# Patient Record
Sex: Female | Born: 1965 | Race: Black or African American | Hispanic: No | Marital: Single | Smoking: Former smoker
Health system: Southern US, Community
[De-identification: ages and names within clinical notes are randomized; demographics above are authoritative.]

## PROBLEM LIST (undated history)

## (undated) DIAGNOSIS — Z940 Kidney transplant status: Secondary | ICD-10-CM

## (undated) DIAGNOSIS — N19 Unspecified kidney failure: Secondary | ICD-10-CM

## (undated) DIAGNOSIS — Z5189 Encounter for other specified aftercare: Secondary | ICD-10-CM

## (undated) DIAGNOSIS — I10 Essential (primary) hypertension: Secondary | ICD-10-CM

## (undated) DIAGNOSIS — K219 Gastro-esophageal reflux disease without esophagitis: Secondary | ICD-10-CM

## (undated) DIAGNOSIS — I509 Heart failure, unspecified: Secondary | ICD-10-CM

## (undated) HISTORY — DX: Unspecified kidney failure: N19

## (undated) HISTORY — PX: COLONOSCOPY: SHX174

## (undated) HISTORY — DX: Kidney transplant status: Z94.0

## (undated) HISTORY — DX: Encounter for other specified aftercare: Z51.89

## (undated) HISTORY — DX: Gastro-esophageal reflux disease without esophagitis: K21.9

## (undated) HISTORY — PX: DG AV DIALYSIS  SHUNT ACCESS EXIST*L* OR: HXRAD910

## (undated) HISTORY — PX: UMBILICAL HERNIA REPAIR: SHX196

## (undated) HISTORY — PX: CHOLECYSTECTOMY: SHX55

---

## 1997-11-01 ENCOUNTER — Other Ambulatory Visit: Admission: RE | Admit: 1997-11-01 | Discharge: 1997-11-01 | Payer: Self-pay | Admitting: *Deleted

## 1997-11-03 ENCOUNTER — Other Ambulatory Visit: Admission: RE | Admit: 1997-11-03 | Discharge: 1997-11-03 | Payer: Self-pay | Admitting: *Deleted

## 1997-12-10 ENCOUNTER — Other Ambulatory Visit: Admission: RE | Admit: 1997-12-10 | Discharge: 1997-12-10 | Payer: Self-pay | Admitting: *Deleted

## 1998-01-12 ENCOUNTER — Other Ambulatory Visit: Admission: RE | Admit: 1998-01-12 | Discharge: 1998-01-12 | Payer: Self-pay | Admitting: *Deleted

## 1998-01-26 ENCOUNTER — Other Ambulatory Visit: Admission: RE | Admit: 1998-01-26 | Discharge: 1998-01-26 | Payer: Self-pay | Admitting: *Deleted

## 1998-07-19 ENCOUNTER — Ambulatory Visit (HOSPITAL_COMMUNITY): Admission: RE | Admit: 1998-07-19 | Discharge: 1998-07-19 | Payer: Self-pay | Admitting: Nephrology

## 1998-07-19 ENCOUNTER — Encounter: Payer: Self-pay | Admitting: Nephrology

## 2000-01-03 ENCOUNTER — Emergency Department (HOSPITAL_COMMUNITY): Admission: EM | Admit: 2000-01-03 | Discharge: 2000-01-03 | Payer: Self-pay | Admitting: Emergency Medicine

## 2000-01-03 ENCOUNTER — Encounter: Payer: Self-pay | Admitting: Emergency Medicine

## 2002-02-25 ENCOUNTER — Encounter (INDEPENDENT_AMBULATORY_CARE_PROVIDER_SITE_OTHER): Payer: Self-pay | Admitting: *Deleted

## 2002-02-25 ENCOUNTER — Encounter: Payer: Self-pay | Admitting: Emergency Medicine

## 2002-02-26 ENCOUNTER — Inpatient Hospital Stay (HOSPITAL_COMMUNITY): Admission: EM | Admit: 2002-02-26 | Discharge: 2002-02-28 | Payer: Self-pay | Admitting: Emergency Medicine

## 2002-02-26 ENCOUNTER — Encounter: Payer: Self-pay | Admitting: Nephrology

## 2002-12-10 ENCOUNTER — Encounter: Admission: RE | Admit: 2002-12-10 | Discharge: 2002-12-10 | Payer: Self-pay | Admitting: Nephrology

## 2002-12-10 ENCOUNTER — Encounter: Payer: Self-pay | Admitting: Nephrology

## 2003-02-04 ENCOUNTER — Encounter (INDEPENDENT_AMBULATORY_CARE_PROVIDER_SITE_OTHER): Payer: Self-pay | Admitting: Specialist

## 2003-02-04 ENCOUNTER — Ambulatory Visit (HOSPITAL_COMMUNITY): Admission: RE | Admit: 2003-02-04 | Discharge: 2003-02-06 | Payer: Self-pay | Admitting: General Surgery

## 2003-02-04 ENCOUNTER — Encounter: Payer: Self-pay | Admitting: General Surgery

## 2003-08-22 ENCOUNTER — Encounter: Admission: RE | Admit: 2003-08-22 | Discharge: 2003-08-22 | Payer: Self-pay | Admitting: Nephrology

## 2003-08-25 ENCOUNTER — Encounter: Admission: RE | Admit: 2003-08-25 | Discharge: 2003-08-25 | Payer: Self-pay | Admitting: Nephrology

## 2003-09-02 ENCOUNTER — Ambulatory Visit (HOSPITAL_COMMUNITY): Admission: RE | Admit: 2003-09-02 | Discharge: 2003-09-02 | Payer: Self-pay | Admitting: Obstetrics

## 2003-11-25 ENCOUNTER — Encounter: Admission: RE | Admit: 2003-11-25 | Discharge: 2003-11-25 | Payer: Self-pay | Admitting: Nephrology

## 2003-12-28 ENCOUNTER — Ambulatory Visit (HOSPITAL_COMMUNITY): Admission: RE | Admit: 2003-12-28 | Discharge: 2003-12-28 | Payer: Self-pay | Admitting: Nephrology

## 2004-12-26 ENCOUNTER — Encounter: Admission: RE | Admit: 2004-12-26 | Discharge: 2004-12-26 | Payer: Self-pay | Admitting: Nephrology

## 2005-07-29 ENCOUNTER — Ambulatory Visit (HOSPITAL_COMMUNITY): Admission: RE | Admit: 2005-07-29 | Discharge: 2005-07-29 | Payer: Self-pay | Admitting: Vascular Surgery

## 2006-06-02 ENCOUNTER — Ambulatory Visit (HOSPITAL_COMMUNITY): Admission: RE | Admit: 2006-06-02 | Discharge: 2006-06-02 | Payer: Self-pay | Admitting: Nephrology

## 2006-07-08 HISTORY — PX: CHOLECYSTECTOMY: SHX55

## 2006-08-08 ENCOUNTER — Ambulatory Visit (HOSPITAL_COMMUNITY): Admission: RE | Admit: 2006-08-08 | Discharge: 2006-08-08 | Payer: Self-pay | Admitting: Vascular Surgery

## 2006-08-08 ENCOUNTER — Ambulatory Visit: Payer: Self-pay | Admitting: Vascular Surgery

## 2006-08-08 ENCOUNTER — Encounter (INDEPENDENT_AMBULATORY_CARE_PROVIDER_SITE_OTHER): Payer: Self-pay | Admitting: Specialist

## 2006-12-22 ENCOUNTER — Encounter: Admission: RE | Admit: 2006-12-22 | Discharge: 2006-12-22 | Payer: Self-pay | Admitting: Nephrology

## 2007-02-21 ENCOUNTER — Inpatient Hospital Stay (HOSPITAL_COMMUNITY): Admission: EM | Admit: 2007-02-21 | Discharge: 2007-02-24 | Payer: Self-pay | Admitting: Emergency Medicine

## 2007-02-21 ENCOUNTER — Ambulatory Visit: Payer: Self-pay | Admitting: Cardiology

## 2007-02-22 ENCOUNTER — Encounter: Payer: Self-pay | Admitting: Nephrology

## 2007-02-23 ENCOUNTER — Encounter (INDEPENDENT_AMBULATORY_CARE_PROVIDER_SITE_OTHER): Payer: Self-pay | Admitting: Nephrology

## 2007-02-23 ENCOUNTER — Ambulatory Visit: Payer: Self-pay | Admitting: Vascular Surgery

## 2007-03-05 ENCOUNTER — Inpatient Hospital Stay (HOSPITAL_COMMUNITY): Admission: EM | Admit: 2007-03-05 | Discharge: 2007-03-07 | Payer: Self-pay | Admitting: Emergency Medicine

## 2007-03-06 ENCOUNTER — Encounter (INDEPENDENT_AMBULATORY_CARE_PROVIDER_SITE_OTHER): Payer: Self-pay | Admitting: Gastroenterology

## 2007-04-29 ENCOUNTER — Ambulatory Visit: Payer: Self-pay | Admitting: Vascular Surgery

## 2007-05-11 ENCOUNTER — Ambulatory Visit: Payer: Self-pay | Admitting: Vascular Surgery

## 2007-05-11 ENCOUNTER — Ambulatory Visit (HOSPITAL_COMMUNITY): Admission: RE | Admit: 2007-05-11 | Discharge: 2007-05-11 | Payer: Self-pay | Admitting: Vascular Surgery

## 2007-07-09 HISTORY — PX: UMBILICAL HERNIA REPAIR: SHX196

## 2007-08-14 ENCOUNTER — Encounter (HOSPITAL_COMMUNITY): Admission: RE | Admit: 2007-08-14 | Discharge: 2007-11-12 | Payer: Self-pay | Admitting: Nephrology

## 2007-09-24 ENCOUNTER — Ambulatory Visit (HOSPITAL_COMMUNITY): Admission: RE | Admit: 2007-09-24 | Discharge: 2007-09-24 | Payer: Self-pay | Admitting: Surgery

## 2007-09-24 ENCOUNTER — Ambulatory Visit: Payer: Self-pay | Admitting: Vascular Surgery

## 2007-11-02 ENCOUNTER — Ambulatory Visit (HOSPITAL_COMMUNITY): Admission: RE | Admit: 2007-11-02 | Discharge: 2007-11-02 | Payer: Self-pay | Admitting: *Deleted

## 2007-11-02 ENCOUNTER — Ambulatory Visit: Payer: Self-pay | Admitting: *Deleted

## 2007-11-10 ENCOUNTER — Emergency Department (HOSPITAL_COMMUNITY): Admission: EM | Admit: 2007-11-10 | Discharge: 2007-11-10 | Payer: Self-pay | Admitting: Emergency Medicine

## 2007-11-27 ENCOUNTER — Ambulatory Visit (HOSPITAL_COMMUNITY): Admission: RE | Admit: 2007-11-27 | Discharge: 2007-11-27 | Payer: Self-pay | Admitting: Nephrology

## 2007-12-18 ENCOUNTER — Ambulatory Visit (HOSPITAL_COMMUNITY): Admission: RE | Admit: 2007-12-18 | Discharge: 2007-12-18 | Payer: Self-pay | Admitting: Vascular Surgery

## 2008-01-20 ENCOUNTER — Ambulatory Visit: Payer: Self-pay | Admitting: Vascular Surgery

## 2008-02-05 ENCOUNTER — Ambulatory Visit: Payer: Self-pay | Admitting: Vascular Surgery

## 2008-02-05 ENCOUNTER — Ambulatory Visit (HOSPITAL_COMMUNITY): Admission: RE | Admit: 2008-02-05 | Discharge: 2008-02-05 | Payer: Self-pay | Admitting: Vascular Surgery

## 2008-02-10 ENCOUNTER — Ambulatory Visit (HOSPITAL_COMMUNITY): Admission: RE | Admit: 2008-02-10 | Discharge: 2008-02-10 | Payer: Self-pay | Admitting: Vascular Surgery

## 2008-04-11 ENCOUNTER — Ambulatory Visit (HOSPITAL_COMMUNITY): Admission: RE | Admit: 2008-04-11 | Discharge: 2008-04-11 | Payer: Self-pay | Admitting: Vascular Surgery

## 2008-04-11 ENCOUNTER — Ambulatory Visit: Payer: Self-pay | Admitting: *Deleted

## 2009-04-07 ENCOUNTER — Ambulatory Visit (HOSPITAL_COMMUNITY): Admission: RE | Admit: 2009-04-07 | Discharge: 2009-04-07 | Payer: Self-pay | Admitting: Nephrology

## 2009-05-03 ENCOUNTER — Ambulatory Visit: Payer: Self-pay | Admitting: Vascular Surgery

## 2009-05-17 ENCOUNTER — Ambulatory Visit (HOSPITAL_COMMUNITY): Admission: RE | Admit: 2009-05-17 | Discharge: 2009-05-17 | Payer: Self-pay | Admitting: Vascular Surgery

## 2009-05-22 ENCOUNTER — Ambulatory Visit: Payer: Self-pay | Admitting: Vascular Surgery

## 2009-05-22 ENCOUNTER — Ambulatory Visit (HOSPITAL_COMMUNITY): Admission: RE | Admit: 2009-05-22 | Discharge: 2009-05-22 | Payer: Self-pay | Admitting: Vascular Surgery

## 2009-07-08 HISTORY — PX: KIDNEY TRANSPLANT: SHX239

## 2009-07-17 ENCOUNTER — Ambulatory Visit (HOSPITAL_COMMUNITY): Admission: RE | Admit: 2009-07-17 | Discharge: 2009-07-17 | Payer: Self-pay | Admitting: Vascular Surgery

## 2009-07-17 ENCOUNTER — Ambulatory Visit: Payer: Self-pay | Admitting: Vascular Surgery

## 2009-08-28 ENCOUNTER — Ambulatory Visit (HOSPITAL_COMMUNITY): Admission: RE | Admit: 2009-08-28 | Discharge: 2009-08-28 | Payer: Self-pay | Admitting: Nephrology

## 2009-09-15 ENCOUNTER — Ambulatory Visit (HOSPITAL_COMMUNITY): Admission: RE | Admit: 2009-09-15 | Discharge: 2009-09-15 | Payer: Self-pay | Admitting: Nephrology

## 2010-04-07 DIAGNOSIS — Z940 Kidney transplant status: Secondary | ICD-10-CM

## 2010-04-07 HISTORY — DX: Kidney transplant status: Z94.0

## 2010-07-28 ENCOUNTER — Encounter: Payer: Self-pay | Admitting: Nephrology

## 2010-07-29 ENCOUNTER — Encounter: Payer: Self-pay | Admitting: Nephrology

## 2010-10-10 LAB — CBC
HCT: 35.8 % — ABNORMAL LOW (ref 36.0–46.0)
Platelets: 214 10*3/uL (ref 150–400)
RBC: 3.44 MIL/uL — ABNORMAL LOW (ref 3.87–5.11)
WBC: 7.4 10*3/uL (ref 4.0–10.5)

## 2010-10-10 LAB — POCT I-STAT 4, (NA,K, GLUC, HGB,HCT)
Glucose, Bld: 84 mg/dL (ref 70–99)
Sodium: 136 mEq/L (ref 135–145)

## 2010-10-10 LAB — BASIC METABOLIC PANEL
Calcium: 10 mg/dL (ref 8.4–10.5)
GFR calc Af Amer: 6 mL/min — ABNORMAL LOW (ref >60)
Potassium: 4 mEq/L (ref 3.5–5.1)

## 2010-10-10 LAB — PROTIME-INR: Prothrombin Time: 13 seconds (ref 11.6–15.2)

## 2010-10-10 LAB — APTT: aPTT: 36 seconds (ref 24–37)

## 2010-11-20 NOTE — Op Note (Signed)
[name], [name]               ACCOUNT NO.:  [id number]   MEDICAL RECORD NO.:  [id number]          PATIENT TYPE:  AMB   LOCATION:  SDS                          FACILITY:  [facility]   PHYSICIAN:  Dorothea Glassman, M.D.    DATE OF BIRTH:  08-19-1965   DATE OF PROCEDURE:  11/02/2007  DATE OF DISCHARGE:  11/02/2007                               OPERATIVE REPORT   SURGEON:  Dorothea Glassman, MD   ASSISTANT:  Nurse.   ANESTHETIC:  Local MAC.   ANESTHESIOLOGIST:  Nelda Severe. Tobias Alexander, MD   PREOPERATIVE DIAGNOSES:  1. End-stage renal failure.  2. Clotted left forearm loop arteriovenous graft.   POSTOPERATIVE DIAGNOSES:  1. End-stage renal failure.  2. Clotted left forearm loop arteriovenous graft.   PROCEDURE:  Ultrasound-guided right internal jugular Diatek catheter.   OPERATIVE PROCEDURE:  The patient was brought to the operating room in  stable condition.  Placed in supine position.  Right neck and chest  prepped and draped in sterile fashion.  Ultrasound of the right neck  revealed a patent right internal jugular vein, normal compressibility,  and respiratory variation.   Skin and subcutaneous tissue were instilled with 1% Xylocaine.  An 18-  gauge needle was induced in the right internal jugular vein.  A 0.035 J-  wire was passed through the needle into the superior vena cava under  fluoroscopy.  __________  11 blade.  A 12 dilator and 16 dilator were  advanced over the guidewire.  A 16 dilator tear-away sheath was advanced  over the guidewire, dilator and guidewire were removed.  Catheter was  placed through the sheath to the superior vena cava and right atrial  junction.  The tear-away sheath was removed.  Subcutaneous tunnel was  created, the catheter was brought to the tunnel, divided, and a hub  mechanism was assembled.  The insertion site was closed with interrupted  3-0 nylon suture.  Catheter was fixed to skin with interrupted 2-0 silk  suture.  Sterile dressings were applied.   Flushed with heparin saline  and capped with heparin solution.  No apparent complications.  The  patient was transferred to recovery room in stable condition.      Dorothea Glassman, M.D.  Electronically Signed    PGH/MEDQ  D:  11/02/2007  T:  11/03/2007  Job:  [id number]

## 2010-11-20 NOTE — Assessment & Plan Note (Signed)
OFFICE VISIT   HAWA, DICECCO  DOB:  May 02, 1966                                       01/20/2008  [id number]   The patient presents for consideration for placement of new hemodialysis  access.  She previously had a left brachiocephalic AV fistula which  lasted 9 years.  She had a right brachiocephalic AV fistula which lasted  several months.  She then most recently had a left forearm AV graft  which lasted about 6 months.   PAST MEDICAL HISTORY:  Significant for hypertension.   MEDICATIONS:  1. Include metoprolol 50 mg twice a day.  2. Phenergan 25 mg as needed.  3. Meclizine 12.5 mg twice a day.  4. Metoprolol 75 mg twice a day.  5. Atrovent inhaler 2 puffs once a day.  6. Nephro-Vite once a day.  7. Renagel 800 mg with meals.  8. PhosLo 667 mg with meals.  9. Sensipar 60 mg with meals.   PHYSICAL EXAMINATION:  Vital signs:  On physical exam blood pressure is  125/94 in the right arm, pulse is 109 and regular.  She has 2+ brachial  and radial pulses bilaterally.  She has a failed left forearm graft and  scars from previous bilateral left brachiocephalic AV fistulas.  She has  no open wounds on the arms.  There are no collaterals on the chest.  She  has a right internal jugular vein Diatek catheter which was put in in  April by Dr. Amedeo Plenty.   I believe the best option for the patient is a central venogram on the  left side to make sure that she does not have a central vein problem  that is causing her access to fail.  If her central veins are patent we  will schedule her in the near future for her left upper arm AV graft.  Central venogram was scheduled for 02/05/2008.  Details, risks, benefits  and possible complications of the procedure were explained to the  patient today.  She understands and wishes to proceed.   Jessy Oto. Fields, MD  Electronically Signed   CEF/MEDQ  D:  01/20/2008  T:  01/21/2008  Job:  [id number]

## 2010-11-20 NOTE — Discharge Summary (Signed)
[name], [name] NO.:  [id number]   MEDICAL RECORD NO.:  [id number]          PATIENT TYPE:  INP   LOCATION:  4703                         FACILITY:  [facility]   PHYSICIAN:  Katha Cabal, M.D.     DATE OF BIRTH:  10-06-1965   DATE OF ADMISSION:  02/21/2007  DATE OF DISCHARGE:                               DISCHARGE SUMMARY   ANTICIPATED DATE OF DISCHARGE:  February 24, 2007.   DISCHARGE DIAGNOSES:  As follows:  1. Syncope, likely secondary to hypovolemia.  2. End-stage renal disease.  3. Nausea, vomiting and diarrhea.  4. Tobacco abuse.  5. Hypertension.  6. History of cardiomyopathy.  7. Question of hyperthyroidism.   DISCHARGE MEDICATIONS:  Include:  1. Norvasc 10 mg p.o. daily.  2. PhosLo 330/5 mg p.o. t.i.d., which is 5 pills t.i.d.  3. Sensipar 60 mg p.o. daily.  4. Lisinopril 40 mg p.o. b.i.d.  5. Meclizine 12.5 mg p.o. b.i.d.  6. Metoprolol 75 mg p.o. b.i.d.  7. Renagel 1600 mg p.o. t.i.d.  8. Atrovent MDI 2 puffs daily.  9. Nephro-Vite 1 pill p.o. daily.  10.Lanoxadil 2.5 mg p.o. b.i.d.   DISPOSITION/FOLLOWUP:  Patient is to resume her hemodialysis schedule at  [facility], which will be on Tuesdays, Thursdays and  Saturdays.  Patient will also follow up with [facility] for a  Holter monitor.  This appointment will be made by [facility].   PROCEDURE PERFORMED:  Include:  1. Patient had an acute abdominal series done on February 21, 2007.      Impressions:  1.  No active lung disease.  2.  No obstruction or      free air.  2. Patient had a CT of the head with contrast on February 22, 2007,      which showed no acute intracranial abnormality.  3. On February 22, 2007, the patient had a failed attempt to open the      clotted right upper arm fistula due to the presence of multiple      tandem of mitral valve stenosis, which did not respond adequately      to mechanical thrombectomy and balloon dilatation.   Subsequently,      patient had placement of a tunnel dialysis catheter for immediate      dialysis needs, in which a 19 cm tip to cuff link catheter was      placed via the right internal jugular vein with the catheter tip      lying in the right atrium.  4. Patient also had bilateral carotid Dopplers done, which showed no      significant bilateral ICA stenosis and vertebral artery was      antegrade.  5. The patient had a 2D echo done, which is pending at the time of      dictation.  This will need to be followed up prior to discharge.   CONSULTATIONS:  [facility] with Dr. Percival Spanish, as well as  interventional radiology who placed the catheter.   BRIEF HISTORY AND PHYSICAL:   CHIEF COMPLAINT:  Nausea/vomiting/diarrhea.  HISTORY OF PRESENT ILLNESS:  Patient is a 45 year old African American  woman with a past medical history significant for end-stage renal  disease, hypertension, AOCD, obesity, tobacco abuse, cardiomyopathy with  an ejection fraction of 20-25%, which was done on echo prior to starting  hemodialysis in 1999 and a most recent EF of 55% in July of 1999,  presenting to the hospital secondary to a syncopal event prior to  thrombectomy of a clot in the right forearm AV fistula.  The patient  reports having a decreased p.o. intake, diarrhea and vomiting 1 day  prior to admission for the entire day.  The patient also complains of  fever and headache.  She denies hematochezia, melena, hemoptysis and  hematemesis.  The patient reports vomiting and diarrhea have resolved.  The patient also complains of an abdominal pain that was diffuse, 10/10,  without radiation, stabbing initially and then cramping, and was  intermittent.  She denies any sick contacts, unusual foods, use of  antibiotics and recent travel.   REVIEW OF SYSTEMS:  Otherwise, negative.   PAST MEDICAL HISTORY:  1. End-stage renal disease secondary to hypertension with hemodialysis      being  started in 1998.  2. Hypertension.  3. AOCD.  4. Secondary hyperparathyroidism.  5. Obesity.  6. Tobacco abuse.  7. History of cardiomyopathy with moderate mitral and tricuspid      regurgitation.  8. History of Trichomonas.  9. Sinusitis.  10.Left ear abscess February of 2001.  11.Medication noncompliance.  12.C-sections x2.  13.Cholecystectomy.  14.Ventral herniorrhaphy in 2004.  15.History of pneumonia.  16.Paroxysmal atrial fibrillation with rapid ventricular response.   ALLERGIES:  INCLUDE NSAIDS, WHICH CAUSE CHEST DISCOMFORT.   MEDICATIONS:  At home include Sensipar, lisinopril, Renagel, PhosLo,  metoprolol, Norvasc and Clonidine.   FAMILY HISTORY:  Mom with hypertension and diabetes.  Dad with  hypertension.  Sister with CHF and another sister with hypertension.   SOCIAL HISTORY:  Patient currently lives in [place].  She has 2  healthy children and she is disabled.  She has a 20 year 1/2 a pack a  day of tobacco abuse.  She denies alcohol and illicit drugs.   PHYSICAL EXAMINATION:  VITAL SIGNS:  Temperature equal 97.7.  Pulse  equals 67.  Respiratory rate equals 20.  Blood pressure equals 95/54.  O2 saturations equals 100% on room air.  Ins and outs, on August 16, is  1590/0.  GENERAL:  In no acute distress.  CV:  S1, S2.  Regular rate and rhythm, positive murmur, decreased heart  sounds.  LUNGS:  Clear to auscultation bilaterally.  ABDOMEN:  Soft, diffuse, mild tenderness, no guarding, no rebound,  decreased breath sounds.  NEURO:  Alert, nonfocal, cranial nerves II-XII intact, motor intact,  sensation intact.  HEENT:  AT, [place].  Moist oral mucosa.  No LAD.  EXTREMITIES:  Mild pitting edema.   LABORATORY DATA:  At admission, includes lipase equal 23, sodium 141,  potassium 3.8, chloride 97, bicarb 25, BUN 44, creatinine 11.95, glucose  103, bilirubin total equals 1.0, alkaline phosphatase equals 71, AST  equals 16, ALT equals 15, total protein equals 7.6,  albumin equals 3.7,  calcium equals 9.8.  Point of care markers:  CK-MB equals 6.8, troponin  is less than 0.05, myoglobin greater than 500.  WBC equals 11.1,  hemoglobin equals 15.8, platelets equal 205, MCV equals 101.2, RDW  equals 15.2, ANC equals 8.6.  Patient also had acute abdominal series,  which showed no active  lung disease.  No obstruction or free air.   HOSPITAL COURSE:  Includes:  1. Syncope.  Patient's syncope, most likely secondary to hypovolemia      secondary to nausea, vomiting and diarrhea.  Of question is      possible heart arrhythmia given patient became bradycardic during      hemodialysis and while on the floor with a low rate of 43.  At time      of discharge, patient's 2D echocardiogram is pending.  Carotid      Dopplers were checked, which were negative for stenosis.  Patient      also had a CT of the head, which showed no acute abnormalities.      Patient did not have any further syncopal episodes during her      hospital stay.  She was given meclizine and reported that she felt      much better.  Secondary to patient's heart rate dropping down into      the low 40s, cardiology was consulted given her history of      cardiomyopathy.  Cardiology wants to see her as an outpatient to do      a Holter monitor.  We will also need to follow up with patient's 2D      echocardiogram.  Patient had cardiac enzymes done, as well, which      showed a mild bump in troponin.  First set of cardiac enzymes      revealed CK was 135, CK-MB equals 8.6, troponin equals 0.08.      Second set of cardiac enzymes showed CK equals 154, CK-MB equals      8.0, troponin equals 0.07.  The patient did not have any further      syncopal episodes or chest pain after initial event.  2. End-stage renal disease.  Patient had a clogged right upper      extremity fistula.  She presented for declotting of the procedure,      which was unsuccessful.  As a result, the patient had placement of       a tunnel dialysis catheter with ultrasound and flora guidance to      the right internal jugular vein.  The patient will be discharged      and will maintain her usual Tuesday, Thursday and Saturday      schedule.  No changes were made to any of her dialysis medications      during this hospital stay.  3. Nausea, vomiting and diarrhea.  Patient's nausea, vomiting and      diarrhea have resolved at the time of discharge.  It is most likely      secondary to a viral gastroenteritis.  The patient was given      Imodium for her diarrhea during hospital stay.  4. Tobacco abuse.  The patient received a smoking cessation consult      during hospital stay.  5. Hypertension.  Patient had good control during her hospital stay.      However, due to her episodes of bradycardia, her clonidine will be      stopped at this time.  Patient's other 4 hypertensive medications      were restarted at time of discharge.  This will need to be      addressed in the outpatient setting.  If patient continues to have      episodes of bradycardia, patient's metoprolol may need to be      decreased.  We will leave this to the discretion to the      cardiologist when they see her for a Holter monitor test.  6. History of cardiomyopathy.  We will follow results of the 2D      echocardiogram.  We will continue patient's current medications.  7. Questionable hyperthyroidism.  Patient had a TSH checked during her      hospital stay, which was normal at 0.597.   DISCHARGE LABS AND VITALS:  Include, labs from February 23, 2007, include  sodium 133, potassium 4.0, chloride 95, bicarb 24, glucose 117, BUN 73,  creatinine 15.58, albumin 3.0, calcium 8.4, phosphorus 7.1.  As you may  know these labs were done prior to hemodialysis.  The patient also had a  pre-hemodialysis CBC done on August 18 with a WBC of 4.9, hemoglobin  10.9, hematocrit 31.6, MCV 99.2, RDW 14.5, and platelets of 134.   Discharge vitals include temperature  of 97.2, pulse 49, respiratory rate  20, blood pressure 156/95, O2 saturations 100% on room air.      Katha Cabal, M.D.  Electronically Signed     JY/MEDQ  D:  02/24/2007  T:  02/24/2007  Job:  [id number]   cc:   Minus Breeding, MD, [facility]

## 2010-11-20 NOTE — Procedures (Signed)
CEPHALIC VEIN MAPPING   INDICATION:  Preoperative vein mapping.   HISTORY:  Multiple bilateral upper extremity dialysis access sites.   EXAM:   The right cephalic vein is not compressible from the mid brachium to  antecubital fossa levels.   Diameter measurements range from 0.32 to 0.39 cm.   The right basilic vein is not compressible from the distal brachium to  forearm level.   There is a totally occluded right brachial-to-brachial upper arm AV  graft noted.   The left cephalic vein has been previously used for an AV fistula.   The left basilic vein was not adequately visualized.   See attached worksheet for all measurements.   IMPRESSION:  1. Thrombosed right cephalic and basilic veins noted, as described      above.  2. Totally occluded right upper arm AV graft noted.   ___________________________________________  Jessy Oto Fields, MD   CH/MEDQ  D:  05/03/2009  T:  05/03/2009  Job:  [id number]

## 2010-11-20 NOTE — Consult Note (Signed)
NAMEIRMALEE, MOUND               ACCOUNT NO.:  [id number]   MEDICAL RECORD NO.:  [id number]          PATIENT TYPE:  INP   LOCATION:  4703                         FACILITY:  [facility]   PHYSICIAN:  Minus Breeding, MD, FACCDATE OF BIRTH:  1965/09/28   DATE OF CONSULTATION:  02/23/2007  DATE OF DISCHARGE:                                 CONSULTATION   PRIMARY:  Dr. Marval Regal   REASON FOR PRESENTATION:  Evaluate patient with syncope.   HISTORY OF PRESENT ILLNESS:  We are consulted for evaluation of syncope.  The patient is a pleasant 45 year old [place] American female who  apparently had a past history of dilated cardiomyopathy.  There is some  mention of an EF of 25% in the past.  The patient does not recall this  and I do not know the details.  She apparently had resolution of this.  She has had longstanding hypertension and may have had cardiomyopathy  related to this.  She is dialysis dependent with renal failure related  to the hypertension.   She has gotten along well on dialysis.  She has never had any prior  cardiac workup that she can recall.  She has never had palpitations  unless she exerts herself and she will feel her heart racing.  However,  this does not bother her, is chronic.  She denies presyncope or syncope.  She has not had any shortness of breath and denies any PND or orthopnea.  She has not had any chest discomfort, neck or arm discomfort.   She developed a clotted dialysis graft.  She was in admitting.  The plan  was to bring her in to declot the graft.  She was sitting in a chair.  She stood up and had an apparent syncopal episode.  I do not see amu  written documentation of this event.  I do see in the emergency room she  was noted to have systolics in the [id number].  I do not see orthostatics  classically, though lying and seated done at different times were not  significant for a dramatic drop.  There were no apparent acute  arrhythmias noted.  Since admission  the patient has had some episodes of  sinus bradycardia.  There have been some dropped sinus beats that may  have been slightly premature and nonconducted PACs.  There have been no  prolonged pauses.  She has had no further presyncope or syncopal events.   PAST MEDICAL HISTORY:  1. End-stage renal disease, dialysis dependent.  2. Hypertension.  3. Secondary hyperparathyroidism.  4. Obesity.  5. Tobacco abuse.  6. Paroxysmal atrial fibrillation (the patient denies this).  7. Sinusitis.  8. Left ear abscess.   PAST SURGICAL HISTORY:  1. C-section x2.  2. Cholecystectomy.  3. Ventral herniorrhaphy.  4. 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  5. Right arm dialysis graft.   ALLERGIES:  NONSTEROIDALS.   MEDICATIONS:  1. Lisinopril 40 mg b.i.d.  2. Norvasc 10 mg daily.  3. Minoxidil 2.5 mg b.i.d.  4. Catapres  0.2 mg b.i.d.  5. Lopressor 75 mg b.i.d.  6. Cinacalcet.  7. Meclizine.  8. Calcium.  9. Renagel.  10.Protonix.   SOCIAL HISTORY:  The patient lives in [place] with her teenage  children.  She continues to smoke a half a pack of cigarettes a day and  has smoked for 20 years.  She is disabled.  She does not drink alcohol.   FAMILY HISTORY:  Is contributory for hypertension and diabetes in  multiple relatives.   REVIEW OF SYSTEMS:  As stated in the HPI, positive for recent URI and GI  complaints of diarrhea, headaches.  Negative for other systems.   PHYSICAL EXAMINATION:  The patient is in no acute distress.  Blood  pressure 150/81, heart rate 74 and regular, afebrile.  HEENT:  Eyelids unremarkable.  Pupils equal, round and react to light.  Fundi not visualized.  Oral mucosa unremarkable.  NECK:  No jugular distension, waveform within normal limits.  Carotid  upstroke brisk and symmetrical.  No bruits, no thyromegaly.  LYMPHATICS:  No cervical, axillary or inguinal adenopathy.  LUNGS:  Clear to  auscultation bilaterally.  BACK:  No costovertebral angle tenderness.  CHEST:  Unremarkable.  HEART:  PMI not displaced or sustained.  S1 and S2 within normal.  No  S3, no S4.  No clicks, rubs, murmurs.  ABDOMEN:  Flat, positive bowel sounds normal in frequency and pitch.  No  bruits, rebound, guarding.  No midline pulsatile mass.  No hepatomegaly,  no splenomegaly.  SKIN:  No rashes, no nodules.  EXTREMITIES:  Show 2+ pulses, clotted dialysis graft, no cyanosis or  clubbing.  NEUROLOGIC:  Oriented to person, place, and time.  Cranial nerves II-XII  grossly intact.  Motor grossly intact.   EKG (August 16):  Low atrial rhythm, left axis deviation, QT prolonged.   Echocardiogram results pending.   Head CT:  No acute intracranial abnormalities.   Labs:  WBC 4.9, hemoglobin 10.9.  Sodium 133, potassium 4.0, BUN 73,  creatinine 15.5.   ASSESSMENT AND PLAN:  1. Syncope.  The patient had a syncopal episode while standing up from      a chair.  She was hypotensive.  This most likely represents an      orthostatic or vasovagal issue.  It seems to be a one-time event.      I do not see any evidence of bradyarrhythmias.  I do not see any      evidence of sustained tachyarrhythmias.  She does have a prolonged      QT but I doubt that this is contributing.  At this point, I will      get an echocardiogram as she has had a reduced ejection fraction in      the past.  I will follow this with an outpatient Holter monitor.      She can continue on telemetry while she is here.  2. Mildly elevated troponin.  The patient had a very slight elevated      troponin but no diagnostic CK-MB bump.  Will look for a wall motion      abnormality on echocardiogram.  With this nonspecific finding in      the absence of symptoms, I would not suggest further testing.  3. Prolonged QT.  Again, I do not think this is contributing to her      episode of syncope.  She should avoid all QT-prolonging drugs and       can be given this  literature.  Again, will monitor her with a      Holter 48 hours and certainly pursue further evaluation with any      recurrent syncope in the future.  4. Cardiomyopathy.  She will get a repeat echocardiogram.  5. Followup.  I will see her back during this hospitalization and      again in the office.      Minus Breeding, MD, [facility]  Electronically Signed     JH/MEDQ  D:  02/23/2007  T:  02/24/2007  Job:  [id number]

## 2010-11-20 NOTE — Op Note (Signed)
NAMEGIRTHA, [name]               ACCOUNT NO.:  [id number]   MEDICAL RECORD NO.:  [id number]          PATIENT TYPE:  AMB   LOCATION:  SDS                          FACILITY:  [facility]   PHYSICIAN:  Nelda Severe. Kellie Simmering, M.D.  DATE OF BIRTH:  12-16-1965   DATE OF PROCEDURE:  12/17/2007  DATE OF DISCHARGE:  12/18/2007                               OPERATIVE REPORT   PREOPERATIVE DIAGNOSIS:  End-stage renal disease.   POSTOPERATIVE DIAGNOSIS:  End-stage renal disease.   No surgery performed.   PROCEDURE:  Surgery cancelled.   SURGEON:  Nelda Severe. Kellie Simmering, MD.   The patient was taken to the operating room, given Ancef 1 g  intravenously, and this was followed by nausea, vomiting, and  tachycardia.  The patient was observed for 20-30 minutes and continued  to have symptoms with and therefore her surgery was cancelled.  She was  returned to the postanesthesia care unit, will be rescheduled in the  future.      Nelda Severe Kellie Simmering, M.D.  Electronically Signed     JDL/MEDQ  D:  12/18/2007  T:  12/18/2007  Job:  [id number]

## 2010-11-20 NOTE — Op Note (Signed)
[name], [name]               ACCOUNT NO.:  [id number]   MEDICAL RECORD NO.:  [id number]          PATIENT TYPE:  AMB   LOCATION:  SDS                          FACILITY:  [facility]   PHYSICIAN:  Judeth Cornfield. Scot Dock, M.D.DATE OF BIRTH:  09/12/1965   DATE OF PROCEDURE:  02/10/2008  DATE OF DISCHARGE:                               OPERATIVE REPORT   PREOPERATIVE DIAGNOSIS:  End-stage renal disease.   POSTOPERATIVE DIAGNOSIS:  End-stage renal disease.   PROCEDURE:  Placement of a new right upper arm loop arteriovenous graft  (4- to 7-mm tapered polytetrafluorethylene graft).   SURGEON:  Judeth Cornfield. Scot Dock, MD   ASSISTANT:  Chad Cordial, PA   Anesthesia is local with sedation.   HEMODIALYSIS ACCESS PLANNING:  This patient had an upper arm loop graft  placed because they were found to have a high bifurcation of the  brachial artery.   TECHNIQUE:  The patient was taken to the operating room and sedated by  anesthesia.  The right upper extremity was prepped and draped in the  usual sterile fashion.  After the skin was infiltrated with 1%  lidocaine, a longitudinal incision was made above the antecubital level  where the patient had had a previous upper arm fistula placed.  Here the  artery was dissected free and was quite small.  Upon further  interrogation, there was radial and ulnar branch and thus the patient  had a high bifurcation of the brachial artery.  The adjacent veins were  also fairly small.  Given that the patient had a high bifurcation of the  brachial artery, I elected to place an upper arm loop graft.  A separate  longitudinal incision was made beneath the axilla after the skin was  anesthetized.  The high brachial vein was dissected free and was a much  larger vein, and the adjacent brachial vein was also a good size.  A 4-  to 7-mm graft was then tunneled in a loop fashion in the upper arm with  the arterial aspect of the graft along the lateral aspect of  the arm.  The patient was then heparinized.  The brachial artery was clamped  proximally and distally and a longitudinal arteriotomy was made.  A  segment of the 4 mm of the graft was excised.  The graft spatulated and  sewn end-to-side to the artery using continuous 6-0 Prolene suture.  The  graft was then found to be appropriate length for anastomosis of the  high brachial vein.  The vein was ligated distally and spatulated  proximally.  The graft was cut to the appropriate length, spatulated,  and sewn end-to-end to the vein using continuous 6-0 Prolene suture.  At  the completion, there was an excellent thrill in the graft.  Hemostasis  was obtained in the wounds.  The wounds were  closed with a deep layer of 3-0 Vicryl and the skin closed with 4-0  Vicryl.  Sterile dressing was applied.  The patient tolerated the  procedure well and was transferred to the recovery room in satisfactory  condition.  All needle and sponge  counts were correct.      Judeth Cornfield. Scot Dock, M.D.  Electronically Signed     CSD/MEDQ  D:  02/10/2008  T:  02/11/2008  Job:  [id number]

## 2010-11-20 NOTE — H&P (Signed)
[name], [name] NO.:  [id number]   MEDICAL RECORD NO.:  [id number]          PATIENT TYPE:  INP   LOCATION:  4703                         FACILITY:  [facility]   PHYSICIAN:  Katha Cabal, M.D.     DATE OF BIRTH:  June 01, 1966   DATE OF ADMISSION:  02/21/2007  DATE OF DISCHARGE:                              HISTORY & PHYSICAL   CHIEF COMPLAINT:  Syncope/vomiting/diarrhea.   HISTORY OF PRESENT ILLNESS:  Patient is a 45 year old African-American  woman with a past medical history significant for ESRD, hypertension,  AOCD, obesity, tobacco abuse, cardiomyopathy with an EF of 20 to 25% on  an echo prior to hemodialysis in 1999 and a most recent EF of 55% in  July of 1999 presenting to the hospital secondary to syncopal event  prior to thrombectomy of a clot in the right forearm AV fistula.  Patient reports having decreased p.o. intake, diarrhea, and vomiting one  day prior to admission for the entire day.  Patient also complains of  fever and headache.  She denies hematochezia, melena, hemoptysis and  hematemesis.  Patient reports vomiting and diarrhea have resolved.  Patient also complains of abdominal pain that was diffuse, 10/10,  without radiation, stabbing initially and then cramping, and was  intermittent.  She denies any sick contacts, unusual foods, recent  antibiotics, and recent travel.   REVIEW OF SYSTEMS:  Otherwise negative.   PAST MEDICAL HISTORY:  1. ESRD secondary to hypertension with hemodialysis being started in      1998.  2. Hypertension.  3. AOCD.  4. Secondary hyperparathyroidism.  5. Obesity.  6. Tobacco abuse.  7. History of cardiomyopathy and moderate mitral and tricuspid      regurgitation.  8. History of Trichomonas.  9. Sinusitis.  10.Left ear abscess February of 2001.  11.Noncompliance.  12.C-section x2.  13.Cholecystectomy.  14.Ventral herniorrhaphy 2004.  15.History of pneumonia.  16.Paroxysmal atrial fibrillation with  RVR.   ALLERGIES:  INCLUDE NSAID WHICH CAUSES CHEST DISCOMFORT AND STOMACH  PAIN.   MEDICATIONS:  Include:  1. Sensipar.  2. Lisinopril.  3. Renagel.  4. PhosLo.  5. Metoprolol.  6 .  Norvasc.  1. Clonidine.   FAMILY HISTORY:  Mom with hypertension and diabetes.  Dad with  hypertension.  Sister with CHF, another sister with hypertension.   SOCIAL HISTORY:  Patient has two healthy children, is single, lives in  [place], is disabled.  Patient has a 20-year with a half-a-pack per  day history of tobacco.  She denies alcohol and illicit drugs.   PHYSICAL EXAMINATION:  VITALS:  Temperature = 97.7, pulse = 67,  respiratory rate = 20, blood pressure = 95/54, O2 sats = 100% on room  air.  Ins and outs on August 16 is 1590 in/0 out.  GENERAL:  No acute distress.  CV:  S1, S2, regular rate and rhythm, positive murmur, decreased heart  sounds.  LUNGS:  CTA bilaterally.  ABDOMEN:  Soft, diffuse, mild tenderness, no guarding, no rebound,  decreased bowel sounds.  NEURO:  Alert, nonfocal, cranial nerves II-XII intact, motor intact,  sensation intact.  HEENT:  AT, [place], moist oral mucosa, no LAD.  EXTREMITIES:  Mild pedal edema.   LABS:  Lipase = 23, sodium = 141, potassium 3.8, chloride 97, bicarb 25,  BUN 44, creatinine 11.95, glucose 103, bilirubin total = 1.0, alk phos =  71, AST = 16, ALT = 15, total protein = 7.6, albumin = 3.7, calcium =  9.8.  Point-of-care markers:  CK-MB = 6.8, troponin is less than 0.05,  myoglobin greater than 500.  WBC = 11.1, hemoglobin = 15.8, platelets =  205, MCV = 101.2, RDW = 15.2, ANC = 8.6.   ACUTE ABDOMINAL SERIES:  1. No active lung disease.  2. No obstruction or free air.   ASSESSMENT/PLAN:  1. Syncope.  Likely secondary to volume depletion secondary to      diarrhea and vomiting which is likely secondary to viral      gastroenteritis.  Patient reports feeling better with no further      vomiting and diarrhea.  Will check a 2-D echo and  cardiac enzymes.      Also, will check a CT of the head to rule out TIA and stroke      secondary to patient's reported headache.  Will give IV fluids to      re-hydrate.  Will hold antihypertensives at this point.  2. Nausea, vomiting, diarrhea.  Likely secondary to viral      gastroenteritis.  Will give Zofran p.r.n.  Patient denies further      episodes currently.  Will give IV fluids.  Lipase has been checked      and is normal.  An acute abdominal series was also checked which      was normal.  3. Hypertension.  Will hold antihypertensives.  4. End-stage renal disease.  Patient will get a thrombectomy on February 22, 2007 of the clotted AV fistula.  Patient is normally in      Tuesday, Thursday, Saturday hemodialysis at [facility].  Patient will      likely need hemodialysis on Sunday after her thrombectomy.  Will      continue Sensipar, Renagel and PhosLo.  5. Tobacco abuse.  Will get a smoking cessation consult.  6. History of cardiomyopathy.  Will get a 2-D echo.  7. Prophylaxis.  Will start Protonix/SCDs.  8. Questionable hyperthyroidism.  Will check a TSH.      Katha Cabal, M.D.  Electronically Signed     JY/MEDQ  D:  02/21/2007  T:  02/22/2007  Job:  [id number]

## 2010-11-20 NOTE — Op Note (Signed)
[name], [name]               ACCOUNT NO.:  [id number]   MEDICAL RECORD NO.:  [id number]          PATIENT TYPE:  AMB   LOCATION:  SDS                          FACILITY:  [facility]   PHYSICIAN:  Jessy Oto. Fields, MD  DATE OF BIRTH:  [birth year]   DATE OF PROCEDURE:  DATE OF DISCHARGE:                               OPERATIVE REPORT   PROCEDURE:  Bilateral upper extremity central venogram.   PREOPERATIVE DIAGNOSIS:  Subclavian vein stenosis.   POSTOPERATIVE DIAGNOSIS:  Subclavian vein stenosis.   ANESTHESIA:  Local.   INDICATIONS:  The patient is a 45 year old female with end-stage renal  disease.  She has had multiple failed access procedures in the left  upper extremity and the right upper extremity.  She presents today for  central venogram for access planning purposes.  She also presents to  rule out any central vein stenosis as a possible cause of her access  failures.   OPERATIVE DETAILS:  After obtaining informed consent, the patient was  brought to the [facility] lab.  The patient was placed in supine position on the  angio table.  A preexisting left hand IV was accessed after a sterile  prep and drape.  A contrast angiogram was then performed through the  left hand intravenous catheter.  This shows a patent deep brachial vein  and axillary venous system.  Multiple views were obtained of the left  subclavian vein at the level that it crosses the clavicle.  There is a  tapered narrowing of the left subclavian vein just as it crosses the  clavicle with a few collaterals around this.  The stenosis was  approximately 50%.   At this point, it was decided to also obtain central venogram from the  right side to see if the right upper extremity will be a better access  choice.  The right arm was inspected with ultrasound to find a suitable  vein to cannulate.  There were no obvious veins in the hand.  There was  a forearm vein and attempt was made to cannulate this unsuccessfully.  At  this point, attempts to start an IV in a peripheral vein in the upper  extremity were aborted.  Next, the patient's right groin was prepped and  draped or right groin was prepped and draped in usual sterile fashion.  Local anesthesia was infiltrated over the right common femoral vein.  A  Majestic needle was used to cannulate the right common femoral vein and  a 0.035 Wholey wire advanced into the inferior vena cava under  fluoroscopic guidance.  Next, a 5-French sheath was placed over the  guidewire in the right common femoral vein.  This was thoroughly flushed  with heparinized saline.  A Wholey wire was then advanced up into the  inferior vena cava up to level of the right atrium.  A 5-French H1  catheter was threaded over this.  The guidewire was advanced out into  the right subclavian vein with minimal difficulty.  The H1 catheter was  then advanced over this.  Contrast angiogram was then obtained of the  right subclavian  system.  This shows a widely patent right subclavian  system from the distal axillary vein all the way into the central veins.  There is minimal obstruction caused from a right internal jugular vein  venous catheter.   At this point, the catheter was pulled back over a guidewire.  The 5-  French sheath was then removed and hemostasis obtained with direct  pressure.  The patient tolerate the procedure well and there were no  complications.   OPERATIVE FINDINGS:  1. 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  2. Widely patent right central venous subclavian and innominate      system.      Jessy Oto. Fields, MD  Electronically Signed     CEF/MEDQ  D:  02/05/2008  T:  02/06/2008  Job:  [id number]

## 2010-11-20 NOTE — Consult Note (Signed)
NAMECHAZLYN, [name]               ACCOUNT NO.:  [id number]   MEDICAL RECORD NO.:  [id number]          PATIENT TYPE:  INP   LOCATION:  5531                         FACILITY:  [facility]   PHYSICIAN:  Tory Emerald. Benson Norway, MD    DATE OF BIRTH:  [birth year]   DATE OF CONSULTATION:  03/05/2007  DATE OF DISCHARGE:                                 CONSULTATION   REASON FOR CONSULTATION:  Abdominal pain, vomiting, diarrhea and  syncope.   REFERRING PHYSICIAN:  Donato Heinz, M.D.   HISTORY OF THE PRESENT ILLNESS:  This is a 45 year old female with a  past medical history of end-stage renal disease secondary to  hypertension, obesity, cardiomyopathy with an EF of 20 and 25%,  secondary hyperparathyroidism,  obesity, ventral hernia repair in 2004,  and status post cholecystectomy who was admitted to the hospital with  complaints of nausea, vomiting, abdominal pain and subsequent syncope.  The patient states that she has had these symptoms that have been  intermittent over the past several months, but apparently they have now  become markedly worsen at this time in that the frequency and intensity  are  much worse than previously.  She denied having any significant  syncopal episodes, but at this time this apparently is a new  development.  She was admitted recently on February 21, 2007 with similar  types of symptoms, but those symptoms quickly abated after the first day  of her hospitalization.  It was felt at that time that she was  dehydrated.  The patient states that her pain is generalized and is  sharp with an ache type sensation and it can last for several minutes,  however, or subsequently she will have diarrhea.  On one occasion she  had a near syncopal episode and recently she did have a complete  syncopal episode.  The patient denies any hematochezia or melena, and no  hematemesis.  The pain  is not necessarily treated with by mouth intake;  however, it appears to be exacerbated with  eating.  She denies any  history of a hypercoagulable state.   PAST MEDICAL AND SURGICAL HISTORY:  The past medical history and past  surgical history are as stated above.   FAMILY HISTORY:  The family history is noncontributory.   SOCIAL HISTORY:  The social history is negative for alcohol and illicit  drug use; positive for a half-pack per day of tobacco.   ALLERGIES:  Allergy to NSAIDS.   MEDICATIONS:  1. Meclizine 12  mg by mouth twice a day.  2. Phenergan 12.5 mg by mouth every 6 hours.  3. Metoprolol 25 mg by mouth twice a day.  4. Albuterol MDI.  5. Protonix 40 mg by mouth every day.  6. Nephro-Vite 1 tablet by mouth every day.  7. PhosLo 5 tablets by mouth q.a.c.  8. Renagel 800 mg 2 tablets by mouth  q.a.c.  9. Sensipar 60 mg by mouth every day.   PHYSICAL EXAMINATION:  VITAL SIGNS:  The vital signs are pending this at  this time.  GENERAL APPEARANCE:  Generally the patient is in  no acute distress, and  is alert and oriented.  HEENT:  Normocephalic and atraumatic.  Extraocular muscles are intact.  NECK:  The neck is supple.  No lymphadenopathy.  LUNGS:  The lungs are clear to auscultation bilaterally.  HEART:  Cardiovascular - regular rate and rhythm.  ABDOMEN:  The abdomen is obese and soft with marked tenderness in the  right lower quadrant region, and some tenderness in the epigastrium and  left upper quadrant.  EXTREMITIES:  No clubbing, cyanosis, cyanosis or edema.  RECTAL:  The rectal examination is negative for any masses; and, is heme-  negative   LABORATORY VALUES:  White blood cell count is 11.8, hemoglobin 15.3, MCV  is 100.2, and platelet count is 186,000.  Sodium 135, potassium 4.3,  chloride 102, glucose 89, BUN 52, and creatinine 13.8.   IMPRESSION:  Nausea, vomiting and syncope.  She has a diverse number  symptoms and if she had any types of gastric intestinal types of  surgeries a short gut  dumping syndrome would be a consideration;  however,  this is not the issue in this patient.  Other considerations  can be an adrenal insufficiency as well as ischemia and there might be a  possibility of amyloidosis, which can result in similar types of  symptoms.  This is a possibility as the patient does have a long history  of end-stage renal disease and secondary amyloidosis could developed.   The physical examination was significant for marked abdominal pain in  the right lower quadrant region.  There was no rebound or rigidity.  I  am uncertain about the cause; however, patients with renal disease can  have ischemia to the right side of the colon.   Further evaluation with an esophagogastroduodenoscopy and colonoscopy is  warranted at this time.  I do agree with having the patient undergo a CT  scan.  Additionally, I will also order a lactic acid level and we will  also wait for an ACTH level on the patient.      Tory Emerald Benson Norway, MD  Electronically Signed     PDH/MEDQ  D:  03/05/2007  T:  03/07/2007  Job:  [id number]

## 2010-11-20 NOTE — Assessment & Plan Note (Signed)
OFFICE VISIT   [name], [name]  DOB:  [birth year]                                       04/29/2007  [id number]   [name] presents today for evaluation for placement on a new  hemodialysis access.  She previously had a left brachycephalic AV  fistula which lasted approximately 9 years.  She then subsequently had a  right brachial cephalic AV fistula placed which lasted less than a year.  She is currently dialyzing by a Diatek catheter.   PHYSICAL EXAMINATION:  Blood pressure is 143/85, pulse is 75 and  regular.  She has 2+ brachial radial pulses bilaterally.  She has  multiple scars in the left arm and right arm from previous bilateral  brachial cephalic AV fistulas.  She has no obvious visible veins for  further fistula creation.   PAST MEDICAL HISTORY:  Remarkable for hypertension.  She denies any  recent chest pain, shortness of breath, GI bleeding, TIA, stroke, or  syncopal episodes. She has had no previous problems with hypercoagulable  state or breathing difficulties.   I believe the best option for [name] is going to be placing the  left arm AV graft since she is right hand dominant.  I advised her today  that if the veins are small in the antecubital region that we would  place a left upper arm graft as opposed to a left forearm graft.  Will  make this decision after evaluation in the operating room.  I advised  her today of the risks, benefits and possible complications of the  procedure including but not limited to bleeding, infection, graft  thrombosis steal.  She understands and agrees to proceed.  Her left arm  AV graft is scheduled for 05/11/2007.   MEDICATIONS:  1. Metoprolol 50 mg twice a day.  2. Phenergan 25 mg p.r.n.  3. Meclizine 12.5 mg twice a day.  4. Metoprolol 75 mg twice a day.  5. Atrovent inhaler 2 puffs once a day.  6. Nephrovite 1 q. day.  7. Renagel 800 mg with meals.  8. PhosLo 67 mg with meals.  9.  Sensipar 60 mg with meals.   Tracey Oto. Fields, MD  Electronically Signed   CEF/MEDQ  D:  04/30/2007  T:  05/01/2007  Job:  [id number]

## 2010-11-20 NOTE — Op Note (Signed)
[name], [name]               ACCOUNT NO.:  [id number]   MEDICAL RECORD NO.:  [id number]          PATIENT TYPE:  AMB   LOCATION:  SDS                          FACILITY:  [facility]   PHYSICIAN:  Jessy Oto. Fields, MD  DATE OF BIRTH:  1966/03/08   DATE OF PROCEDURE:  09/24/2007  DATE OF DISCHARGE:  09/24/2007                               OPERATIVE REPORT   PROCEDURE:  Thrombectomy and revision, left forearm arteriovenous graft.   PREOPERATIVE DIAGNOSIS:  Thrombosed arteriovenous graft.   POSTOPERATIVE DIAGNOSIS:  Thrombosed arteriovenous graft.   ANESTHESIA:  Local with IV sedation.   ASSISTANT:  Tanya Nones, RNFA   OPERATIVE FINDINGS:  Diffusely diseased venous outflow vein revised to  adjacent deep brachial system.   OPERATIVE DETAILS:  After obtaining informed consent, the patient was  taken to the operating room.  The patient was placed in supine position  on the operating table.  After adequate sedation, the patient's entire  left upper extremity was prepped and draped in the usual sterile  fashion.  Local anesthesia was infiltrated at a preexisting longitudinal  scar just above the antecubital crease.  Incision was reopened and  carried down through subcutaneous tissues down to the level of the  graft.  The graft was dissected free circumferentially.  Previous  anastomosis had been end-to-side to the vein.  This was dissected free  circumferentially.  It should be noted that the venous limb of the graft  was on the radial aspect of the forearm.  Dissection was carried up onto  a more normal-appearing segment of vein.  This was dissected free  circumferentially.  Next the patient was given 5000 units of intravenous  heparin.  A transverse graftotomy was made just above the level of the  venous anastomosis.  Number 4 Fogarty catheter was used to  thrombectomize the venous limb of the graft.  Multiple passes were made  with an initial return of some thrombus but minimal  backbleeding.  The  anastomosis was probed and found to be patent.  However, and there was  diffuse narrowing of the vein above this and it would not accept a 3-mm  dilator.  The vein was then opened longitudinally above the anastomosis.  It was diffusely disease with multiple areas of narrowing and intimal  hyperplasia.  The vein was ligated proximally and distally in the  anastomosis taken down.  Next an adjacent deep brachial vein was  identified.  This was approximately 3 mm in diameter.  This was  dissected free circumferentially.  It was controlled proximally and  distally with fine bulldog clamps.  The arterial limb of graft was then  thrombectomized with a number 4 Fogarty catheter.  Multiple passes were  made and all thrombotic material was removed and arterial line plug was  retrieved.  This was then clamped proximally.  A 6-mm PTFE graft was  brought up to the operative field.  A longitudinal opening was made in  the deep brachial vein and the graft was spatulated and sewn end graft  to side of vein using a running 6-0 Prolene suture.  At completion of  the anastomosis, this was thoroughly flushed with heparinized saline.  There was good backbleeding from the vein.  Next the graft was cut to  length and anastomosed to the proximal arterial limb of graft end-to-end  using running 6-0 Prolene suture.  Just prior to completion of  anastomosis, this was fore bled, back bled and thoroughly flushed.  Anastomosis was secured, clamps were released, there was a palpable  thrill above the graft immediately.  The patient also a palpable radial  pulse.  Next hemostasis was obtained with thrombin Gelfoam and 50 mg  protamine.  The deep layers were closed  with multiple running layers of 2-0 and 3-0 Vicryl suture.  The skin was  closed with 4-0 Vicryl subcuticular stitch.  The patient tolerate  procedure well and there were no complications.  Instrument, sponge and  needle counts were  correct at the end of the case.  The patient was  taken to the recovery room in stable condition.      Jessy Oto. Fields, MD  Electronically Signed     CEF/MEDQ  D:  09/24/2007  T:  09/25/2007  Job:  [id number]

## 2010-11-20 NOTE — Discharge Summary (Signed)
[name], [name]               ACCOUNT NO.:  [id number]   MEDICAL RECORD NO.:  [id number]          PATIENT TYPE:  INP   LOCATION:  [id number]                         FACILITY:  [facility]   PHYSICIAN:  Sol Blazing, M.D.DATE OF BIRTH:  Apr 15, 1966   DATE OF ADMISSION:  03/05/2007  DATE OF DISCHARGE:  03/07/2007                               DISCHARGE SUMMARY   CONSULTATIONS:  Dr. Benson Norway of gastroenterology.   PROCEDURES AND IMAGING:  1. CT of the abdomen and pelvis on March 05, 2007 that showed slight      increase in tiny amounts of ascites, stable splenomegaly, renal      atrophy, multiple renal lesions and cysts, and left adrenal      hyperplasia, also showed a small amount of free pelvic fluid and a      question of a 2.5 x 3.5 cm right adnexal cyst that needs ultrasound      for further evaluation.  2. EGD on March 06, 2007 that showed normal esophagus, possible      superficial ulcer versus erythematous patch in the antrum of the      stomach, status post biopsy, and duodenitis in the duodenum.  3. Colonoscopy showed normal colon except for 1 small diverticula in      the sigmoid region and internal and external hemorrhoids, biopsies      pending.   REASON FOR ADMISSION:  Nausea, vomiting, diarrhea and near-syncope x2  days.   DISCHARGE DIAGNOSES:  1. Intermittent nausea, vomiting, diarrhea of unclear etiology.  2. Left adrenal hyperplasia of undetermined etiology that is stable.  3. Right adnexal cyst.  4. End-stage renal disease on hemodialysis.  5. Hypertension.  6. Secondary hyperparathyroidism.  7. Anemia of chronic disease.  8. Morbid obesity.  9. Congestive heart failure with an ejection fraction of 45 to 50%      with inferior septal akinesis based on a 2D echo in August 2008.  10.Paroxysmal atrial fibrillation with rapid ventricular response not      on Coumadin.  11.Status post cholecystectomy.  [id number] post umbilical hernia repair in 2004.   DISCHARGE  MEDICATIONS:  1. Nephrovite 1 tab p.o. daily.  2. Metoprolol 75 mg p.o. b.i.d.  3. Atrovent MDI 2 puffs q.6h. p.r.n. wheezing.  4. Phenergan 25 mg p.o. q.6h. p.r.n. nausea.  5. Meclizine 12.5 mg p.o. b.i.d.  6. The patient is to stop taking her Minoxidil, Clonidine, Norvasc and      Lisinopril until further evaluation by her primary care physician      due to hypotension during this hospitalization.   DISPOSITION:  The patient will be discharged home.  She has resolution  of her nausea and vomiting and is without diarrhea.  She is eating well  at her baseline appetite.  She is able to walk and is not requiring any  oxygen.  She is going to go home on a renal diet.  Activity as  tolerated.   DISCHARGE FOLLOW UP:  1. Normal hemodialysis on Tuesday/Thursday/Saturday at [facility].  2. Dr. Benson Norway.  The patient is to call on Tuesday at  [phone] for an      appointment in 1-2 weeks.   FOLLOW-UP ISSUES:  1. Nausea, vomiting, diarrhea etiology.  2. Left adrenal hyperplasia will need to be addressed.  3. Right adnexal cyst needs to be addressed probably with a pelvic      ultrasound.  4. Blood pressure needs to be followed and potentially have her anti-      hypertensive medications added back on to her regimen.   LABORATORY DATA:  On the day of discharge the patient's potassium is  3.8, BUN 32, creatinine 10.84 and phosphorus 5.5.  CBC had a white blood  cell count of 6.2, hemoglobin 10.2, platelets 130.  AM cortisol was 7.6,  lactic acid 1.4, lipase 33.   HOSPITAL COURSE:  This is a 45 year old African-American female, an end-  stage renal disease patient on chronic dialysis who was admitted with  intermittent episodes for the last 2 to 3 months of nausea, vomiting and  diarrhea that last 1 to 2 days at a time.  Please see the H and P for  more details of this presentation.  During this hospitalization GI was  consulted to help with the etiology of this nausea, vomiting and  diarrhea.   EGD and colonoscopy were both done that did not help shed  light on what possibly could be causing this and were largely negative.  It was felt that since the patient's symptoms had resolved by the day of  discharge and she was tolerating p.o. with a stable blood pressure that  it was okay to have the rest of the workup for this issue done as an  outpatient with Dr. Benson Norway.  The patient is to call early next week to  have an appointment as we could not set her up with one because it is a  holiday weekend on her day of discharge.  All of her other medical  issues were stable this hospitalization.  She is continued on her  hemodialysis on her Tuesday/ Thursday/Saturday schedule without  difficulty.  Some of her antihypertensives were held including her  Minoxidil. Clonidine, Norvasc and lisinopril due to some mild  hypotension that was slowly starting to resolve by discharge, but these  were held so that she may follow up on Tuesday at dialysis and have them  added back p.r.n.  No changes were made to her hemodialysis orders.      Mayra Neer, M.D.  Electronically Signed      Sol Blazing, M.D.  Electronically Signed    KS/MEDQ  D:  03/07/2007  T:  03/08/2007  Job:  [id number]

## 2010-11-20 NOTE — Assessment & Plan Note (Signed)
OFFICE VISIT   [name], [name]  DOB:  1966/02/13                                       05/03/2009  [id number]   The patient is a 45 year old female who has been on hemodialysis for  several years.  She most recently had a right upper arm AV graft which  is now occluded.  She presents today for consideration of further  hemodialysis access.   Her medical record was reviewed to determine all of her previous access  procedures.  She has previously had a right upper arm AV fistula in  2007.  She had a left upper arm AV fistula ligated in 2008 after  aneurysmal degeneration.  She had a left forearm AV graft placed in  November of 2008.  This failed soon after.  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  Most recently she had a right upper arm graft placed in August of  2009.  This has now also failed.   She denies any upper extremity swelling on the left side.  She is  currently dialyzing via a left-sided catheter.  She dialyzes on Tuesday,  Thursday and Saturday.   PHYSICAL EXAM:  Vital signs:  Blood pressure is 115/70 in the left arm,  pulse is 62 and regular.  Temperature is 98.2.  HEENT:  Unremarkable.  Neck:  Has 2+ carotid pulses without bruit.  Chest:  Clear to  auscultation.  Cardiac:  Exam is regular rate and rhythm without murmur.  Extremities:  She has 2+ brachial pulses bilaterally.  She has absent  right radial pulse.  She has a 1+ left radial pulse.  She has no  significant collaterals on the chest.  She has no significant upper  extremity edema.   I believe the best option for the patient at this time would be  angioplasty of her left central vein and then schedule a left upper arm  AV graft soon after that.  Although this may fail in the long-term I  believe this will have lower morbidity than placing a thigh graft in her  at this time.  She is fairly obese and a  thigh graft is going to be  fraught with possible complications related to infection from incisions.  She understands and agrees to proceed.  We have scheduled her left upper  arm graft placement for 05/22/2009.  We will schedule her to have an  angioplasty of her left subclavian vein in radiology prior to this.   Jessy Oto. Fields, MD  Electronically Signed   CEF/MEDQ  D:  05/03/2009  T:  05/04/2009  Job:  [id number]   cc:   Donato Heinz, M.D.

## 2010-11-20 NOTE — H&P (Signed)
NAMEMarland [name]  [name], [name] NO.:  [id number]   MEDICAL RECORD NO.:  [id number]          PATIENT TYPE:  INP   LOCATION:  5531                         FACILITY:  [facility]   PHYSICIAN:  Jacolyn Reedy, M.D.   DATE OF BIRTH:  [birth year]   DATE OF ADMISSION:  03/05/2007  DATE OF DISCHARGE:                              HISTORY & PHYSICAL   CHIEF COMPLAINT:  Nausea, vomiting, diarrhea and near syncope times 2  days.   HISTORY OF PRESENT ILLNESS:  This is a 45 year old female with a history  of intermittent nausea, vomiting, and diarrhea for 2-3 months with  recent admission for associated syncope in addition to her symptoms and  removal of a clot from an AV fistula in the right upper arm, that was  unfortunately unsuccessful.  She also has a history of end stage renal  disease and cardiomyopathy.  She is complaining of first developing  crampy and intermittent with stabbing abdominal pain in her lower right  and left quadrants, followed by vomiting at 12 noon one day prior to  admission.  This was then followed by runny diarrhea, again her  intermittent lower abdominal pain that will last 5-10 minutes without  alleviating or exacerbating factors.  Nothing brings on the pain and  nothing that she does can she attribute to causing the pain occurring.  The pain is stabbing, cramping, without radiation.  She does not have  melena, hematochezia, hematemesis, relief of pain with food or vomiting.  She also denies urinary symptoms.  During her episodes of nausea and  vomiting, she will develop shortness of breath with diaphoresis and  occasional lightheadedness when she gets up too quickly.   PAST MEDICAL HISTORY:  Includes end stage renal disease secondary to  hypertension with dialysis since 1998.  Secondary hyperparathyroidism.  She had an HD catheter placed on August 17th after her fistula was  unable to be clotted.  She also has secondary anemia of chronic disease  and renal  disease.  She has hypertension that sometimes is very  difficult to control, but also has a strange cycle where it becomes low  over the course of months requiring changes in her medicines.  She also  has morbid obesity.  She has congestive heart failure with an ejection  fraction of 45-50% with inferoseptal akinesis based on a 2-D  echocardiogram obtained August 2008.  She has a history of paroxysmal  atrial fibrillation with RVR, but she is not on Coumadin.  She has a  history of sigmoid diverticulosis, seen on abdominal CT scan in 2005.  She is status cholecystectomy.  She has a history of a periumbilical  hernia repair in 2004, specifically a ventral herniorrhaphy with a CT  scan in 2005 showing no residual of the hernia.  She also has left  adrenal hyperplasia seen on CT scan, that has been growing since 2005.   MEDICATIONS:  1. Norvasc 10 mg once a day.  2. PhosLo 5 pills 3 times a day with meals.  3. Sensipar 16 mg once a day.  4. Lisinopril 40 mg once a day.  5. Meclizine 12.5 mg twice a day.  6. Metoprolol 75 mg twice a day.  7. Renagel 1600 mg three times a day with meals.  8. Atrovent MDI as needed, which she states she uses about once a      week.  9. Nephrovite 1 tablet once a day.  10.Minoxidil 2.5 mg twice daily for hypertension.   SOCIAL HISTORY:  She lives in [place] with her children.  She has a  20-year half pack a day smoking history with no alcohol or drug use.   FAMILY HISTORY:  Pertinent for hypertension and diabetes in her mother  and father and a sister with CHF and another with hypertension.  She is  currently disabled.   REVIEW OF SYMPTOMS:  Refer to history of present illness.  She is  dialyzed on Tuesdays, Thursdays and Saturdays at [facility], where she  receives Venofer 15 mg IV once a week on Thursdays.  She has a BFR of  500, duration 4 hours, K2+, tight heparin.   PHYSICAL EXAMINATION:  VITAL SIGNS:  Temperature 96.7, pulse 120,  respiratory  18, blood pressure 160/71, O2 saturation 97% on room air.  GENERAL:  She was sitting in bed and appearing a little uncomfortable,  but in no apparent distress.  HEENT:  Her oral mucosa is dry, as is her tongue but she has no  tonsillar exudates.  NECK:  Supple with no JVD.  No carotid bruits.  She had no  lymphadenopathy.  CARDIOVASCULAR:  S1 and S2, tachycardiac, but regular with no murmurs  auscultated.  LUNGS:  Clear to auscultation bilaterally with no wheezes or crackles.  ABDOMINAL:  She has diffuse lower abdominal tenderness, but no rebound  or guarding with a morbidly obese abdomen with some scars noted.  No  palpable masses.  EXTREMITIES:  She has a right IJ hemodialysis cath that was placed on  August 18th.  NEURO:  Nonfocal.   LABORATORY DATA:  Chest x-ray showed no active disease.   Hemoglobin 14, hematocrit 42 with a MCV of 100.  White blood cell count  11.9 with an ANT of 8.7, platelets 186.  Sodium 135, potassium 4.3,  chloride 102, bicarb 24, BUN 52, creatinine 13.8, glucose 89.  On August  16, she had a lipase of 23.   ASSESSMENT/PLAN:  1. Nausea, vomiting, abdominal pain, and diarrhea with a history of      ventral hernia.  These symptoms are acute on chronic and now she      has an association of lightheadedness.  She is status post      cholecystectomy and status post two C-sections with the CT evidence      of sigmoid diverticulosis.  Differential includes intermittent      torsion secondary to adhesions, along with diverticulitis with a      modestly elevated WBC.  This would be definitely a mild      diverticulitis in an atypical presentation.  Also possible is      abdominal abscess, as are gynecological etiologies, such as ovarian      torsion and/or malignancy, among others.  We will check a CT      abdomen and pelvis with and without contrast, look for abdominal      and gynecological pathology, in addition to consulting GI for this      interesting  three-month history of intermittent nausea and      vomiting.  Also in the differential is carcinoid syndrome, though  farther down and we will not test for that until further workup has      been exhausted.  We will continue with her nausea medications and      monitor her electrolytes closely.  2. End stage renal disease.  She does appear stable, although she has      not received dialysis yet today.  We will dialyze her today, but      keep even because of her mild hypotension.  3. Hypertension.  Again, she is now hypotensive and we will dialyze      and keep even.  We will hold her blood pressure medications, except      her Lopressor because of her history of paroxysmal atrial      fibrillation, but we will decrease the dose mildly and increase it      as her blood pressure tolerates.  4. Left adrenal hyperplasia.  This is the something that has had some      form of workup as an outpatient, but we will check her cortisol      while she is in-house, follow through results of the CT scan and do      a more detail      workup. At present, I can not think of any etiologies where the      left adrenal hyperplasia would be associated with her current      presentation, but this is something that we will research during      her hospital course and after her discharge as well.  For      prophylaxis, we will give her Protonix and SCDs.      Jacolyn Reedy, M.D.  Electronically Signed     JC/MEDQ  D:  03/05/2007  T:  03/05/2007  Job:  [id number]

## 2010-11-20 NOTE — Op Note (Signed)
[name], [name]               ACCOUNT NO.:  [id number]   MEDICAL RECORD NO.:  [id number]          PATIENT TYPE:  AMB   LOCATION:  SDS                          FACILITY:  [facility]   PHYSICIAN:  Jessy Oto. Fields, MD  DATE OF BIRTH:  03/16/1966   DATE OF PROCEDURE:  05/11/2007  DATE OF DISCHARGE:                               OPERATIVE REPORT   PROCEDURE:  Left forearm AV graft.   PREOPERATIVE DIAGNOSIS:  End-stage renal disease.   POSTOPERATIVE DIAGNOSIS:  End-stage renal disease.   ANESTHESIA:  Local with IV sedation.   ASSISTANT:  Remo Lipps, RNFA   OPERATIVE FINDINGS:  1. 6-mm PTFE.  2. 3.5-mm deep brachial vein.  3. Thickened brachial artery.   OPERATIVE DETAILS:  After obtaining informed consent, the patient was  taken to the operating room.  The patient was placed in the supine  position on the operating room table.  After adequate sedation, the  patient's entire left upper extremity was prepped and draped in the  usual sterile fashion.  Local anesthesia was infiltrated near the  antecubital crease.   A longitudinal incision was made in this location over the area of the  brachial artery.  The incision was carried down through the subcutaneous  tissues down to the level of the deep brachial vein which was adjacent  to the deep brachial artery.  The vein was approximately 3.5 mm in  diameter.  It was dissected free circumferentially.  There was some scar  tissue near the lower portion of the incision from previous  brachiocephalic AV fistulas.  These adhesions were taken down with sharp  dissection.  The brachial artery was dissected free circumferentially.  The artery was slightly thickened on palpation, most likely from the  previous AV fistula.  Next, a subcutaneous tunnel was created in a loop  configuration down the forearm and a transverse incision made in the  distal forearm for assistance in tunneling.  The 6-mm PTFE graft was  then brought through the  subcutaneous tunnel, with the venous limb of  the graft on the radial aspect of the forearm.  The patient was then  given 5000 units of intravenous heparin.  A fine bulldog clamp was used  to control the vein proximally and distally.  The vein was opened  longitudinally.  There was one slightly thickened valve at the inferior  aspect.  This taken was down with Potts scissors.  The graft was then  beveled and sewn end of graft to side of vein using a running 6-0  Prolene suture.  At the completion of the anastomosis, the graft was  clamped at its apex.  The graft was then pulled taut to length.  The  vessel loops were pulled taut on the arterial side.  A longitudinal  arteriotomy was made.  The artery was very thick-walled on the inner  lumen.  The vein was beveled and sewn end of graft to side of artery  using a running 6-0 Prolene suture.  Just prior to completion of the  anastomosis, this was forebled, backbled, and thoroughly flushed.  When  the anastomosis was secured, the clamps were released.  There was a  palpable thrill above the graft immediately.  The patient still had a  palpable radial pulse at the end of the case.  Next, hemostasis was  obtained.  The subcutaneous tissues were reapproximated using running 3-  0 Vicryl suture.  The skin of both incisions was closed with a 4-0  Vicryl subcuticular stitch.   The patient tolerated the procedure well, and there were no  complications.  Instrument, sponge, and needle counts were correct at  the end of the case.  The patient was taken to the recovery room in  stable condition.      Jessy Oto. Fields, MD  Electronically Signed     CEF/MEDQ  D:  05/11/2007  T:  05/11/2007  Job:  [id number]

## 2010-11-23 NOTE — Op Note (Signed)
[name], [name]               ACCOUNT NO.:  [id number]   MEDICAL RECORD NO.:  [id number]          PATIENT TYPE:  AMB   LOCATION:  SDS                          FACILITY:  [facility]   PHYSICIAN:  Nelda Severe. Kellie Simmering, M.D.  DATE OF BIRTH:  [birth year]   DATE OF PROCEDURE:  07/29/2005  DATE OF DISCHARGE:                                 OPERATIVE REPORT   PREOP DIAGNOSIS:  End stage renal disease.   POSTOP DIAGNOSIS:  End stage renal disease.   OPERATION:  Creation of right upper arm brachial artery to cephalic vein AV  fistula (Kauffman shunt).   SURGEON:  Dr. Kellie Simmering   FIRST ASSISTANT:  Suzzanne Cloud.   ANESTHESIA:  Local.   PROCEDURE:  Placed on the operating room, placed in supine position at which  time the right upper extremity was prepped with Betadine scrub solution,  draped in routine sterile manner. After infiltration of 1% Xylocaine.  Transverse incision was made in the antecubital area, antecubital vein  dissected free. It was ligated distally and transected and was a 3.5 mm vein  of excellent quality. Brachial arteries exposed beneath the fascia and  encircled with vessel loops. 3000 inches heparin given intravenously. Artery  was occluded proximally and distally opened with 15 blade, extended with  Potts scissors. It would accept a 3 mm dilator without difficulty. Vein was  carefully measured spatulated and anastomosed end-to-side with 6-0 Prolene.  Vesseloops then released. There was good pulse and thrill in the fistula  with loud audible Doppler flow up to the deltopectoral groove and good  radial arterial flow with a fistula open. Wound was closed with Vicryl in  subcuticular fashion. Sterile dressing applied. The patient taken to  recovery in satisfactory condition.           ______________________________  Nelda Severe Kellie Simmering, M.D.     JDL/MEDQ  D:  07/29/2005  T:  07/29/2005  Job:  [id number]

## 2010-11-23 NOTE — Discharge Summary (Signed)
[name], [name]                           ACCOUNT NO.:  [id number]   MEDICAL RECORD NO.:  [id number]                   PATIENT TYPE:  INP   LOCATION:  5531                                 FACILITY:  [facility]   PHYSICIAN:  Myriam Jacobson, P.A.             DATE OF BIRTH:  09/24/1965   DATE OF ADMISSION:  02/25/2002  DATE OF DISCHARGE:  02/28/2002                                 DISCHARGE SUMMARY   DISCHARGE DIAGNOSES:  1. Acute cholecystitis, status post laparoscopic cholecystectomy.  2. End-stage renal disease, on chronic hemodialysis.  3. Hypertension.  4. Tobacco abuse.  5. Obesity.   CONSULTATIONS:  Dr. Judeth Horn.   PROCEDURE:  1. Laparoscopic cholecystectomy on February 26, 2002, Dr. Hulen Skains.  2. Hemodialysis.   HISTORY OF PRESENT ILLNESS:  The patient is a 45 year old African-American  female with end-stage renal disease who dialyzes on Tuesday, Thursday, and  Saturday at the [facility].  She is admitted with a two-  day history of nausea and vomiting, which started in the epigastric and  right upper quadrant abdominal areas after dialysis on the day of admission.   HOSPITAL COURSE:  She had an ultrasound which showed a thickened gallbladder  wall with evidence of cystitis.  Her liver function tests are normal, as  are amylase and lipase; however, the white count is 9.4 with a left shift.  Dr. Hulen Skains saw the patient in consultation and recommended a laparoscopic  cholecystectomy.  She was admitted for the same.  Following the laparoscopic cholecystectomy, the patient was admitted to the  renal unit.  She had been empirically placed on Zosyn.  Her presurgical labs  included hemoglobin of 11.8, platelets 216,000.  Potassium 3.6, BUN 13,  creatinine 6.2, albumin 3.7.  Of note, the patient did have dialysis earlier  in the day.  The hCG quantitive study was less than 2, which is negative.  She did well postoperatively.  She took fluids for the first  several meals  and advanced to solids on February 27, 2002.  A recheck of the hemoglobin  showed a slight decrease to 10.1.  A recheck of the potassium was 3.0.  She  was dialyzed on an added-potassium bath.   DISPOSITION:  After getting up and moving around, the patient felt much  better, and was able to be discharged home on February 28, 2002.   DISCHARGE MEDICATIONS:  1. Vicodin 5/500 one tab q.4h. p.r.n. pain.  2. Nephro-Vite one tab q.d.  3. Renagel 800 mg three tab with meals t.i.d.  4. She is on an added-potassium bath.  5. To be on tight heparin for one week.   DIALYSIS INSTRUCTIONS:  1. Potassium is to be checked at dialysis in one week after discharge.  2. The estimated dry weight 126.5 pounds.  3. Epogen dose 500 units IV q. hemodialysis.  4. Monitor the hemoglobin in the outpatient setting.  5. Zemplar 5.0 mcg IV q hemodialysis.   ACTIVITY:  As tolerated.   DIET:  A 90 g protein, 2 g sodium, 2 g potassium diet with fluids to 1200 cc  per day.   DIALYSIS SCHEDULE:  On Tuesday, Thursday, and Saturday.   FOLLOW UP:  She is to follow up in one to two weeks with Dr. Hulen Skains.                                                  Myriam Jacobson, P.A.    MBB/MEDQ  D:  03/22/2002  T:  03/23/2002  Job:  [id number]   cc:   Sherril Croon, M.D.   [facility]   Judeth Horn, M.D.

## 2010-11-23 NOTE — Op Note (Signed)
[name], [name]                           ACCOUNT NO.:  [id number]   MEDICAL RECORD NO.:  [id number]                   PATIENT TYPE:  INP   LOCATION:  5531                                 FACILITY:  [facility]   PHYSICIAN:  Kathryne Eriksson. Dahlia Bailiff, M.D.            DATE OF BIRTH:  Feb 29, 1966   DATE OF PROCEDURE:  02/26/2002  DATE OF DISCHARGE:  02/28/2002                                 OPERATIVE REPORT   PREOPERATIVE DIAGNOSES:  Acute cholecystitis and cholelithiasis.   POSTOPERATIVE DIAGNOSES:  Acute cholecystitis and cholelithiasis.   OPERATION PERFORMED:  Laparoscopic cholecystectomy.   SURGEON:  Kathryne Eriksson. Hulen Skains, M.D.   ASSISTANT:  Odis Hollingshead, M.D.   ANESTHESIA:  General endotracheal.   ESTIMATED BLOOD LOSS:  Less than 30 cc.   COMPLICATIONS:  None.   CONDITION:  Stable.   FINDINGS:  Acute cholecystitis with edema and inflammation of the  gallbladder./   SPECIMENS:  Gallbladder with stones.   INDICATIONS FOR PROCEDURE:  The patient is a 45 year old renal failure  patient with abdominal pain, right upper quadrant with nausea and vomiting  and an ultrasound demonstrating acute cholecystitis who now comes in for a  laparoscopic cholecystectomy.   DESCRIPTION OF PROCEDURE:  The patient was taken to the operating room and  placed on the table in the supine position.  After an adequate endotracheal  anesthetic was administered, the patient was prepped and draped in the usual  sterile manner exposing the midline and the right upper quadrant.   The patient was placed in reverse Trendelenburg position.  A superumbilical  curvilinear incision was made down to the midline fascia through which a  Veress needle was subsequently passed into the peritoneal cavity while  tenting up on the anterior abdominal wall with sharp towel clamps.  Once we  had confirmed the position of the Veress needle in the peritoneal cavity  using a saline test, carbon dioxide insufflation was  instilled into the  peritoneal cavity up to a maximal intra-abdominal pressure of 15 mmHg.  We  subsequently passed a 10-11 mm cannula through the superumbilical fascia  into the peritoneal cavity and confirmed its intraperitoneal position using  the laparoscope with attached camera and light source.  Once this was done,  two right costal margin 5 mm cannulas and a subxiphoid 11-12 mm cannula were  passed under direct vision into the peritoneal cavity.  The patient was  placed in steeper reverse Trendelenburg position and the left side was  tilted down.   Once the patient was in adequate position, the dissection was begun.  The  gallbladder was grasped at its dome and although tense did not rupture and  could be adequately exposed.  The retractor was towards the anterior  abdominal wall and towards the right upper quadrant.  This exposed the  infundibulum and the adhesions over this area showing some of the  inflammatory edema.  The  peritoneum overlying the triangle of Calot and the  hepatoduodenal triangle was dissected out carefully with a [place] exposing both the cystic duct and the cystic artery.  The patient  had about a 8 mm cystic artery which we clearly showed only went to the  gallbladder and not going to the liver.  There was a clear window behind the  cystic duct and the cystic artery prior to ligature, both of which were  doubly clipped proximally and then transected.  I then dissected the  gallbladder out of its bed using electrocautery.  This was done with minimal  difficulty; however, we did use an EndoCatch bag to pull the gallbladder out  through the superumbilical fascia which had to be stretched to the  appropriate size to accommodate the large stone that was contained within.   When the gallbladder was removed, we irrigated with saline solution and  found there to be adequate control of bleeding and then we removed our  cannulas.  The superumbilical  fascia was reapproximated using a figure-of-  eight stitch of 0 Vicryl passed on a UR6 needle.  0.25% Marcaine was  injected at all sites.  The skin was reapproximated using a running  subcuticular stitch of 4-0 Vicryl.  Sterile dressings were applied to all  incisions.  Sponge, needle and instrument counts were correct at the  conclusion of the case.                                                 Kathryne Eriksson. Dahlia Bailiff, M.D.    JOW/MEDQ  D:  02/26/2002  T:  03/02/2002  Job:  [id number]   cc:   Sherril Croon, M.D.

## 2010-11-23 NOTE — H&P (Signed)
NAME:  [name], SALZANO NO.:  [id number]   MEDICAL RECORD NO.:  [id number]                   PATIENT TYPE:  INP   LOCATION:  5531                                 FACILITY:  [facility]   PHYSICIAN:  Sherril Croon, M.D.                DATE OF BIRTH:  April 27, 1966   DATE OF ADMISSION:  02/25/2002  DATE OF DISCHARGE:                                HISTORY & PHYSICAL   HISTORY OF PRESENT ILLNESS:  This is a 45 year old African American female  who has been end-stage renal disease x5 years secondary to hypertension.  She receives her dialysis at [facility].  She admits to  having felt unwell for the past few days with some nausea and vomiting and  then immediately following her dialysis yesterday afternoon, she developed  some right upper quadrant abdominal pain which became severe enough that she  presented to the emergency room.   After an evaluation in the emergency room which included a right upper  quadrant ultrasound, it was determined that she had acute cholecystitis and  a surgical consult was obtained.   PAST MEDICAL HISTORY:  1. Hypertension.  2. End-stage renal disease.   MEDICATIONS:  Nephro-Vite, Os-Cal.   REVIEW OF SYSTEMS:  GENERAL:  No fevers, sweats or chills.  EYES:  No visual  complaints.  EARS, NOSE AND THROAT:  No complaints.  No sore throat.  CARDIOVASCULAR:  She has good exercise tolerance.  Denies any anginal chest  pains.  No orthopnea.  No PND.  No shortness of breath.  No palpitations.  No blackout spells or ankle swelling.  RESPIRATORY SYSTEM:  Denies any  cough, wheezing, hemoptysis or abnormalities on chest x-ray.  ABDOMINAL  SYSTEM:  As per HPI.  Admits to occasional constipated stool with Os-Cal.  UROGENITAL:  States makes some urine with no dysuria.  NEUROLOGIC:  Denies  any headache, numbness, tingling, pins and needles or weakness in her upper  and lower extremities.  No history of stroke or seizures.   RHEUMATOLOGIC:  Denies any joint pains or swellings.  ENDOCRINE:  Denies any swelling in the  neck, bulging of eyes or heat or cold intolerance.  DERMATOLOGIC:  Denies  any skin rash or lesions.   SOCIAL HISTORY:  She has two children.  She is a smoker of a half a pack of  cigarettes per day.  She denies any alcohol and she is disabled.   FAMILY HISTORY:  Her father and mother are alive.  Father is 71.  Her mother  is 94 with diabetes.   ALLERGIES:  No known drug allergies.   PHYSICAL EXAMINATION:  VITAL SIGNS:  Afebrile.  Blood pressure 140/80, pulse  of 80.  GENERAL APPEARANCE:  Overweight, non-distressed African American female  lying comfortably in bed.  HEAD AND EYES:  Normocephalic, atraumatic.  Pupils round, equal and  reactive.  Funduscopic evaluation deferred.  EARS, NOSE AND THROAT:  External canals normal.  Oropharynx clear.  NECK:  Supple.  No thyromegaly.  No adenopathy.  CARDIOVASCULAR:  Heart regular rate and rhythm with no murmurs, rubs, or  gallops.  Nondisplaced apex beat.  RESPIRATORY:  Lung fields are clear to auscultation.  No wheezes or rales.  Percussion note resonant.  ABDOMEN:  Abdomen reveals some tenderness in the right upper quadrant with  some guarding.  Bowel sounds are diminished.  NEUROLOGIC:  Cranial nerves II-XII are intact.  Extremities are 1+  throughout.  Downward going Babinski's.  EXTREMITIES:  No cyanosis, clubbing or edema.  She has a left A-V fistula  with good thrill and bruit.   LABORATORY AND ACCESSORY CLINICAL DATA:  Beta hCG less than 2.  WBC 9.4,  hemoglobin 11.8, platelets 260,000.  Sodium 139, potassium 3.6, chloride  197, CO2 33, BUN 13, creatinine 6.2, glucose 104.   Right upper quadrant ultrasound reportedly reveals some stranding and signs  consistent with acute cholecystitis.   Alkaline phosphatase 79, ALT 21, AST 14, albumin 2.7, calcium 9.2, lipase  25.   ASSESSMENT AND PLAN:  1. Acute cholecystitis, status post  general surgery consult.  Plan for     operating room today.  Placed on Zosyn 2.25 mg renally adjusted q.8h.  2. End-stage renal disease.  No electrolyte imbalance.  Will plan dialysis     and schedule Tuesday/Thursday/Saturday.  3. Hypertension, adequately controlled at present.  4. Pain.  This is adequately controlled with Dilaudid.                                               Sherril Croon, M.D.    MWW/MEDQ  D:  02/26/2002  T:  03/01/2002  Job:  [id number]

## 2010-11-23 NOTE — Consult Note (Signed)
[name], [name]                           ACCOUNT NO.:  [id number]   MEDICAL RECORD NO.:  [id number]                   PATIENT TYPE:  INP   LOCATION:  5531                                 FACILITY:  [facility]   PHYSICIAN:  Kathryne Eriksson. Dahlia Bailiff, M.D.            DATE OF BIRTH:  10/28/1965   DATE OF CONSULTATION:  DATE OF DISCHARGE:  02/28/2002                           GENERAL SURGERY CONSULTATION   REASON FOR CONSULTATION:  Thank you very much for asking me to see this  patient, a pleasant 45 year old chronic renal dialysis  patient, who comes  in with abdominal pain right upper quadrant and a sonographic Murphy sign.   The ultrasound apparently demonstrated a thick gallbladder wall with  evidence of cholecystitis.  She has had nausea, vomiting, and abdominal pain  over towards her right flank and right costal margin.  It was because of  these symptoms that an ultrasound was done.  Her liver function tests  however, are normal as are her amylase and lipase, however, her white count  is 9.4 with a left shift.   PAST MEDICAL HISTORY:  Her past medical history is significant for  hypertension and chronic renal insufficiency.   PAST SURGICAL HISTORY:  She has had two surgeries, two C-sections; no other  surgeries.   MEDICATIONS:  Her medications include Nephro-Vite and calcium chloride.  She  takes nothing for hypertension.   ALLERGIES:  She has no known drug allergies.   REVIEW OF SYSTEMS:  She has had no jaundice, diarrhea, fevers, or chills.   PHYSICAL EXAMINATION:  VITAL SIGNS: Her pulse is 71, blood pressure 178/100,  temperature of 97.7.  HEENT: She is normocephalic and atraumatic and anicteric.  NECK: Supple, with no bruits.  CHEST: Clear to auscultation.  CARDIAC: Regular rhythm and rate, with no murmurs, gallops, lifts, or  heaves.  ABDOMEN: Her abdomen is distended, she is a fairly obese woman, hypoactive  bowel sounds, she has tenderness in the right upper quadrant  in the right  flank area.  PELVIC: Deferred.   LABORATORY STUDIES:  She has a white count 9.4 with a left shift, her  hemoglobin is 11.8 and hematocrit 34.8.  Her electrolytes are all within  normal limits.  Her BUN is 13, creatinine is 6.2.  Alk phos, SGOT, SGPT are  all normal, total bilirubin is also normal at 0.8.  Amylase 75, lipase was  not done.  Urine pregnancy test is negative.   IMPRESSION:  Based on the ultrasound findings, the patient has acute  cholecystitis and her symptoms do correlate __________  tenderness in her  right upper quadrant.  She has a positive sonographic Murphy sign and a  positive clinical Murphy sign.    PLAN:  The plan is to have the patient admitted by the renal service and  subsequently perform a laparoscopic cholecystectomy.  We will plan to do  this within the next 24 hours.  Kathryne Eriksson. Dahlia Bailiff, M.D.    JOW/MEDQ  D:  02/26/2002  T:  02/28/2002  Job:  [id number]

## 2010-11-23 NOTE — Op Note (Signed)
NAME:  [name], [name]                         ACCOUNT NO.:  [id number]   MEDICAL RECORD NO.:  [id number]                   PATIENT TYPE:  OIB   LOCATION:  2550                                 FACILITY:  [facility]   PHYSICIAN:  Odis Hollingshead, M.D.            DATE OF BIRTH:  July 12, 1965   DATE OF PROCEDURE:  02/04/2003  DATE OF DISCHARGE:                                 OPERATIVE REPORT   PREOPERATIVE DIAGNOSIS:  Ventral incisional hernia.   POSTOPERATIVE DIAGNOSIS:  Ventral incisional hernia (2.5 to 3 cm in size).   PROCEDURE:  Repair of ventral incisional hernia with polypropylene mesh.   SURGEON:  Odis Hollingshead, M.D.   ANESTHESIA:  General.   INDICATIONS:  [name] is a 45 year old female who had an emergency  laparoscopic cholecystectomy back in August 2003.  Approximately six months  ago she noticed a painful bulge in the supraumbilical region where her  supraumbilical incision was.  This was found to be a ventral incisional  hernia in the periumbilical area, and she now presents for elective repair.  The procedure and risks were explained to her preoperatively.   TECHNIQUE:  She was seen in the holding area and brought to the operating  room and placed supine on the operating table.  General anesthetic was  administered.  The abdominal wall was sterilely prepped and draped.  Local  anesthetic consisting of 0.5% Marcaine was infiltrated in the periumbilical  region.  The hernia was reduced.  The previous supraumbilical incision was  excised and incision extended in a curvilinear fashion both to the right and  to the left.  The subcutaneous tissue was divided with the cautery.  The  umbilicus was then encircled.  The hernia sac was identified and incised,  raising the umbilicus.  The hernia contents contained omentum, which was  released.  The sac was then excised using the cautery and sent for  pathologic review.   Following this, I noted the hernia to be about  2.5 to 3 cm in size.  I  raised flaps in all directions circumferentially for approximately 4-5 cm in  each direction down to the level of the fascia.  Next I primarily closed the  defect with interrupted 0 Surgilon sutures.  I then placed a piece of onlay  polypropylene mesh directly over the closure and threaded the primary  closure sutures through the mesh and anchored the mesh right over the  primary closure with these.  The periphery of the mesh was then anchored to  the fascia with a running 0 Prolene suture.   Hemostasis was noted to be adequate.  The wound was irrigated with  antibiotic solution.  There was a fairly significant dead space given the  fact that she was fairly obese.  I decided to leave a drain in.  I made a  stab wound in the right mid abdominal region and then placed a  size 10 round  drain into the wound just above the mesh.  I anchored the drain to the skin  with a 3-0 nylon suture.  I subsequently reattached the umbilicus to the  mesh with a 3-0 Vicryl suture and then closed the subcutaneous tissue over  the mesh with a running 3-0 Vicryl suture.  The skin was closed with a 4-0  Monocryl subcuticular stitch.  Steri-Strips and sterile dressings were  applied.   She tolerated the procedure well without any apparent complications.  She  will be kept overnight in bed and undergo hemodialysis tomorrow and I will  inform the nephrology service that she is here.                                               Odis Hollingshead, M.D.    [name]  D:  02/04/2003  T:  02/05/2003  Job:  [id number]   cc:   Donato Heinz, M.D.  [address]  [place]  [place] [address]  Fax: [phone]

## 2010-11-23 NOTE — Op Note (Signed)
[name], [name]               ACCOUNT NO.:  [id number]   MEDICAL RECORD NO.:  [id number]          PATIENT TYPE:  AMB   LOCATION:  SDS                          FACILITY:  [facility]   PHYSICIAN:  Judeth Cornfield. 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   PROCEDURE:  Excision of large aneurysmal left AV fistula with a vein  patch angioplasty of the brachial artery.   SURGEON:  Scot Dock, M.D.   ASSISTANT:  Remo Lipps, R.N.F.A.   ANESTHESIA:  General.   TECHNIQUE:  The patient was taken to the operating room and received a  general anesthetic.  The entire left upper extremity was prepped and  draped in usual sterile fashion.  Two large elliptical incisions were  made over the two largest components of the aneurysm, and through these  incisions, the aneurysms were dissected free distally, and the vein was  ligated with two 2-0 silk ties.  Hemostasis was obtained using  electrocautery.  I did leave a skin bridge between the 2 incisions.  In  order to deal with the proximal anastomosis, a separate longitudinal  incision was made over the proximal anastomosis, and here the proximal  fistula and brachial artery were identified.  Tourniquet was placed on  the arm, and the patient was heparinized.  The arm was exsanguinated  with an Esmarch bandage and the tourniquet inflated to 250 mmHg.  Under  tourniquet control, the fistula was removed from the artery.  A segment  of the vein was then used as a patch, and this was sewn with continuous  5-0 Prolene suture.  Prior to completing the anastomosis, the artery was  back bled and flushed, after the tourniquet was released, the  anastomosis was completed and flow reestablished to the hand.  Hemostasis  was obtained of the wounds.  The wounds were closed with a  deep layer of 3-0 Vicryl.  The skin closed with 4-0 Vicryl.  Pressure  dressing was applied.  The patient tolerated the procedure well and was  transferred to recovery room in satisfactory condition.  All needle and  sponge counts were correct.      Judeth Cornfield. Scot Dock, M.D.  Electronically Signed     CSD/MEDQ  D:  08/08/2006  T:  08/08/2006  Job:  [id number]

## 2011-04-01 LAB — POCT I-STAT 4, (NA,K, GLUC, HGB,HCT)
Glucose, Bld: 81
HCT: 44
Hemoglobin: 15
Operator id: 181601
Potassium: 4
Sodium: 133 — ABNORMAL LOW

## 2011-04-02 LAB — POCT I-STAT 4, (NA,K, GLUC, HGB,HCT)
Hemoglobin: 12.2
Potassium: 3.7

## 2011-04-03 LAB — BASIC METABOLIC PANEL WITH GFR
CO2: 20
Chloride: 100
Creatinine, Ser: 6.7 — ABNORMAL HIGH
GFR calc Af Amer: 8 — ABNORMAL LOW

## 2011-04-03 LAB — CBC
HCT: 38.7
Hemoglobin: 12.9
MCHC: 33.3
MCV: 101.6 — ABNORMAL HIGH
Platelets: ADEQUATE
RBC: 3.81 — ABNORMAL LOW
RDW: 16.2 — ABNORMAL HIGH
WBC: 5.3

## 2011-04-03 LAB — BASIC METABOLIC PANEL
BUN: 14
Calcium: 9.7
GFR calc non Af Amer: 7 — ABNORMAL LOW
Glucose, Bld: 77
Potassium: 3.9
Sodium: 137

## 2011-04-03 LAB — DIFFERENTIAL
Basophils Absolute: 0
Basophils Relative: 0
Eosinophils Absolute: 0.1
Eosinophils Relative: 2
Lymphocytes Relative: 27
Lymphs Abs: 1.4
Monocytes Absolute: 0.3
Monocytes Relative: 6
Neutro Abs: 3.5
Neutrophils Relative %: 65

## 2011-04-03 LAB — TROPONIN I: Troponin I: 0.02

## 2011-04-04 LAB — POCT I-STAT 4, (NA,K, GLUC, HGB,HCT)
Glucose, Bld: 103 — ABNORMAL HIGH
Potassium: 3.6
Sodium: 136

## 2011-04-05 LAB — POCT I-STAT, CHEM 8
BUN: 22
Calcium, Ion: 1.04 — ABNORMAL LOW
Creatinine, Ser: 9.2 — ABNORMAL HIGH
Glucose, Bld: 96
TCO2: 25

## 2011-04-05 LAB — POCT I-STAT 4, (NA,K, GLUC, HGB,HCT)
Hemoglobin: 12.9
Potassium: 3.5

## 2011-04-16 LAB — POCT I-STAT 4, (NA,K, GLUC, HGB,HCT)
Glucose, Bld: 88
Operator id: 181601
Potassium: 3.9
Sodium: 134 — ABNORMAL LOW

## 2011-04-19 LAB — CBC
HCT: 33.7 — ABNORMAL LOW
HCT: 42
Hemoglobin: 11.6 — ABNORMAL LOW
Hemoglobin: 12.6
Hemoglobin: 14.2
MCHC: 33.6
MCHC: 33.8
MCHC: 34
MCV: 100.2 — ABNORMAL HIGH
MCV: 101.2 — ABNORMAL HIGH
MCV: 99.2
Platelets: 134 — ABNORMAL LOW
RBC: 2.99 — ABNORMAL LOW
RBC: 3.4 — ABNORMAL LOW
RBC: 3.69 — ABNORMAL LOW
RBC: 4.19
RBC: 4.64
RDW: 14.5 — ABNORMAL HIGH
RDW: 14.7 — ABNORMAL HIGH
RDW: 15.2 — ABNORMAL HIGH
WBC: 11.8 — ABNORMAL HIGH
WBC: 4.9
WBC: 6.2
WBC: 9.8

## 2011-04-19 LAB — I-STAT 8, (EC8 V) (CONVERTED LAB)
BUN: 52 — ABNORMAL HIGH
Chloride: 102
Glucose, Bld: 89
Hemoglobin: 15.3 — ABNORMAL HIGH
Operator id: 279831
Sodium: 135

## 2011-04-19 LAB — DIFFERENTIAL
Basophils Relative: 0
Basophils Relative: 0
Eosinophils Absolute: 0.3
Eosinophils Relative: 1
Lymphocytes Relative: 18
Lymphocytes Relative: 25
Lymphs Abs: 2
Lymphs Abs: 2.3
Monocytes Absolute: 0.4
Monocytes Relative: 4
Monocytes Relative: 6
Neutro Abs: 4
Neutrophils Relative %: 65
Neutrophils Relative %: 78 — ABNORMAL HIGH

## 2011-04-19 LAB — URINALYSIS, ROUTINE W REFLEX MICROSCOPIC
Bilirubin Urine: NEGATIVE
Ketones, ur: NEGATIVE
Nitrite: NEGATIVE
Protein, ur: 100 — AB
Specific Gravity, Urine: 1.011
Urobilinogen, UA: 0.2

## 2011-04-19 LAB — RENAL FUNCTION PANEL
Albumin: 3 — ABNORMAL LOW
Albumin: 3.2 — ABNORMAL LOW
CO2: 26
Calcium: 8.4
Chloride: 95 — ABNORMAL LOW
Chloride: 96
Creatinine, Ser: 15.58 — ABNORMAL HIGH
GFR calc Af Amer: 3 — ABNORMAL LOW
GFR calc non Af Amer: 3 — ABNORMAL LOW
Glucose, Bld: 106 — ABNORMAL HIGH
Glucose, Bld: 129 — ABNORMAL HIGH
Phosphorus: 5.5 — ABNORMAL HIGH
Phosphorus: 7.1 — ABNORMAL HIGH
Phosphorus: 7.8 — ABNORMAL HIGH
Potassium: 3.6
Potassium: 3.8
Sodium: 136
Sodium: 137

## 2011-04-19 LAB — COMPREHENSIVE METABOLIC PANEL
ALT: 25
AST: 16
Alkaline Phosphatase: 68
CO2: 22
CO2: 25
Calcium: 9.8
Creatinine, Ser: 11.94 — ABNORMAL HIGH
GFR calc Af Amer: 4 — ABNORMAL LOW
GFR calc non Af Amer: 3 — ABNORMAL LOW
Glucose, Bld: 96
Potassium: 4.3
Sodium: 136
Total Protein: 7.2

## 2011-04-19 LAB — POCT CARDIAC MARKERS
CKMB, poc: 6.8
Operator id: 257131
Troponin i, poc: <0.05

## 2011-04-19 LAB — CARDIAC PANEL(CRET KIN+CKTOT+MB+TROPI)
CK, MB: 8 — ABNORMAL HIGH
Total CK: 154

## 2011-04-19 LAB — LIPASE, BLOOD
Lipase: 23
Lipase: 33

## 2011-04-19 LAB — BASIC METABOLIC PANEL
Calcium: 8.9
GFR calc Af Amer: 3 — ABNORMAL LOW
GFR calc non Af Amer: 3 — ABNORMAL LOW
Sodium: 136

## 2011-04-19 LAB — CORTISOL-AM, BLOOD: Cortisol - AM: 7.6

## 2011-04-19 LAB — LACTIC ACID, PLASMA: Lactic Acid, Venous: 1.4

## 2011-08-12 DIAGNOSIS — Z940 Kidney transplant status: Secondary | ICD-10-CM | POA: Diagnosis not present

## 2011-08-12 DIAGNOSIS — E785 Hyperlipidemia, unspecified: Secondary | ICD-10-CM | POA: Diagnosis not present

## 2011-08-12 DIAGNOSIS — D509 Iron deficiency anemia, unspecified: Secondary | ICD-10-CM | POA: Diagnosis not present

## 2011-08-12 DIAGNOSIS — N2581 Secondary hyperparathyroidism of renal origin: Secondary | ICD-10-CM | POA: Diagnosis not present

## 2011-08-26 DIAGNOSIS — I10 Essential (primary) hypertension: Secondary | ICD-10-CM | POA: Diagnosis not present

## 2011-08-26 DIAGNOSIS — Z940 Kidney transplant status: Secondary | ICD-10-CM | POA: Diagnosis not present

## 2011-08-26 DIAGNOSIS — D509 Iron deficiency anemia, unspecified: Secondary | ICD-10-CM | POA: Diagnosis not present

## 2011-08-26 DIAGNOSIS — N2581 Secondary hyperparathyroidism of renal origin: Secondary | ICD-10-CM | POA: Diagnosis not present

## 2011-09-16 DIAGNOSIS — N2581 Secondary hyperparathyroidism of renal origin: Secondary | ICD-10-CM | POA: Diagnosis not present

## 2011-09-16 DIAGNOSIS — D509 Iron deficiency anemia, unspecified: Secondary | ICD-10-CM | POA: Diagnosis not present

## 2011-09-16 DIAGNOSIS — Z940 Kidney transplant status: Secondary | ICD-10-CM | POA: Diagnosis not present

## 2011-11-03 ENCOUNTER — Inpatient Hospital Stay (HOSPITAL_COMMUNITY): Payer: Medicare Other

## 2011-11-03 ENCOUNTER — Encounter (HOSPITAL_COMMUNITY): Payer: Self-pay | Admitting: Emergency Medicine

## 2011-11-03 ENCOUNTER — Emergency Department (HOSPITAL_COMMUNITY): Payer: Medicare Other

## 2011-11-03 ENCOUNTER — Inpatient Hospital Stay (HOSPITAL_COMMUNITY)
Admission: EM | Admit: 2011-11-03 | Discharge: 2011-11-08 | DRG: 637 | Disposition: A | Discharge status: Home / Self Care | Payer: Medicare Other | Attending: Internal Medicine | Admitting: Internal Medicine

## 2011-11-03 DIAGNOSIS — R6510 Systemic inflammatory response syndrome (SIRS) of non-infectious origin without acute organ dysfunction: Secondary | ICD-10-CM | POA: Diagnosis not present

## 2011-11-03 DIAGNOSIS — E870 Hyperosmolality and hypernatremia: Secondary | ICD-10-CM | POA: Diagnosis not present

## 2011-11-03 DIAGNOSIS — I129 Hypertensive chronic kidney disease with stage 1 through stage 4 chronic kidney disease, or unspecified chronic kidney disease: Secondary | ICD-10-CM | POA: Diagnosis present

## 2011-11-03 DIAGNOSIS — N189 Chronic kidney disease, unspecified: Secondary | ICD-10-CM | POA: Diagnosis present

## 2011-11-03 DIAGNOSIS — IMO0002 Reserved for concepts with insufficient information to code with codable children: Secondary | ICD-10-CM | POA: Diagnosis not present

## 2011-11-03 DIAGNOSIS — Z794 Long term (current) use of insulin: Secondary | ICD-10-CM

## 2011-11-03 DIAGNOSIS — E1310 Other specified diabetes mellitus with ketoacidosis without coma: Principal | ICD-10-CM | POA: Diagnosis present

## 2011-11-03 DIAGNOSIS — Z940 Kidney transplant status: Secondary | ICD-10-CM

## 2011-11-03 DIAGNOSIS — I10 Essential (primary) hypertension: Secondary | ICD-10-CM | POA: Diagnosis present

## 2011-11-03 DIAGNOSIS — G929 Unspecified toxic encephalopathy: Secondary | ICD-10-CM | POA: Diagnosis present

## 2011-11-03 DIAGNOSIS — N179 Acute kidney failure, unspecified: Secondary | ICD-10-CM | POA: Diagnosis present

## 2011-11-03 DIAGNOSIS — E119 Type 2 diabetes mellitus without complications: Secondary | ICD-10-CM | POA: Diagnosis present

## 2011-11-03 DIAGNOSIS — A419 Sepsis, unspecified organism: Secondary | ICD-10-CM | POA: Diagnosis not present

## 2011-11-03 DIAGNOSIS — Z09 Encounter for follow-up examination after completed treatment for conditions other than malignant neoplasm: Secondary | ICD-10-CM | POA: Diagnosis not present

## 2011-11-03 DIAGNOSIS — R5383 Other fatigue: Secondary | ICD-10-CM | POA: Diagnosis not present

## 2011-11-03 DIAGNOSIS — G92 Toxic encephalopathy: Secondary | ICD-10-CM | POA: Diagnosis present

## 2011-11-03 DIAGNOSIS — E1010 Type 1 diabetes mellitus with ketoacidosis without coma: Secondary | ICD-10-CM

## 2011-11-03 DIAGNOSIS — Z23 Encounter for immunization: Secondary | ICD-10-CM

## 2011-11-03 DIAGNOSIS — F17200 Nicotine dependence, unspecified, uncomplicated: Secondary | ICD-10-CM | POA: Diagnosis present

## 2011-11-03 DIAGNOSIS — E1100 Type 2 diabetes mellitus with hyperosmolarity without nonketotic hyperglycemic-hyperosmolar coma (NKHHC): Secondary | ICD-10-CM

## 2011-11-03 DIAGNOSIS — E872 Acidosis, unspecified: Secondary | ICD-10-CM | POA: Diagnosis present

## 2011-11-03 DIAGNOSIS — Z79899 Other long term (current) drug therapy: Secondary | ICD-10-CM

## 2011-11-03 DIAGNOSIS — R5381 Other malaise: Secondary | ICD-10-CM | POA: Diagnosis not present

## 2011-11-03 DIAGNOSIS — E1110 Type 2 diabetes mellitus with ketoacidosis without coma: Secondary | ICD-10-CM | POA: Diagnosis present

## 2011-11-03 DIAGNOSIS — E869 Volume depletion, unspecified: Secondary | ICD-10-CM | POA: Diagnosis present

## 2011-11-03 DIAGNOSIS — R918 Other nonspecific abnormal finding of lung field: Secondary | ICD-10-CM | POA: Diagnosis not present

## 2011-11-03 DIAGNOSIS — N289 Disorder of kidney and ureter, unspecified: Secondary | ICD-10-CM

## 2011-11-03 DIAGNOSIS — J9819 Other pulmonary collapse: Secondary | ICD-10-CM | POA: Diagnosis not present

## 2011-11-03 DIAGNOSIS — R4182 Altered mental status, unspecified: Secondary | ICD-10-CM | POA: Diagnosis present

## 2011-11-03 HISTORY — DX: Essential (primary) hypertension: I10

## 2011-11-03 LAB — URINE MICROSCOPIC-ADD ON

## 2011-11-03 LAB — CBC
HCT: 61.7 % — ABNORMAL HIGH (ref 36.0–46.0)
Platelets: 202 10*3/uL (ref 150–400)
Platelets: 215 10*3/uL (ref 150–400)
RBC: 5.83 MIL/uL — ABNORMAL HIGH (ref 3.87–5.11)
RDW: 15.8 % — ABNORMAL HIGH (ref 11.5–15.5)
WBC: 9.4 10*3/uL (ref 4.0–10.5)
WBC: 8 10*3/uL (ref 4.0–10.5)

## 2011-11-03 LAB — POCT I-STAT 3, ART BLOOD GAS (G3+)
O2 Saturation: 93 %
pCO2 arterial: 29.2 mmHg — ABNORMAL LOW (ref 35.0–45.0)
pH, Arterial: 7.262 — ABNORMAL LOW (ref 7.350–7.400)
pO2, Arterial: 75 mmHg — ABNORMAL LOW (ref 80.0–100.0)

## 2011-11-03 LAB — BASIC METABOLIC PANEL
BUN: 52 mg/dL — ABNORMAL HIGH (ref 6–23)
CO2: 12 mEq/L — ABNORMAL LOW (ref 19–32)
CO2: 19 mEq/L (ref 19–32)
Calcium: 11.2 mg/dL — ABNORMAL HIGH (ref 8.4–10.5)
Chloride: 108 mEq/L (ref 96–112)
Chloride: 119 mEq/L — ABNORMAL HIGH (ref 96–112)
Creatinine, Ser: 2.96 mg/dL — ABNORMAL HIGH (ref 0.50–1.10)
Glucose, Bld: 618 mg/dL (ref 70–99)
Sodium: 152 mEq/L — ABNORMAL HIGH (ref 135–145)

## 2011-11-03 LAB — COMPREHENSIVE METABOLIC PANEL
AST: 13 U/L (ref 0–37)
Albumin: 4.6 g/dL (ref 3.5–5.2)
Alkaline Phosphatase: 241 U/L — ABNORMAL HIGH (ref 39–117)
Chloride: 97 mEq/L (ref 96–112)
Potassium: 5.3 mEq/L — ABNORMAL HIGH (ref 3.5–5.1)
Total Bilirubin: 0.5 mg/dL (ref 0.3–1.2)

## 2011-11-03 LAB — GLUCOSE, CAPILLARY
Glucose-Capillary: >600 mg/dL (ref 70–99)
Glucose-Capillary: >600 mg/dL (ref 70–99)
Glucose-Capillary: >600 mg/dL (ref 70–99)
Glucose-Capillary: >600 mg/dL (ref 70–99)
Glucose-Capillary: >600 mg/dL (ref 70–99)
Glucose-Capillary: >600 mg/dL (ref 70–99)
Glucose-Capillary: 520 mg/dL — ABNORMAL HIGH (ref 70–99)

## 2011-11-03 LAB — POCT I-STAT, CHEM 8
Chloride: 114 mEq/L — ABNORMAL HIGH (ref 96–112)
Glucose, Bld: >700 mg/dL (ref 70–99)
HCT: 52 % — ABNORMAL HIGH (ref 36.0–46.0)
Potassium: 5 mEq/L (ref 3.5–5.1)
Sodium: 139 mEq/L (ref 135–145)

## 2011-11-03 LAB — LACTIC ACID, PLASMA: Lactic Acid, Venous: 2.3 mmol/L — ABNORMAL HIGH (ref 0.5–2.2)

## 2011-11-03 LAB — DIFFERENTIAL
Basophils Absolute: 0 10*3/uL (ref 0.0–0.1)
Lymphocytes Relative: 5 % — ABNORMAL LOW (ref 12–46)
Monocytes Relative: 3 % (ref 3–12)
WBC Morphology: INCREASED

## 2011-11-03 LAB — URINALYSIS, ROUTINE W REFLEX MICROSCOPIC
Glucose, UA: >1000 mg/dL — AB
Ketones, ur: NEGATIVE mg/dL
Protein, ur: NEGATIVE mg/dL

## 2011-11-03 MED ORDER — SODIUM CHLORIDE 0.9 % IV SOLN: INTRAVENOUS | Status: DC

## 2011-11-03 MED ORDER — SODIUM CHLORIDE 0.9 % IV SOLN
INTRAVENOUS | Status: DC
  Administered 2011-11-03: 175 mL/h via INTRAVENOUS

## 2011-11-03 MED ORDER — SODIUM CHLORIDE 0.9 % IV SOLN
INTRAVENOUS | Status: DC
  Administered 2011-11-03: 5.4 [IU]/h via INTRAVENOUS
  Filled 2011-11-03 (×2): qty 1

## 2011-11-03 MED ORDER — ALBUTEROL SULFATE (5 MG/ML) 0.5% IN NEBU: 2.5000 mg | INHALATION_SOLUTION | RESPIRATORY_TRACT | Status: DC | PRN

## 2011-11-03 MED ORDER — ACETAMINOPHEN 325 MG PO TABS
650.0000 mg | ORAL_TABLET | Freq: Four times a day (QID) | ORAL | Status: DC | PRN
  Administered 2011-11-06 – 2011-11-08 (×4): 650 mg via ORAL
  Filled 2011-11-03 (×4): qty 2

## 2011-11-03 MED ORDER — LIDOCAINE HCL 1 % IJ SOLN
5.0000 mL | Freq: Once | INTRAMUSCULAR | Status: DC
  Filled 2011-11-03: qty 5

## 2011-11-03 MED ORDER — DEXTROSE-NACL 5-0.45 % IV SOLN: INTRAVENOUS | Status: DC

## 2011-11-03 MED ORDER — TACROLIMUS 1 MG PO CAPS
3.0000 mg | ORAL_CAPSULE | Freq: Every day | ORAL | Status: DC
  Administered 2011-11-04: 3 mg via ORAL
  Filled 2011-11-03 (×2): qty 3

## 2011-11-03 MED ORDER — ONDANSETRON HCL 4 MG/2ML IJ SOLN: 4.0000 mg | Freq: Three times a day (TID) | INTRAMUSCULAR | Status: DC | PRN

## 2011-11-03 MED ORDER — SODIUM CHLORIDE 0.9 % IV SOLN
INTRAVENOUS | Status: AC
  Administered 2011-11-03: 37.8 [IU]/h via INTRAVENOUS
  Administered 2011-11-04: 29.9 [IU]/h via INTRAVENOUS
  Administered 2011-11-04: 13.4 [IU]/h via INTRAVENOUS
  Filled 2011-11-03 (×5): qty 1

## 2011-11-03 MED ORDER — SODIUM CHLORIDE 0.9 % IV SOLN
Freq: Once | INTRAVENOUS | Status: AC
  Administered 2011-11-03: 14:00:00 via INTRAVENOUS

## 2011-11-03 MED ORDER — HYDROCORTISONE SOD SUCCINATE 100 MG IJ SOLR
25.0000 mg | Freq: Every day | INTRAMUSCULAR | Status: DC
  Administered 2011-11-03: 25 mg via INTRAVENOUS
  Filled 2011-11-03 (×2): qty 0.5

## 2011-11-03 MED ORDER — CHLORHEXIDINE GLUCONATE 0.12 % MT SOLN
15.0000 mL | Freq: Two times a day (BID) | OROMUCOSAL | Status: DC
  Administered 2011-11-04: 15 mL via OROMUCOSAL
  Filled 2011-11-03: qty 15

## 2011-11-03 MED ORDER — TACROLIMUS 1 MG PO CAPS
5.0000 mg | ORAL_CAPSULE | Freq: Two times a day (BID) | ORAL | Status: DC
  Administered 2011-11-04 – 2011-11-06 (×6): 5 mg via ORAL
  Filled 2011-11-03 (×10): qty 5

## 2011-11-03 MED ORDER — BIOTENE DRY MOUTH MT LIQD: 15.0000 mL | Freq: Two times a day (BID) | OROMUCOSAL | Status: DC

## 2011-11-03 MED ORDER — WHITE PETROLATUM GEL
Status: AC
  Administered 2011-11-03
  Filled 2011-11-03: qty 5

## 2011-11-03 MED ORDER — ASPIRIN 300 MG RE SUPP
300.0000 mg | Freq: Every day | RECTAL | Status: DC
  Administered 2011-11-03: 300 mg via RECTAL
  Filled 2011-11-03 (×2): qty 1

## 2011-11-03 MED ORDER — SODIUM CHLORIDE 0.9 % IV BOLUS (SEPSIS)
1000.0000 mL | Freq: Once | INTRAVENOUS | Status: AC
  Administered 2011-11-03: 1000 mL via INTRAVENOUS

## 2011-11-03 MED ORDER — VALGANCICLOVIR HCL 450 MG PO TABS
450.0000 mg | ORAL_TABLET | ORAL | Status: DC
  Filled 2011-11-03: qty 1

## 2011-11-03 MED ORDER — MIDAZOLAM HCL 2 MG/2ML IJ SOLN
INTRAMUSCULAR | Status: AC
  Administered 2011-11-03: 2 mg
  Filled 2011-11-03: qty 4

## 2011-11-03 MED ORDER — ONDANSETRON HCL 4 MG/2ML IJ SOLN
4.0000 mg | Freq: Three times a day (TID) | INTRAMUSCULAR | Status: DC | PRN
  Administered 2011-11-03: 4 mg via INTRAVENOUS
  Filled 2011-11-03: qty 2

## 2011-11-03 MED ORDER — POTASSIUM CHLORIDE CRYS ER 20 MEQ PO TBCR: 40.0000 meq | EXTENDED_RELEASE_TABLET | Freq: Once | ORAL | Status: DC

## 2011-11-03 MED ORDER — POTASSIUM CHLORIDE 10 MEQ/50ML IV SOLN
10.0000 meq | INTRAVENOUS | Status: AC
  Administered 2011-11-04 (×4): 10 meq via INTRAVENOUS
  Filled 2011-11-03: qty 200

## 2011-11-03 MED ORDER — DEXTROSE 5 % IV SOLN
1.0000 g | Freq: Once | INTRAVENOUS | Status: AC
  Administered 2011-11-03: 1 g via INTRAVENOUS
  Filled 2011-11-03: qty 10

## 2011-11-03 MED ORDER — MYCOPHENOLATE SODIUM 180 MG PO TBEC
180.0000 mg | DELAYED_RELEASE_TABLET | Freq: Two times a day (BID) | ORAL | Status: DC
  Administered 2011-11-04: 180 mg via ORAL
  Filled 2011-11-03 (×3): qty 1

## 2011-11-03 MED ORDER — VANCOMYCIN HCL 1000 MG IV SOLR
1750.0000 mg | INTRAVENOUS | Status: DC
  Filled 2011-11-03: qty 1750

## 2011-11-03 MED ORDER — VANCOMYCIN HCL 1000 MG IV SOLR
1750.0000 mg | INTRAVENOUS | Status: DC
  Administered 2011-11-03: 1750 mg via INTRAVENOUS
  Filled 2011-11-03: qty 1750

## 2011-11-03 MED ORDER — HEPARIN SODIUM (PORCINE) 5000 UNIT/ML IJ SOLN
5000.0000 [IU] | Freq: Three times a day (TID) | INTRAMUSCULAR | Status: DC
  Administered 2011-11-03 – 2011-11-04 (×4): 5000 [IU] via SUBCUTANEOUS
  Filled 2011-11-03 (×6): qty 1

## 2011-11-03 MED ORDER — LEVOFLOXACIN IN D5W 750 MG/150ML IV SOLN
750.0000 mg | INTRAVENOUS | Status: DC
  Administered 2011-11-03: 750 mg via INTRAVENOUS
  Filled 2011-11-03: qty 150

## 2011-11-03 MED ORDER — LIDOCAINE HCL (PF) 1 % IJ SOLN: 5.0000 mL | Freq: Once | INTRAMUSCULAR | Status: DC

## 2011-11-03 MED ORDER — DEXTROSE-NACL 5-0.45 % IV SOLN
INTRAVENOUS | Status: DC
  Administered 2011-11-04: 02:00:00 via INTRAVENOUS

## 2011-11-03 MED ORDER — DEXTROSE 5 % IV SOLN
500.0000 mg | Freq: Once | INTRAVENOUS | Status: AC
  Administered 2011-11-03: 500 mg via INTRAVENOUS
  Filled 2011-11-03 (×2): qty 500

## 2011-11-03 MED ORDER — HYDRALAZINE HCL 20 MG/ML IJ SOLN
10.0000 mg | Freq: Four times a day (QID) | INTRAMUSCULAR | Status: DC | PRN
  Filled 2011-11-03: qty 0.5

## 2011-11-03 MED ORDER — DEXTROSE 50 % IV SOLN: 25.0000 mL | INTRAVENOUS | Status: DC | PRN

## 2011-11-03 MED ORDER — LIDOCAINE HCL (PF) 1 % IJ SOLN
INTRAMUSCULAR | Status: AC
  Administered 2011-11-03: 5 mL
  Filled 2011-11-03: qty 5

## 2011-11-03 MED ORDER — MIDAZOLAM HCL 2 MG/2ML IJ SOLN: 4.0000 mg | Freq: Once | INTRAMUSCULAR | Status: DC

## 2011-11-03 MED ORDER — PANTOPRAZOLE SODIUM 40 MG IV SOLR
40.0000 mg | Freq: Every day | INTRAVENOUS | Status: DC
  Administered 2011-11-03: 40 mg via INTRAVENOUS
  Filled 2011-11-03 (×2): qty 40

## 2011-11-03 MED ORDER — MIDAZOLAM HCL 2 MG/2ML IJ SOLN
INTRAMUSCULAR | Status: AC
  Administered 2011-11-03: 2 mg
  Filled 2011-11-03: qty 2

## 2011-11-03 MED ORDER — DEXTROSE 5 % IV SOLN
1.0000 g | INTRAVENOUS | Status: DC
  Administered 2011-11-03: 1 g via INTRAVENOUS
  Filled 2011-11-03 (×2): qty 1

## 2011-11-03 MED ORDER — ONDANSETRON HCL 4 MG/2ML IJ SOLN
4.0000 mg | Freq: Once | INTRAMUSCULAR | Status: AC
  Administered 2011-11-03: 4 mg via INTRAVENOUS
  Filled 2011-11-03: qty 2

## 2011-11-03 NOTE — Progress Notes (Signed)
[facility] Progress Note Patient Name: [name] DOB: July 11, 1965 MRN: [id number]  Date of Service  11/03/2011   HPI/Events of Note   Ct Head Wo Contrast  11/03/2011  *RADIOLOGY REPORT*  Clinical Data: Altered mental status.  Weakness.  CT HEAD WITHOUT CONTRAST  Technique:  Contiguous axial images were obtained from the base of the skull through the vertex without contrast.  Comparison: 02/22/2007.  Findings: The subarachnoid spaces remain minimally prominent. Normal size and position of the ventricles.  Low density in the central pons, in a region of streak artifacts.  There is also some low density in the central pontomesencephalic junction region. There are less streak artifacts in that region.  No intracranial hemorrhage or mass lesion seen.  Unremarkable bones and paranasal sinuses.  IMPRESSION: Low density in the central pons and central pontomesencephalic region.  This could be artifactual in nature or represent subacute ischemic changes.  If clinically indicated, pre and postcontrast magnetic resonance imaging of the brain and MRA may be helpful.  Original Report Authenticated By: Gerald Stabs, M.D.   Dg Chest Port 1 View  11/03/2011  *RADIOLOGY REPORT*  Clinical Data: Left subclavian central line placement.  PORTABLE CHEST - 1 VIEW  Comparison: 11/03/2011.  Findings: 2052 hours.  Left subclavian central venous catheter tip is within the mid SVC near the azygos vein.  The heart size and mediastinal contours are stable. There is no pneumothorax.  There are persistent low lung volumes with patchy bibasilar air space opacities.  IMPRESSION:  1.  Central line placement without demonstrated complication. 2.  Unchanged bibasilar air space opacities.  Original Report Authenticated By: Vivia Ewing, M.D.   Dg Chest Portable 1 View  11/03/2011  *RADIOLOGY REPORT*  Clinical Data: Altered mental status, lethargy and weakness. Hyperglycemia.  PORTABLE CHEST - 1 VIEW  Comparison:  05/22/2009  Findings: Lung volumes are very low with bibasilar atelectasis present.  It would be difficult to exclude subtle bibasilar infiltrates.  No gross edema or pleural fluid.  Stable cardiomegaly.  IMPRESSION: Bibasilar atelectasis versus infiltrates.  No edema.  Original Report Authenticated By: Azzie Roup, M.D.     eICU Interventions  Ok to use cvl   Intervention Category Intermediate Interventions: Diagnostic test evaluation  Jaydence Vanyo 11/03/2011, 10:14 PM

## 2011-11-03 NOTE — Progress Notes (Signed)
CRITICAL VALUE ALERT  Critical value received:  Glucose 1225  Date of notification:  11/03/2011  Time of notification:  6:35 PM   Critical value read back:yes  Nurse who received alert:  Willadean Carol   MD notified (1st page):  ghimire   Time of first page:  6:36 PM   MD notified (2nd page):  Time of second page:  Responding MD:  ghimire  Time MD responded:  6:36 PM

## 2011-11-03 NOTE — ED Notes (Signed)
Family reports pt c/o felling weak yesterday but was verbal, today pt woke up today with altered mental status. Pt lethargic in triage. CBG reads critical high.

## 2011-11-03 NOTE — Procedures (Signed)
Central Venous Catheter Insertion Procedure Note [name] [id number] June 22, 1966  Procedure: Insertion of Central Venous Catheter Indications: Drug and/or fluid administration and Frequent blood sampling  Procedure Details Consent: Risks of procedure as well as the alternatives and risks of each were explained to the (patient/caregiver).  Consent for procedure obtained. Time Out: Verified patient identification, verified procedure, site/side was marked, verified correct patient position, special equipment/implants available, medications/allergies/relevent history reviewed, required imaging and test results available.  Performed  Maximum sterile technique was used including antiseptics, cap, gloves, gown, hand hygiene, mask and sheet. Skin prep: Chlorhexidine; local anesthetic administered A antimicrobial bonded/coated triple lumen catheter was placed in the left subclavian vein using the Seldinger technique.  Evaluation Blood flow good Complications: No apparent complications Patient did tolerate procedure well. Chest X-ray ordered to verify placement.  CXR: normal.  [name], M.D. Pulmonary and Critical Care Medicine Call [facility] with questions [phone] 11/03/2011, 9:13 PM

## 2011-11-03 NOTE — ED Notes (Signed)
Patient transported to CT 

## 2011-11-03 NOTE — ED Provider Notes (Signed)
History     CSN: [id number]  Arrival date & time 11/03/11  1140   First MD Initiated Contact with Patient 11/03/11 1150      Chief Complaint  Patient presents with  . Weakness    (Consider location/radiation/quality/duration/timing/severity/associated sxs/prior treatment) HPI Comments: Level 5 caveat due to altered mental status. No history of diabetes. Patient is s/p renal transplant.   The history is provided by a relative.    Past Medical History  Diagnosis Date  . Renal disorder   . Hypertension     Past Surgical History  Procedure Date  . Kidney transplant   . Dg av dialysis  shunt access exist*l* or   . Cholecystectomy     History reviewed. No pertinent family history.  History  Substance Use Topics  . Smoking status: Current Everyday Smoker  . Smokeless tobacco: Not on file  . Alcohol Use: No    OB History    Grav Para Term Preterm Abortions TAB SAB Ect Mult Living                  Review of Systems  Unable to perform ROS: Mental status change    Allergies  Review of patient's allergies indicates not on file.  Home Medications  No current outpatient prescriptions on file.  Pulse 120  Temp(Src) 98.4 F (36.9 C) (Oral)  Resp 16  SpO2 96%  Physical Exam  Nursing note and vitals reviewed. Constitutional: She appears well-developed and well-nourished. She appears lethargic.       obtunded  HENT:  Head: Normocephalic and atraumatic.  Mouth/Throat: Uvula is midline and oropharynx is clear and moist. Mucous membranes are dry.  Eyes: Conjunctivae are normal. Pupils are equal, round, and reactive to light. Right eye exhibits no discharge. Left eye exhibits no discharge.  Neck: Normal range of motion. Neck supple.  Cardiovascular: Regular rhythm and normal heart sounds.  Tachycardia present.   Pulmonary/Chest: Effort normal and breath sounds normal. No respiratory distress. She has no wheezes.  Abdominal: Soft. There is no tenderness. There is no  rebound and no guarding.  Musculoskeletal: She exhibits no edema and no tenderness.  Neurological: She appears lethargic.       Responsive to loud voice  Skin: Skin is warm and dry. She is not diaphoretic.  Psychiatric:       obtunded    ED Course  Procedures (including critical care time)  Labs Reviewed  GLUCOSE, CAPILLARY - Abnormal; Notable for the following:    Glucose-Capillary >600 (*)    All other components within normal limits  CBC - Abnormal; Notable for the following:    RBC 5.63 (*)    Hemoglobin 16.7 (*)    HCT 61.7 (*)    MCV 109.6 (*)    MCHC 27.1 (*)    RDW 15.8 (*)    All other components within normal limits  DIFFERENTIAL - Abnormal; Notable for the following:    Neutrophils Relative 92 (*)    Lymphocytes Relative 5 (*)    Neutro Abs 8.6 (*)    Lymphs Abs 0.5 (*)    All other components within normal limits  COMPREHENSIVE METABOLIC PANEL - Abnormal; Notable for the following:    Potassium 5.3 (*)    CO2 13 (*)    Glucose, Bld 1482 (*)    BUN 53 (*)    Creatinine, Ser 3.32 (*)    Calcium 11.5 (*)    Alkaline Phosphatase 241 (*)    GFR calc  non Af Amer 15 (*)    GFR calc Af Amer 18 (*)    All other components within normal limits  URINALYSIS, ROUTINE W REFLEX MICROSCOPIC - Abnormal; Notable for the following:    APPearance CLOUDY (*)    Specific Gravity, Urine 1.031 (*)    Glucose, UA >1000 (*)    Hgb urine dipstick MODERATE (*)    All other components within normal limits  LACTIC ACID, PLASMA - Abnormal; Notable for the following:    Lactic Acid, Venous 2.3 (*)    All other components within normal limits  POCT I-STAT, CHEM 8 - Abnormal; Notable for the following:    Chloride 114 (*)    BUN 52 (*)    Creatinine, Ser 3.40 (*)    Glucose, Bld >700 (*)    Calcium, Ion 1.41 (*)    Hemoglobin 17.7 (*)    HCT 52.0 (*)    All other components within normal limits  POCT I-STAT 3, BLOOD GAS (G3+) - Abnormal; Notable for the following:    pH,  Arterial 7.262 (*)    pCO2 arterial 29.2 (*)    pO2, Arterial 75.0 (*)    Bicarbonate 13.2 (*)    Acid-base deficit 12.0 (*)    All other components within normal limits  URINE MICROSCOPIC-ADD ON  POCT PREGNANCY, URINE  OSMOLALITY   Dg Chest Portable 1 View  11/03/2011  *RADIOLOGY REPORT*  Clinical Data: Altered mental status, lethargy and weakness. Hyperglycemia.  PORTABLE CHEST - 1 VIEW  Comparison: 05/22/2009  Findings: Lung volumes are very low with bibasilar atelectasis present.  It would be difficult to exclude subtle bibasilar infiltrates.  No gross edema or pleural fluid.  Stable cardiomegaly.  IMPRESSION: Bibasilar atelectasis versus infiltrates.  No edema.  Original Report Authenticated By: Azzie Roup, M.D.     1. DKA (diabetic ketoacidoses)     11:59 AM Patient seen and examined. Work-up initiated. Medications ordered.   Vital signs reviewed and are as follows: Filed Vitals:   11/03/11 1146  Pulse: 120  Temp:   Resp:   Pulse 120  Temp(Src) 98.4 F (36.9 C) (Oral)  Resp 16  SpO2 96%  Patient is maintaining airway. She is not hypoxic. Patient d/w Dr. Lacinda Axon who will see. Labs ordered.   12:40 PM Patient re-examined. Istat obtained. Anion gap normal. Crt= 3.4. No rales on exam. O2 sat is 98% CXR suspicious for PNA. Radiologist not calling PNA. Will treat given bandemia. Do not suspect fluid overload at this point, will monitor.   1:45 PM [facility] to admit. Asked to order blood cultures by admitting service. Patient has improving mentation. Blood sugar 1400's. HR 110.    Date: 11/03/2011  Rate: 114  Rhythm: sinus tachycardia  QRS Axis: left  Intervals: PR prolonged  ST/T Wave abnormalities: nonspecific T wave changes  Conduction Disutrbances:first-degree A-V block   Narrative Interpretation:   Old EKG Reviewed: none available  CRITICAL CARE Performed by: Faustino Congress   Total critical care time: 60 minutes  Critical care time was exclusive of  separately billable procedures and treating other patients.  Critical care was necessary to treat or prevent imminent or life-threatening deterioration.  Critical care was time spent personally by me on the following activities: development of treatment plan with patient and/or surrogate as well as nursing, discussions with consultants, evaluation of patient's response to treatment, examination of patient, obtaining history from patient or surrogate, ordering and performing treatments and interventions, ordering and review of laboratory  studies, ordering and review of radiographic studies, pulse oximetry and re-evaluation of patient's condition.     MDM  Hyperosmolar state with element of acidosis, renal insufficiency. Will treat for PNA given suspicious findings on CXR, bandemia, although radiologist read not conclusive for PNA.        Carlisle Cater, [place] 11/03/11 [place], [place] 11/03/11 [place], [place] 11/03/11 1418

## 2011-11-03 NOTE — H&P (Signed)
PATIENT DETAILS Name: [name] Age: 46 y.o. Sex: female Date of Birth: 1965-11-17 Admit Date: 11/03/2011 PCP:No primary provider on file.   CHIEF COMPLAINT:  Altered Mental Status  HPI: 46 year old African American Female with past medical history of Renal Transplant-on immunosuppressives, HTN comes in with the above complaints. Patient although lethargic and restless, is able to follow commands and answer almost all my questions appropriately, but is not able to fully participate in the history taking process. History is taken from patient's daughter at bedside, apparently for the past 3-4 weeks, patient has been feeling very weak. Over the Easter holidays she developed "cold like symptoms" and was having a nagging cough. However over the past 2 days she became more weak, apparently was weak and needed assistance walking to the bathroom,she however was alert and awake. The daughter claims that over the past few weeks patient has complained of increased thirst,frequent urination as well. This morning the daughter found the patient very lethargic and very altered and was brought to the ED. She was found to be tachycardic, had elevated blood sugars and I was called to admit the patient. During my evaluation,the daughter claimed that patient was slowly getting better and was answering questions appropriately, the RN also confirmed that during the ED stay patient has already shown clinical improvement.Upon repeatedly questioning the patient-the patient denies-Headache, Neck pain, Fever, shortness of breath, chest pain,abd pain,nausea/vomiting or diarrhea.She is not a known diabetic, and has had a renal transplant at [facility] approximately 1.5 years back. Unknown what her baseline renal function is like after transplant.    ALLERGIES:   Allergies  Allergen Reactions  . Ancef (Cefazolin Sodium) Other (See Comments)    unknown  . Other     Pt may be allergic to another antibiotic daughter isn't  sure and pt unable to verify.    PAST MEDICAL HISTORY: Past Medical History  Diagnosis Date  . Renal disorder   . Hypertension     PAST SURGICAL HISTORY: Past Surgical History  Procedure Date  . Kidney transplant   . Dg av dialysis  shunt access exist*l* or   . Cholecystectomy     MEDICATIONS AT HOME: Prior to Admission medications   Medication Sig Start Date End Date Taking? Authorizing Provider  amLODipine (NORVASC) 10 MG tablet Take 10 mg by mouth daily.   Yes Historical Provider, MD  cloNIDine (CATAPRES) 0.2 MG tablet Take 0.2 mg by mouth 3 (three) times daily.   Yes Historical Provider, MD  labetalol (NORMODYNE) 200 MG tablet Take 200 mg by mouth 3 (three) times daily.   Yes Historical Provider, MD  mycophenolate (MYFORTIC) 180 MG EC tablet Take by mouth 2 (two) times daily.   Yes Historical Provider, MD  nystatin (MYCOSTATIN) 100000 UNIT/ML suspension Take 500,000 Units by mouth 3 (three) times daily. Swish and swallow   Yes Historical Provider, MD  omeprazole (PRILOSEC) 20 MG capsule Take 20 mg by mouth daily.   Yes Historical Provider, MD  predniSONE (DELTASONE) 5 MG tablet Take 5 mg by mouth daily.   Yes Historical Provider, MD  sulfamethoxazole-trimethoprim (BACTRIM,SEPTRA) 400-80 MG per tablet Take 1 tablet by mouth 3 (three) times a week. Monday Wednesday and friday   Yes Historical Provider, MD  tacrolimus (PROGRAF) 1 MG capsule Take 3 mg by mouth daily.   Yes Historical Provider, MD  tacrolimus (PROGRAF) 5 MG capsule Take 5 mg by mouth 2 (two) times daily.   Yes Historical Provider, MD  valGANciclovir (VALCYTE)  450 MG tablet Take 450 mg by mouth 3 (three) times a week. Monday Wednesday and Friday   Yes Historical Provider, MD    FAMILY HISTORY: History reviewed. No pertinent family history.  SOCIAL HISTORY:  reports that she has been smoking.  She does not have any smokeless tobacco history on file. She reports that she does not drink alcohol. Her drug history not  on file.  REVIEW OF SYSTEMS:  Constitutional:   No  weight loss, night sweats,  Fevers, chills, fatigue.  HEENT:    No headaches, Difficulty swallowing,Tooth/dental problems,Sore throat,  No sneezing, itching, ear ache, nasal congestion, post nasal drip  Cardio-vascular: No chest pain,  Orthopnea, PND, swelling in lower extremities, anasarca, dizziness, palpitations  GI:  No heartburn, indigestion, abdominal pain, nausea, vomiting, diarrhea, change in bowel habits, loss of appetite  Resp: No shortness of breath with exertion or at rest.  No excess mucus, no productive cough, No non-productive cough,  No coughing up of blood.No change in color of mucus.No wheezing.No chest wall deformity  Skin:  no rash or lesions.  GU:  no dysuria, change in color of urine, no urgency or frequency.  No flank pain.  Musculoskeletal: No joint pain or swelling.  No decreased range of motion.  No back pain.  Psych: No change in mood or affect. No depression or anxiety.  No memory loss.   PHYSICAL EXAM: Blood pressure 112/66, pulse 105, temperature 98 F (36.7 C), temperature source Oral, resp. rate 26, last menstrual period 10/08/2011, SpO2 95.00%.  General appearance :Lethargic and somewhat alert HEENT: Atraumatic and Normocephalic, pupils equally reactive to light and accomodation Neck: supple, no JVD. No cervical lymphadenopathy.  Chest:Good air entry bilaterally, no added sounds  CVS: S1 S2 regular, no murmurs.  Abdomen: Bowel sounds present, Non tender and not distended with no gaurding, rigidity or rebound. Extremities: B/L Lower Ext shows no edema, both legs are warm to touch, with  dorsalis pedis pulses palpable. Neurology: Awake alert, and oriented X 3, CN II-XII intact, Non focal Skin:No Rash Wounds:N/A  LABS ON ADMISSION:   Basename 11/03/11 1237 11/03/11 1217  NA 139 137  K 5.0 5.3*  CL 114* 97  CO2 -- 13*  GLUCOSE >700* 1482*  BUN 52* 53*  CREATININE 3.40* 3.32*    CALCIUM -- 11.5*  MG -- --  PHOS -- --    Basename 11/03/11 1217  AST 13  ALT 14  ALKPHOS 241*  BILITOT 0.5  PROT 8.2  ALBUMIN 4.6   No results found for this basename: LIPASE:2,AMYLASE:2 in the last 72 hours  Basename 11/03/11 1237 11/03/11 1217  WBC -- 9.4  NEUTROABS -- 8.6*  HGB 17.7* 16.7*  HCT 52.0* 61.7*  MCV -- 109.6*  PLT -- 202   No results found for this basename: CKTOTAL:3,CKMB:3,CKMBINDEX:3,TROPONINI:3 in the last 72 hours No results found for this basename: DDIMER:2 in the last 72 hours No components found with this basename: POCBNP:3   RADIOLOGIC STUDIES ON ADMISSION: Dg Chest Portable 1 View  11/03/2011  *RADIOLOGY REPORT*  Clinical Data: Altered mental status, lethargy and weakness. Hyperglycemia.  PORTABLE CHEST - 1 VIEW  Comparison: 05/22/2009  Findings: Lung volumes are very low with bibasilar atelectasis present.  It would be difficult to exclude subtle bibasilar infiltrates.  No gross edema or pleural fluid.  Stable cardiomegaly.  IMPRESSION: Bibasilar atelectasis versus infiltrates.  No edema.  Original Report Authenticated By: Azzie Roup, M.D.    ASSESSMENT AND PLAN: Present on Admission:  .  DKA (diabetic ketoacidoses) -Likely has undiagnosed DM given above history, now in DKA -will place on Insulin drip per protocol -she has already received vigorous hydration with 3 L normal saline bolus in the ED -will check HbA1C  .Altered mental state -likely secondary to toxic metabolic encephalopathy-likely from DKA, however has elevated bands in differential count-therefore cannot rule out SIR's/Sepsis -will check CT Head -patient is afebrile, neck is supple, at this point I dont think that patient has meningitis/encephalitis, for now I will empirically treat with antibiotics, and assess clinical course. If mentation does not improve with correction of electrolyte abnormalities, this patient may need a MRI brain and if mentation allows may need to  pursue a Lumbar Puncture  .Acute-on-chronic renal failure -is a transplant patient, hydrate and recheck lytes -unknown baseline following transplant -get [facility] -if renal functions worsens will need to consult Renal team  .HTN (hypertension) -hold BP meds till able to take oral meds once mentation improved  SIRS/?Sepsis -not sure whether this is non-infectious(DKA) or infectious, questionable PNA on CXR, will empirically on Vanco/Cefepime and Levaquin -if clinically improves in next 24 hours-will need to de-escalate antibiotics  Adrenal Insufficiency -on chroinc prednisone following renal transplan -will give low dose hydrocortisone while acutely ill  S/P Renal Transplant Continue with immunosuppressives when patient mentation allows  Further plan will depend as patient's clinical course evolves and further radiologic and laboratory data become available. Patient will be monitored closely.  DVT Prophylaxis: Subcutaneous Heparin  Code Status: Full Code  Total time spent for admission equals 45 minutes.  Oren Binet 11/03/2011, 3:46 PM  w

## 2011-11-03 NOTE — Progress Notes (Addendum)
CRITICAL VALUE ALERT  Critical value received: Glucose 618  Date of notification:  11/03/11  Time of notification:  2325  Critical value read back:yes  Nurse who received alert:  Patricia Pesa, RN  MD notified (1st page):  Walden Field, NP  Time of first page:  [phone]  MD notified (2nd page):  Time of second page:  Responding MD:  Walden Field, NP  Time MD responded:  2330

## 2011-11-03 NOTE — ED Provider Notes (Signed)
Medical screening examination/treatment/procedure(s) were conducted as a shared visit with non-physician practitioner(s) and myself.  I personally evaluated the patient during the encounter.  New Onset diabetes.  Glucose greater than 1400.  Will hydrate, IV insulin. Suspect early pneumonia on chest x-ray  Nat Christen, MD 11/03/11 1541

## 2011-11-03 NOTE — Progress Notes (Addendum)
ANTIBIOTIC CONSULT NOTE - INITIAL  Pharmacy Consult for vancomycin/cefepime Indication: r/o PNA  Allergies  Allergen Reactions  . Ancef (Cefazolin Sodium) Other (See Comments)    unknown  . Other     Pt may be allergic to another antibiotic daughter isn't sure and pt unable to verify.    Patient Measurements:  Pt wt as reported by RN = 136 kg  Vital Signs: Temp: 98 F (36.7 C) (04/28 1437) Temp src: Oral (04/28 1437) BP: 110/59 mmHg (04/28 1500) Pulse Rate: 105  (04/28 1500) Intake/Output from previous day:   Intake/Output from this shift: Total I/O In: 3000 [I.V.:3000] Out: -   Labs:  Basename 11/03/11 1237 11/03/11 1217  WBC -- 9.4  HGB 17.7* 16.7*  PLT -- 202  LABCREA -- --  CREATININE 3.40* 3.32*   CrCl is unknown because there is no height on file for the current visit. No results found for this basename: VANCOTROUGH:2,VANCOPEAK:2,VANCORANDOM:2,GENTTROUGH:2,GENTPEAK:2,GENTRANDOM:2,TOBRATROUGH:2,TOBRAPEAK:2,TOBRARND:2,AMIKACINPEAK:2,AMIKACINTROU:2,AMIKACIN:2, in the last 72 hours   Microbiology: No results found for this or any previous visit (from the past 720 hour(s)).  Medical History: Past Medical History  Diagnosis Date  . Renal disorder   . Hypertension     Medications:  Prescriptions prior to admission  Medication Sig Dispense Refill  . amLODipine (NORVASC) 10 MG tablet Take 10 mg by mouth daily.      . cloNIDine (CATAPRES) 0.2 MG tablet Take 0.2 mg by mouth 3 (three) times daily.      [name] labetalol (NORMODYNE) 200 MG tablet Take 200 mg by mouth 3 (three) times daily.      . mycophenolate (MYFORTIC) 180 MG EC tablet Take by mouth 2 (two) times daily.      [name] nystatin (MYCOSTATIN) 100000 UNIT/ML suspension Take 500,000 Units by mouth 3 (three) times daily. Swish and swallow      . omeprazole (PRILOSEC) 20 MG capsule Take 20 mg by mouth daily.      . predniSONE (DELTASONE) 5 MG tablet Take 5 mg by mouth daily.      [name] sulfamethoxazole-trimethoprim  (BACTRIM,SEPTRA) 400-80 MG per tablet Take 1 tablet by mouth 3 (three) times a week. Monday Wednesday and friday      . tacrolimus (PROGRAF) 1 MG capsule Take 3 mg by mouth daily.      . tacrolimus (PROGRAF) 5 MG capsule Take 5 mg by mouth 2 (two) times daily.      . valGANciclovir (VALCYTE) 450 MG tablet Take 450 mg by mouth 3 (three) times a week. Monday Wednesday and Friday       Assessment: 46 yo F admitted with AMS found to be in DKA to start antibiotics for r/o PNA.  Also noted to have CKD (h/o renal transplant) with CrCl ~25 ml/min.  Patient with reported ancef allergy but tolerated Rocephin (and azithromycin) in ED today - OK with MD to give cefepime.  Goal of Therapy:  Vancomycin trough level 15-20 mcg/ml  Plan:  1.  Vancomycin 1750 mg IV q48h 2.  Cefepime 1g IV q24h 3.  F/u renal function, cx, clinical progress  Coti Burd L. Amada Jupiter, PharmD, [place] Clinical Pharmacist Pager: [phone] 11/03/2011 4:39 PM   Addendum: also adding levofloxacin.  Will give 750 mg IV q48h.   Danuel Felicetti L. Amada Jupiter, PharmD, [place] Clinical Pharmacist Pager: [phone] 11/03/2011 5:03 PM

## 2011-11-03 NOTE — ED Notes (Signed)
Pt very confused, moving around in bed, and attempting to pull out IV lines. Charge nurse aware and sts will call for a Air cabin crew.

## 2011-11-03 NOTE — ED Notes (Signed)
cbg reads critical high Varney Biles RN aware during assessment.

## 2011-11-04 DIAGNOSIS — R6510 Systemic inflammatory response syndrome (SIRS) of non-infectious origin without acute organ dysfunction: Secondary | ICD-10-CM | POA: Diagnosis present

## 2011-11-04 DIAGNOSIS — E870 Hyperosmolality and hypernatremia: Secondary | ICD-10-CM | POA: Diagnosis not present

## 2011-11-04 DIAGNOSIS — E872 Acidosis, unspecified: Secondary | ICD-10-CM | POA: Diagnosis present

## 2011-11-04 DIAGNOSIS — E869 Volume depletion, unspecified: Secondary | ICD-10-CM | POA: Diagnosis present

## 2011-11-04 DIAGNOSIS — E119 Type 2 diabetes mellitus without complications: Secondary | ICD-10-CM | POA: Diagnosis present

## 2011-11-04 LAB — BASIC METABOLIC PANEL
BUN: 57 mg/dL — ABNORMAL HIGH (ref 6–23)
BUN: 59 mg/dL — ABNORMAL HIGH (ref 6–23)
BUN: 60 mg/dL — ABNORMAL HIGH (ref 6–23)
CO2: 19 mEq/L (ref 19–32)
CO2: 15 mEq/L — ABNORMAL LOW (ref 19–32)
Calcium: 10.8 mg/dL — ABNORMAL HIGH (ref 8.4–10.5)
Calcium: 10.5 mg/dL (ref 8.4–10.5)
Calcium: 10.3 mg/dL (ref 8.4–10.5)
Chloride: >130 mEq/L (ref 96–112)
Chloride: 126 mEq/L — ABNORMAL HIGH (ref 96–112)
Creatinine, Ser: 3.83 mg/dL — ABNORMAL HIGH (ref 0.50–1.10)
Creatinine, Ser: 3.97 mg/dL — ABNORMAL HIGH (ref 0.50–1.10)
Creatinine, Ser: 3.9 mg/dL — ABNORMAL HIGH (ref 0.50–1.10)
GFR calc Af Amer: 18 mL/min — ABNORMAL LOW (ref >90)
GFR calc Af Amer: 15 mL/min — ABNORMAL LOW (ref >90)
GFR calc non Af Amer: 13 mL/min — ABNORMAL LOW (ref >90)
GFR calc non Af Amer: 12 mL/min — ABNORMAL LOW (ref >90)
GFR calc non Af Amer: 13 mL/min — ABNORMAL LOW (ref >90)
Glucose, Bld: 521 mg/dL — ABNORMAL HIGH (ref 70–99)
Glucose, Bld: 502 mg/dL — ABNORMAL HIGH (ref 70–99)
Glucose, Bld: 403 mg/dL — ABNORMAL HIGH (ref 70–99)
Potassium: 4.1 mEq/L (ref 3.5–5.1)
Potassium: 4.4 mEq/L (ref 3.5–5.1)
Potassium: 4.3 mEq/L (ref 3.5–5.1)
Sodium: 157 mEq/L — ABNORMAL HIGH (ref 135–145)
Sodium: 152 mEq/L — ABNORMAL HIGH (ref 135–145)
Sodium: 150 mEq/L — ABNORMAL HIGH (ref 135–145)

## 2011-11-04 LAB — COMPREHENSIVE METABOLIC PANEL
AST: 37 U/L (ref 0–37)
Albumin: 3.8 g/dL (ref 3.5–5.2)
Calcium: 11.8 mg/dL — ABNORMAL HIGH (ref 8.4–10.5)
Creatinine, Ser: 3.12 mg/dL — ABNORMAL HIGH (ref 0.50–1.10)
GFR calc non Af Amer: 17 mL/min — ABNORMAL LOW (ref >90)

## 2011-11-04 LAB — GLUCOSE, CAPILLARY
Glucose-Capillary: 421 mg/dL — ABNORMAL HIGH (ref 70–99)
Glucose-Capillary: 309 mg/dL — ABNORMAL HIGH (ref 70–99)
Glucose-Capillary: 153 mg/dL — ABNORMAL HIGH (ref 70–99)
Glucose-Capillary: 128 mg/dL — ABNORMAL HIGH (ref 70–99)
Glucose-Capillary: 462 mg/dL — ABNORMAL HIGH (ref 70–99)
Glucose-Capillary: 419 mg/dL — ABNORMAL HIGH (ref 70–99)
Glucose-Capillary: 362 mg/dL — ABNORMAL HIGH (ref 70–99)

## 2011-11-04 LAB — CBC
MCH: 29.3 pg (ref 26.0–34.0)
MCV: 80.9 fL (ref 78.0–100.0)
Platelets: 197 10*3/uL (ref 150–400)
RDW: 13.9 % (ref 11.5–15.5)

## 2011-11-04 LAB — MAGNESIUM: Magnesium: 2 mg/dL (ref 1.5–2.5)

## 2011-11-04 LAB — TSH: TSH: 0.199 u[IU]/mL — ABNORMAL LOW (ref 0.350–4.500)

## 2011-11-04 MED ORDER — SODIUM CHLORIDE 0.9 % IV SOLN: INTRAVENOUS | Status: DC

## 2011-11-04 MED ORDER — INSULIN ASPART 100 UNIT/ML ~~LOC~~ SOLN
30.0000 [IU] | Freq: Once | SUBCUTANEOUS | Status: AC
  Administered 2011-11-04: 30 [IU] via SUBCUTANEOUS

## 2011-11-04 MED ORDER — LABETALOL HCL 200 MG PO TABS
200.0000 mg | ORAL_TABLET | Freq: Three times a day (TID) | ORAL | Status: DC
  Administered 2011-11-04: 200 mg via ORAL
  Filled 2011-11-04 (×3): qty 1

## 2011-11-04 MED ORDER — INSULIN GLARGINE 100 UNIT/ML ~~LOC~~ SOLN
10.0000 [IU] | SUBCUTANEOUS | Status: AC
  Administered 2011-11-04: 10 [IU] via SUBCUTANEOUS

## 2011-11-04 MED ORDER — HEPARIN SODIUM (PORCINE) 5000 UNIT/ML IJ SOLN
5000.0000 [IU] | Freq: Three times a day (TID) | INTRAMUSCULAR | Status: DC
  Administered 2011-11-04 – 2011-11-08 (×11): 5000 [IU] via SUBCUTANEOUS
  Filled 2011-11-04 (×14): qty 1

## 2011-11-04 MED ORDER — INSULIN GLARGINE 100 UNIT/ML ~~LOC~~ SOLN: 20.0000 [IU] | Freq: Every day | SUBCUTANEOUS | Status: DC

## 2011-11-04 MED ORDER — CLONIDINE HCL 0.1 MG PO TABS
0.1000 mg | ORAL_TABLET | Freq: Three times a day (TID) | ORAL | Status: DC
  Administered 2011-11-04 – 2011-11-08 (×12): 0.1 mg via ORAL
  Filled 2011-11-04 (×13): qty 1

## 2011-11-04 MED ORDER — DEXTROSE-NACL 5-0.45 % IV SOLN
INTRAVENOUS | Status: DC
  Administered 2011-11-05: 125 mL/h via INTRAVENOUS

## 2011-11-04 MED ORDER — PNEUMOCOCCAL VAC POLYVALENT 25 MCG/0.5ML IJ INJ
0.5000 mL | INJECTION | INTRAMUSCULAR | Status: AC
  Filled 2011-11-04: qty 0.5

## 2011-11-04 MED ORDER — SODIUM CHLORIDE 0.9 % IV SOLN
INTRAVENOUS | Status: DC
  Administered 2011-11-04: 4 [IU]/h via INTRAVENOUS
  Administered 2011-11-05: 04:00:00 via INTRAVENOUS
  Filled 2011-11-04 (×2): qty 1

## 2011-11-04 MED ORDER — INSULIN ASPART 100 UNIT/ML ~~LOC~~ SOLN: 3.0000 [IU] | Freq: Three times a day (TID) | SUBCUTANEOUS | Status: DC

## 2011-11-04 MED ORDER — MYCOPHENOLATE SODIUM 180 MG PO TBEC
540.0000 mg | DELAYED_RELEASE_TABLET | ORAL | Status: AC
  Administered 2011-11-04: 540 mg via ORAL
  Filled 2011-11-04: qty 3

## 2011-11-04 MED ORDER — DEXTROSE 50 % IV SOLN: 25.0000 mL | INTRAVENOUS | Status: DC | PRN

## 2011-11-04 MED ORDER — SODIUM CHLORIDE 0.45 % IV SOLN: INTRAVENOUS | Status: DC

## 2011-11-04 MED ORDER — INSULIN GLARGINE 100 UNIT/ML ~~LOC~~ SOLN: 10.0000 [IU] | Freq: Every day | SUBCUTANEOUS | Status: DC

## 2011-11-04 MED ORDER — LIVING WELL WITH DIABETES BOOK
Freq: Once | Status: AC
  Administered 2011-11-04: 14:00:00
  Filled 2011-11-04: qty 1

## 2011-11-04 MED ORDER — PREDNISONE 5 MG PO TABS
5.0000 mg | ORAL_TABLET | Freq: Every day | ORAL | Status: DC
  Administered 2011-11-04 – 2011-11-08 (×5): 5 mg via ORAL
  Filled 2011-11-04 (×5): qty 1

## 2011-11-04 MED ORDER — INSULIN ASPART 100 UNIT/ML ~~LOC~~ SOLN: 0.0000 [IU] | Freq: Every day | SUBCUTANEOUS | Status: DC

## 2011-11-04 MED ORDER — INSULIN ASPART 100 UNIT/ML ~~LOC~~ SOLN
0.0000 [IU] | Freq: Three times a day (TID) | SUBCUTANEOUS | Status: DC
  Administered 2011-11-04: 4 [IU] via SUBCUTANEOUS

## 2011-11-04 MED ORDER — MYCOPHENOLATE SODIUM 180 MG PO TBEC
720.0000 mg | DELAYED_RELEASE_TABLET | Freq: Two times a day (BID) | ORAL | Status: DC
  Administered 2011-11-04 – 2011-11-08 (×8): 720 mg via ORAL
  Filled 2011-11-04 (×9): qty 4

## 2011-11-04 MED ORDER — TACROLIMUS 1 MG PO CAPS
3.0000 mg | ORAL_CAPSULE | Freq: Two times a day (BID) | ORAL | Status: DC
  Administered 2011-11-04 – 2011-11-06 (×5): 3 mg via ORAL
  Filled 2011-11-04 (×9): qty 3

## 2011-11-04 MED ORDER — CLONIDINE HCL 0.2 MG PO TABS
0.2000 mg | ORAL_TABLET | Freq: Three times a day (TID) | ORAL | Status: DC
  Administered 2011-11-04: 0.2 mg via ORAL
  Filled 2011-11-04 (×3): qty 1

## 2011-11-04 MED ORDER — INSULIN ASPART 100 UNIT/ML ~~LOC~~ SOLN: 20.0000 [IU] | Freq: Once | SUBCUTANEOUS | Status: DC

## 2011-11-04 MED ORDER — LABETALOL HCL 200 MG PO TABS
200.0000 mg | ORAL_TABLET | Freq: Two times a day (BID) | ORAL | Status: DC
  Administered 2011-11-05 – 2011-11-08 (×6): 200 mg via ORAL
  Filled 2011-11-04 (×8): qty 1

## 2011-11-04 MED ORDER — K PHOS MONO-SOD PHOS DI & MONO 155-852-130 MG PO TABS
500.0000 mg | ORAL_TABLET | Freq: Three times a day (TID) | ORAL | Status: DC
  Administered 2011-11-04 – 2011-11-05 (×5): 500 mg via ORAL
  Filled 2011-11-04 (×8): qty 2

## 2011-11-04 MED ORDER — PANTOPRAZOLE SODIUM 40 MG PO TBEC
40.0000 mg | DELAYED_RELEASE_TABLET | Freq: Every day | ORAL | Status: DC
  Administered 2011-11-04 – 2011-11-08 (×5): 40 mg via ORAL
  Filled 2011-11-04 (×5): qty 1

## 2011-11-04 MED ORDER — SODIUM CHLORIDE 0.45 % IV SOLN
INTRAVENOUS | Status: DC
  Administered 2011-11-04 (×2): via INTRAVENOUS

## 2011-11-04 NOTE — Progress Notes (Signed)
CRITICAL VALUE ALERT  Critical value received:  Sodium 161 and Chloride > 130  Date of notification:  11/04/11  Time of notification:  0625  Critical value read back:yes  Nurse who received alert:  Patricia Pesa, RN  MD notified (1st page):  Walden Field, NP  Time of first page:  [phone]  MD notified (2nd page):  Time of second page:  Responding MD:  Walden Field, NP  Time MD responded:  [phone]

## 2011-11-04 NOTE — Progress Notes (Signed)
[facility]  TEAM 1 - Stepdown/ICU TEAM  Subjective: Admitted with acute onset altered mentation and new diagnosis of diabetes mellitus. After rehydration and correction of severe hyperglycemia mental status has nearly returned to baseline and no focal neurological deficits. Patient today only complaining of blurred vision. She endorses she has no real recollection of the events of yesterday and apparently of the days leading up to her presentation to the ER.  Objective: Blood pressure 104/82, pulse 100, temperature 98.4 F (36.9 C), temperature source Oral, resp. rate 20, height 6' (1.829 m), weight 136.36 kg (300 lb 9.9 oz), last menstrual period 10/08/2011, SpO2 99.00%.  Intake/Output from previous day: 04/28 0701 - 04/29 0700 In: 6682.1 [I.V.:5780.1; IV [id number] Out: [id number] [Urine:1675] Intake/Output this shift: Total I/O In: 1152.8 [P.O.:600; I.V.:552.8] Out: -   General appearance: alert, cooperative, appears stated age, fatigued and no distress Resp: clear to auscultation bilaterally Cardio: regular tachycardic rate and sinus rhythm, S1, S2 normal, no murmur, click, rub or gallop GI: soft, non-tender; bowel sounds normal; no masses,  no organomegaly GU: Foley catheter in place draining clear yellow urine to bedside bag Extremities: extremities normal, atraumatic, no cyanosis or edema Neurologic: Grossly normal although slightly lethargic, exam is nonfocal  Lab Results:  Basename 11/04/11 0525 11/03/11 1750  WBC 9.0 8.0  HGB 15.6* 17.2*  HCT 43.1 RESULTS UNAVAILABLE DUE TO INTERFERING SUBSTANCE  PLT 197 215   BMET  Basename 11/04/11 0857 11/04/11 0525  NA 162* 161*  K 4.1 4.1  CL >130* >130*  CO2 19 20  GLUCOSE 156* 160*  BUN 49* 50*  CREATININE 3.27* 3.12*  CALCIUM 11.9* 11.8*   Medications: I have reviewed the patient's current medications.  Assessment/Plan:  DKA (diabetic ketoacidoses)/ Diabetes mellitus, new onset *CBGs consistently  below 200 now *We'll transition from insulin infusion to low dose subcutaneous Lantus *Begin sliding scale insulin *Once status is more stable will need diabetes educator consult and instruction regarding management of diabetes   Acute hypernatremia/ Fluid volume depletion *Presented with normal sodium in the setting of serum glucose greater than 1000- likely falsely normal sodium and setting the patient was profoundly dehydrated. *Since admission and despite aggressive rehydration sodium has increased to the 160 range *Suspect acute renal failure also influencing sodium level *Remains quite volume depleted as evidenced not only by abnormal electrolyte panel but by hemoconcentration as reflected by polycythemia and new hypercalcemia *We'll continue IV fluid half normal saline but decreased 250 cc per hour since patient's creatinine clearance is rather low and although we do not suspect this is accurate the CVP is elevated * we are repeating another electrolyte panel at 1 PM and again in the morning   Acute-on-chronic renal failure/history of renal transplant 1.5 years ago *Patient reports baseline creatinine around 1.5 *Have asked nephrology to consult in this patient given her renal transplant history and acute renal failure *We will continue her home anti-rejection medications + transition the IV Solu-Cortef to oral prednisone *Pharmacist is helping [place] to clarify all of her antirejection medication - they have clarified that the patient has not taken Valcyte for some time.  SIRS (systemic inflammatory response syndrome) *At this point no obvious source of any infection and suspect acidosis and elevated Procalcitonin due to low perfusion from persistent dehydration and acute renal failure *We will go ahead and discontinue all empiric antibiotic coverage: Vancomycin, Maxipime, and Levaquin.  Metabolic acidosis: due to DKA and ARF *Anion gap has now closed *Begin diet and adjustment in  insulin as outlined above  Altered mental state/toxic metabolic encephalopathy *Symptomatology has improved after treatment of the above stated problems *CT scan of the head without contrast demonstrated low density in the central pons and central pontomesencephalic region. The radiologist noticed this could be artifactual in nature and is otherwise clinically indicated may obtain MRI/MRA of the brain. Patient's physical exam from a neurological standpoint is nonfocal at this time - no clinical evidence to suggest acute ischemic neurologic event   HTN (hypertension) *Blood pressure well controlled at this time *Continue clonidine and labetalol *Consider resuming Norvasc tomorrow if blood pressure remains elevated   Disposition *Remain in stepdown   LOS: 1 day   Erin Hearing, ANP pager [phone]  [facility] [email or web address] Password: [email or web address]  11/04/2011, 12:09 PM  I have personally examined this patient and reviewed the entire database. I have reviewed the above note, made any necessary editorial changes, and agree with its content.  Cherene Altes, MD [facility]

## 2011-11-04 NOTE — Progress Notes (Signed)
Inpatient Diabetes Program Recommendations  AACE/ADA: New Consensus Statement on Inpatient Glycemic Control (2009)  Target Ranges:  Prepandial:   less than 140 mg/dL      Peak postprandial:   less than 180 mg/dL (1-2 hours)      Critically ill patients:  140 - 180 mg/dL   Inpatient Diabetes Program Recommendations Insulin - Basal: Increase Lantus to 25 units   Note: Drip rate on GlucoStabilizer was still 12.1 units per hour at discontinuation of gtt.  Will likely need an increased dose of basal insulin Lantus.  Will continue to follow during this admission.    Thank you  Raoul Pitch [facility] Inpatient Diabetes Coordinator [phone]

## 2011-11-04 NOTE — Consult Note (Signed)
[name] is an 46 y.o. 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  No history of DM.  Last glucose in march was 108 but on admission BS >1400.  Denies feeling poorly in the prior weeks though family disagrees.  Does admit to thirst and increased urination along with some orthostasis.  Baseline Scr 1.5 - 2 range.  On admission Scr 2.96 and today 3.2.  UO good.  Past Medical History  Diagnosis Date  . Renal disorder   . Hypertension     Past Surgical History  Procedure Date  . Kidney transplant   . Dg av dialysis  shunt access exist*l* or   . Cholecystectomy     History reviewed. No pertinent family history. Social History:  reports that she has been smoking.  She does not have any smokeless tobacco history on file. She reports that she does not drink alcohol. Her drug history not on file. 1 ppd smoker for 25 yrs  FH:   Mother with HTN and DM Aunt with ESRD due to DM  Allergies:  Allergies  Allergen Reactions  . Ancef (Cefazolin Sodium) Other (See Comments)    Tolerated rocephin 11/03/10  . Other     Pt may be allergic to another antibiotic daughter isn't sure and pt unable to verify.    Medications Prior to Admission  Medication Sig Dispense Refill  . amLODipine (NORVASC) 10 MG tablet Take 10 mg by mouth daily.      . cloNIDine (CATAPRES) 0.2 MG tablet Take 0.2 mg by mouth 3 (three) times daily.      [name] labetalol (NORMODYNE) 200 MG tablet Take 200 mg by mouth 3 (three) times daily.      . mycophenolate (MYFORTIC) 180 MG EC tablet Take by mouth 2 (two) times daily.      [name] nystatin (MYCOSTATIN) 100000 UNIT/ML suspension Take 500,000 Units by mouth 3 (three) times daily. Swish and swallow      . omeprazole (PRILOSEC) 20 MG capsule Take 20 mg by mouth daily.      . predniSONE (DELTASONE) 5 MG tablet Take 5 mg by mouth daily.      [name]  sulfamethoxazole-trimethoprim (BACTRIM,SEPTRA) 400-80 MG per tablet Take 1 tablet by mouth 3 (three) times a week. Monday Wednesday and friday      . tacrolimus (PROGRAF) 1 MG capsule Take 3 mg by mouth 2 (two) times daily.      . tacrolimus (PROGRAF) 5 MG capsule Take 5 mg by mouth 2 (two) times daily.      . valGANciclovir (VALCYTE) 450 MG tablet Take 450 mg by mouth 3 (three) times a week. Monday Wednesday and Friday         Lab Results: UA: + glucose benign micro  Basename 11/04/11 0525 11/03/11 1750 11/03/11 1237 11/03/11 1217  WBC 9.0 8.0 -- 9.4  HGB 15.6* 17.2* 17.7* --  HCT 43.1 RESULTS UNAVAILABLE DUE TO INTERFERING SUBSTANCE 52.0* --  PLT 197 215 -- 202   BMET  Basename 11/04/11 0857 11/04/11 0525 11/03/11 2228  NA 162* 161* 152*  K 4.1 4.1 3.0*  CL >130* >130* 119*  CO2 19 20 19   GLUCOSE 156* 160* 618*  BUN 49* 50* 50*  CREATININE 3.27* 3.12* 3.01*  CALCIUM 11.9* 11.8* 11.2*  PHOS -- 1.3* --   LFT  Basename 11/04/11  0525  PROT 7.3  ALBUMIN 3.8  AST 37  ALT 17  ALKPHOS 188*  BILITOT 0.4  BILIDIR --  IBILI --   Ct Head Wo Contrast  11/03/2011  *RADIOLOGY REPORT*  Clinical Data: Altered mental status.  Weakness.  CT HEAD WITHOUT CONTRAST  Technique:  Contiguous axial images were obtained from the base of the skull through the vertex without contrast.  Comparison: 02/22/2007.  Findings: The subarachnoid spaces remain minimally prominent. Normal size and position of the ventricles.  Low density in the central pons, in a region of streak artifacts.  There is also some low density in the central pontomesencephalic junction region. There are less streak artifacts in that region.  No intracranial hemorrhage or mass lesion seen.  Unremarkable bones and paranasal sinuses.  IMPRESSION: Low density in the central pons and central pontomesencephalic region.  This could be artifactual in nature or represent subacute ischemic changes.  If clinically indicated, pre and  postcontrast magnetic resonance imaging of the brain and MRA may be helpful.  Original Report Authenticated By: Gerald Stabs, M.D.   Dg Chest Port 1 View  11/03/2011  *RADIOLOGY REPORT*  Clinical Data: Left subclavian central line placement.  PORTABLE CHEST - 1 VIEW  Comparison: 11/03/2011.  Findings: 2052 hours.  Left subclavian central venous catheter tip is within the mid SVC near the azygos vein.  The heart size and mediastinal contours are stable. There is no pneumothorax.  There are persistent low lung volumes with patchy bibasilar air space opacities.  IMPRESSION:  1.  Central line placement without demonstrated complication. 2.  Unchanged bibasilar air space opacities.  Original Report Authenticated By: Vivia Ewing, M.D.   Dg Chest Portable 1 View  11/03/2011  *RADIOLOGY REPORT*  Clinical Data: Altered mental status, lethargy and weakness. Hyperglycemia.  PORTABLE CHEST - 1 VIEW  Comparison: 05/22/2009  Findings: Lung volumes are very low with bibasilar atelectasis present.  It would be difficult to exclude subtle bibasilar infiltrates.  No gross edema or pleural fluid.  Stable cardiomegaly.  IMPRESSION: Bibasilar atelectasis versus infiltrates.  No edema.  Original Report Authenticated By: Azzie Roup, M.D.    ROS: Appetite has been good No SOB No CP No abd pain or change in BM No edema CO blurry vision No neuropathic SXs No dysuria  PHYSICAL EXAM: Blood pressure 139/92, pulse 116, temperature 98.6 F (37 C), temperature source Oral, resp. rate 21, height 6' (1.829 m), weight 136.36 kg (300 lb 9.9 oz), last menstrual period 10/08/2011, SpO2 100.00%. HEENT: [place], EOMI NECK:No JVD  Lt IJ triple lumen in place LUNGS:decreased BS bases CARDIAC:RRR wo MRG ABD:+ BS Mild diffuse tenderness. No rebound or gaurding.  Tx Rt lower quadrant, nontender EXT:no edema NEURO:CN intact.  Ox3 no asterixis  Assessment: 1. Acute renal failure in transplant pt most likely sec volume  depletion in setting of DKA 2. DKA, improving 3. Hypophosphatemia 4. Hypercalcemia 5. HTN 6. Cad renal TX PLAN: 1. Cont IV hydration 2. Check trough prograf level 3. Change myfortic to 4 pills BID 4. Replace PO4 orally with neutraphos 5. Follow SCa for now, anticipate it to improve with hydration.  Had Nl Ca in March   Justen Fonda T 11/04/2011, 12:06 PM

## 2011-11-04 NOTE — Progress Notes (Signed)
CRITICAL VALUE ALERT  Critical value received:  Na- 162 and Chloride >130  Date of notification:  11-04-11 Time of notification:  0938  Critical value read back:yes  Nurse who received alert:  Hilda Blades  MD notified (1st page):  Lissa Merlin, NP  Time of first page:  0945  MD notified (2nd page):  Time of second page:  Responding MD:  Lissa Merlin  Time MD responded:  [phone]

## 2011-11-04 NOTE — Progress Notes (Signed)
Utilization review completed.  

## 2011-11-05 LAB — CBC
HCT: 37.7 % (ref 36.0–46.0)
MCH: 28.7 pg (ref 26.0–34.0)
MCV: 81.3 fL (ref 78.0–100.0)
Platelets: 142 10*3/uL — ABNORMAL LOW (ref 150–400)
RDW: 14.2 % (ref 11.5–15.5)

## 2011-11-05 LAB — BASIC METABOLIC PANEL
BUN: 60 mg/dL — ABNORMAL HIGH (ref 6–23)
Calcium: 10.3 mg/dL (ref 8.4–10.5)
Calcium: 10.3 mg/dL (ref 8.4–10.5)
Creatinine, Ser: 3.74 mg/dL — ABNORMAL HIGH (ref 0.50–1.10)
Creatinine, Ser: 3.88 mg/dL — ABNORMAL HIGH (ref 0.50–1.10)
GFR calc Af Amer: 15 mL/min — ABNORMAL LOW (ref >90)
GFR calc non Af Amer: 13 mL/min — ABNORMAL LOW (ref >90)
Sodium: 156 mEq/L — ABNORMAL HIGH (ref 135–145)

## 2011-11-05 LAB — GLUCOSE, CAPILLARY
Glucose-Capillary: 274 mg/dL — ABNORMAL HIGH (ref 70–99)
Glucose-Capillary: 221 mg/dL — ABNORMAL HIGH (ref 70–99)
Glucose-Capillary: 194 mg/dL — ABNORMAL HIGH (ref 70–99)
Glucose-Capillary: 164 mg/dL — ABNORMAL HIGH (ref 70–99)
Glucose-Capillary: 170 mg/dL — ABNORMAL HIGH (ref 70–99)
Glucose-Capillary: 132 mg/dL — ABNORMAL HIGH (ref 70–99)
Glucose-Capillary: 218 mg/dL — ABNORMAL HIGH (ref 70–99)
Glucose-Capillary: 360 mg/dL — ABNORMAL HIGH (ref 70–99)

## 2011-11-05 LAB — RENAL FUNCTION PANEL
Albumin: 3.2 g/dL — ABNORMAL LOW (ref 3.5–5.2)
BUN: 59 mg/dL — ABNORMAL HIGH (ref 6–23)
Calcium: 10.3 mg/dL (ref 8.4–10.5)
Creatinine, Ser: 3.79 mg/dL — ABNORMAL HIGH (ref 0.50–1.10)
Glucose, Bld: 198 mg/dL — ABNORMAL HIGH (ref 70–99)
Phosphorus: 3.6 mg/dL (ref 2.3–4.6)

## 2011-11-05 MED ORDER — INSULIN GLARGINE 100 UNIT/ML ~~LOC~~ SOLN
20.0000 [IU] | Freq: Every day | SUBCUTANEOUS | Status: DC
  Administered 2011-11-05: 20 [IU] via SUBCUTANEOUS

## 2011-11-05 MED ORDER — INSULIN ASPART 100 UNIT/ML ~~LOC~~ SOLN
0.0000 [IU] | Freq: Three times a day (TID) | SUBCUTANEOUS | Status: DC
  Administered 2011-11-05: 20 [IU] via SUBCUTANEOUS
  Administered 2011-11-05: 7 [IU] via SUBCUTANEOUS
  Administered 2011-11-06: 17:00:00 via SUBCUTANEOUS
  Administered 2011-11-06: 20 [IU] via SUBCUTANEOUS
  Administered 2011-11-06: 15 [IU] via SUBCUTANEOUS
  Administered 2011-11-07 (×3): 11 [IU] via SUBCUTANEOUS
  Administered 2011-11-08: 7 [IU] via SUBCUTANEOUS
  Administered 2011-11-08: 11 [IU] via SUBCUTANEOUS

## 2011-11-05 MED ORDER — INSULIN ASPART 100 UNIT/ML ~~LOC~~ SOLN
4.0000 [IU] | Freq: Three times a day (TID) | SUBCUTANEOUS | Status: DC
  Administered 2011-11-05: 4 [IU] via SUBCUTANEOUS

## 2011-11-05 MED ORDER — SODIUM CHLORIDE 0.45 % IV SOLN
INTRAVENOUS | Status: DC
  Administered 2011-11-05: 125 mL/h via INTRAVENOUS
  Administered 2011-11-06 – 2011-11-07 (×2): via INTRAVENOUS

## 2011-11-05 MED ORDER — INSULIN ASPART 100 UNIT/ML ~~LOC~~ SOLN
0.0000 [IU] | Freq: Every day | SUBCUTANEOUS | Status: DC
  Administered 2011-11-05: 4 [IU] via SUBCUTANEOUS
  Administered 2011-11-06: 5 [IU] via SUBCUTANEOUS
  Administered 2011-11-07: 2 [IU] via SUBCUTANEOUS

## 2011-11-05 MED ORDER — INSULIN ASPART 100 UNIT/ML ~~LOC~~ SOLN
6.0000 [IU] | Freq: Three times a day (TID) | SUBCUTANEOUS | Status: DC
  Administered 2011-11-05 – 2011-11-06 (×2): 6 [IU] via SUBCUTANEOUS

## 2011-11-05 NOTE — Progress Notes (Signed)
[facility] [place] TEAM 1 - Stepdown/ICU TEAM  Interim history: Admitted with acute onset altered mentation and new diagnosis of diabetes mellitus. After rehydration and correction of severe hyperglycemia mental status has nearly returned to baseline and no focal neurological deficits.   Subjective: Today more alert. Denies chest pain but endorses fatigue and weakness. States has not been out of bed yet. Discussed new diagnosis of diabetes.  Objective: Blood pressure 116/85, pulse 93, temperature 97.8 F (36.6 C), temperature source Oral, resp. rate 19, height 6' (1.829 m), weight 136.36 kg (300 lb 9.9 oz), last menstrual period 10/08/2011, SpO2 99.00%.  Intake/Output from previous day: 04/29 0701 - 04/30 0700 In: 4537.9 [P.O.:840; I.V.:3697.9] Out: 485 [Urine:485] Intake/Output this shift: Total I/O In: 1359.5 [P.O.:720; I.V.:639.5] Out: 135 [Urine:135]  General appearance: alert, cooperative, appears stated age, fatigued and no distress Resp: clear to auscultation bilaterally Cardio: regular tachycardic rate and sinus rhythm, S1, S2 normal, no murmur, click, rub or gallop, IV fluids infusing at 125 cc per hour GI: soft, non-tender; bowel sounds normal; no masses,  no organomegaly GU: Foley catheter in place draining clear yellow urine to bedside bag Extremities: extremities normal, atraumatic, no cyanosis or edema Neurologic: Grossly normal and less lethargic today, exam is nonfocal  Lab Results:  Basename 11/05/11 0230 11/04/11 0525  WBC 5.8 9.0  HGB 13.3 15.6*  HCT 37.7 43.1  PLT 142* 197   BMET  Basename 11/05/11 0300 11/05/11 0230  NA 155* 156*  K 4.1 3.9  CL 125* 125*  CO2 17* 17*  GLUCOSE 198* 202*  BUN 59* 60*  CREATININE 3.79* 3.74*  CALCIUM 10.3 10.3   Medications: I have reviewed the patient's current medications.  Assessment/Plan:  DKA (diabetic ketoacidoses)/ Diabetes mellitus, new onset *CBGs increase yesterday evening up to 500 after  discontinuation of IV insulin infusion and initiation of low-dose Lantus therefore IV insulin was resumed.  *We'll transition from insulin infusion to higher dose long-acting insulin. We'll begin with Lantus 20 units subcutaneous twice a day. Diabetes coordinator that reviewed-calculated Lantus dose based on patient weight 54 units subcutaneous daily *Begin sliding scale insulin resistance scale with meals coverage *Once status is more stable will need diabetes educator consult and instruction regarding management of diabetes   Acute hypernatremia/ Fluid volume depletion *Presented with normal sodium in the setting of serum glucose greater than 1000- likely falsely normal sodium and setting the patient was profoundly dehydrated. *Since admission and despite aggressive rehydration sodium did increase to the 160 range and with further rehydration now has decreased to the 150 range *Suspect acute renal failure also influencing sodium level *Remains quite volume depleted as evidenced not only by abnormal electrolyte panel but by hemoconcentration as reflected by polycythemia and new hypercalcemia-hemoglobin has now decreased to 13 and calcium has decreased down to 10 *Change IV fluid to D5 one half normal saline to half normal saline at 125 cc per hour  Acute-on-chronic renal failure/history of renal transplant 1.5 years ago *Patient reports baseline creatinine around 1.5 *Appreciate nephrology assistance *We will continue her home anti-rejection medications + transition the IV Solu-Cortef to oral prednisone *Pharmacist is helping [place] to clarify all of her antirejection medication - they have clarified that the patient has not taken Valcyte for some time.  SIRS (systemic inflammatory response syndrome) *At this point no obvious source of any infection and suspect acidosis and elevated Procalcitonin due to low perfusion from persistent dehydration and acute renal failure *Empiric antibiotic coverage of  Vancomycin, Maxipime, and  Levaquin were discontinued 11/04/2011.  Metabolic acidosis: due to DKA and ARF *Anion gap has now closed *Begin diet and adjustment in insulin as outlined above  Altered mental state/toxic metabolic encephalopathy *Symptomatology has improved after treatment of the above stated problems *CT scan of the head without contrast demonstrated low density in the central pons and central pontomesencephalic region. The radiologist noticed this could be artifactual in nature and is otherwise clinically indicated may obtain MRI/MRA of the brain. Patient's physical exam from a neurological standpoint is nonfocal at this time - no clinical evidence to suggest acute ischemic neurologic event   HTN (hypertension) *Blood pressure well controlled at this time *Continue clonidine and labetalol *Consider resuming Norvasc tomorrow if blood pressure remains elevated   Disposition *Remain in stepdown   LOS: 2 days   Erin Hearing, ANP pager [phone]  [facility] [email or web address] Password: [email or web address]  11/05/2011, 2:01 PM   I have examined the patient and reviewed the chart. I have increased the mealtime novolog due to uncontrolled sugars. I agree with the above note.   Debbe Odea, MD [phone]

## 2011-11-05 NOTE — Progress Notes (Addendum)
Patient admitted with DKA.  Has newly diagnosed diabetes.  A1C 10% (11/03/11).  Patient was transitioned off the IV insulin drip/GlucoStabilizer yesterday morning.  Patient was only given 10 units Lantus and her CBGs by 4pm yesterday reached 480 mg/dl.  Insulin drip was restarted at 5pm last night. Insulin drip rates have been averaging around 10 units per hour since midnight.  Last BMET showed CO2 of 17 @ 0300am.  Not sure if patient ready to transition off insulin drip yet since her CO2 is still below normal.  When patient is ready to transition off IV insulin drip, please give Lantus 1 hour before insulin drip stopped.  Since patient did not do well with 10 units Lantus, recommend we give her a larger dose at transition.  Based on her weight of 136 kg, could start with 0.4 units/kg= 54 units Lantus.  Will follow. Wyn Quaker RN, MSN, CDE Diabetes Coordinator Inpatient Diabetes Program [phone]

## 2011-11-05 NOTE — Progress Notes (Signed)
S: Feels better.  No abd pain.. No nausea.  Wants to eat O:BP 127/81  Pulse 116  Temp(Src) 98.2 F (36.8 C) (Oral)  Resp 21  Ht 6' (1.829 m)  Wt 136.36 kg (300 lb 9.9 oz)  BMI 40.77 kg/m2  SpO2 97%  LMP 10/08/2011  Intake/Output Summary (Last 24 hours) at 11/05/11 0834 Last data filed at 11/05/11 0700  Gross per 24 hour  Intake 4311.65 ml  Output    485 ml  Net 3826.65 ml   Weight change:  [id number] and alert [id number] Reg Resp:clear Abd:+ BS NTND Ext:no edema NEURO:Ox3 CNI      . cloNIDine  0.1 mg Oral TID  . heparin  5,000 Units Subcutaneous Q8H  . insulin aspart  30 Units Subcutaneous Once  . insulin glargine  10 Units Subcutaneous NOW  . labetalol  200 mg Oral BID  . lidocaine  5 mL Infiltration Once  . living well with diabetes book   Does not apply Once  . mycophenolate  540 mg Oral NOW  . mycophenolate  720 mg Oral BID  . pantoprazole  40 mg Oral Daily  . phosphorus  500 mg Oral TID  . pneumococcal 23 valent vaccine  0.5 mL Intramuscular Tomorrow-1000  . predniSONE  5 mg Oral Daily  . tacrolimus  3 mg Oral BID  . tacrolimus  5 mg Oral BID  . DISCONTD: antiseptic oral rinse  15 mL Mouth Rinse q12n4p  . DISCONTD: aspirin  300 mg Rectal Daily  . DISCONTD: chlorhexidine  15 mL Mouth Rinse BID  . DISCONTD: cloNIDine  0.2 mg Oral TID  . DISCONTD: heparin  5,000 Units Subcutaneous Q8H  . DISCONTD: hydrocortisone sod succinate (SOLU-CORTEF) injection  25 mg Intravenous Daily  . DISCONTD: insulin aspart  0-20 Units Subcutaneous TID WC  . DISCONTD: insulin aspart  0-5 Units Subcutaneous QHS  . DISCONTD: insulin aspart  20 Units Subcutaneous Once  . DISCONTD: insulin aspart  3 Units Subcutaneous TID WC  . DISCONTD: insulin glargine  10 Units Subcutaneous QHS  . DISCONTD: insulin glargine  20 Units Subcutaneous QHS  . DISCONTD: labetalol  200 mg Oral TID  . DISCONTD: midazolam  4 mg Intravenous Once  . DISCONTD: mycophenolate  180 mg Oral BID  . DISCONTD:  pantoprazole (PROTONIX) IV  40 mg Intravenous QHS  . DISCONTD: tacrolimus  3 mg Oral Daily  . DISCONTD: valGANciclovir  450 mg Oral 3 times weekly   Ct Head Wo Contrast  11/03/2011  *RADIOLOGY REPORT*  Clinical Data: Altered mental status.  Weakness.  CT HEAD WITHOUT CONTRAST  Technique:  Contiguous axial images were obtained from the base of the skull through the vertex without contrast.  Comparison: 02/22/2007.  Findings: The subarachnoid spaces remain minimally prominent. Normal size and position of the ventricles.  Low density in the central pons, in a region of streak artifacts.  There is also some low density in the central pontomesencephalic junction region. There are less streak artifacts in that region.  No intracranial hemorrhage or mass lesion seen.  Unremarkable bones and paranasal sinuses.  IMPRESSION: Low density in the central pons and central pontomesencephalic region.  This could be artifactual in nature or represent subacute ischemic changes.  If clinically indicated, pre and postcontrast magnetic resonance imaging of the brain and MRA may be helpful.  Original Report Authenticated By: Gerald Stabs, M.D.   Dg Chest Port 1 View  11/03/2011  *RADIOLOGY REPORT*  Clinical Data: Left subclavian central line  placement.  PORTABLE CHEST - 1 VIEW  Comparison: 11/03/2011.  Findings: 2052 hours.  Left subclavian central venous catheter tip is within the mid SVC near the azygos vein.  The heart size and mediastinal contours are stable. There is no pneumothorax.  There are persistent low lung volumes with patchy bibasilar air space opacities.  IMPRESSION:  1.  Central line placement without demonstrated complication. 2.  Unchanged bibasilar air space opacities.  Original Report Authenticated By: Vivia Ewing, M.D.   Dg Chest Portable 1 View  11/03/2011  *RADIOLOGY REPORT*  Clinical Data: Altered mental status, lethargy and weakness. Hyperglycemia.  PORTABLE CHEST - 1 VIEW  Comparison:  05/22/2009  Findings: Lung volumes are very low with bibasilar atelectasis present.  It would be difficult to exclude subtle bibasilar infiltrates.  No gross edema or pleural fluid.  Stable cardiomegaly.  IMPRESSION: Bibasilar atelectasis versus infiltrates.  No edema.  Original Report Authenticated By: Azzie Roup, M.D.   BMET    Component Value Date/Time   NA 155* 11/05/2011 0300   K 4.1 11/05/2011 0300   CL 125* 11/05/2011 0300   CO2 17* 11/05/2011 0300   GLUCOSE 198* 11/05/2011 0300   BUN 59* 11/05/2011 0300   CREATININE 3.79* 11/05/2011 0300   CALCIUM 10.3 11/05/2011 0300   GFRNONAA 13* 11/05/2011 0300   GFRAA 15* 11/05/2011 0300   CBC    Component Value Date/Time   WBC 5.8 11/05/2011 0230   RBC 4.64 11/05/2011 0230   HGB 13.3 11/05/2011 0230   HCT 37.7 11/05/2011 0230   PLT 142* 11/05/2011 0230   MCV 81.3 11/05/2011 0230   MCH 28.7 11/05/2011 0230   MCHC 35.3 11/05/2011 0230   RDW 14.2 11/05/2011 0230   LYMPHSABS 0.5* 11/03/2011 1217   MONOABS 0.3 11/03/2011 1217   EOSABS 0.0 11/03/2011 1217   BASOSABS 0.0 11/03/2011 1217     Assessment: 1. Acute on CKD most likely sec to volume depletion 2. DKA 3. Hypophosphatemia, improved 4. Hypercalcemia, improved  Plan: 1. Cont IV fluids 2. I would allow her to eat or at least take PO fluids 3.  Awaiting prograf level 4.  Daily Scr   Shir Bergman T

## 2011-11-06 DIAGNOSIS — N179 Acute kidney failure, unspecified: Secondary | ICD-10-CM

## 2011-11-06 DIAGNOSIS — E1010 Type 1 diabetes mellitus with ketoacidosis without coma: Secondary | ICD-10-CM

## 2011-11-06 DIAGNOSIS — R4182 Altered mental status, unspecified: Secondary | ICD-10-CM

## 2011-11-06 LAB — RENAL FUNCTION PANEL
BUN: 56 mg/dL — ABNORMAL HIGH (ref 6–23)
CO2: 15 mEq/L — ABNORMAL LOW (ref 19–32)
Calcium: 8.2 mg/dL — ABNORMAL LOW (ref 8.4–10.5)
Chloride: 115 mEq/L — ABNORMAL HIGH (ref 96–112)
Creatinine, Ser: 2.68 mg/dL — ABNORMAL HIGH (ref 0.50–1.10)

## 2011-11-06 LAB — GLUCOSE, CAPILLARY
Glucose-Capillary: 303 mg/dL — ABNORMAL HIGH (ref 70–99)
Glucose-Capillary: 490 mg/dL — ABNORMAL HIGH (ref 70–99)
Glucose-Capillary: 397 mg/dL — ABNORMAL HIGH (ref 70–99)

## 2011-11-06 LAB — C-PEPTIDE: C-Peptide: 2 ng/mL (ref 0.80–3.90)

## 2011-11-06 LAB — TACROLIMUS LEVEL: Tacrolimus (FK506) - LabCorp: 7.3 ng/mL

## 2011-11-06 MED ORDER — INSULIN GLARGINE 100 UNIT/ML ~~LOC~~ SOLN
50.0000 [IU] | Freq: Every day | SUBCUTANEOUS | Status: DC
  Administered 2011-11-06: 50 [IU] via SUBCUTANEOUS

## 2011-11-06 MED ORDER — INSULIN GLARGINE 100 UNIT/ML ~~LOC~~ SOLN: 28.0000 [IU] | Freq: Every day | SUBCUTANEOUS | Status: DC

## 2011-11-06 MED ORDER — INSULIN PEN STARTER KIT
1.0000 | Freq: Once | Status: AC
  Administered 2011-11-06: 1
  Filled 2011-11-06: qty 1

## 2011-11-06 MED ORDER — INSULIN ASPART 100 UNIT/ML ~~LOC~~ SOLN
8.0000 [IU] | Freq: Three times a day (TID) | SUBCUTANEOUS | Status: DC
  Administered 2011-11-06 – 2011-11-07 (×4): 8 [IU] via SUBCUTANEOUS

## 2011-11-06 NOTE — Progress Notes (Signed)
The start kit and video guide was given to pt.pt. Verbalized that she still unable to see well so she will wait until her kids come tomorrow to go over with them.keep monitoring pt. Closely and assessing her needs

## 2011-11-06 NOTE — Consult Note (Signed)
Pt smokes 1 ppd and has quit before for a year. She is in action stage and wants to quit.  States she needs help with medaids. Recommended 21 mg patch to start with and how to taper. Pt verbalizes understanding. Referred to 1-800 quit now for f/u and support. Discussed oral fixation substitutes, second hand smoke and in home smoking policy. Reviewed and gave pt Written education/contact information.

## 2011-11-06 NOTE — Progress Notes (Signed)
[facility] [place] TEAM 1 - Stepdown/ICU TEAM  Interim history: Admitted with acute onset altered mentation and new diagnosis of diabetes mellitus. After rehydration and correction of severe hyperglycemia mental status has nearly returned to baseline and no focal neurological deficits.   Subjective: Has returned to baseline level of neurological function. Still endorses bilateral blurry vision which is slowly improving. RN is ambulating the patient.  She denies cp, f/c, n/v, or abdom pain.    Objective: Blood pressure 139/88, pulse 93, temperature 98.5 F (36.9 C), temperature source Oral, resp. rate 16, height 6' (1.829 m), weight 136.36 kg (300 lb 9.9 oz), last menstrual period 10/08/2011, SpO2 98.00%.  Intake/Output from previous day: 04/30 0701 - 05/01 0700 In: 4334.5 [P.O.:1320; I.V.:3014.5] Out: 1515 [Urine:1515] Intake/Output this shift: Total I/O In: 785 [P.O.:360; I.V.:425] Out: 651 [Urine:650; Stool:1]  General appearance: alert, cooperative, appears stated age, no distress Resp: clear to auscultation bilaterally Cardio: regular tachycardic rate and sinus rhythm, S1, S2 normal, no murmur, click, rub or gallop GI: soft, non-tender; bowel sounds normal; no masses,  no organomegaly Extremities: extremities normal, atraumatic, no cyanosis or edema Neurologic: Grossly normal and less lethargic today, exam is nonfocal  Lab Results:  Basename 11/05/11 0230 11/04/11 0525  WBC 5.8 9.0  HGB 13.3 15.6*  HCT 37.7 43.1  PLT 142* 197   BMET  Basename 11/06/11 0430 11/05/11 0300  NA 144 155*  K 3.6 4.1  CL 115* 125*  CO2 15* 17*  GLUCOSE 309* 198*  BUN 56* 59*  CREATININE 2.68* 3.79*  CALCIUM 8.2* 10.3   Medications: I have reviewed the patient's current medications.  Assessment/Plan:  DKA (diabetic ketoacidoses)/ Diabetes mellitus, new onset *CBGs consistently greater than 200 and closer to 300. *Over the past 24 hours while insulin drip patient used a  total of 400 units *Increase Lantus to 50 units daily and monitor *Continue sliding scale insulin resistant scale and increase meal coverage dosage *Once status is more stable will need diabetes educator consult and instruction regarding management of diabetes  Acute hypernatremia/ Fluid volume depletion *Presented with normal sodium in the setting of serum glucose greater than 1000- likely falsely normal sodium and setting the patient was profoundly dehydrated. *Sodium has normalized but still remains dry *Continue IV fluids but decrease to 100 cc per hour-hopefully can discontinue IV fluids in the next 24-48 hour  Acute-on-chronic renal failure/history of renal transplant 1.5 years ago *Patient reports baseline creatinine around 1.5- peak creatinine this admission 3.97 and has trended downward to 2.68 *Appreciate nephrology assistance *Continue antirejection medications including her chronic prednisone *Pharmacist is helping [place] to clarify all of her antirejection medication - they have clarified that the patient has not taken Valcyte for some time.  SIRS (systemic inflammatory response syndrome) *At this point no obvious source of any infection and suspect acidosis and elevated Procalcitonin due to low perfusion from persistent dehydration and acute renal failure *Empiric antibiotic coverage of Vancomycin, Maxipime, and Levaquin were discontinued 11/04/2011.  Metabolic acidosis: due to DKA and ARF *DKA has now resolved - persistent metabolic acidosis is now most c/w her acute renal insuff  Altered mental state/toxic metabolic encephalopathy *Symptomatology has improved after treatment of the above stated problems *CT scan of the head without contrast demonstrated low density in the central pons and central pontomesencephalic region. The radiologist noticed this could be artifactual in nature and is otherwise clinically indicated may obtain MRI/MRA of the brain. Patient's physical exam from  a neurological standpoint is nonfocal at  this time - no clinical evidence to suggest acute ischemic neurologic event   HTN (hypertension) *Blood pressure well controlled at this time *Continue clonidine and labetalol *Consider resuming Norvasc tomorrow if blood pressure remains elevated   Disposition *Transfer to telemetry   LOS: 3 days   Erin Hearing, ANP pager [phone]  [facility] [email or web address] Password: [email or web address]  11/06/2011, 11:55 AM  I have personally examined this patient and reviewed the entire database. I have reviewed the above note, made any necessary editorial changes, and agree with its content.  Cherene Altes, MD [facility]

## 2011-11-06 NOTE — Progress Notes (Signed)
Received pt. As a transfer from 2900.pt. Is alert and oriented,having difficulty with some blurry vision.pt. needs one assessing to ambulate.pt. Has telemetry box # [id number].keep assessing pt. Needs closely

## 2011-11-06 NOTE — Progress Notes (Addendum)
Inpatient Diabetes Program Recommendations  AACE/ADA: New Consensus Statement on Inpatient Glycemic Control (2009)  Target Ranges:  Prepandial:   less than 140 mg/dL      Peak postprandial:   less than 180 mg/dL (1-2 hours)      Critically ill patients:  140 - 180 mg/dL   Fasting CBG still elevated >300  Patient received a total basal insulin yesterday Lantus 35 units.    Inpatient Diabetes Program Recommendations Insulin - Basal: Increase Lantus to 50 units  Needs Outpatient Education Referral to the [facility]  Note: Diabetes Coordinator will speak with patient today concerning new diagnosis DM.    Add: 11:30 spoke with patient concerning new diagnosis.  She is accepting of giving herself insulin at home.  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 She has Medicaid and the insulin pens are covered.  Will order an insulin pen starter kit for education.  Encouraged patient to view DM videos [id number].   Will continue to follow during this admission.  Thank you  Raoul Pitch [facility] Inpatient Diabetes Coordinator [phone]

## 2011-11-06 NOTE — Progress Notes (Signed)
S: Feels better.  No abd pain.. No nausea.  Eating. O:BP 130/83  Pulse 103  Temp(Src) 99 F (37.2 C) (Oral)  Resp 16  Ht 6' (1.829 m)  Wt 136.36 kg (300 lb 9.9 oz)  BMI 40.77 kg/m2  SpO2 98%  LMP 10/08/2011  Intake/Output Summary (Last 24 hours) at 11/06/11 0730 Last data filed at 11/06/11 0600  Gross per 24 hour  Intake 4209.5 ml  Output   1515 ml  Net 2694.5 ml   Weight change:  [id number] and alert [id number] Reg Resp:clear Abd:+ BS NTND Ext:no edema NEURO:Ox3 CNI      . cloNIDine  0.1 mg Oral TID  . heparin  5,000 Units Subcutaneous Q8H  . insulin aspart  0-20 Units Subcutaneous TID WC  . insulin aspart  0-5 Units Subcutaneous QHS  . insulin aspart  6 Units Subcutaneous TID WC  . insulin glargine  20 Units Subcutaneous QHS  . labetalol  200 mg Oral BID  . lidocaine  5 mL Infiltration Once  . mycophenolate  720 mg Oral BID  . pantoprazole  40 mg Oral Daily  . phosphorus  500 mg Oral TID  . pneumococcal 23 valent vaccine  0.5 mL Intramuscular Tomorrow-1000  . predniSONE  5 mg Oral Daily  . tacrolimus  3 mg Oral BID  . tacrolimus  5 mg Oral BID  . DISCONTD: insulin aspart  4 Units Subcutaneous TID WC   No results found. BMET    Component Value Date/Time   NA 144 11/06/2011 0430   K 3.6 11/06/2011 0430   CL 115* 11/06/2011 0430   CO2 15* 11/06/2011 0430   GLUCOSE 309* 11/06/2011 0430   BUN 56* 11/06/2011 0430   CREATININE 2.68* 11/06/2011 0430   CALCIUM 8.2* 11/06/2011 0430   GFRNONAA 20* 11/06/2011 0430   GFRAA 23* 11/06/2011 0430   CBC    Component Value Date/Time   WBC 5.8 11/05/2011 0230   RBC 4.64 11/05/2011 0230   HGB 13.3 11/05/2011 0230   HCT 37.7 11/05/2011 0230   PLT 142* 11/05/2011 0230   MCV 81.3 11/05/2011 0230   MCH 28.7 11/05/2011 0230   MCHC 35.3 11/05/2011 0230   RDW 14.2 11/05/2011 0230   LYMPHSABS 0.5* 11/03/2011 1217   MONOABS 0.3 11/03/2011 1217   EOSABS 0.0 11/03/2011 1217   BASOSABS 0.0 11/03/2011 1217     Assessment: 1. Acute on CKD most likely  sec to volume depletion, improving 2. DKA 3. Hypophosphatemia, improved 4. Hypercalcemia, improved  Plan: 1. Cont IV fluids 2.  Dc neutraphos 3.  Awaiting prograf level 4. Dc foley 5. Increase ambulation 4.  Daily Scr   Pietro Bonura T

## 2011-11-07 DIAGNOSIS — E1010 Type 1 diabetes mellitus with ketoacidosis without coma: Secondary | ICD-10-CM

## 2011-11-07 DIAGNOSIS — R4182 Altered mental status, unspecified: Secondary | ICD-10-CM

## 2011-11-07 DIAGNOSIS — N179 Acute kidney failure, unspecified: Secondary | ICD-10-CM

## 2011-11-07 DIAGNOSIS — R6510 Systemic inflammatory response syndrome (SIRS) of non-infectious origin without acute organ dysfunction: Secondary | ICD-10-CM

## 2011-11-07 LAB — RENAL FUNCTION PANEL
Albumin: 2.9 g/dL — ABNORMAL LOW (ref 3.5–5.2)
Chloride: 114 mEq/L — ABNORMAL HIGH (ref 96–112)
GFR calc Af Amer: 36 mL/min — ABNORMAL LOW (ref >90)
GFR calc non Af Amer: 31 mL/min — ABNORMAL LOW (ref >90)
Potassium: 3.7 mEq/L (ref 3.5–5.1)

## 2011-11-07 LAB — GLUCOSE, CAPILLARY
Glucose-Capillary: 253 mg/dL — ABNORMAL HIGH (ref 70–99)
Glucose-Capillary: 255 mg/dL — ABNORMAL HIGH (ref 70–99)
Glucose-Capillary: 248 mg/dL — ABNORMAL HIGH (ref 70–99)

## 2011-11-07 MED ORDER — TACROLIMUS 1 MG PO CAPS
8.0000 mg | ORAL_CAPSULE | Freq: Two times a day (BID) | ORAL | Status: DC
  Administered 2011-11-07 – 2011-11-08 (×3): 8 mg via ORAL
  Filled 2011-11-07 (×4): qty 8

## 2011-11-07 MED ORDER — PNEUMOCOCCAL VAC POLYVALENT 25 MCG/0.5ML IJ INJ
0.5000 mL | INJECTION | INTRAMUSCULAR | Status: AC
  Administered 2011-11-07: 0.5 mL via INTRAMUSCULAR
  Filled 2011-11-07: qty 0.5

## 2011-11-07 MED ORDER — AMLODIPINE BESYLATE 10 MG PO TABS
10.0000 mg | ORAL_TABLET | Freq: Every day | ORAL | Status: DC
  Administered 2011-11-07 – 2011-11-08 (×2): 10 mg via ORAL
  Filled 2011-11-07 (×2): qty 1

## 2011-11-07 MED ORDER — INSULIN ASPART 100 UNIT/ML ~~LOC~~ SOLN
10.0000 [IU] | Freq: Three times a day (TID) | SUBCUTANEOUS | Status: DC
  Administered 2011-11-07 – 2011-11-08 (×3): 10 [IU] via SUBCUTANEOUS

## 2011-11-07 MED ORDER — LIVING WELL WITH DIABETES BOOK
Freq: Once | Status: AC
  Administered 2011-11-07: 08:00:00
  Filled 2011-11-07: qty 1

## 2011-11-07 MED ORDER — INSULIN GLARGINE 100 UNIT/ML ~~LOC~~ SOLN
55.0000 [IU] | Freq: Every day | SUBCUTANEOUS | Status: DC
  Administered 2011-11-07: 55 [IU] via SUBCUTANEOUS

## 2011-11-07 NOTE — Progress Notes (Signed)
S: Feels better.  No abd pain.. No nausea.  Eating. O:BP 154/95  Pulse 92  Temp(Src) 98.2 F (36.8 C) (Oral)  Resp 18  Ht 6' (1.829 m)  Wt 136.1 kg (300 lb 0.7 oz)  BMI 40.69 kg/m2  SpO2 95%  LMP 10/08/2011  Intake/Output Summary (Last 24 hours) at 11/07/11 1014 Last data filed at 11/07/11 0700  Gross per 24 hour  Intake   1120 ml  Output      0 ml  Net   1120 ml   Weight change:  [id number] and alert [id number] Reg Resp:clear Abd:+ BS NTND Ext:no edema NEURO:Ox3 CNI      . cloNIDine  0.1 mg Oral TID  . Flexpen Starter Kit  1 kit Other Once  . heparin  5,000 Units Subcutaneous Q8H  . insulin aspart  0-20 Units Subcutaneous TID WC  . insulin aspart  0-5 Units Subcutaneous QHS  . insulin aspart  8 Units Subcutaneous TID WC  . insulin glargine  50 Units Subcutaneous QHS  . labetalol  200 mg Oral BID  . lidocaine  5 mL Infiltration Once  . living well with diabetes book   Does not apply Once  . mycophenolate  720 mg Oral BID  . pantoprazole  40 mg Oral Daily  . predniSONE  5 mg Oral Daily  . tacrolimus  8 mg Oral BID  . DISCONTD: tacrolimus  3 mg Oral BID  . DISCONTD: tacrolimus  5 mg Oral BID   No results found. BMET    Component Value Date/Time   NA 144 11/06/2011 0430   K 3.6 11/06/2011 0430   CL 115* 11/06/2011 0430   CO2 15* 11/06/2011 0430   GLUCOSE 309* 11/06/2011 0430   BUN 56* 11/06/2011 0430   CREATININE 2.68* 11/06/2011 0430   CALCIUM 8.2* 11/06/2011 0430   GFRNONAA 20* 11/06/2011 0430   GFRAA 23* 11/06/2011 0430   CBC    Component Value Date/Time   WBC 5.8 11/05/2011 0230   RBC 4.64 11/05/2011 0230   HGB 13.3 11/05/2011 0230   HCT 37.7 11/05/2011 0230   PLT 142* 11/05/2011 0230   MCV 81.3 11/05/2011 0230   MCH 28.7 11/05/2011 0230   MCHC 35.3 11/05/2011 0230   RDW 14.2 11/05/2011 0230   LYMPHSABS 0.5* 11/03/2011 1217   MONOABS 0.3 11/03/2011 1217   EOSABS 0.0 11/03/2011 1217   BASOSABS 0.0 11/03/2011 1217     Assessment: 1. Acute on CKD most likely sec to volume  depletion, improving 2. DKA 3. Hypophosphatemia, improved 4. Hypercalcemia, improved  Plan: 1. DC IV fluids 2. She needs education on giving herself insulin 3. Awaiting labs.  Prograf level OK at 7.3  Elliyah Liszewski T

## 2011-11-07 NOTE — Care Management Note (Signed)
    Page 1 of 1   11/07/2011     3:11:48 PM   CARE MANAGEMENT NOTE 11/07/2011  Patient:  [name], [name]   Account Number:  [id number]  Date Initiated:  11/07/2011  Documentation initiated by:  Elissa Hefty  Subjective/Objective Assessment:   adm w dka     Action/Plan:   lives alone   Anticipated DC Date:  11/09/2011   Anticipated DC Plan:  [place]  CM consult  Follow-up appt scheduled      Choice offered to / List presented to:             Status of service:   Medicare Important Message given?   (If response is "NO", the following Medicare IM given date fields will be blank) Date Medicare IM given:   Date Additional Medicare IM given:    Discharge Disposition:  HOME/SELF CARE  Per UR Regulation:    If discussed at Long Length of Stay Meetings, dates discussed:    Comments:  11/07/11 11:53a debbie Ryanna Teschner rn,bsn [id number] appt w Dayna Ramus w [facility] [phone] on 5-14 at 3:15pm but be there at 3pm, pt in agreement.

## 2011-11-07 NOTE — Progress Notes (Signed)
Glycemic Control Recommendations     Patient with new onset Diabetes and will be discharged home on insulin.  Diabetes Coordinator has already assessed and spoken with patient.  All education resources ordered and given to patient.  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  Have patient practice giving injections to herself with scheduled insulin to increase confidence.     Please order Diabetes outpatient education.    May consider increasing Lantus by 20% (Lantus 60 units daily) if fasting glucose remain elevated tomorrow.    Increase meal coverage to Novolog 10 units TID

## 2011-11-07 NOTE — Progress Notes (Signed)
Pt son and daughter came and so I demonstrated step-by-step how to draw up as well as administer insulin. I educated about administration of insulin and importance of rotating sites. I also educated them with the use of Living Well with Diabetes booklet. I also educated them on nutrition and shared the handouts about carb counting that was left by the diabetes coordinator. Her son and daughter agreed to understanding. Pt said she understands everything. Will continue to monitor.

## 2011-11-07 NOTE — Progress Notes (Signed)
Subjective Feeling better    Objective: Vital signs in last 24 hours: Filed Vitals:   11/06/11 1800 11/06/11 2042 11/07/11 0525 11/07/11 0953  BP: 137/79 121/83 136/85 154/95  Pulse: 83 98 93 92  Temp: 97.8 F (36.6 C) 98.3 F (36.8 C) 98.5 F (36.9 C) 98.2 F (36.8 C)  TempSrc: Oral Oral Oral Oral  Resp: 20 20 20 18   Height:      Weight:  136.1 kg (300 lb 0.7 oz)    SpO2: 100% 100% 100% 95%   Weight change:   Intake/Output Summary (Last 24 hours) at 11/07/11 1149 Last data filed at 11/07/11 0700  Gross per 24 hour  Intake   1020 ml  Output      0 ml  Net   1020 ml    Physical Exam: General: Awake, Oriented, No acute distress. HEENT: EOMI. Neck: Supple CV: S1 and S2, rrr Lungs: Clear to ascultation bilaterally, no wheezing Abdomen: Soft, Nontender, Nondistended, +bowel sounds. Ext: Good pulses. Trace edema.   Lab Results:  Basename 11/07/11 1040 11/06/11 0430  NA 142 144  K 3.7 3.6  CL 114* 115*  CO2 18* 15*  GLUCOSE 312* 309*  BUN 37* 56*  CREATININE 1.87* 2.68*  CALCIUM 9.0 8.2*  MG -- --  PHOS 2.4 4.6    Basename 11/07/11 1040 11/06/11 0430  AST -- --  ALT -- --  ALKPHOS -- --  BILITOT -- --  PROT -- --  ALBUMIN 2.9* 2.9*   No results found for this basename: LIPASE:2,AMYLASE:2 in the last 72 hours  Basename 11/05/11 0230  WBC 5.8  NEUTROABS --  HGB 13.3  HCT 37.7  MCV 81.3  PLT 142*   No results found for this basename: CKTOTAL:3,CKMB:3,CKMBINDEX:3,TROPONINI:3 in the last 72 hours No components found with this basename: POCBNP:3 No results found for this basename: DDIMER:2 in the last 72 hours No results found for this basename: HGBA1C:2 in the last 72 hours No results found for this basename: CHOL:2,HDL:2,LDLCALC:2,TRIG:2,CHOLHDL:2,LDLDIRECT:2 in the last 72 hours No results found for this basename: TSH,T4TOTAL,FREET3,T3FREE,THYROIDAB in the last 72 hours No results found for this basename:  VITAMINB12:2,FOLATE:2,FERRITIN:2,TIBC:2,IRON:2,RETICCTPCT:2 in the last 72 hours  Micro Results: Recent Results (from the past 240 hour(s))  CULTURE, BLOOD (ROUTINE X 2)     Status: Normal (Preliminary result)   Collection Time   11/03/11  2:10 PM      Component Value Range Status Comment   Specimen Description BLOOD RIGHT HAND   Final    Special Requests BOTTLES DRAWN AEROBIC AND ANAEROBIC 10CC   Final    Culture  Setup Time [id number]   Final    Culture     Final    Value:        BLOOD CULTURE RECEIVED NO GROWTH TO DATE CULTURE WILL BE HELD FOR 5 DAYS BEFORE ISSUING A FINAL NEGATIVE REPORT   Report Status PENDING   Incomplete   CULTURE, BLOOD (ROUTINE X 2)     Status: Normal (Preliminary result)   Collection Time   11/03/11  2:30 PM      Component Value Range Status Comment   Specimen Description BLOOD RIGHT HAND   Final    Special Requests BOTTLES DRAWN AEROBIC AND ANAEROBIC 10CC   Final    Culture  Setup Time [id number]   Final    Culture     Final    Value:        BLOOD CULTURE RECEIVED NO GROWTH TO DATE CULTURE WILL  BE HELD FOR 5 DAYS BEFORE ISSUING A FINAL NEGATIVE REPORT   Report Status PENDING   Incomplete   MRSA PCR SCREENING     Status: Normal   Collection Time   11/03/11  7:57 PM      Component Value Range Status Comment   MRSA by PCR NEGATIVE  NEGATIVE  Final     Studies/Results: No results found.  Medications: I have reviewed the patient's current medications. Scheduled Meds:   . cloNIDine  0.1 mg Oral TID  . Flexpen Starter Kit  1 kit Other Once  . heparin  5,000 Units Subcutaneous Q8H  . insulin aspart  0-20 Units Subcutaneous TID WC  . insulin aspart  0-5 Units Subcutaneous QHS  . insulin aspart  8 Units Subcutaneous TID WC  . insulin glargine  50 Units Subcutaneous QHS  . labetalol  200 mg Oral BID  . lidocaine  5 mL Infiltration Once  . living well with diabetes book   Does not apply Once  . mycophenolate  720 mg Oral BID  . pantoprazole  40 mg Oral  Daily  . predniSONE  5 mg Oral Daily  . tacrolimus  8 mg Oral BID  . DISCONTD: tacrolimus  3 mg Oral BID  . DISCONTD: tacrolimus  5 mg Oral BID   Continuous Infusions:   . DISCONTD: sodium chloride 100 mL/hr at 11/07/11 0926   PRN Meds:.acetaminophen, albuterol, dextrose, hydrALAZINE, ondansetron (ZOFRAN) IV  Assessment/Plan: DKA (diabetic ketoacidoses)/ Diabetes mellitus, new onset  *CBGs consistently greater than 200 and closer to 300.  *Over the past 24 hours while insulin drip patient used a total of 400 units  *Increase Lantus to 50 units daily and monitor  *Continue sliding scale insulin resistant scale and increase meal coverage dosage  *Once status is more stable will need diabetes educator consult and instruction regarding management of diabetes   Acute hypernatremia/ Fluid volume depletion  *Presented with normal sodium in the setting of serum glucose greater than 1000- likely falsely normal sodium and setting the patient was profoundly dehydrated.  *Sodium has normalized but still remains dry  *Continue IV fluids but decrease to 100 cc per hour-hopefully can discontinue IV fluids in the next 24-48 hour   Acute-on-chronic renal failure/history of renal transplant 1.5 years ago  *Patient reports baseline creatinine around 1.5- peak creatinine this admission 3.97 and has trended downward *Appreciate nephrology assistance  *Continue antirejection medications including her chronic prednisone  *Pharmacist is helping [place] to clarify all of her antirejection medication - they have clarified that the patient has not taken Valcyte for some time.   SIRS (systemic inflammatory response syndrome)  *At this point no obvious source of any infection and suspect acidosis and elevated Procalcitonin due to low perfusion from persistent dehydration and acute renal failure  *Empiric antibiotic coverage of Vancomycin, Maxipime, and Levaquin were discontinued 11/04/2011.   Metabolic acidosis:  due to DKA and ARF  *DKA has now resolved - persistent metabolic acidosis is now most c/w her acute renal insuff   Altered mental state/toxic metabolic encephalopathy  *Symptomatology has improved after treatment of the above stated problems  *CT scan of the head without contrast demonstrated low density in the central pons and central pontomesencephalic region. The radiologist noticed this could be artifactual in nature and is otherwise clinically indicated may obtain MRI/MRA of the brain. Patient's physical exam from a neurological standpoint is nonfocal at this time - no clinical evidence to suggest acute ischemic neurologic event  HTN (hypertension)  *Blood pressure well controlled at this time  *Continue clonidine and labetalol  *restart norvasc   Hope to D/C tomm? Needs PCP   LOS: 4 days  Rafaela Dinius, DO 11/07/2011, 11:49 AM

## 2011-11-07 NOTE — Progress Notes (Signed)
Pt hasn't been able to draw up insulin because she still has blurry vision and unable to see as well. Awaiting family to come to teach family how to draw up and administer insulin.

## 2011-11-07 NOTE — Plan of Care (Signed)
Problem: Food- and Nutrition-Related Knowledge Deficit (NB-1.1) Goal: Nutrition education Formal process to instruct or train a patient/client in a skill or to impart knowledge to help patients/clients voluntarily manage or modify food choices and eating behavior to maintain or improve health.  Outcome: Completed/Met Date Met:  11/07/11 RD consulted for diet education.  Pt newly dx with DM. Current weight is 300#, BMI is 40.8 - pt is morbidly obese (Obesity Class III). Current intake is 75% of CHO Modified Medium Diet.  Discussed CHO Counting, importance of watching portion sizes, and cutting back on regular soda/juice intake. Encouraged protein and scheduled meal/snack intake.  Labs and meds reviewed. No other nutrition issues identified at this time. Handouts provided. Re-consult RD for any additional nutrition concerns.  Asencion Partridge Pager#: [phone]

## 2011-11-08 DIAGNOSIS — N179 Acute kidney failure, unspecified: Secondary | ICD-10-CM

## 2011-11-08 DIAGNOSIS — R4182 Altered mental status, unspecified: Secondary | ICD-10-CM

## 2011-11-08 DIAGNOSIS — E1010 Type 1 diabetes mellitus with ketoacidosis without coma: Secondary | ICD-10-CM

## 2011-11-08 DIAGNOSIS — R6510 Systemic inflammatory response syndrome (SIRS) of non-infectious origin without acute organ dysfunction: Secondary | ICD-10-CM

## 2011-11-08 LAB — RENAL FUNCTION PANEL
Albumin: 2.9 g/dL — ABNORMAL LOW (ref 3.5–5.2)
Chloride: 116 mEq/L — ABNORMAL HIGH (ref 96–112)
GFR calc Af Amer: 43 mL/min — ABNORMAL LOW (ref >90)
Phosphorus: 2.7 mg/dL (ref 2.3–4.6)
Potassium: 3.7 mEq/L (ref 3.5–5.1)
Sodium: 143 mEq/L (ref 135–145)

## 2011-11-08 LAB — GLUCOSE, CAPILLARY: Glucose-Capillary: 239 mg/dL — ABNORMAL HIGH (ref 70–99)

## 2011-11-08 MED ORDER — SULFAMETHOXAZOLE-TRIMETHOPRIM 400-80 MG PO TABS: 1.0000 | ORAL_TABLET | ORAL | Status: AC

## 2011-11-08 MED ORDER — INSULIN GLARGINE 100 UNIT/ML ~~LOC~~ SOLN: 55.0000 [IU] | Freq: Every day | SUBCUTANEOUS | Status: DC

## 2011-11-08 MED ORDER — INSULIN ASPART 100 UNIT/ML ~~LOC~~ SOLN: 10.0000 [IU] | Freq: Three times a day (TID) | SUBCUTANEOUS | Status: DC

## 2011-11-08 MED ORDER — TACROLIMUS 1 MG PO CAPS: 8.0000 mg | ORAL_CAPSULE | Freq: Two times a day (BID) | ORAL | Status: DC

## 2011-11-08 MED ORDER — SULFAMETHOXAZOLE-TRIMETHOPRIM 400-80 MG PO TABS
1.0000 | ORAL_TABLET | ORAL | Status: DC
  Administered 2011-11-08: 1 via ORAL
  Filled 2011-11-08: qty 1

## 2011-11-08 MED ORDER — FREESTYLE SYSTEM KIT: 1.0000 | PACK | Status: AC | PRN

## 2011-11-08 NOTE — Progress Notes (Signed)
Pt c/o mild recurrent headache since evening of 5/1. Resolves with tylenol before returning.  Marchelle Gearing RN

## 2011-11-08 NOTE — Discharge Summary (Addendum)
Discharge Summary  [name] MR#: [id number]  DOB:05/04/1966  Date of Admission: 11/03/2011 Date of Discharge: 11/08/2011  Patient's PCP: No primary provider on file.  Attending Physician:Dani Wallner  Consults: Treatment Team:  Windy Kalata, MD   Discharge Diagnoses: Principal Problem:  *DKA (diabetic ketoacidoses) Active Problems:  Altered mental state  Acute-on-chronic renal failure  HTN (hypertension)  Acute hypernatremia  Fluid volume depletion  SIRS (systemic inflammatory response syndrome)  Metabolic acidosis: due to DKA and ARF  Diabetes mellitus, new onset   Brief Admitting History and Physical 46 year old African American Female with past medical history of Renal Transplant-on immunosuppressives, HTN comes in with the above complaints. Patient although lethargic and restless, is able to follow commands and answer almost all my questions appropriately, but is not able to fully participate in the history taking process. History is taken from patient's daughter at bedside, apparently for the past 3-4 weeks, patient has been feeling very weak. Over the Easter holidays she developed "cold like symptoms" and was having a nagging cough. However over the past 2 days she became more weak, apparently was weak and needed assistance walking to the bathroom,she however was alert and awake. The daughter claims that over the past few weeks patient has complained of increased thirst,frequent urination as well. This morning the daughter found the patient very lethargic and very altered and was brought to the ED. She was found to be tachycardic, had elevated blood sugars and I was called to admit the patient. During my evaluation,the daughter claimed that patient was slowly getting better and was answering questions appropriately, the RN also confirmed that during the ED stay patient has already shown clinical improvement.Upon repeatedly questioning the patient-the patient  denies-Headache, Neck pain, Fever, shortness of breath, chest pain,abd pain,nausea/vomiting or diarrhea.She is not a known diabetic, and has had a renal transplant at [facility] approximately 1.5 years back. Unknown what her baseline renal function is like after transplant.   Discharge Medications Medication List  As of 11/08/2011 11:26 AM   STOP taking these medications         tacrolimus 5 MG capsule      valGANciclovir 450 MG tablet         TAKE these medications         amLODipine 10 MG tablet   Commonly known as: NORVASC   Take 10 mg by mouth daily.      cloNIDine 0.2 MG tablet   Commonly known as: CATAPRES   Take 0.2 mg by mouth 3 (three) times daily.      glucose monitoring kit monitoring kit   1 each by Does not apply route as needed for other. For QID testing- test strips and lancets      insulin aspart 100 UNIT/ML injection   Commonly known as: novoLOG   Inject 10 Units into the skin 3 (three) times daily with meals.      insulin glargine 100 UNIT/ML injection   Commonly known as: LANTUS   Inject 55 Units into the skin at bedtime.      labetalol 200 MG tablet   Commonly known as: NORMODYNE   Take 200 mg by mouth 3 (three) times daily.      mycophenolate 180 MG EC tablet   Commonly known as: MYFORTIC   Take 720 mg by mouth 2 (two) times daily.      nystatin 100000 UNIT/ML suspension   Commonly known as: MYCOSTATIN   Take 500,000 Units by mouth 3 (  three) times daily. Swish and swallow      omeprazole 20 MG capsule   Commonly known as: PRILOSEC   Take 20 mg by mouth daily.      predniSONE 5 MG tablet   Commonly known as: DELTASONE   Take 5 mg by mouth daily.      sulfamethoxazole-trimethoprim 400-80 MG per tablet   Commonly known as: BACTRIM,SEPTRA   Take 1 tablet by mouth 3 (three) times a week.      tacrolimus 1 MG capsule   Commonly known as: PROGRAF   Take 8 capsules (8 mg total) by mouth 2 (two) times daily.            Hospital Course: DKA  (diabetic ketoacidoses)/ Diabetes mellitus, new onset  *Increase Lantus to 55 units daily and monitor  *increase meal coverage dosage- may need to add sliding scale *diabetes coordinator followed   Acute hypernatremia/ Fluid volume depletion  *Presented with normal sodium in the setting of serum glucose greater than 1000- likely falsely normal sodium and setting the patient was profoundly dehydrated.  *Sodium has normalized   Acute-on-chronic renal failure/history of renal transplant 1.5 years ago  *Patient reports baseline creatinine around 1.5- peak creatinine this admission 3.97 and has trended downward  *nephrology followed *Continue antirejection medications including her chronic prednisone   SIRS (systemic inflammatory response syndrome)  *At this point no obvious source of any infection and suspect acidosis and elevated Procalcitonin due to low perfusion from persistent dehydration and acute renal failure  *Empiric antibiotic coverage of Vancomycin, Maxipime, and Levaquin were discontinued [id number]   Metabolic acidosis: due to DKA and ARF  *DKA has now resolved - persistent metabolic acidosis is now most c/w her acute renal insuff   Altered mental state/toxic metabolic encephalopathy  *Symptomatology has improved after treatment of the above stated problems  *CT scan of the head without contrast demonstrated low density in the central pons and central pontomesencephalic region. The radiologist noticed this could be artifactual in nature and is otherwise clinically indicated may obtain MRI/MRA of the brain. Patient's physical exam from a neurological standpoint is nonfocal at this time - no clinical evidence to suggest acute ischemic neurologic event   HTN (hypertension)  *Blood pressure well controlled at this time  *Continue clonidine and labetalol and norvasc      Day of Discharge BP 159/95  Pulse 82  Temp(Src) 97.7 F (36.5 C) (Oral)  Resp 20  Ht 6' (1.829 m)  Wt  136.1 kg (300 lb 0.7 oz)  BMI 40.69 kg/m2  SpO2 99%  LMP 10/08/2011 A+O x3 NAD -c/c/e +BS, soft, NT/ND  Results for orders placed during the hospital encounter of 11/03/11 (from the past 48 hour(s))  GLUCOSE, CAPILLARY     Status: Abnormal   Collection Time   11/06/11 12:08 PM      Component Value Range Comment   Glucose-Capillary 490 (*) 70 - 99 (mg/dL)   GLUCOSE, CAPILLARY     Status: Abnormal   Collection Time   11/06/11  5:09 PM      Component Value Range Comment   Glucose-Capillary 397 (*) 70 - 99 (mg/dL)    Comment 1 Documented in Chart      Comment 2 Notify RN     GLUCOSE, CAPILLARY     Status: Abnormal   Collection Time   11/06/11  9:36 PM      Component Value Range Comment   Glucose-Capillary 395 (*) 70 - 99 (mg/dL)  Comment 1 Notify RN      Comment 2 Documented in Chart     GLUCOSE, CAPILLARY     Status: Abnormal   Collection Time   11/07/11  8:02 AM      Component Value Range Comment   Glucose-Capillary 253 (*) 70 - 99 (mg/dL)    Comment 1 Documented in Chart      Comment 2 Notify RN     RENAL FUNCTION PANEL     Status: Abnormal   Collection Time   11/07/11 10:40 AM      Component Value Range Comment   Sodium 142  135 - 145 (mEq/L)    Potassium 3.7  3.5 - 5.1 (mEq/L)    Chloride 114 (*) 96 - 112 (mEq/L)    CO2 18 (*) 19 - 32 (mEq/L)    Glucose, Bld 312 (*) 70 - 99 (mg/dL)    BUN 37 (*) 6 - 23 (mg/dL)    Creatinine, Ser 1.87 (*) 0.50 - 1.10 (mg/dL)    Calcium 9.0  8.4 - 10.5 (mg/dL)    Phosphorus 2.4  2.3 - 4.6 (mg/dL)    Albumin 2.9 (*) 3.5 - 5.2 (g/dL)    GFR calc non Af Amer 31 (*) >90 (mL/min)    GFR calc Af Amer 36 (*) >90 (mL/min)   GLUCOSE, CAPILLARY     Status: Abnormal   Collection Time   11/07/11 11:39 AM      Component Value Range Comment   Glucose-Capillary 255 (*) 70 - 99 (mg/dL)    Comment 1 Documented in Chart      Comment 2 Notify RN     GLUCOSE, CAPILLARY     Status: Abnormal   Collection Time   11/07/11  4:53 PM      Component Value Range  Comment   Glucose-Capillary 255 (*) 70 - 99 (mg/dL)    Comment 1 Documented in Chart      Comment 2 Notify RN     GLUCOSE, CAPILLARY     Status: Abnormal   Collection Time   11/07/11  9:22 PM      Component Value Range Comment   Glucose-Capillary 248 (*) 70 - 99 (mg/dL)    Comment 1 Notify RN      Comment 2 Documented in Chart     RENAL FUNCTION PANEL     Status: Abnormal   Collection Time   11/08/11  5:00 AM      Component Value Range Comment   Sodium 143  135 - 145 (mEq/L)    Potassium 3.7  3.5 - 5.1 (mEq/L)    Chloride 116 (*) 96 - 112 (mEq/L)    CO2 19  19 - 32 (mEq/L)    Glucose, Bld 238 (*) 70 - 99 (mg/dL)    BUN 29 (*) 6 - 23 (mg/dL)    Creatinine, Ser 1.61 (*) 0.50 - 1.10 (mg/dL)    Calcium 9.5  8.4 - 10.5 (mg/dL)    Phosphorus 2.7  2.3 - 4.6 (mg/dL)    Albumin 2.9 (*) 3.5 - 5.2 (g/dL)    GFR calc non Af Amer 37 (*) >90 (mL/min)    GFR calc Af Amer 43 (*) >90 (mL/min)   GLUCOSE, CAPILLARY     Status: Abnormal   Collection Time   11/08/11  8:08 AM      Component Value Range Comment   Glucose-Capillary 232 (*) 70 - 99 (mg/dL)    Comment 1 Documented in Chart  Comment 2 Notify RN       Ct Head Wo Contrast  11/03/2011  *RADIOLOGY REPORT*  Clinical Data: Altered mental status.  Weakness.  CT HEAD WITHOUT CONTRAST  Technique:  Contiguous axial images were obtained from the base of the skull through the vertex without contrast.  Comparison: 02/22/2007.  Findings: The subarachnoid spaces remain minimally prominent. Normal size and position of the ventricles.  Low density in the central pons, in a region of streak artifacts.  There is also some low density in the central pontomesencephalic junction region. There are less streak artifacts in that region.  No intracranial hemorrhage or mass lesion seen.  Unremarkable bones and paranasal sinuses.  IMPRESSION: Low density in the central pons and central pontomesencephalic region.  This could be artifactual in nature or represent  subacute ischemic changes.  If clinically indicated, pre and postcontrast magnetic resonance imaging of the brain and MRA may be helpful.  Original Report Authenticated By: Gerald Stabs, M.D.   Dg Chest Port 1 View  11/03/2011  *RADIOLOGY REPORT*  Clinical Data: Left subclavian central line placement.  PORTABLE CHEST - 1 VIEW  Comparison: 11/03/2011.  Findings: 2052 hours.  Left subclavian central venous catheter tip is within the mid SVC near the azygos vein.  The heart size and mediastinal contours are stable. There is no pneumothorax.  There are persistent low lung volumes with patchy bibasilar air space opacities.  IMPRESSION:  1.  Central line placement without demonstrated complication. 2.  Unchanged bibasilar air space opacities.  Original Report Authenticated By: Vivia Ewing, M.D.   Dg Chest Portable 1 View  11/03/2011  *RADIOLOGY REPORT*  Clinical Data: Altered mental status, lethargy and weakness. Hyperglycemia.  PORTABLE CHEST - 1 VIEW  Comparison: 05/22/2009  Findings: Lung volumes are very low with bibasilar atelectasis present.  It would be difficult to exclude subtle bibasilar infiltrates.  No gross edema or pleural fluid.  Stable cardiomegaly.  IMPRESSION: Bibasilar atelectasis versus infiltrates.  No edema.  Original Report Authenticated By: Azzie Roup, M.D.     Disposition: home  Diet: cardiac/diabetic  Activity: as tolerated   Follow-up Appts: Discharge Orders    Future Appointments: Provider: Department: Dept Phone: Center:   11/19/2011 3:15 PM Lou Cal, MD [facility] [phone] [facility]     Future Orders Please Complete By Expires   Diet - low sodium heart healthy      Diet Carb Modified      Increase activity slowly      Discharge instructions      Comments:   Check BS QID, record and bring to PCP     Outpatient referral to opthomology   Time spent on discharge, talking to the patient, and coordinating care: 45 mins.   SignedEulogio Bear, DO 11/08/2011, 11:26 AM

## 2011-11-08 NOTE — Progress Notes (Signed)
S:No new CO.  Wants to go home O:BP 149/87  Pulse 86  Temp(Src) 97.7 F (36.5 C) (Oral)  Resp 19  Ht 6' (1.829 m)  Wt 136.1 kg (300 lb 0.7 oz)  BMI 40.69 kg/m2  SpO2 100%  LMP 10/08/2011  Intake/Output Summary (Last 24 hours) at 11/08/11 1042 Last data filed at 11/08/11 0304  Gross per 24 hour  Intake    480 ml  Output   1300 ml  Net   -820 ml   Weight change: 0 kg (0 lb) [id number] and alert CVS:RRR Resp:clear Abd:+ BS NTND Ext:no edema NEURO:Ox3 CNI      . amLODipine  10 mg Oral Daily  . cloNIDine  0.1 mg Oral TID  . heparin  5,000 Units Subcutaneous Q8H  . insulin aspart  0-20 Units Subcutaneous TID WC  . insulin aspart  0-5 Units Subcutaneous QHS  . insulin aspart  10 Units Subcutaneous TID WC  . insulin glargine  55 Units Subcutaneous QHS  . labetalol  200 mg Oral BID  . lidocaine  5 mL Infiltration Once  . mycophenolate  720 mg Oral BID  . pantoprazole  40 mg Oral Daily  . pneumococcal 23 valent vaccine  0.5 mL Intramuscular Tomorrow-1000  . pneumococcal 23 valent vaccine  0.5 mL Intramuscular Tomorrow-1000  . predniSONE  5 mg Oral Daily  . tacrolimus  8 mg Oral BID  . DISCONTD: insulin aspart  8 Units Subcutaneous TID WC  . DISCONTD: insulin glargine  50 Units Subcutaneous QHS   No results found. BMET    Component Value Date/Time   NA 143 11/08/2011 0500   K 3.7 11/08/2011 0500   CL 116* 11/08/2011 0500   CO2 19 11/08/2011 0500   GLUCOSE 238* 11/08/2011 0500   BUN 29* 11/08/2011 0500   CREATININE 1.61* 11/08/2011 0500   CALCIUM 9.5 11/08/2011 0500   GFRNONAA 37* 11/08/2011 0500   GFRAA 43* 11/08/2011 0500   CBC    Component Value Date/Time   WBC 5.8 11/05/2011 0230   RBC 4.64 11/05/2011 0230   HGB 13.3 11/05/2011 0230   HCT 37.7 11/05/2011 0230   PLT 142* 11/05/2011 0230   MCV 81.3 11/05/2011 0230   MCH 28.7 11/05/2011 0230   MCHC 35.3 11/05/2011 0230   RDW 14.2 11/05/2011 0230   LYMPHSABS 0.5* 11/03/2011 1217   MONOABS 0.3 11/03/2011 1217   EOSABS 0.0 11/03/2011  1217   BASOSABS 0.0 11/03/2011 1217     Assessment: 1. Acute on CKD most likely sec to volume depletion, renal Fx back to baseline 2. DKA 3. Hypophosphatemia, improved 4. Hypercalcemia, improved  Plan: 1.OK for DC 2. TO FU with Int Med for Diabetes management Kaleem Sartwell T

## 2011-11-09 LAB — CULTURE, BLOOD (ROUTINE X 2)
Culture  Setup Time: 201304281800
Culture: NO GROWTH

## 2011-11-19 ENCOUNTER — Ambulatory Visit (INDEPENDENT_AMBULATORY_CARE_PROVIDER_SITE_OTHER): Payer: Medicare Other | Admitting: Internal Medicine

## 2011-11-19 ENCOUNTER — Encounter: Payer: Self-pay | Admitting: Internal Medicine

## 2011-11-19 VITALS — BP 146/89 | HR 70 | Temp 98.2°F | Resp 20 | Ht 69.75 in | Wt 309.1 lb

## 2011-11-19 DIAGNOSIS — I10 Essential (primary) hypertension: Secondary | ICD-10-CM

## 2011-11-19 DIAGNOSIS — Z940 Kidney transplant status: Secondary | ICD-10-CM

## 2011-11-19 DIAGNOSIS — N179 Acute kidney failure, unspecified: Secondary | ICD-10-CM | POA: Diagnosis not present

## 2011-11-19 DIAGNOSIS — E119 Type 2 diabetes mellitus without complications: Secondary | ICD-10-CM | POA: Diagnosis not present

## 2011-11-19 MED ORDER — INSULIN GLARGINE 100 UNIT/ML ~~LOC~~ SOLN: 60.0000 [IU] | Freq: Every day | SUBCUTANEOUS | Status: DC

## 2011-11-19 MED ORDER — INSULIN ASPART 100 UNIT/ML ~~LOC~~ SOLN: 13.0000 [IU] | Freq: Three times a day (TID) | SUBCUTANEOUS | Status: DC

## 2011-11-19 NOTE — Patient Instructions (Signed)
-  Please increase Lantus (long acting insulin) to 60 units before bed. -Please increase your Aspart insulin (short acting) to 13 units before each meal  -Return in 2 weeks for diabetes follow up.  We may need to further change your insulin regimen depending on how well controlled your sugars are.  Instead of after breakfast, please check your blood sugar before breakfast, lunch and dinner.  Please be sure to bring all of your medications with you to every visit.  Should you have any new or worsening symptoms, please be sure to call the clinic at [phone].

## 2011-11-20 ENCOUNTER — Encounter: Payer: Self-pay | Admitting: Internal Medicine

## 2011-11-20 DIAGNOSIS — N2581 Secondary hyperparathyroidism of renal origin: Secondary | ICD-10-CM | POA: Diagnosis not present

## 2011-11-20 DIAGNOSIS — D509 Iron deficiency anemia, unspecified: Secondary | ICD-10-CM | POA: Diagnosis not present

## 2011-11-20 DIAGNOSIS — I10 Essential (primary) hypertension: Secondary | ICD-10-CM | POA: Diagnosis not present

## 2011-11-20 DIAGNOSIS — N179 Acute kidney failure, unspecified: Secondary | ICD-10-CM | POA: Diagnosis not present

## 2011-11-20 LAB — LIPID PANEL
Cholesterol: 156 mg/dL (ref 0–200)
Triglycerides: 135 mg/dL (ref <150)

## 2011-11-20 MED ORDER — INSULIN GLARGINE 100 UNIT/ML ~~LOC~~ SOLN: 60.0000 [IU] | Freq: Every day | SUBCUTANEOUS | Status: DC

## 2011-11-20 MED ORDER — INSULIN ASPART 100 UNIT/ML ~~LOC~~ SOLN: 13.0000 [IU] | Freq: Three times a day (TID) | SUBCUTANEOUS | Status: DC

## 2011-11-20 NOTE — Assessment & Plan Note (Signed)
Lab Results  Component Value Date   NA 143 11/08/2011   K 3.7 11/08/2011   CL 116* 11/08/2011   CO2 19 11/08/2011   BUN 29* 11/08/2011   CREATININE 1.61* 11/08/2011    BP Readings from Last 3 Encounters:  11/19/11 146/89  11/08/11 135/76    Assessment: Hypertension control:  mildly elevated  Progress toward goals:  deteriorated Barriers to meeting goals:  Not clear, as today is our first meeting.  Plan: Hypertension treatment:  continue current medications , I will wait until after her hospital followup with nephrology to make changes to her antihypertensive regimen She is currently taking amlodipine 10, clonidine 0.2 3 times a day, labetalol 200 3 times a day

## 2011-11-20 NOTE — Progress Notes (Signed)
Addended by: Lou Cal on: 11/20/2011 10:46 AM   Modules accepted: Orders

## 2011-11-20 NOTE — Assessment & Plan Note (Signed)
Hemoglobin A1c 10 diagnosis. New onset diabetes likely related to tacrolimus and family history. Patient seems compliant with medications, but she is obese. CBGs remains out of control, and I suspect that this is contributing to her symptoms of fatigue and headaches. We have a lot to do regarding her diabetes, but for today we only adjusted her insulin regimen. I will wait until her next hemoglobin A1c to refer her to [facility] for her diet management, as she was likely just educated during hospitalization.  Lipid panel drawn today.  Urine microalbumin deferred in setting of acute renal failure.  -Increase Lantus to 60 units before bed -Increase aspart to 13 units 3 times a day before meals -Continue CBG monitoring -Once lipid panel returns, discuss role for statin at next visit in 2 weeks -I will defer referral to eye exam until she has had more time to adjust her diabetes -Foot exam and next visit

## 2011-11-20 NOTE — Progress Notes (Signed)
  Subjective:   Patient ID: MARTINEZ HENAULT female   DOB: 1965/10/17 46 y.o.   MRN: [id number]  HPI: Ms.Nyjai D Giddens is a 46 y.o. woman with past medical history significant for renal transplant status post renal failure, hypertension, and new-onset diabetes mellitus. She is new to our clinic today. She is discharged from [facility] on 11/08/2011 after she was admitted in DKA. She had no prior history of diabetes, and this is likely induced by tacrolimus.  She has a strong family history of diabetes.  Since hospital discharge, she notes increased fatigue and feeling tired. She is an excellent job checking her blood sugars 6 times per day. She has remained compliant with 55 units of Lantus and 10 units of aspart 3 times a day. Her sugars remain out of control. Review of her clinical summary logbook suggests sugars as low as 223 and as high as 501. She has several readings of 501, so I suspect that that is is high as her meter reads.  She reports a generally good diet, including chicken and vegetables. She denies fried foods or desserts. She does not exercise rigorously, but does walk her dog daily.  She reports increased polyuria and polyphasia, but denies polydipsia. She is dizzy with sitting up. She notes a right-sided headache around her eye that is not throbbing. She does not have history of allergies.  She occasionally has bilateral blurry vision.   Review of Systems: Constitutional: Denies fever, chills, diaphoresis, appetite change  HEENT: Denies photophobia, eye pain, redness, hearing loss, ear pain, congestion, sore throat, rhinorrhea, sneezing, mouth sores, trouble swallowing, neck pain, neck stiffness and tinnitus.   Respiratory: Denies cough, chest tightness,  and wheezing.   increased shortness of breath and dyspnea on exertion but oxygenating well on room air Cardiovascular: Denies chest pain, palpitations and leg swelling.  Gastrointestinal: Denies nausea, vomiting, abdominal  pain, diarrhea, constipation, blood in stool and abdominal distention.  Genitourinary: Denies dysuria, urgency, hematuria, flank pain and difficulty urinating.  Musculoskeletal: Denies myalgias, back pain, joint swelling, arthralgias  Skin: Denies pallor, rash and wound.  Neurological: Denies dizziness, seizures, syncope, weakness, light-headedness, numbness and headaches.  Psychiatric/Behavioral: Denies suicidal ideation, mood changes, confusion, nervousness, sleep disturbance and agitation  Objective:  Physical Exam: Filed Vitals:   11/19/11 1617  BP: 146/89  Pulse: 70  Temp: 98.2 F (36.8 C)  TempSrc: Oral  Resp: 20  Height: 5' 9.75" (1.772 m)  Weight: 309 lb 1.6 oz (140.207 kg)  SpO2: 100%   Constitutional: Vital signs reviewed.  Patient is a an obese woman in no acute distress and cooperative with exam.  Mouth: no erythema or exudates, dry mucous membranes Eyes: PERRL, EOMI, conjunctivae normal, No scleral icterus.  Cardiovascular: RRR, S1 normal, S2 normal, no MRG, pulses symmetric and intact bilaterally Pulmonary/Chest: CTAB, no wheezes, rales, or rhonchi Abdominal: Soft. Non-tender, non-distended, bowel sounds are normal Musculoskeletal: No joint deformities, erythema, or stiffness, ROM full and no nontender Neurological: A&O x3, Strength is normal and symmetric bilaterally, cranial nerve II-XII are grossly intact, no focal motor deficit, sensory intact to light touch bilaterally.  Skin: Warm, dry and intact. No rash, cyanosis, or clubbing.  Psychiatric: Normal mood and affect. speech and behavior is normal. Judgment and thought content normal. Cognition and memory are normal.   Assessment & Plan:   Casing care discussed with Dr. Lynnae January. Please see problem-oriented chart for further details. Patient to return in 2 weeks for further titration of her insulin.

## 2011-11-20 NOTE — Assessment & Plan Note (Signed)
Creatinine was trending down hospital discharge. She has an appointment with Dr. clopidogrel on 11/20/2011. I will defer labs to renal.

## 2011-11-28 DIAGNOSIS — E785 Hyperlipidemia, unspecified: Secondary | ICD-10-CM | POA: Diagnosis not present

## 2011-11-28 DIAGNOSIS — D509 Iron deficiency anemia, unspecified: Secondary | ICD-10-CM | POA: Diagnosis not present

## 2011-11-28 DIAGNOSIS — Z940 Kidney transplant status: Secondary | ICD-10-CM | POA: Diagnosis not present

## 2011-11-28 DIAGNOSIS — N2581 Secondary hyperparathyroidism of renal origin: Secondary | ICD-10-CM | POA: Diagnosis not present

## 2011-12-04 ENCOUNTER — Other Ambulatory Visit: Payer: Self-pay | Admitting: *Deleted

## 2011-12-04 DIAGNOSIS — H5210 Myopia, unspecified eye: Secondary | ICD-10-CM | POA: Diagnosis not present

## 2011-12-04 DIAGNOSIS — H524 Presbyopia: Secondary | ICD-10-CM | POA: Diagnosis not present

## 2011-12-04 DIAGNOSIS — E1139 Type 2 diabetes mellitus with other diabetic ophthalmic complication: Secondary | ICD-10-CM | POA: Diagnosis not present

## 2011-12-04 DIAGNOSIS — H52229 Regular astigmatism, unspecified eye: Secondary | ICD-10-CM | POA: Diagnosis not present

## 2011-12-04 DIAGNOSIS — E119 Type 2 diabetes mellitus without complications: Secondary | ICD-10-CM

## 2011-12-04 MED ORDER — GLUCOSE BLOOD VI STRP: ORAL_STRIP | Status: DC

## 2011-12-09 ENCOUNTER — Ambulatory Visit (INDEPENDENT_AMBULATORY_CARE_PROVIDER_SITE_OTHER): Payer: Medicare Other | Admitting: Internal Medicine

## 2011-12-09 ENCOUNTER — Telehealth: Payer: Self-pay | Admitting: *Deleted

## 2011-12-09 ENCOUNTER — Encounter: Payer: Self-pay | Admitting: Internal Medicine

## 2011-12-09 VITALS — BP 139/88 | HR 70 | Temp 97.9°F | Ht 71.0 in | Wt 311.1 lb

## 2011-12-09 DIAGNOSIS — E119 Type 2 diabetes mellitus without complications: Secondary | ICD-10-CM

## 2011-12-09 DIAGNOSIS — I10 Essential (primary) hypertension: Secondary | ICD-10-CM | POA: Diagnosis not present

## 2011-12-09 LAB — GLUCOSE, CAPILLARY: Glucose-Capillary: 90 mg/dL (ref 70–99)

## 2011-12-09 NOTE — Assessment & Plan Note (Signed)
Blood pressure is relatively well-controlled today. She is not on an ACE inhibitor which would be beneficial as she has diabetes. Her hypertension and kidney transplant are currently managed by her nephrologist, who she sees monthly. We will defer to their excellent management.

## 2011-12-09 NOTE — Progress Notes (Addendum)
Subjective:     Patient ID: [name], female   DOB: 1966-03-26, 46 y.o.   MRN: [id number]  Diabetes She presents for her follow-up diabetic visit. She has type 2 diabetes mellitus. The initial diagnosis of diabetes was made 1 month ago. Her disease course has been improving. There are no hypoglycemic associated symptoms. Pertinent negatives for hypoglycemia include no confusion, dizziness, headaches, hunger, mood changes, nervousness/anxiousness, pallor, seizures, sleepiness, speech difficulty, sweats or tremors. Associated symptoms include blurred vision, fatigue and visual change. Pertinent negatives for diabetes include no chest pain, no foot paresthesias, no foot ulcerations, no polydipsia, no polyphagia, no polyuria, no weakness and no weight loss. (She did see the eye doctor for a diabetes eye exam. She states her vision is 20/20 but she does have blurry vision occasionally.) There are no hypoglycemic complications. Pertinent negatives for hypoglycemia complications include no blackouts. Symptoms are improving. Pertinent negatives for diabetic complications include no retinopathy. Risk factors for coronary artery disease include diabetes mellitus and hypertension. Current diabetic treatment includes insulin injections. She is compliant with treatment all of the time. She is currently taking insulin pre-breakfast, pre-lunch, pre-dinner and at bedtime. Her weight is stable. Her home blood glucose trend is decreasing steadily. (Unfortunately she had an issue with her glucometer and has been unable to get strips. She has been using meter of her mother's. However the date and time was off on this meter. In general her lowest reading over the last week is 123. She tells me her lowest ever blood glucose was 90. Her highest over the past 2 weeks was in the mid 200s.) An ACE inhibitor/angiotensin II receptor blocker is not being taken (Kidney and HTN is managed by nephrology.). She does not see a podiatrist  (foot exams done in clinic).Eye exam is current.   she tells me that she will get her glucose strips for her current meter soon. Her pharmacy told her recently that they are just pending Medicaid approval which should be shortly.    When asked if she has had any episodes of hypoglycemia, she tells me that she is unsure of what the symptoms of hypoglycemia are.     Review of Systems  Constitutional: Positive for fatigue. Negative for weight loss.  Eyes: Positive for blurred vision.  Respiratory: Negative for shortness of breath.   Cardiovascular: Negative for chest pain, palpitations and leg swelling.  Genitourinary: Negative for polyuria.  Skin: Negative for pallor, rash and wound.  Neurological: Negative for dizziness, tremors, seizures, speech difficulty, weakness and headaches.  Hematological: Negative for polydipsia and polyphagia.  Psychiatric/Behavioral: Negative for confusion. The patient is not nervous/anxious.        Objective:   Physical Exam  Vitals reviewed. Constitutional: She appears well-developed. No distress.  HENT:  Head: Normocephalic.  Neck: Neck supple. No JVD present.  Cardiovascular: Normal rate, regular rhythm and normal heart sounds.   Pulmonary/Chest: Effort normal and breath sounds normal.  Skin: Skin is warm and dry. No rash noted. She is not diaphoretic. No pallor.  Psychiatric: She has a normal mood and affect. Her behavior is normal.   Lab Results  Component Value Date   CHOL 156 11/19/2011   HDL 45 11/19/2011   LDLCALC 84 11/19/2011   TRIG 135 11/19/2011   CHOLHDL 3.5 11/19/2011       Assessment:     Please see problem oriented charting for assessment and plan by problem (best viewed under encounters tab).

## 2011-12-09 NOTE — Patient Instructions (Signed)
Please continue your excellent diabetes management.    At checkout please be sure to make an appointment to see [facility] within 1-2 weeks. She will help you to sort out your insulin test strip issue.  Return to clinic in one month to see her primary care provider Dr. Victorio Palm.     Hypoglycemia (Low Blood Sugar) Hypoglycemia is when the glucose (sugar) in your blood is too low. Hypoglycemia can happen for many reasons. It can happen to people with or without diabetes. Hypoglycemia can develop quickly and can be a medical emergency.  CAUSES  Having hypoglycemia does not mean that you will develop diabetes. Different causes include:  Missed or delayed meals or not enough carbohydrates eaten.   Medication overdose. This could be by accident or deliberate. If by accident, your medication may need to be adjusted or changed.   Exercise or increased activity without adjustments in carbohydrates or medications.   A nerve disorder that affects body functions like your heart rate, blood pressure and digestion (autonomic neuropathy).   A condition where the stomach muscles do not function properly (gastroparesis). Therefore, medications may not absorb properly.   The inability to recognize the signs of hypoglycemia (hypoglycemic unawareness).   Absorption of insulin - may be altered.   Alcohol consumption.   Pregnancy/menstrual cycles/postpartum. This may be due to hormones.   Certain kinds of tumors. This is very rare.  SYMPTOMS   Sweating.   Hunger.   Dizziness.   Blurred vision.   Drowsiness.   Weakness.   Headache.   Rapid heart beat.   Shakiness.   Nervousness.  DIAGNOSIS  Diagnosis is made by monitoring blood glucose in one or all of the following ways:  Fingerstick blood glucose monitoring.   Laboratory results.  TREATMENT  If you think your blood glucose is low:  Check your blood glucose, if possible. If it is less than 70 mg/dl, take one of the  following:   3-4 glucose tablets.    cup juice (prefer clear like apple).    cup "regular" soda pop.   1 cup milk.   -1 tube of glucose gel.   5-6 hard candies.   Do not over treat because your blood glucose (sugar) will only go too high.   Wait 15 minutes and recheck your blood glucose. If it is still less than 70 mg/dl (or below your target range), repeat treatment.   Eat a snack if it is more than one hour until your next meal.  Sometimes, your blood glucose may go so low that you are unable to treat yourself. You may need someone to help you. You may even pass out or be unable to swallow. This may require you to get an injection of glucagon, which raises the blood glucose. HOME CARE INSTRUCTIONS  Check blood glucose as recommended by your caregiver.   Take medication as prescribed by your caregiver.   Follow your meal plan. Do not skip meals. Eat on time.   If you are going to drink alcohol, drink it only with meals.   Check your blood glucose before driving.   Check your blood glucose before and after exercise. If you exercise longer or different than usual, be sure to check blood glucose more frequently.   Always carry treatment with you. Glucose tablets are the easiest to carry.   Always wear medical alert jewelry or carry some form of identification that states that you have diabetes. This will alert people that you have diabetes.  If you have hypoglycemia, they will have a better idea on what to do.  SEEK MEDICAL CARE IF:   You are having problems keeping your blood sugar at target range.   You are having frequent episodes of hypoglycemia.   You feel you might be having side effects from your medicines.   You have symptoms of an illness that is not improving after 3-4 days.   You notice a change in vision or a new problem with your vision.  SEEK IMMEDIATE MEDICAL CARE IF:   You are a family member or friend of a person whose blood glucose goes below 70  mg/dl and is accompanied by:   Confusion.   A change in mental status.   The inability to swallow.   Passing out.  Document Released: 06/24/2005 Document Revised: 06/13/2011 Document Reviewed: 02/16/2009 [facility] Patient Information 2012 [place].

## 2011-12-09 NOTE — Assessment & Plan Note (Addendum)
[name] has done very well checking her blood glucose. She is checking approximately 4 times a day 3 times a day a.c. and each bedtime. Her blood glucose has ranged between the 90s to mid 200s with the majority of the readings being in the high 100s. I applauded her on her excellent management of her diabetes. Given that she has had lows approximately to the 90s to 120s daily over the past 5-6 days, we will not alter her basal insulin dose. I asked her to continue her current excellent management and followup with her PCP in 1 month. I will also have [name] followup with Debera Lat in our clinic in the next 1-2 weeks for further diabetes teaching and to ensure that she gets her glucometer strips from Medicaid. I asked her to take very good measurements of her blood glucose in the week prior to her visit with Butch Penny so that, we'll be able to provide good recommendations to her. We reviewed the symptoms of hypoglycemia and diabetic foot exam was performed today. Patient does not have hyper cholesterolemia. No statin is necessary at this time.

## 2011-12-13 NOTE — Telephone Encounter (Signed)
Opened in error

## 2011-12-17 ENCOUNTER — Ambulatory Visit (INDEPENDENT_AMBULATORY_CARE_PROVIDER_SITE_OTHER): Payer: Medicare Other | Admitting: Dietician

## 2011-12-17 DIAGNOSIS — E119 Type 2 diabetes mellitus without complications: Secondary | ICD-10-CM

## 2011-12-19 DIAGNOSIS — N179 Acute kidney failure, unspecified: Secondary | ICD-10-CM | POA: Diagnosis not present

## 2011-12-19 DIAGNOSIS — I10 Essential (primary) hypertension: Secondary | ICD-10-CM | POA: Diagnosis not present

## 2011-12-19 DIAGNOSIS — N2581 Secondary hyperparathyroidism of renal origin: Secondary | ICD-10-CM | POA: Diagnosis not present

## 2011-12-19 DIAGNOSIS — D509 Iron deficiency anemia, unspecified: Secondary | ICD-10-CM | POA: Diagnosis not present

## 2011-12-20 ENCOUNTER — Other Ambulatory Visit: Payer: Self-pay | Admitting: Dietician

## 2011-12-20 DIAGNOSIS — E119 Type 2 diabetes mellitus without complications: Secondary | ICD-10-CM

## 2011-12-20 MED ORDER — ACCU-CHEK NANO SMARTVIEW W/DEVICE KIT: 1.0000 | PACK | Freq: Four times a day (QID) | Status: DC

## 2011-12-20 NOTE — Telephone Encounter (Signed)
  Called patient to discuss Novolog insulin doses: patient has been taking Novolog 4x/day ( 8-12-4 and 8 PM and lantus  ~9 PM)  and instructions are to take 13 units 3x/day with meals. Patient will take the 13 units 3x/day before her 8 am, 12 pm and 4 pm meals. Discussed she should have snack at bedtime if CBG < 110mg /dl but otherwise a snack is optional.  Patient reports no problems or questions at this time.   Patient needs prescription for self monitoring supplies, previous orders are not on formulary for pt. - will [facility] Rx [facility] to [facility] on [address] for 4x/day checks before meals and bedtime.

## 2011-12-20 NOTE — Progress Notes (Signed)
Medical Nutrition Therapy:  Appt start time: 0936 end time:  1030.  Assessment:  Primary concerns today: Blood sugar control and Meal planning.  Pt with newly dx diabetes.s/p kidney transplant 2011. Is following meal plan, taking medicine as prescribed,  checking blood sugars, did not bring meter today. Reports most blood sugars 90-181, CBg was 77 in office ~1.5 hours after breakfast.  Usual eating pattern includes 3 meals and 1 snack per day. Everyday foods include water.  Avoided foods include soda.   24-hr recall: B (8 AM)-  2 carbs- 1 pack regular instant oatmeal and fruit L (12 PM)-3-6 carbs: sometimes frozen meal other days soup, sandwich, fruit and 8 oz lemonade-sugar sweetened D (4 PM)- 3-5 carbs:  "biggest meal with 2 veggies and frozen meal "alfredo" Sk (8 PM)-1-2 carbs small blueberry muffin or tortilla chips x 13  Usual physical activity includes ADLs and some walking.  Progress Towards Goal(s):  In progress.   Nutritional Diagnosis:  NB-1.1 Food and nutrition-related knowledge deficit As related to lack of previous exposure to nutrition for diabetes and new dx.  As evidenced by patient report and questions.    Intervention:  1- provided patient with Accu check nano meter today and 10 strips- needs Rx for strips and lancets 2-  Nutrition education about basic carb counting emphasizing consistency.  Monitoring/Evaluation:  Dietary intake, exercise, blood sugars, and body weight in 4 week(s).

## 2011-12-22 MED ORDER — GLUCOSE BLOOD VI STRP: ORAL_STRIP | Status: DC

## 2011-12-22 MED ORDER — ACCU-CHEK FASTCLIX LANCETS MISC: 1.0000 | Freq: Four times a day (QID) | Status: DC

## 2011-12-27 ENCOUNTER — Telehealth: Payer: Self-pay | Admitting: *Deleted

## 2011-12-27 DIAGNOSIS — E119 Type 2 diabetes mellitus without complications: Secondary | ICD-10-CM

## 2011-12-27 MED ORDER — INSULIN ASPART 100 UNIT/ML ~~LOC~~ SOLN: 13.0000 [IU] | Freq: Three times a day (TID) | SUBCUTANEOUS | Status: DC

## 2011-12-27 NOTE — Telephone Encounter (Signed)
I ordered Novolog flexpen, but I can only do levemir, not lantus as a flex pen? How do I know if patient's insurance will cover that long acting insulin?  Or am I over-seeing the lantus flex pen?

## 2011-12-27 NOTE — Telephone Encounter (Signed)
Pt uses flexpen for both insulin - Novolog and Lantus. Talked with D. Plyler - will not change dosage of insulin - just how it is administered. Pharmacy - [facility] [phone] ok to use flexpen for insulins. Hilda Blades Shawna Kiener RN 12/27/11 11:30AM

## 2011-12-30 NOTE — Addendum Note (Signed)
Addended by: Resa Miner on: 12/30/2011 10:09 AM   Modules accepted: Orders

## 2011-12-30 NOTE — Telephone Encounter (Signed)
D. Plyler helped enter Lantus solorstar order.

## 2011-12-30 NOTE — Telephone Encounter (Signed)
Assisting Dr. Victorio Palm and triage with entering Lantus solostar insulin pen.

## 2011-12-31 ENCOUNTER — Ambulatory Visit (INDEPENDENT_AMBULATORY_CARE_PROVIDER_SITE_OTHER): Payer: Medicare Other | Admitting: Dietician

## 2011-12-31 ENCOUNTER — Encounter: Payer: Self-pay | Admitting: Dietician

## 2011-12-31 VITALS — Ht 71.0 in | Wt 317.9 lb

## 2011-12-31 DIAGNOSIS — E119 Type 2 diabetes mellitus without complications: Secondary | ICD-10-CM | POA: Diagnosis not present

## 2011-12-31 NOTE — Progress Notes (Signed)
Medical Nutrition Therapy:  Appt start time: 0930 end time:  1015.  Assessment:  Primary concerns today: Blood sugar control and Meal planning.   Is trying to follow meal plan of 40-60 grams carb, taking medicine as prescribed,  checking blood sugars. Is having difficulty obtaining supplies for self monitoring. 3x/day sometimes 4.  Usual eating pattern includes 3 meals and 1-2 snack per day. Everyday foods include water.  Avoided foods include soda.   Logbook review showed most blood sugars in target of < 130 premeal, 5 at breakfast, lunch > 130 and 10 at dinner >130, 1/3 at bedtime > 130. Reviewed carbs at lunch intake ~ 80-90 grams. Patient identified how to decrease carbs on her own "by eating more free foods, veggies and protein and smaller portions of carbs.  Usual physical activity includes ADLs and some walking.  Progress Towards Goal(s):  In progress.   Nutritional Diagnosis:  NB-1.1 Food and nutrition-related knowledge deficit As related to lack of previous exposure to nutrition for diabetes improving.    Intervention:  1- Log book reviewed with patient. 2-  Nutrition review/education about basic carb counting emphasizing consistency.  3- Nutrition education regarding target premeal blood sugar and meal substitutions to achieve these targets. Monitoring/Evaluation:  Dietary intake, exercise, blood sugars, and body weight in 4-6 week(s).  Addendum: called [facility]; patient needed a Certificate of Medical Necessity(CMN) for > 3x/day self monitoring. CMN was completed and faxed to [facility] department and the local [facility] on [address].

## 2011-12-31 NOTE — Patient Instructions (Signed)
Continue to check blood sugar 3- 4 times a day before meals & bedtime.  Try to keep carbs between 40 and 60 grams for meals and less than 15 grams carb for snacks.   Limit fat intake in cooking and dairy foods as  Much as possible to help with weight and also try to walk 10-15 minutes after meals (for a total of 30-45 minutes each day) .  You are doing a fantastic job at carb counting!!!

## 2012-01-22 ENCOUNTER — Encounter: Payer: Self-pay | Admitting: Internal Medicine

## 2012-01-22 ENCOUNTER — Ambulatory Visit (INDEPENDENT_AMBULATORY_CARE_PROVIDER_SITE_OTHER): Payer: Medicare Other | Admitting: Dietician

## 2012-01-22 ENCOUNTER — Encounter: Payer: Self-pay | Admitting: Dietician

## 2012-01-22 ENCOUNTER — Ambulatory Visit (INDEPENDENT_AMBULATORY_CARE_PROVIDER_SITE_OTHER): Payer: Medicare Other | Admitting: Internal Medicine

## 2012-01-22 VITALS — BP 135/82 | HR 97 | Temp 97.1°F | Wt 315.6 lb

## 2012-01-22 DIAGNOSIS — I10 Essential (primary) hypertension: Secondary | ICD-10-CM

## 2012-01-22 DIAGNOSIS — E119 Type 2 diabetes mellitus without complications: Secondary | ICD-10-CM

## 2012-01-22 LAB — POCT GLYCOSYLATED HEMOGLOBIN (HGB A1C): Hemoglobin A1C: 6.2

## 2012-01-22 MED ORDER — INSULIN GLARGINE 100 UNIT/ML ~~LOC~~ SOLN: 55.0000 [IU] | Freq: Every day | SUBCUTANEOUS | Status: DC

## 2012-01-22 NOTE — Assessment & Plan Note (Signed)
Blood pressure is well-controlled today - 135/82.  She reports that blood work was drawn most recently at Dr. Birdie Riddle office in June, and reports creatinine of 1.5. Lopressor is being managed by [facility]. She reports compliance with amlodipine, clonidine, and labetalol. I have requested records from his office, especially because we noted that a urinalysis in our system suggests hematuria versus myoglobinuria. We will repeat a urinalysis and urine microalbumin today.

## 2012-01-22 NOTE — Patient Instructions (Signed)
-  Please decrease Lantus to 55 units before bed. Please continue to check your blood sugars 3 times per day.  Please be sure to bring all of your medications with you to every visit.  Should you have any new or worsening symptoms, please be sure to call the clinic at [phone].

## 2012-01-22 NOTE — Patient Instructions (Addendum)
Mail carb practice to me.  Follow up 1-3 months.    Call anytime in between appointment for nay diabetes related issues.   Kinde, [place], [place]

## 2012-01-22 NOTE — Assessment & Plan Note (Signed)
Lab Results  Component Value Date   HGBA1C 6.2 01/22/2012   HGBA1C 10.0* 11/03/2011   CREATININE 1.61* 11/08/2011   CHOL 156 11/19/2011   HDL 45 11/19/2011   TRIG 135 11/19/2011    Last [facility] exam :  [facility].  Assessment: Diabetes control: controlled Progress toward goals: at goal Barriers to meeting goals: no barriers identified  Plan: Diabetes treatment: Continue mealtime insulin at 13 units 3 times a day with meals, but increase Lantus to 55 units before bed given patient is symptomatic with a blood sugar in the 80s to 90s., We'll monitor A1c in 3 months Refer to: none Instruction/counseling given: reminded to bring blood glucose meter & log to each visit, reminded to bring medications to each visit, discussed the need for weight loss and discussed diet

## 2012-01-22 NOTE — Progress Notes (Signed)
Medical Nutrition Therapy:  Appt start time: [id number] end time:  [id number].  Assessment:  Primary concerns today: Blood sugar control and Meal planning. Lantus decreased to 55 units a night today.  Is following meal plan of 40-60 grams carb each meal trying to eat snacks with 15 grams carb, taking medicine as prescribed,  checking blood sugars. Meter download shows most values in target. Higher pre dinner- patient reports this mealtime varies nad when she eats later she snacks beforehand. Patient identified a 60 grams carb meal and 15 grams carb snack.  Usual physical activity includes ADLs and some walking.  Progress Towards Goal(s):  In progress.   Nutritional Diagnosis:  NB-1.1 Food and nutrition-related knowledge deficit As related to lack of previous exposure to nutrition for diabetes almost resolved    Intervention:  1- Meter download reviewed with patient. 2-  Nutrition review/education about basic carb counting emphasizing consistency.  3- Nutrition education regarding target premeal blood sugar and meal substitutions to achieve these targets. 4- practice carb worksheet given to patient to complete and mail back in,  Monitoring/Evaluation:  Dietary intake, exercise, blood sugars, and body weight in 1-3 months(s).

## 2012-01-22 NOTE — Progress Notes (Signed)
  Subjective:   Patient ID: [name] female   DOB: Jul 12, 1965 46 y.o.   MRN: [id number]  HPI: [name] is a 46 y.o. woman with history of renal transplant, hypertension, and diabetes. She is here for followup regarding her diabetes, which was diagnosed in May 2013, likely related to both her weight and tacrolimus.  DM: She checks her blood sugars about 3 times per day, and notes that they ranged from 110-140, with occasional dips into the 80s or 90s, when her blood sugar dips, she does feel like her head is cloudy, some sweating, and notes that this occurs one to 2 times per week. She denies symptoms of hyperglycemia such as polyuria, polydipsia, or polyphagia. She tries to exercise walking around [place] 2-3 times per week. She continues to be compliant with aspart insulin 13 units 3 times a day with meals and Lantus 60 units before bed.  She has tried to improve her diet by cutting out fatty foods and desserts. She has met with Debera Lat as recent as 12/31/2011.  She has gotten and I examined the last few months at the [facility]. She continues to smoke 10 cigarettes per week.   Past Medical History  Diagnosis Date  . 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    [facility]  . Hypertension   . Diabetes mellitus November 03, 2011    possibly immunosuppresent induced  . GERD (gastroesophageal reflux disease)     medication induced   Review of Systems: Constitutional: Denies fever, chills, diaphoresis, appetite change and fatigue.  HEENT: Denies photophobia, eye pain, redness, hearing loss, ear pain, congestion, sore throat, rhinorrhea, sneezing, mouth sores, trouble swallowing, neck pain, neck stiffness and tinnitus.   Respiratory: Denies SOB, DOE, cough, chest tightness,  and wheezing.   Cardiovascular: Denies chest pain, palpitations and leg swelling.  Gastrointestinal: Denies nausea, vomiting, abdominal pain, diarrhea, constipation, blood in stool and  abdominal distention.  Genitourinary: Denies dysuria, urgency, frequency, hematuria, flank pain and difficulty urinating.  Musculoskeletal: Denies myalgias, back pain, joint swelling, arthralgias and gait problem.  Skin: Denies pallor, rash and wound.  Neurological: Denies dizziness, seizures, syncope, weakness, light-headedness, numbness and headaches.  Psychiatric/Behavioral: Denies suicidal ideation, mood changes, confusion, nervousness, sleep disturbance and agitation  Objective:  Physical Exam: Filed Vitals:   01/22/12 1327  BP: 135/82  Pulse: 97  Temp: 97.1 F (36.2 C)  TempSrc: Oral  Weight: 315 lb 9.6 oz (143.155 kg)   Constitutional: Vital signs reviewed.  Patient is an obese woman in no acute distress and cooperative with exam.  Mouth: no erythema or exudates, MMM, poor dentition Eyes: PERRL, EOMI, conjunctivae normal, No scleral icterus.  Neck: Supple, Trachea midline normal ROM, No JVD, mass, thyromegaly, or carotid bruit present.  Cardiovascular: RRR, S1 normal, S2 normal, no MRG, pulses symmetric and intact bilaterally Pulmonary/Chest: CTAB, no wheezes, rales, or rhonchi Abdominal: Soft. Non-tender, non-distended, bowel sounds are normal, no masses, organomegaly, or guarding present.  Neurological: A&O x3, Strength is normal and symmetric bilaterally, cranial nerve II-XII are grossly intact, no focal motor deficit, sensory intact to light touch bilaterally.  Skin: Warm, dry and intact. No rash, cyanosis, or clubbing.  Psychiatric: Normal mood and affect. speech and behavior is normal. Judgment and thought content normal. Cognition and memory are normal.   Assessment & Plan:   Case and care discussed Dr. Stann Mainland. Patient to return in 3 months for diabetes followup. Please see problem oriented charting for further details.

## 2012-01-23 LAB — MICROALBUMIN / CREATININE URINE RATIO
Creatinine, Urine: 213.6 mg/dL
Microalb Creat Ratio: 20.1 mg/g (ref 0.0–30.0)

## 2012-01-23 LAB — URINALYSIS, ROUTINE W REFLEX MICROSCOPIC
Bilirubin Urine: NEGATIVE
Ketones, ur: NEGATIVE mg/dL
Specific Gravity, Urine: 1.024 (ref 1.005–1.030)
pH: 5 (ref 5.0–8.0)

## 2012-02-09 ENCOUNTER — Encounter: Payer: Self-pay | Admitting: Internal Medicine

## 2012-02-27 ENCOUNTER — Telehealth: Payer: Self-pay | Admitting: Dietician

## 2012-02-27 NOTE — Telephone Encounter (Signed)
Acknowledged return of carb practice. Patient reports she has been studying her carb book and found practice helpful.

## 2012-04-02 DIAGNOSIS — I10 Essential (primary) hypertension: Secondary | ICD-10-CM | POA: Diagnosis not present

## 2012-04-02 DIAGNOSIS — N2581 Secondary hyperparathyroidism of renal origin: Secondary | ICD-10-CM | POA: Diagnosis not present

## 2012-04-02 DIAGNOSIS — Z940 Kidney transplant status: Secondary | ICD-10-CM | POA: Diagnosis not present

## 2012-04-02 DIAGNOSIS — Z23 Encounter for immunization: Secondary | ICD-10-CM | POA: Diagnosis not present

## 2012-04-02 DIAGNOSIS — R413 Other amnesia: Secondary | ICD-10-CM | POA: Diagnosis not present

## 2012-04-02 DIAGNOSIS — D509 Iron deficiency anemia, unspecified: Secondary | ICD-10-CM | POA: Diagnosis not present

## 2012-04-02 DIAGNOSIS — E119 Type 2 diabetes mellitus without complications: Secondary | ICD-10-CM | POA: Diagnosis not present

## 2012-04-20 DIAGNOSIS — D509 Iron deficiency anemia, unspecified: Secondary | ICD-10-CM | POA: Diagnosis not present

## 2012-04-20 DIAGNOSIS — Z79899 Other long term (current) drug therapy: Secondary | ICD-10-CM | POA: Diagnosis not present

## 2012-04-20 DIAGNOSIS — N2581 Secondary hyperparathyroidism of renal origin: Secondary | ICD-10-CM | POA: Diagnosis not present

## 2012-04-20 DIAGNOSIS — Z1231 Encounter for screening mammogram for malignant neoplasm of breast: Secondary | ICD-10-CM | POA: Diagnosis not present

## 2012-04-20 DIAGNOSIS — Z940 Kidney transplant status: Secondary | ICD-10-CM | POA: Diagnosis not present

## 2012-04-20 DIAGNOSIS — E785 Hyperlipidemia, unspecified: Secondary | ICD-10-CM | POA: Diagnosis not present

## 2012-04-20 DIAGNOSIS — D649 Anemia, unspecified: Secondary | ICD-10-CM | POA: Diagnosis not present

## 2012-04-21 DIAGNOSIS — R413 Other amnesia: Secondary | ICD-10-CM | POA: Diagnosis not present

## 2012-04-22 ENCOUNTER — Other Ambulatory Visit: Payer: Self-pay | Admitting: Diagnostic Neuroimaging

## 2012-04-22 DIAGNOSIS — R413 Other amnesia: Secondary | ICD-10-CM

## 2012-04-30 ENCOUNTER — Ambulatory Visit (INDEPENDENT_AMBULATORY_CARE_PROVIDER_SITE_OTHER): Payer: Medicare Other | Admitting: Internal Medicine

## 2012-04-30 ENCOUNTER — Encounter: Payer: Self-pay | Admitting: Internal Medicine

## 2012-04-30 ENCOUNTER — Ambulatory Visit (INDEPENDENT_AMBULATORY_CARE_PROVIDER_SITE_OTHER): Payer: Medicare Other | Admitting: Dietician

## 2012-04-30 VITALS — BP 140/70 | HR 64 | Temp 97.8°F | Ht 71.0 in | Wt 331.8 lb

## 2012-04-30 DIAGNOSIS — Z940 Kidney transplant status: Secondary | ICD-10-CM | POA: Diagnosis not present

## 2012-04-30 DIAGNOSIS — E119 Type 2 diabetes mellitus without complications: Secondary | ICD-10-CM

## 2012-04-30 DIAGNOSIS — I10 Essential (primary) hypertension: Secondary | ICD-10-CM | POA: Diagnosis not present

## 2012-04-30 DIAGNOSIS — F17200 Nicotine dependence, unspecified, uncomplicated: Secondary | ICD-10-CM | POA: Diagnosis not present

## 2012-04-30 DIAGNOSIS — R5381 Other malaise: Secondary | ICD-10-CM | POA: Diagnosis not present

## 2012-04-30 DIAGNOSIS — R5383 Other fatigue: Secondary | ICD-10-CM

## 2012-04-30 DIAGNOSIS — R635 Abnormal weight gain: Secondary | ICD-10-CM

## 2012-04-30 DIAGNOSIS — Z720 Tobacco use: Secondary | ICD-10-CM | POA: Insufficient documentation

## 2012-04-30 LAB — GLUCOSE, CAPILLARY: Glucose-Capillary: 169 mg/dL — ABNORMAL HIGH (ref 70–99)

## 2012-04-30 NOTE — Progress Notes (Signed)
Medical Nutrition Therapy:  Appt start time: [id number] end time:  [id number].  Assessment:  Primary concerns today: Blood sugar control and Meal planning. Takes Lantus 55 units at night, 13 units Novolog with each meal. Weight increased 16.2# in past 3 months. Reports at least 2 hypoglycemic episodes after breakfast, several more than that were not recorded- she just treated. Upon review of food eaten for breakfast- her carb intake varies between ~30 grams and 70 grams.  Meter download shows most values in target. Usual physical activity includes ADLs and some walking.  Progress Towards Goal(s):  In progress.   Nutritional Diagnosis:  NB-1.1 Food and nutrition-related knowledge deficit As related to lack of previous exposure to nutrition for diabetes almost resolved    Intervention:  1- Meter download reviewed with patient showed her how to use meal markers in meter and 2 hour post meal timer.  2-  Nutrition review/education about carb counting emphasizing consistency.  3- Nutrition education regarding rationale for post meal checks,  target premeal and post meal blood sugars.[name] 4-Plan is for patient to check post meal breakfast at least 3 times in next few weeks and try to eat more consistently for breakfast  Monitoring/Evaluation:  Dietary intake, exercise, blood sugars, and body weight in 3 months(s).

## 2012-04-30 NOTE — Patient Instructions (Addendum)
-  No changes to your medications today  -Lets work on getting you over this fatigue hump - try to exercise everyday and work on sleep hygiene (no more daytime naps!)  Please be sure to bring all of your medications with you to every visit.  Should you have any new or worsening symptoms, please be sure to call the clinic at [phone].

## 2012-04-30 NOTE — Assessment & Plan Note (Signed)
Lab Results  Component Value Date   HGBA1C 6.2 01/22/2012   HGBA1C 10.0* 11/03/2011   CREATININE 1.61* 11/08/2011   MICROALBUR 4.29* 01/22/2012   MICRALBCREAT 20.1 01/22/2012   CHOL 156 11/19/2011   HDL 45 11/19/2011   TRIG 135 11/19/2011     Assessment: Diabetes control: controlled Progress toward goals: at goal Barriers to meeting goals: lack of understanding of disease management  Plan: Diabetes treatment: No changes - continue lantus 55u qHS and novolog 13 TID WC Refer to: Meeting with dietician today Instruction/counseling given: reminded to bring blood glucose meter & log to each visit and reminded to bring medications to each visit

## 2012-04-30 NOTE — Assessment & Plan Note (Signed)
Lab Results  Component Value Date   NA 143 11/08/2011   K 3.7 11/08/2011   CL 116* 11/08/2011   CO2 19 11/08/2011   BUN 29* 11/08/2011   CREATININE 1.61* 11/08/2011  -More recent labs in system from [place] [facility]  BP Readings from Last 3 Encounters:  04/30/12 140/70  01/22/12 135/82  12/09/11 139/88    Assessment: Hypertension control:  mildly elevated  Progress toward goals:  unchanged Barriers to meeting goals:  no barriers identified  Plan: Hypertension treatment:  continue current medications

## 2012-04-30 NOTE — Assessment & Plan Note (Signed)
Continues to be  Compliant with prograf & myfortic.  Followed by Dr. Arty Baumgartner.

## 2012-04-30 NOTE — Progress Notes (Signed)
Subjective:   Patient ID: [name] female   DOB: 1966/04/02 46 y.o.   MRN: [id number]  HPI: [name] is a 45 y.o. woman with history of renal transplant, hypertension, and diabetes.She is here for followup regarding her diabetes, which was diagnosed in May 2013, likely related to both her weight and tacrolimus.  DM: She checks her blood sugars about 3 times per day, and notes that they ranged from 73-245, with occasional lows, when her blood sugar dips, she does feel like her head is cloudy, some sweating. She denies symptoms of hyperglycemia such as polyuria, polydipsia, or polyphagia. She has recently become increasingly fatigued and subsequently gained weight d/t lack of exercise.  She continues to be compliant with aspart insulin 13 units 3 times a day with meals and Lantus 55 units before bed. She has tried to improve her diet by cutting out fatty foods and desserts. She will meet with Debera Lat today.  Reports increased forgetfulness - MRI tomorrow ordered by nephrologist - forgot appts, worried that she is going to forget meds  Her menstrual cycles have become more Irreg cycles; She has been tested for OSA in the past, and was negative per her report.  She does report AM headaches, but lacks waking up short of breath or daytime somnolence  Increased fatigue - stays in bed the whole day, can't walk the way she used to, feels lazy, no change in appetite, no exercise, constipated (BM sporadic, daily to twice a day, but sometimes a few days apart).  Has prn order for miralax; noticed for the last few months; no temp dysregulation;   Continues to smoke  Past Medical History  Diagnosis Date  . 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 Hypertension   . 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 GERD (gastroesophageal reflux disease)     medication induced   Current Outpatient Prescriptions  Medication Sig Dispense Refill  . ACCU-CHEK FASTCLIX LANCETS MISC 1 each by Does not apply route 4 (four) times daily. Dx code- 250.00  204 each  12  . amLODipine (NORVASC) 10 MG tablet Take 10 mg by mouth daily.      . Blood Glucose Monitoring Suppl (ACCU-CHEK NANO SMARTVIEW) W/DEVICE KIT 1 each by Does not apply route 4 (four) times daily.  1 kit  0  . cloNIDine (CATAPRES) 0.2 MG tablet Take 0.2 mg by mouth 3 (three) times daily.      [name] glucose blood (ACCU-CHEK SMARTVIEW) test strip 4x/day before meals and bedtime.  Dx code 250.00  150 each  12  . glucose monitoring kit (FREESTYLE) monitoring kit 1 each by Does not apply route as needed for other. For QID testing- test strips and lancets  1 each  0  . insulin aspart (NOVOLOG FLEXPEN) 100 UNIT/ML injection Inject 13 Units into the skin 3 (three) times daily before meals.  1 vial  12  . insulin glargine (LANTUS) 100 UNIT/ML injection Inject 55 Units into the skin at bedtime.  10 mL  3  . labetalol (NORMODYNE) 200 MG tablet Take 200 mg by mouth 3 (three) times daily.      . mycophenolate (MYFORTIC) 180 MG EC tablet Take 720 mg by mouth  2 (two) times daily.      [name] omeprazole (PRILOSEC) 20 MG capsule Take 20 mg by mouth daily.      . predniSONE (DELTASONE) 5 MG tablet Take 5 mg by mouth daily.      . tacrolimus (PROGRAF) 1 MG capsule Take 9 mg by mouth 2 (two) times daily.       Family History  Problem Relation Age of Onset  . Hypertension Mother   . Diabetes Mother   . Atrial fibrillation Father   . Hypertension Father   . Arthritis Father   . Heart failure Sister   . Hypertension Sister     all 4 sisters  . Hypertension Brother     both brothers  . Diabetes Brother    History   Social History  . Marital Status: Single    Spouse Name: N/A    Number of Children: N/A    . Years of Education: N/A   Social History Main Topics  . Smoking status: Current Every Day Smoker -- 0.3 packs/day    Types: Cigarettes  . Smokeless tobacco: None   Comment:   Smoking 1 pack every 3 days- 6.25.13. encouraged her to call quitline again and sign up for support calls.   . Alcohol Use: No  . Drug Use: No  . Sexually Active: No   Other Topics Concern  . 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  Respiratory: Denies SOB, DOE, cough, chest tightness, and wheezing.  Cardiovascular: Denies chest pain, palpitations and leg swelling.  Gastrointestinal: Denies nausea, vomiting, abdominal pain, diarrhea, constipation, blood in stool and abdominal distention.  Genitourinary: Denies dysuria, urgency, frequency, hematuria, flank pain and difficulty urinating.  Musculoskeletal: Denies myalgias, back pain, joint swelling, arthralgias and gait problem.  Skin: Denies pallor, rash and wound.  Neurological: Denies dizziness, seizures, syncope, weakness, light-headedness, numbness and headaches.  Psychiatric/Behavioral: Denies suicidal ideation, mood changes, confusion, nervousness, sleep disturbance and agitation   Objective:  Physical Exam: Filed Vitals:   04/30/12 1406 04/30/12 1427  BP: 179/92 140/70  Pulse: 64 64  Temp: 97.8 F (36.6 C)   TempSrc: Oral   Height: 5\' 11"  (1.803 m)   Weight: 331 lb 12.8 oz (150.503 kg)   SpO2: 99%    Constitutional:  Vital signs reviewed. Patient is an obese woman in no acute distress and cooperative with exam.  Mouth: no erythema or exudates, MMM, poor dentition  Eyes: PERRL, EOMI, conjunctivae normal, No scleral icterus.  Neck: No thyromegaly Cardiovascular: RRR, S1 normal, S2 normal, no MRG, pulses symmetric and intact bilaterally  Pulmonary/Chest: CTAB, no wheezes, rales, or rhonchi  Abdominal: Soft. Non-tender, non-distended, bowel sounds are normal, no masses, organomegaly, or guarding present.  Neurological: A&O x3, Strength is normal and symmetric bilaterally, cranial nerve II-XII are grossly intact, no focal motor deficit, sensory intact to light touch bilaterally.  Skin: Warm, dry and intact. No rash, cyanosis, or clubbing.  Psychiatric: Normal mood and affect. speech and behavior is normal. Judgment and thought content normal. Cognition and  memory are normal.     Assessment & Plan:   Case and care discussed with Dr. Lynnae January Please see problem oriented charting for further details. Patient to return in 3 months for DM/HTN follow up.

## 2012-04-30 NOTE — Assessment & Plan Note (Signed)
Counseled on the importance of smoking cessation.

## 2012-04-30 NOTE — Assessment & Plan Note (Addendum)
Most recent TSH wnl.  Steroid insuff unlikely, she is on chronic prednisone therapy.  May be related to deconditioning.  Difficult to tell if fatigue causing weight gain or weight gain causing fatigue. No recent illness.  Related to menstrual changes?   -Trial of exercise and diet modifications, improve sleep hygiene

## 2012-05-01 ENCOUNTER — Ambulatory Visit
Admission: RE | Admit: 2012-05-01 | Discharge: 2012-05-01 | Disposition: A | Discharge status: Home / Self Care | Payer: Medicare Other | Source: Ambulatory Visit | Attending: Diagnostic Neuroimaging | Admitting: Diagnostic Neuroimaging

## 2012-05-01 DIAGNOSIS — R413 Other amnesia: Secondary | ICD-10-CM | POA: Diagnosis not present

## 2012-05-01 MED ORDER — GADOBENATE DIMEGLUMINE 529 MG/ML IV SOLN
20.0000 mL | Freq: Once | INTRAVENOUS | Status: AC | PRN
  Administered 2012-05-01: 20 mL via INTRAVENOUS

## 2012-05-18 DIAGNOSIS — Z940 Kidney transplant status: Secondary | ICD-10-CM | POA: Diagnosis not present

## 2012-05-18 DIAGNOSIS — E669 Obesity, unspecified: Secondary | ICD-10-CM | POA: Diagnosis not present

## 2012-05-18 DIAGNOSIS — Z87448 Personal history of other diseases of urinary system: Secondary | ICD-10-CM | POA: Diagnosis not present

## 2012-05-18 DIAGNOSIS — N186 End stage renal disease: Secondary | ICD-10-CM | POA: Diagnosis not present

## 2012-05-18 DIAGNOSIS — F17200 Nicotine dependence, unspecified, uncomplicated: Secondary | ICD-10-CM | POA: Diagnosis not present

## 2012-05-18 DIAGNOSIS — E119 Type 2 diabetes mellitus without complications: Secondary | ICD-10-CM | POA: Diagnosis not present

## 2012-05-18 DIAGNOSIS — I10 Essential (primary) hypertension: Secondary | ICD-10-CM | POA: Diagnosis not present

## 2012-05-18 DIAGNOSIS — Z794 Long term (current) use of insulin: Secondary | ICD-10-CM | POA: Diagnosis not present

## 2012-05-18 DIAGNOSIS — Z48298 Encounter for aftercare following other organ transplant: Secondary | ICD-10-CM | POA: Diagnosis not present

## 2012-05-18 DIAGNOSIS — I120 Hypertensive chronic kidney disease with stage 5 chronic kidney disease or end stage renal disease: Secondary | ICD-10-CM | POA: Diagnosis not present

## 2012-05-18 DIAGNOSIS — IMO0002 Reserved for concepts with insufficient information to code with codable children: Secondary | ICD-10-CM | POA: Diagnosis not present

## 2012-06-03 DIAGNOSIS — I10 Essential (primary) hypertension: Secondary | ICD-10-CM | POA: Diagnosis not present

## 2012-06-03 DIAGNOSIS — T8610 Unspecified complication of kidney transplant: Secondary | ICD-10-CM | POA: Diagnosis not present

## 2012-06-03 DIAGNOSIS — E119 Type 2 diabetes mellitus without complications: Secondary | ICD-10-CM | POA: Diagnosis not present

## 2012-06-03 DIAGNOSIS — Z940 Kidney transplant status: Secondary | ICD-10-CM | POA: Diagnosis not present

## 2012-07-20 ENCOUNTER — Encounter: Payer: Self-pay | Admitting: Internal Medicine

## 2012-07-22 DIAGNOSIS — R413 Other amnesia: Secondary | ICD-10-CM | POA: Diagnosis not present

## 2012-08-04 ENCOUNTER — Other Ambulatory Visit: Payer: Self-pay | Admitting: *Deleted

## 2012-08-04 DIAGNOSIS — E119 Type 2 diabetes mellitus without complications: Secondary | ICD-10-CM

## 2012-08-04 MED ORDER — GLUCOSE BLOOD VI STRP: ORAL_STRIP | Status: DC

## 2012-08-04 MED ORDER — GLUCOSE BLOOD VI STRP
ORAL_STRIP | Status: DC
Start: 2012-08-04 — End: 2012-08-04

## 2012-08-04 NOTE — Telephone Encounter (Signed)
Per [employer], Certificate of Medical Necessity form was completed which stated pt is testing 3 x daily but last rx direction(12/20/11) was 4x daily. So, they need a new rx for 3xdaily.  Thanks

## 2012-08-04 NOTE — Addendum Note (Signed)
Addended by: Othella Boyer on: 08/04/2012 03:47 PM   Modules accepted: Orders

## 2012-08-04 NOTE — Telephone Encounter (Signed)
Pt was called and informed of refill. 

## 2012-08-04 NOTE — Telephone Encounter (Signed)
Glucose test strip rx faxed to [facility].

## 2012-08-04 NOTE — Addendum Note (Signed)
Addended by: Larey Dresser A on: 08/04/2012 03:48 PM   Modules accepted: Orders

## 2012-08-04 NOTE — Telephone Encounter (Signed)
Dr Burnard Bunting, you click"Print".  Diabetic supplies cannot be called in to the pharmacy; electronic or faxed in.  Thanks

## 2012-08-11 ENCOUNTER — Encounter: Payer: Self-pay | Admitting: Internal Medicine

## 2012-08-12 ENCOUNTER — Encounter: Payer: Self-pay | Admitting: Internal Medicine

## 2012-08-12 ENCOUNTER — Other Ambulatory Visit (HOSPITAL_COMMUNITY)
Admission: RE | Admit: 2012-08-12 | Discharge: 2012-08-12 | Disposition: A | Discharge status: Home / Self Care | Payer: Medicare Other | Source: Ambulatory Visit | Attending: Internal Medicine | Admitting: Internal Medicine

## 2012-08-12 ENCOUNTER — Ambulatory Visit (INDEPENDENT_AMBULATORY_CARE_PROVIDER_SITE_OTHER): Payer: Medicare Other | Admitting: Internal Medicine

## 2012-08-12 VITALS — BP 155/89 | HR 79 | Temp 97.4°F | Wt 341.3 lb

## 2012-08-12 DIAGNOSIS — Z0000 Encounter for general adult medical examination without abnormal findings: Secondary | ICD-10-CM

## 2012-08-12 DIAGNOSIS — Z1151 Encounter for screening for human papillomavirus (HPV): Secondary | ICD-10-CM | POA: Insufficient documentation

## 2012-08-12 DIAGNOSIS — Z124 Encounter for screening for malignant neoplasm of cervix: Secondary | ICD-10-CM | POA: Diagnosis not present

## 2012-08-12 DIAGNOSIS — R5381 Other malaise: Secondary | ICD-10-CM | POA: Diagnosis not present

## 2012-08-12 DIAGNOSIS — E119 Type 2 diabetes mellitus without complications: Secondary | ICD-10-CM | POA: Diagnosis not present

## 2012-08-12 DIAGNOSIS — R5383 Other fatigue: Secondary | ICD-10-CM

## 2012-08-12 DIAGNOSIS — I10 Essential (primary) hypertension: Secondary | ICD-10-CM

## 2012-08-12 DIAGNOSIS — F17200 Nicotine dependence, unspecified, uncomplicated: Secondary | ICD-10-CM | POA: Diagnosis not present

## 2012-08-12 DIAGNOSIS — Z720 Tobacco use: Secondary | ICD-10-CM

## 2012-08-12 NOTE — Assessment & Plan Note (Signed)
Counseled on importance of smoking cessation, especially in setting of renal transplant.

## 2012-08-12 NOTE — Assessment & Plan Note (Signed)
BP Readings from Last 3 Encounters:  08/12/12 152/92  04/30/12 140/70  01/22/12 135/82    Lab Results  Component Value Date   NA 143 11/08/2011   K 3.7 11/08/2011   CREATININE 1.61* 11/08/2011    Assessment:  Blood pressure control: mildly elevated  Progress toward BP goal:  deteriorated   Plan:  Medications:  continue current medications - amlodipine, labetalol and clonidine    Other plans: Will defer changes to Dr. Arty Baumgartner ([place])

## 2012-08-12 NOTE — Patient Instructions (Signed)
General Instructions: -Please bring [place] the results of your most recent eye exam  -Please be sure to have your sleep study done  -Continue taking your insulin as you have been - your A1c = 6.3 today, and our goal for you is less than 7, great job! We do need to work on weight loss by eating healthy and getting exercise- please schedule an appointment with our diabetes educator, Debera Lat, at your convenience.  -Please be sure to follow up with Dr. Arty Baumgartner in February.  Please be sure to bring all of your medications with you to every visit.  Should you have any new or worsening symptoms, please be sure to call the clinic at [phone].   Treatment Goals:  Goals (1 Years of Data) as of 08/12/2012          As of Today 04/30/12 04/30/12 01/22/12 12/09/11     Blood Pressure    . Blood Pressure < 130/80  152/92 140/70 179/92 135/82 139/88     Lifestyle    . Plan meals- consistent carb between  40-60 grams per meal  No No  Yes      Result Component    . HEMOGLOBIN A1C < 7.0  6.3   6.2     . LDL CALC < 100            Progress Toward Treatment Goals:  Treatment Goal 08/12/2012  Hemoglobin A1C at goal  Blood pressure deteriorated  Stop smoking smoking less    Self Care Goals & Plans:  Self Care Goal 08/12/2012  Manage my medications take my medicines as prescribed; bring my medications to every visit; refill my medications on time  Monitor my health keep track of my blood glucose  Eat healthy foods eat smaller portions  Be physically active find an activity I enjoy  Stop smoking go to the [employer] website ([email or web address].info/)    Home Blood Glucose Monitoring 08/12/2012  Check my blood sugar 3 times a day  When to check my blood sugar before meals     Care Management & Community Referrals:  Referral 08/12/2012  Referrals made for care management support diabetes educator

## 2012-08-12 NOTE — Assessment & Plan Note (Signed)
Lab Results  Component Value Date   HGBA1C 6.3 08/12/2012   HGBA1C 6.2 01/22/2012   HGBA1C 10.0* 11/03/2011     Assessment:  Diabetes control: good control (HgbA1C at goal)  Progress toward A1C goal:  at goal  Plan:  Medications:  continue current medications - lantus 55 qHS and novolog 13 TID WC  Home glucose monitoring:   Frequency: 3 times a day   Timing: before meals  Instruction/counseling given: reminded to get eye exam, reminded to bring blood glucose meter & log to each visit, reminded to bring medications to each visit, discussed the need for weight loss and discussed diet

## 2012-08-12 NOTE — Assessment & Plan Note (Signed)
With weight gain, daytime sleepiness, morning headaches, and snoring, there is concern for OSA.  Refer patient for sleep study.

## 2012-08-12 NOTE — Progress Notes (Signed)
Subjective:   Patient ID: [name] female   DOB: [birth year] 47 y.o.   MRN: [id number]  HPI: [name] is a 47 y.o. woman with history of renal transplant, hypertension, and diabetes.    She is here for followup regarding her diabetes, which was diagnosed in May 2013, likely related to both her weight and tacrolimus.  She checks her blood sugars about 3 times per day (or more if needed), and notes that they ranged from 100-270, currently using novolog 13 TIDWC and lantus 55u qHS. No recent symptoms of hypoglycemia including her head feeling cloudy, or feel sweating (complaints from last visit). She reports polyuria and polydipsia in comparison to past.  Hasn't changed diet Breakfast: boiled egg, oatmeal, sometimes bacon/toast Lunch: varies - tuna wrap, salad, sandwhich Dinner: Chicken (fried, baked) Eye exam done ([facility]) - has reports at home  Regarding her history of renal failure and subsequent renal transplant, she is followed by Dr. Arty Baumgartner, who she recently saw on 05/20/12 during which visit he increased her labetalol to 300 TID, and also discontinued her bactrim, which she was on for ppx after transplant (2 years post transplant).  She is to f/u with Dr. Arty Baumgartner on Sep 02, 2012  Continues to complain of decreased energy and increased weight gain.  Tries to exercise.  Taking day time naps. Wakes up with headaches.  Caught herself snoring the other day.  Reports forgetfulness has improved since last visit - went to neurologist and told all was ok.  She has been tested for OSA in the past, and was negative per her report.  Her menstrual cycles have become more Irreg cycles (stopped for 6 months, but now irregular - last cycle ended last week (6 days)  Continues to smoke - trying to quit - has the gum, doesn't want to do patches; smokes about 5-6 cig/day.  Denies vaginal discharge, irritation.   Past Medical History  Diagnosis Date  . 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 Hypertension   . 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 GERD (gastroesophageal reflux disease)     medication induced   Current Outpatient Prescriptions  Medication Sig Dispense Refill  . ACCU-CHEK FASTCLIX LANCETS MISC 1 each by Does not apply route 4 (four) times daily. Dx code- 250.00  204 each  12  . amLODipine (NORVASC) 10 MG tablet Take 10 mg by mouth daily.      . Blood Glucose Monitoring Suppl (ACCU-CHEK NANO SMARTVIEW) W/DEVICE KIT 1 each by Does not apply route 4 (four) times daily.  1 kit  0  . cloNIDine (CATAPRES) 0.2 MG tablet Take 0.2 mg by mouth 3 (three) times daily.      [name] glucose blood (ACCU-CHEK SMARTVIEW) test strip 3x/day before meals and bedtime.  Dx code 250.00  150 each  11  . glucose monitoring kit (FREESTYLE) monitoring kit 1 each by Does not apply route as needed for other. For QID testing- test strips and lancets  1 each  0  . insulin aspart (NOVOLOG FLEXPEN) 100 UNIT/ML injection Inject 13 Units into the skin 3 (three) times daily before meals.  1 vial  12  . insulin glargine (LANTUS) 100 UNIT/ML injection Inject 55 Units into the skin at bedtime.  10 mL  3  . labetalol (NORMODYNE) 200 MG tablet Take 200 mg by mouth 3 (three) times daily.      . mycophenolate (MYFORTIC) 180 MG EC tablet Take 720 mg by mouth 2 (two) times daily.      [name] omeprazole (PRILOSEC) 20 MG capsule Take 20 mg by mouth daily.      . predniSONE (DELTASONE) 5 MG tablet Take 5 mg by mouth daily.      . tacrolimus (PROGRAF) 1 MG capsule Take 9 mg by mouth 2 (two) times daily.       Family History  Problem Relation Age of Onset  . Hypertension Mother   . Diabetes Mother   . Atrial  fibrillation Father   . Hypertension Father   . Arthritis Father   . Heart failure Sister   . Hypertension Sister     all 4 sisters  . Hypertension Brother     both brothers  . Diabetes Brother    History   Social History  . Marital Status: Single    Spouse Name: N/A    Number of Children: N/A  . Years of Education: N/A   Social History Main Topics  . Smoking status: Current Every Day Smoker -- 0.3 packs/day    Types: Cigarettes  . Smokeless tobacco: None     Comment:   Smoking 1 pack every 3 days- 6.25.13. encouraged her to call quitline again and sign up for support calls.   . Alcohol Use: No  . Drug Use: No  . Sexually Active: No   Other Topics Concern  . 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   Respiratory: Denies SOB, DOE, cough, chest tightness,  and wheezing.   Cardiovascular: Denies chest pain, palpitations and leg swelling.  Gastrointestinal: Denies nausea, vomiting, abdominal pain, diarrhea, constipation, blood in stool and abdominal distention.  Genitourinary: Denies dysuria, urgency, hematuria, flank pain and difficulty urinating.  Musculoskeletal: Denies myalgias, back pain, joint swelling, arthralgias and gait problem.  Skin: Denies pallor, rash and wound.  Neurological: Denies dizziness, seizures, syncope, weakness, light-headedness, numbness and headaches.  Psychiatric/Behavioral: Denies suicidal ideation, mood changes, confusion,  nervousness, sleep disturbance and agitation  Objective:  Physical Exam: Filed Vitals:   08/12/12 1318 08/12/12 1414  BP: 152/92 155/89  Pulse: 79   Temp: 97.4 F (36.3 C)   TempSrc: Oral   Weight: 341 lb 4.8 oz (154.813 kg)   SpO2: 98%    Constitutional: Vital signs reviewed.  Patient is an obese woman in no acute distress and cooperative with exam.  Head: Normocephalic and atraumatic Mouth: no erythema or exudates, MMM Eyes: PERRL, EOMI, conjunctivae normal, No scleral icterus.  Neck: Supple, Trachea midline normal ROM, No JVD, mass, thyromegaly, or carotid bruit present.  Cardiovascular: RRR, S1 normal, S2 normal, no MRG, pulses symmetric and intact bilaterally Pulmonary/Chest: CTAB, no wheezes, rales, or rhonchi Abdominal: Soft. Non-tender, non-distended, bowel sounds are  normal, no masses, organomegaly, or guarding present.  Pelvic Exam: no adnexal masses, no vaginal erythema or discharge Musculoskeletal: No joint deformities, erythema, or stiffness Neurological: A&O x3, Strength is normal and symmetric bilaterally, cranial nerve II-XII are grossly intact, no focal motor deficit, sensory intact to light touch bilaterally.  Skin: Warm, dry and intact. No rash, cyanosis, or clubbing.  Psychiatric: Normal mood and affect. speech and behavior is normal. Judgment and thought content normal. Cognition and memory are normal.   Assessment & Plan:  Case and care discussed with Dr. Ellwood Dense. Please see problem oriented charting for further details.  Patient to return in 3 months for further details.

## 2012-08-12 NOTE — Assessment & Plan Note (Signed)
Pap smear today. Flu shot done at [facility]. Declines tetanus shot.

## 2012-09-06 ENCOUNTER — Encounter (HOSPITAL_BASED_OUTPATIENT_CLINIC_OR_DEPARTMENT_OTHER): Payer: Medicare Other

## 2012-09-11 ENCOUNTER — Encounter (HOSPITAL_BASED_OUTPATIENT_CLINIC_OR_DEPARTMENT_OTHER): Payer: Medicare Other

## 2012-09-13 ENCOUNTER — Ambulatory Visit (HOSPITAL_BASED_OUTPATIENT_CLINIC_OR_DEPARTMENT_OTHER): Payer: Medicare Other | Attending: Internal Medicine

## 2012-09-13 VITALS — Ht 72.0 in | Wt 341.0 lb

## 2012-09-13 DIAGNOSIS — G4733 Obstructive sleep apnea (adult) (pediatric): Secondary | ICD-10-CM | POA: Diagnosis not present

## 2012-09-13 DIAGNOSIS — R5383 Other fatigue: Secondary | ICD-10-CM

## 2012-09-30 DIAGNOSIS — G4710 Hypersomnia, unspecified: Secondary | ICD-10-CM | POA: Diagnosis not present

## 2012-09-30 DIAGNOSIS — G4730 Sleep apnea, unspecified: Secondary | ICD-10-CM

## 2012-09-30 NOTE — Procedures (Signed)
NAMEJRU, BELLMORE NO.:  [id number]  MEDICAL RECORD NO.:  [id number]          PATIENT TYPE:  OUT  LOCATION:  SLEEP CENTER                 FACILITY[facility]  PHYSICIAN:  Kathee Delton, MD,FCCPDATE OF BIRTH:  08/11/63  DATE OF STUDY:  09/13/2012                           NOCTURNAL POLYSOMNOGRAM  REFERRING PHYSICIAN:  Cletus Gash PAYA  INDICATION FOR STUDY:  Hypersomnia with sleep apnea.  EPWORTH SLEEPINESS SCORE:  7.  MEDICATIONS:  SLEEP ARCHITECTURE:  The patient had total sleep time of 344 minutes with decreased quantity of slow wave sleep for age as well as REM. Sleep onset latency was normal at 4 minutes and REM onset was prolonged at 180 minutes.  Sleep efficiency, however, was excellent at 96%.  RESPIRATORY DATA:  The patient was found to have 7 obstructive and central apneas, as well as 28 obstructive hypopneas, giving her an apnea- hypopnea index of only 6 events per hour.  The events occurred primarily in the supine position, and there was loud snoring noted throughout.  OXYGEN DATA:  There was O2 desaturation as low as 85% with the obstructive events.  These desaturations were very transient in nature.  CARDIAC DATA:  Occasional PVC and fusion beat, but no clinically significant arrhythmias were seen.  MOVEMENT-PARASOMNIA:  No significant leg jerks or other abnormal behaviors were noted.  IMPRESSIONS-RECOMMENDATIONS: 1. Minimal obstructive sleep apnea/hypopnea syndrome, with an AHI of 6     events per hour and transient oxygen desaturation as low as 85%.     Treatment for this degree of sleep apnea can include a trial of     weight loss alone, upper airway surgery, dental appliance, and also     CPAP.  The decision to treat this minimal sleep apnea should depend     upon its impact to the patient's quality of life, since this     represents very little cardiovascular risk at this     time.  Clinical correlation is suggested. 2. Occasional  PVC and fusion beat seen, but no clinically significant     arrhythmias were noted.     Kathee Delton, MD,FCCP Diplomate, [place] Board of Sleep Medicine    [facility]/MEDQ  D:  09/30/2012 08:09:40  T:  09/30/2012 08:34:19  Job:  [id number]

## 2012-10-05 DIAGNOSIS — I10 Essential (primary) hypertension: Secondary | ICD-10-CM | POA: Diagnosis not present

## 2012-10-05 DIAGNOSIS — Z79899 Other long term (current) drug therapy: Secondary | ICD-10-CM | POA: Diagnosis not present

## 2012-10-05 DIAGNOSIS — E119 Type 2 diabetes mellitus without complications: Secondary | ICD-10-CM | POA: Diagnosis not present

## 2012-10-05 DIAGNOSIS — D509 Iron deficiency anemia, unspecified: Secondary | ICD-10-CM | POA: Diagnosis not present

## 2012-10-05 DIAGNOSIS — E7800 Pure hypercholesterolemia, unspecified: Secondary | ICD-10-CM | POA: Diagnosis not present

## 2012-10-05 DIAGNOSIS — Z940 Kidney transplant status: Secondary | ICD-10-CM | POA: Diagnosis not present

## 2012-11-11 DIAGNOSIS — E7800 Pure hypercholesterolemia, unspecified: Secondary | ICD-10-CM | POA: Diagnosis not present

## 2012-11-11 DIAGNOSIS — Z79899 Other long term (current) drug therapy: Secondary | ICD-10-CM | POA: Diagnosis not present

## 2012-11-11 DIAGNOSIS — I10 Essential (primary) hypertension: Secondary | ICD-10-CM | POA: Diagnosis not present

## 2012-11-11 DIAGNOSIS — D509 Iron deficiency anemia, unspecified: Secondary | ICD-10-CM | POA: Diagnosis not present

## 2012-11-11 DIAGNOSIS — Z940 Kidney transplant status: Secondary | ICD-10-CM | POA: Diagnosis not present

## 2012-11-11 DIAGNOSIS — E119 Type 2 diabetes mellitus without complications: Secondary | ICD-10-CM | POA: Diagnosis not present

## 2012-11-13 DIAGNOSIS — H524 Presbyopia: Secondary | ICD-10-CM | POA: Diagnosis not present

## 2012-11-13 DIAGNOSIS — E119 Type 2 diabetes mellitus without complications: Secondary | ICD-10-CM | POA: Diagnosis not present

## 2012-11-13 DIAGNOSIS — H52229 Regular astigmatism, unspecified eye: Secondary | ICD-10-CM | POA: Diagnosis not present

## 2012-11-13 DIAGNOSIS — H5200 Hypermetropia, unspecified eye: Secondary | ICD-10-CM | POA: Diagnosis not present

## 2012-11-23 ENCOUNTER — Ambulatory Visit (INDEPENDENT_AMBULATORY_CARE_PROVIDER_SITE_OTHER): Payer: Medicare Other | Admitting: Internal Medicine

## 2012-11-23 ENCOUNTER — Encounter: Payer: Self-pay | Admitting: Internal Medicine

## 2012-11-23 VITALS — BP 136/88 | HR 69 | Temp 98.8°F | Ht 72.0 in | Wt 343.5 lb

## 2012-11-23 DIAGNOSIS — E119 Type 2 diabetes mellitus without complications: Secondary | ICD-10-CM

## 2012-11-23 DIAGNOSIS — I10 Essential (primary) hypertension: Secondary | ICD-10-CM | POA: Diagnosis not present

## 2012-11-23 LAB — GLUCOSE, CAPILLARY: Glucose-Capillary: 172 mg/dL — ABNORMAL HIGH (ref 70–99)

## 2012-11-23 LAB — POCT GLYCOSYLATED HEMOGLOBIN (HGB A1C): Hemoglobin A1C: 5.8

## 2012-11-23 NOTE — Progress Notes (Signed)
Patient ID: [name], female   DOB: 03/27/1966, 47 y.o.   MRN: [id number]  Internal Medicine Clinic Visit    HPI:  [name] is a 47 y.o. year old female with a history of type 2 diabetes on insulin, renal failure s/p cadaver renal transplant, hypertension, GERD.  She presents for diabetes followup today. Her last A1c was 6.3 in February 2014. She has been well controlled on Lantus 55 units each bedtime and NovoLog 13 units 3 times a day with meals. She did bring her meter for review today and states that she checks her sugar 3 times a day or if she is feeling poorly. She denies any symptoms of hypo-glycemia or hyperglycemia. She did state that her brother-in-law check his blood sugar several times with her meter as he is newly diagnosed and did not have supplies. Patient does check her feet on a regular basis.  She has been doing better with diet and exercise, she started doing Wii dance, wants to start walking outside. She also has completely cut out Pepsi from her diet.  For her blood pressure, she takes clonidine TID, amlodipine QHS, labetalol. Checks BP at home, runs 120s/70s. She is compliant with her medicines, she has not skipped any doses.  She denies any chest pain, shortness of breath, dysuria, urinary frequency, hematuria, flank pain, fever, chills.  Still smoking 1/2 pack but has cut down since previous, trying to quit but she has strong desire to smoke when she is feeling stressed out.   Past Medical History  Diagnosis Date  . 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 Hypertension   . 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 GERD (gastroesophageal reflux disease)     medication induced     Past Surgical History  Procedure Laterality Date  . Kidney transplant    . Dg av dialysis  shunt access exist*l* or    . Cholecystectomy    . Umbilical hernia repair    . Cesarean section  1991, 1993     ROS:  A complete review of systems was otherwise negative, except as noted in the HPI.  Allergies: Ancef; Infed; and Other  Medications: Current Outpatient Prescriptions  Medication Sig Dispense Refill  . ACCU-CHEK FASTCLIX LANCETS MISC 1 each by Does not apply route 4 (four) times daily. Dx code- 250.00  204 each  12  . amLODipine (NORVASC) 10 MG tablet Take 10 mg by mouth daily.      . Blood Glucose Monitoring Suppl (ACCU-CHEK NANO SMARTVIEW) W/DEVICE KIT 1 each by Does not apply route 4 (four) times daily.  1 kit  0  . cloNIDine (CATAPRES) 0.2 MG tablet Take 0.2 mg by mouth 3 (three) times daily.      [name] glucose blood (ACCU-CHEK SMARTVIEW) test strip 3x/day before meals and bedtime.  Dx code 250.00  150 each  11  . insulin aspart (NOVOLOG FLEXPEN) 100 UNIT/ML injection Inject 13 Units into the skin 3 (three) times daily before meals.  1 vial  12  . insulin glargine (LANTUS) 100 UNIT/ML injection Inject 55 Units into the skin at bedtime.  10 mL  3  . labetalol (NORMODYNE) 300 MG  tablet Take 300 mg by mouth 3 (three) times daily.      . mycophenolate (MYFORTIC) 180 MG EC tablet Take 720 mg by mouth 2 (two) times daily.      [name] omeprazole (PRILOSEC) 20 MG capsule Take 20 mg by mouth daily.      . predniSONE (DELTASONE) 5 MG tablet Take 5 mg by mouth daily.       No current facility-administered medications for this visit.    History   Social History  . Marital Status: Single    Spouse Name: N/A    Number of Children: N/A  . Years of Education: N/A   Occupational History  . Not on file.   Social History Main Topics  . Smoking status: Current Every Day Smoker -- 0.40 packs/day    Types: Cigarettes  . Smokeless tobacco: Not on file     Comment:   Smoking 1 pack every  3 days- 6.25.13. encouraged her to call quitline again and sign up for support calls.   . Alcohol Use: No  . Drug Use: No  . Sexually Active: No   Other Topics Concern  . 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  Physical Exam There were no vitals taken for this visit. General:  No acute distress, obese, alert and oriented x 3, well-appearing AAF HEENT:  PERRL, EOMI, no lymphadenopathy, moist mucous membranes Cardiovascular:  Regular rate and rhythm, no murmurs, rubs or gallops Respiratory:  Clear to auscultation bilaterally, no wheezes, rales, or rhonchi Abdomen:  Soft, nondistended, nontender, normoactive bowel sounds Extremities:  Warm and well-perfused, no clubbing, cyanosis, or edema. Foot exam normal. Good pulses. Skin: Warm, dry, no rashes Neuro: Not anxious appearing, no depressed mood, normal affect  Labs: Lab Results  Component Value Date   CREATININE 1.61* 11/08/2011   BUN 29* 11/08/2011   NA 143 11/08/2011   K 3.7 11/08/2011   CL 116* 11/08/2011   CO2 19 11/08/2011   Lab Results  Component Value Date   WBC 5.8 11/05/2011   HGB 13.3 11/05/2011   HCT 37.7 11/05/2011   MCV 81.3 11/05/2011   PLT 142* 11/05/2011      Assessment and Plan:  Plan was discussed with Dr. Beaulah Corin YA  FOLLOWUP: [name] will follow back up in our clinic in approximately 3 months. [name] knows to call out clinic in the meantime with any questions or new issues.

## 2012-11-23 NOTE — Assessment & Plan Note (Signed)
BP Readings from Last 3 Encounters:  11/23/12 136/88  08/12/12 155/89  04/30/12 140/70    Lab Results  Component Value Date   NA 143 11/08/2011   K 3.7 11/08/2011   CREATININE 1.61* 11/08/2011    Assessment: Blood pressure control: controlled Progress toward BP goal:  improved Comments:   Plan: Medications:  continue current medications Educational resources provided: brochure;video Self management tools provided: home blood pressure logbook Other plans:   -patient with initial BP 166/104 but patient was rushing to get to the office, repeat was WNL -asked patient to check her blood pressure daily at home and keep a log and bring it to the next visit for review

## 2012-11-23 NOTE — Assessment & Plan Note (Signed)
Lab Results  Component Value Date   HGBA1C 5.8 11/23/2012   HGBA1C 6.3 08/12/2012   HGBA1C 6.2 01/22/2012     Assessment: Diabetes control: good control (HgbA1C at goal) Progress toward A1C goal:  at goal Comments: excellent control  Plan: Medications:  continue current medications Home glucose monitoring: Frequency: 3 times a day Timing: before breakfast;before meals Instruction/counseling given: reminded to bring blood glucose meter & log to each visit, discussed foot care, discussed the need for weight loss and discussed diet Educational resources provided: brochure Self management tools provided: copy of home glucose meter download;home glucose logbook Other plans:   -discussed weight loss techniques and encouraged physical activity and dietary modifications

## 2012-11-25 NOTE — Progress Notes (Signed)
Case discussed with Dr. Sissy Hoff immediately after the resident saw the patient. We reviewed the resident's history and exam and pertinent patient test results. I agree with the assessment, diagnosis and plan of care documented in the resident's note.

## 2012-12-09 DIAGNOSIS — E7800 Pure hypercholesterolemia, unspecified: Secondary | ICD-10-CM | POA: Diagnosis not present

## 2012-12-09 DIAGNOSIS — Z79899 Other long term (current) drug therapy: Secondary | ICD-10-CM | POA: Diagnosis not present

## 2012-12-09 DIAGNOSIS — E119 Type 2 diabetes mellitus without complications: Secondary | ICD-10-CM | POA: Diagnosis not present

## 2012-12-09 DIAGNOSIS — Z940 Kidney transplant status: Secondary | ICD-10-CM | POA: Diagnosis not present

## 2012-12-09 DIAGNOSIS — D509 Iron deficiency anemia, unspecified: Secondary | ICD-10-CM | POA: Diagnosis not present

## 2012-12-09 DIAGNOSIS — I10 Essential (primary) hypertension: Secondary | ICD-10-CM | POA: Diagnosis not present

## 2012-12-15 DIAGNOSIS — Z940 Kidney transplant status: Secondary | ICD-10-CM | POA: Diagnosis not present

## 2012-12-16 DIAGNOSIS — D509 Iron deficiency anemia, unspecified: Secondary | ICD-10-CM | POA: Diagnosis not present

## 2012-12-16 DIAGNOSIS — T8610 Unspecified complication of kidney transplant: Secondary | ICD-10-CM | POA: Diagnosis not present

## 2012-12-16 DIAGNOSIS — I10 Essential (primary) hypertension: Secondary | ICD-10-CM | POA: Diagnosis not present

## 2012-12-16 DIAGNOSIS — Z940 Kidney transplant status: Secondary | ICD-10-CM | POA: Diagnosis not present

## 2012-12-30 ENCOUNTER — Other Ambulatory Visit: Payer: Self-pay | Admitting: Internal Medicine

## 2013-01-14 ENCOUNTER — Other Ambulatory Visit: Payer: Self-pay

## 2013-02-16 DIAGNOSIS — Z79899 Other long term (current) drug therapy: Secondary | ICD-10-CM | POA: Diagnosis not present

## 2013-02-16 DIAGNOSIS — Z940 Kidney transplant status: Secondary | ICD-10-CM | POA: Diagnosis not present

## 2013-02-16 DIAGNOSIS — D509 Iron deficiency anemia, unspecified: Secondary | ICD-10-CM | POA: Diagnosis not present

## 2013-02-16 DIAGNOSIS — I10 Essential (primary) hypertension: Secondary | ICD-10-CM | POA: Diagnosis not present

## 2013-02-16 DIAGNOSIS — E7800 Pure hypercholesterolemia, unspecified: Secondary | ICD-10-CM | POA: Diagnosis not present

## 2013-02-16 DIAGNOSIS — E119 Type 2 diabetes mellitus without complications: Secondary | ICD-10-CM | POA: Diagnosis not present

## 2013-03-01 ENCOUNTER — Other Ambulatory Visit: Payer: Self-pay | Admitting: Internal Medicine

## 2013-03-25 ENCOUNTER — Encounter: Payer: Self-pay | Admitting: Internal Medicine

## 2013-03-25 ENCOUNTER — Ambulatory Visit (INDEPENDENT_AMBULATORY_CARE_PROVIDER_SITE_OTHER): Payer: Medicare Other | Admitting: Internal Medicine

## 2013-03-25 VITALS — BP 146/91 | HR 65 | Temp 97.6°F | Ht 71.0 in | Wt 337.3 lb

## 2013-03-25 DIAGNOSIS — Z720 Tobacco use: Secondary | ICD-10-CM

## 2013-03-25 DIAGNOSIS — I10 Essential (primary) hypertension: Secondary | ICD-10-CM

## 2013-03-25 DIAGNOSIS — K219 Gastro-esophageal reflux disease without esophagitis: Secondary | ICD-10-CM | POA: Diagnosis not present

## 2013-03-25 DIAGNOSIS — E119 Type 2 diabetes mellitus without complications: Secondary | ICD-10-CM

## 2013-03-25 DIAGNOSIS — Z0000 Encounter for general adult medical examination without abnormal findings: Secondary | ICD-10-CM | POA: Diagnosis not present

## 2013-03-25 DIAGNOSIS — Z940 Kidney transplant status: Secondary | ICD-10-CM | POA: Diagnosis not present

## 2013-03-25 DIAGNOSIS — F17200 Nicotine dependence, unspecified, uncomplicated: Secondary | ICD-10-CM | POA: Diagnosis not present

## 2013-03-25 DIAGNOSIS — R5381 Other malaise: Secondary | ICD-10-CM

## 2013-03-25 DIAGNOSIS — Z23 Encounter for immunization: Secondary | ICD-10-CM | POA: Diagnosis not present

## 2013-03-25 DIAGNOSIS — R5383 Other fatigue: Secondary | ICD-10-CM

## 2013-03-25 LAB — GLUCOSE, CAPILLARY: Glucose-Capillary: 155 mg/dL — ABNORMAL HIGH (ref 70–99)

## 2013-03-25 NOTE — Progress Notes (Signed)
Subjective:   Patient ID: [name] female   DOB: 10/20/1965 47 y.o.   MRN: [id number]  HPI: [name] is a 47 y.o. woman with history of renal transplant (follows at [name] with Dr. Marval [name]), hypertension, and diabetes  She is here for followup regarding her diabetes, which was diagnosed in May 2013, likely related to both her weight and tacrolimus. She checks her blood sugars about 3 times per day (or more if needed), and notes that they ranged from 133-312 (58% above range), currently using novolog 13 TIDWC and lantus 55u qHS.  No recent symptoms of hypoglycemia including palpitations, dizziness, lightheadedness (no low CBGs).  No polyuria/polyphagia/polydipsia. A few weeks ago, stopped drinking pepsi, and blood sugars have improved (it was in the 300s - now sugar comes in coffee as she has been out of sweet & low but has been out)  Weight loss 343--> 337 Breakfast: 2 boiled eggs, small bowl of oatmeal or toast with coffee Lunch: usually dinner leftovers Dinner: baked chicken and rice with veggies Eye exam done ([address]) --> 11/13/12 Due for foot exam and Tetanus  Regarding her history of renal failure and subsequent renal transplant, she is followed by Dr. Arty [name], who she recently saw on 12/16/12 during which visit he discussed the possibility of adding an ACEI/ARB to her BP regimen. Bactrim was d/c prior to my last visit with her, which she was on for ppx after transplant (2 years post transplant). She is to f/u with Dr. Arty [name] on 04/22/13.   Review of Systems: Constitutional: Denies fever, chills, diaphoresis, appetite change; fatigue is improved (sleepy with clonidine)  HEENT: Denies photophobia, eye pain, redness, hearing loss, ear pain, congestion, sore throat, rhinorrhea, sneezing, mouth sores, trouble swallowing, neck pain, neck stiffness and tinnitus.  Respiratory: Denies SOB, DOE, cough, chest tightness, and wheezing.  Cardiovascular: Denies  chest pain, palpitations and leg swelling.  Gastrointestinal: Denies nausea, vomiting, abdominal pain, diarrhea, constipation,blood in stool and abdominal distention.  Genitourinary: Denies dysuria, urgency, frequency, hematuria, flank pain and difficulty urinating.  Neurological: Denies dizziness, seizures, syncope, weakness, lightheadedness, numbness and headaches.   Past Medical History  Diagnosis Date  . 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 Hypertension   . 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 GERD (gastroesophageal reflux disease)     medication induced   Current Outpatient Prescriptions  Medication Sig Dispense Refill  . ACCU-CHEK FASTCLIX LANCETS MISC 1 each by Does not apply route 4 (four) times daily. Dx code- 250.00  204 each  12  . Alcohol Swabs (SM ALCOHOL PREP) 70 % PADS       . amLODipine (NORVASC) 10 MG tablet Take 10 mg by mouth daily.      . B-D ULTRAFINE III SHORT PEN 31G X 8 MM MISC       . Blood Glucose Monitoring Suppl (ACCU-CHEK NANO SMARTVIEW) W/DEVICE KIT 1 each by Does not apply route 4 (four) times daily.  1 kit  0  . cloNIDine (CATAPRES) 0.2 MG tablet Take 0.2 mg by mouth 3 (three) times daily.      [name] glucose blood (ACCU-CHEK SMARTVIEW) test strip 3x/day before  meals and bedtime.  Dx code 250.00  150 each  11  . insulin aspart (NOVOLOG FLEXPEN) 100 UNIT/ML injection Inject 13 Units into the skin 3 (three) times daily before meals.  1 vial  12  . labetalol (NORMODYNE) 300 MG tablet Take 300 mg by mouth 3 (three) times daily.      [name] LANTUS SOLOSTAR 100 UNIT/ML SOPN INJECT 60 UNITS INTO THE SKIN EVERY NIGHT AT BEDTIME  15 mL  11  . mycophenolate (MYFORTIC) 180 MG EC tablet  Take 720 mg by mouth 2 (two) times daily.      [name] NOVOLOG FLEXPEN 100 UNIT/ML SOPN FlexPen INJECT 13 UNITS INTO THE SKIN THREE TIMES DAILY WITH MEALS  15 mL  11  . omeprazole (PRILOSEC) 20 MG capsule Take 20 mg by mouth daily.      . predniSONE (DELTASONE) 5 MG tablet Take 5 mg by mouth daily.       No current facility-administered medications for this visit.   Family History  Problem Relation Age of Onset  . Hypertension Mother   . Diabetes Mother   . Atrial fibrillation Father   . Hypertension Father   . Arthritis Father   . Heart failure Sister   . Hypertension Sister     all 4 sisters  . Hypertension Brother     both brothers  . Diabetes Brother    History   Social History  . Marital Status: Single    Spouse Name: N/A    Number of Children: N/A  . Years of Education: N/A   Social History Main Topics  . Smoking status: Current Every Day Smoker -- 0.50 packs/day    Types: Cigarettes  . Smokeless tobacco: Not on file     Comment:   Smoking 1 pack every 3 days- 6.25.13. encouraged her to call quitline again and sign up for support calls.   . Alcohol Use: No  . Drug Use: No  . Sexual Activity: No   Other Topics Concern  . 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   [place] from [place] with 2 adult children, 1 dog in the house    Objective:  Physical Exam: Filed Vitals:   03/25/13 0827 03/25/13 0904  BP: 153/95 146/91  Pulse: 64 65  Temp: 97.6 F (36.4 C)   TempSrc: Oral   Height: 5\' 11"  (1.803 m)   Weight: 337 lb 4.8 oz (152.998 kg)   SpO2: 96%    General: obese HEENT: PERRL, EOMI, no scleral icterus Cardiac: RRR, no rubs, murmurs or gallops Pulm: clear to auscultation bilaterally, moving normal volumes of air Abd: soft, nontender, nondistended, BS  present Ext: warm and well perfused, no pedal edema Neuro: alert and oriented X3, cranial nerves II-XII grossly intact  Assessment & Plan:  Case and care discussed with Dr. Ellwood Dense.  Please see problem oriented charting for further details. Patient to return in 3 months for DM followup.

## 2013-03-25 NOTE — Assessment & Plan Note (Signed)
  Assessment: Progress toward smoking cessation:  smoking the same amount Barriers to progress toward smoking cessation:  lack of motivation to quit  Plan: Instruction/counseling given:  I counseled patient on the dangers of tobacco use, advised patient to stop smoking, and reviewed strategies to maximize success. Educational resources provided:  [facility] (1-800-QUIT-NOW) brochure Medications to assist with smoking cessation:  Nicotine Patch - referred below Patient agreed to the following self-care plans for smoking cessation: call [facility] (1-800-QUIT-NOW);go to the [employer] (www.quitlinenc.com);cut down the number of cigarettes smoked

## 2013-03-25 NOTE — Assessment & Plan Note (Signed)
Lab Results  Component Value Date   HGBA1C 6.7 03/25/2013   HGBA1C 5.8 11/23/2012   HGBA1C 6.3 08/12/2012     Assessment: Diabetes control: good control (HgbA1C at goal) Progress toward A1C goal:  deteriorated  Plan: Medications:  Increase lantus to 60u as it was supposed to be, plan for tight control to maintain kidneys Home glucose monitoring: Frequency: 3 times a day Timing: before breakfast;at bedtime;before lunch Instruction/counseling given: reminded to bring blood glucose meter & log to each visit, reminded to bring medications to each visit, discussed the need for weight loss and discussed diet Educational resources provided: brochure;handout Self management tools provided: copy of home glucose meter download

## 2013-03-25 NOTE — Assessment & Plan Note (Signed)
Tdap today

## 2013-03-25 NOTE — Assessment & Plan Note (Addendum)
Improving with weight loss. Sleep study suggestive of mild OSA - now sleeping better.  Clonidine likely contributing.

## 2013-03-25 NOTE — Assessment & Plan Note (Signed)
BP Readings from Last 3 Encounters:  03/25/13 153/95  11/23/12 136/88  08/12/12 155/89  Repeat 146/91  Lab Results  Component Value Date   NA 143 11/08/2011   K 3.7 11/08/2011   CREATININE 1.61* 11/08/2011    Assessment: Blood pressure control: mildly elevated Progress toward BP goal:  deteriorated  Plan: Medications:  Managed by Dr. Marval Regal - patient compliant with labetalol, amlodipine and clonidine - Dr. Ambrose Pancoast is considering adding an ACEI/ARB to her combo - appt in Oct Educational resources provided: brochure;handout;video Self management tools provided:   Other plans: cont weight loss

## 2013-03-25 NOTE — Patient Instructions (Signed)
General Instructions: -Please increase your lantus to 60 units before bedtime (from 55 units); continue to cut out the sodas, you're doing great with your weight loss! It's great to get into this habit before the holidays!  -Continue to follow up with Dr. Marval Regal  Please be sure to bring all of your medications with you to every visit.  Should you have any new or worsening symptoms, please be sure to call the clinic at [phone].  Treatment Goals:  Goals (1 Years of Data) as of 03/25/13         As of Today 11/23/12 11/23/12 08/12/12 08/12/12     Blood Pressure    . Blood Pressure < 130/80  153/95 136/88 166/104 155/89 152/92     Lifestyle    . Plan meals- consistent carb between  40-60 grams per meal     No      Result Component    . HEMOGLOBIN A1C < 7.0  6.7 5.8  6.3     . LDL CALC < 100            Progress Toward Treatment Goals:  Treatment Goal 03/25/2013  Hemoglobin A1C -  Blood pressure deteriorated  Stop smoking smoking the same amount    Self Care Goals & Plans:  Self Care Goal 03/25/2013  Manage my medications take my medicines as prescribed; bring my medications to every visit; refill my medications on time; follow the sick day instructions if I am sick  Monitor my health keep track of my blood glucose; bring my glucose meter and log to each visit; keep track of my blood pressure; keep track of my weight; check my feet daily  Eat healthy foods eat more vegetables; eat fruit for snacks and desserts; eat baked foods instead of fried foods; eat smaller portions; drink diet soda or water instead of juice or soda  Be physically active take a walk every day; find an activity I enjoy  Stop smoking call [facility] (1-800-QUIT-NOW); go to the [employer] (https://scott-booker.info/); cut down the number of cigarettes smoked  Meeting treatment goals maintain the current self-care plan    Home Blood Glucose Monitoring 03/25/2013  Check my blood sugar 3 times a day  When to check  my blood sugar before breakfast; at bedtime; before lunch     Care Management & Community Referrals:  Referral 08/12/2012  Referrals made for care management support diabetes educator

## 2013-03-25 NOTE — Progress Notes (Signed)
Case discussed with Dr. Sharda at the time of the visit.  We reviewed the resident's history and exam and pertinent patient test results.  I agree with the assessment, diagnosis, and plan of care documented in the resident's note.    

## 2013-04-09 DIAGNOSIS — I10 Essential (primary) hypertension: Secondary | ICD-10-CM | POA: Diagnosis not present

## 2013-04-09 DIAGNOSIS — E119 Type 2 diabetes mellitus without complications: Secondary | ICD-10-CM | POA: Diagnosis not present

## 2013-04-09 DIAGNOSIS — E7800 Pure hypercholesterolemia, unspecified: Secondary | ICD-10-CM | POA: Diagnosis not present

## 2013-04-09 DIAGNOSIS — Z79899 Other long term (current) drug therapy: Secondary | ICD-10-CM | POA: Diagnosis not present

## 2013-04-09 DIAGNOSIS — Z940 Kidney transplant status: Secondary | ICD-10-CM | POA: Diagnosis not present

## 2013-04-09 DIAGNOSIS — D509 Iron deficiency anemia, unspecified: Secondary | ICD-10-CM | POA: Diagnosis not present

## 2013-04-22 DIAGNOSIS — D509 Iron deficiency anemia, unspecified: Secondary | ICD-10-CM | POA: Diagnosis not present

## 2013-04-22 DIAGNOSIS — I10 Essential (primary) hypertension: Secondary | ICD-10-CM | POA: Diagnosis not present

## 2013-04-22 DIAGNOSIS — E119 Type 2 diabetes mellitus without complications: Secondary | ICD-10-CM | POA: Diagnosis not present

## 2013-04-22 DIAGNOSIS — Z940 Kidney transplant status: Secondary | ICD-10-CM | POA: Diagnosis not present

## 2013-04-22 DIAGNOSIS — Z23 Encounter for immunization: Secondary | ICD-10-CM | POA: Diagnosis not present

## 2013-05-05 ENCOUNTER — Encounter: Payer: Self-pay | Admitting: *Deleted

## 2013-05-06 DIAGNOSIS — I120 Hypertensive chronic kidney disease with stage 5 chronic kidney disease or end stage renal disease: Secondary | ICD-10-CM | POA: Diagnosis not present

## 2013-05-06 DIAGNOSIS — Z940 Kidney transplant status: Secondary | ICD-10-CM | POA: Diagnosis not present

## 2013-05-18 DIAGNOSIS — E669 Obesity, unspecified: Secondary | ICD-10-CM | POA: Diagnosis not present

## 2013-05-18 DIAGNOSIS — E119 Type 2 diabetes mellitus without complications: Secondary | ICD-10-CM | POA: Diagnosis not present

## 2013-05-18 DIAGNOSIS — Z48298 Encounter for aftercare following other organ transplant: Secondary | ICD-10-CM | POA: Diagnosis not present

## 2013-05-18 DIAGNOSIS — Z940 Kidney transplant status: Secondary | ICD-10-CM | POA: Diagnosis not present

## 2013-05-18 DIAGNOSIS — Z0000 Encounter for general adult medical examination without abnormal findings: Secondary | ICD-10-CM | POA: Diagnosis not present

## 2013-07-22 DIAGNOSIS — D631 Anemia in chronic kidney disease: Secondary | ICD-10-CM | POA: Diagnosis not present

## 2013-07-22 DIAGNOSIS — Z79899 Other long term (current) drug therapy: Secondary | ICD-10-CM | POA: Diagnosis not present

## 2013-07-22 DIAGNOSIS — Z940 Kidney transplant status: Secondary | ICD-10-CM | POA: Diagnosis not present

## 2013-07-28 ENCOUNTER — Encounter: Payer: Medicare Other | Admitting: Internal Medicine

## 2013-07-28 ENCOUNTER — Ambulatory Visit (INDEPENDENT_AMBULATORY_CARE_PROVIDER_SITE_OTHER): Payer: Medicare Other | Admitting: Internal Medicine

## 2013-07-28 ENCOUNTER — Encounter: Payer: Self-pay | Admitting: Internal Medicine

## 2013-07-28 VITALS — BP 155/92 | HR 66 | Temp 97.4°F | Ht 71.0 in | Wt 327.4 lb

## 2013-07-28 DIAGNOSIS — Z0000 Encounter for general adult medical examination without abnormal findings: Secondary | ICD-10-CM | POA: Diagnosis not present

## 2013-07-28 DIAGNOSIS — I10 Essential (primary) hypertension: Secondary | ICD-10-CM | POA: Diagnosis not present

## 2013-07-28 DIAGNOSIS — E119 Type 2 diabetes mellitus without complications: Secondary | ICD-10-CM

## 2013-07-28 DIAGNOSIS — R5381 Other malaise: Secondary | ICD-10-CM | POA: Diagnosis not present

## 2013-07-28 DIAGNOSIS — R5383 Other fatigue: Secondary | ICD-10-CM | POA: Diagnosis not present

## 2013-07-28 DIAGNOSIS — J069 Acute upper respiratory infection, unspecified: Secondary | ICD-10-CM | POA: Diagnosis not present

## 2013-07-28 DIAGNOSIS — K219 Gastro-esophageal reflux disease without esophagitis: Secondary | ICD-10-CM | POA: Diagnosis not present

## 2013-07-28 DIAGNOSIS — Z23 Encounter for immunization: Secondary | ICD-10-CM | POA: Diagnosis not present

## 2013-07-28 DIAGNOSIS — F17200 Nicotine dependence, unspecified, uncomplicated: Secondary | ICD-10-CM | POA: Diagnosis not present

## 2013-07-28 DIAGNOSIS — Z940 Kidney transplant status: Secondary | ICD-10-CM | POA: Diagnosis not present

## 2013-07-28 LAB — GLUCOSE, CAPILLARY: Glucose-Capillary: 123 mg/dL — ABNORMAL HIGH (ref 70–99)

## 2013-07-28 LAB — POCT GLYCOSYLATED HEMOGLOBIN (HGB A1C): HEMOGLOBIN A1C: 6.7

## 2013-07-28 NOTE — Progress Notes (Signed)
Subjective:   Patient ID: [name] female   DOB: [birth year] 48 y.o.   MRN: [id number]  Chief Complaint  Patient presents with  . Follow-up    Past 6 days - head and chest congestion. Nonproductive cough and diarrhea. Getting better.    HPI: [name] is a 48 y.o. woman with history of renal transplant (follows at [place] with Dr. Marval Regal), hypertension, and diabetes. She was scheduled for a routine visit, but is having acute issues.   Went to have blood work last [employer] at [employer] for [employer], later that evening started feeling badly, coughing, sneezing, rhinorrhea worsened by Friday. No sick contacts, but her daughter came Fri who was similarly sick but with sore throat as well.  Patient without sore throat or fever.  Simply saline has provided relief.   No upset stomach, but on Friday, also developed diarrhea, usually after eating, nonbloody, On Friday, had about 6 watery, loose stools- similar on Saturday, started abating to about 4 on Sunday, normal BM this morning, last loose BM was yesterday (2 BMs yesterday). No vomiting or nausea.   Regarding DM, A1c is 6.7 today. Doesn't have meter with her today. No low blood CBGs (lowest ~100).  High CBG last month, but has improved (holidays). 10 lbs down from Sept - generally watching carbs - trying to keep to one carb per meal (ie- if she eats oatmeal, won't eat toast); decreased soda intake to 1-2 per week (regular soda).  To see Dr. Servando Salina Feb 12th.   HM: lipid, flu shot, A1c   Review of Systems: Constitutional: Denies fever, chills, diaphoresis, appetite change HEENT: Denies photophobia, eye pain, redness, hearing loss, ear pain, trouble swallowing, neck pain, neck stiffness and tinnitus.  Respiratory: Denies SOB, DOE, chest tightness, and wheezing.  Cardiovascular: Denies chest pain, palpitations and leg swelling.  Gastrointestinal:per HPI Genitourinary: Denies dysuria, urgency, frequency, hematuria, flank pain and  difficulty urinating.  Musculoskeletal: Denies myalgias, back pain, joint swelling, arthralgias and gait problem.  Skin: Denies pallor, rash and wound.  Neurological: Denies dizziness, seizures, syncope, weakness, lightheadedness, numbness and headaches.  Hematological: No s/s of bleeding  Past Medical History  Diagnosis Date  . 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 Hypertension   . 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 GERD (gastroesophageal reflux disease)     medication induced   Current Outpatient Prescriptions  Medication Sig Dispense Refill  . ACCU-CHEK FASTCLIX LANCETS MISC 1 each by Does not apply route 4 (four) times daily. Dx code- 250.00  204 each  12  . Alcohol Swabs (SM ALCOHOL PREP) 70 % PADS       . amLODipine (NORVASC) 10 MG tablet Take 10 mg by mouth daily.      . B-D ULTRAFINE III SHORT PEN 31G X 8 MM MISC       . Blood Glucose Monitoring Suppl (ACCU-CHEK NANO SMARTVIEW) W/DEVICE KIT 1 each by Does not apply route 4 (four) times daily.  1 kit  0  . cloNIDine (CATAPRES) 0.2 MG tablet Take 0.2 mg by mouth 3 (three) times daily.      . famotidine (PEPCID) 20 MG tablet 40 mg daily with  breakfast.      . glucose blood (ACCU-CHEK SMARTVIEW) test strip 3x/day before meals and bedtime.  Dx code 250.00  150 each  11  . insulin aspart (NOVOLOG FLEXPEN) 100 UNIT/ML injection Inject 13 Units into the skin 3 (three) times daily before meals.  1 vial  12  . labetalol (NORMODYNE) 300 MG tablet Take 300 mg by mouth 3 (three) times daily.      [name] LANTUS SOLOSTAR 100 UNIT/ML SOPN INJECT 60 UNITS INTO THE SKIN EVERY NIGHT AT BEDTIME  15 mL  11  . mycophenolate (MYFORTIC) 180 MG EC tablet Take 720 mg by  mouth 2 (two) times daily.      [name] NOVOLOG FLEXPEN 100 UNIT/ML SOPN FlexPen INJECT 13 UNITS INTO THE SKIN THREE TIMES DAILY WITH MEALS  15 mL  11  . predniSONE (DELTASONE) 5 MG tablet Take 5 mg by mouth daily.      . tacrolimus (PROGRAF) 1 MG capsule Take 4 mg by mouth 2 (two) times daily.      . tacrolimus (PROGRAF) 5 MG capsule Take 5 mg by mouth 2 (two) times daily.       No current facility-administered medications for this visit.   Family History  Problem Relation Age of Onset  . Hypertension Mother   . Diabetes Mother   . Atrial fibrillation Father   . Hypertension Father   . Arthritis Father   . Heart failure Sister   . Hypertension Sister     all 4 sisters  . Hypertension Brother     both brothers  . Diabetes Brother    History   Social History  . Marital Status: Single    Spouse Name: N/A    Number of Children: N/A  . Years of Education: N/A   Social History Main Topics  . Smoking status: Current Every Day Smoker -- 0.20 packs/day    Types: Cigarettes  . Smokeless tobacco: None     Comment:   Smoking 1 pack every 3 days- 6.25.13. encouraged her to call quitline again and sign up for support calls.   . Alcohol Use: No  . Drug Use: No  . Sexual Activity: No   Other Topics Concern  . 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 [place] with 2 adult children, 1 dog in the house    Objective:  Physical Exam: Filed Vitals:   07/28/13 1448  BP: 155/92  Pulse: 66  Temp: 97.4 F (36.3 C)  TempSrc: Oral  Height: _0  (1.803 m)  Weight: 327 lb 6.4 oz (148.508 kg)  SpO2: 100%   HEENT: PERRL, EOMI, no scleral icterus Cardiac: RRR, no rubs, murmurs or gallops Pulm: clear to auscultation bilaterally, moving normal volumes of air Abd: soft, nontender,  nondistended, BS present Ext: warm and well perfused, no pedal edema Neuro: alert and oriented X3, cranial nerves II-XII grossly intact  Assessment & Plan:  Case and care discussed with Dr. Lynnae January.  Please see problem oriented charting for further details. Patient to return in 3 months for routine follow up.

## 2013-07-28 NOTE — Assessment & Plan Note (Signed)
Likely viral, resolving.

## 2013-07-28 NOTE — Assessment & Plan Note (Signed)
BP Readings from Last 3 Encounters:  07/28/13 155/92  03/25/13 146/91  11/23/12 136/88    Lab Results  Component Value Date   NA 143 11/08/2011   K 3.7 11/08/2011   CREATININE 1.61* 11/08/2011    Assessment: Blood pressure control: mildly elevated Progress toward BP goal:   unchanved Comments: acutely ill, recovering   Plan: Medications:  continue current medications - clonidine 0.2 TID, amlodipine 10, labetalol 300 tid, and losartan 50 daily was started by Dr. Arty Baumgartner at her last visit Educational resources provided: brochure;handout;video

## 2013-07-28 NOTE — Patient Instructions (Signed)
General Instructions: -Your blood pressure is a little high, but probably because your are feeling sick -You are doing great with your diabetes! Keep it up!!! -We are going to check your cholesterol today  Please be sure to bring all of your medications with you to every visit.  Should you have any new or worsening symptoms, please be sure to call the clinic at [phone].  Treatment Goals:  Goals (1 Years of Data) as of 07/28/13         As of Today 03/25/13 03/25/13 11/23/12 11/23/12     Blood Pressure    . Blood Pressure < 130/80  155/92 146/91 153/95 136/88 166/104     Lifestyle    . Plan meals- consistent carb between  40-60 grams per meal           Result Component    . HEMOGLOBIN A1C < 7.0  6.7 6.7  5.8     . LDL CALC < 100            Progress Toward Treatment Goals:  Treatment Goal 07/28/2013  Hemoglobin A1C at goal  Blood pressure -  Stop smoking smoking less    Self Care Goals & Plans:  Self Care Goal 07/28/2013  Manage my medications take my medicines as prescribed; bring my medications to every visit; refill my medications on time; follow the sick day instructions if I am sick  Monitor my health keep track of my blood glucose; bring my glucose meter and log to each visit; keep track of my blood pressure; keep track of my weight; check my feet daily  Eat healthy foods eat more vegetables; eat fruit for snacks and desserts; eat foods that are low in salt; eat baked foods instead of fried foods; eat smaller portions  Be physically active find an activity I enjoy  Stop smoking go to the [employer] (https://scott-booker.info/); call [facility] (1-800-QUIT-NOW)  Meeting treatment goals maintain the current self-care plan    Home Blood Glucose Monitoring 07/28/2013  Check my blood sugar 3 times a day  When to check my blood sugar before breakfast; at bedtime; before dinner     Care Management & Community Referrals:  Referral 08/12/2012  Referrals made for care management  support diabetes educator

## 2013-07-28 NOTE — Assessment & Plan Note (Addendum)
Lab Results  Component Value Date   HGBA1C 6.7 07/28/2013   HGBA1C 6.7 03/25/2013   HGBA1C 5.8 11/23/2012     Assessment: Diabetes control: good control (HgbA1C at goal) Progress toward A1C goal:  at goal  Plan: Medications:  continue current medications - lantus 60 qHS, novolog 13 TIDWC Home glucose monitoring: Frequency: 3 times a day Timing: before breakfast;at bedtime;before dinner Instruction/counseling given: reminded to bring blood glucose meter & log to each visit and reminded to bring medications to each visit Educational resources provided: brochure;handout Lipid profile today

## 2013-07-29 LAB — LIPID PANEL
CHOLESTEROL: 146 mg/dL (ref 0–200)
HDL: 40 mg/dL (ref >39)
LDL Cholesterol: 78 mg/dL (ref 0–99)
Total CHOL/HDL Ratio: 3.7 Ratio
Triglycerides: 140 mg/dL (ref <150)
VLDL: 28 mg/dL (ref 0–40)

## 2013-07-29 NOTE — Progress Notes (Signed)
Case discussed with Dr. Sharda soon after the resident saw the patient.  We reviewed the resident's history and exam and pertinent patient test results.  I agree with the assessment, diagnosis, and plan of care documented in the resident's note. 

## 2013-08-19 DIAGNOSIS — D631 Anemia in chronic kidney disease: Secondary | ICD-10-CM | POA: Diagnosis not present

## 2013-08-19 DIAGNOSIS — N189 Chronic kidney disease, unspecified: Secondary | ICD-10-CM | POA: Diagnosis not present

## 2013-08-19 DIAGNOSIS — Z1231 Encounter for screening mammogram for malignant neoplasm of breast: Secondary | ICD-10-CM | POA: Diagnosis not present

## 2013-08-19 DIAGNOSIS — Z940 Kidney transplant status: Secondary | ICD-10-CM | POA: Diagnosis not present

## 2013-09-01 DIAGNOSIS — R809 Proteinuria, unspecified: Secondary | ICD-10-CM | POA: Diagnosis not present

## 2013-09-01 DIAGNOSIS — I129 Hypertensive chronic kidney disease with stage 1 through stage 4 chronic kidney disease, or unspecified chronic kidney disease: Secondary | ICD-10-CM | POA: Diagnosis not present

## 2013-09-01 DIAGNOSIS — D631 Anemia in chronic kidney disease: Secondary | ICD-10-CM | POA: Diagnosis not present

## 2013-09-01 DIAGNOSIS — Z940 Kidney transplant status: Secondary | ICD-10-CM | POA: Diagnosis not present

## 2013-09-20 DIAGNOSIS — N189 Chronic kidney disease, unspecified: Secondary | ICD-10-CM | POA: Diagnosis not present

## 2013-09-20 DIAGNOSIS — Z940 Kidney transplant status: Secondary | ICD-10-CM | POA: Diagnosis not present

## 2013-09-20 DIAGNOSIS — Z79899 Other long term (current) drug therapy: Secondary | ICD-10-CM | POA: Diagnosis not present

## 2013-09-20 DIAGNOSIS — D631 Anemia in chronic kidney disease: Secondary | ICD-10-CM | POA: Diagnosis not present

## 2013-09-20 DIAGNOSIS — N039 Chronic nephritic syndrome with unspecified morphologic changes: Secondary | ICD-10-CM | POA: Diagnosis not present

## 2013-11-08 DIAGNOSIS — N039 Chronic nephritic syndrome with unspecified morphologic changes: Secondary | ICD-10-CM | POA: Diagnosis not present

## 2013-11-08 DIAGNOSIS — Z940 Kidney transplant status: Secondary | ICD-10-CM | POA: Diagnosis not present

## 2013-11-08 DIAGNOSIS — D631 Anemia in chronic kidney disease: Secondary | ICD-10-CM | POA: Diagnosis not present

## 2013-11-16 ENCOUNTER — Ambulatory Visit (INDEPENDENT_AMBULATORY_CARE_PROVIDER_SITE_OTHER): Payer: Medicare Other | Admitting: Internal Medicine

## 2013-11-16 ENCOUNTER — Encounter: Payer: Self-pay | Admitting: Internal Medicine

## 2013-11-16 VITALS — BP 145/90 | HR 78 | Temp 98.0°F | Wt 325.3 lb

## 2013-11-16 DIAGNOSIS — Z720 Tobacco use: Secondary | ICD-10-CM

## 2013-11-16 DIAGNOSIS — E119 Type 2 diabetes mellitus without complications: Secondary | ICD-10-CM

## 2013-11-16 DIAGNOSIS — F17200 Nicotine dependence, unspecified, uncomplicated: Secondary | ICD-10-CM | POA: Diagnosis not present

## 2013-11-16 DIAGNOSIS — I10 Essential (primary) hypertension: Secondary | ICD-10-CM | POA: Diagnosis not present

## 2013-11-16 LAB — POCT GLYCOSYLATED HEMOGLOBIN (HGB A1C): HEMOGLOBIN A1C: 6.4

## 2013-11-16 LAB — GLUCOSE, CAPILLARY: Glucose-Capillary: 169 mg/dL — ABNORMAL HIGH (ref 70–99)

## 2013-11-16 NOTE — Assessment & Plan Note (Signed)
BP Readings from Last 3 Encounters:  11/16/13 145/90  07/28/13 155/92  03/25/13 146/91    Lab Results  Component Value Date   NA 143 11/08/2011   K 3.7 11/08/2011   CREATININE 1.61* 11/08/2011    Assessment: Blood pressure control: mildly elevated Progress toward BP goal:  improved  Plan: Medications:  continue current medications - clonidine 0.2 TID, amlodipine 10 daily, labetalol 300 TID, losartan 50 daily - followed by Dr. Arty Baumgartner, labs checked last week by [facility] Educational resources provided: brochure Self management tools provided: home blood pressure logbook

## 2013-11-16 NOTE — Assessment & Plan Note (Addendum)
Lab Results  Component Value Date   HGBA1C 6.4 11/16/2013   HGBA1C 6.7 07/28/2013   HGBA1C 6.7 03/25/2013     Assessment: Diabetes control: good control (HgbA1C at goal) Progress toward A1C goal:  at goal  Plan: Medications:  continue current medications - lantus 60 qUS, novolog 13u TIDWC Home glucose monitoring: Frequency: 2 times a day Timing: before breakfast;at bedtime Instruction/counseling given: reminded to get eye exam, reminded to bring blood glucose meter & log to each visit, reminded to bring medications to each visit, discussed the need for weight loss and discussed diet Educational resources provided: brochure Self management tools provided: copy of home glucose meter download;home glucose logbook -Schedule eye exam

## 2013-11-16 NOTE — Progress Notes (Signed)
Subjective:   Patient ID: [name] female   DOB: 06-16-1966 48 y.o.   MRN: [id number]  Chief Complaint  Patient presents with  . Diabetes    HPI: [name] is a 48 y.o. woman with history of renal transplant (follows at [place] with Dr. Marval Regal), hypertension, and diabetes  She is here for followup regarding her diabetes, which was diagnosed in May 2013, likely related to both her weight and tacrolimus. She checks her blood sugars about 3 times per day (or more if needed), unable to DL meter here today.  CBGs ranging 100s-300s (only once that high).  Novolog 13 TIDWC and lantus 60u qHS.    No recent symptoms of hypoglycemia including palpitations, dizziness, lightheadedness (no low CBGs). Does feel very thirsty but not often.  No significant polyuria/polyphagia/polydipsia.   Weight loss 343--> 337 --> 327 --> 325 Breakfast: cereal, eggs, toast (alternate with oatmeal) Lunch: prior night's left overs, sandwhich, rotis chicken Dinner: pork chops, fish (sometimes fried), bake/broil meats  Eye exam --> plans to change opthalmology MD  Next month appt with Dr. Arty Baumgartner.  Had labs done last week by him.  Review of Systems: Constitutional: Denies fever, chills, diaphoresis, appetite change and fatigue.  HEENT: Denies photophobia, eye pain, redness, hearing loss, ear pain, sore throat, sneezing, mouth sores, trouble swallowing, neck pain, neck stiffness and tinnitus. +sinuses act up occ Respiratory: Denies SOB, DOE, cough, chest tightness, and wheezing.  Cardiovascular: Denies chest pain, palpitations and leg swelling.  Gastrointestinal: Denies nausea, vomiting, abdominal pain, diarrhea, constipation,blood in stool and abdominal distention.  Genitourinary: Denies dysuria, urgency, frequency, hematuria, flank pain and difficulty urinating.  Musculoskeletal: Denies myalgias, back pain, joint swelling, arthralgias and gait problem.  Skin: Denies pallor, rash and wound.    Neurological: Denies dizziness, seizures, syncope, weakness, lightheadedness, numbness and headaches.     Past Medical History  Diagnosis Date  . 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 Hypertension   . 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 GERD (gastroesophageal reflux disease)     medication induced   Current Outpatient Prescriptions  Medication Sig Dispense Refill  . ACCU-CHEK FASTCLIX LANCETS MISC 1 each by Does not apply route 4 (four) times daily. Dx code- 250.00  204 each  12  . Alcohol Swabs (SM ALCOHOL PREP) 70 % PADS       . amLODipine (NORVASC) 10 MG tablet Take 10 mg by mouth daily.      . B-D ULTRAFINE III SHORT PEN 31G X 8 MM MISC       . Blood Glucose Monitoring Suppl (ACCU-CHEK NANO SMARTVIEW) W/DEVICE KIT 1 each by Does not apply route 4 (four) times daily.  1 kit  0  . cloNIDine (CATAPRES) 0.2 MG tablet Take 0.2 mg by mouth 3 (three) times daily.      . famotidine (PEPCID) 20 MG tablet 40 mg daily with breakfast.      . glucose blood (ACCU-CHEK SMARTVIEW) test strip 3x/day before meals and bedtime.  Dx code 250.00  150 each  11  . insulin aspart (NOVOLOG FLEXPEN) 100 UNIT/ML injection Inject 13 Units into the skin 3 (three) times daily before meals.  1 vial  12  . labetalol (NORMODYNE) 300 MG tablet Take 300 mg by mouth 3 (three) times daily.      [name] LANTUS SOLOSTAR 100 UNIT/ML SOPN INJECT 60 UNITS INTO THE SKIN EVERY NIGHT AT BEDTIME  15 mL  11  . losartan (COZAAR) 50 MG tablet       . mycophenolate (MYFORTIC) 180 MG EC tablet Take 720 mg by mouth 2 (two) times daily.      [name] NOVOLOG FLEXPEN 100 UNIT/ML SOPN FlexPen INJECT 13 UNITS INTO THE SKIN THREE TIMES DAILY WITH MEALS  15 mL   11  . predniSONE (DELTASONE) 5 MG tablet Take 5 mg by mouth daily.      . tacrolimus (PROGRAF) 1 MG capsule Take 4 mg by mouth 2 (two) times daily.      . tacrolimus (PROGRAF) 5 MG capsule Take 5 mg by mouth 2 (two) times daily.       No current facility-administered medications for this visit.   Family History  Problem Relation Age of Onset  . Hypertension Mother   . Diabetes Mother   . Atrial fibrillation Father   . Hypertension Father   . Arthritis Father   . Heart failure Sister   . Hypertension Sister     all 4 sisters  . Hypertension Brother     both brothers  . Diabetes Brother    History   Social History  . Marital Status: Single    Spouse Name: N/A    Number of Children: N/A  . Years of Education: N/A   Social History Main Topics  . Smoking status: Current Every Day Smoker -- 0.20 packs/day    Types: Cigarettes  . Smokeless tobacco: Not on file     Comment:   Smoking 1 pack every 3 days- 6.25.13. encouraged her to call quitline again and sign up for support calls.   . Alcohol Use: No  . Drug Use: No  . Sexual Activity: No   Other Topics Concern  . 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, 1 dog in the house    Objective:  Physical Exam: Filed Vitals:   11/16/13 1023  BP: 145/90  Pulse: 78  Temp: 98 F (36.7 C)  TempSrc: Oral  Weight: 325 lb 4.8 oz (147.555 kg)  SpO2: 98%   General: no distress, appears as stated age HEENT: PERRL, EOMI, no scleral icterus Cardiac: RRR, no rubs, murmurs or gallops Pulm: clear to auscultation bilaterally, moving normal volumes of air Abd: soft, nontender, nondistended, BS present, obese Ext: warm and well perfused, no pedal edema Neuro: alert and oriented X3,  cranial nerves II-XII grossly intact, 5/5 b/l UE & LE strength  Assessment & Plan:  Case and care discussed with Dr. Marinda Elk.  Please see problem oriented charting for further details. Patient to return in 3 months for routine DM follow up.

## 2013-11-16 NOTE — Assessment & Plan Note (Signed)
  Assessment: Progress toward smoking cessation:  smoking more Barriers to progress toward smoking cessation:  stress  Plan: Instruction/counseling given:  I counseled patient on the dangers of tobacco use, advised patient to stop smoking, and reviewed strategies to maximize success. Educational resources provided:  [facility] Insurance account manager) brochure Self management tools provided:  smoking cessation plan (STAR Quit Plan) Medications to assist with smoking cessation:  None Patient agreed to the following self-care plans for smoking cessation: go to the [employer] (www.quitlinenc.com);call [facility] (1-800-QUIT-NOW);cut down the number of cigarettes smoked

## 2013-11-16 NOTE — Patient Instructions (Signed)
General Instructions: -You are doing GREAT with your diabetes - keep it up!!!  No changes to your regimen today.  -Your blood pressure is higher than I like today, but we will let Dr. Arty Baumgartner make changes to your medications next month.  -Be sure to schedule your eye exam as soon as possible and have results sent to [place]  Please try to bring all your medicines next time. This will help [place] keep you safe from mistakes.  Should you have any new or worsening symptoms, please be sure to call the clinic at [phone].    Progress Toward Treatment Goals:  Treatment Goal 11/16/2013  Hemoglobin A1C at goal  Blood pressure improved  Stop smoking smoking more    Self Care Goals & Plans:  Self Care Goal 11/16/2013  Manage my medications take my medicines as prescribed; refill my medications on time; bring my medications to every visit  Monitor my health keep track of my blood glucose; bring my glucose meter and log to each visit; keep track of my weight; check my feet daily  Eat healthy foods drink diet soda or water instead of juice or soda; eat foods that are low in salt; eat baked foods instead of fried foods  Be physically active find an activity I enjoy  Stop smoking go to the [employer] (https://scott-booker.info/); call [facility] (1-800-QUIT-NOW); cut down the number of cigarettes smoked  Meeting treatment goals maintain the current self-care plan    Home Blood Glucose Monitoring 11/16/2013  Check my blood sugar 2 times a day  When to check my blood sugar before breakfast; at bedtime     Care Management & Community Referrals:  Referral 08/12/2012  Referrals made for care management support diabetes educator

## 2013-11-17 NOTE — Progress Notes (Signed)
Case discussed with Dr. Sharda soon after the resident saw the patient.  We reviewed the resident's history and exam and pertinent patient test results.  I agree with the assessment, diagnosis, and plan of care documented in the resident's note. 

## 2013-11-20 ENCOUNTER — Encounter (HOSPITAL_COMMUNITY): Payer: Self-pay | Admitting: Emergency Medicine

## 2013-11-20 DIAGNOSIS — IMO0002 Reserved for concepts with insufficient information to code with codable children: Secondary | ICD-10-CM | POA: Diagnosis not present

## 2013-11-20 DIAGNOSIS — S93409A Sprain of unspecified ligament of unspecified ankle, initial encounter: Secondary | ICD-10-CM | POA: Diagnosis not present

## 2013-11-20 DIAGNOSIS — Y939 Activity, unspecified: Secondary | ICD-10-CM | POA: Insufficient documentation

## 2013-11-20 DIAGNOSIS — Z940 Kidney transplant status: Secondary | ICD-10-CM | POA: Diagnosis not present

## 2013-11-20 DIAGNOSIS — Z794 Long term (current) use of insulin: Secondary | ICD-10-CM | POA: Diagnosis not present

## 2013-11-20 DIAGNOSIS — Z8719 Personal history of other diseases of the digestive system: Secondary | ICD-10-CM | POA: Insufficient documentation

## 2013-11-20 DIAGNOSIS — X500XXA Overexertion from strenuous movement or load, initial encounter: Secondary | ICD-10-CM | POA: Insufficient documentation

## 2013-11-20 DIAGNOSIS — Z79899 Other long term (current) drug therapy: Secondary | ICD-10-CM | POA: Diagnosis not present

## 2013-11-20 DIAGNOSIS — S99919A Unspecified injury of unspecified ankle, initial encounter: Secondary | ICD-10-CM | POA: Diagnosis not present

## 2013-11-20 DIAGNOSIS — M25579 Pain in unspecified ankle and joints of unspecified foot: Secondary | ICD-10-CM | POA: Diagnosis not present

## 2013-11-20 DIAGNOSIS — E119 Type 2 diabetes mellitus without complications: Secondary | ICD-10-CM | POA: Insufficient documentation

## 2013-11-20 DIAGNOSIS — I10 Essential (primary) hypertension: Secondary | ICD-10-CM | POA: Diagnosis not present

## 2013-11-20 DIAGNOSIS — F17200 Nicotine dependence, unspecified, uncomplicated: Secondary | ICD-10-CM | POA: Insufficient documentation

## 2013-11-20 DIAGNOSIS — Y929 Unspecified place or not applicable: Secondary | ICD-10-CM | POA: Insufficient documentation

## 2013-11-20 DIAGNOSIS — S8990XA Unspecified injury of unspecified lower leg, initial encounter: Secondary | ICD-10-CM | POA: Diagnosis not present

## 2013-11-20 NOTE — ED Notes (Signed)
Pt c/o left ankle pain. Pt fell tripping on her new puppy's toys. Pt denies hitting head, LOC. Ankle is swollen, no deformity noted. CMS intact

## 2013-11-21 ENCOUNTER — Emergency Department (HOSPITAL_COMMUNITY): Payer: Medicare Other

## 2013-11-21 ENCOUNTER — Emergency Department (HOSPITAL_COMMUNITY)
Admission: EM | Admit: 2013-11-21 | Discharge: 2013-11-21 | Disposition: A | Discharge status: Home / Self Care | Payer: Medicare Other | Attending: Emergency Medicine | Admitting: Emergency Medicine

## 2013-11-21 DIAGNOSIS — S93402A Sprain of unspecified ligament of left ankle, initial encounter: Secondary | ICD-10-CM

## 2013-11-21 DIAGNOSIS — S99919A Unspecified injury of unspecified ankle, initial encounter: Secondary | ICD-10-CM | POA: Diagnosis not present

## 2013-11-21 DIAGNOSIS — M25579 Pain in unspecified ankle and joints of unspecified foot: Secondary | ICD-10-CM | POA: Diagnosis not present

## 2013-11-21 DIAGNOSIS — S8990XA Unspecified injury of unspecified lower leg, initial encounter: Secondary | ICD-10-CM | POA: Diagnosis not present

## 2013-11-21 MED ORDER — HYDROCODONE-ACETAMINOPHEN 5-325 MG PO TABS: 1.0000 | ORAL_TABLET | ORAL | Status: DC | PRN

## 2013-11-21 MED ORDER — HYDROCODONE-ACETAMINOPHEN 5-325 MG PO TABS
2.0000 | ORAL_TABLET | Freq: Once | ORAL | Status: AC
  Administered 2013-11-21: 2 via ORAL
  Filled 2013-11-21: qty 2

## 2013-11-21 NOTE — ED Provider Notes (Signed)
CSN: [id number]     Arrival date & time 11/20/13  2348 History   First MD Initiated Contact with Patient 11/21/13 0035     Chief Complaint  Patient presents with  . Ankle Pain  . Fall     (Consider location/radiation/quality/duration/timing/severity/associated sxs/prior Treatment) HPI Comments: Patient is a 48 year old female who presents with left ankle pain that started prior to arrival. The mechanism of injury was sudden ankle inversion. Patient reports hearing a "pop" sudden onset of thorbbing, severe pain that is localized to left ankle. Patient reports progressive worsening of pain. Ankle movement and weight bearing activity make the pain worse. Nothing makes the pain better. Patient reports associated swelling. Patient has not tried anything for pain relief. Patient denies obvious deformity, numbness/tingling, coolness/weakness of extremity, bruising, and any other injury.     Patient is a 48 y.o. female presenting with ankle pain and fall.  Ankle Pain Associated symptoms: no fatigue, no fever and no neck pain   Fall Associated symptoms include arthralgias and joint swelling. Pertinent negatives include no abdominal pain, chest pain, chills, fatigue, fever, nausea, neck pain, vomiting or weakness.    Past Medical History  Diagnosis Date  . 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 Hypertension   . 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 GERD (gastroesophageal reflux disease)     medication induced   Past Surgical History  Procedure Laterality Date  . Kidney transplant    . Dg av dialysis  shunt access exist*l* or    . Cholecystectomy    . Umbilical hernia repair    . Cesarean section  1991,  1993   Family History  Problem Relation Age of Onset  . Hypertension Mother   . Diabetes Mother   . Atrial fibrillation Father   . Hypertension Father   . Arthritis Father   . Heart failure Sister   . Hypertension Sister     all 4 sisters  . Hypertension Brother     both brothers  . Diabetes Brother    History  Substance Use Topics  . Smoking status: Current Every Day Smoker -- 0.20 packs/day    Types: Cigarettes  . Smokeless tobacco: Not on file     Comment:   Smoking 1 pack every 3 days- 6.25.13. encouraged her to call quitline again and sign up for support calls.   . Alcohol Use: No   OB History   Grav Para Term Preterm Abortions TAB SAB Ect Mult Living                 Review of Systems  Constitutional: Negative for fever, chills and fatigue.  HENT: Negative for trouble swallowing.   Eyes: Negative for visual disturbance.  Respiratory: Negative for shortness of breath.   Cardiovascular: Negative for chest pain and palpitations.  Gastrointestinal: Negative for nausea, vomiting, abdominal pain and diarrhea.  Genitourinary: Negative for dysuria and difficulty urinating.  Musculoskeletal: Positive for arthralgias and joint swelling. Negative for neck pain.  Skin: Negative for color change.  Neurological: Negative for dizziness and weakness.  Psychiatric/Behavioral: Negative for dysphoric mood.      Allergies  Ancef; Infed; and Other  Home  Medications   Prior to Admission medications   Medication Sig Start Date End Date Taking? Authorizing Provider  ACCU-CHEK FASTCLIX LANCETS MISC 1 each by Does not apply route 4 (four) times daily. Dx code- 250.00 12/20/11   Othella Boyer, MD  Alcohol Swabs (SM ALCOHOL PREP) 70 % PADS  10/28/12   Historical Provider, MD  amLODipine (NORVASC) 10 MG tablet Take 10 mg by mouth daily.    Historical Provider, MD  B-D ULTRAFINE III SHORT PEN 31G X 8 MM MISC  11/09/12   Historical Provider, MD  Blood Glucose Monitoring Suppl (ACCU-CHEK  NANO SMARTVIEW) W/DEVICE KIT 1 each by Does not apply route 4 (four) times daily. 12/20/11   Milta Deiters, MD  cloNIDine (CATAPRES) 0.2 MG tablet Take 0.2 mg by mouth 3 (three) times daily.    Historical Provider, MD  famotidine (PEPCID) 20 MG tablet 40 mg daily with breakfast. 03/18/13   Historical Provider, MD  glucose blood (ACCU-CHEK SMARTVIEW) test strip 3x/day before meals and bedtime.  Dx code 250.00 08/04/12   Bartholomew Crews, MD  insulin aspart (NOVOLOG FLEXPEN) 100 UNIT/ML injection Inject 13 Units into the skin 3 (three) times daily before meals. 12/27/11 12/26/12  Othella Boyer, MD  labetalol (NORMODYNE) 300 MG tablet Take 300 mg by mouth 3 (three) times daily.    Historical Provider, MD  LANTUS SOLOSTAR 100 UNIT/ML SOPN INJECT 60 UNITS INTO THE SKIN EVERY NIGHT AT BEDTIME 03/01/13   Othella Boyer, MD  losartan (COZAAR) 50 MG tablet  07/22/13   Historical Provider, MD  mycophenolate (MYFORTIC) 180 MG EC tablet Take 720 mg by mouth 2 (two) times daily.    Historical Provider, MD  NOVOLOG FLEXPEN 100 UNIT/ML SOPN FlexPen INJECT 13 UNITS INTO THE SKIN THREE TIMES DAILY WITH MEALS 12/30/12   Neema Bobbie Stack, MD  predniSONE (DELTASONE) 5 MG tablet Take 5 mg by mouth daily.    Historical Provider, MD  tacrolimus (PROGRAF) 1 MG capsule Take 4 mg by mouth 2 (two) times daily.    Historical Provider, MD  tacrolimus (PROGRAF) 5 MG capsule Take 5 mg by mouth 2 (two) times daily.    Historical Provider, MD   BP 172/84  Pulse 70  Temp(Src) 98.1 F (36.7 C) (Oral)  Resp 20  Ht 6' (1.829 m)  Wt 323 lb (146.512 kg)  BMI 43.80 kg/m2  SpO2 100%  LMP 09/25/2012 Physical Exam  Nursing note and vitals reviewed. Constitutional: She is oriented to person, place, and time. She appears well-developed and well-nourished. No distress.  HENT:  Head: Normocephalic and atraumatic.  Eyes: Conjunctivae and EOM are normal.  Neck: Normal range of motion.  Cardiovascular: Normal rate, regular rhythm and  intact distal pulses.  Exam reveals no gallop and no friction rub.   No murmur heard. Pulmonary/Chest: Effort normal and breath sounds normal. She has no wheezes. She has no rales. She exhibits no tenderness.  Musculoskeletal:  Medial and lateral malleolar edema of the left ankle. No obvious deformity. Limited ROM due to pain and edema.   Neurological: She is alert and oriented to person, place, and time. Coordination normal.  Left foot sensation intact. Speech is goal-oriented. Moves limbs without ataxia.   Skin: Skin is warm and dry.  Psychiatric: She has a normal mood and affect. Her behavior is normal.    ED Course  Procedures (including critical care time)  SPLINT APPLICATION Date/Time: 9:24 AM Authorized by: Alvina Chou Consent: Verbal consent obtained. Risks and benefits:  risks, benefits and alternatives were discussed Consent given by: patient Splint applied by: orthopedic technician Location details: left ankle Splint type: ASO ankle splint Supplies used: ASO ankle splint Post-procedure: The splinted body part was neurovascularly unchanged following the procedure. Patient tolerance: Patient tolerated the procedure well with no immediate complications.     Labs Review Labs Reviewed - No data to display  Imaging Review Dg Ankle Complete Left  11/21/2013   CLINICAL DATA:  Diffuse left ankle pain following an injury tonight.  EXAM: LEFT ANKLE COMPLETE - 3+ VIEW  COMPARISON:  None.  FINDINGS: Diffuse soft tissue swelling, most pronounced laterally. Moderate size calcaneal spurs. Distal medial and lateral malleolus spurs. No fracture or dislocation seen. Probable effusion.  IMPRESSION: Degenerative changes and probable effusion.  No fracture.   Electronically Signed   By: Enrique Sack M.D.   On: 11/21/2013 00:39     EKG Interpretation None      MDM   Final diagnoses:  Left ankle sprain    1:14 AM No acute fracture seen on xray. Patient likely has a sprain. No  neurovascular compromise. Patient instructed to rest, ice and elevate the ankle. Patient will have ASO splint and crutches. Patient will follow up with Orthopedics if symptoms do not improve.    Alvina Chou, PA-C 11/21/13 0121

## 2013-11-21 NOTE — Discharge Instructions (Signed)
Take Vicodin as needed for pain. Use crutches as needed. Wear ankle splint as needed for support. Follow up with the Orthopedic surgeon if symptoms do not improve in 2 weeks.

## 2013-11-26 NOTE — ED Provider Notes (Signed)
Medical screening examination/treatment/procedure(s) were performed by non-physician practitioner and as supervising physician I was immediately available for consultation/collaboration.   EKG Interpretation None        Elyn Peers, MD 11/26/13 2055

## 2013-12-13 DIAGNOSIS — D631 Anemia in chronic kidney disease: Secondary | ICD-10-CM | POA: Diagnosis not present

## 2013-12-13 DIAGNOSIS — M25579 Pain in unspecified ankle and joints of unspecified foot: Secondary | ICD-10-CM | POA: Diagnosis not present

## 2013-12-13 DIAGNOSIS — Z940 Kidney transplant status: Secondary | ICD-10-CM | POA: Diagnosis not present

## 2013-12-26 LAB — HM MAMMOGRAPHY

## 2013-12-28 ENCOUNTER — Ambulatory Visit: Payer: Medicare Other | Attending: Orthopedic Surgery | Admitting: Physical Therapy

## 2013-12-28 DIAGNOSIS — M25579 Pain in unspecified ankle and joints of unspecified foot: Secondary | ICD-10-CM | POA: Insufficient documentation

## 2013-12-28 DIAGNOSIS — R609 Edema, unspecified: Secondary | ICD-10-CM | POA: Insufficient documentation

## 2013-12-28 DIAGNOSIS — IMO0001 Reserved for inherently not codable concepts without codable children: Secondary | ICD-10-CM | POA: Diagnosis not present

## 2013-12-28 DIAGNOSIS — M25676 Stiffness of unspecified foot, not elsewhere classified: Secondary | ICD-10-CM | POA: Insufficient documentation

## 2013-12-28 DIAGNOSIS — M25673 Stiffness of unspecified ankle, not elsewhere classified: Secondary | ICD-10-CM | POA: Insufficient documentation

## 2014-01-03 ENCOUNTER — Ambulatory Visit: Payer: Medicare Other | Admitting: Physical Therapy

## 2014-01-04 ENCOUNTER — Ambulatory Visit: Payer: Medicare Other | Admitting: Physical Therapy

## 2014-01-06 ENCOUNTER — Ambulatory Visit: Payer: Medicare Other | Attending: Orthopedic Surgery | Admitting: Physical Therapy

## 2014-01-06 DIAGNOSIS — R609 Edema, unspecified: Secondary | ICD-10-CM | POA: Diagnosis not present

## 2014-01-06 DIAGNOSIS — M25676 Stiffness of unspecified foot, not elsewhere classified: Secondary | ICD-10-CM | POA: Insufficient documentation

## 2014-01-06 DIAGNOSIS — M25579 Pain in unspecified ankle and joints of unspecified foot: Secondary | ICD-10-CM | POA: Insufficient documentation

## 2014-01-06 DIAGNOSIS — IMO0001 Reserved for inherently not codable concepts without codable children: Secondary | ICD-10-CM | POA: Diagnosis not present

## 2014-01-06 DIAGNOSIS — M25673 Stiffness of unspecified ankle, not elsewhere classified: Secondary | ICD-10-CM | POA: Insufficient documentation

## 2014-01-11 ENCOUNTER — Ambulatory Visit: Payer: Medicare Other | Admitting: Physical Therapy

## 2014-01-11 DIAGNOSIS — IMO0001 Reserved for inherently not codable concepts without codable children: Secondary | ICD-10-CM | POA: Diagnosis not present

## 2014-01-13 ENCOUNTER — Ambulatory Visit: Payer: Medicare Other | Admitting: Physical Therapy

## 2014-01-13 DIAGNOSIS — IMO0001 Reserved for inherently not codable concepts without codable children: Secondary | ICD-10-CM | POA: Diagnosis not present

## 2014-01-18 ENCOUNTER — Ambulatory Visit: Payer: Medicare Other | Admitting: Physical Therapy

## 2014-01-18 DIAGNOSIS — IMO0001 Reserved for inherently not codable concepts without codable children: Secondary | ICD-10-CM | POA: Diagnosis not present

## 2014-01-20 ENCOUNTER — Ambulatory Visit: Payer: Medicare Other | Admitting: Physical Therapy

## 2014-01-20 DIAGNOSIS — IMO0001 Reserved for inherently not codable concepts without codable children: Secondary | ICD-10-CM | POA: Diagnosis not present

## 2014-02-02 ENCOUNTER — Other Ambulatory Visit: Payer: Self-pay | Admitting: *Deleted

## 2014-02-02 MED ORDER — INSULIN GLARGINE 100 UNIT/ML SOLOSTAR PEN: 60.0000 [IU] | PEN_INJECTOR | Freq: Every day | SUBCUTANEOUS | Status: DC

## 2014-02-02 NOTE — Telephone Encounter (Signed)
Please schedule a follow-up appointment with patient's PCP. 

## 2014-02-03 NOTE — Telephone Encounter (Signed)
Message sent to front desk

## 2014-02-16 ENCOUNTER — Telehealth: Payer: Self-pay | Admitting: Dietician

## 2014-02-16 DIAGNOSIS — E119 Type 2 diabetes mellitus without complications: Secondary | ICD-10-CM

## 2014-02-16 NOTE — Telephone Encounter (Signed)
Has not been to eye doctor yet this year, but when she goes( soon she said) she will have them fax [place] the results. She a,also request insulin pen needles, shorter preferably.

## 2014-02-17 DIAGNOSIS — Z940 Kidney transplant status: Secondary | ICD-10-CM | POA: Diagnosis not present

## 2014-02-17 DIAGNOSIS — D631 Anemia in chronic kidney disease: Secondary | ICD-10-CM | POA: Diagnosis not present

## 2014-02-17 DIAGNOSIS — R809 Proteinuria, unspecified: Secondary | ICD-10-CM | POA: Diagnosis not present

## 2014-02-17 DIAGNOSIS — I129 Hypertensive chronic kidney disease with stage 1 through stage 4 chronic kidney disease, or unspecified chronic kidney disease: Secondary | ICD-10-CM | POA: Diagnosis not present

## 2014-02-18 DIAGNOSIS — M25579 Pain in unspecified ankle and joints of unspecified foot: Secondary | ICD-10-CM | POA: Diagnosis not present

## 2014-02-18 MED ORDER — INSULIN PEN NEEDLE 31G X 5 MM MISC: Status: DC

## 2014-02-18 NOTE — Telephone Encounter (Signed)
Done, thank you!

## 2014-02-28 ENCOUNTER — Ambulatory Visit (INDEPENDENT_AMBULATORY_CARE_PROVIDER_SITE_OTHER): Payer: Medicare Other | Admitting: Internal Medicine

## 2014-02-28 ENCOUNTER — Encounter: Payer: Self-pay | Admitting: Internal Medicine

## 2014-02-28 VITALS — BP 143/81 | HR 73 | Temp 97.5°F | Ht 71.0 in | Wt 311.1 lb

## 2014-02-28 DIAGNOSIS — E119 Type 2 diabetes mellitus without complications: Secondary | ICD-10-CM

## 2014-02-28 DIAGNOSIS — I10 Essential (primary) hypertension: Secondary | ICD-10-CM | POA: Diagnosis not present

## 2014-02-28 DIAGNOSIS — F17200 Nicotine dependence, unspecified, uncomplicated: Secondary | ICD-10-CM | POA: Diagnosis not present

## 2014-02-28 LAB — URINALYSIS, ROUTINE W REFLEX MICROSCOPIC
Bilirubin Urine: NEGATIVE
Glucose, UA: >1000 mg/dL — AB
HGB URINE DIPSTICK: NEGATIVE
Ketones, ur: 15 mg/dL — AB
LEUKOCYTES UA: NEGATIVE
Nitrite: NEGATIVE
PH: 5 (ref 5.0–8.0)
Protein, ur: 100 mg/dL — AB
Specific Gravity, Urine: 1.036 — ABNORMAL HIGH (ref 1.005–1.030)
Urobilinogen, UA: 0.2 mg/dL (ref 0.0–1.0)

## 2014-02-28 LAB — CBC WITH DIFFERENTIAL/PLATELET
BASOS ABS: 0 10*3/uL (ref 0.0–0.1)
Basophils Relative: 0 % (ref 0–1)
Eosinophils Absolute: 0.2 10*3/uL (ref 0.0–0.7)
Eosinophils Relative: 6 % — ABNORMAL HIGH (ref 0–5)
HCT: 39.8 % (ref 36.0–46.0)
Hemoglobin: 13.7 g/dL (ref 12.0–15.0)
Lymphocytes Relative: 16 % (ref 12–46)
Lymphs Abs: 0.6 10*3/uL — ABNORMAL LOW (ref 0.7–4.0)
MCH: 28.6 pg (ref 26.0–34.0)
MCHC: 34.4 g/dL (ref 30.0–36.0)
MCV: 83.1 fL (ref 78.0–100.0)
Monocytes Absolute: 0.4 10*3/uL (ref 0.1–1.0)
Monocytes Relative: 10 % (ref 3–12)
Neutro Abs: 2.6 10*3/uL (ref 1.7–7.7)
Neutrophils Relative %: 68 % (ref 43–77)
PLATELETS: 205 10*3/uL (ref 150–400)
RBC: 4.79 MIL/uL (ref 3.87–5.11)
RDW: 14.5 % (ref 11.5–15.5)
WBC: 3.8 10*3/uL — ABNORMAL LOW (ref 4.0–10.5)

## 2014-02-28 LAB — BASIC METABOLIC PANEL WITH GFR
BUN: 14 mg/dL (ref 6–23)
CHLORIDE: 101 meq/L (ref 96–112)
CO2: 26 mEq/L (ref 19–32)
Calcium: 10.3 mg/dL (ref 8.4–10.5)
Creat: 1.43 mg/dL — ABNORMAL HIGH (ref 0.50–1.10)
GFR, Est African American: 49 mL/min — ABNORMAL LOW
GFR, Est Non African American: 43 mL/min — ABNORMAL LOW
Glucose, Bld: 397 mg/dL — ABNORMAL HIGH (ref 70–99)
POTASSIUM: 3.7 meq/L (ref 3.5–5.3)
Sodium: 142 mEq/L (ref 135–145)

## 2014-02-28 LAB — GLUCOSE, CAPILLARY: GLUCOSE-CAPILLARY: 581 mg/dL — AB (ref 70–99)

## 2014-02-28 LAB — URINALYSIS, MICROSCOPIC ONLY
CASTS: NONE SEEN
Crystals: NONE SEEN
RBC / HPF: NONE SEEN RBC/hpf (ref <3)

## 2014-02-28 LAB — POCT GLYCOSYLATED HEMOGLOBIN (HGB A1C): Hemoglobin A1C: >14

## 2014-02-28 NOTE — Progress Notes (Signed)
Subjective:     Patient ID: [name], female   DOB: [birth year], 48 y.o.   MRN: [id number]  HPI Pt is a 48 y/o female w/ PMH of kidney transplant, DM2 dx in 10/2011, and HTN who presents to clinic for diabetes follow up. She is currently on lantus 60 units qhs and novolog 13 units TID w/ meals. Today her CBG is 581 and her HbA1c is >14. Her previous HbA1c in 11/16/13 was 6.4. Pt weighs 311lbs down from 323 lbs in 11/20/13. Pt admits to missing her afternoon doses of insulin stating this is because she is usually not at home around that time. However, when she is at home she takes her insulin. She reports compliance with morning and evening doses of novolog as well as with nighttime dose of lantus. The lowest CBG reading she has had since starting insulin was 68. She cannot recall what her lowest reading has been over the past 2-3 months. She says that her highest glucose readings have been around the 240's and has never been as high has today's CBG of 581. This morning her CBG was 136 and she had her morning insulin. Her morning glucose readings are usually in the 130's but recently have been in the 200's. Pt drank a soda and ate a biscuit with jelly this morning.   Pt knows when she has high blood sugars and reports polydipsia x 1 day and polyuria x 2 days. She had blurry vision two weeks ago that has since resolved. Pt had a family reunion recently and her dog she had for 13 years was put to sleep and consequently she has been grieve eating. She states that she knows what she needs to go regarding her diabetes but recently has been "outside the box" 2/2 to the above reasons. She saw Dr. Servando Salina last Thursday and said that her blood labs were WNL at that time. Denies confusion, HA, abdominal pain, N/V,  recent illness, or hospitalizations. She does not drink alcohol, smokes 1/2 PPD of cigarettes. Pt has met with Barry Brunner in the past in 2013 to discuss diabetes regimen and diet.   Pt has not been on  any new medications other than losartan. She is on chronic prednisone and tacrolimus, both of which have not been changed from their original dose.     Review of Systems  Constitutional: Negative for fever, chills, activity change and fatigue.  Eyes: Positive for visual disturbance.  Respiratory: Negative for shortness of breath.   Cardiovascular: Negative for chest pain.  Gastrointestinal: Negative for abdominal pain.  Endocrine: Positive for polydipsia and polyuria.  Neurological: Negative for syncope, weakness, light-headedness and headaches.  Psychiatric/Behavioral: Negative for confusion.       Objective:   Physical Exam  Constitutional: She is oriented to person, place, and time. She appears well-developed and well-nourished. No distress.  Eyes: Pupils are equal, round, and reactive to light.  Cardiovascular: Normal rate and regular rhythm.   Pulmonary/Chest: Effort normal and breath sounds normal.  Abdominal: Soft. Bowel sounds are normal. There is no tenderness.  Neurological: She is alert and oriented to person, place, and time.       Assessment:     Please see problem based assessment and plan.      Plan:     Please see problem based assessment and plan.  Pt to return to clinic this Friday w/ glucometer for adjustment of insulin as needed.

## 2014-02-28 NOTE — Patient Instructions (Signed)
Please take your insulin as directed and check your blood sugar 4 times a day ( with meals and at bedtime)  It is VERY important to bring in your glucometer at your next appointment so that we can adjust your insulin.

## 2014-02-28 NOTE — Assessment & Plan Note (Signed)
Lab Results  Component Value Date   HGBA1C >14.0 02/28/2014   HGBA1C 6.4 11/16/2013   HGBA1C 6.7 07/28/2013     Assessment: Diabetes control:  not controlled Progress toward A1C goal:   deteriorated  Comments: Pt is on lantus 60U qhs and novolog 15units TID w/ meals. Pt reports noncompliance with lunch time insulin. She checks her sugars but did not bring her meter with her today.   Plan: Medications:  continue current medications Home glucose monitoring:  Frequency:  4 times a day Timing:  w/ meals and at night Instruction/counseling given: reminded to bring blood glucose meter & log to each visit, discussed diet and other instruction/counseling: pt educated on DKA and importance of keeping diabetes undercontrol to protect kidneys Educational resources provided: handout  Other plans: Pt to schedule meeting with Butch Penny to help with diet and help with management of insulin esp w/ afternoon insulin coverage. Pt instructed to take insulin as directed and check CBGs 4 times a day. She is to return to clinic on Friday with her glucometer so that we can adjust her insulin as needed. Held off on changing insulin at this time as we did not have a record of her blood sugars and patient was unable to recall lowest CBG reading, her lowest has been 68 since starting insulin. Will get labs from Dr. Geanie Berlin office who she saw last Thursday.   Also, today in clinic before discharge stat BMP revealed glucose of 397 w/ anion gap of 15. CBC was WNL. UA revealed 15 ketones.

## 2014-03-01 NOTE — Progress Notes (Signed)
Internal Medicine Clinic Attending Date of visit: 02/28/2014  I saw and evaluated the patient.  I personally confirmed the key portions of the history and exam documented by Dr. Hulen Luster and I reviewed pertinent patient test results.  The assessment, diagnosis, and plan were formulated together and I agree with the documentation in the resident's note.

## 2014-03-04 ENCOUNTER — Encounter: Payer: Self-pay | Admitting: Internal Medicine

## 2014-03-04 ENCOUNTER — Ambulatory Visit (INDEPENDENT_AMBULATORY_CARE_PROVIDER_SITE_OTHER): Payer: Medicare Other | Admitting: Internal Medicine

## 2014-03-04 VITALS — BP 169/94 | HR 73 | Temp 98.5°F | Wt 316.0 lb

## 2014-03-04 DIAGNOSIS — E119 Type 2 diabetes mellitus without complications: Secondary | ICD-10-CM | POA: Diagnosis not present

## 2014-03-04 DIAGNOSIS — Z23 Encounter for immunization: Secondary | ICD-10-CM | POA: Diagnosis not present

## 2014-03-04 DIAGNOSIS — F17200 Nicotine dependence, unspecified, uncomplicated: Secondary | ICD-10-CM | POA: Diagnosis not present

## 2014-03-04 DIAGNOSIS — IMO0001 Reserved for inherently not codable concepts without codable children: Secondary | ICD-10-CM

## 2014-03-04 DIAGNOSIS — I10 Essential (primary) hypertension: Secondary | ICD-10-CM | POA: Diagnosis not present

## 2014-03-04 DIAGNOSIS — IMO0002 Reserved for concepts with insufficient information to code with codable children: Secondary | ICD-10-CM

## 2014-03-04 DIAGNOSIS — E1165 Type 2 diabetes mellitus with hyperglycemia: Secondary | ICD-10-CM

## 2014-03-04 LAB — GLUCOSE, CAPILLARY: Glucose-Capillary: 412 mg/dL — ABNORMAL HIGH (ref 70–99)

## 2014-03-04 NOTE — Progress Notes (Signed)
Subjective:     Patient ID: [name], female   DOB: Aug 28, 1965, 48 y.o.   MRN: [id number]  HPI  Pt is a 48 y/o female w/ PMHx of DM2, kidney transplant, and HTN who presents today for DM f/u. She brought her glucometer in with blood sugars that range from 341-530. She reports compliance with all doses of her insulin regimen. She had a hyperglycemic episode on 8/25 in which her blood glucose was too high for the glucometer to read. She has cut down on her sugar intake and has not been grieve eating since Monday. For dinner last night she had a spinach salad w/ fried fish, and for lunch she has boiled chicken. Pt on lantus 60 units QHS and novolog 13 units TID w/ meals. She will sometimes increase novolog up to 15 units w/ meals depending on how big her meal will be. Denies any hypoglycemic symptoms. Pt on lifelong prednisone 5mg  and tacrolimus 9mg  BID, both doses of not been changed recently.   Pt's BP was 169/94 today in clinic, pt admits to not taking her morning BP meds but did take her insulin. Of note, she is on clonidine for BP.    Review of Systems  Constitutional: Negative for appetite change.  Gastrointestinal: Negative for nausea, vomiting and abdominal pain.  Endocrine: Negative for polydipsia and polyuria.  Neurological: Negative for light-headedness and headaches.  Psychiatric/Behavioral: Negative for confusion.       Objective:   Physical Exam  Constitutional: She appears well-developed and well-nourished.  Cardiovascular: Normal rate and regular rhythm.   Pulmonary/Chest: Effort normal and breath sounds normal.  Abdominal: Soft. Bowel sounds are normal.       Assessment:     Please see problem based assessment and plan.        Plan:     Please see problem based assessment and plan.

## 2014-03-04 NOTE — Patient Instructions (Signed)
General Instructions:  Start taking lantus 66 units at night and novolog 16 units three times a day with meals. If you notice your blood sugars are still in the 300's you will need to come back to clinic in one week, if your blood sugars are in the 200's we can wait until 2 weeks before you come to clinic. Make sure to check your blood sugars 4 times a day and take your insulin as directed. Also, if you sugars drop below 70 or if you noticed symptoms of hypoglycemia please call the clinic or go to the ED if your symptoms do not resolve after eating candy or drinking orange juice.   Thank you for bringing your medicines today. This helps [place] keep you safe from mistakes.  Self Care Goals & Plans:  Self Care Goal 02/28/2014  Manage my medications take my medicines as prescribed; bring my medications to every visit; refill my medications on time  Monitor my health check my feet daily; bring my glucose meter and log to each visit; keep track of my blood glucose  Eat healthy foods eat more vegetables; eat foods that are low in salt; eat baked foods instead of fried foods  Be physically active find an activity I enjoy  Stop smoking cut down the number of cigarettes smoked  Meeting treatment goals -    Home Blood Glucose Monitoring 11/16/2013  Check my blood sugar 2 times a day  When to check my blood sugar before breakfast; at bedtime     Care Management & Community Referrals:  Referral 08/12/2012  Referrals made for care management support diabetes educator

## 2014-03-05 MED ORDER — INSULIN ASPART 100 UNIT/ML FLEXPEN: PEN_INJECTOR | SUBCUTANEOUS | Status: DC

## 2014-03-05 MED ORDER — INSULIN GLARGINE 100 UNIT/ML SOLOSTAR PEN: 66.0000 [IU] | PEN_INJECTOR | Freq: Every day | SUBCUTANEOUS | Status: DC

## 2014-03-05 NOTE — Assessment & Plan Note (Signed)
BP Readings from Last 3 Encounters:  03/04/14 169/94  02/28/14 143/81  11/21/13 176/96    Lab Results  Component Value Date   NA 142 02/28/2014   K 3.7 02/28/2014   CREATININE 1.43* 02/28/2014    Assessment: Blood pressure control:  not controlled Progress toward BP goal:   deteriorated  Comments: Pt did not take her morning BP meds today including her clonidine.   Plan: Medications:  continue current medications Other plans: pt took her morning meds while in the exam room. Educated on rebound htn side effect of missing clonidine doses.

## 2014-03-05 NOTE — Assessment & Plan Note (Addendum)
Lab Results  Component Value Date   HGBA1C >14.0 02/28/2014   HGBA1C 6.4 11/16/2013   HGBA1C 6.7 07/28/2013     Assessment: Diabetes control:  uncontrolled Progress toward A1C goal:   deteriorated  Comments: Pt reports compliance w/ lantus 60 units QHS and novolog 13 units TID w/ meals  Plan: Medications:  increase lantus to 66 units at night, and increase novolog to 16 units TID w/ meals Instruction/counseling given: reminded to bring blood glucose meter & log to each visit Other plans: Will f/u in 2 weeks if CBGs are around 200's, however will f/u in 1 weeks if CBGs are still in the 300's. Will review log at next visit and adjust insulin as needed. Can consider adding glipizide in the future.

## 2014-03-08 NOTE — Progress Notes (Signed)
Internal Medicine Clinic Attending Date of visit: 03/04/2014  I saw and evaluated the patient.  I personally confirmed the key portions of the history and exam documented by Dr. Hulen Luster and I reviewed pertinent patient test results.  The assessment, diagnosis, and plan were formulated together and I agree with the documentation in the resident's note.

## 2014-03-16 ENCOUNTER — Ambulatory Visit (INDEPENDENT_AMBULATORY_CARE_PROVIDER_SITE_OTHER): Payer: Medicare Other | Admitting: Internal Medicine

## 2014-03-16 ENCOUNTER — Encounter: Payer: Self-pay | Admitting: Internal Medicine

## 2014-03-16 ENCOUNTER — Ambulatory Visit: Payer: Medicare Other | Admitting: Dietician

## 2014-03-16 VITALS — BP 159/50 | HR 72 | Temp 98.2°F | Wt 318.7 lb

## 2014-03-16 DIAGNOSIS — E1165 Type 2 diabetes mellitus with hyperglycemia: Secondary | ICD-10-CM | POA: Insufficient documentation

## 2014-03-16 DIAGNOSIS — I10 Essential (primary) hypertension: Secondary | ICD-10-CM | POA: Diagnosis not present

## 2014-03-16 DIAGNOSIS — IMO0001 Reserved for inherently not codable concepts without codable children: Secondary | ICD-10-CM | POA: Diagnosis not present

## 2014-03-16 DIAGNOSIS — IMO0002 Reserved for concepts with insufficient information to code with codable children: Secondary | ICD-10-CM

## 2014-03-16 DIAGNOSIS — E119 Type 2 diabetes mellitus without complications: Secondary | ICD-10-CM | POA: Diagnosis not present

## 2014-03-16 DIAGNOSIS — F17200 Nicotine dependence, unspecified, uncomplicated: Secondary | ICD-10-CM | POA: Diagnosis not present

## 2014-03-16 LAB — GLUCOSE, CAPILLARY: GLUCOSE-CAPILLARY: 178 mg/dL — AB (ref 70–99)

## 2014-03-16 MED ORDER — ACCU-CHEK NANO SMARTVIEW W/DEVICE KIT: PACK | Status: DC

## 2014-03-16 MED ORDER — INSULIN ASPART 100 UNIT/ML FLEXPEN: 20.0000 [IU] | PEN_INJECTOR | Freq: Three times a day (TID) | SUBCUTANEOUS | Status: DC

## 2014-03-16 MED ORDER — INSULIN GLARGINE 100 UNIT/ML SOLOSTAR PEN: 72.0000 [IU] | PEN_INJECTOR | Freq: Every day | SUBCUTANEOUS | Status: DC

## 2014-03-16 NOTE — Assessment & Plan Note (Deleted)
Lab Results  Component Value Date   HGBA1C >14.0 02/28/2014   HGBA1C 6.4 11/16/2013   HGBA1C 6.7 07/28/2013     Assessment: Diabetes control:  uncontrolled Progress toward A1C goal:   deteriorated  Comments: lantus was increased from 60 units to 66 units and novolog from 13 units to 16 units during her last visit on 8/28. Reports compliance with meds. Her glucometer log reveals sugars ranging from 74-597.   Plan: Medications:  increased lantus to 72 units qhs and novolog 20 units TID w/meals Instruction/counseling given: reminded to bring blood glucose meter & log to each visit Other plans: Butch Penny gave patient a new glucometer today. Given refills of lantus and novolog. Will f/u in 1 week.

## 2014-03-16 NOTE — Progress Notes (Signed)
Patient did not stay for visit. New sample Accu chek meter provided to patient today

## 2014-03-16 NOTE — Assessment & Plan Note (Signed)
BP Readings from Last 3 Encounters:  03/16/14 159/50  03/04/14 169/94  02/28/14 143/81    Lab Results  Component Value Date   NA 142 02/28/2014   K 3.7 02/28/2014   CREATININE 1.43* 02/28/2014    Assessment: Blood pressure control:  not well controlled Progress toward BP goal:   improving Comments: pt taking norvasc 10mg , labetalol 300mg  TID, clonidine 0.2mg  TID, and losartan 50mg , reports compliance. She missed her afternoon clonidine and labetalol 2/2 to public transportation.   Plan: Medications:  continue current medications Other plans: pt gets her refills from Dr. Marval Regal, does not need any at this time. Instructed patient to take BP meds as scheduled and before she comes to her clinic appointments even if she does not eat. Will recheck at her next visit in 1 week.

## 2014-03-16 NOTE — Addendum Note (Signed)
Addended by: Norman Herrlich on: 03/16/2014 04:13 PM   Modules accepted: Orders, Level of Service

## 2014-03-16 NOTE — Progress Notes (Signed)
Subjective:     Patient ID: [name], female   DOB: 1965/12/03, 48 y.o.   MRN: [id number]  HPI Pt is a 48 y/o female w/ PMHx of DM2, HTN, and kidney transplant on chronic steroids who presents to clinic for diabetes f/u. Her lantus was increased from 60 units to 66 units and novolog from 13 units to 16 units during her last visit on 8/28. Last HbA1c on 8/24 was >14. She reports compliance with her medications. Her glucometer report ranges from 74-597. She believes that her glucometer is broken and does not report all of her glucose readings. Pt knows when she gets hypoglycemic b/c she will get sleepy, she feels that CBGs in the 120's are low for her. She knows to drink juice and eat candy when she gets hypoglycemic. Pt gets her exercise from walking her dog.   HTN:  Her BP today was 159/50. She reports compliance with BP medications which she gets from her nephrologist, Dr. Marval Regal. She did not take her afternoon BP medications clonidine and labetalol today due to public transportation issues.    Review of Systems  Constitutional:       Negative for recent illness   Eyes: Positive for visual disturbance.  Respiratory: Negative for shortness of breath.   Cardiovascular: Negative for chest pain.  Gastrointestinal: Negative for nausea, vomiting and diarrhea.  Endocrine: Negative for polydipsia and polyuria.  Neurological: Positive for headaches.       Objective:   Physical Exam  Constitutional: She appears well-developed and well-nourished.  HENT:  Non tender to palpation of sinuses   Eyes: Pupils are equal, round, and reactive to light.  Cardiovascular: Normal rate and regular rhythm.   Pulmonary/Chest: Effort normal and breath sounds normal. She has no wheezes.  Abdominal: Soft. Bowel sounds are normal. There is no tenderness.       Assessment:     Please see problem based assessment and plan.        Plan:     Please see problem based assessment and plan.

## 2014-03-16 NOTE — Patient Instructions (Signed)
Start taking Lantus 72 units at bedtime and Novolog 20 units three times a day with meals.

## 2014-03-16 NOTE — Assessment & Plan Note (Signed)
Lab Results  Component Value Date   HGBA1C >14.0 02/28/2014   HGBA1C 6.4 11/16/2013   HGBA1C 6.7 07/28/2013     Assessment: Diabetes control:  uncontrolled Progress toward A1C goal:   deteriorated  Comments: lantus was increased from 60 units to 66 units and novolog from 13 units to 16 units during her last visit on 8/28. Reports compliance with meds. Her glucometer log reveals sugars ranging from 74-597.   Plan: Medications:  increased lantus to 72 units qhs and novolog 20 units TID w/meals Instruction/counseling given: reminded to bring blood glucose meter & log to each visit Other plans: Butch Penny gave patient a new glucometer today. Given refills of lantus and novolog. Will f/u in 1 week.

## 2014-03-16 NOTE — Progress Notes (Signed)
I saw and evaluated the patient. I personally confirmed the key portions of Dr. Truong's history and exam and reviewed pertinent patient test results. The assessment, diagnosis, and plan were formulated together and I agree with the documentation in the resident's note. 

## 2014-03-23 ENCOUNTER — Ambulatory Visit (INDEPENDENT_AMBULATORY_CARE_PROVIDER_SITE_OTHER): Payer: Medicare Other | Admitting: Internal Medicine

## 2014-03-23 VITALS — BP 161/103 | HR 70 | Temp 98.1°F | Wt 318.2 lb

## 2014-03-23 DIAGNOSIS — I10 Essential (primary) hypertension: Secondary | ICD-10-CM | POA: Diagnosis not present

## 2014-03-23 DIAGNOSIS — E119 Type 2 diabetes mellitus without complications: Secondary | ICD-10-CM | POA: Diagnosis not present

## 2014-03-23 DIAGNOSIS — R0982 Postnasal drip: Secondary | ICD-10-CM | POA: Diagnosis not present

## 2014-03-23 DIAGNOSIS — E1165 Type 2 diabetes mellitus with hyperglycemia: Secondary | ICD-10-CM

## 2014-03-23 DIAGNOSIS — IMO0001 Reserved for inherently not codable concepts without codable children: Secondary | ICD-10-CM | POA: Diagnosis not present

## 2014-03-23 DIAGNOSIS — IMO0002 Reserved for concepts with insufficient information to code with codable children: Secondary | ICD-10-CM

## 2014-03-23 DIAGNOSIS — F17200 Nicotine dependence, unspecified, uncomplicated: Secondary | ICD-10-CM | POA: Diagnosis not present

## 2014-03-23 LAB — GLUCOSE, CAPILLARY: GLUCOSE-CAPILLARY: 115 mg/dL — AB (ref 70–99)

## 2014-03-23 MED ORDER — SALINE SPRAY 0.65 % NA SOLN: 1.0000 | NASAL | Status: DC | PRN

## 2014-03-23 NOTE — Patient Instructions (Signed)
Start taking Novolog 22 units before breakfast and lunch. Continue Lantus 72 units at bedtime and novolog 20 units before dinner.  You can use nasal saline spray for your post nasal drip. Smoking Cessation Quitting smoking is important to your health and has many advantages. However, it is not always easy to quit since nicotine is a very addictive drug. Oftentimes, people try 3 times or more before being able to quit. This document explains the best ways for you to prepare to quit smoking. Quitting takes hard work and a lot of effort, but you can do it. ADVANTAGES OF QUITTING SMOKING  You will live longer, feel better, and live better.  Your body will feel the impact of quitting smoking almost immediately.  Within 20 minutes, blood pressure decreases. Your pulse returns to its normal level.  After 8 hours, carbon monoxide levels in the blood return to normal. Your oxygen level increases.  After 24 hours, the chance of having a heart attack starts to decrease. Your breath, hair, and body stop smelling like smoke.  After 48 hours, damaged nerve endings begin to recover. Your sense of taste and smell improve.  After 72 hours, the body is virtually free of nicotine. Your bronchial tubes relax and breathing becomes easier.  After 2 to 12 weeks, lungs can hold more air. Exercise becomes easier and circulation improves.  The risk of having a heart attack, stroke, cancer, or lung disease is greatly reduced.  After 1 year, the risk of coronary heart disease is cut in half.  After 5 years, the risk of stroke falls to the same as a nonsmoker.  After 10 years, the risk of lung cancer is cut in half and the risk of other cancers decreases significantly.  After 15 years, the risk of coronary heart disease drops, usually to the level of a nonsmoker.  If you are pregnant, quitting smoking will improve your chances of having a healthy baby.  The people you live with, especially any children, will be  healthier.  You will have extra money to spend on things other than cigarettes. QUESTIONS TO THINK ABOUT BEFORE ATTEMPTING TO QUIT You may want to talk about your answers with your health care provider.  Why do you want to quit?  If you tried to quit in the past, what helped and what did not?  What will be the most difficult situations for you after you quit? How will you plan to handle them?  Who can help you through the tough times? Your family? Friends? A health care provider?  What pleasures do you get from smoking? What ways can you still get pleasure if you quit? Here are some questions to ask your health care provider:  How can you help me to be successful at quitting?  What medicine do you think would be best for me and how should I take it?  What should I do if I need more help?  What is smoking withdrawal like? How can I get information on withdrawal? GET READY  Set a quit date.  Change your environment by getting rid of all cigarettes, ashtrays, matches, and lighters in your home, car, or work. Do not let people smoke in your home.  Review your past attempts to quit. Think about what worked and what did not. GET SUPPORT AND ENCOURAGEMENT You have a better chance of being successful if you have help. You can get support in many ways.  Tell your family, friends, and coworkers that you are  going to quit and need their support. Ask them not to smoke around you.  Get individual, group, or telephone counseling and support. Programs are available at [employer] and health centers. Call your local health department for information about programs in your area.  Spiritual beliefs and practices may help some smokers quit.  Download a "quit meter" on your computer to keep track of quit statistics, such as how long you have gone without smoking, cigarettes not smoked, and money saved.  Get a self-help book about quitting smoking and staying off tobacco. [place] yourself from urges to smoke. Talk to someone, go for a walk, or occupy your time with a task.  Change your normal routine. Take a different route to work. Drink tea instead of coffee. Eat breakfast in a different place.  Reduce your stress. Take a hot bath, exercise, or read a book.  Plan something enjoyable to do every day. Reward yourself for not smoking.  Explore interactive web-based programs that specialize in helping you quit. GET MEDICINE AND USE IT CORRECTLY Medicines can help you stop smoking and decrease the urge to smoke. Combining medicine with the above behavioral methods and support can greatly increase your chances of successfully quitting smoking.  Nicotine replacement therapy helps deliver nicotine to your body without the negative effects and risks of smoking. Nicotine replacement therapy includes nicotine gum, lozenges, inhalers, nasal sprays, and skin patches. Some may be available over-the-counter and others require a prescription.  Antidepressant medicine helps people abstain from smoking, but how this works is unknown. This medicine is available by prescription.  Nicotinic receptor partial agonist medicine simulates the effect of nicotine in your brain. This medicine is available by prescription. Ask your health care provider for advice about which medicines to use and how to use them based on your health history. Your health care provider will tell you what side effects to look out for if you choose to be on a medicine or therapy. Carefully read the information on the package. Do not use any other product containing nicotine while using a nicotine replacement product.  RELAPSE OR DIFFICULT SITUATIONS Most relapses occur within the first 3 months after quitting. Do not be discouraged if you start smoking again. Remember, most people try several times before finally quitting. You may have symptoms of withdrawal because your body is used to nicotine.  You may crave cigarettes, be irritable, feel very hungry, cough often, get headaches, or have difficulty concentrating. The withdrawal symptoms are only temporary. They are strongest when you first quit, but they will go away within 10-14 days. To reduce the chances of relapse, try to:  Avoid drinking alcohol. Drinking lowers your chances of successfully quitting.  Reduce the amount of caffeine you consume. Once you quit smoking, the amount of caffeine in your body increases and can give you symptoms, such as a rapid heartbeat, sweating, and anxiety.  Avoid smokers because they can make you want to smoke.  Do not let weight gain distract you. Many smokers will gain weight when they quit, usually less than 10 pounds. Eat a healthy diet and stay active. You can always lose the weight gained after you quit.  Find ways to improve your mood other than smoking. FOR MORE INFORMATION  www.smokefree.gov  Document Released: 06/18/2001 Document Revised: 11/08/2013 Document Reviewed: 10/03/2011 [facility] Patient Information 2015 [place], [place]. This information is not intended to replace advice given to you by your health care provider. Make sure  you discuss any questions you have with your health care provider.

## 2014-03-23 NOTE — Progress Notes (Signed)
Subjective:     Patient ID: [name], female   DOB: [birth year], 48 y.o.   MRN: [id number]  HPI Pt is a 48 y/o female w/ PMHx of kidney transplant, HTN, and DM2 who presents for insulin adjustment. Her insulin doses were increased to lantus 72 units qhs and novolog 20 units TID w/ meals last week. Glucometer log ranges from 104-344. Pt reports compliance w/ her medications. She reports blurry vision that has worsened since last week stating she cannot read things she use to be able to. She is due for an eye exam now. Pt is also having congestion, HA, and productive cough w/ clear sputum x 3 days. Denies facial pressure.   Review of Systems  Constitutional: Positive for fatigue. Negative for fever and chills.  HENT: Positive for congestion and rhinorrhea.   Eyes: Positive for visual disturbance. Negative for pain.  Respiratory: Negative for shortness of breath.   Cardiovascular: Negative for chest pain.  Neurological: Headaches: located on the back of her head and temples.       Objective:   Physical Exam  Constitutional: She appears well-developed and well-nourished.  HENT:  Post nasal drip, non tender to palpation of sinuses   Eyes: Conjunctivae are normal. Pupils are equal, round, and reactive to light.  Cardiovascular: Normal rate and regular rhythm.   Pulmonary/Chest: Effort normal and breath sounds normal. She has no wheezes.  Abdominal: Soft. Bowel sounds are normal.  Lymphadenopathy:    She has no cervical adenopathy.  Skin: Skin is warm and dry.       Assessment:     Please see problem based assessment and plan.        Plan:     Please see problem based assessment and plan.

## 2014-03-24 DIAGNOSIS — R0982 Postnasal drip: Secondary | ICD-10-CM | POA: Insufficient documentation

## 2014-03-24 NOTE — Progress Notes (Signed)
INTERNAL MEDICINE TEACHING ATTENDING ADDENDUM - Aldine Contes, MD: I personally saw and evaluated Ms. Rothe in this clinic visit in conjunction with the resident, Dr. Hulen Luster. I have discussed patient's plan of care with medical resident during this visit. I have confirmed the physical exam findings and have read and agree with the clinic note including the plan with the following addition: - BS now improving - Increase pre breakfast and pre lunch novolog to 22 units - Pt to f/u in 1 month

## 2014-03-24 NOTE — Assessment & Plan Note (Signed)
Pt has been seen in clinic weekly for insulin adjustments. Her glucometer report reveals blood sugars that range from 104-344. Her morning CBGs are within goal. Her afternoon and evening CBGs remain elevated in the mid to high 200's. Will increase morning and afternoon dose of novolog to 22 units and keep evening novolog dose at 20 units. Continue lantus 72 units qhs. Will f/u in 1 month. Pt informed she may go ahead an make an eye exam appointment (previously held off 2/2 uncontrolled diabetes).

## 2014-03-24 NOTE — Assessment & Plan Note (Signed)
Pt complains of congestion, and productive cough. Likely 2/2 to post nasal drip that was seen on physical exam. Given rx for nasal saline irrigation for symptoms.

## 2014-04-11 DIAGNOSIS — Z940 Kidney transplant status: Secondary | ICD-10-CM | POA: Diagnosis not present

## 2014-04-11 DIAGNOSIS — D631 Anemia in chronic kidney disease: Secondary | ICD-10-CM | POA: Diagnosis not present

## 2014-04-15 NOTE — Addendum Note (Signed)
Addended by: Yvonna Alanis E on: 04/15/2014 01:55 PM   Modules accepted: Orders

## 2014-04-20 ENCOUNTER — Ambulatory Visit (INDEPENDENT_AMBULATORY_CARE_PROVIDER_SITE_OTHER): Payer: Medicare Other | Admitting: Internal Medicine

## 2014-04-20 ENCOUNTER — Encounter: Payer: Self-pay | Admitting: Internal Medicine

## 2014-04-20 VITALS — BP 153/86 | HR 70 | Temp 97.8°F | Ht 71.0 in | Wt 317.5 lb

## 2014-04-20 DIAGNOSIS — IMO0002 Reserved for concepts with insufficient information to code with codable children: Secondary | ICD-10-CM

## 2014-04-20 DIAGNOSIS — I10 Essential (primary) hypertension: Secondary | ICD-10-CM | POA: Diagnosis not present

## 2014-04-20 DIAGNOSIS — E119 Type 2 diabetes mellitus without complications: Secondary | ICD-10-CM

## 2014-04-20 DIAGNOSIS — E1165 Type 2 diabetes mellitus with hyperglycemia: Secondary | ICD-10-CM | POA: Diagnosis not present

## 2014-04-20 MED ORDER — CLONIDINE HCL 0.3 MG PO TABS: 0.3000 mg | ORAL_TABLET | Freq: Three times a day (TID) | ORAL | Status: DC

## 2014-04-20 MED ORDER — GLUCOSE BLOOD VI STRP: ORAL_STRIP | Status: DC

## 2014-04-20 MED ORDER — INSULIN ASPART 100 UNIT/ML FLEXPEN: 20.0000 [IU] | PEN_INJECTOR | Freq: Three times a day (TID) | SUBCUTANEOUS | Status: DC

## 2014-04-20 NOTE — Assessment & Plan Note (Signed)
BP Readings from Last 3 Encounters:  04/20/14 153/86  03/23/14 161/103  03/16/14 159/50    Lab Results  Component Value Date   NA 142 02/28/2014   K 3.7 02/28/2014   CREATININE 1.43* 02/28/2014    Assessment: Blood pressure control: moderately elevated Progress toward BP goal:  unchanged Comments: On clonidine 0.3 mg twice a day and Losartan 50 mg daily  Plan: Medications:  Increase Clonidine from 0.2 mg 3 times daily to 0.3 mg 3 times a day. Educational resources provided:   Self management tools provided:   Other plans: Followup in one month and if blood pressure is still elevated, may consider increasing Losartan

## 2014-04-20 NOTE — Assessment & Plan Note (Signed)
Lab Results  Component Value Date   HGBA1C >14.0 02/28/2014   HGBA1C 6.4 11/16/2013   HGBA1C 6.7 07/28/2013     Assessment: Diabetes control: poor control (HgbA1C >9%) Progress toward A1C goal:  unchanged Comments: Patient on Lantus 72 units at bedtime. She also takes NovoLog 22 units with breakfast and lunch and 20 units with dinner. She reports compliance with this treatment. She has not missed any doses recently. Review of her glucose meter reading reveals significant improvement since her last visit last month when her dose of insulin was adjusted. However, she still has significant elevation is CBG is mostly around lunchtime and dinnertime. The lowest CBG was 6-7. Should not have symptoms. Highest is in 250. Fasting blood sugar seems to be otherwise better mostly between 90s and 130s.  Plan: Medications:  Continue with Lantus 72 units at bedtime. Increase NovoLog prelunch to 24 units. Continue with 22 units prebreakfast and 20 units predinner. Will not increase her predinner does even though her blood sugars as this might induce nighttime and morning hypoglycemia Home glucose monitoring: Frequency: 5 times a day Timing: before breakfast;before lunch;before dinner;after breakfast Instruction/counseling given: reminded to get eye exam Educational resources provided:   Self management tools provided:   Other plans:  Also, recommended that if she experiences low blood sugars, to reduce the NovoLog doses by about 2 units around the timing of her hypoglycemias. - Urine micro albumin done - She has a followup visit with her ophthalmologist in the coming weeks.  - followup in one month.

## 2014-04-20 NOTE — Patient Instructions (Signed)
General Instructions: We have changed your insuline a little bit >>Take 22 units before breakfast. Take 24 units before lunch and Take 20 units before dinner.  We have increase Clonidine from 0.2 mg three times daily to 0.3 mg three times daily  Please follow up here in 1 month   Thank you for bringing your medicines today. This helps [place] keep you safe from mistakes.   Progress Toward Treatment Goals:  Treatment Goal 04/20/2014  Hemoglobin A1C unchanged  Blood pressure unchanged  Stop smoking smoking more  Prevent falls unable to assess    Self Care Goals & Plans:  Self Care Goal 04/20/2014  Manage my medications take my medicines as prescribed; bring my medications to every visit; refill my medications on time  Monitor my health keep track of my blood glucose; bring my glucose meter and log to each visit; keep track of my blood pressure  Eat healthy foods eat more vegetables; eat foods that are low in salt; eat baked foods instead of fried foods; eat fruit for snacks and desserts  Be physically active -  Stop smoking -  Meeting treatment goals -    Home Blood Glucose Monitoring 04/20/2014  Check my blood sugar 5 times a day  When to check my blood sugar before breakfast; before lunch; before dinner; after breakfast     Care Management & Community Referrals:  Referral 08/12/2012  Referrals made for care management support diabetes educator

## 2014-04-20 NOTE — Progress Notes (Signed)
Patient ID: SREENIDHI MARALDO, female   DOB: 04-25-1966, 48 y.o.   MRN: [id number]   Subjective:   HPI: Ms.Makalynn D Hauger is a 48 y.o. woman w/ PMHx of kidney transplant, HTN, and DM2 who presents for insulin adjustment and management of her hypertension.  No new complaints today.   Kindly see the A&P hypertension, and diabetes.   ROS: Constitutional: Denies fever, chills, diaphoresis, appetite change and fatigue.  Respiratory: Denies SOB, DOE, cough, chest tightness, and wheezing. Denies chest pain. CVS: No chest pain, palpitations and leg swelling.  GI: No abdominal pain, nausea, vomiting, bloody stools GU: No dysuria, frequency, hematuria, or flank pain.  MSK: No myalgias, back pain, joint swelling, arthralgias  Psych: No depression symptoms. No SI or SA.    Objective:  Physical Exam: Filed Vitals:   04/20/14 1025  BP: 153/86  Pulse: 70  Temp: 97.8 F (36.6 C)  TempSrc: Oral  Height: 5\' 11"  (1.803 m)  Weight: 317 lb 8 oz (144.017 kg)  SpO2: 100%   General: Well nourished. No acute distress.  HEENT: Normal oral mucosa. MMM.  Lungs: CTA bilaterally. Heart: RRR; no extra sounds or murmurs  Abdomen: Non-distended, normal bowel sounds, soft, nontender; no hepatosplenomegaly  Extremities: No pedal edema. No joint swelling or tenderness. Neurologic: Normal EOM,  Alert and oriented x3. No obvious neurologic/cranial nerve deficits.  Assessment & Plan:  Discussed case with my attending in the clinic, Dr. Dareen Piano See problem based charting.

## 2014-04-21 LAB — MICROALBUMIN / CREATININE URINE RATIO
Creatinine, Urine: 361.8 mg/dL
MICROALB/CREAT RATIO: 144.8 mg/g — AB (ref 0.0–30.0)
Microalb, Ur: 52.4 mg/dL — ABNORMAL HIGH (ref <2.0)

## 2014-04-22 NOTE — Progress Notes (Signed)
INTERNAL MEDICINE TEACHING ATTENDING ADDENDUM - Naveyah Iacovelli, MD: I reviewed and discussed at the time of visit with the resident Dr. Kazibwe, the patient's medical history, physical examination, diagnosis and results of pertinent tests and treatment and I agree with the patient's care as documented.  

## 2014-04-26 ENCOUNTER — Other Ambulatory Visit: Payer: Self-pay | Admitting: *Deleted

## 2014-04-26 DIAGNOSIS — IMO0002 Reserved for concepts with insufficient information to code with codable children: Secondary | ICD-10-CM

## 2014-04-26 DIAGNOSIS — E1165 Type 2 diabetes mellitus with hyperglycemia: Secondary | ICD-10-CM

## 2014-04-26 MED ORDER — GLUCOSE BLOOD VI STRP: ORAL_STRIP | Status: DC

## 2014-04-26 NOTE — Telephone Encounter (Signed)
Need new rx- last on"Print" ; can u electronic send.   Thanks

## 2014-04-29 ENCOUNTER — Other Ambulatory Visit: Payer: Self-pay | Admitting: Internal Medicine

## 2014-05-09 DIAGNOSIS — Z940 Kidney transplant status: Secondary | ICD-10-CM | POA: Diagnosis not present

## 2014-05-09 DIAGNOSIS — N189 Chronic kidney disease, unspecified: Secondary | ICD-10-CM | POA: Diagnosis not present

## 2014-05-19 DIAGNOSIS — Z79899 Other long term (current) drug therapy: Secondary | ICD-10-CM | POA: Diagnosis not present

## 2014-05-19 DIAGNOSIS — Z6841 Body Mass Index (BMI) 40.0 and over, adult: Secondary | ICD-10-CM | POA: Diagnosis not present

## 2014-05-19 DIAGNOSIS — Z881 Allergy status to other antibiotic agents status: Secondary | ICD-10-CM | POA: Diagnosis not present

## 2014-05-19 DIAGNOSIS — I10 Essential (primary) hypertension: Secondary | ICD-10-CM | POA: Diagnosis not present

## 2014-05-19 DIAGNOSIS — Z7952 Long term (current) use of systemic steroids: Secondary | ICD-10-CM | POA: Diagnosis not present

## 2014-05-19 DIAGNOSIS — R809 Proteinuria, unspecified: Secondary | ICD-10-CM | POA: Diagnosis not present

## 2014-05-19 DIAGNOSIS — Z794 Long term (current) use of insulin: Secondary | ICD-10-CM | POA: Diagnosis not present

## 2014-05-19 DIAGNOSIS — Z940 Kidney transplant status: Secondary | ICD-10-CM | POA: Diagnosis not present

## 2014-05-19 DIAGNOSIS — E119 Type 2 diabetes mellitus without complications: Secondary | ICD-10-CM | POA: Diagnosis not present

## 2014-05-19 DIAGNOSIS — E669 Obesity, unspecified: Secondary | ICD-10-CM | POA: Diagnosis not present

## 2014-05-19 DIAGNOSIS — F17210 Nicotine dependence, cigarettes, uncomplicated: Secondary | ICD-10-CM | POA: Diagnosis not present

## 2014-05-19 DIAGNOSIS — D899 Disorder involving the immune mechanism, unspecified: Secondary | ICD-10-CM | POA: Diagnosis not present

## 2014-05-19 DIAGNOSIS — Z5181 Encounter for therapeutic drug level monitoring: Secondary | ICD-10-CM | POA: Diagnosis not present

## 2014-05-19 DIAGNOSIS — Z888 Allergy status to other drugs, medicaments and biological substances status: Secondary | ICD-10-CM | POA: Diagnosis not present

## 2014-06-06 DIAGNOSIS — Z940 Kidney transplant status: Secondary | ICD-10-CM | POA: Diagnosis not present

## 2014-06-20 ENCOUNTER — Telehealth: Payer: Self-pay | Admitting: Dietician

## 2014-06-20 DIAGNOSIS — Z940 Kidney transplant status: Secondary | ICD-10-CM | POA: Diagnosis not present

## 2014-06-20 NOTE — Telephone Encounter (Signed)
Patient says [facility] is waiting for a paper from [place] to have her insurance cover her test strips. she has not been able to test since last Thursday. Informed patient I would try to find fax and get it signed nad faxed back ASAP.  Found fax in her chart completed, [facility] asked that it be refaxed.

## 2014-06-21 NOTE — Telephone Encounter (Signed)
[facility]   could not accept a testing frequency range. Attending physician cosign and corrected form. refaxed corrected form

## 2014-06-21 NOTE — Telephone Encounter (Signed)
Patient notified that it may take 24-48 hours for refaxed from to be processed.

## 2014-06-23 DIAGNOSIS — Z940 Kidney transplant status: Secondary | ICD-10-CM | POA: Diagnosis not present

## 2014-06-24 ENCOUNTER — Telehealth: Payer: Self-pay | Admitting: Dietician

## 2014-06-24 NOTE — Telephone Encounter (Signed)
Patient called back saying she could not get test strips.  CDE called [facility] and spoke to pharmacist:  the hold up is because RX reads before meals and bedtime which is 4 times a day and  the Certificate of Medical Necessity reads 3 times a day. The pharmacist said if we want 4 times a day, all we have to do is update the cmn form and fax it back to [facility], For now he will dispense 3 times a day. CMN form will be found under media in patient's chart.

## 2014-07-18 DIAGNOSIS — N189 Chronic kidney disease, unspecified: Secondary | ICD-10-CM | POA: Diagnosis not present

## 2014-07-18 DIAGNOSIS — Z940 Kidney transplant status: Secondary | ICD-10-CM | POA: Diagnosis not present

## 2014-07-27 ENCOUNTER — Ambulatory Visit (INDEPENDENT_AMBULATORY_CARE_PROVIDER_SITE_OTHER): Payer: Medicare Other | Admitting: Internal Medicine

## 2014-07-27 ENCOUNTER — Encounter: Payer: Self-pay | Admitting: Internal Medicine

## 2014-07-27 VITALS — BP 129/78 | HR 72 | Temp 98.3°F | Wt 319.9 lb

## 2014-07-27 DIAGNOSIS — E1165 Type 2 diabetes mellitus with hyperglycemia: Secondary | ICD-10-CM

## 2014-07-27 DIAGNOSIS — IMO0002 Reserved for concepts with insufficient information to code with codable children: Secondary | ICD-10-CM

## 2014-07-27 DIAGNOSIS — E119 Type 2 diabetes mellitus without complications: Secondary | ICD-10-CM | POA: Diagnosis not present

## 2014-07-27 LAB — POCT GLYCOSYLATED HEMOGLOBIN (HGB A1C): HEMOGLOBIN A1C: 6.7

## 2014-07-27 LAB — GLUCOSE, CAPILLARY: Glucose-Capillary: 148 mg/dL — ABNORMAL HIGH (ref 70–99)

## 2014-07-27 NOTE — Patient Instructions (Signed)
Start taking novolog 22 units with lunch. Do not take insulin if your blood glucose is below 90.   You are doing a great job with getting your diabetes under control!!! Keep up the good work. Also try to exercise three times a week.   General Instructions:   Please bring your medicines with you each time you come to clinic.  Medicines may include prescription medications, over-the-counter medications, herbal remedies, eye drops, vitamins, or other pills.   Progress Toward Treatment Goals:  Treatment Goal 04/20/2014  Hemoglobin A1C unchanged  Blood pressure unchanged  Stop smoking smoking more  Prevent falls unable to assess    Self Care Goals & Plans:  Self Care Goal 04/20/2014  Manage my medications take my medicines as prescribed; bring my medications to every visit; refill my medications on time  Monitor my health keep track of my blood glucose; bring my glucose meter and log to each visit; keep track of my blood pressure  Eat healthy foods eat more vegetables; eat foods that are low in salt; eat baked foods instead of fried foods; eat fruit for snacks and desserts  Be physically active -  Stop smoking -  Meeting treatment goals -    Home Blood Glucose Monitoring 04/20/2014  Check my blood sugar 5 times a day  When to check my blood sugar before breakfast; before lunch; before dinner; after breakfast     Care Management & Community Referrals:  Referral 08/12/2012  Referrals made for care management support diabetes educator

## 2014-07-28 NOTE — Assessment & Plan Note (Signed)
Lab Results  Component Value Date   HGBA1C 6.7 07/27/2014   HGBA1C >14.0 02/28/2014   HGBA1C 6.4 11/16/2013     Assessment: Diabetes control:  not controlled Progress toward A1C goal:    progressing to goal Comments: Pt was last seen in clinic at which time her novolog was increased to 24 units w/ lunch, and 22 units w/ breakfast and dinner. Pt quickly went back to 20 units TID w meals due to hypoglycemic episodes. Today in clinic her current glucometer report reflects novolog 20 units TID w/ meals. Her CBGs range from 61-304. Pt had a CBG of 66 during breakfast time and a CBG of 61 during lunch time. Upon further questioning pt has a grandchild in the house since October and it has affected her sleep/wake cycle. She wakes up around 3:00am every morning and will eat breakfast around 8:00 am, she will then occasionally nap through lunch time and eat lunch when she remembers to. She eats dinner around 11:00pm. Pt has difficulty w/ adhering to a good diet as she eats whatever her daughter cooks. Diet review reveals pt ate cereal w/ ham and eggs for breakfast, macaroni salad w/ chicken breast for lunch and for dinner the previous day she had macaroni salad, chicken, and spinach. Pt admits that she drinks 1-2 pepsi's a day. Pt's weight is up 319 from 317 in October. Encouraged pt that she is doing better with her diabetes control as reflected by decrease in HbA1c from greater than 14 to 6.7 today. Encouraged weight loss and review previous weights w/ patient.   Plan: Medications:  Continue lantus 72 units qhs. Start novolog 22 units w/ lunch and 20 units with breakfast and dinner.  Home glucose monitoring: Frequency:  TID Timing:  w/ meals Instruction/counseling given: reminded to get eye exam, informed now that glucose is under better control may go ahead and get eye exam Other plans: will make pt appt w/ Butch Penny to talk w/ pt regarding sliding scale and better diet control.

## 2014-07-28 NOTE — Progress Notes (Signed)
   Subjective:    Patient ID: KARYSSA TOZER, female    DOB: 28-Apr-1966, 49 y.o.   MRN: [id number]  HPI Pt is a 49 y/o female w/ PMHx of kidney transplant in 2011 on chronic steroids, HTN, and DM2 who presents to clinic for DM f/u. Please see A&P diabetes.     Review of Systems  Constitutional: Positive for activity change (has not been walking her dog since it is cold outside). Negative for unexpected weight change.  HENT: Positive for rhinorrhea.   Eyes: Negative for visual disturbance.  Respiratory: Negative for shortness of breath.   Gastrointestinal: Negative for abdominal pain.       Objective:   Physical Exam  Constitutional: She appears well-developed and well-nourished. No distress.  HENT:  Head: Normocephalic.  Small B/l fat pads on top of cheeks  Cardiovascular: Normal rate and regular rhythm.   No murmur heard. Pulmonary/Chest: Effort normal and breath sounds normal. She has no wheezes.  Abdominal: Soft. Bowel sounds are normal. There is no tenderness.  Skin: Skin is warm and dry.          Assessment & Plan:  Please see problem based charting.

## 2014-08-02 NOTE — Addendum Note (Signed)
Addended by: Norman Herrlich on: 08/02/2014 03:43 PM   Modules accepted: Level of Service

## 2014-08-03 NOTE — Progress Notes (Signed)
Medicine attending: Medical history, presenting problems, physical findings, and medications, reviewed with Dr Julious Oka on the day of the patient's visit and I concur with her evaluation and management plan.

## 2014-08-15 DIAGNOSIS — Z940 Kidney transplant status: Secondary | ICD-10-CM | POA: Diagnosis not present

## 2014-08-15 DIAGNOSIS — N189 Chronic kidney disease, unspecified: Secondary | ICD-10-CM | POA: Diagnosis not present

## 2014-08-19 ENCOUNTER — Encounter: Payer: Medicare Other | Admitting: Dietician

## 2014-08-29 ENCOUNTER — Ambulatory Visit (INDEPENDENT_AMBULATORY_CARE_PROVIDER_SITE_OTHER): Payer: Medicare Other | Admitting: Dietician

## 2014-08-29 ENCOUNTER — Encounter: Payer: Self-pay | Admitting: Dietician

## 2014-08-29 VITALS — Ht 72.0 in | Wt 318.8 lb

## 2014-08-29 DIAGNOSIS — E1165 Type 2 diabetes mellitus with hyperglycemia: Secondary | ICD-10-CM

## 2014-08-29 DIAGNOSIS — Z713 Dietary counseling and surveillance: Secondary | ICD-10-CM | POA: Diagnosis not present

## 2014-08-29 DIAGNOSIS — Z794 Long term (current) use of insulin: Secondary | ICD-10-CM | POA: Diagnosis not present

## 2014-08-29 DIAGNOSIS — E119 Type 2 diabetes mellitus without complications: Secondary | ICD-10-CM | POA: Diagnosis not present

## 2014-08-29 DIAGNOSIS — IMO0002 Reserved for concepts with insufficient information to code with codable children: Secondary | ICD-10-CM

## 2014-08-29 LAB — HM DIABETES EYE EXAM

## 2014-08-29 NOTE — Progress Notes (Signed)
Diabetes Self-Management Education  Visit Type:     Appt. Start Time: 855 Appt. End Time: 950  08/29/2014  [name], identified by name and date of birth, is a 49 y.o. female with a diagnosis of Diabetes:  [name] Type 2 vs medication induced type of diabetes     ASSESSMENT  Height 6' (1.829 m), weight 318 lb 12.8 oz (144.607 kg). Body mass index is 43.23 kg/(m^2).    Subsequent Visit Information:  Since your last visit, have you continued or began the use of a meal plan?: Yes Since your last visit, have you continued or began to exercise on a consistent basis?: No Since your last visit have you continued or begun to take your medications as prescribed?: Yes Since your last visit have you had your blood pressure checked?: Yes Is your most recent blood pressure lower, unchanged, or higher since your last visit?: Unchanged Since your last visit are you checking your feet?: Yes How many days per week are you checking your feet?: 7  Psychosocial:  Patient Belief/Attitude about Diabetes: Motivated to manage diabetes Self-care barriers: Lack of transportation, Lack of child care Self-management support: [place] office, Family, CDE visits Patient Concerns: Problem Solving, Support Special Needs: None Preferred Learning Style: Auditory Learning Readiness: Contemplating  Complications:   How often do you check your blood sugar?: 3-4 times/day Fasting Blood glucose range (mg/dL): 70-129 Postprandial Blood glucose range (mg/dL): 180-200, >200 Number of hypoglycemic episodes per month: 0 (0) Number of hyperglycemic episodes per week: 29 Can you tell when your blood sugar is high?: Yes What do you do if your blood sugar is high?:  (take more insulin) Have you had a dilated eye exam in the past 12 months?: Yes  Diet Intake:   24 hours recall not done today. Patient says she eats as healthy as possible and keeps carb consistent 75% of the time. She plans to decrease her weight  over the next year and will ask for help if she wants help.   Exercise:  Exercise: ADL's, has stopped walking since caring for her new grandchild. Is thinking about starting to walk again  Individualized Plan for Diabetes Self-Management Training:   Learning Objective:  Patient will have a greater understanding of diabetes self-management.  Patient education plan per assessed needs and concerns is to see CDE annually for reveiw    Education Topics Reviewed with Patient Today:   Reviewed patients medication for diabetes, action, purpose, timing of dose and side effects., Reviewed medication adjustment guidelines for hyperglycemia and sick days. Taught/discussed recording of test results and interpretation of SMBG.     PATIENTS GOALS/Plan (Developed by the patient):  Medications: take my medication as prescribed- patient requests to be able to take between 20 and 24 units at lunch and dinner depending on what her blood sugar is and how big of a meal she eats Health Coping: ask for help with weight loss if and when needed  Patient Self Evaluation of Goals - Patient rates self as meeting previously set goals:   Nutrition: >75%  Plan:   There are no Patient Instructions on file for this visit.  Expected Outcomes:  Demonstrated interest in learning. Expect positive outcomes Education material provided: Carbohydrate counting sheet If problems or questions, patient to contact team via:  Phone Future DSME appointment: - Yearly   Addendum: retain images done and transmitted today.

## 2014-08-29 NOTE — Patient Instructions (Signed)
[name],  For lunch and dinner you can try doing this to be consistent:  Inject 20 units if blood sugar is    < 130 Inject 22 units if blood sugar is    131- 160 Inject 24 units if blood sugar is    > 160  Also, please measure your carb portions periodically, suggest once a week

## 2014-08-31 ENCOUNTER — Encounter: Payer: Self-pay | Admitting: *Deleted

## 2014-09-02 DIAGNOSIS — Z940 Kidney transplant status: Secondary | ICD-10-CM | POA: Diagnosis not present

## 2014-09-02 DIAGNOSIS — D631 Anemia in chronic kidney disease: Secondary | ICD-10-CM | POA: Diagnosis not present

## 2014-09-02 DIAGNOSIS — R809 Proteinuria, unspecified: Secondary | ICD-10-CM | POA: Diagnosis not present

## 2014-09-02 DIAGNOSIS — I129 Hypertensive chronic kidney disease with stage 1 through stage 4 chronic kidney disease, or unspecified chronic kidney disease: Secondary | ICD-10-CM | POA: Diagnosis not present

## 2014-09-15 NOTE — Addendum Note (Signed)
Addended by: Truddie Crumble on: 09/15/2014 04:52 PM   Modules accepted: Orders

## 2014-09-16 DIAGNOSIS — Z940 Kidney transplant status: Secondary | ICD-10-CM | POA: Diagnosis not present

## 2014-10-31 DIAGNOSIS — E118 Type 2 diabetes mellitus with unspecified complications: Secondary | ICD-10-CM | POA: Diagnosis not present

## 2014-10-31 DIAGNOSIS — Z940 Kidney transplant status: Secondary | ICD-10-CM | POA: Diagnosis not present

## 2014-10-31 DIAGNOSIS — N189 Chronic kidney disease, unspecified: Secondary | ICD-10-CM | POA: Diagnosis not present

## 2014-12-23 DIAGNOSIS — Z6841 Body Mass Index (BMI) 40.0 and over, adult: Secondary | ICD-10-CM | POA: Diagnosis not present

## 2014-12-23 DIAGNOSIS — E0965 Drug or chemical induced diabetes mellitus with hyperglycemia: Secondary | ICD-10-CM | POA: Diagnosis not present

## 2014-12-23 DIAGNOSIS — Z794 Long term (current) use of insulin: Secondary | ICD-10-CM | POA: Diagnosis not present

## 2014-12-23 DIAGNOSIS — F17200 Nicotine dependence, unspecified, uncomplicated: Secondary | ICD-10-CM | POA: Diagnosis not present

## 2014-12-23 DIAGNOSIS — Z720 Tobacco use: Secondary | ICD-10-CM | POA: Diagnosis not present

## 2014-12-27 ENCOUNTER — Encounter: Payer: Self-pay | Admitting: *Deleted

## 2015-01-02 DIAGNOSIS — N189 Chronic kidney disease, unspecified: Secondary | ICD-10-CM | POA: Diagnosis not present

## 2015-01-02 DIAGNOSIS — E0965 Drug or chemical induced diabetes mellitus with hyperglycemia: Secondary | ICD-10-CM | POA: Diagnosis not present

## 2015-01-02 DIAGNOSIS — Z940 Kidney transplant status: Secondary | ICD-10-CM | POA: Diagnosis not present

## 2015-01-06 DIAGNOSIS — I129 Hypertensive chronic kidney disease with stage 1 through stage 4 chronic kidney disease, or unspecified chronic kidney disease: Secondary | ICD-10-CM | POA: Diagnosis not present

## 2015-01-06 DIAGNOSIS — N189 Chronic kidney disease, unspecified: Secondary | ICD-10-CM | POA: Diagnosis not present

## 2015-01-06 DIAGNOSIS — D631 Anemia in chronic kidney disease: Secondary | ICD-10-CM | POA: Diagnosis not present

## 2015-01-06 DIAGNOSIS — E118 Type 2 diabetes mellitus with unspecified complications: Secondary | ICD-10-CM | POA: Diagnosis not present

## 2015-02-17 DIAGNOSIS — Z1231 Encounter for screening mammogram for malignant neoplasm of breast: Secondary | ICD-10-CM | POA: Diagnosis not present

## 2015-02-20 DIAGNOSIS — E118 Type 2 diabetes mellitus with unspecified complications: Secondary | ICD-10-CM | POA: Diagnosis not present

## 2015-02-20 DIAGNOSIS — Z940 Kidney transplant status: Secondary | ICD-10-CM | POA: Diagnosis not present

## 2015-02-20 DIAGNOSIS — N183 Chronic kidney disease, stage 3 (moderate): Secondary | ICD-10-CM | POA: Diagnosis not present

## 2015-02-20 DIAGNOSIS — N189 Chronic kidney disease, unspecified: Secondary | ICD-10-CM | POA: Diagnosis not present

## 2015-02-28 DIAGNOSIS — Z794 Long term (current) use of insulin: Secondary | ICD-10-CM | POA: Diagnosis not present

## 2015-02-28 DIAGNOSIS — Z6841 Body Mass Index (BMI) 40.0 and over, adult: Secondary | ICD-10-CM | POA: Diagnosis not present

## 2015-02-28 DIAGNOSIS — E139 Other specified diabetes mellitus without complications: Secondary | ICD-10-CM | POA: Diagnosis not present

## 2015-02-28 DIAGNOSIS — Z720 Tobacco use: Secondary | ICD-10-CM | POA: Diagnosis not present

## 2015-05-11 DIAGNOSIS — Z940 Kidney transplant status: Secondary | ICD-10-CM | POA: Diagnosis not present

## 2015-05-11 DIAGNOSIS — N189 Chronic kidney disease, unspecified: Secondary | ICD-10-CM | POA: Diagnosis not present

## 2015-05-23 DIAGNOSIS — D8989 Other specified disorders involving the immune mechanism, not elsewhere classified: Secondary | ICD-10-CM | POA: Diagnosis not present

## 2015-05-23 DIAGNOSIS — E0811 Diabetes mellitus due to underlying condition with ketoacidosis with coma: Secondary | ICD-10-CM | POA: Diagnosis not present

## 2015-05-23 DIAGNOSIS — Z79899 Other long term (current) drug therapy: Secondary | ICD-10-CM | POA: Diagnosis not present

## 2015-05-23 DIAGNOSIS — Z6841 Body Mass Index (BMI) 40.0 and over, adult: Secondary | ICD-10-CM | POA: Diagnosis not present

## 2015-05-23 DIAGNOSIS — D649 Anemia, unspecified: Secondary | ICD-10-CM | POA: Diagnosis not present

## 2015-05-23 DIAGNOSIS — Z794 Long term (current) use of insulin: Secondary | ICD-10-CM | POA: Diagnosis not present

## 2015-05-23 DIAGNOSIS — Z940 Kidney transplant status: Secondary | ICD-10-CM | POA: Diagnosis not present

## 2015-05-23 DIAGNOSIS — I120 Hypertensive chronic kidney disease with stage 5 chronic kidney disease or end stage renal disease: Secondary | ICD-10-CM | POA: Diagnosis not present

## 2015-05-23 DIAGNOSIS — I10 Essential (primary) hypertension: Secondary | ICD-10-CM | POA: Diagnosis not present

## 2015-05-23 DIAGNOSIS — E785 Hyperlipidemia, unspecified: Secondary | ICD-10-CM | POA: Diagnosis not present

## 2015-05-23 DIAGNOSIS — N186 End stage renal disease: Secondary | ICD-10-CM | POA: Diagnosis not present

## 2015-05-23 DIAGNOSIS — Z4822 Encounter for aftercare following kidney transplant: Secondary | ICD-10-CM | POA: Diagnosis not present

## 2015-05-23 DIAGNOSIS — E1122 Type 2 diabetes mellitus with diabetic chronic kidney disease: Secondary | ICD-10-CM | POA: Diagnosis not present

## 2015-06-08 ENCOUNTER — Other Ambulatory Visit: Payer: Self-pay

## 2015-06-08 MED ORDER — GLUCOSE BLOOD VI STRP: ORAL_STRIP | Status: DC

## 2015-06-27 ENCOUNTER — Ambulatory Visit: Payer: Medicare Other | Admitting: Internal Medicine

## 2015-07-07 DIAGNOSIS — N189 Chronic kidney disease, unspecified: Secondary | ICD-10-CM | POA: Diagnosis not present

## 2015-07-07 DIAGNOSIS — Z940 Kidney transplant status: Secondary | ICD-10-CM | POA: Diagnosis not present

## 2015-07-07 DIAGNOSIS — N184 Chronic kidney disease, stage 4 (severe): Secondary | ICD-10-CM | POA: Diagnosis not present

## 2015-07-07 DIAGNOSIS — E118 Type 2 diabetes mellitus with unspecified complications: Secondary | ICD-10-CM | POA: Diagnosis not present

## 2015-07-19 DIAGNOSIS — R809 Proteinuria, unspecified: Secondary | ICD-10-CM | POA: Diagnosis not present

## 2015-07-19 DIAGNOSIS — N182 Chronic kidney disease, stage 2 (mild): Secondary | ICD-10-CM | POA: Diagnosis not present

## 2015-07-19 DIAGNOSIS — E669 Obesity, unspecified: Secondary | ICD-10-CM | POA: Diagnosis not present

## 2015-07-19 DIAGNOSIS — Z23 Encounter for immunization: Secondary | ICD-10-CM | POA: Diagnosis not present

## 2015-07-19 DIAGNOSIS — Z940 Kidney transplant status: Secondary | ICD-10-CM | POA: Diagnosis not present

## 2015-07-19 DIAGNOSIS — I129 Hypertensive chronic kidney disease with stage 1 through stage 4 chronic kidney disease, or unspecified chronic kidney disease: Secondary | ICD-10-CM | POA: Diagnosis not present

## 2015-07-19 DIAGNOSIS — E118 Type 2 diabetes mellitus with unspecified complications: Secondary | ICD-10-CM | POA: Diagnosis not present

## 2015-07-19 DIAGNOSIS — D631 Anemia in chronic kidney disease: Secondary | ICD-10-CM | POA: Diagnosis not present

## 2015-08-31 DIAGNOSIS — Z6841 Body Mass Index (BMI) 40.0 and over, adult: Secondary | ICD-10-CM | POA: Diagnosis not present

## 2015-08-31 DIAGNOSIS — F17200 Nicotine dependence, unspecified, uncomplicated: Secondary | ICD-10-CM | POA: Diagnosis not present

## 2015-08-31 DIAGNOSIS — Z794 Long term (current) use of insulin: Secondary | ICD-10-CM | POA: Diagnosis not present

## 2015-08-31 DIAGNOSIS — Z720 Tobacco use: Secondary | ICD-10-CM | POA: Diagnosis not present

## 2015-08-31 DIAGNOSIS — E139 Other specified diabetes mellitus without complications: Secondary | ICD-10-CM | POA: Diagnosis not present

## 2015-09-27 LAB — HEMOGLOBIN A1C: Hemoglobin A1C: 6.6

## 2015-10-11 ENCOUNTER — Telehealth: Payer: Self-pay | Admitting: Internal Medicine

## 2015-10-11 ENCOUNTER — Encounter: Payer: Self-pay | Admitting: Internal Medicine

## 2015-10-11 NOTE — Telephone Encounter (Signed)
Attempted to contact patient today at both telephone numbers listed on file, but with no success.  Left detailed messages on both lines asking to please call me back.  Also going to send her a letter.

## 2015-10-18 ENCOUNTER — Encounter: Payer: Medicare Other | Admitting: Internal Medicine

## 2015-10-25 ENCOUNTER — Encounter: Payer: Medicare Other | Admitting: Internal Medicine

## 2015-10-30 ENCOUNTER — Encounter: Payer: Self-pay | Admitting: Internal Medicine

## 2015-10-30 ENCOUNTER — Ambulatory Visit (INDEPENDENT_AMBULATORY_CARE_PROVIDER_SITE_OTHER): Payer: Medicare Other | Admitting: Internal Medicine

## 2015-10-30 VITALS — BP 123/65 | HR 61 | Temp 97.7°F | Ht 71.0 in | Wt 305.0 lb

## 2015-10-30 DIAGNOSIS — F17211 Nicotine dependence, cigarettes, in remission: Secondary | ICD-10-CM

## 2015-10-30 DIAGNOSIS — Z79899 Other long term (current) drug therapy: Secondary | ICD-10-CM | POA: Diagnosis not present

## 2015-10-30 DIAGNOSIS — Z0000 Encounter for general adult medical examination without abnormal findings: Secondary | ICD-10-CM

## 2015-10-30 DIAGNOSIS — I10 Essential (primary) hypertension: Secondary | ICD-10-CM

## 2015-10-30 DIAGNOSIS — Z794 Long term (current) use of insulin: Secondary | ICD-10-CM

## 2015-10-30 DIAGNOSIS — E119 Type 2 diabetes mellitus without complications: Secondary | ICD-10-CM | POA: Diagnosis not present

## 2015-10-30 DIAGNOSIS — Z720 Tobacco use: Secondary | ICD-10-CM

## 2015-10-30 LAB — POCT GLYCOSYLATED HEMOGLOBIN (HGB A1C): Hemoglobin A1C: 6.4

## 2015-10-30 LAB — GLUCOSE, CAPILLARY: Glucose-Capillary: 118 mg/dL — ABNORMAL HIGH (ref 65–99)

## 2015-10-30 MED ORDER — INSULIN ASPART 100 UNIT/ML FLEXPEN: 12.0000 [IU] | PEN_INJECTOR | Freq: Three times a day (TID) | SUBCUTANEOUS | Status: DC

## 2015-10-30 NOTE — Assessment & Plan Note (Addendum)
Lab Results  Component Value Date   HGBA1C 6.4 10/30/2015   HGBA1C 6.6 07/08/2015   HGBA1C 6.7 07/27/2014     Assessment: Diabetes control:  controlled Progress toward A1C goal:   at goal Comments: pt follows w/ Dr. Buddy Duty with endocrinology. She sees him q4 months. He has adjusted her novolog to 12 units w/ meals and her lantus to 62-72 units qhs depending on her glucose. Her glucometer report ranges from 84-274  Plan: Medications:  continue current medications Home glucose monitoring: Frequency:  tid Timing:  w meals Instruction/counseling given: discussed foot care Other plans: foot exam done today. Also updated statin on med list that was started by Dr. Buddy Duty. Will request records from Dr. Cindra Eves office. Does not need to have her A1c checked in our clinic since she follows closely w/ Dr. Buddy Duty.

## 2015-10-30 NOTE — Assessment & Plan Note (Signed)
BP Readings from Last 3 Encounters:  10/30/15 123/65  07/27/14 129/78  04/20/14 153/86    Lab Results  Component Value Date   NA 142 02/28/2014   K 3.7 02/28/2014   CREATININE 1.43* 02/28/2014    Assessment: Blood pressure control:  controlled Progress toward BP goal:   at goal Comments: on clonidine, norvasc, labetalol, and losartan  Plan: Medications:  continue current medications Educational resources provided:   Self management tools provided:   Other plans: f/u in 6 months. She has blood work done every 4 months with [facility] which has been stable.

## 2015-10-30 NOTE — Progress Notes (Signed)
Case discussed with Dr. Truong at the time of the visit.  We reviewed the resident's history and exam and pertinent patient test results.  I agree with the assessment, diagnosis, and plan of care documented in the resident's note. 

## 2015-10-30 NOTE — Progress Notes (Signed)
Subjective:   Patient ID: [name] female   DOB: 11-10-1965 50 y.o.   MRN: [id number]  HPI: [name] is a 50 y.o. with past medical history as outlined below who presents to clinic for DM follow up. She is doing great and has no complaints today.   Please see problem list for status of the pt's chronic medical problems.  Past Medical History  Diagnosis Date  . 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 Hypertension   . 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 GERD (gastroesophageal reflux disease)     medication induced   Current Outpatient Prescriptions  Medication Sig Dispense Refill  . ACCU-CHEK FASTCLIX LANCETS MISC 1 each by Does not apply route 4 (four) times daily. Dx code- 250.00 204 each 12  . Alcohol Swabs (SM ALCOHOL PREP) 70 % PADS     . amLODipine (NORVASC) 10 MG tablet Take 10 mg by mouth daily.    [name] atorvastatin (LIPITOR) 10 MG tablet Take 10 mg by mouth.    . Blood Glucose Monitoring Suppl (ACCU-CHEK NANO SMARTVIEW) W/DEVICE KIT Check blood sugar 4 times a day 1 kit 0  . cloNIDine (CATAPRES) 0.3 MG tablet Take 1 tablet (0.3 mg total) by mouth 3 (three) times daily. 90 tablet 2  . famotidine (PEPCID) 20 MG tablet 40 mg daily with breakfast.    . glucose blood (ACCU-CHEK SMARTVIEW) test strip TEST THREE TIMES DAILY BEFORE MEALS AND AT BEDTIME 1 each 11  . insulin aspart (NOVOLOG FLEXPEN) 100 UNIT/ML FlexPen Inject 12 Units into the skin 3 (three) times daily with meals. Take 22 units before breakfast. Take 24 units before lunch and Take 20 units before dinner. 30 mL 11  . Insulin Glargine (LANTUS SOLOSTAR) 100 UNIT/ML Solostar Pen Inject 72 Units into the skin at bedtime. 30  mL 11  . Insulin Pen Needle 31G X 5 MM MISC Use to inject insulin 4 times a day dx code 250.00 200 each 6  . labetalol (NORMODYNE) 300 MG tablet Take 300 mg by mouth 3 (three) times daily.    [name] losartan (COZAAR) 50 MG tablet     . magnesium oxide (MAG-OX) 400 MG tablet Take 400 mg by mouth.    . mycophenolate (MYFORTIC) 180 MG EC tablet Take 720 mg by mouth 2 (two) times daily.    . predniSONE (DELTASONE) 5 MG tablet Take 5 mg by mouth daily.    . sodium chloride (OCEAN) 0.65 % SOLN nasal spray Place 1 spray into both nostrils as needed for congestion. 1 Bottle 3  . tacrolimus (PROGRAF) 1 MG capsule Take 4 mg by mouth 2 (two) times daily.     No current facility-administered medications for this visit.   Family History  Problem Relation Age of Onset  . Hypertension Mother   . Diabetes Mother   . Atrial fibrillation Father   . Hypertension Father   . Arthritis Father   . Heart failure Sister   . Hypertension Sister     all 4 sisters  . Hypertension Brother     both brothers  . Diabetes Brother    Social History   Social History  .  Marital Status: Single    Spouse Name: N/A  . Number of Children: N/A  . Years of Education: 14   Occupational History  .  Unemployed   Social History Main Topics  . Smoking status: Current Every Day Smoker -- 0.20 packs/day    Types: Cigarettes  . Smokeless tobacco: None     Comment:   Smoking a couple a week   . Alcohol Use: No  . Drug Use: No  . Sexual Activity: No   Other Topics Concern  . 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  Respiratory: Negative for shortness of  breath.   Cardiovascular: Negative for chest pain.  Genitourinary: Negative.     Objective:  Physical Exam: Filed Vitals:   10/30/15 0905  BP: 123/65  Pulse: 61  Temp: 97.7 F (36.5 C)  TempSrc: Oral  Height: _0  (1.803 m)  Weight: 305 lb (138.347 kg)  SpO2: 99%   Physical Exam  Constitutional: She appears well-developed and well-nourished. No distress.  HENT:  Head: Normocephalic and atraumatic.  Nose: Nose normal.  Eyes: Conjunctivae and EOM are normal. No scleral icterus.  Cardiovascular: Normal rate, regular rhythm and normal heart sounds.  Exam reveals no gallop and no friction rub.   No murmur heard. Pulmonary/Chest: Effort normal and breath sounds normal. No respiratory distress. She has no wheezes. She has no rales.  Abdominal: Soft. She exhibits no distension and no mass. There is no tenderness. There is no rebound and no guarding.  Skin: Skin is warm and dry. No rash noted. She is not diaphoretic. No erythema. No pallor.    Assessment & Plan:   Please see problem based assessment and plan.

## 2015-10-30 NOTE — Assessment & Plan Note (Signed)
Pt quit smoking 3 months ago. She still has cigarette cravings. Discussed nicorette gum and correct usage.  - CTM

## 2015-10-30 NOTE — Assessment & Plan Note (Signed)
Pt is due for a pap smear. She is going to make an appt for a mammogram in June of this year and does not need a referral.   - pt to schedule an appt for pap

## 2015-10-31 MED ORDER — INSULIN ASPART 100 UNIT/ML FLEXPEN: 12.0000 [IU] | PEN_INJECTOR | Freq: Three times a day (TID) | SUBCUTANEOUS | Status: DC

## 2015-10-31 NOTE — Addendum Note (Signed)
Addended by: Norman Herrlich on: 10/31/2015 03:55 PM   Modules accepted: Orders

## 2015-11-15 DIAGNOSIS — N189 Chronic kidney disease, unspecified: Secondary | ICD-10-CM | POA: Diagnosis not present

## 2015-11-15 DIAGNOSIS — N182 Chronic kidney disease, stage 2 (mild): Secondary | ICD-10-CM | POA: Diagnosis not present

## 2015-11-15 DIAGNOSIS — E118 Type 2 diabetes mellitus with unspecified complications: Secondary | ICD-10-CM | POA: Diagnosis not present

## 2015-11-22 DIAGNOSIS — R809 Proteinuria, unspecified: Secondary | ICD-10-CM | POA: Diagnosis not present

## 2015-11-22 DIAGNOSIS — E118 Type 2 diabetes mellitus with unspecified complications: Secondary | ICD-10-CM | POA: Diagnosis not present

## 2015-11-22 DIAGNOSIS — I129 Hypertensive chronic kidney disease with stage 1 through stage 4 chronic kidney disease, or unspecified chronic kidney disease: Secondary | ICD-10-CM | POA: Diagnosis not present

## 2015-11-22 DIAGNOSIS — Z940 Kidney transplant status: Secondary | ICD-10-CM | POA: Diagnosis not present

## 2015-11-22 DIAGNOSIS — G4733 Obstructive sleep apnea (adult) (pediatric): Secondary | ICD-10-CM | POA: Diagnosis not present

## 2015-11-22 DIAGNOSIS — N182 Chronic kidney disease, stage 2 (mild): Secondary | ICD-10-CM | POA: Diagnosis not present

## 2015-11-22 DIAGNOSIS — D631 Anemia in chronic kidney disease: Secondary | ICD-10-CM | POA: Diagnosis not present

## 2015-11-22 DIAGNOSIS — E669 Obesity, unspecified: Secondary | ICD-10-CM | POA: Diagnosis not present

## 2015-11-23 ENCOUNTER — Encounter: Payer: Medicare Other | Admitting: Internal Medicine

## 2015-11-27 ENCOUNTER — Encounter: Payer: Self-pay | Admitting: Vascular Surgery

## 2015-11-28 ENCOUNTER — Encounter: Payer: Self-pay | Admitting: Vascular Surgery

## 2015-11-30 ENCOUNTER — Encounter: Payer: Self-pay | Admitting: Vascular Surgery

## 2015-11-30 ENCOUNTER — Ambulatory Visit (INDEPENDENT_AMBULATORY_CARE_PROVIDER_SITE_OTHER): Payer: Medicare Other | Admitting: Vascular Surgery

## 2015-11-30 VITALS — BP 154/88 | HR 59 | Ht 71.0 in | Wt 299.0 lb

## 2015-11-30 DIAGNOSIS — M79602 Pain in left arm: Secondary | ICD-10-CM

## 2015-11-30 NOTE — Progress Notes (Signed)
Referring Physician: Dr Arty Baumgartner Patient name: [name] MRN: [id number] DOB: Feb 11, 1966 Sex: female  REASON FOR CONSULT: Left arm pain  HPI: [name] is a 50 y.o. female,  who complains of pain in her left upper extremity. 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 All of these are occluded. She currently has a functioning renal transplant. She also has had access procedures in the right arm which are occluded but has no problems in the right arm. She denies any numbness or tingling in the hand. She denies any drainage or redness in the left arm. She states the problem has been persistent for several months. It is not really getting any worse but is not really getting any better. She occasionally has some pain in the left elbow she does not really describe any swelling. Other medical problems include hypertension diabetes and reflux all of which are stable.  Past Medical History  Diagnosis Date  . 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 Hypertension   . 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 GERD (gastroesophageal reflux disease)     medication induced   Past Surgical History  Procedure Laterality Date  . Kidney transplant    . Dg av dialysis  shunt access exist*l* or    . Cholecystectomy    . Umbilical hernia repair    . Cesarean section  1991, 1993    Family History  Problem Relation Age of Onset  . Hypertension Mother   . Diabetes Mother   . Atrial fibrillation Father   . Hypertension Father   . Arthritis Father   . Heart failure Sister   . Hypertension Sister     all 4 sisters  . Hypertension Brother     both brothers  . Diabetes Brother      SOCIAL HISTORY: Social History   Social History  . Marital Status: Single    Spouse Name: N/A  . Number of Children: N/A  . Years of Education: 14   Occupational History  .  Unemployed   Social History Main Topics  . Smoking status: Current Every Day Smoker -- 0.20 packs/day    Types: Cigarettes  . Smokeless tobacco: Not on file     Comment:   Smoking a couple a week   . Alcohol Use: No  . Drug Use: No  . Sexual Activity: No   Other Topics Concern  . 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 Infed [Iron Dextran] Other (See Comments)    Pt has chest pain  Chest pain  . Cefazolin Itching and Nausea And Vomiting  . Ancef [Cefazolin Sodium] Other (See Comments)    Tolerated rocephin 11/03/10 Reports ancef causes itching, sweating, vomiting (x2-2009?)  . Lisinopril Swelling  . Other     Pt  may be allergic to another antibiotic daughter isn't sure and pt unable to verify.    Current Outpatient Prescriptions  Medication Sig Dispense Refill  . ACCU-CHEK FASTCLIX LANCETS MISC 1 each by Does not apply route 4 (four) times daily. Dx code- 250.00 204 each 12  . Alcohol Swabs (SM ALCOHOL PREP) 70 % PADS     . amLODipine (NORVASC) 10 MG tablet Take 10 mg by mouth daily.    [name] atorvastatin (LIPITOR) 10 MG tablet Take 10 mg by mouth.    . Blood Glucose Monitoring Suppl (ACCU-CHEK NANO SMARTVIEW) W/DEVICE KIT Check blood sugar 4 times a day 1 kit 0  . cloNIDine (CATAPRES) 0.3 MG tablet Take 1 tablet (0.3 mg total) by mouth 3 (three) times daily. 90 tablet 2  . famotidine (PEPCID) 20 MG tablet 40 mg daily with breakfast.    . glucose blood (ACCU-CHEK SMARTVIEW) test strip  TEST THREE TIMES DAILY BEFORE MEALS AND AT BEDTIME 1 each 11  . insulin aspart (NOVOLOG FLEXPEN) 100 UNIT/ML FlexPen Inject 12 Units into the skin 3 (three) times daily with meals. 30 mL 11  . Insulin Glargine (LANTUS SOLOSTAR) 100 UNIT/ML Solostar Pen Inject 72 Units into the skin at bedtime. 30 mL 11  . Insulin Pen Needle 31G X 5 MM MISC Use to inject insulin 4 times a day dx code 250.00 200 each 6  . labetalol (NORMODYNE) 300 MG tablet Take 300 mg by mouth 3 (three) times daily.    [name] losartan (COZAAR) 50 MG tablet     . magnesium oxide (MAG-OX) 400 MG tablet Take 400 mg by mouth.    . mycophenolate (MYFORTIC) 180 MG EC tablet Take 720 mg by mouth 2 (two) times daily.    . predniSONE (DELTASONE) 5 MG tablet Take 5 mg by mouth daily.    . sodium chloride (OCEAN) 0.65 % SOLN nasal spray Place 1 spray into both nostrils as needed for congestion. 1 Bottle 3  . tacrolimus (PROGRAF) 1 MG capsule Take 7 mg by mouth 2 (two) times daily.      No current facility-administered medications for this visit.    ROS:   General:  No weight loss, Fever, chills  HEENT: No recent headaches, no nasal bleeding, no visual changes, no sore throat  Neurologic: No dizziness, blackouts, seizures. No recent symptoms of stroke or mini- stroke. No recent episodes of slurred speech, or temporary blindness.  Cardiac: No recent episodes of chest pain/pressure, no shortness of breath at rest.  + shortness of breath with exertion.  Denies history of atrial fibrillation or irregular heartbeat  Vascular: No history of rest pain in feet.  No history of claudication.  No history of non-healing ulcer, No history of DVT   Pulmonary: No home oxygen, no productive cough, no hemoptysis,  No asthma or wheezing  Musculoskeletal:  '[ ]'$  Arthritis, '[ ]'$  Low back pain,  '[ ]'$  Joint pain  Hematologic:No history of hypercoagulable state.  No history of easy bleeding.  No history of anemia  Gastrointestinal: No hematochezia or melena,   No gastroesophageal reflux, no trouble swallowing  Urinary: [x ] chronic Kidney disease, '[ ]'$  on HD - '[ ]'$  MWF or '[ ]'$  TTHS, '[ ]'$  Burning with urination, '[ ]'$  Frequent urination, '[ ]'$  Difficulty urinating;   Skin: No rashes  Psychological: No history of anxiety,  No history of depression   Physical Examination  Filed Vitals:   11/30/15 1051 11/30/15 1055  BP: 150/86 154/88  Pulse: 59  Height: '5\' 11"'$  (1.803 m)   Weight: 299 lb (135.626 kg)   SpO2: 63%     Body mass index is 41.72 kg/(m^2).  General:  Alert and oriented, no acute distress HEENT: Normal Extremity Pulses:  2+ radial, brachial, Thrombosed left forearm and upper arm access no erythema no drainage no focal swelling no mass no fluctuance, similar findings right upper extremity no areas of point tenderness Musculoskeletal: No deformity or edema  Neurologic: Upper and lower extremity motor 5/5 and symmetric  ASSESSMENT:  Pain left upper extremity over areas of previously placed access procedures. No evidence of infection.   PLAN:  Discussed with patient today that we can consider removal of the Graspit this may just replace 1 pain with another. Since her symptoms overall are fairly mild believe the best course of action for analysis occasional Tylenol if she needs it for pain. If she develops any pointing signs of infection we would consider graft removal. Also if her symptoms become worse over time we would consider removal. Otherwise she will follow-up on as-needed basis. I did order physical therapy for her today to see if there are some adhesions or scar tissue in this area that may be relieved by some manipulation.   Ruta Hinds, MD [facility] Office: [phone] Pager: [phone]

## 2015-12-08 DIAGNOSIS — Z940 Kidney transplant status: Secondary | ICD-10-CM | POA: Diagnosis not present

## 2015-12-11 DIAGNOSIS — Z794 Long term (current) use of insulin: Secondary | ICD-10-CM | POA: Diagnosis not present

## 2015-12-11 DIAGNOSIS — Z720 Tobacco use: Secondary | ICD-10-CM | POA: Diagnosis not present

## 2015-12-11 DIAGNOSIS — E139 Other specified diabetes mellitus without complications: Secondary | ICD-10-CM | POA: Diagnosis not present

## 2015-12-11 DIAGNOSIS — Z6841 Body Mass Index (BMI) 40.0 and over, adult: Secondary | ICD-10-CM | POA: Diagnosis not present

## 2015-12-13 ENCOUNTER — Encounter: Payer: Self-pay | Admitting: Internal Medicine

## 2015-12-13 ENCOUNTER — Other Ambulatory Visit (HOSPITAL_COMMUNITY)
Admission: RE | Admit: 2015-12-13 | Discharge: 2015-12-13 | Disposition: A | Discharge status: Home / Self Care | Payer: Medicare Other | Source: Ambulatory Visit | Attending: Internal Medicine | Admitting: Internal Medicine

## 2015-12-13 ENCOUNTER — Ambulatory Visit (INDEPENDENT_AMBULATORY_CARE_PROVIDER_SITE_OTHER): Payer: Medicare Other | Admitting: Internal Medicine

## 2015-12-13 VITALS — BP 149/84 | HR 65 | Temp 98.4°F | Ht 71.0 in | Wt 299.0 lb

## 2015-12-13 DIAGNOSIS — Z124 Encounter for screening for malignant neoplasm of cervix: Secondary | ICD-10-CM

## 2015-12-13 DIAGNOSIS — Z0000 Encounter for general adult medical examination without abnormal findings: Secondary | ICD-10-CM

## 2015-12-13 DIAGNOSIS — Z1151 Encounter for screening for human papillomavirus (HPV): Secondary | ICD-10-CM | POA: Insufficient documentation

## 2015-12-13 DIAGNOSIS — Z01419 Encounter for gynecological examination (general) (routine) without abnormal findings: Secondary | ICD-10-CM | POA: Diagnosis not present

## 2015-12-13 NOTE — Assessment & Plan Note (Signed)
Pt presents for screening pap smear. Cervix wnl, no discharge or tenderness. Sent in pap smear for cytology and HPV.

## 2015-12-13 NOTE — Progress Notes (Signed)
Subjective:   Patient ID: [name] female   DOB: [birth year] 50 y.o.   MRN: [id number]  HPI: [name] is a 50 y.o. with past medical history as outlined below who presents to clinic for screening pap smear. She has no complaints, denies hx of abnormal pap smears.   Please see problem list for status of the pt's chronic medical problems.  Past Medical History  Diagnosis Date  . 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 Hypertension   . 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 GERD (gastroesophageal reflux disease)     medication induced   Current Outpatient Prescriptions  Medication Sig Dispense Refill  . ACCU-CHEK FASTCLIX LANCETS MISC 1 each by Does not apply route 4 (four) times daily. Dx code- 250.00 204 each 12  . Alcohol Swabs (SM ALCOHOL PREP) 70 % PADS     . amLODipine (NORVASC) 10 MG tablet Take 10 mg by mouth daily.    [name] atorvastatin (LIPITOR) 10 MG tablet Take 10 mg by mouth.    . Blood Glucose Monitoring Suppl (ACCU-CHEK NANO SMARTVIEW) W/DEVICE KIT Check blood sugar 4 times a day 1 kit 0  . cloNIDine (CATAPRES) 0.3 MG tablet Take 1 tablet (0.3 mg total) by mouth 3 (three) times daily. 90 tablet 2  . famotidine (PEPCID) 20 MG tablet 40 mg daily with breakfast.    . glucose blood (ACCU-CHEK SMARTVIEW) test strip TEST THREE TIMES DAILY BEFORE MEALS AND AT BEDTIME 1 each 11  . insulin aspart (NOVOLOG FLEXPEN) 100 UNIT/ML FlexPen Inject 12 Units into the skin 3 (three) times daily with meals. 30 mL 11  . Insulin Glargine (LANTUS SOLOSTAR) 100 UNIT/ML Solostar Pen Inject 72 Units into the skin at bedtime. 30 mL 11  . Insulin Pen Needle 31G X 5 MM MISC Use to inject insulin 4 times a  day dx code 250.00 200 each 6  . labetalol (NORMODYNE) 300 MG tablet Take 300 mg by mouth 3 (three) times daily.    [name] losartan (COZAAR) 50 MG tablet     . magnesium oxide (MAG-OX) 400 MG tablet Take 400 mg by mouth.    . mycophenolate (MYFORTIC) 180 MG EC tablet Take 720 mg by mouth 2 (two) times daily.    . predniSONE (DELTASONE) 5 MG tablet Take 5 mg by mouth daily.    . sodium chloride (OCEAN) 0.65 % SOLN nasal spray Place 1 spray into both nostrils as needed for congestion. 1 Bottle 3  . tacrolimus (PROGRAF) 1 MG capsule Take 7 mg by mouth 2 (two) times daily.      No current facility-administered medications for this visit.   Family History  Problem Relation Age of Onset  . Hypertension Mother   . Diabetes Mother   . Atrial fibrillation Father   . Hypertension Father   . Arthritis Father   . Heart failure Sister   . Hypertension Sister     all 4 sisters  . Hypertension Brother     both brothers  . Diabetes Brother    Social History   Social History  . Marital Status: Single    Spouse Name: N/A  . Number of  Children: N/A  . Years of Education: 14   Occupational History  .  Unemployed   Social History Main Topics  . Smoking status: Current Some Day Smoker -- 0.20 packs/day    Types: Cigarettes  . Smokeless tobacco: None     Comment:   Smoking a couple a week   . Alcohol Use: No  . Drug Use: No  . Sexual Activity: No   Other Topics Concern  . 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  Genitourinary: Negative.     Objective:  Physical Exam: Filed Vitals:   12/13/15 1421  BP: 149/84  Pulse: 65    Temp: 98.4 F (36.9 C)  TempSrc: Oral  Height: '5\' 11"'$  (1.803 m)  Weight: 299 lb (135.626 kg)  SpO2: 100%   Physical Exam  Constitutional: She appears well-developed and well-nourished. No distress.  HENT:  Head: Normocephalic and atraumatic.  Nose: Nose normal.  Genitourinary: Vagina normal. No labial fusion. There is no rash, tenderness, lesion or injury on the right labia. There is no rash, tenderness, lesion or injury on the left labia. Cervix exhibits no motion tenderness, no discharge and no friability. Right adnexum displays no mass, no tenderness and no fullness. Left adnexum displays no mass, no tenderness and no fullness. No erythema, tenderness or bleeding in the vagina. No foreign body around the vagina. No signs of injury around the vagina. No vaginal discharge found.  Skin: She is not diaphoretic.   Assessment & Plan:   Please see problem based assessment and plan.

## 2015-12-14 LAB — CYTOLOGY - PAP

## 2015-12-14 NOTE — Progress Notes (Signed)
Internal Medicine Clinic Attending  Case discussed with Dr. Truong at the time of the visit.  We reviewed the resident's history and exam and pertinent patient test results.  I agree with the assessment, diagnosis, and plan of care documented in the resident's note.  

## 2016-03-18 DIAGNOSIS — N2581 Secondary hyperparathyroidism of renal origin: Secondary | ICD-10-CM | POA: Diagnosis not present

## 2016-03-18 DIAGNOSIS — E118 Type 2 diabetes mellitus with unspecified complications: Secondary | ICD-10-CM | POA: Diagnosis not present

## 2016-03-18 DIAGNOSIS — Z940 Kidney transplant status: Secondary | ICD-10-CM | POA: Diagnosis not present

## 2016-03-18 DIAGNOSIS — N189 Chronic kidney disease, unspecified: Secondary | ICD-10-CM | POA: Diagnosis not present

## 2016-03-18 DIAGNOSIS — E785 Hyperlipidemia, unspecified: Secondary | ICD-10-CM | POA: Diagnosis not present

## 2016-03-21 DIAGNOSIS — R809 Proteinuria, unspecified: Secondary | ICD-10-CM | POA: Diagnosis not present

## 2016-03-21 DIAGNOSIS — E118 Type 2 diabetes mellitus with unspecified complications: Secondary | ICD-10-CM | POA: Diagnosis not present

## 2016-03-21 DIAGNOSIS — N182 Chronic kidney disease, stage 2 (mild): Secondary | ICD-10-CM | POA: Diagnosis not present

## 2016-03-21 DIAGNOSIS — I129 Hypertensive chronic kidney disease with stage 1 through stage 4 chronic kidney disease, or unspecified chronic kidney disease: Secondary | ICD-10-CM | POA: Diagnosis not present

## 2016-03-21 DIAGNOSIS — D631 Anemia in chronic kidney disease: Secondary | ICD-10-CM | POA: Diagnosis not present

## 2016-03-21 DIAGNOSIS — Z940 Kidney transplant status: Secondary | ICD-10-CM | POA: Diagnosis not present

## 2016-03-21 DIAGNOSIS — G4733 Obstructive sleep apnea (adult) (pediatric): Secondary | ICD-10-CM | POA: Diagnosis not present

## 2016-03-21 DIAGNOSIS — E669 Obesity, unspecified: Secondary | ICD-10-CM | POA: Diagnosis not present

## 2016-04-04 LAB — HEMOGLOBIN A1C: HEMOGLOBIN A1C: 7.3

## 2016-05-22 DIAGNOSIS — I120 Hypertensive chronic kidney disease with stage 5 chronic kidney disease or end stage renal disease: Secondary | ICD-10-CM | POA: Diagnosis not present

## 2016-05-22 DIAGNOSIS — D899 Disorder involving the immune mechanism, unspecified: Secondary | ICD-10-CM | POA: Diagnosis not present

## 2016-05-22 DIAGNOSIS — Z792 Long term (current) use of antibiotics: Secondary | ICD-10-CM | POA: Diagnosis not present

## 2016-05-22 DIAGNOSIS — Z4822 Encounter for aftercare following kidney transplant: Secondary | ICD-10-CM | POA: Diagnosis not present

## 2016-05-22 DIAGNOSIS — E785 Hyperlipidemia, unspecified: Secondary | ICD-10-CM | POA: Diagnosis not present

## 2016-05-22 DIAGNOSIS — Z79899 Other long term (current) drug therapy: Secondary | ICD-10-CM | POA: Diagnosis not present

## 2016-05-22 DIAGNOSIS — I10 Essential (primary) hypertension: Secondary | ICD-10-CM | POA: Diagnosis not present

## 2016-05-22 DIAGNOSIS — F17210 Nicotine dependence, cigarettes, uncomplicated: Secondary | ICD-10-CM | POA: Diagnosis not present

## 2016-05-22 DIAGNOSIS — Z23 Encounter for immunization: Secondary | ICD-10-CM | POA: Diagnosis not present

## 2016-05-22 DIAGNOSIS — N186 End stage renal disease: Secondary | ICD-10-CM | POA: Diagnosis not present

## 2016-05-22 DIAGNOSIS — E119 Type 2 diabetes mellitus without complications: Secondary | ICD-10-CM | POA: Diagnosis not present

## 2016-05-22 DIAGNOSIS — Z6837 Body mass index (BMI) 37.0-37.9, adult: Secondary | ICD-10-CM | POA: Diagnosis not present

## 2016-05-22 DIAGNOSIS — Z794 Long term (current) use of insulin: Secondary | ICD-10-CM | POA: Diagnosis not present

## 2016-05-22 DIAGNOSIS — E1122 Type 2 diabetes mellitus with diabetic chronic kidney disease: Secondary | ICD-10-CM | POA: Diagnosis not present

## 2016-05-22 DIAGNOSIS — D8989 Other specified disorders involving the immune mechanism, not elsewhere classified: Secondary | ICD-10-CM | POA: Diagnosis not present

## 2016-05-22 DIAGNOSIS — Z7952 Long term (current) use of systemic steroids: Secondary | ICD-10-CM | POA: Diagnosis not present

## 2016-05-22 DIAGNOSIS — Z940 Kidney transplant status: Secondary | ICD-10-CM | POA: Diagnosis not present

## 2016-06-04 DIAGNOSIS — Z940 Kidney transplant status: Secondary | ICD-10-CM | POA: Diagnosis not present

## 2016-06-13 DIAGNOSIS — Z940 Kidney transplant status: Secondary | ICD-10-CM | POA: Diagnosis not present

## 2016-06-13 DIAGNOSIS — R809 Proteinuria, unspecified: Secondary | ICD-10-CM | POA: Diagnosis not present

## 2016-06-13 DIAGNOSIS — E119 Type 2 diabetes mellitus without complications: Secondary | ICD-10-CM | POA: Diagnosis not present

## 2016-06-13 DIAGNOSIS — Z79899 Other long term (current) drug therapy: Secondary | ICD-10-CM | POA: Diagnosis not present

## 2016-06-13 DIAGNOSIS — R7989 Other specified abnormal findings of blood chemistry: Secondary | ICD-10-CM | POA: Diagnosis not present

## 2016-06-13 DIAGNOSIS — Z794 Long term (current) use of insulin: Secondary | ICD-10-CM | POA: Diagnosis not present

## 2016-06-13 DIAGNOSIS — T8611 Kidney transplant rejection: Secondary | ICD-10-CM | POA: Diagnosis not present

## 2016-06-27 DIAGNOSIS — Z79899 Other long term (current) drug therapy: Secondary | ICD-10-CM | POA: Diagnosis not present

## 2016-06-27 DIAGNOSIS — I10 Essential (primary) hypertension: Secondary | ICD-10-CM | POA: Diagnosis not present

## 2016-06-27 DIAGNOSIS — F17210 Nicotine dependence, cigarettes, uncomplicated: Secondary | ICD-10-CM | POA: Diagnosis not present

## 2016-06-27 DIAGNOSIS — Z4822 Encounter for aftercare following kidney transplant: Secondary | ICD-10-CM | POA: Diagnosis not present

## 2016-06-27 DIAGNOSIS — Z794 Long term (current) use of insulin: Secondary | ICD-10-CM | POA: Diagnosis not present

## 2016-06-27 DIAGNOSIS — E119 Type 2 diabetes mellitus without complications: Secondary | ICD-10-CM | POA: Diagnosis not present

## 2016-06-27 DIAGNOSIS — Z940 Kidney transplant status: Secondary | ICD-10-CM | POA: Diagnosis not present

## 2016-06-27 DIAGNOSIS — D8989 Other specified disorders involving the immune mechanism, not elsewhere classified: Secondary | ICD-10-CM | POA: Diagnosis not present

## 2016-06-27 DIAGNOSIS — Z7952 Long term (current) use of systemic steroids: Secondary | ICD-10-CM | POA: Diagnosis not present

## 2016-06-27 DIAGNOSIS — E785 Hyperlipidemia, unspecified: Secondary | ICD-10-CM | POA: Diagnosis not present

## 2016-06-27 DIAGNOSIS — R808 Other proteinuria: Secondary | ICD-10-CM | POA: Diagnosis not present

## 2016-06-27 DIAGNOSIS — R809 Proteinuria, unspecified: Secondary | ICD-10-CM | POA: Diagnosis not present

## 2016-07-10 DIAGNOSIS — Z5181 Encounter for therapeutic drug level monitoring: Secondary | ICD-10-CM | POA: Diagnosis not present

## 2016-07-10 DIAGNOSIS — R809 Proteinuria, unspecified: Secondary | ICD-10-CM | POA: Diagnosis not present

## 2016-07-10 DIAGNOSIS — Z4822 Encounter for aftercare following kidney transplant: Secondary | ICD-10-CM | POA: Diagnosis not present

## 2016-07-10 DIAGNOSIS — R7989 Other specified abnormal findings of blood chemistry: Secondary | ICD-10-CM | POA: Diagnosis not present

## 2016-07-10 DIAGNOSIS — T8611 Kidney transplant rejection: Secondary | ICD-10-CM | POA: Diagnosis not present

## 2016-07-10 DIAGNOSIS — Z79899 Other long term (current) drug therapy: Secondary | ICD-10-CM | POA: Diagnosis not present

## 2016-08-06 DIAGNOSIS — I10 Essential (primary) hypertension: Secondary | ICD-10-CM | POA: Diagnosis not present

## 2016-08-06 DIAGNOSIS — N186 End stage renal disease: Secondary | ICD-10-CM | POA: Diagnosis not present

## 2016-08-06 DIAGNOSIS — Z6835 Body mass index (BMI) 35.0-35.9, adult: Secondary | ICD-10-CM | POA: Diagnosis not present

## 2016-08-06 DIAGNOSIS — Z7952 Long term (current) use of systemic steroids: Secondary | ICD-10-CM | POA: Diagnosis not present

## 2016-08-06 DIAGNOSIS — Z940 Kidney transplant status: Secondary | ICD-10-CM | POA: Diagnosis not present

## 2016-08-06 DIAGNOSIS — D8989 Other specified disorders involving the immune mechanism, not elsewhere classified: Secondary | ICD-10-CM | POA: Diagnosis not present

## 2016-08-06 DIAGNOSIS — J069 Acute upper respiratory infection, unspecified: Secondary | ICD-10-CM | POA: Diagnosis not present

## 2016-08-06 DIAGNOSIS — D631 Anemia in chronic kidney disease: Secondary | ICD-10-CM | POA: Diagnosis not present

## 2016-08-06 DIAGNOSIS — F17210 Nicotine dependence, cigarettes, uncomplicated: Secondary | ICD-10-CM | POA: Diagnosis not present

## 2016-08-06 DIAGNOSIS — D649 Anemia, unspecified: Secondary | ICD-10-CM | POA: Diagnosis not present

## 2016-08-06 DIAGNOSIS — E785 Hyperlipidemia, unspecified: Secondary | ICD-10-CM | POA: Diagnosis not present

## 2016-08-06 DIAGNOSIS — D899 Disorder involving the immune mechanism, unspecified: Secondary | ICD-10-CM | POA: Diagnosis not present

## 2016-08-06 DIAGNOSIS — R809 Proteinuria, unspecified: Secondary | ICD-10-CM | POA: Diagnosis not present

## 2016-08-06 DIAGNOSIS — E669 Obesity, unspecified: Secondary | ICD-10-CM | POA: Diagnosis not present

## 2016-08-06 DIAGNOSIS — Z4822 Encounter for aftercare following kidney transplant: Secondary | ICD-10-CM | POA: Diagnosis not present

## 2016-08-06 DIAGNOSIS — E872 Acidosis: Secondary | ICD-10-CM | POA: Diagnosis not present

## 2016-08-06 DIAGNOSIS — E119 Type 2 diabetes mellitus without complications: Secondary | ICD-10-CM | POA: Diagnosis not present

## 2016-08-06 DIAGNOSIS — Z79899 Other long term (current) drug therapy: Secondary | ICD-10-CM | POA: Diagnosis not present

## 2016-08-06 DIAGNOSIS — I120 Hypertensive chronic kidney disease with stage 5 chronic kidney disease or end stage renal disease: Secondary | ICD-10-CM | POA: Diagnosis not present

## 2016-08-06 DIAGNOSIS — Z794 Long term (current) use of insulin: Secondary | ICD-10-CM | POA: Diagnosis not present

## 2016-08-06 DIAGNOSIS — E1122 Type 2 diabetes mellitus with diabetic chronic kidney disease: Secondary | ICD-10-CM | POA: Diagnosis not present

## 2016-08-09 DIAGNOSIS — N182 Chronic kidney disease, stage 2 (mild): Secondary | ICD-10-CM | POA: Diagnosis not present

## 2016-08-09 DIAGNOSIS — G4733 Obstructive sleep apnea (adult) (pediatric): Secondary | ICD-10-CM | POA: Diagnosis not present

## 2016-08-09 DIAGNOSIS — E785 Hyperlipidemia, unspecified: Secondary | ICD-10-CM | POA: Diagnosis not present

## 2016-08-09 DIAGNOSIS — T829XXA Unspecified complication of cardiac and vascular prosthetic device, implant and graft, initial encounter: Secondary | ICD-10-CM | POA: Diagnosis not present

## 2016-08-09 DIAGNOSIS — Z720 Tobacco use: Secondary | ICD-10-CM | POA: Diagnosis not present

## 2016-08-09 DIAGNOSIS — Z940 Kidney transplant status: Secondary | ICD-10-CM | POA: Diagnosis not present

## 2016-08-09 DIAGNOSIS — N2581 Secondary hyperparathyroidism of renal origin: Secondary | ICD-10-CM | POA: Diagnosis not present

## 2016-08-09 DIAGNOSIS — Z79899 Other long term (current) drug therapy: Secondary | ICD-10-CM | POA: Diagnosis not present

## 2016-08-09 DIAGNOSIS — E118 Type 2 diabetes mellitus with unspecified complications: Secondary | ICD-10-CM | POA: Diagnosis not present

## 2016-08-09 DIAGNOSIS — I129 Hypertensive chronic kidney disease with stage 1 through stage 4 chronic kidney disease, or unspecified chronic kidney disease: Secondary | ICD-10-CM | POA: Diagnosis not present

## 2016-08-09 DIAGNOSIS — R809 Proteinuria, unspecified: Secondary | ICD-10-CM | POA: Diagnosis not present

## 2016-08-19 DIAGNOSIS — Z720 Tobacco use: Secondary | ICD-10-CM | POA: Diagnosis not present

## 2016-08-19 DIAGNOSIS — Z794 Long term (current) use of insulin: Secondary | ICD-10-CM | POA: Diagnosis not present

## 2016-08-19 DIAGNOSIS — E669 Obesity, unspecified: Secondary | ICD-10-CM | POA: Diagnosis not present

## 2016-08-19 DIAGNOSIS — Z6834 Body mass index (BMI) 34.0-34.9, adult: Secondary | ICD-10-CM | POA: Diagnosis not present

## 2016-08-19 DIAGNOSIS — E139 Other specified diabetes mellitus without complications: Secondary | ICD-10-CM | POA: Diagnosis not present

## 2016-08-23 DIAGNOSIS — Z940 Kidney transplant status: Secondary | ICD-10-CM | POA: Diagnosis not present

## 2016-08-23 DIAGNOSIS — D631 Anemia in chronic kidney disease: Secondary | ICD-10-CM | POA: Diagnosis not present

## 2016-08-24 ENCOUNTER — Other Ambulatory Visit: Payer: Self-pay | Admitting: Internal Medicine

## 2016-11-05 DIAGNOSIS — D631 Anemia in chronic kidney disease: Secondary | ICD-10-CM | POA: Diagnosis not present

## 2016-11-05 DIAGNOSIS — Z940 Kidney transplant status: Secondary | ICD-10-CM | POA: Diagnosis not present

## 2016-11-26 ENCOUNTER — Encounter (HOSPITAL_COMMUNITY): Payer: Self-pay | Admitting: Emergency Medicine

## 2016-11-26 ENCOUNTER — Ambulatory Visit (HOSPITAL_COMMUNITY)
Admission: EM | Admit: 2016-11-26 | Discharge: 2016-11-26 | Disposition: A | Discharge status: Home / Self Care | Payer: Medicare Other | Attending: Family Medicine | Admitting: Family Medicine

## 2016-11-26 DIAGNOSIS — K0889 Other specified disorders of teeth and supporting structures: Secondary | ICD-10-CM

## 2016-11-26 DIAGNOSIS — K047 Periapical abscess without sinus: Secondary | ICD-10-CM

## 2016-11-26 MED ORDER — KETOROLAC TROMETHAMINE 60 MG/2ML IM SOLN
60.0000 mg | Freq: Once | INTRAMUSCULAR | Status: AC
Start: 2016-11-26 — End: 2016-11-26
  Administered 2016-11-26: 60 mg via INTRAMUSCULAR

## 2016-11-26 MED ORDER — CLINDAMYCIN HCL 300 MG PO CAPS
300.0000 mg | ORAL_CAPSULE | Freq: Three times a day (TID) | ORAL | 0 refills | Status: DC
Start: 2016-11-26 — End: 2017-02-12

## 2016-11-26 MED ORDER — IBUPROFEN 800 MG PO TABS: 800.0000 mg | ORAL_TABLET | Freq: Three times a day (TID) | ORAL | 0 refills | Status: DC

## 2016-11-26 MED ORDER — KETOROLAC TROMETHAMINE 60 MG/2ML IM SOLN
INTRAMUSCULAR | Status: AC
  Filled 2016-11-26: qty 2

## 2016-11-26 NOTE — ED Triage Notes (Signed)
The patient presented to the [facility] with a complaint of dental pain x 4 days. 

## 2016-11-26 NOTE — ED Provider Notes (Signed)
CSN: [id number]     Arrival date & time 11/26/16  1035 History   None    Chief Complaint  Patient presents with  . Dental Pain   (Consider location/radiation/quality/duration/timing/severity/associated sxs/prior Treatment) Patient c/o dental pain for 4 days.   The history is provided by the patient.  Dental Pain  Location:  Lower Lower teeth location:  19/LL 1st molar Quality:  Aching Severity:  Moderate Relieved by:  Nothing Worsened by:  Nothing   Past Medical History:  Diagnosis Date  . 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 GERD (gastroesophageal reflux disease)    medication induced  . Hypertension   . 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 [place]  . CHOLECYSTECTOMY    . DG AV DIALYSIS  SHUNT ACCESS EXIST*L* OR    . KIDNEY TRANSPLANT    . UMBILICAL HERNIA REPAIR     Family History  Problem Relation Age of Onset  . Hypertension Mother   . Diabetes Mother   . Atrial fibrillation Father   . Hypertension Father   . Arthritis Father   . Heart failure Sister   . Hypertension Sister        all 4 sisters  . Hypertension Brother        both brothers  . Diabetes Brother    Social History  Substance Use Topics  . Smoking status: Current Some Day Smoker    Packs/day: 0.20    Types: Cigarettes  . Smokeless tobacco: Not on file     Comment:   Smoking a couple a week   . Alcohol use No   OB History    No data available     Review of Systems  Constitutional: Negative.   HENT: Positive for dental problem.   Eyes: Negative.   Respiratory: Negative.   Cardiovascular: Negative.   Gastrointestinal:  Negative.   Endocrine: Negative.   Genitourinary: Negative.   Musculoskeletal: Negative.   Allergic/Immunologic: Negative.   Neurological: Negative.   Hematological: Negative.   Psychiatric/Behavioral: Negative.     Allergies  Infed [iron dextran]; Cefazolin; Ancef [cefazolin sodium]; Lisinopril; and Other  Home Medications   Prior to Admission medications   Medication Sig Start Date End Date Taking? Authorizing Provider  ACCU-CHEK FASTCLIX LANCETS MISC 1 each by Does not apply route 4 (four) times daily. Dx code- 250.00 12/20/11   Othella Boyer, MD  Alcohol Swabs (SM ALCOHOL PREP) 70 % PADS  10/28/12   [provider]  amLODipine (NORVASC) 10 MG tablet Take 10 mg by mouth daily.    [provider]  atorvastatin (LIPITOR) 10 MG tablet Take 10 mg by mouth.    [provider]  Blood Glucose Monitoring Suppl (ACCU-CHEK NANO SMARTVIEW) W/DEVICE KIT Check blood sugar 4 times a day 03/16/14   Bartholomew Crews, MD  clindamycin (CLEOCIN) 300 MG capsule Take 1 capsule (300 mg total) by mouth 3 (three) times daily. 11/26/16   Lysbeth Penner, FNP  cloNIDine (CATAPRES) 0.3 MG tablet Take 1 tablet (0.3 mg total) by mouth 3 (three) times daily. 04/20/14   Jessee Avers, MD  famotidine (  PEPCID) 20 MG tablet 40 mg daily with breakfast. 03/18/13   [provider]  glucose blood (ACCU-CHEK SMARTVIEW) test strip Use to test blood sugar three times daily before meals & at bedtime. Dx code E11.9 08/27/16   Norman Herrlich, MD  ibuprofen (ADVIL,MOTRIN) 800 MG tablet Take 1 tablet (800 mg total) by mouth 3 (three) times daily. 11/26/16   Lysbeth Penner, FNP  insulin aspart (NOVOLOG FLEXPEN) 100 UNIT/ML FlexPen Inject 12 Units into the skin 3 (three) times daily with meals. 10/31/15   Norman Herrlich, MD  Insulin Glargine (LANTUS SOLOSTAR) 100 UNIT/ML Solostar Pen Inject 72 Units into the skin at bedtime. 03/16/14   Norman Herrlich, MD  Insulin Pen Needle 31G X 5 MM MISC  Use to inject insulin 4 times a day dx code 250.00 02/18/14   Norman Herrlich, MD  labetalol (NORMODYNE) 300 MG tablet Take 300 mg by mouth 3 (three) times daily.    [provider]  losartan (COZAAR) 50 MG tablet  07/22/13   [provider]  magnesium oxide (MAG-OX) 400 MG tablet Take 400 mg by mouth.    [provider]  mycophenolate (MYFORTIC) 180 MG EC tablet Take 720 mg by mouth 2 (two) times daily.    [provider]  predniSONE (DELTASONE) 5 MG tablet Take 5 mg by mouth daily.    [provider]  sodium chloride (OCEAN) 0.65 % SOLN nasal spray Place 1 spray into both nostrils as needed for congestion. 03/23/14   Norman Herrlich, MD  tacrolimus (PROGRAF) 1 MG capsule Take 7 mg by mouth 2 (two) times daily.     [provider]   Meds Ordered and Administered this Visit   Medications  ketorolac (TORADOL) injection 60 mg (60 mg Intramuscular Given 11/26/16 1130)    BP (!) 151/83 (BP Location: Right Arm)   Pulse 65   Temp 98 F (36.7 C) (Oral)   Resp 18   SpO2 98%  No data found.   Physical Exam  Constitutional: She appears well-developed and well-nourished.  HENT:  Head: Normocephalic and atraumatic.  Right Ear: External ear normal.  Left Ear: External ear normal.  Mouth/Throat: Oropharynx is clear and moist.  Eyes: Conjunctivae and EOM are normal. Pupils are equal, round, and reactive to light.  Neck: Normal range of motion. Neck supple.  Cardiovascular: Normal rate, regular rhythm and normal heart sounds.   Pulmonary/Chest: Effort normal.  Abdominal: Soft. Bowel sounds are normal.  Nursing note and vitals reviewed.   Urgent Care Course     Procedures (including critical care time)  Labs Review Labs Reviewed - No data to display  Imaging Review No results found.   Visual Acuity Review  Right Eye Distance:   Left Eye Distance:   Bilateral Distance:    Right Eye Near:   Left Eye Near:    Bilateral Near:          MDM   1. Pain, dental   2. Dental abscess    Clindamycin 368m one po tid x 10 days #30 Motrin 8036mone po tid prn #30      OxLysbeth PennerFNP 11/26/16 1646

## 2016-12-13 DIAGNOSIS — D631 Anemia in chronic kidney disease: Secondary | ICD-10-CM | POA: Diagnosis not present

## 2016-12-13 DIAGNOSIS — E785 Hyperlipidemia, unspecified: Secondary | ICD-10-CM | POA: Diagnosis not present

## 2016-12-13 DIAGNOSIS — Z940 Kidney transplant status: Secondary | ICD-10-CM | POA: Diagnosis not present

## 2016-12-13 DIAGNOSIS — N2581 Secondary hyperparathyroidism of renal origin: Secondary | ICD-10-CM | POA: Diagnosis not present

## 2016-12-13 DIAGNOSIS — E118 Type 2 diabetes mellitus with unspecified complications: Secondary | ICD-10-CM | POA: Diagnosis not present

## 2016-12-17 ENCOUNTER — Encounter: Payer: Self-pay | Admitting: *Deleted

## 2016-12-18 DIAGNOSIS — G4733 Obstructive sleep apnea (adult) (pediatric): Secondary | ICD-10-CM | POA: Diagnosis not present

## 2016-12-18 DIAGNOSIS — E785 Hyperlipidemia, unspecified: Secondary | ICD-10-CM | POA: Diagnosis not present

## 2016-12-18 DIAGNOSIS — E118 Type 2 diabetes mellitus with unspecified complications: Secondary | ICD-10-CM | POA: Diagnosis not present

## 2016-12-18 DIAGNOSIS — I129 Hypertensive chronic kidney disease with stage 1 through stage 4 chronic kidney disease, or unspecified chronic kidney disease: Secondary | ICD-10-CM | POA: Diagnosis not present

## 2016-12-18 DIAGNOSIS — R809 Proteinuria, unspecified: Secondary | ICD-10-CM | POA: Diagnosis not present

## 2016-12-18 DIAGNOSIS — N182 Chronic kidney disease, stage 2 (mild): Secondary | ICD-10-CM | POA: Diagnosis not present

## 2016-12-18 DIAGNOSIS — T829XXA Unspecified complication of cardiac and vascular prosthetic device, implant and graft, initial encounter: Secondary | ICD-10-CM | POA: Diagnosis not present

## 2016-12-18 DIAGNOSIS — N2581 Secondary hyperparathyroidism of renal origin: Secondary | ICD-10-CM | POA: Diagnosis not present

## 2016-12-18 DIAGNOSIS — Z720 Tobacco use: Secondary | ICD-10-CM | POA: Diagnosis not present

## 2016-12-18 DIAGNOSIS — Z940 Kidney transplant status: Secondary | ICD-10-CM | POA: Diagnosis not present

## 2016-12-18 DIAGNOSIS — Z79899 Other long term (current) drug therapy: Secondary | ICD-10-CM | POA: Diagnosis not present

## 2017-01-16 DIAGNOSIS — Z1231 Encounter for screening mammogram for malignant neoplasm of breast: Secondary | ICD-10-CM | POA: Diagnosis not present

## 2017-01-16 DIAGNOSIS — Z940 Kidney transplant status: Secondary | ICD-10-CM | POA: Diagnosis not present

## 2017-02-12 ENCOUNTER — Ambulatory Visit: Payer: Medicare Other | Admitting: Dietician

## 2017-02-12 ENCOUNTER — Ambulatory Visit (INDEPENDENT_AMBULATORY_CARE_PROVIDER_SITE_OTHER): Payer: Medicare Other | Admitting: Internal Medicine

## 2017-02-12 ENCOUNTER — Encounter: Payer: Self-pay | Admitting: Internal Medicine

## 2017-02-12 VITALS — BP 114/72 | HR 70 | Temp 98.5°F | Ht 72.0 in | Wt 230.6 lb

## 2017-02-12 DIAGNOSIS — Z79899 Other long term (current) drug therapy: Secondary | ICD-10-CM | POA: Diagnosis not present

## 2017-02-12 DIAGNOSIS — E119 Type 2 diabetes mellitus without complications: Secondary | ICD-10-CM

## 2017-02-12 DIAGNOSIS — Z0000 Encounter for general adult medical examination without abnormal findings: Secondary | ICD-10-CM

## 2017-02-12 DIAGNOSIS — F17210 Nicotine dependence, cigarettes, uncomplicated: Secondary | ICD-10-CM | POA: Diagnosis not present

## 2017-02-12 DIAGNOSIS — I10 Essential (primary) hypertension: Secondary | ICD-10-CM

## 2017-02-12 DIAGNOSIS — Z940 Kidney transplant status: Secondary | ICD-10-CM

## 2017-02-12 DIAGNOSIS — Z794 Long term (current) use of insulin: Secondary | ICD-10-CM

## 2017-02-12 DIAGNOSIS — E785 Hyperlipidemia, unspecified: Secondary | ICD-10-CM | POA: Diagnosis not present

## 2017-02-12 DIAGNOSIS — Z720 Tobacco use: Secondary | ICD-10-CM

## 2017-02-12 LAB — GLUCOSE, CAPILLARY: Glucose-Capillary: 99 mg/dL (ref 65–99)

## 2017-02-12 LAB — HM DIABETES EYE EXAM

## 2017-02-12 MED ORDER — GLUCOSE BLOOD VI STRP: ORAL_STRIP | 11 refills | Status: DC

## 2017-02-12 MED ORDER — INSULIN PEN NEEDLE 31G X 5 MM MISC: 6 refills | Status: DC

## 2017-02-12 MED ORDER — ACCU-CHEK FASTCLIX LANCETS MISC: 1.0000 | Freq: Four times a day (QID) | 12 refills | Status: DC

## 2017-02-12 NOTE — Assessment & Plan Note (Signed)
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 Patient follows with [facility] and states she is doing since the transplant. Cr 2.45.   Current medications: Tacrolimus 4 mg BID Mycophenolate 720 mg BID Prednisone 2.5 mg daily

## 2017-02-12 NOTE — Assessment & Plan Note (Addendum)
Lipid Profile as of 12/13/2016 Cholesterol 99 HDL 46 LDL 33 VLDL 18 TG 92  Assessment: Well controlled on Lipitor 10 mg daily   Plan: Continue Lipitor 10 mg daily

## 2017-02-12 NOTE — Assessment & Plan Note (Addendum)
HGBA1C 5.9 as of 12/13/2016. POC glucose 99 today.   Assessment:  Diabetes control: controlled Progress toward A1C goal: at goal Patient follows with Dr. Buddy Duty at [facility] End[facility]  Plan:  Continue current medications: Novolog 14 units or adjust as needed with meals, Lantus 62 units daily. Will not get A1c checked this visit because it was just checked 2 months ago.  Continue home glucose monitoring with meals.  Refilled the patient's test strips, pens, and lancets.

## 2017-02-12 NOTE — Assessment & Plan Note (Addendum)
BP today elevated today 148/89. I repeated the BP and it was 142/70. Her previous BP on 11/26/2016 was 151/83.   Assessment: Not well controlled on current regimen. 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 This requires strict BP control of less than 130/80. She states she is compliant with all her medications. We will recheck her BP at her next visit.   Plan:  Increase Losartan from 50 mg daily to 100 mg daily Continue Amlodipine 10 mg daily  Continue Labetalol 300 mg TID Continue Clonidine 0.2 mg BID Requesting for home blood pressure monitor for the patient. Referral in process.

## 2017-02-12 NOTE — Assessment & Plan Note (Signed)
Patient states she is only smoking 1-3 cigarettes a week She states if she is not around anyone smoking she will not have the desire to smoke. She did not have interest in a nicotine patch at this time.

## 2017-02-12 NOTE — Assessment & Plan Note (Signed)
Patient completed a mammography several weeks prior to her visit but forgot the report. Per patient it was normal. Patient is also due for a colonoscopy.  Assessment: Up to date on preventative screening except for colonoscopy screening.   Plan: Colonoscopy referral placed. Request for mammography imaging records.

## 2017-02-12 NOTE — Patient Instructions (Addendum)
Ms. Arrants,   Thank you for visiting [place] in clinic today!   Today we talked about increasing your Losartan from 50 mg once daily to 100 mg daily. You can take two 50 mg together or divide the dose. Whatever works best for you.   We also put in a referral for a colonoscopy.   Please follow up in 3 months so we can recheck your blood pressure.

## 2017-02-12 NOTE — Progress Notes (Addendum)
   CC: Routine Follow Up  HPI:  [name] is a 50 y.o. with past medical history documented as below presenting for routine follow up for her hypertension, DM, and hyperlipidemia. She states that she is doing very well and has no complaints at this time.   Past Medical History:  Diagnosis Date  . 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 GERD (gastroesophageal reflux disease)    medication induced  . Hypertension   . 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 Negative for chills, fever and malaise/fatigue.  HENT: Negative.   Eyes: Negative.  Negative for blurred vision and double vision.  Respiratory: Negative.  Negative for shortness of breath.   Cardiovascular: Negative.  Negative for chest pain.  Gastrointestinal: Negative.  Negative for abdominal pain, constipation, diarrhea, heartburn, nausea and vomiting.  Genitourinary: Negative.  Negative for dysuria, frequency and urgency.  Neurological: Negative.  Negative for tingling, sensory change, focal weakness, weakness and headaches.    Physical Exam:  Vitals:   02/12/17 1329 02/12/17 1427  BP: (!) 148/89 114/72  Pulse: 72 70  Temp: 98.5 F (36.9 C)   TempSrc: Oral   SpO2: 99%   Weight: 230 lb 9.6 oz (104.6 kg)   Height: 6' (1.829 m)    Physical Exam  Constitutional: She is oriented to person, place, and time. She appears well-developed and well-nourished. No distress.  HENT:  Head: Normocephalic and atraumatic.  Eyes: Conjunctivae and EOM are normal.  Neck: Normal range of motion. Neck supple.  Cardiovascular: Normal rate, regular rhythm,  normal heart sounds and intact distal pulses.   Pulmonary/Chest: Effort normal and breath sounds normal.  Abdominal: Soft. Bowel sounds are normal.  Musculoskeletal: Normal range of motion. She exhibits no edema.  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   Lymphadenopathy:    She has no cervical adenopathy.  Neurological: She is alert and oriented to person, place, and time.  Skin: Skin is warm and dry.  Psychiatric: She has a normal mood and affect. Her behavior is normal.    Assessment & Plan:   See Encounters Tab for problem based charting.  Patient seen with Dr. Lynnae January.

## 2017-02-13 ENCOUNTER — Other Ambulatory Visit: Payer: Self-pay | Admitting: *Deleted

## 2017-02-13 ENCOUNTER — Other Ambulatory Visit: Payer: Self-pay | Admitting: Dietician

## 2017-02-13 ENCOUNTER — Encounter: Payer: Self-pay | Admitting: Gastroenterology

## 2017-02-13 DIAGNOSIS — E119 Type 2 diabetes mellitus without complications: Secondary | ICD-10-CM

## 2017-02-13 NOTE — Progress Notes (Signed)
Retinal images done today per patient request.

## 2017-02-13 NOTE — Patient Outreach (Signed)
[place] [facility]) Care Management  02/13/2017  [name] 06-Dec-1965 [id number]  MD referral from [facility] [place] assistance with BP monitor for home monitoring  Per hx;  blood pressure not at goal with last 2 visits 11/26/16 (151/83) and 02/12/2017 9148/89) goal less than 130/80.   Telephone call to patient who was advised of [facility] care management services.  HIPPA verification received from patient.   Patient voices health conditions include diabetes mellitus, High blood pressure, and kidney transplant 2011.   States her major concern is keeping her blood pressure under control because she developed kidney failure due to high blood pressure requiring dialysis x 13 years. States she had kidney transplant in 2011 at [facility].  States she has not taken blood pressure in several months because monitor is broken & she has not been able to buy one.  States she will start seeing primary care every 3 months to monitor her blood pressure. States she goes to [place] [place] once yearly for transplant check up. States she also sees [facility] for kidney follow ups .   Patient voices that she manages her own medications & takes as directed by her doctors. States has no problems getting medications. States she gets speciality medications from [place] which are sent to her via FED EX and gets other medications from local pharmacy.  Patient states she is independent in her care . States she does drive but uses [facility] get to her Md appointments.     Per assessment patient has had 2 falls in last year.  Checks blood sugar three times daly (last A1c noted 5.9 on 12/13/2016). Wants to learn more about HTN & controlling it. Weight 230 lbs  ht-6'   Currently not checking BP because machine is broken. Lives with daughter.  Plan: Refer to care management assistant to assign to [facility] for complex case management ; BP not at goal, falls  risk , knowledge deficit regarding chronic condition.Sherrin Daisy, RN BSN [place] Management Coordinator [facility] Care Management  [phone]

## 2017-02-17 ENCOUNTER — Ambulatory Visit: Payer: Self-pay

## 2017-02-17 DIAGNOSIS — Z794 Long term (current) use of insulin: Secondary | ICD-10-CM | POA: Diagnosis not present

## 2017-02-17 DIAGNOSIS — E669 Obesity, unspecified: Secondary | ICD-10-CM | POA: Diagnosis not present

## 2017-02-17 DIAGNOSIS — Z720 Tobacco use: Secondary | ICD-10-CM | POA: Diagnosis not present

## 2017-02-17 DIAGNOSIS — E139 Other specified diabetes mellitus without complications: Secondary | ICD-10-CM | POA: Diagnosis not present

## 2017-02-17 DIAGNOSIS — Z6831 Body mass index (BMI) 31.0-31.9, adult: Secondary | ICD-10-CM | POA: Diagnosis not present

## 2017-02-17 NOTE — Progress Notes (Signed)
Internal Medicine Clinic Attending  I saw and evaluated the patient.  I personally confirmed the key portions of the history and exam documented by Dr. Aggie Hacker and I reviewed pertinent patient test results.  The assessment, diagnosis, and plan were formulated together and I agree with the documentation in the resident's note. The goal is to eventually taper off clonidine bc it is causing her fatigue.

## 2017-02-19 ENCOUNTER — Other Ambulatory Visit: Payer: Self-pay

## 2017-02-19 NOTE — Patient Outreach (Addendum)
[place] [facility]) Care Management  02/19/17  [name] 10-31-1965 [id number]  RNCM received referral from telephonic on 02/14/2017 for complex case management; BP not at goal, fall risk and knowledge deficit regarding chronic condition. Patient also needs a blood pressure cuff so she can manage her blood pressure at home.  Patient has a past medical history of diabetes (last A1C was 5.9 on 12/13/2016), hypertension, kidney transplant in 2011, HLD.  Successful outreach completed with patient. Patient identification verified. RNCM introduced self and explained role and patient was agreeable with outreach and home visit.  Patient reported that her primary concern is getting her blood pressure under control to save her kidney. She stated that she did not know she had blood pressure problems and that is what "destroyed my kidney." She stated that was put on dialysis in 1997 and received a kidney transplant in 2011. Her last few blood pressure readings at doctor's office have been high. She stated that the goal for her blood pressure readings is 130/80 or below. She does not have a blood pressure cuff at home and is therefore unable to monitor it at home. RNCM to assist with getting a monitor for her and patient was appreciative.   Patient currently denies any pain and denies any headaches as well.   Fall Risk  02/19/2017 02/13/2017 02/12/2017 12/13/2015 10/30/2015  Falls in the past year? Yes Yes Yes Yes Yes  Number falls in past yr: 2 or more 2 or more 2 or more 1 1  Comment - - - - -  Injury with Fall? No No No No No  Risk Factor Category  High Fall Risk High Fall Risk High Fall Risk - -  Risk for fall due to : History of fall(s) - Other (Comment) History of fall(s) History of fall(s)  Risk for fall due to: Comment - - Tripped on stairs. - -  Follow up Falls prevention discussed;Follow up appointment Falls prevention discussed Falls prevention discussed - Falls prevention discussed   Comment - - - - slipped in water   Depression screen [facility] 2/9 02/19/2017 02/13/2017 02/12/2017 12/13/2015 10/30/2015  Decreased Interest 0 0 0 0 0  Down, Depressed, Hopeless 0 0 0 0 0  PHQ - 2 Score 0 0 0 0 0    Plan:   [facility] CM Care Plan Problem One     Most Recent Value  Care Plan Problem One  Knowledge deficit related to diagnosis of hypertension as evidenced by verbalization of the problem and request for more information to help keep it under control  Role Documenting the Problem One  Care Management [place] for Problem One  Active  [facility] Long Term Goal   Patient will reduce blood pressure to goal of 130/90 within the next 90 days as evidenced by consistent readings at goal level  [facility] Long Term Goal Start Date  02/19/17  Interventions for Problem One Long Term Goal  RNCM to provide education about disease process and how to better manage blood pressure in the home. RNCM to help patient with getting a blood pressure cuff at home and will encourage that she check her blood pressure daily.   [facility] CM Short Term Goal #1   Patient will be able to demonstrate how to take a proper blood pressure measurement within the next 2 weeks  [facility] CM Short Term Goal #1 Start Date  02/19/17  Interventions for Short Term Goal #1  RNCM to assist patient with a  blood pressure within the next week. RNCM will educate patient on how to monitor her blood pressure at home and will provide patient with a log so that she can document her blood pressures.  [facility] CM Short Term Goal #2   Patient will be able to verbalize signs and symptoms that would require an urgent visit to the physician (a persistent headache, a sudden spike in blood pressure, dizziness, chest pain and fainting) within the next 2 weeks  [facility] CM Short Term Goal #2 Start Date  02/19/17  Interventions for Short Term Goal #2  RNCM to provide education on signs and symptoms, as well as importance of early detection and treatment to prevent additional  damage to kidneys.  [facility] CM Short Term Goal #3  Patient will be able to verbalize what hypertension is and how it affects the heart, kidneys, brain and blood vessels within the next 30 days  [facility] CM Short Term Goal #3 Start Date  02/19/17     Home visit scheduled for next week with patient. RNCM to assist patient with getting a blood pressure cuff. RNCM provided patient with RNCM contact information and encouraged to call with any questions or concerns and patient verbalized understanding.  [place] R. Emilianna Barlowe, RN, BSN, [place] Management Coordinator [phone]

## 2017-02-25 ENCOUNTER — Other Ambulatory Visit: Payer: Self-pay

## 2017-03-07 NOTE — Patient Outreach (Signed)
[facility]) Care Management  [facility] Care Manager  Late entry for 02/25/2017  [name] Oct 10, 1965 [id number]  Home visit completed with patient.   Subjective: Patient stated she is doing ok.  Objective: Blood pressure 123/71, pulse 67, last menstrual period 11/05/2012.  Encounter Medications:  Outpatient Encounter Prescriptions as of 02/25/2017  Medication Sig Note  . ACCU-CHEK FASTCLIX LANCETS MISC 1 each by Does not apply route 4 (four) times daily. Dx code- 250.00   . Alcohol Swabs (SM ALCOHOL PREP) 70 % PADS    . amLODipine (NORVASC) 10 MG tablet Take 10 mg by mouth daily.   [name] atorvastatin (LIPITOR) 10 MG tablet Take 10 mg by mouth. 10/30/2015: Received from: [facility]  . Blood Glucose Monitoring Suppl (ACCU-CHEK NANO SMARTVIEW) W/DEVICE KIT Check blood sugar 4 times a day   . cloNIDine (CATAPRES) 0.2 MG tablet Take 0.2 mg by mouth 3 (three) times daily.   . famotidine (PEPCID) 20 MG tablet 40 mg daily with breakfast.   . glucose blood (ACCU-CHEK SMARTVIEW) test strip Use to test blood sugar three times daily before meals & at bedtime. Dx code E11.9   . ibuprofen (ADVIL,MOTRIN) 800 MG tablet Take 1 tablet (800 mg total) by mouth 3 (three) times daily.   . insulin aspart (NOVOLOG FLEXPEN) 100 UNIT/ML FlexPen Inject 12 Units into the skin 3 (three) times daily with meals.   . Insulin Glargine (LANTUS SOLOSTAR) 100 UNIT/ML Solostar Pen Inject 72 Units into the skin at bedtime.   . Insulin Pen Needle 31G X 5 MM MISC Use to inject insulin 4 times a day dx code 250.00   . labetalol (NORMODYNE) 300 MG tablet Take 300 mg by mouth 3 (three) times daily.   [name] losartan (COZAAR) 50 MG tablet  07/28/2013: Received from: External Pharmacy  . magnesium oxide (MAG-OX) 400 MG tablet Take 400 mg by mouth. 10/30/2015: Received from: [facility]  . mycophenolate (MYFORTIC) 180 MG EC tablet Take 720 mg by mouth 2 (two) times daily.   .  predniSONE (DELTASONE) 2.5 MG tablet Take 2.5 mg by mouth daily.   . sodium chloride (OCEAN) 0.65 % SOLN nasal spray Place 1 spray into both nostrils as needed for congestion.   . tacrolimus (PROGRAF) 1 MG capsule Take 4 mg by mouth.    No facility-administered encounter medications on file as of 02/25/2017.     Functional Status:  In your present state of health, do you have any difficulty performing the following activities: 02/25/2017 02/19/2017  Hearing? - N  Vision? - N  Difficulty concentrating or making decisions? - N  Walking or climbing stairs? - N  Dressing or bathing? - N  Doing errands, shopping? - N  Quarry manager and eating ? N -  Using the Toilet? N -  In the past six months, have you accidently leaked urine? N -  Do you have problems with loss of bowel control? N -  Managing your Medications? N -  Managing your Finances? N -  Housekeeping or managing your Housekeeping? N -  Some recent data might be hidden    Fall/Depression Screening: Fall Risk  02/19/2017 02/13/2017 02/12/2017  Falls in the past year? Yes Yes Yes  Number falls in past yr: 2 or more 2 or more 2 or more  Comment - - -  Injury with Fall? No No No  Risk Factor Category  High Fall Risk High Fall Risk High Fall Risk  Risk for fall due to : History of fall(s) - Other (Comment)  Risk for fall due to: Comment - - Tripped on stairs.  Follow up Falls prevention discussed;Follow up appointment Falls prevention discussed Falls prevention discussed  Comment - - -   PHQ 2/9 Scores 02/19/2017 02/13/2017 02/12/2017 12/13/2015 10/30/2015 04/20/2014 03/23/2014  PHQ - 2 Score 0 0 0 0 0 0 0    Assessment: RNCM provided patient with blood pressure cuff and demonstrated how to use. Patient was able to verbalize how to use the BP cuff. RNCM encouraged patient to take her blood pressure at least once a day. Provided a [facility] Calendar for patient as well and encouraged her to write her blood pressures in the HTN section.  RNCM  provided education about signs and symptoms of high blood pressure to watch for and when to call the doctor.  Discussed patient's diet briefly. Patient currently does not add any extra salt. Educated on how to help limit salt in diet in other ways and patient verbalized understanding.  Patient reported that she is currently taking all of her medications as prescribed and has no concerns at present.   Plan: Will continue to follow patient for hypertension and will follow up regarding readings. Patient encouraged to call with any needs/concerns.  Tracey R. Shana Zavaleta, RN, BSN, [place] Management Coordinator [phone]

## 2017-03-27 DIAGNOSIS — D631 Anemia in chronic kidney disease: Secondary | ICD-10-CM | POA: Diagnosis not present

## 2017-03-27 DIAGNOSIS — N2581 Secondary hyperparathyroidism of renal origin: Secondary | ICD-10-CM | POA: Diagnosis not present

## 2017-03-27 DIAGNOSIS — Z940 Kidney transplant status: Secondary | ICD-10-CM | POA: Diagnosis not present

## 2017-03-27 DIAGNOSIS — E118 Type 2 diabetes mellitus with unspecified complications: Secondary | ICD-10-CM | POA: Diagnosis not present

## 2017-04-02 ENCOUNTER — Ambulatory Visit (AMBULATORY_SURGERY_CENTER): Payer: Self-pay | Admitting: *Deleted

## 2017-04-02 VITALS — Ht 72.0 in | Wt 220.0 lb

## 2017-04-02 DIAGNOSIS — Z1211 Encounter for screening for malignant neoplasm of colon: Secondary | ICD-10-CM

## 2017-04-02 MED ORDER — NA SULFATE-K SULFATE-MG SULF 17.5-3.13-1.6 GM/177ML PO SOLN: ORAL | 0 refills | Status: DC

## 2017-04-02 NOTE — Progress Notes (Signed)
Patient denies any allergies to eggs or soy. Patient denies any problems with anesthesia/sedation. Patient denies any oxygen use at home and does not take any diet/weight loss medications. EMMI education assisgned to patient on colonoscopy, this was explained and instructions given to patient. 

## 2017-04-04 ENCOUNTER — Other Ambulatory Visit: Payer: Self-pay

## 2017-04-09 DIAGNOSIS — N183 Chronic kidney disease, stage 3 (moderate): Secondary | ICD-10-CM | POA: Diagnosis not present

## 2017-04-09 DIAGNOSIS — T829XXA Unspecified complication of cardiac and vascular prosthetic device, implant and graft, initial encounter: Secondary | ICD-10-CM | POA: Diagnosis not present

## 2017-04-09 DIAGNOSIS — E785 Hyperlipidemia, unspecified: Secondary | ICD-10-CM | POA: Diagnosis not present

## 2017-04-09 DIAGNOSIS — Z940 Kidney transplant status: Secondary | ICD-10-CM | POA: Diagnosis not present

## 2017-04-09 DIAGNOSIS — Z720 Tobacco use: Secondary | ICD-10-CM | POA: Diagnosis not present

## 2017-04-09 DIAGNOSIS — I129 Hypertensive chronic kidney disease with stage 1 through stage 4 chronic kidney disease, or unspecified chronic kidney disease: Secondary | ICD-10-CM | POA: Diagnosis not present

## 2017-04-09 DIAGNOSIS — Z79899 Other long term (current) drug therapy: Secondary | ICD-10-CM | POA: Diagnosis not present

## 2017-04-09 DIAGNOSIS — Z23 Encounter for immunization: Secondary | ICD-10-CM | POA: Diagnosis not present

## 2017-04-09 DIAGNOSIS — R809 Proteinuria, unspecified: Secondary | ICD-10-CM | POA: Diagnosis not present

## 2017-04-09 DIAGNOSIS — E118 Type 2 diabetes mellitus with unspecified complications: Secondary | ICD-10-CM | POA: Diagnosis not present

## 2017-04-09 DIAGNOSIS — N2581 Secondary hyperparathyroidism of renal origin: Secondary | ICD-10-CM | POA: Diagnosis not present

## 2017-04-14 ENCOUNTER — Encounter: Payer: Self-pay | Admitting: Internal Medicine

## 2017-04-16 ENCOUNTER — Ambulatory Visit (AMBULATORY_SURGERY_CENTER): Payer: Medicare Other | Admitting: Gastroenterology

## 2017-04-16 ENCOUNTER — Encounter: Payer: Self-pay | Admitting: Gastroenterology

## 2017-04-16 VITALS — BP 113/65 | HR 51 | Temp 96.2°F | Resp 15 | Ht 72.0 in | Wt 220.0 lb

## 2017-04-16 DIAGNOSIS — K635 Polyp of colon: Secondary | ICD-10-CM

## 2017-04-16 DIAGNOSIS — K5730 Diverticulosis of large intestine without perforation or abscess without bleeding: Secondary | ICD-10-CM | POA: Diagnosis not present

## 2017-04-16 DIAGNOSIS — D123 Benign neoplasm of transverse colon: Secondary | ICD-10-CM

## 2017-04-16 DIAGNOSIS — Z1211 Encounter for screening for malignant neoplasm of colon: Secondary | ICD-10-CM | POA: Diagnosis not present

## 2017-04-16 DIAGNOSIS — D125 Benign neoplasm of sigmoid colon: Secondary | ICD-10-CM | POA: Diagnosis not present

## 2017-04-16 DIAGNOSIS — D120 Benign neoplasm of cecum: Secondary | ICD-10-CM

## 2017-04-16 DIAGNOSIS — I10 Essential (primary) hypertension: Secondary | ICD-10-CM | POA: Diagnosis not present

## 2017-04-16 DIAGNOSIS — D124 Benign neoplasm of descending colon: Secondary | ICD-10-CM | POA: Diagnosis not present

## 2017-04-16 DIAGNOSIS — K5140 Inflammatory polyps of colon without complications: Secondary | ICD-10-CM | POA: Diagnosis not present

## 2017-04-16 DIAGNOSIS — K219 Gastro-esophageal reflux disease without esophagitis: Secondary | ICD-10-CM | POA: Diagnosis not present

## 2017-04-16 MED ORDER — SODIUM CHLORIDE 0.9 % IV SOLN: 500.0000 mL | INTRAVENOUS | Status: DC

## 2017-04-16 NOTE — Progress Notes (Signed)
Called to room to assist during endoscopic procedure.  Patient ID and intended procedure confirmed with present staff. Received instructions for my participation in the procedure from the performing physician.  

## 2017-04-16 NOTE — Op Note (Signed)
[facility] Patient Name: [name] Procedure Date: 04/16/2017 8:31 AM MRN: [id number] Endoscopist: Milus Banister , MD Age: 51 Referring MD:  Date of Birth: [birth year] Gender: Female Account #: [id number] Procedure:                Colonoscopy Indications:              Screening for colorectal malignant neoplasm Medicines:                Monitored Anesthesia Care Procedure:                Pre-Anesthesia Assessment:                           - Prior to the procedure, a History and Physical                            was performed, and patient medications and                            allergies were reviewed. The patient's tolerance of                            previous anesthesia was also reviewed. The risks                            and benefits of the procedure and the sedation                            options and risks were discussed with the patient.                            All questions were answered, and informed consent                            was obtained. Prior Anticoagulants: The patient has                            taken no previous anticoagulant or antiplatelet                            agents. ASA Grade Assessment: III - A patient with                            severe systemic disease. After reviewing the risks                            and benefits, the patient was deemed in                            satisfactory condition to undergo the procedure.                           After obtaining informed consent, the colonoscope  was passed under direct vision. Throughout the                            procedure, the patient's blood pressure, pulse, and                            oxygen saturations were monitored continuously. The                            Colonoscope was introduced through the anus and                            advanced to the the cecum, identified by                            appendiceal orifice  and ileocecal valve. The                            colonoscopy was performed without difficulty. The                            patient tolerated the procedure well. The quality                            of the bowel preparation was good. The ileocecal                            valve, appendiceal orifice, and rectum were                            photographed. Scope In: 8:35:49 AM Scope Out: 9:00:48 AM Scope Withdrawal Time: 0 hours 20 minutes 41 seconds  Total Procedure Duration: 0 hours 24 minutes 59 seconds  Findings:                 A 13 mm polyp was found in the cecum, close to the                            IC valve. The polyp was semi-pedunculated. The                            polyp was removed with a hot snare. Resection and                            retrieval were complete. jar 1                           Three sessile polyps were found in the sigmoid                            colon, descending colon and transverse colon. The                            polyps were 2 to 4 mm in  size. These polyps were                            removed with a cold snare. Resection and retrieval                            were complete. jar 2                           A 15 mm polyp was found in the sigmoid colon. The                            polyp was semi-pedunculated. The polyp was removed                            with a hot snare. Resection and retrieval were                            complete. jar 3                           Multiple small-mouthed diverticula were found in                            the left colon.                           The exam was otherwise without abnormality on                            direct and retroflexion views. Complications:            No immediate complications. Estimated blood loss:                            None. Estimated Blood Loss:     Estimated blood loss: none. Impression:               - One 13 mm polyp in the cecum, removed with a hot                             snare. Resected and retrieved.                           - Three 2 to 4 mm polyps in the sigmoid colon, in                            the descending colon and in the transverse colon,                            removed with a cold snare. Resected and retrieved.                           - One 15 mm polyp in the sigmoid colon, removed  with a hot snare. Resected and retrieved.                           - Diverticulosis in the left colon.                           - The examination was otherwise normal on direct                            and retroflexion views. Recommendation:           - Patient has a contact number available for                            emergencies. The signs and symptoms of potential                            delayed complications were discussed with the                            patient. Return to normal activities tomorrow.                            Written discharge instructions were provided to the                            patient.                           - Resume previous diet.                           - Continue present medications.                           You will receive a letter within 2-3 weeks with the                            pathology results and my final recommendations.                           If the polyp(s) is proven to be 'pre-cancerous' on                            pathology, you will need repeat colonoscopy in 3                            years. If the polyp(s) is NOT 'precancerous' on                            pathology then you should repeat colon cancer                            screening in 10 years with colonoscopy without need  for colon cancer screening by any method prior to                            then (including stool testing). Milus Banister, MD 04/16/2017 9:04:37 AM This report has been signed electronically.

## 2017-04-16 NOTE — Progress Notes (Signed)
[name] for [facility] [employer] for ride. maw

## 2017-04-16 NOTE — Patient Instructions (Signed)
YOU HAD AN ENDOSCOPIC PROCEDURE TODAY AT [facility]:   Refer to the procedure report that was given to you for any specific questions about what was found during the examination.  If the procedure report does not answer your questions, please call your gastroenterologist to clarify.  If you requested that your care partner not be given the details of your procedure findings, then the procedure report has been included in a sealed envelope for you to review at your convenience later.  YOU SHOULD EXPECT: Some feelings of bloating in the abdomen. Passage of more gas than usual.  Walking can help get rid of the air that was put into your GI tract during the procedure and reduce the bloating. If you had a lower endoscopy (such as a colonoscopy or flexible sigmoidoscopy) you may notice spotting of blood in your stool or on the toilet paper. If you underwent a bowel prep for your procedure, you may not have a normal bowel movement for a few days.  Please Note:  You might notice some irritation and congestion in your nose or some drainage.  This is from the oxygen used during your procedure.  There is no need for concern and it should clear up in a day or so.  SYMPTOMS TO REPORT IMMEDIATELY:   Following lower endoscopy (colonoscopy or flexible sigmoidoscopy):  Excessive amounts of blood in the stool  Significant tenderness or worsening of abdominal pains  Swelling of the abdomen that is new, acute  Fever of 100F or higher   For urgent or emergent issues, a gastroenterologist can be reached at any hour by calling [phone].   DIET:  We do recommend a small meal at first, but then you may proceed to your regular diet.  Drink plenty of fluids but you should avoid alcoholic beverages for 24 hours.  ACTIVITY:  You should plan to take it easy for the rest of today and you should NOT DRIVE or use heavy machinery until tomorrow (because of the sedation medicines used during the test).     FOLLOW UP: Our staff will call the number listed on your records the next business day following your procedure to check on you and address any questions or concerns that you may have regarding the information given to you following your procedure. If we do not reach you, we will leave a message.  However, if you are feeling well and you are not experiencing any problems, there is no need to return our call.  We will assume that you have returned to your regular daily activities without incident.  If any biopsies were taken you will be contacted by phone or by letter within the next 1-3 weeks.  Please call us at [phone] if you have not heard about the biopsies in 3 weeks.    SIGNATURES/CONFIDENTIALITY: You and/or your care partner have signed paperwork which will be entered into your electronic medical record.  These signatures attest to the fact that that the information above on your After Visit Summary has been reviewed and is understood.  Full responsibility of the confidentiality of this discharge information lies with you and/or your care-partner.    Handouts were given to your care partner on polyps and diverticulosis. Your blood sugar was 131 in the recovery room. NO ASPIRIN, ASPIRIN CONTAINING PRODUCTS (BC OR GOODY POWDERS) OR NSAIDS (IBUPROFEN, ADVIL, ALEVE, AND MOTRIN) FOR 10 day; TYLENOL IS OK TO TAKE. You may resume your current medications today. Await biopsy  results. Please call if any questions or concerns.

## 2017-04-16 NOTE — Progress Notes (Signed)
Report given to PACU, vss 

## 2017-04-17 ENCOUNTER — Telehealth: Payer: Self-pay | Admitting: *Deleted

## 2017-04-17 NOTE — Telephone Encounter (Signed)
  Follow up Call-  Call back number 04/16/2017  Post procedure Call Back phone  # [phone]  Permission to leave phone message Yes  Some recent data might be hidden     Patient questions:  Do you have a fever, pain , or abdominal swelling? No. Pain Score  0 *  Have you tolerated food without any problems? Yes.    Have you been able to return to your normal activities? Yes.    Do you have any questions about your discharge instructions: Diet   No. Medications  No. Follow up visit  No.  Do you have questions or concerns about your Care? No.  Actions: * If pain score is 4 or above: No action needed, pain <4.

## 2017-04-22 ENCOUNTER — Encounter: Payer: Self-pay | Admitting: Gastroenterology

## 2017-04-28 LAB — HEMOGLOBIN A1C: HEMOGLOBIN A1C: 5.5

## 2017-05-21 ENCOUNTER — Ambulatory Visit (INDEPENDENT_AMBULATORY_CARE_PROVIDER_SITE_OTHER): Payer: Medicare Other | Admitting: Internal Medicine

## 2017-05-21 ENCOUNTER — Encounter: Payer: Self-pay | Admitting: Internal Medicine

## 2017-05-21 ENCOUNTER — Encounter (INDEPENDENT_AMBULATORY_CARE_PROVIDER_SITE_OTHER): Payer: Self-pay

## 2017-05-21 ENCOUNTER — Other Ambulatory Visit: Payer: Self-pay

## 2017-05-21 VITALS — BP 131/73 | HR 98 | Temp 98.0°F | Ht 72.0 in | Wt 217.0 lb

## 2017-05-21 DIAGNOSIS — Z79899 Other long term (current) drug therapy: Secondary | ICD-10-CM

## 2017-05-21 DIAGNOSIS — I10 Essential (primary) hypertension: Secondary | ICD-10-CM

## 2017-05-21 DIAGNOSIS — Z720 Tobacco use: Secondary | ICD-10-CM

## 2017-05-21 DIAGNOSIS — Z0000 Encounter for general adult medical examination without abnormal findings: Secondary | ICD-10-CM

## 2017-05-21 DIAGNOSIS — Z86010 Personal history of colonic polyps: Secondary | ICD-10-CM

## 2017-05-21 DIAGNOSIS — K5730 Diverticulosis of large intestine without perforation or abscess without bleeding: Secondary | ICD-10-CM

## 2017-05-21 DIAGNOSIS — E119 Type 2 diabetes mellitus without complications: Secondary | ICD-10-CM | POA: Diagnosis present

## 2017-05-21 DIAGNOSIS — F17210 Nicotine dependence, cigarettes, uncomplicated: Secondary | ICD-10-CM

## 2017-05-21 DIAGNOSIS — E1165 Type 2 diabetes mellitus with hyperglycemia: Secondary | ICD-10-CM

## 2017-05-21 DIAGNOSIS — Z794 Long term (current) use of insulin: Secondary | ICD-10-CM

## 2017-05-21 DIAGNOSIS — IMO0002 Reserved for concepts with insufficient information to code with codable children: Secondary | ICD-10-CM

## 2017-05-21 DIAGNOSIS — Z940 Kidney transplant status: Secondary | ICD-10-CM

## 2017-05-21 LAB — GLUCOSE, CAPILLARY: Glucose-Capillary: 117 mg/dL — ABNORMAL HIGH (ref 65–99)

## 2017-05-21 MED ORDER — LOSARTAN POTASSIUM 50 MG PO TABS: 100.0000 mg | ORAL_TABLET | Freq: Every day | ORAL | 3 refills | Status: DC

## 2017-05-21 NOTE — Assessment & Plan Note (Addendum)
Hemoglobin A1C 5.5 as of 03/27/2017. POC glucose 117  today. Patient follows with Dr. Buddy Duty at [facility]. Patient checks her BG three times daily before meals and states that her BG is never greater than 140 and never less than 70. Denies symptoms of hypoglycemia or hyperglycemia.   Assessment:  Diabetes well controlled.  Current medication regimen includes 30 units of Lantus at bed time and approximately 5-10 units of Novolog TID. This was recently decreased by the patient's endocrinologist.   Plan: -Continue current medications regimen.

## 2017-05-21 NOTE — Assessment & Plan Note (Signed)
BP Readings from Last 3 Encounters:  05/21/17 131/73  04/16/17 113/65  02/25/17 123/71   BP better controlled today in clinic. Patient was able to obtain a blood pressure cuff and has been monitoring her BP daily. Recordings from home range from 983-382 systolic and 50-53 diastolic. BP still not well controlled. Last visit patient was supposed to increase Losartan from 50 mg daily to 100 mg daily. She did this for about 1 week and then forgot to take the two tablets of 50 mg of Losartan. Patient needs strict control <130/80 due to transplanted kidney. Current regimen includes: Losartan 50 mg daily, Amlodipine 10 mg daily, Labetalol 300 mg BID, and Clonidine 0.2 mg TID.   Plan: Increase Losartan from 50 mg to 100 mg Continue to measure BP daily at home Instructed to call clinic in 1 month with BP readings RTC in 3-4 months pending BP control  If no response to increased losartan dose may be able to increase clonidine dose

## 2017-05-21 NOTE — Assessment & Plan Note (Signed)
Patient states she is intermittently smoking cigarettes, approximately 3-4 cigarette a week. She typically only smokes when around others that smoke. She successfully quit for five years but when her grandmother passed away started smoking again. She is motivated to quit and is interested in trying the nicotine gum.   Medication Samples have been provided to the patient.  Drug name: Nicotine Gum       Strength: 2 mg     Qty: 1 box  LOT: [id number] Exp.Date: 04/20  Dosing instructions: Chew 1 piece of gum every 1-2 hours as needed or whenever have an urge to smoke.  The patient has been instructed regarding the correct time, dose, and frequency of taking this medication, including desired effects and most common side effects.   [name] 5:01 PM 05/21/2017

## 2017-05-21 NOTE — Assessment & Plan Note (Signed)
Patient has appointment with transplant specialists tomorrow at [facility]. She follows closely with [facility] and recent labs showed an improved creatinine of 2.09 since last visit. Patient remains on Tacrolimus 4 mg BID, Mycophenolate 720 mg BID, and Prednisone 2.5 mg daily. She denies recent infections or illnesses, patient received her flu vaccination at [place] in September 2018.   Plan: Continue current treatment regimen

## 2017-05-21 NOTE — Progress Notes (Signed)
   CC: hypertension, type 2 diabetes  HPI:  [name] is a 51 y.o. female with past medical history as documented below presenting for follow up for hypertension, type 2 diabetes, and tobacco use. The patient has no acute complaints today.   Please see problem based charting for details of the patient's chronic medical problems.   Past Medical History:  Diagnosis Date  . Blood transfusion without reported diagnosis   . 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 GERD (gastroesophageal reflux disease)    medication induced  . Hypertension   . 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 Negative for chills, fever and malaise/fatigue.  Respiratory: Negative for shortness of breath.   Cardiovascular: Negative for chest pain.  Gastrointestinal: Negative for blood in stool, heartburn, nausea and vomiting.  Genitourinary: Negative for dysuria and hematuria.  Musculoskeletal: Negative for joint pain.  Neurological: Negative for tingling and weakness.  Psychiatric/Behavioral: Negative for depression.    Physical Exam:  Vitals:   05/21/17 1325  BP: 131/73  Pulse: 98  Temp: 98 F (36.7 C)  SpO2: 100%  Weight: 217 lb (98.4 kg)  Height: 6' (1.829 m)   Physical Exam  Constitutional: She is oriented to person, place, and time. She appears well-developed and well-nourished. No distress.  HENT:  Head: Normocephalic and atraumatic.  Mouth/Throat: Oropharynx is clear and moist.  Eyes: Conjunctivae and EOM are normal. Pupils are equal, round, and reactive to light.  Neck: Normal range of motion. Neck supple. No thyromegaly  present.  Cardiovascular: Normal rate, regular rhythm and normal heart sounds.  Pulmonary/Chest: Effort normal and breath sounds normal.  Abdominal: Soft. Bowel sounds are normal. There is no tenderness.  Musculoskeletal: Normal range of motion. She exhibits no edema.  Lymphadenopathy:    She has no cervical adenopathy.  Neurological: She is alert and oriented to person, place, and time. No cranial nerve deficit.  Skin: Skin is warm and dry.  Psychiatric: She has a normal mood and affect.    Assessment & Plan:   See Encounters Tab for problem based charting.  Patient seen with Dr. Rebeca Alert

## 2017-05-21 NOTE — Patient Instructions (Addendum)
Ms. Scroggins,   It was a pleasure seeing you today.   Increase your Losartan too 100 mg daily. This is TWO tablets of your current medication. You may run out of your medicine faster so please call clinic if you need refills.   Continue to take your blood pressure daily and record these daily. Please call clinic in 1 month and tell the nurses your blood pressures.   Please return to clinic in 4 months. You may need to come in sooner if your blood pressure is still not well controlled on the new dose of Losartan.

## 2017-05-21 NOTE — Assessment & Plan Note (Addendum)
Patient had colonoscopy on 04/16/2017.  Colonoscopy report summary: One 13 mm polyp in the cecum, three 2 to 4 mm polyps in the sigmoid colon, in the descending colon and in the transverse colon, and one 15 mm polyp in the sigmoid colon; all resected. Diverticulosis in the left colon. Examination was otherwise normal. Pathology report summary: Polyps were precancerous w/o high grade dysplasia or malignancy.   Plan: -Repeat colonoscopy 3 years.

## 2017-05-22 DIAGNOSIS — D899 Disorder involving the immune mechanism, unspecified: Secondary | ICD-10-CM | POA: Diagnosis not present

## 2017-05-22 DIAGNOSIS — F17210 Nicotine dependence, cigarettes, uncomplicated: Secondary | ICD-10-CM | POA: Diagnosis not present

## 2017-05-22 DIAGNOSIS — Z6835 Body mass index (BMI) 35.0-35.9, adult: Secondary | ICD-10-CM | POA: Diagnosis not present

## 2017-05-22 DIAGNOSIS — Z79899 Other long term (current) drug therapy: Secondary | ICD-10-CM | POA: Diagnosis not present

## 2017-05-22 DIAGNOSIS — I10 Essential (primary) hypertension: Secondary | ICD-10-CM | POA: Diagnosis not present

## 2017-05-22 DIAGNOSIS — Z4822 Encounter for aftercare following kidney transplant: Secondary | ICD-10-CM | POA: Diagnosis not present

## 2017-05-22 DIAGNOSIS — E669 Obesity, unspecified: Secondary | ICD-10-CM | POA: Diagnosis not present

## 2017-05-22 DIAGNOSIS — E119 Type 2 diabetes mellitus without complications: Secondary | ICD-10-CM | POA: Diagnosis not present

## 2017-05-22 DIAGNOSIS — Z794 Long term (current) use of insulin: Secondary | ICD-10-CM | POA: Diagnosis not present

## 2017-05-22 DIAGNOSIS — Z940 Kidney transplant status: Secondary | ICD-10-CM | POA: Diagnosis not present

## 2017-05-22 DIAGNOSIS — R809 Proteinuria, unspecified: Secondary | ICD-10-CM | POA: Diagnosis not present

## 2017-05-22 DIAGNOSIS — D649 Anemia, unspecified: Secondary | ICD-10-CM | POA: Diagnosis not present

## 2017-05-22 DIAGNOSIS — E785 Hyperlipidemia, unspecified: Secondary | ICD-10-CM | POA: Diagnosis not present

## 2017-05-22 DIAGNOSIS — R808 Other proteinuria: Secondary | ICD-10-CM | POA: Diagnosis not present

## 2017-05-22 DIAGNOSIS — D8989 Other specified disorders involving the immune mechanism, not elsewhere classified: Secondary | ICD-10-CM | POA: Diagnosis not present

## 2017-05-23 ENCOUNTER — Other Ambulatory Visit: Payer: Self-pay

## 2017-05-23 NOTE — Patient Outreach (Signed)
[facility]) Care Management  05/23/17  [name] 1966-03-07 [id number]  Successful outreach completed with patient. Patient identification verified.  Patient stated that she cannot really talk right now because things are hectic. She stated that she had a grandchild that was born this morning unexpectedly and it was not expected for another 2-3 weeks. She stated that she has her daughter's other two children with her now because her daughter is in the hospital.  She did state that she has been doing pretty good. She stated that her blood pressure has continued to be a little high and they made adjustments to her medications this week. She stated that she is still checking her blood pressure every day and she has noticed that it is fluctuating up and down a little. She denies any symptoms or concerns though.  Per records review, patient did have an appointment with her PCP on 05/21/2017. Her BP on that date was 131/73. It was noted that she has "excellent diabetes control and will continue to work on BP." It was also noted that patient desires to quit smoking. Her last A1C was 5.5 on 03/27/2017. Her BP was noted as better controlled. Ranges from home are 662-947 systolic and 65-46 diastolic. She was supposed to have increased her losartan from 50mg  to 100 mg daily and did it for a week but then forgot to take the 2 tablets of 50mg  losartan. Needs strict control of <130/80 due to transplanted kidney. At this visit, her Losartan was again increased to 100 mg daily, she is to call clinic in 1 month with BP readings and return in 3-4 months.  Plan: Home visit scheduled next month (in 3 weeks) to review BP readings and follow up since increase in dosage. Will follow up to ensure she is taking correct dosage of medication.  Tracey R. Aura Bibby, RN, BSN, [place] Management Coordinator [phone]

## 2017-05-23 NOTE — Progress Notes (Signed)
Internal Medicine Clinic Attending  I saw and evaluated the patient.  I personally confirmed the key portions of the history and exam documented by Dr. Aggie Hacker and I reviewed pertinent patient test results.  The assessment, diagnosis, and plan were formulated together and I agree with the documentation in the resident's note.  Renal transplant patient doing well overall. Excellent diabetes control, will continue to work on BP. Most importantly, wants to quit smoking again! Successfully quit for 5 years previously, so feels confident. Reviewed techniques and strategies to handle cravings as well as how to engage her hand or mouth actively when she would usually be smoking.  Oda Kilts, MD

## 2017-06-06 DIAGNOSIS — D631 Anemia in chronic kidney disease: Secondary | ICD-10-CM | POA: Diagnosis not present

## 2017-06-06 DIAGNOSIS — N183 Chronic kidney disease, stage 3 (moderate): Secondary | ICD-10-CM | POA: Diagnosis not present

## 2017-06-09 ENCOUNTER — Other Ambulatory Visit: Payer: Self-pay

## 2017-06-09 ENCOUNTER — Other Ambulatory Visit: Payer: Self-pay | Admitting: *Deleted

## 2017-06-09 ENCOUNTER — Encounter: Payer: Self-pay | Admitting: *Deleted

## 2017-06-09 NOTE — Patient Outreach (Signed)
[facility]) Care Management   06/09/2017  CALEEN TAAFFE Apr 12, 1966 [id number]  LAKINDRA WIBLE is an 51 y.o. female  Subjective:   Objective:   ROS  Physical Exam  Encounter Medications:   Outpatient Encounter Medications as of 06/09/2017  Medication Sig  . amLODipine (NORVASC) 10 MG tablet Take 10 mg by mouth daily.  [name] atorvastatin (LIPITOR) 10 MG tablet Take 10 mg by mouth.  . cloNIDine (CATAPRES) 0.2 MG tablet Take 0.2 mg by mouth 3 (three) times daily.  . famotidine (PEPCID) 20 MG tablet 40 mg daily with breakfast.  . insulin aspart (NOVOLOG) 100 UNIT/ML injection Inject into the skin.  [name] insulin glargine (LANTUS) 100 UNIT/ML injection Inject 30 Units at bedtime into the skin.  [name] iron polysaccharides (NIFEREX) 150 MG capsule Take 150 mg by mouth.  . labetalol (NORMODYNE) 300 MG tablet Take 300 mg by mouth 2 (two) times daily.   [name] losartan (COZAAR) 50 MG tablet Take 2 tablets (100 mg total) daily by mouth.  . magnesium oxide (MAG-OX) 400 MG tablet Take 400 mg by mouth.  . mycophenolate (MYFORTIC) 180 MG EC tablet Take 720 mg by mouth.  . predniSONE (DELTASONE) 2.5 MG tablet Take 2.5 mg daily by mouth.  [name] PROGRAF 1 MG capsule TAKE 4 CAPSULES BY MOUTH TWO TIMES DAILY   No facility-administered encounter medications on file as of 06/09/2017.     Functional Status:   In your present state of health, do you have any difficulty performing the following activities: 05/21/2017 02/25/2017  Hearing? N -  Vision? N -  Difficulty concentrating or making decisions? N -  Walking or climbing stairs? Y -  Dressing or bathing? N -  Doing errands, shopping? N -  Conservation officer, nature and eating ? - N  Using the Toilet? - N  In the past six months, have you accidently leaked urine? - N  Do you have problems with loss of bowel control? - N  Managing your Medications? - N  Managing your Finances? - N  Housekeeping or managing your Housekeeping? - N  Some recent data might be  hidden    Fall/Depression Screening:    Fall Risk  05/21/2017 02/19/2017 02/13/2017  Falls in the past year? No Yes Yes  Number falls in past yr: - 2 or more 2 or more  Comment - - -  Injury with Fall? - No No  Risk Factor Category  - High Fall Risk High Fall Risk  Risk for fall due to : - History of fall(s) -  Risk for fall due to: Comment - - -  Follow up - Falls prevention discussed;Follow up appointment Falls prevention discussed  Comment - - -   PHQ 2/9 Scores 05/21/2017 02/19/2017 02/13/2017 02/12/2017 12/13/2015 10/30/2015 04/20/2014  PHQ - 2 Score 0 0 0 0 0 0 0    Assessment:   Patient has been taking her medications and checking her blood pressures as she is supposed to do. Her readings remain a little elevated.  RNCM provided education about salt intake, how to read labels, and blood pressure. Patient stated that she does not use table salt all the time, but rarely uses it for seasoning. She reports and showed RNCM a lot no-salt added alternatives that she likes to use.  Patient spent much of the visit discussing concerns about finances. She stated that her income is slightly over $700 a month and her rent is over $500 a month. She indicated that she  has other bills to pay, so she has a lot of difficulty with having food in the home. She became tearful as she was discussing this, stating she could not keep her grandchildren because she does not have any food to feed them. She stated that her daughter lives with her and assists with groceries when she can.  Patient stated that she has tried a Building surveyor, but has a lot of difficulty getting there due to transportation.  Patient was agreeable to a SW referral.   RNCM provided meal for patient for lunch for today and will make a SW referral.   Plan: Patient to continue to check her blood pressure daily and will write it down in her [facility] book.  RNCM to follow up within the next month.  RNCM will make SW referral.  [place] R. Aliyana Dlugosz,  RN, BSN, [place] Management Coordinator [phone]

## 2017-06-09 NOTE — Patient Outreach (Addendum)
[facility]) Care Management  06/09/2017  NEEMA BARREIRA 04/02/1966 [id number]   CSW was able to make initial contact with patient today to perform phone assessment, as well as assess and assist with social work needs and services.  CSW introduced self, explained role and types of services provided through [facility] ([facility]).  CSW further explained to patient that CSW works with patient's RNCM, also with [facility], Tish [employer]. CSW then explained the reason for the call, indicating that [name] thought that patient would benefit from social work services and resources to assist with obtaining food resources, as well as financial resources to help pay for monthly expenses.  CSW obtained two HIPAA compliant identifiers from patient, which included patient's name and date of birth. Patient admits that she is on a very fixed income and runs out of money to buy food by the end of each month.  Patient also admits to struggling with paying her monthly bills.  CSW spoke with patient about Consumer Credit Counseling resources, as well as classes that she can take for budget management, but patient was not at all interested in attending classes or receiving information. CSW agreed to mail patient the following list of resource information: The [facility] in [place] in [place] and [place] In addition, [place] will mail patient a Copy from [employer] and a Consent for Treatment form.  CSW agreed to follow-up with patient next week to ensure that she received the information, as well as answer any questions she may have at that time.  Patient does not believe she will need assistance with the referral process.  No additional social work needs  have been identified at this time. Nat Christen, BSW, MSW, LCSW  Licensed Education officer, environmental Health System  Mailing [place] [address]. [address], [place], [place] [address] Physical Address-[address], [place], [place] [address] Toll Free Main # [phone] Fax # [phone] Cell # [phone]  Office # [phone] [email or web address]

## 2017-06-10 NOTE — Patient Outreach (Signed)
Request received from Joanna Saporito, LCSW to mail patient personal care resources.  Information mailed today. 

## 2017-06-19 ENCOUNTER — Other Ambulatory Visit: Payer: Self-pay | Admitting: *Deleted

## 2017-06-19 NOTE — Patient Outreach (Signed)
[facility]) Care Management  06/19/2017  [name] July 15, 1965 [id number]   CSW was able to make contact with patient today to follow-up regarding social work services and resources, as well as ensure that patient received the packet of resource information mailed to her home by CSW.  Patient confirmed that she received the packet today and has already had an opportunity to review it.  Patient questioned whether or not she would need a referral for obtaining food from the [employer] and [employer].  CSW denied, encouraging patient to take CSW's business card with her when she goes to obtain the food, which will be sufficient.  Patient voiced understanding and was agreeable to this plan.  Patient denied being able to identify any additional social work needs at present.  CSW was able to confirm that patient has the correct contact information for CSW, in the event that additional social work needs arise in the near future. CSW will perform a case closure on patient, as all goals of treatment have been met from social work standpoint and no additional social work needs have been identified at this time.  CSW will notify patient's RNCM with [facility], [facility] [employer] of CSW's plans to close patient's case.  CSW will fax an update to patient's Primary Care Physician, Dr. Rochele Pages to ensure that they are aware of CSW's involvement with patient's plan of care.  CSW will submit a case closure request to Alycia Rossetti, Care Management Assistant with [facility], in the form of an [employer].  CSW will ensure that [name] is aware of [name]'s, RNCM with [employer] [employer], continued involvement with patient's care. Nat Christen, BSW, MSW, LCSW  Licensed Education officer, environmental Health System  Mailing  [place] [address], [place], [place] [address] Physical Address-[address], [place], [place] [address] Toll Free Main # [phone] Fax # [phone] Cell # [phone]  Office # [phone] [email or web address]

## 2017-06-20 ENCOUNTER — Ambulatory Visit: Payer: Self-pay | Admitting: *Deleted

## 2017-06-23 ENCOUNTER — Ambulatory Visit: Payer: Self-pay

## 2017-07-16 ENCOUNTER — Other Ambulatory Visit: Payer: Self-pay | Admitting: *Deleted

## 2017-07-16 NOTE — Patient Outreach (Signed)
[facility]) Care Management  07/16/2017  GRACLYNN VANANTWERP [birth year] [id number]  Telephone call to patient; HIPPA verification received.  Patient has hx HTN & Kidney transplant . Has received services of community care coordinator for disease management of HTN.    Patient voices that she is currently taking blood pressure at least once daily.  States if elevated she will check again after she has taken BP medication. States most recent readings are 150/85, 138/87, 124/87 &166/94.  States she records all of readings and will take to MD appointment. States she exercise by going up & down her stairs and walking. Voices she is following low salt, low fat diet. Voices that she is managing her medications & takes them as prescribed by her MD.  States she feels confident with the information that she has learned to manage her chronic condition and does not need any further [facility] case management follow up. Agrees to closure of case.   Plan: Close case//send to care management assistant. Send MD closure letter.  Sherrin Daisy, RN BSN [facility] Coordinator [facility] Care Management  [phone]

## 2017-07-17 ENCOUNTER — Encounter: Payer: Self-pay | Admitting: *Deleted

## 2017-08-04 DIAGNOSIS — D631 Anemia in chronic kidney disease: Secondary | ICD-10-CM | POA: Diagnosis not present

## 2017-08-04 DIAGNOSIS — E118 Type 2 diabetes mellitus with unspecified complications: Secondary | ICD-10-CM | POA: Diagnosis not present

## 2017-08-04 DIAGNOSIS — N183 Chronic kidney disease, stage 3 (moderate): Secondary | ICD-10-CM | POA: Diagnosis not present

## 2017-08-04 DIAGNOSIS — E785 Hyperlipidemia, unspecified: Secondary | ICD-10-CM | POA: Diagnosis not present

## 2017-08-04 DIAGNOSIS — N2581 Secondary hyperparathyroidism of renal origin: Secondary | ICD-10-CM | POA: Diagnosis not present

## 2017-08-14 ENCOUNTER — Encounter: Payer: Self-pay | Admitting: Dietician

## 2017-08-21 DIAGNOSIS — E139 Other specified diabetes mellitus without complications: Secondary | ICD-10-CM | POA: Diagnosis not present

## 2017-08-21 DIAGNOSIS — E669 Obesity, unspecified: Secondary | ICD-10-CM | POA: Diagnosis not present

## 2017-08-21 DIAGNOSIS — Z794 Long term (current) use of insulin: Secondary | ICD-10-CM | POA: Diagnosis not present

## 2017-08-21 DIAGNOSIS — Z720 Tobacco use: Secondary | ICD-10-CM | POA: Diagnosis not present

## 2017-08-21 DIAGNOSIS — E1165 Type 2 diabetes mellitus with hyperglycemia: Secondary | ICD-10-CM | POA: Diagnosis not present

## 2017-09-02 DIAGNOSIS — R809 Proteinuria, unspecified: Secondary | ICD-10-CM | POA: Diagnosis not present

## 2017-09-02 DIAGNOSIS — Z720 Tobacco use: Secondary | ICD-10-CM | POA: Diagnosis not present

## 2017-09-02 DIAGNOSIS — E1122 Type 2 diabetes mellitus with diabetic chronic kidney disease: Secondary | ICD-10-CM | POA: Diagnosis not present

## 2017-09-02 DIAGNOSIS — N2581 Secondary hyperparathyroidism of renal origin: Secondary | ICD-10-CM | POA: Diagnosis not present

## 2017-09-02 DIAGNOSIS — Z79899 Other long term (current) drug therapy: Secondary | ICD-10-CM | POA: Diagnosis not present

## 2017-09-02 DIAGNOSIS — Z940 Kidney transplant status: Secondary | ICD-10-CM | POA: Diagnosis not present

## 2017-09-02 DIAGNOSIS — N183 Chronic kidney disease, stage 3 (moderate): Secondary | ICD-10-CM | POA: Diagnosis not present

## 2017-09-02 DIAGNOSIS — T829XXA Unspecified complication of cardiac and vascular prosthetic device, implant and graft, initial encounter: Secondary | ICD-10-CM | POA: Diagnosis not present

## 2017-09-02 DIAGNOSIS — E669 Obesity, unspecified: Secondary | ICD-10-CM | POA: Diagnosis not present

## 2017-09-02 DIAGNOSIS — I129 Hypertensive chronic kidney disease with stage 1 through stage 4 chronic kidney disease, or unspecified chronic kidney disease: Secondary | ICD-10-CM | POA: Diagnosis not present

## 2017-09-02 DIAGNOSIS — E785 Hyperlipidemia, unspecified: Secondary | ICD-10-CM | POA: Diagnosis not present

## 2017-09-12 LAB — HEMOGLOBIN A1C: Hemoglobin A1C: 5.3

## 2017-09-30 DIAGNOSIS — N2581 Secondary hyperparathyroidism of renal origin: Secondary | ICD-10-CM | POA: Diagnosis not present

## 2017-09-30 DIAGNOSIS — Z940 Kidney transplant status: Secondary | ICD-10-CM | POA: Diagnosis not present

## 2017-09-30 DIAGNOSIS — D631 Anemia in chronic kidney disease: Secondary | ICD-10-CM | POA: Diagnosis not present

## 2017-10-04 ENCOUNTER — Other Ambulatory Visit: Payer: Self-pay | Admitting: Internal Medicine

## 2017-10-04 DIAGNOSIS — I10 Essential (primary) hypertension: Secondary | ICD-10-CM

## 2017-12-02 DIAGNOSIS — N2581 Secondary hyperparathyroidism of renal origin: Secondary | ICD-10-CM | POA: Diagnosis not present

## 2017-12-02 DIAGNOSIS — I129 Hypertensive chronic kidney disease with stage 1 through stage 4 chronic kidney disease, or unspecified chronic kidney disease: Secondary | ICD-10-CM | POA: Diagnosis not present

## 2017-12-02 DIAGNOSIS — E118 Type 2 diabetes mellitus with unspecified complications: Secondary | ICD-10-CM | POA: Diagnosis not present

## 2017-12-02 DIAGNOSIS — Z940 Kidney transplant status: Secondary | ICD-10-CM | POA: Diagnosis not present

## 2017-12-02 DIAGNOSIS — E785 Hyperlipidemia, unspecified: Secondary | ICD-10-CM | POA: Diagnosis not present

## 2017-12-02 DIAGNOSIS — D631 Anemia in chronic kidney disease: Secondary | ICD-10-CM | POA: Diagnosis not present

## 2017-12-25 DIAGNOSIS — R809 Proteinuria, unspecified: Secondary | ICD-10-CM | POA: Diagnosis not present

## 2017-12-25 DIAGNOSIS — N183 Chronic kidney disease, stage 3 (moderate): Secondary | ICD-10-CM | POA: Diagnosis not present

## 2017-12-25 DIAGNOSIS — E118 Type 2 diabetes mellitus with unspecified complications: Secondary | ICD-10-CM | POA: Diagnosis not present

## 2017-12-25 DIAGNOSIS — E785 Hyperlipidemia, unspecified: Secondary | ICD-10-CM | POA: Diagnosis not present

## 2017-12-25 DIAGNOSIS — Z940 Kidney transplant status: Secondary | ICD-10-CM | POA: Diagnosis not present

## 2017-12-25 DIAGNOSIS — N2581 Secondary hyperparathyroidism of renal origin: Secondary | ICD-10-CM | POA: Diagnosis not present

## 2017-12-25 DIAGNOSIS — I129 Hypertensive chronic kidney disease with stage 1 through stage 4 chronic kidney disease, or unspecified chronic kidney disease: Secondary | ICD-10-CM | POA: Diagnosis not present

## 2017-12-25 DIAGNOSIS — E669 Obesity, unspecified: Secondary | ICD-10-CM | POA: Diagnosis not present

## 2017-12-25 DIAGNOSIS — T829XXA Unspecified complication of cardiac and vascular prosthetic device, implant and graft, initial encounter: Secondary | ICD-10-CM | POA: Diagnosis not present

## 2017-12-25 DIAGNOSIS — Z79899 Other long term (current) drug therapy: Secondary | ICD-10-CM | POA: Diagnosis not present

## 2017-12-25 DIAGNOSIS — Z720 Tobacco use: Secondary | ICD-10-CM | POA: Diagnosis not present

## 2018-01-21 LAB — HEMOGLOBIN A1C: Hemoglobin A1C: 4.8

## 2018-01-22 DIAGNOSIS — Z1231 Encounter for screening mammogram for malignant neoplasm of breast: Secondary | ICD-10-CM | POA: Diagnosis not present

## 2018-01-26 ENCOUNTER — Encounter: Payer: Self-pay | Admitting: *Deleted

## 2018-02-02 DIAGNOSIS — D631 Anemia in chronic kidney disease: Secondary | ICD-10-CM | POA: Diagnosis not present

## 2018-02-02 DIAGNOSIS — Z940 Kidney transplant status: Secondary | ICD-10-CM | POA: Diagnosis not present

## 2018-02-04 ENCOUNTER — Ambulatory Visit (INDEPENDENT_AMBULATORY_CARE_PROVIDER_SITE_OTHER): Payer: Medicare Other | Admitting: Internal Medicine

## 2018-02-04 ENCOUNTER — Encounter: Payer: Self-pay | Admitting: Internal Medicine

## 2018-02-04 VITALS — BP 125/67 | HR 66 | Temp 98.2°F | Wt 195.0 lb

## 2018-02-04 DIAGNOSIS — Z8249 Family history of ischemic heart disease and other diseases of the circulatory system: Secondary | ICD-10-CM

## 2018-02-04 DIAGNOSIS — Z720 Tobacco use: Secondary | ICD-10-CM

## 2018-02-04 DIAGNOSIS — Z940 Kidney transplant status: Secondary | ICD-10-CM

## 2018-02-04 DIAGNOSIS — Z23 Encounter for immunization: Secondary | ICD-10-CM

## 2018-02-04 DIAGNOSIS — F17211 Nicotine dependence, cigarettes, in remission: Secondary | ICD-10-CM | POA: Diagnosis not present

## 2018-02-04 DIAGNOSIS — T471X5A Adverse effect of other antacids and anti-gastric-secretion drugs, initial encounter: Secondary | ICD-10-CM

## 2018-02-04 DIAGNOSIS — K521 Toxic gastroenteritis and colitis: Secondary | ICD-10-CM | POA: Diagnosis not present

## 2018-02-04 DIAGNOSIS — Z0000 Encounter for general adult medical examination without abnormal findings: Secondary | ICD-10-CM

## 2018-02-04 DIAGNOSIS — Z79899 Other long term (current) drug therapy: Secondary | ICD-10-CM

## 2018-02-04 DIAGNOSIS — I10 Essential (primary) hypertension: Secondary | ICD-10-CM | POA: Diagnosis not present

## 2018-02-04 NOTE — Progress Notes (Signed)
CC: HTN, tobacco use follow up, diarrhea, health maintenance  HPI:  [name] is a 52 y.o. female with PMH below.  She is here for HTN, tobacco use follow up, diarrhea, health maintenance.  Please see A&P for status of the patient's chronic medical conditions  Past Medical History:  Diagnosis Date  . Blood transfusion without reported diagnosis   . 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 GERD (gastroesophageal reflux disease)    medication induced  . Hypertension   . 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  Cardiovascular: Normal rate, regular rhythm and normal heart sounds. Exam reveals no gallop and no friction rub.  No murmur heard. Pulmonary/Chest: Effort normal. No respiratory distress. She has no wheezes. She has no rales. She exhibits no tenderness.  Distant breath sounds  Abdominal: Soft. Bowel sounds are normal. She exhibits no distension and no mass. There is no tenderness. There is no rebound and no guarding.  Neurological: She is alert.  Skin: She is not diaphoretic.    Social History   Socioeconomic History  . Marital status: Single    Spouse name: Not on file  . Number of children: Not on file  . Years of education: 73   . Highest education level: Not on file  Occupational History    Employer: UNEMPLOYED  Social Needs  . Financial resource strain: Not on file  . Food insecurity:    Worry: Not on file    Inability: Not on file  . Transportation needs:    Medical: Not on file    Non-medical: Not on file  Tobacco Use  . Smoking status: Former Smoker    Packs/day: 0.50    Types: Cigarettes    Last attempt to quit: 11/05/2017    Years since quitting: 0.2  . Smokeless tobacco: Never Used  Substance and Sexual Activity  . Alcohol use: No    Alcohol/week: 0.0 oz  . Drug use: No  . Sexual activity: Never  Lifestyle  . Physical activity:    Days per week: Not on file    Minutes per session: Not on file  . Stress: Not on file  Relationships  . Social connections:    Talks on phone: Not on file    Gets together: Not on file    Attends religious service: Not on file    Active member of club or organization: Not on file    Attends meetings of clubs or organizations: Not on file    Relationship status: Not on file  . Intimate partner violence:    Fear of current or ex partner: Not on file    Emotionally abused: Not on file  Physically abused: Not on file    Forced sexual activity: Not on file  Other Topics Concern  . 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 Hypertension Mother   . Diabetes Mother   . Atrial fibrillation Father   . Hypertension Father   . Arthritis Father   . Heart failure Sister   . Hypertension Sister        all 4 sisters  . Hypertension Brother        both brothers  . Diabetes Brother   . Colon cancer  Neg Hx   . Stomach cancer Neg Hx     Assessment & Plan:   See Encounters Tab for problem based charting.  Patient discussed with Dr. Beryle Beams

## 2018-02-04 NOTE — Patient Instructions (Addendum)
[name], we will decrease your magnesium to 400mg  daily and 400mg  twice daily every other day.  If you still have occasional diarrhea please decrease this to 400mg  daily when you were not having symptoms.  We will give you the pneumonia shot prevnar 13 today.

## 2018-02-04 NOTE — Progress Notes (Signed)
Medicine attending: Medical history, presenting problems, physical findings, and medications, reviewed with resident physician Dr Guadlupe Spanish on the day of the patient visit and I concur with his evaluation and management plan. Not clear if her diarrhea is related to increased dose of magnesium oxide. We will reduce to once daily. Re-evaluate if sxs persist.

## 2018-02-04 NOTE — Assessment & Plan Note (Signed)
Patient reports that she quit smoking about 3 months ago the Nicorette gum worked well for her and she currently does not have any cravings to start back smoking.    -Praised patient for her accomplishment told her to let us know if the cravings return or she requires any additional assistance

## 2018-02-04 NOTE — Assessment & Plan Note (Signed)
Patient has been treated for hypomagnesemia by her nephrologist for some time with 400 mg of magnesium oxide daily.  Patient reports that in her last visit her nephrologist increased to 400 mg twice daily.  Since then she has experienced 2 or 3 loose stools around 1-2 times per week.  She denies any fevers or chills.  She denies any blood or abdominal pain.  It is more or less a nuisance for her and afterwards she feels drained.  She is also worried about getting dehydrated from it which could hurt her transplanted kidney.    -Instructed patient to try every other day dosing of the twice daily 400 mg, if this does not work then to go to the 400 mg daily -She consistently gets infectious labs due to her renal transplant and these have been negative for CMV, EBV, BK last labs were about 2 months prior which is reassuring.   -Patient given strict return precautions if diarrhea does not resolve with lowering back magnesium dose to consider other etiologies

## 2018-02-04 NOTE — Assessment & Plan Note (Signed)
BP Readings from Last 3 Encounters:  02/04/18 125/67  06/09/17 121/83  05/21/17 131/73   She has blood pressure well controlled today.  At last visit her losartan was increased from 50 mg to 100 mg.  Her creatinine has remained stable since that change.  She is also taking clonidine 0.2 mg 3 times daily, amlodipine 10 mg daily, labetalol 300 mg twice daily.  -Her blood pressure is excellent today we will continue this regimen

## 2018-02-04 NOTE — Assessment & Plan Note (Addendum)
Due to immunocompromise state with renal transplant I will vaccinate the patient with Prevnar 13

## 2018-03-16 DIAGNOSIS — Z794 Long term (current) use of insulin: Secondary | ICD-10-CM | POA: Diagnosis not present

## 2018-03-16 DIAGNOSIS — Z720 Tobacco use: Secondary | ICD-10-CM | POA: Diagnosis not present

## 2018-03-16 DIAGNOSIS — E139 Other specified diabetes mellitus without complications: Secondary | ICD-10-CM | POA: Diagnosis not present

## 2018-04-06 DIAGNOSIS — E118 Type 2 diabetes mellitus with unspecified complications: Secondary | ICD-10-CM | POA: Diagnosis not present

## 2018-04-06 DIAGNOSIS — E785 Hyperlipidemia, unspecified: Secondary | ICD-10-CM | POA: Diagnosis not present

## 2018-04-06 DIAGNOSIS — D631 Anemia in chronic kidney disease: Secondary | ICD-10-CM | POA: Diagnosis not present

## 2018-04-06 DIAGNOSIS — Z940 Kidney transplant status: Secondary | ICD-10-CM | POA: Diagnosis not present

## 2018-04-15 ENCOUNTER — Ambulatory Visit (INDEPENDENT_AMBULATORY_CARE_PROVIDER_SITE_OTHER): Payer: Medicare Other | Admitting: Pharmacist

## 2018-04-15 ENCOUNTER — Other Ambulatory Visit: Payer: Self-pay | Admitting: Pharmacist

## 2018-04-15 DIAGNOSIS — E119 Type 2 diabetes mellitus without complications: Secondary | ICD-10-CM

## 2018-04-15 NOTE — Patient Instructions (Signed)
Please record the time, amount and what food drinks and activities you have while wearing the continuous glucose monitor(CGM) in the folder provided.  Bring the folder with you to follow up appointments  Do not have a CT or an MRI while wearing the CGM.   Please make an appointment for 1 week with me and a doctor for the first of two CGM downloads..   You will also return in 2 weeks to have your second download and the CGM removed.  

## 2018-04-15 NOTE — Progress Notes (Signed)
Documentation for Freestyle Libre Pro Continuous glucose monitoring Freestyle Libre Pro CGM sensor placed and started on [name] who was identified by name and date of birth.  Patient was educated about wearing sensor, keeping food, activity and medication log and when to call office.She was educated about how to care for the sensor and not to have an MRI, CT or Diathermy while wearing the sensor. Follow up was arranged with the patient for 04/22/2018.  During the visit, I also dispensed a blood glucose monitor to [name], who needed a new one. A flu shot was offered, but she is already planning to get one at a future visit with another provider.   Lot #:[id number] Serial #:[id number]  Expiration Date:10/06/2018 Kathyrn Sheriff, Student-PharmD 04/15/2018 1:22 PM.

## 2018-04-17 ENCOUNTER — Encounter: Payer: Self-pay | Admitting: Pharmacist

## 2018-04-20 ENCOUNTER — Telehealth: Payer: Self-pay | Admitting: Dietician

## 2018-04-20 NOTE — Telephone Encounter (Signed)
returned her call- she just wanted to let us know the Continuous glucose monitoring sensor fell off Saturday. She will bring it with her Wednesday to her appointment

## 2018-04-22 ENCOUNTER — Other Ambulatory Visit: Payer: Self-pay

## 2018-04-22 ENCOUNTER — Ambulatory Visit: Payer: Medicare Other | Admitting: Internal Medicine

## 2018-04-22 ENCOUNTER — Ambulatory Visit (INDEPENDENT_AMBULATORY_CARE_PROVIDER_SITE_OTHER): Payer: Medicare Other | Admitting: Pharmacist

## 2018-04-22 VITALS — BP 146/79 | HR 69 | Temp 98.1°F | Ht 72.0 in | Wt 187.3 lb

## 2018-04-22 DIAGNOSIS — I10 Essential (primary) hypertension: Secondary | ICD-10-CM | POA: Diagnosis not present

## 2018-04-22 DIAGNOSIS — T471X5A Adverse effect of other antacids and anti-gastric-secretion drugs, initial encounter: Secondary | ICD-10-CM | POA: Diagnosis not present

## 2018-04-22 DIAGNOSIS — K521 Toxic gastroenteritis and colitis: Secondary | ICD-10-CM

## 2018-04-22 DIAGNOSIS — Z940 Kidney transplant status: Secondary | ICD-10-CM | POA: Diagnosis not present

## 2018-04-22 DIAGNOSIS — E119 Type 2 diabetes mellitus without complications: Secondary | ICD-10-CM | POA: Diagnosis not present

## 2018-04-22 NOTE — Progress Notes (Signed)
Documentation for Freestyle Libre Pro Continuous glucose monitoring Freestyle Libre Pro CGM sensor placed and started on [name] who was identified by name and date of birth.  Patient was educated about wearing sensor, keeping food, activity and medication log and when to call office.She was educated about how to care for the sensor and not to have an MRI, CT or Diathermy while wearing the sensor. Follow up was arranged with the patient for 04/29/2018.   The patient had previously had a CGM placed last week, but the sensor fell off this weekend. No data was able to be gathered from the sensor. A new sensor was placed today. Skin Tac and Tegaderm were utilized today.   Lot #:[id number] Serial #:[id number]  Expiration Date:11/05/2018 Kathyrn Sheriff, Student-PharmD 04/22/2018 1:06 PM.

## 2018-04-22 NOTE — Patient Instructions (Signed)
Please record the time, amount and what food drinks and activities you have while wearing the continuous glucose monitor(CGM) in the folder provided.  Bring the folder with you to follow up appointments  Do not have a CT or an MRI while wearing the CGM.   Please make an appointment for 1 week with me and a doctor for the first of two CGM downloads..   You will also return in 2 weeks to have your second download and the CGM removed.  

## 2018-04-22 NOTE — Progress Notes (Signed)
   CC: diarrhea   HPI:  [name] is a 52 y.o. female with PMHx T2DM, HTN, s/p renal transplant in 2011 presents for follow-up on diarrhea. At her last visit in July, it was noted that her symptoms began shortly after her nephrologist doubled the dosing of her magnesium oxide. It was recommended to the double dose every other day and monitor for symptom improvement. Since then, she has noticed decreased frequency in loose stools, but is still having them several days a week. She denies any abdominal pain, melena or hematochezia. BMs not associated with eating or certain dietary habits. Denies fevers, chills or unintentional weight loss.   Past Medical History:  Diagnosis Date  . Blood transfusion without reported diagnosis   . 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 GERD (gastroesophageal reflux disease)    medication induced  . Hypertension   . 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  Patient seen with Dr. Rebeca Alert

## 2018-04-22 NOTE — Patient Instructions (Addendum)
[name],  It was a pleasure meeting you today!  For your loose stools, please try cutting bag the magnesium oxide to once daily and see if you have any improvement. We will bring you back in 1-2 months to meet your new primary care doctor, Dr. Sharon Seller.  Please do not hesitate to call if you have any questions or concerns in the mean time.

## 2018-04-23 ENCOUNTER — Encounter: Payer: Self-pay | Admitting: Internal Medicine

## 2018-04-23 NOTE — Assessment & Plan Note (Signed)
Patient notes improvement since decreasing frequency of doubled dose to every other day, but still having loose stools. History reveals no other associated symptoms or evident etiology for her symptoms. Her labs are closely monitored by nephrologist, and she states everything was normal as of 2 weeks ago. Will try decreasing dose of magnesium oxide to original dosing of 400 mg daily and see if symptoms improve. She follows up with nephrology at the end of this month and will continue to monitor magnesium, along with other electrolytes and renal function.  Follow-up in 1-2 months. If diarrhea continues, will need to work-up for other etiologies.

## 2018-04-28 NOTE — Progress Notes (Signed)
Internal Medicine Clinic Attending  I saw and evaluated the patient.  I personally confirmed the key portions of the history and exam documented by Dr. Koleen Distance and I reviewed pertinent patient test results.  The assessment, diagnosis, and plan were formulated together and I agree with the documentation in the resident's note.  Lenice Pressman, M.D., Ph.D.

## 2018-04-29 ENCOUNTER — Ambulatory Visit: Payer: Medicare Other | Admitting: Pharmacist

## 2018-04-29 ENCOUNTER — Ambulatory Visit (INDEPENDENT_AMBULATORY_CARE_PROVIDER_SITE_OTHER): Payer: Medicare Other | Admitting: Internal Medicine

## 2018-04-29 VITALS — BP 109/63 | HR 65 | Temp 97.8°F | Wt 186.2 lb

## 2018-04-29 DIAGNOSIS — E138 Other specified diabetes mellitus with unspecified complications: Secondary | ICD-10-CM | POA: Diagnosis not present

## 2018-04-29 DIAGNOSIS — F17211 Nicotine dependence, cigarettes, in remission: Secondary | ICD-10-CM | POA: Diagnosis not present

## 2018-04-29 DIAGNOSIS — Z940 Kidney transplant status: Secondary | ICD-10-CM

## 2018-04-29 DIAGNOSIS — E119 Type 2 diabetes mellitus without complications: Secondary | ICD-10-CM

## 2018-04-29 DIAGNOSIS — Z794 Long term (current) use of insulin: Secondary | ICD-10-CM | POA: Diagnosis not present

## 2018-04-29 NOTE — Progress Notes (Signed)
   CC: CGM #2 download  HPI:  [name] is a 52 y.o. with PMH as below presenting today for her second CGM download.   Please see A&P for assessment of the patient's chronic medical conditions.   Past Medical History:  Diagnosis Date  . Blood transfusion without reported diagnosis   . 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 GERD (gastroesophageal reflux disease)    medication induced  . Hypertension   . 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 Negative for diaphoresis.  Genitourinary: Negative for dysuria and urgency.  Neurological: Positive for weakness. Negative for dizziness, tingling and headaches.   Physical Exam:  Constitution: NAD, well-developed, well nourished Respiratory: non-labored breathing Neuro: sensation intact, a&o, normal affect Skin: c/d/i    Vitals:   04/29/18 0955  BP: 109/63  Pulse: 65  Temp: 97.8 F (36.6 C)  TempSrc: Oral  SpO2: 99%  Weight: 186 lb 3.2 oz (84.5 kg)    Assessment & Plan:   See Encounters Tab for problem based charting.  Patient seen with Dr. Lynnae January

## 2018-04-29 NOTE — Progress Notes (Addendum)
S: [name] is a 52 y.o. female reports to clinical pharmacist appointment for CGM download #1.   Allergies  Allergen Reactions  . Infed [Iron Dextran] Other (See Comments)    Pt has chest pain  Chest pain  . Cefazolin Itching and Nausea And Vomiting  . Ancef [Cefazolin Sodium] Other (See Comments)    Tolerated rocephin 11/03/10 Reports ancef causes itching, sweating, vomiting (x2-2009?)  . Lisinopril Swelling  . Other     Pt may be allergic to another antibiotic daughter isn't sure and pt unable to verify.   Medication Sig  amLODipine (NORVASC) 10 MG tablet Take 10 mg by mouth daily.  atorvastatin (LIPITOR) 10 MG tablet Take 10 mg by mouth.  Calcium-Phosphorus-Vitamin D (CITRACAL +D3 PO) Take 1 tablet by mouth. Mon, Wed, Friday nights only  cloNIDine (CATAPRES) 0.2 MG tablet Take 0.2 mg by mouth 3 (three) times daily.  famotidine (PEPCID) 20 MG tablet 40 mg daily with breakfast.  insulin aspart (NOVOLOG) 100 UNIT/ML injection Inject into the skin.  insulin glargine (LANTUS) 100 UNIT/ML injection Inject 30 Units at bedtime into the skin.  iron polysaccharides (NIFEREX) 150 MG capsule Take 150 mg by mouth.  labetalol (NORMODYNE) 300 MG tablet Take 300 mg by mouth 2 (two) times daily.   losartan (COZAAR) 50 MG tablet TAKE 2 TABLETS(100 MG) BY MOUTH DAILY  magnesium oxide (MAG-OX) 400 MG tablet Take 400 mg by mouth 2 (two) times daily.  mycophenolate (MYFORTIC) 180 MG EC tablet Take 720 mg by mouth.  PROGRAF 1 MG capsule TAKE 4 CAPSULES BY MOUTH TWO TIMES DAILY   Past Medical History:  Diagnosis Date  . Blood transfusion without reported diagnosis   . 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 GERD (gastroesophageal reflux disease)    medication induced  . Hypertension   . 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 Marital status: Single    Spouse name: Not on file  . Number of children: Not on file  . Years of education: 30  . Highest education level: Not on file  Occupational History    Employer: UNEMPLOYED  Social Needs  . Financial resource strain: Not on file  . Food insecurity:    Worry: Not on file    Inability: Not on file  . Transportation needs:    Medical: Not on file    Non-medical: Not on file  Tobacco Use  . Smoking status: Former Smoker    Packs/day: 0.50    Types: Cigarettes    Last attempt to quit: 11/05/2017    Years since quitting: 0.4  . Smokeless tobacco: Never Used  Substance and Sexual Activity  . Alcohol use: No    Alcohol/week: 0.0 standard drinks  . Drug use: No  . Sexual activity: Never  Lifestyle  . Physical activity:    Days per week: Not on file    Minutes per session: Not on file  . Stress: Not on file  Relationships  . Social connections:    Talks on phone: Not on file    Gets together: Not on file    Attends religious service: Not on file  Active member of club or organization: Not on file    Attends meetings of clubs or organizations: Not on file    Relationship status: Not on file  Other Topics Concern  . 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 Hypertension Mother   . Diabetes Mother   . Atrial fibrillation Father   . Hypertension Father   . Arthritis Father   . Heart failure Sister   . Hypertension Sister        all 4 sisters  . Hypertension Brother         both brothers  . Diabetes Brother   . Colon cancer Neg Hx   . Stomach cancer Neg Hx     O: Ht Readings from Last 2 Encounters:  04/22/18 6' (1.829 m)  05/21/17 6' (1.829 m)   Wt Readings from Last 2 Encounters:  04/29/18 186 lb 3.2 oz (84.5 kg)  04/22/18 187 lb 4.8 oz (85 kg)   There is no height or weight on file to calculate BMI. BP Readings from Last 3 Encounters:  04/29/18 109/63  04/22/18 (!) 146/79  02/04/18 125/67     A/P: The data from Ms. Soderberg's CGM was downloaded today. Only 5 days of data were collected, so the CGM was removed at this visit.   Above 180 mg/dL 0% of the time In tagret range (70-180 mg/dL) 95% of the time Below 70 mg/dL 5% of the time  Average BG 82 mg/dL   Patient reports current diabetes regimen as follows: Novolog sliding scale before meals, only has used this 2 times this week Lantus 22-24 units at bedtime if blood sugar is > 97 mg/dL, has used this 2-3 times this week.  Multiple days this week with no insulin injections.   Patient reports feeling tired when she gets hypoglycemic, but not shaky or sweaty. She reports feeling tired more often lately than usual. She was feeling tired during the visit, but when her BG was taken it was 154 mg/dL.   Recommendations: I recommend that we consult with Ms. Ziegler's endocrinologist, Dr. Buddy Duty. Due to her hypoglycemic episodes shown on the CGM , it would be appropriate to stop her mealtime sliding scale insulin and decrease her basal insulin.   This was a co-visit; see Dr. Aurelio Jew note for further documentation.    20 minutes spent face-to-face with the patient during the encounter. 50% of time spent on education. 50% of time was spent on gathering an accurate medication history for her diabetes regimen.   Kathyrn Sheriff PharmD Student   Patient was seen in clinic with Kathyrn Sheriff, PharmD Candidate. I agree with the assessment and plan of care documented.

## 2018-04-29 NOTE — Patient Instructions (Addendum)
Thank you for allowing [place] to provide your care today. Today we discussed your type II diabetes.   Today we made no changes to your medications. I will discuss your insulin use and lower blood sugars with Dr. Buddy Duty. In the meantime, please continue to take your insulin sliding scale, and I will call you in the next few days.   Please follow-up in one month once we make changes to your medications.     Should you have any questions or concerns please call the internal medicine clinic at [phone].

## 2018-04-30 NOTE — Assessment & Plan Note (Addendum)
[name] wore the CGM for 5 days. The average reading was 82, % time in target was 95, % time below target was 5, and % time above target was. 0. During her first week of CGM her monitor fell off. This past week her monitor stopped working after five days. We will hold off on continuing with her monitor, but she is having some low glucose readings. She has been using insulin since her TIIDM diagnosis in 2013. She does not use her insulin every night as her glucose is often low. She does see Dr. Buddy Duty with Endocrinology, and I believe they have been managing her insulin regimen. She states she has had some symptoms of hypoglycemia, mainly that she just feels very tired when her glucose is lower than usual.   - will contact Dr. Buddy Duty to discuss possibly switching to another medication - cont. Current management at this time, encouraged her to continue checking blood glucose and holding insulin as she has been  ADDENDUM: Spoke with Dr. 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 He did agree that she needed decrease in insulin therapy and possibly could be switched from insulin. Post-prandial hyperglycemia is often the bigger issue with these patients and thus her Lantus, which she is only taking around 3 times per week, could potentially be stopped. He also recommended prandin (repaglinide).   - hold Lantus  - cont. SSI novolog for now and will consider switching to prandin in the future  - f/u two weeks for glucometer check

## 2018-05-01 NOTE — Progress Notes (Signed)
Internal Medicine Clinic Attending  I saw and evaluated the patient.  I personally confirmed the key portions of the history and exam documented by Dr. Sharon Seller and I reviewed pertinent patient test results.  The assessment, diagnosis, and plan were formulated together and I agree with the documentation in the resident's note.   I personally reviewed the data & the resident's interpretation and agree.   Per Dr Aurelio Jew conversation with Dr Buddy Duty : I spoke with Dr. Buddy Duty about [name], who has kidney transplant and we thought Type II DM. According to Dr. 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  He did agree that she needed a medication change and made some recommendations. He did say for transplant diabetics their post-prandial insulin is usually the bigger issue. He also stated Prandin (repaglinide) may also be a good choice for her. My plan is to continue with the SSI novolog currently, hold the Lantus (only taking her Lantus several times a week,), and then discuss switching to Prandin after seeing how she does.

## 2018-05-07 DIAGNOSIS — Z23 Encounter for immunization: Secondary | ICD-10-CM | POA: Diagnosis not present

## 2018-05-07 DIAGNOSIS — Z79899 Other long term (current) drug therapy: Secondary | ICD-10-CM | POA: Diagnosis not present

## 2018-05-07 DIAGNOSIS — E785 Hyperlipidemia, unspecified: Secondary | ICD-10-CM | POA: Diagnosis not present

## 2018-05-07 DIAGNOSIS — G4733 Obstructive sleep apnea (adult) (pediatric): Secondary | ICD-10-CM | POA: Diagnosis not present

## 2018-05-07 DIAGNOSIS — Z940 Kidney transplant status: Secondary | ICD-10-CM | POA: Diagnosis not present

## 2018-05-07 DIAGNOSIS — I129 Hypertensive chronic kidney disease with stage 1 through stage 4 chronic kidney disease, or unspecified chronic kidney disease: Secondary | ICD-10-CM | POA: Diagnosis not present

## 2018-05-07 DIAGNOSIS — N2581 Secondary hyperparathyroidism of renal origin: Secondary | ICD-10-CM | POA: Diagnosis not present

## 2018-05-07 DIAGNOSIS — T829XXA Unspecified complication of cardiac and vascular prosthetic device, implant and graft, initial encounter: Secondary | ICD-10-CM | POA: Diagnosis not present

## 2018-05-07 DIAGNOSIS — E118 Type 2 diabetes mellitus with unspecified complications: Secondary | ICD-10-CM | POA: Diagnosis not present

## 2018-05-07 DIAGNOSIS — R809 Proteinuria, unspecified: Secondary | ICD-10-CM | POA: Diagnosis not present

## 2018-05-07 DIAGNOSIS — N183 Chronic kidney disease, stage 3 (moderate): Secondary | ICD-10-CM | POA: Diagnosis not present

## 2018-05-13 ENCOUNTER — Ambulatory Visit (INDEPENDENT_AMBULATORY_CARE_PROVIDER_SITE_OTHER): Payer: Medicare Other | Admitting: Internal Medicine

## 2018-05-13 ENCOUNTER — Ambulatory Visit: Payer: Medicare Other | Admitting: Pharmacist

## 2018-05-13 ENCOUNTER — Encounter: Payer: Self-pay | Admitting: Internal Medicine

## 2018-05-13 VITALS — BP 139/73 | HR 73 | Temp 98.8°F | Wt 185.3 lb

## 2018-05-13 DIAGNOSIS — E138 Other specified diabetes mellitus with unspecified complications: Secondary | ICD-10-CM

## 2018-05-13 DIAGNOSIS — E119 Type 2 diabetes mellitus without complications: Secondary | ICD-10-CM

## 2018-05-13 DIAGNOSIS — F17211 Nicotine dependence, cigarettes, in remission: Secondary | ICD-10-CM | POA: Diagnosis not present

## 2018-05-13 DIAGNOSIS — Z794 Long term (current) use of insulin: Secondary | ICD-10-CM

## 2018-05-13 DIAGNOSIS — Z9483 Pancreas transplant status: Secondary | ICD-10-CM

## 2018-05-13 NOTE — Patient Instructions (Addendum)
Thank you for coming to the clinic today. It was a pleasure to see you.   For your diabetes, please continue using the novolog sliding scale and continue to hold off on the lantus   FOLLOW-UP INSTRUCTIONS When: 3-6 month with Dr. Sharon Seller  For: follow up of your general health  What to bring: all of your medication bottles and blood glucose meter   Please call the internal medicine center clinic if you have any questions or concerns, we may be able to help and keep you from a long and expensive emergency room wait. Our clinic and after hours phone number is [phone], the best time to call is Monday through Friday 9 am to 4 pm but there is always someone available 24/7 if you have an emergency. If you need medication refills please notify your pharmacy one week in advance and they will send [place] a request.

## 2018-05-13 NOTE — Progress Notes (Signed)
   CC: follow up of diabetes   HPI:  [name] is a 52 y.o. with PMH as listed below who presents for follow up of diabetes. Please see the assessment and plans for the status of the patient chronic medical problems.   Past Medical History:  Diagnosis Date  . Blood transfusion without reported diagnosis   . 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 GERD (gastroesophageal reflux disease)    medication induced  . Hypertension   . 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   [facility]; donor was a 54 yo CMV positive person with elevated PRA at 65%; she developed de novo donor specific AB post transplant & was treated wit hplasmaperesis & IVIG & rituximab; As of 05/2012, baselin Cr 1.4-1.6   Review of Systems:  Refer to history of present illness and assessment and plans for pertinent review of systems, all others reviewed and negative  Physical Exam:  Vitals:   05/13/18 0940  BP: 139/73  Pulse: 73  Temp: 98.8 F (37.1 C)  TempSrc: Oral  SpO2: 100%  Weight: 185 lb 4.8 oz (84.1 kg)   General: well appearing, no acute distress  Cardiac: regular rate and rhythm, no peripheral edema   Assessment & Plan:   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 Buddy Duty. Recently a CGM was placed in our office to evaluate for asymptomatic hypoglycemia. Consistent hypoglycemia at night was revealed, this was discussed with her endocrinologist who explained that she has transplant induced disease and post prandial hyperglycemia would be her biggest issue so she was told to stop taking long acting insulin and continue her sliding scale. She has not noticed any changes in how she feels throughout the day or changes on her blood glucose meter since stopping the lantus. She did not know that she would need the blood glucose meter today.  - continue sliding scale - continue Management with Dr.  Buddy Duty  See Encounters Tab for problem based charting.  Patient discussed with Dr. Angelia Mould

## 2018-05-14 NOTE — Assessment & Plan Note (Signed)
[name] diabetes is managed by Dr. Buddy Duty. Recently a CGM was placed in our office to evaluate for asymptomatic hypoglycemia. Consistent hypoglycemia at night was revealed, this was discussed with her endocrinologist who explained that she has transplant induced disease and post prandial hyperglycemia would be her biggest issue so she was told to stop taking long acting insulin and continue her sliding scale. She has not noticed any changes in how she feels throughout the day or changes on her blood glucose meter since stopping the lantus.  - continue sliding scale - continue Management with Dr. Buddy Duty

## 2018-05-18 LAB — HEMOGLOBIN A1C: A1c: 5

## 2018-05-18 NOTE — Progress Notes (Signed)
Internal Medicine Clinic Attending  Case discussed with Dr. Blum at the time of the visit.  We reviewed the resident's history and exam and pertinent patient test results.  I agree with the assessment, diagnosis, and plan of care documented in the resident's note. 

## 2018-05-22 DIAGNOSIS — D8989 Other specified disorders involving the immune mechanism, not elsewhere classified: Secondary | ICD-10-CM | POA: Diagnosis not present

## 2018-05-22 DIAGNOSIS — Z79899 Other long term (current) drug therapy: Secondary | ICD-10-CM | POA: Diagnosis not present

## 2018-05-22 DIAGNOSIS — E669 Obesity, unspecified: Secondary | ICD-10-CM | POA: Diagnosis not present

## 2018-05-22 DIAGNOSIS — Z5181 Encounter for therapeutic drug level monitoring: Secondary | ICD-10-CM | POA: Diagnosis not present

## 2018-05-22 DIAGNOSIS — I10 Essential (primary) hypertension: Secondary | ICD-10-CM | POA: Diagnosis not present

## 2018-05-22 DIAGNOSIS — D649 Anemia, unspecified: Secondary | ICD-10-CM | POA: Diagnosis not present

## 2018-05-22 DIAGNOSIS — F17210 Nicotine dependence, cigarettes, uncomplicated: Secondary | ICD-10-CM | POA: Diagnosis not present

## 2018-05-22 DIAGNOSIS — Z79621 Long term (current) use of calcineurin inhibitor: Secondary | ICD-10-CM | POA: Insufficient documentation

## 2018-05-22 DIAGNOSIS — Z794 Long term (current) use of insulin: Secondary | ICD-10-CM | POA: Diagnosis not present

## 2018-05-22 DIAGNOSIS — Z4822 Encounter for aftercare following kidney transplant: Secondary | ICD-10-CM | POA: Diagnosis not present

## 2018-05-22 DIAGNOSIS — R809 Proteinuria, unspecified: Secondary | ICD-10-CM | POA: Diagnosis not present

## 2018-05-22 DIAGNOSIS — Z7952 Long term (current) use of systemic steroids: Secondary | ICD-10-CM | POA: Diagnosis not present

## 2018-05-22 DIAGNOSIS — Z940 Kidney transplant status: Secondary | ICD-10-CM | POA: Diagnosis not present

## 2018-05-22 DIAGNOSIS — E119 Type 2 diabetes mellitus without complications: Secondary | ICD-10-CM | POA: Diagnosis not present

## 2018-05-22 DIAGNOSIS — Z6824 Body mass index (BMI) 24.0-24.9, adult: Secondary | ICD-10-CM | POA: Diagnosis not present

## 2018-05-22 DIAGNOSIS — E663 Overweight: Secondary | ICD-10-CM | POA: Diagnosis not present

## 2018-05-22 DIAGNOSIS — E785 Hyperlipidemia, unspecified: Secondary | ICD-10-CM | POA: Diagnosis not present

## 2018-06-01 ENCOUNTER — Encounter (HOSPITAL_COMMUNITY): Payer: Self-pay | Admitting: Emergency Medicine

## 2018-06-01 ENCOUNTER — Other Ambulatory Visit: Payer: Self-pay

## 2018-06-01 ENCOUNTER — Emergency Department (HOSPITAL_COMMUNITY): Payer: Medicare Other

## 2018-06-01 ENCOUNTER — Inpatient Hospital Stay (HOSPITAL_COMMUNITY): Payer: Medicare Other

## 2018-06-01 ENCOUNTER — Inpatient Hospital Stay (HOSPITAL_COMMUNITY)
Admission: EM | Admit: 2018-06-01 | Discharge: 2018-06-04 | DRG: 204 | Disposition: A | Discharge status: Home / Self Care | Payer: Medicare Other | Attending: Internal Medicine | Admitting: Internal Medicine

## 2018-06-01 DIAGNOSIS — Z794 Long term (current) use of insulin: Secondary | ICD-10-CM

## 2018-06-01 DIAGNOSIS — I440 Atrioventricular block, first degree: Secondary | ICD-10-CM | POA: Diagnosis not present

## 2018-06-01 DIAGNOSIS — N189 Chronic kidney disease, unspecified: Secondary | ICD-10-CM | POA: Diagnosis present

## 2018-06-01 DIAGNOSIS — Z7952 Long term (current) use of systemic steroids: Secondary | ICD-10-CM

## 2018-06-01 DIAGNOSIS — R0602 Shortness of breath: Secondary | ICD-10-CM | POA: Diagnosis not present

## 2018-06-01 DIAGNOSIS — E8809 Other disorders of plasma-protein metabolism, not elsewhere classified: Secondary | ICD-10-CM | POA: Diagnosis present

## 2018-06-01 DIAGNOSIS — E876 Hypokalemia: Secondary | ICD-10-CM | POA: Diagnosis not present

## 2018-06-01 DIAGNOSIS — R748 Abnormal levels of other serum enzymes: Secondary | ICD-10-CM | POA: Diagnosis not present

## 2018-06-01 DIAGNOSIS — Z720 Tobacco use: Secondary | ICD-10-CM

## 2018-06-01 DIAGNOSIS — R0781 Pleurodynia: Secondary | ICD-10-CM

## 2018-06-01 DIAGNOSIS — T451X5A Adverse effect of antineoplastic and immunosuppressive drugs, initial encounter: Secondary | ICD-10-CM | POA: Diagnosis present

## 2018-06-01 DIAGNOSIS — N2581 Secondary hyperparathyroidism of renal origin: Secondary | ICD-10-CM | POA: Diagnosis present

## 2018-06-01 DIAGNOSIS — E8889 Other specified metabolic disorders: Secondary | ICD-10-CM | POA: Diagnosis present

## 2018-06-01 DIAGNOSIS — R079 Chest pain, unspecified: Secondary | ICD-10-CM

## 2018-06-01 DIAGNOSIS — I129 Hypertensive chronic kidney disease with stage 1 through stage 4 chronic kidney disease, or unspecified chronic kidney disease: Secondary | ICD-10-CM | POA: Diagnosis present

## 2018-06-01 DIAGNOSIS — E1122 Type 2 diabetes mellitus with diabetic chronic kidney disease: Secondary | ICD-10-CM | POA: Diagnosis present

## 2018-06-01 DIAGNOSIS — R918 Other nonspecific abnormal finding of lung field: Secondary | ICD-10-CM | POA: Diagnosis not present

## 2018-06-01 DIAGNOSIS — I447 Left bundle-branch block, unspecified: Secondary | ICD-10-CM

## 2018-06-01 DIAGNOSIS — R109 Unspecified abdominal pain: Secondary | ICD-10-CM | POA: Diagnosis not present

## 2018-06-01 DIAGNOSIS — I959 Hypotension, unspecified: Secondary | ICD-10-CM | POA: Diagnosis present

## 2018-06-01 DIAGNOSIS — Z940 Kidney transplant status: Secondary | ICD-10-CM

## 2018-06-01 DIAGNOSIS — J90 Pleural effusion, not elsewhere classified: Secondary | ICD-10-CM | POA: Diagnosis not present

## 2018-06-01 DIAGNOSIS — R899 Unspecified abnormal finding in specimens from other organs, systems and tissues: Secondary | ICD-10-CM

## 2018-06-01 DIAGNOSIS — N250 Renal osteodystrophy: Secondary | ICD-10-CM | POA: Diagnosis present

## 2018-06-01 DIAGNOSIS — Z833 Family history of diabetes mellitus: Secondary | ICD-10-CM | POA: Diagnosis not present

## 2018-06-01 DIAGNOSIS — Z6825 Body mass index (BMI) 25.0-25.9, adult: Secondary | ICD-10-CM

## 2018-06-01 DIAGNOSIS — D72819 Decreased white blood cell count, unspecified: Secondary | ICD-10-CM | POA: Diagnosis present

## 2018-06-01 DIAGNOSIS — R634 Abnormal weight loss: Secondary | ICD-10-CM | POA: Diagnosis present

## 2018-06-01 DIAGNOSIS — Z79899 Other long term (current) drug therapy: Secondary | ICD-10-CM | POA: Diagnosis not present

## 2018-06-01 DIAGNOSIS — Z8261 Family history of arthritis: Secondary | ICD-10-CM | POA: Diagnosis not present

## 2018-06-01 DIAGNOSIS — K529 Noninfective gastroenteritis and colitis, unspecified: Secondary | ICD-10-CM | POA: Diagnosis present

## 2018-06-01 DIAGNOSIS — R778 Other specified abnormalities of plasma proteins: Secondary | ICD-10-CM

## 2018-06-01 DIAGNOSIS — R001 Bradycardia, unspecified: Secondary | ICD-10-CM | POA: Diagnosis present

## 2018-06-01 DIAGNOSIS — Z888 Allergy status to other drugs, medicaments and biological substances status: Secondary | ICD-10-CM

## 2018-06-01 DIAGNOSIS — E119 Type 2 diabetes mellitus without complications: Secondary | ICD-10-CM | POA: Diagnosis not present

## 2018-06-01 DIAGNOSIS — N179 Acute kidney failure, unspecified: Secondary | ICD-10-CM | POA: Diagnosis present

## 2018-06-01 DIAGNOSIS — E872 Acidosis: Secondary | ICD-10-CM | POA: Diagnosis present

## 2018-06-01 DIAGNOSIS — I10 Essential (primary) hypertension: Secondary | ICD-10-CM

## 2018-06-01 DIAGNOSIS — Z87891 Personal history of nicotine dependence: Secondary | ICD-10-CM

## 2018-06-01 DIAGNOSIS — R0789 Other chest pain: Secondary | ICD-10-CM | POA: Diagnosis present

## 2018-06-01 DIAGNOSIS — R7989 Other specified abnormal findings of blood chemistry: Secondary | ICD-10-CM

## 2018-06-01 DIAGNOSIS — Z881 Allergy status to other antibiotic agents status: Secondary | ICD-10-CM | POA: Diagnosis not present

## 2018-06-01 DIAGNOSIS — Z8249 Family history of ischemic heart disease and other diseases of the circulatory system: Secondary | ICD-10-CM

## 2018-06-01 LAB — URINALYSIS, ROUTINE W REFLEX MICROSCOPIC
BILIRUBIN URINE: NEGATIVE
Bacteria, UA: NONE SEEN
GLUCOSE, UA: NEGATIVE mg/dL
Hgb urine dipstick: NEGATIVE
KETONES UR: NEGATIVE mg/dL
LEUKOCYTES UA: NEGATIVE
Nitrite: NEGATIVE
PH: 5 (ref 5.0–8.0)
PROTEIN: 30 mg/dL — AB
Specific Gravity, Urine: 1.012 (ref 1.005–1.030)

## 2018-06-01 LAB — BASIC METABOLIC PANEL
Anion gap: 8 (ref 5–15)
BUN: 22 mg/dL — AB (ref 6–20)
CO2: 14 mmol/L — ABNORMAL LOW (ref 22–32)
Calcium: 7 mg/dL — ABNORMAL LOW (ref 8.9–10.3)
Chloride: 120 mmol/L — ABNORMAL HIGH (ref 98–111)
Creatinine, Ser: 2.03 mg/dL — ABNORMAL HIGH (ref 0.44–1.00)
GFR, EST AFRICAN AMERICAN: 31 mL/min — AB (ref >60)
GFR, EST NON AFRICAN AMERICAN: 27 mL/min — AB (ref >60)
GLUCOSE: 103 mg/dL — AB (ref 70–99)
POTASSIUM: 2.2 mmol/L — AB (ref 3.5–5.1)
Sodium: 142 mmol/L (ref 135–145)

## 2018-06-01 LAB — COMPREHENSIVE METABOLIC PANEL
ALT: 16 U/L (ref 0–44)
AST: 15 U/L (ref 15–41)
Albumin: 3.9 g/dL (ref 3.5–5.0)
Alkaline Phosphatase: 479 U/L — ABNORMAL HIGH (ref 38–126)
Anion gap: 8 (ref 5–15)
BUN: 22 mg/dL — AB (ref 6–20)
CHLORIDE: 121 mmol/L — AB (ref 98–111)
CO2: 15 mmol/L — AB (ref 22–32)
Calcium: 7.2 mg/dL — ABNORMAL LOW (ref 8.9–10.3)
Creatinine, Ser: 2.26 mg/dL — ABNORMAL HIGH (ref 0.44–1.00)
GFR, EST AFRICAN AMERICAN: 27 mL/min — AB (ref >60)
GFR, EST NON AFRICAN AMERICAN: 23 mL/min — AB (ref >60)
Glucose, Bld: 96 mg/dL (ref 70–99)
POTASSIUM: 2.3 mmol/L — AB (ref 3.5–5.1)
Sodium: 144 mmol/L (ref 135–145)
Total Bilirubin: 0.6 mg/dL (ref 0.3–1.2)
Total Protein: 6.3 g/dL — ABNORMAL LOW (ref 6.5–8.1)

## 2018-06-01 LAB — CBC
HCT: 31.8 % — ABNORMAL LOW (ref 36.0–46.0)
Hemoglobin: 10.5 g/dL — ABNORMAL LOW (ref 12.0–15.0)
MCH: 32.3 pg (ref 26.0–34.0)
MCHC: 33 g/dL (ref 30.0–36.0)
MCV: 97.8 fL (ref 80.0–100.0)
NRBC: 0 % (ref 0.0–0.2)
PLATELETS: 209 10*3/uL (ref 150–400)
RBC: 3.25 MIL/uL — AB (ref 3.87–5.11)
RDW: 17.4 % — AB (ref 11.5–15.5)
WBC: 4.9 10*3/uL (ref 4.0–10.5)

## 2018-06-01 LAB — TROPONIN I
TROPONIN I: 0.05 ng/mL — AB (ref <0.03)
Troponin I: 0.04 ng/mL (ref <0.03)
Troponin I: 0.04 ng/mL (ref <0.03)

## 2018-06-01 LAB — GLUCOSE, CAPILLARY: Glucose-Capillary: 112 mg/dL — ABNORMAL HIGH (ref 70–99)

## 2018-06-01 LAB — LIPASE, BLOOD: LIPASE: 45 U/L (ref 11–51)

## 2018-06-01 LAB — MAGNESIUM: Magnesium: 1.2 mg/dL — ABNORMAL LOW (ref 1.7–2.4)

## 2018-06-01 MED ORDER — AMLODIPINE BESYLATE 10 MG PO TABS
10.0000 mg | ORAL_TABLET | Freq: Every day | ORAL | Status: DC
  Administered 2018-06-02 – 2018-06-03 (×2): 10 mg via ORAL
  Filled 2018-06-01: qty 1
  Filled 2018-06-01: qty 2
  Filled 2018-06-01: qty 1

## 2018-06-01 MED ORDER — POTASSIUM CHLORIDE CRYS ER 20 MEQ PO TBCR
40.0000 meq | EXTENDED_RELEASE_TABLET | Freq: Once | ORAL | Status: AC
  Administered 2018-06-01: 40 meq via ORAL
  Filled 2018-06-01: qty 2

## 2018-06-01 MED ORDER — ATORVASTATIN CALCIUM 10 MG PO TABS
10.0000 mg | ORAL_TABLET | ORAL | Status: DC
  Administered 2018-06-03: 10 mg via ORAL
  Filled 2018-06-01: qty 1

## 2018-06-01 MED ORDER — CALCITRIOL 0.25 MCG PO CAPS
0.2500 ug | ORAL_CAPSULE | ORAL | Status: DC
  Administered 2018-06-03: 0.25 ug via ORAL
  Filled 2018-06-01: qty 1

## 2018-06-01 MED ORDER — ENOXAPARIN SODIUM 30 MG/0.3ML ~~LOC~~ SOLN
30.0000 mg | SUBCUTANEOUS | Status: DC
  Administered 2018-06-01: 30 mg via SUBCUTANEOUS
  Filled 2018-06-01: qty 0.3

## 2018-06-01 MED ORDER — INSULIN ASPART 100 UNIT/ML ~~LOC~~ SOLN: 0.0000 [IU] | Freq: Every day | SUBCUTANEOUS | Status: DC

## 2018-06-01 MED ORDER — POTASSIUM CHLORIDE 10 MEQ/100ML IV SOLN
10.0000 meq | INTRAVENOUS | Status: AC
  Administered 2018-06-01 – 2018-06-02 (×6): 10 meq via INTRAVENOUS
  Filled 2018-06-01 (×6): qty 100

## 2018-06-01 MED ORDER — TECHNETIUM TO 99M ALBUMIN AGGREGATED: 4.3000 | Freq: Once | INTRAVENOUS | Status: DC | PRN

## 2018-06-01 MED ORDER — FAMOTIDINE 20 MG PO TABS
20.0000 mg | ORAL_TABLET | Freq: Every day | ORAL | Status: DC
  Administered 2018-06-02 – 2018-06-04 (×3): 20 mg via ORAL
  Filled 2018-06-01 (×3): qty 1

## 2018-06-01 MED ORDER — CLONIDINE HCL 0.2 MG PO TABS
0.2000 mg | ORAL_TABLET | Freq: Three times a day (TID) | ORAL | Status: DC
  Administered 2018-06-01 – 2018-06-03 (×4): 0.2 mg via ORAL
  Filled 2018-06-01 (×5): qty 1

## 2018-06-01 MED ORDER — TACROLIMUS 1 MG PO CAPS
3.0000 mg | ORAL_CAPSULE | Freq: Two times a day (BID) | ORAL | Status: DC
  Administered 2018-06-01 – 2018-06-04 (×6): 3 mg via ORAL
  Filled 2018-06-01 (×6): qty 3

## 2018-06-01 MED ORDER — HEPARIN (PORCINE) 25000 UT/250ML-% IV SOLN
1350.0000 [IU]/h | INTRAVENOUS | Status: DC
  Administered 2018-06-01: 1350 [IU]/h via INTRAVENOUS
  Filled 2018-06-01: qty 250

## 2018-06-01 MED ORDER — MORPHINE SULFATE (PF) 4 MG/ML IV SOLN
4.0000 mg | Freq: Once | INTRAVENOUS | Status: AC
  Administered 2018-06-01: 4 mg via INTRAVENOUS
  Filled 2018-06-01: qty 1

## 2018-06-01 MED ORDER — HEPARIN BOLUS VIA INFUSION
5000.0000 [IU] | Freq: Once | INTRAVENOUS | Status: AC
  Administered 2018-06-01: 5000 [IU] via INTRAVENOUS
  Filled 2018-06-01: qty 5000

## 2018-06-01 MED ORDER — ACETAMINOPHEN 325 MG PO TABS
650.0000 mg | ORAL_TABLET | Freq: Four times a day (QID) | ORAL | Status: DC | PRN
  Administered 2018-06-02: 650 mg via ORAL
  Filled 2018-06-01: qty 2

## 2018-06-01 MED ORDER — POTASSIUM CHLORIDE 10 MEQ/100ML IV SOLN
10.0000 meq | INTRAVENOUS | Status: AC
  Administered 2018-06-01 (×2): 10 meq via INTRAVENOUS
  Filled 2018-06-01: qty 100

## 2018-06-01 MED ORDER — ACETAMINOPHEN 650 MG RE SUPP: 650.0000 mg | Freq: Four times a day (QID) | RECTAL | Status: DC | PRN

## 2018-06-01 MED ORDER — INSULIN ASPART 100 UNIT/ML ~~LOC~~ SOLN
0.0000 [IU] | Freq: Three times a day (TID) | SUBCUTANEOUS | Status: DC
  Administered 2018-06-02 – 2018-06-03 (×2): 1 [IU] via SUBCUTANEOUS

## 2018-06-01 MED ORDER — MYCOPHENOLATE SODIUM 180 MG PO TBEC
720.0000 mg | DELAYED_RELEASE_TABLET | Freq: Two times a day (BID) | ORAL | Status: DC
  Administered 2018-06-01 – 2018-06-03 (×4): 720 mg via ORAL
  Filled 2018-06-01 (×4): qty 4

## 2018-06-01 MED ORDER — PREDNISONE 5 MG PO TABS
2.5000 mg | ORAL_TABLET | Freq: Every day | ORAL | Status: DC
  Administered 2018-06-02 – 2018-06-04 (×3): 2.5 mg via ORAL
  Filled 2018-06-01 (×3): qty 1

## 2018-06-01 MED ORDER — TECHNETIUM TC 99M DIETHYLENETRIAME-PENTAACETIC ACID
31.6000 | Freq: Once | INTRAVENOUS | Status: AC | PRN
  Administered 2018-06-01: 31.6 via RESPIRATORY_TRACT

## 2018-06-01 MED ORDER — MAGNESIUM SULFATE 2 GM/50ML IV SOLN
2.0000 g | Freq: Once | INTRAVENOUS | Status: AC
  Administered 2018-06-01: 2 g via INTRAVENOUS
  Filled 2018-06-01: qty 50

## 2018-06-01 MED ORDER — SODIUM CHLORIDE 0.9% FLUSH
3.0000 mL | Freq: Two times a day (BID) | INTRAVENOUS | Status: DC
  Administered 2018-06-02 – 2018-06-04 (×5): 3 mL via INTRAVENOUS

## 2018-06-01 MED ORDER — LABETALOL HCL 300 MG PO TABS
300.0000 mg | ORAL_TABLET | Freq: Two times a day (BID) | ORAL | Status: DC
  Administered 2018-06-01 – 2018-06-02 (×2): 300 mg via ORAL
  Filled 2018-06-01 (×4): qty 1

## 2018-06-01 NOTE — ED Provider Notes (Addendum)
[facility] EMERGENCY DEPARTMENT Provider Note   CSN: [id number] Arrival date & time: 06/01/18  0846     History   Chief Complaint Chief Complaint  Patient presents with  . Chest Pain    HPI [name] is a 52 y.o. female.  HPI Patient is a 52 year old female presents the emergency department worsening right-sided chest pain and radiation into her right back and right side of her abdomen.  She reports shortness of breath with some mild cough.  No fevers or chills.  She is never had pain like this before.  It is worse when she takes a deep breath and worse on the right side of her chest.  No unilateral leg swelling.  No history of DVT or pulmonary embolism.  No recent lower extremity swelling.  Pain is moderate to severe in severity.   Past Medical History:  Diagnosis Date  . Blood transfusion without reported diagnosis   . 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 GERD (gastroesophageal reflux disease)    medication induced  . Hypertension   . 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 Diarrhea due to drug 02/04/2018  . Hyperlipidemia 02/12/2017  . Preventative health care 08/12/2012  . Tobacco abuse 04/30/2012  . S/p cadaver renal transplant 11/19/2011  . Type II diabetes mellitus, well controlled ([place]) 11/04/2011  . HTN (hypertension) 11/03/2011    Past Surgical History:  Procedure Laterality Date  . [place]  . CHOLECYSTECTOMY    . COLONOSCOPY  10 years ago   Dr.Hung="normal"  . DG AV DIALYSIS  SHUNT ACCESS EXIST*L* OR    . KIDNEY TRANSPLANT  2011  . UMBILICAL HERNIA REPAIR       OB  History   None      Home Medications    Prior to Admission medications   Medication Sig Start Date End Date Taking? Authorizing Provider  amLODipine (NORVASC) 10 MG tablet Take 10 mg by mouth daily.   Yes [provider]  atorvastatin (LIPITOR) 10 MG tablet Take 10 mg by mouth 3 (three) times a week. Taking on Monday, Wednesday, Friday.   Yes [provider]  calcitRIOL (ROCALTROL) 0.25 MCG capsule Take 0.25 mcg by mouth 3 (three) times a week. Monday, Wednesday, Friday. 04/14/18  Yes [provider]  Calcium-Phosphorus-Vitamin D (CITRACAL +D3 PO) Take 1 tablet by mouth. Mon, Wed, Friday nights only   Yes [provider]  cloNIDine (CATAPRES) 0.2 MG tablet Take 0.2 mg by mouth 3 (three) times daily. 02/02/17  Yes [provider]  famotidine (PEPCID) 20 MG tablet 40 mg daily with breakfast. 03/18/13  Yes [provider]  insulin aspart (NOVOLOG) 100 UNIT/ML injection Inject 8-14 Units into the skin as needed for high blood sugar.    Yes [provider]  labetalol (NORMODYNE) 300 MG tablet Take 300 mg by mouth 2 (two) times daily.    Yes [provider]  losartan (COZAAR) 50 MG tablet TAKE 2 TABLETS(100 MG) BY MOUTH DAILY Patient taking differently: Take 50 mg by mouth 2 (two) times daily.  10/06/17  Yes [place],  Alyson Locket, MD  magnesium oxide (MAG-OX) 400 MG tablet Take 400 mg by mouth 2 (two) times daily.   Yes [provider]  mycophenolate (MYFORTIC) 180 MG EC tablet Take 720 mg by mouth.   Yes [provider]  predniSONE (DELTASONE) 2.5 MG tablet Take 2.5 mg by mouth daily. 05/22/18  Yes [provider]  PROGRAF 1 MG capsule Take 3 mg by mouth 2 (two) times daily.  05/15/17  Yes [provider]    Family History Family History  Problem Relation Age of Onset  . Hypertension Mother   . Diabetes Mother   . Atrial fibrillation Father   . Hypertension Father   . Arthritis Father   .  Heart failure Sister   . Hypertension Sister        all 4 sisters  . Hypertension Brother        both brothers  . Diabetes Brother   . Colon cancer Neg Hx   . Stomach cancer Neg Hx     Social History Social History   Tobacco Use  . Smoking status: Former Smoker    Packs/day: 0.50    Types: Cigarettes    Last attempt to quit: 11/05/2017    Years since quitting: 0.5  . Smokeless tobacco: Never Used  Substance Use Topics  . Alcohol use: No    Alcohol/week: 0.0 standard drinks  . Drug use: No     Allergies   Infed [iron dextran]; Cefazolin; Ancef [cefazolin sodium]; Lisinopril; and Other   Review of Systems Review of Systems  All other systems reviewed and are negative.    Physical Exam Updated Vital Signs BP 134/89 (BP Location: Right Arm)   Pulse 67   Resp 14   LMP 11/05/2012   SpO2 100%   Physical Exam  Constitutional: She is oriented to person, place, and time. She appears well-developed and well-nourished. No distress.  HENT:  Head: Normocephalic and atraumatic.  Eyes: EOM are normal.  Neck: Normal range of motion.  Cardiovascular: Normal rate, regular rhythm and normal heart sounds.  Pulmonary/Chest: Effort normal and breath sounds normal.  Abdominal: Soft. She exhibits no distension. There is no tenderness.  Musculoskeletal: Normal range of motion.  Neurological: She is alert and oriented to person, place, and time.  Skin: Skin is warm and dry.  Psychiatric: She has a normal mood and affect. Judgment normal.  Nursing note and vitals reviewed.    ED Treatments / Results  Labs (all labs ordered are listed, but only abnormal results are displayed) Labs Reviewed  CBC - Abnormal; Notable for the following components:      Result Value   RBC 3.25 (*)    Hemoglobin 10.5 (*)    HCT 31.8 (*)    RDW 17.4 (*)    All other components within normal limits  COMPREHENSIVE METABOLIC PANEL - Abnormal; Notable for the following components:   Potassium 2.3 (*)     Chloride 121 (*)    CO2 15 (*)    BUN 22 (*)    Creatinine, Ser 2.26 (*)    Calcium 7.2 (*)    Total Protein 6.3 (*)    Alkaline Phosphatase 479 (*)    GFR calc non Af Amer 23 (*)    GFR calc Af Amer 27 (*)    All other components within normal limits  URINALYSIS, ROUTINE W REFLEX MICROSCOPIC - Abnormal; Notable for the following components:   Protein, ur 30 (*)    All other  components within normal limits  TROPONIN I - Abnormal; Notable for the following components:   Troponin I 0.04 (*)    All other components within normal limits  LIPASE, BLOOD  HEPARIN LEVEL (UNFRACTIONATED)  TROPONIN I    EKG ECG interpretation  Date: 06/01/2018  Rate: 65  Rhythm: normal sinus rhythm  QRS Axis: normal  Intervals: normal  ST/T Wave abnormalities: normal  Conduction Disutrbances:  LBBB  Narrative Interpretation:   Old EKG Reviewed: No significant changes noted     Radiology Ct Abdomen Pelvis Wo Contrast  Result Date: 06/01/2018 CLINICAL DATA:  RIGHT-sided chest pain radiating to back for the last 2 days with shortness of breath. Cough and diarrhea for a month, abdominal pain. EXAM: CT CHEST, ABDOMEN AND PELVIS WITHOUT CONTRAST TECHNIQUE: Multidetector CT imaging of the chest, abdomen and pelvis was performed following the standard protocol without IV contrast. COMPARISON:  CT abdomen dated 03/05/2007. FINDINGS: CT CHEST FINDINGS Cardiovascular: No thoracic aortic aneurysm. Heart size is within normal limits. Small pericardial effusion. Coronary artery calcifications, particularly dense within the LEFT anterior descending coronary artery. Mediastinum/Nodes: No mass or enlarged lymph nodes seen within the mediastinum or perihilar regions. Esophagus appears normal. Trachea and central bronchi are unremarkable. Lungs/Pleura: Faint central lobular nodules throughout both lungs. No pleural effusion or pneumothorax seen. Musculoskeletal: No acute or suspicious osseous finding. Mild degenerative  spurring within the midthoracic spine. CT ABDOMEN PELVIS FINDINGS Hepatobiliary: No focal liver abnormality is seen. Status post cholecystectomy. No biliary dilatation. Pancreas: Unremarkable. No pancreatic ductal dilatation or surrounding inflammatory changes. Spleen: Normal in size without focal abnormality. Adrenals/Urinary Tract: Both kidneys are atrophic. Transplant kidney in the RIGHT kidney appears normal in size and cortical thickness. No hydronephrosis. No renal or ureteral calculi associated with the transplant kidney. Bladder appears normal. Stomach/Bowel: No dilated large or small bowel loops. No focal bowel wall thickening or evidence of bowel wall inflammation seen. Stomach is unremarkable, partially decompressed. Appendix is unremarkable. Vascular/Lymphatic: Aortic atherosclerosis. No enlarged abdominal or pelvic lymph nodes. Reproductive: Uterus and bilateral adnexa are unremarkable. Other: No free fluid or abscess collection. No free intraperitoneal air. Musculoskeletal: No acute or suspicious osseous finding. Degenerative spondylosis within the lower lumbar spine, moderate in degree. IMPRESSION: 1. Faint centrilobular nodules throughout both lungs. Differential includes infections such as TB, atypical mycobacterium, viral pneumonia and fungal pneumonia (including invasive aspergillosis), hypersensitivity pneumonitis, and respiratory bronchiolitis. 2. No acute findings within the abdomen or pelvis. No bowel obstruction or evidence of bowel wall inflammation. No free fluid or abscess. No evidence of acute solid organ abnormality. No renal or ureteral calculi. 3. Small pericardial effusion. 4. Coronary artery calcifications, particularly dense within the LEFT anterior descending coronary artery. Recommend correlation with any possible associated cardiac symptoms. Aortic Atherosclerosis (ICD10-I70.0). Electronically Signed   By: Franki Cabot M.D.   On: 06/01/2018 13:47   Dg Chest 2 View  Result  Date: 06/01/2018 CLINICAL DATA:  Chest pain and RIGHT arm pain for 2 days worsened today, lightheaded, smoker, history diabetes mellitus, hypertension, renal transplant EXAM: CHEST - 2 VIEW COMPARISON:  11/03/2011 FINDINGS: Normal heart size, mediastinal contours, and pulmonary vascularity. Lungs clear. No pleural effusion or pneumothorax. Bones unremarkable. IMPRESSION: Normal exam. Electronically Signed   By: Lavonia Dana M.D.   On: 06/01/2018 09:26   Ct Chest Wo Contrast  Result Date: 06/01/2018 CLINICAL DATA:  RIGHT-sided chest pain radiating to back for the last 2 days with shortness of breath. Cough and diarrhea for a month, abdominal pain. EXAM:  CT CHEST, ABDOMEN AND PELVIS WITHOUT CONTRAST TECHNIQUE: Multidetector CT imaging of the chest, abdomen and pelvis was performed following the standard protocol without IV contrast. COMPARISON:  CT abdomen dated 03/05/2007. FINDINGS: CT CHEST FINDINGS Cardiovascular: No thoracic aortic aneurysm. Heart size is within normal limits. Small pericardial effusion. Coronary artery calcifications, particularly dense within the LEFT anterior descending coronary artery. Mediastinum/Nodes: No mass or enlarged lymph nodes seen within the mediastinum or perihilar regions. Esophagus appears normal. Trachea and central bronchi are unremarkable. Lungs/Pleura: Faint central lobular nodules throughout both lungs. No pleural effusion or pneumothorax seen. Musculoskeletal: No acute or suspicious osseous finding. Mild degenerative spurring within the midthoracic spine. CT ABDOMEN PELVIS FINDINGS Hepatobiliary: No focal liver abnormality is seen. Status post cholecystectomy. No biliary dilatation. Pancreas: Unremarkable. No pancreatic ductal dilatation or surrounding inflammatory changes. Spleen: Normal in size without focal abnormality. Adrenals/Urinary Tract: Both kidneys are atrophic. Transplant kidney in the RIGHT kidney appears normal in size and cortical thickness. No  hydronephrosis. No renal or ureteral calculi associated with the transplant kidney. Bladder appears normal. Stomach/Bowel: No dilated large or small bowel loops. No focal bowel wall thickening or evidence of bowel wall inflammation seen. Stomach is unremarkable, partially decompressed. Appendix is unremarkable. Vascular/Lymphatic: Aortic atherosclerosis. No enlarged abdominal or pelvic lymph nodes. Reproductive: Uterus and bilateral adnexa are unremarkable. Other: No free fluid or abscess collection. No free intraperitoneal air. Musculoskeletal: No acute or suspicious osseous finding. Degenerative spondylosis within the lower lumbar spine, moderate in degree. IMPRESSION: 1. Faint centrilobular nodules throughout both lungs. Differential includes infections such as TB, atypical mycobacterium, viral pneumonia and fungal pneumonia (including invasive aspergillosis), hypersensitivity pneumonitis, and respiratory bronchiolitis. 2. No acute findings within the abdomen or pelvis. No bowel obstruction or evidence of bowel wall inflammation. No free fluid or abscess. No evidence of acute solid organ abnormality. No renal or ureteral calculi. 3. Small pericardial effusion. 4. Coronary artery calcifications, particularly dense within the LEFT anterior descending coronary artery. Recommend correlation with any possible associated cardiac symptoms. Aortic Atherosclerosis (ICD10-I70.0). Electronically Signed   By: Franki Cabot M.D.   On: 06/01/2018 13:47    Procedures .Critical Care Performed by: Jola Schmidt, MD Authorized by: Jola Schmidt, MD    CRITICAL CARE Performed by: Jola Schmidt Total critical care time: 32 minutes Critical care time was exclusive of separately billable procedures and treating other patients. Critical care was necessary to treat or prevent imminent or life-threatening deterioration. Critical care was time spent personally by me on the following activities: development of treatment plan  with patient and/or surrogate as well as nursing, discussions with consultants, evaluation of patient's response to treatment, examination of patient, obtaining history from patient or surrogate, ordering and performing treatments and interventions, ordering and review of laboratory studies, ordering and review of radiographic studies, pulse oximetry and re-evaluation of patient's condition.   Medications Ordered in ED Medications  heparin ADULT infusion 100 units/mL (25000 units/280mL sodium chloride 0.45%) (1,350 Units/hr Intravenous New Bag/Given 06/01/18 1112)  morphine 4 MG/ML injection 4 mg (4 mg Intravenous Given 06/01/18 0925)  potassium chloride 10 mEq in 100 mL IVPB (0 mEq Intravenous Stopped 06/01/18 1241)  heparin bolus via infusion 5,000 Units (5,000 Units Intravenous Bolus from Bag 06/01/18 1114)     Initial Impression / Assessment and Plan / ED Course  I have reviewed the triage vital signs and the nursing notes.  Pertinent labs & imaging results that were available during my care of the patient were reviewed by me and considered in  my medical decision making (see chart for details).    The patient's symptoms concerning for pulmonary embolism given the pleuritic right-sided chest pain or shortness of breath and mild troponin elevation of 0.04.  EKG without ischemic changes.  Doubt acute coronary syndrome.  Patient with a solitary transplanted kidney.  She is not a candidate for contrasted study of her chest.  CT abdomen pelvis without contrast demonstrates no obvious acute pathology to explain her symptoms although she does have nonspecific symptoms within her pulmonary architecture which could be viral in nature and could lead to pleurisy.  Patient will be initiated on heparin at this time.  She has a mild AKI of her transplanted kidney.  Profound hypokalemia with a K of 2.3.  IV potassium replacement in the emergency department.  Admission the hospital.  VQ scan pending.  On a  heparin drip.  Repeat troponin pending.    Final Clinical Impressions(s) / ED Diagnoses   Final diagnoses:  None    ED Discharge Orders    None       Jola Schmidt, MD 06/01/18 1539    Jola Schmidt, MD 06/01/18 1540    Jola Schmidt, MD 06/01/18 1540

## 2018-06-01 NOTE — ED Notes (Signed)
Patient transported to X-ray 

## 2018-06-01 NOTE — ED Notes (Signed)
Pt. To nuclear med via stretcher.

## 2018-06-01 NOTE — Progress Notes (Signed)
ANTICOAGULATION CONSULT NOTE - Initial Consult  Pharmacy Consult for heparin Indication: pulmonary embolus  Allergies  Allergen Reactions  . Infed [Iron Dextran] Other (See Comments)    Pt has chest pain  Chest pain  . Cefazolin Itching and Nausea And Vomiting  . Ancef [Cefazolin Sodium] Other (See Comments)    Tolerated rocephin 11/03/10 Reports ancef causes itching, sweating, vomiting (x2-2009?)  . Lisinopril Swelling  . Other     Pt may be allergic to another antibiotic daughter isn't sure and pt unable to verify.    Patient Measurements: Ideal Body Weight: 77.6 kg  Heparin Dosing Weight: 82 kg   Vital Signs: BP: 129/70 (11/25 0945) Pulse Rate: 68 (11/25 0945)  Labs: Recent Labs    06/01/18 0856  HGB 10.5*  HCT 31.8*  PLT 209  CREATININE 2.26*  TROPONINI 0.04*    CrCl cannot be calculated (Unknown ideal weight.).   Medical History: Past Medical History:  Diagnosis Date  . Blood transfusion without reported diagnosis   . 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 GERD (gastroesophageal reflux disease)    medication induced  . Hypertension   . 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 Pharmacy consulted to start IV heparin due to concern for PE. H/H low, Plt wnl. She is not on any anticoagulation prior to admission.   Goal of Therapy:  Heparin level 0.3-0.7 units/ml Monitor platelets by anticoagulation protocol: Yes   Plan:  -Heparin 5000 units IV bolus, then start heparin infusion at 1350 units/hr -F/u 6 hr HL -Monitor daily HL, CBC and s/s of bleeding   -F/u chest CT to confirm PE   Albertina Parr, PharmD., BCPS Clinical Pharmacist Clinical phone for 06/01/18 until 3:30pm: [phone] If after 3:30pm, please refer to [facility] for unit-specific pharmacist

## 2018-06-01 NOTE — ED Notes (Signed)
Patient transported to CT 

## 2018-06-01 NOTE — ED Triage Notes (Signed)
Pt reports right sided chest pain into back for the last two days with shortness of breath and cough.

## 2018-06-01 NOTE — H&P (Addendum)
Date: 06/01/2018               Patient Name:  [name] MRN: [id number]  DOB: 1966-05-27 Age / Sex: 52 y.o., female   PCP: Tracey Heck, DO         Medical Service: Internal Medicine Teaching Service         Attending Physician: Dr. Annia Belt, MD    First Contact: Dr. Sharon [name] Pager: [phone]  Second Contact: Dr. Frederico [name] Pager: [phone]       After Hours (After 5p/  First Contact Pager: [phone]  weekends / holidays): Second Contact Pager: [phone]   Chief Complaint: Right sided chest pain  History of Present Illness:  [name] is a 52 yo female with PMH of renal transplant 2011 2/2 HTN, and TIIDM presenting today with right sided chest pain that is worse with deep inspiration, shortness of breath, and lightheadedness that began about three days ago. She denies recent trauma and states pain came on suddenly and has never happened before. Pain was somewhat helped with tylenol until she was playing with her one year old grandson and had him stand on her chest and felt intense pain yesterday. She endorses non-productive cough that only occurs with deep inspiration. She has some shortness of breath which is worse with exertion. She did endorse a feeling that her heart was racing two days ago but this only occurred once. Denies fever or night sweats. She has had 160 lb weight loss over the past two years that was intentional, although she states the last 40 lbs during the last few months was not intentional. She denies numbness or tingling, no left sided chest pain, nausea or recent vomiting. No recent illness, sick contacts, or travel. She has had diarrhea the past several months which was thought to be due to her magnesium supplement. This was reduced in July and again in October with only some  Improvement in symptoms. No recent medication changes.   Meds:  Current Meds  Medication Sig  . amLODipine (NORVASC) 10 MG tablet Take 10 mg by mouth daily.  [name]  atorvastatin (LIPITOR) 10 MG tablet Take 10 mg by mouth 3 (three) times a week. Taking on Monday, Wednesday, Friday.  . calcitRIOL (ROCALTROL) 0.25 MCG capsule Take 0.25 mcg by mouth 3 (three) times a week. Monday, Wednesday, Friday.  . Calcium-Phosphorus-Vitamin D (CITRACAL +D3 PO) Take 1 tablet by mouth. Mon, Wed, Friday nights only  . cloNIDine (CATAPRES) 0.2 MG tablet Take 0.2 mg by mouth 3 (three) times daily.  . famotidine (PEPCID) 20 MG tablet 40 mg daily with breakfast.  . insulin aspart (NOVOLOG) 100 UNIT/ML injection Inject 8-14 Units into the skin as needed for high blood sugar.   . labetalol (NORMODYNE) 300 MG tablet Take 300 mg by mouth 2 (two) times daily.   [name] losartan (COZAAR) 50 MG tablet TAKE 2 TABLETS(100 MG) BY MOUTH DAILY (Patient taking differently: Take 50 mg by mouth 2 (two) times daily. )  . magnesium oxide (MAG-OX) 400 MG tablet Take 400 mg by mouth 2 (two) times daily.  . mycophenolate (MYFORTIC) 180 MG EC tablet Take 720 mg by mouth.  . predniSONE (DELTASONE) 2.5 MG tablet Take 2.5 mg by mouth daily.  [name] PROGRAF 1 MG capsule Take 3 mg by mouth 2 (two) times daily.      Allergies: Allergies as of 06/01/2018 - Review Complete 06/01/2018  Allergen Reaction Noted  . Infed [iron dextran] Other (  See Comments) 12/09/2011  . Cefazolin Itching and Nausea And Vomiting 11/30/2015  . Ancef [cefazolin sodium] Other (See Comments) 11/03/2011  . Lisinopril Swelling 11/30/2015  . Other  11/03/2011   Past Medical History:  Diagnosis Date  . Blood transfusion without reported diagnosis   . 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 GERD (gastroesophageal reflux disease)    medication induced  . Hypertension   . 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 Hypertension Mother   . Diabetes Mother   . Atrial fibrillation Father   . Hypertension Father   . Arthritis Father   . Heart failure Sister   . Hypertension Sister        all 4 sisters  . Hypertension Brother        both brothers  . Diabetes Brother   . Colon cancer Neg Hx   . Stomach cancer Neg Hx      Social History:  Smokes a 1-2 cigarettes a couple times per week  Denies alcohol use or recreational drug use  Lives in [place], has support system with children and grandchildren  Review of Systems: A complete ROS was negative except as per HPI.   Physical Exam: Blood pressure 99/69, pulse 63, temperature 98.9 F (37.2 C), temperature source Oral, resp. rate 18, height '5\' 11"'$  (1.803 m), weight 83 kg, last menstrual period 11/05/2012, SpO2 100 %.  Constitution: NAD, lying supine in bed HEENT: no scleral icterus, no cervical or supraclavicular LN palpated  Cardio: RRR, no m/r/g Respiratory: CTAB, no wheezing or rales Abdominal: +BS, NTTP, non-distended, soft MSK: moving all extremities, left upper and lower chest TTP Neuro: a&o, cooperative, normal affect  Skin: + axillary lymphadenopathy, single mobile node   CT Chest, Abdomen Pelvis wo Contrast 06/01/2018 IMPRESSION: 1. Faint centrilobular nodules throughout both lungs. Differential includes infections such as TB, atypical mycobacterium, viral pneumonia and fungal pneumonia (including invasive aspergillosis), hypersensitivity pneumonitis, and respiratory bronchiolitis. 2. No acute findings within the abdomen or pelvis. No bowel obstruction or evidence of bowel wall inflammation. No free fluid or abscess. No evidence of acute solid organ abnormality. No renal or ureteral calculi. 3. Small pericardial effusion. 4. Coronary artery calcifications, particularly dense within the LEFT anterior  descending coronary artery. Recommend correlation with any possible associated cardiac symptoms.  Aortic Atherosclerosis (ICD10-I70.0).  Electronically Signed   By: [name] M.D.   On: 06/01/2018 13:47  V/Q Scan 11/25 IMPRESSION: Diminished ventilation RIGHT apex with normal perfusion.  Diminished perfusion in LEFT lower lobe diffusely versus ventilation, though perfusion is seen extending to the periphery of the lung.  By PIOPED II criteria, a solitary lobar mismatch represents a low probability for pulmonary embolism. Electronically Signed   By: Lavonia Dana M.D.   On: 06/01/2018 18:06  EKG: personally reviewed my interpretation is left bundle branch block also seen on EKG 2013, and new irregular rhythm   CXR: personally reviewed my interpretation is right sided enlarged hilar structures    Assessment & Plan by Problem: Active Problems:   HTN (hypertension)   Type II diabetes mellitus, well controlled ([place])   S/p  cadaver renal transplant   Pleuritic chest pain   52 yo female with PMH of renal transplant 2011 2/2 HTN, and TIIDM presenting today with right sided chest pain that is worse with deep inspiration, shortness of breath, and lightheadedness that began about three days ago.  Pleural Chest Pain Two days of right sided chest pain and non-productive cough with deep inspiration, weakness, and lightheadedness. Area of upper and lower chest TTP with deeper palpation. CXR w/enlarged right sided hilar structures and CT significant for faint centrilobular nodules throughout both lungs. Would be concerned for infection considering immunosuppressive medications and CT but no other signs or symptoms of infection such as leukocytosis or fever. Bone mets is also of concern. PE significant for possible right mobile palpable lymph node. No history of cancer, and she had recent mammogram with no concerning findings. Alk phos is elevated at 479 with no elevated liver enzymes. Pain  is unilateral and herpes zoster also cannot be ruled out at this time although pain seems to be more internal. Heparin started in ED but V/Q scan and Geneva score both show low probability for PE.   - am GGT, fractionated alk phos unavailable - am CBC - hold heparin  - consider bone scan after 24 hrs s/p V/Q scan  Chronic Diarrhea Hypokalemia Potassium 2.3, possibly secondary to chronic diarrhea for the past six months. Diarrhea initially thought to be secondary to magnesium, which was decreased once in June and again in October to 400 mg qd.   - fecal fat - am Mg, BMP  - Potassium chloride 40 mEq po, IV 10 mEq x 2  Hypertension Cont. Home labetalol 300 mg bid, clonidine .2 mg tid, norvasc 10 mg qd. Holding losartan 50 mg qd although she was taking this bid.   TIIDM  - SSI sensitive   Kidney Transplant 2011  - continuing mycophenolate 720 mg bid  - cont. Tacrolimus 3 mg bid   Diet: renal/carb modified VTE: lovenox IVF: none Code: full   Dispo: Admit patient to Inpatient with expected length of stay greater than 2 midnights.  SignedMarty Heck, DO 06/01/2018, 6:32 PM  Pager: [phone]

## 2018-06-01 NOTE — ED Notes (Signed)
Pt aware of need for urine sample, ambulated to bathroom but was unable to provide urine sample.

## 2018-06-02 ENCOUNTER — Encounter (HOSPITAL_COMMUNITY): Payer: Self-pay | Admitting: *Deleted

## 2018-06-02 DIAGNOSIS — E876 Hypokalemia: Secondary | ICD-10-CM

## 2018-06-02 LAB — CBC
HCT: 26.4 % — ABNORMAL LOW (ref 36.0–46.0)
Hemoglobin: 8.5 g/dL — ABNORMAL LOW (ref 12.0–15.0)
MCH: 31.6 pg (ref 26.0–34.0)
MCHC: 32.2 g/dL (ref 30.0–36.0)
MCV: 98.1 fL (ref 80.0–100.0)
NRBC: 0 % (ref 0.0–0.2)
Platelets: 193 10*3/uL (ref 150–400)
RBC: 2.69 MIL/uL — ABNORMAL LOW (ref 3.87–5.11)
RDW: 17.4 % — ABNORMAL HIGH (ref 11.5–15.5)
WBC: 3.2 10*3/uL — AB (ref 4.0–10.5)

## 2018-06-02 LAB — COMPREHENSIVE METABOLIC PANEL
ALK PHOS: 365 U/L — AB (ref 38–126)
ALT: 13 U/L (ref 0–44)
AST: 13 U/L — ABNORMAL LOW (ref 15–41)
Albumin: 3.2 g/dL — ABNORMAL LOW (ref 3.5–5.0)
Anion gap: 7 (ref 5–15)
BUN: 21 mg/dL — ABNORMAL HIGH (ref 6–20)
CALCIUM: 7.1 mg/dL — AB (ref 8.9–10.3)
CO2: 14 mmol/L — AB (ref 22–32)
Chloride: 120 mmol/L — ABNORMAL HIGH (ref 98–111)
Creatinine, Ser: 2.16 mg/dL — ABNORMAL HIGH (ref 0.44–1.00)
GFR, EST AFRICAN AMERICAN: 29 mL/min — AB (ref >60)
GFR, EST NON AFRICAN AMERICAN: 25 mL/min — AB (ref >60)
Glucose, Bld: 106 mg/dL — ABNORMAL HIGH (ref 70–99)
Potassium: 3.2 mmol/L — ABNORMAL LOW (ref 3.5–5.1)
Sodium: 141 mmol/L (ref 135–145)
Total Bilirubin: 0.4 mg/dL (ref 0.3–1.2)
Total Protein: 5.2 g/dL — ABNORMAL LOW (ref 6.5–8.1)

## 2018-06-02 LAB — GLUCOSE, CAPILLARY
Glucose-Capillary: 85 mg/dL (ref 70–99)
Glucose-Capillary: 114 mg/dL — ABNORMAL HIGH (ref 70–99)
Glucose-Capillary: 131 mg/dL — ABNORMAL HIGH (ref 70–99)

## 2018-06-02 LAB — HIV ANTIBODY (ROUTINE TESTING W REFLEX): HIV Screen 4th Generation wRfx: NONREACTIVE

## 2018-06-02 LAB — TROPONIN I
TROPONIN I: 0.04 ng/mL — AB (ref <0.03)
Troponin I: 0.04 ng/mL (ref <0.03)

## 2018-06-02 LAB — MAGNESIUM: Magnesium: 1.9 mg/dL (ref 1.7–2.4)

## 2018-06-02 LAB — CK: CK TOTAL: 93 U/L (ref 38–234)

## 2018-06-02 LAB — GAMMA GT: GGT: 11 U/L (ref 7–50)

## 2018-06-02 MED ORDER — CALCIUM CARBONATE ANTACID 500 MG PO CHEW
1000.0000 mg | CHEWABLE_TABLET | Freq: Two times a day (BID) | ORAL | Status: AC
  Administered 2018-06-02 – 2018-06-03 (×3): 1000 mg via ORAL
  Filled 2018-06-02 (×3): qty 5

## 2018-06-02 MED ORDER — POTASSIUM CHLORIDE CRYS ER 20 MEQ PO TBCR
20.0000 meq | EXTENDED_RELEASE_TABLET | Freq: Three times a day (TID) | ORAL | Status: AC
  Administered 2018-06-02 (×3): 20 meq via ORAL
  Filled 2018-06-02 (×3): qty 1

## 2018-06-02 MED ORDER — ENOXAPARIN SODIUM 40 MG/0.4ML ~~LOC~~ SOLN
40.0000 mg | SUBCUTANEOUS | Status: DC
  Administered 2018-06-02 – 2018-06-03 (×2): 40 mg via SUBCUTANEOUS
  Filled 2018-06-02 (×2): qty 0.4

## 2018-06-02 NOTE — Progress Notes (Addendum)
Nutrition Brief Note  Patient identified on the Malnutrition Screening Tool (MST) Report  Wt Readings from Last 15 Encounters:  06/01/18 83 kg  05/13/18 84.1 kg  04/29/18 84.5 kg  04/22/18 85 kg  02/04/18 88.5 kg  05/21/17 98.4 kg  04/16/17 99.8 kg  04/02/17 99.8 kg  02/13/17 104.3 kg  02/12/17 104.6 kg  12/13/15 135.6 kg  11/30/15 135.6 kg  10/30/15 (!) 138.3 kg  08/29/14 (!) 144.6 kg  07/27/14 (!) 145.1 kg    52 yo female with PMH of renal transplant 2011 2/2 HTN, and TIIDM presenting today with right sided chest pain that is worse with deep inspiration, shortness of breath, and lightheadedness that began about three days ago.  Pt admitted with pleural chest pain and diarrhea.   Spoke with pt at bedside, who reports good appetite. She denies any changes in her diet over the past year. Typical intake is 2-3 meals per day (Breakfast: coffee with eggs and toast, Lunch: sandwich or leftovers, Dinner: meat, starch, and vegetable). Pt reports she did not eat much breakfast due to not liking the taste of food options (noted meal completion 75%).   Pt reports 165# intentional weight loss over the past 2 years. Per pt, she initially lost weight for kidney transplant prior to 2011, however, gained weight due to steroids and medication side effects. Pt made a conscious effort to make lifestyle changes to lose weight  ("I really worked on my diet"). Noted pt has experienced a 15.6% wt loss over the past year and only a 1.8% wt loss over the past months, which is not significant for time frame (and not consistent with previously reports 40# weight loss in one month per H&P). Pt continues to walk approximately 30 minutes per day.  Nutrition-Focused physical exam completed. Findings are no fat depletion, mild muscle depletion, and no edema. Noted loose skin on upper and lower extremities, likely related to pt's intentional weight loss.   Last Hgb A1c: 4.8 (12/02/17). PTA DM medications are 8-14  units insulin aspart TID with meals. Discussed home DM control with pt. Due to her weight loss and lifestyle modifications, pt has been able to better control her DM. She checks CBGS 3 times per day, which typically run in the 80's. She is on a sliding scale insulin at meals, which she takes when her blood sugar runs high.   Labs reviewed: K: 3.2, CBGS: 85-112 (inpatient orders for glycemic control are 0-5 units insulin aspart q HS, 0-9 units insulin aspart TID with meals).   Body mass index is 25.52 kg/m. Patient meets criteria for overweight based on current BMI.   Current diet order is renal/ carb modified with 1200 ml fluid restriction, patient is consuming approximately 75% of meals at this time. Labs and medications reviewed.   No nutrition interventions warranted at this time. If nutrition issues arise, please consult RD.   Daylin Eads A. Jimmye Norman, RD, LDN, CDE Pager: [phone] After hours Pager: [phone]

## 2018-06-02 NOTE — Discharge Summary (Signed)
Name: [name] MRN: [id number] DOB: 08/23/1965 52 y.o. PCP: [name], DO  Date of Admission: 06/01/2018  8:48 AM Date of Discharge: 06/04/2018 Attending Physician: Aldine Contes, MD  Discharge Diagnosis: 1. Pleuritic Chest Pain 2. Hyperparathyroidism  Discharge Medications: Allergies as of 06/04/2018      Reactions   Infed [iron Dextran] Other (See Comments)   Pt has chest pain  Chest pain   Cefazolin Itching, Nausea And Vomiting   Ancef [cefazolin Sodium] Other (See Comments)   Tolerated rocephin 11/03/10 Reports ancef causes itching, sweating, vomiting (x2-2009?)   Lisinopril Swelling   Other    Pt may be allergic to another antibiotic daughter isn't sure and pt unable to verify.      Medication List    TAKE these medications   amLODipine 10 MG tablet Commonly known as:  NORVASC Take 10 mg by mouth daily.   atorvastatin 10 MG tablet Commonly known as:  LIPITOR Take 10 mg by mouth 3 (three) times a week. Taking on Monday, Wednesday, Friday.   calcitRIOL 0.25 MCG capsule Commonly known as:  ROCALTROL Take 0.25 mcg by mouth 3 (three) times a week. Monday, Wednesday, Friday.   CITRACAL +D3 PO Take 1 tablet by mouth. Mon, Wed, Friday nights only   cloNIDine 0.2 MG tablet Commonly known as:  CATAPRES Take 1 tablet (0.2 mg total) by mouth 2 (two) times daily. What changed:  when to take this   famotidine 20 MG tablet Commonly known as:  PEPCID 40 mg daily with breakfast.   insulin aspart 100 UNIT/ML injection Commonly known as:  novoLOG Inject 8-14 Units into the skin as needed for high blood sugar.   labetalol 200 MG tablet Commonly known as:  NORMODYNE Take 1 tablet (200 mg total) by mouth 2 (two) times daily. What changed:    medication strength  how much to take   losartan 50 MG tablet Commonly known as:  COZAAR TAKE 2 TABLETS(100 MG) BY MOUTH DAILY What changed:  See the new instructions.   magnesium oxide 400 MG  tablet Commonly known as:  MAG-OX Take 400 mg by mouth 2 (two) times daily.   mycophenolate 180 MG EC tablet Commonly known as:  MYFORTIC Take 3 tablets (540 mg total) by mouth 2 (two) times daily. What changed:    how much to take  when to take this   potassium chloride SA 20 MEQ tablet Commonly known as:  K-DUR,KLOR-CON Take 0.5 tablets (10 mEq total) by mouth daily.   predniSONE 2.5 MG tablet Commonly known as:  DELTASONE Take 2.5 mg by mouth daily.   PROGRAF 1 MG capsule Generic drug:  tacrolimus Take 3 mg by mouth 2 (two) times daily.       Disposition and follow-up:   [name] was discharged from [facility] in Stable condition.  At the hospital follow up visit please address:  1.  Pleuritic Chest Pain: no specific etiology found but likely secondary to MSK vs. Osteomalacia from CKD and hyperparathyroidism. Assess tenderness and breathing. Pain resolving at discharge, given incentive spirometer at discharge. CT did show non-specific centrilobular nodules but infectious cause found to be unlikely.  Secondary Hyperparathyroidism: electrolyte abnormalities likely worsened 2/2 CKD s/p renal transplant for HTN in 2011. Calcitriol may need to be increased, Vitamin D and PTH pending. She sees Dr. Marval Regal, please confirm close follow-up. Admitted with K+ 2.2, remained at 3.3 for three days even giving 60 mEq per day. Discharged with  10 mEq for five days after another 60 given day of discharge. Also has chronically elevated alk phos 2/2 hyperpara and tacrolimus.  Hypomagnesemia: Previously decreased due to chronic diarrhea. She will need to continue her Mg supplement as tacrolimus will cont. To cause drop in Mg. D/c with instructions to cont. 400 mg bid  Diarrhea: Did not occur during admission but chronic problem likely secondary to Mg supplementation, which is necessary for Tacrolimus, which causes hypomagnesemia. Diarrhea also can be caused by  mycophenolate. Mycophenolate decreased prior to discharge by nephro.  Hypotension: consistently bradycardic with soft BP throughout admission. Likely HTN resolving due to intentional wt loss. She was discharged with decrease in labetalol from 300 mg bid to 200 mg bid and decrease in clonidine .'2mg'$  tid to bid.    2.  Labs / imaging needed at time of follow-up: BMP, Mg, CBC, blood pressure  3.  Pending labs/ test needing follow-up: PTH, tacrolimus, Vitamin D    Hospital Course by problem list: Pleuritic Chest Pain [name] is a 52 yo female with PMH of renal transplant 2011 2/2 HTN, and TIIDM who presented with right sided chest pain that is worse with deep inspiration, shortness of breath, and lightheadedness that began about three days prior to admission. She was also found to be hypokalemic, hypomagnesemic, hypophosphatemic, and hypocalcemic even with corrected calcium for hypoalbuminemia. She had recently decreased Mg supplementation for her chronic hypomagnesemia 2/2 immunosuppresive medications.  Troponins wnl. Chest xray showed no acute changes while CT showed bilateral centrilobular nodules, more focused on the right. She was started on heparin in the ED for concern for PE and V/Q scan was ordered, which showed low probability for PE. Infective cause thought to be unlikely as she had been afebrile, no cough, and no leukocytosis. Blood work and imaging showed no sign of malignant cause. Bone scan showed diffuse uptake significant for "super scan," likely related to osteodystrophy or osteomalacia from hyperparathyroidism 2/2 CKD. Her symptoms resolved on their own during hospital stay and may possibly be MSK in origin as well.   Hyperparathyroidism, CKD, Immunosuppressive Agents s/p renal transplant Found to be hypokalemic, hypomagnesemic, hypophosphatemic, and hypocalcemic even with corrected calcium for hypoalbuminemia and to have chronically elevated alkaline phosphatase. GGT, CK wnl.  Alk phos elevation likely elevated secondary to hyperparathyroidism and tacrolimus, which causes elevation. This has been present since around 2017. Hypomagnesemia secondary to tacrolimus, which is why she takes magnesium supplement. This was recently decreased due to causing diarrhea. Discussed with patient that this is a necessary supplement, and also counseled on diet high in Mg. She takes .25 mcg calcitriol for secondary hyperparathyroidism, and this may need to be increased. PTH and vit D levels were pending at discharge. Electrolytes repleted throughout admission although K was persistently low. She was discharged with 40mq K+ and will need close follow-up with Dr. CMarval Regal   Hypotension History of hypertension, which has likely decreased over time with intentional weight loss over the past two years. She was consistently bradycardic with soft BP throughout admission. She was discharged with decrease in labetalol from 300 mg bid to 200 mg bid and decrease in clonidine .'2mg'$  tid to bid.   Discharge Vitals:   BP 112/75   Pulse (!) 59   Temp 98 F (36.7 C) (Oral)   Resp 15   Ht '5\' 11"'$  (1.803 m)   Wt 83 kg   LMP 11/05/2012   SpO2 100%   BMI 25.52 kg/m   Bone Scan 11/28 IMPRESSION:  Nonfocal diffuse axial and appendicular skeletal uptake. No renal activity. Findings are consistent with a "super scan" which is nonspecific. Differential includes diffuse metastasis, renal osteodystrophy, myelofibrosis, disorder of calcium metabolism, hyperthyroidism, mastocytosis, lymphoproliferative disease, or osteopetrosis.   Electronically Signed   By: Kristine Garbe M.D.   On: 06/03/2018 14:16  Pertinent Labs, Studies, and Procedures:   Troponin: .04 >> .04 >> .04  CT Chest Abdomen Pelvix WO Contrast 11/25 IMPRESSION: 1. Faint centrilobular nodules throughout both lungs. Differential includes infections such as TB, atypical mycobacterium, viral pneumonia and fungal pneumonia  (including invasive aspergillosis), hypersensitivity pneumonitis, and respiratory bronchiolitis. 2. No acute findings within the abdomen or pelvis. No bowel obstruction or evidence of bowel wall inflammation. No free fluid or abscess. No evidence of acute solid organ abnormality. No renal or ureteral calculi. 3. Small pericardial effusion. 4. Coronary artery calcifications, particularly dense within the LEFT anterior descending coronary artery. Recommend correlation with any possible associated cardiac symptoms.  Aortic Atherosclerosis (ICD10-I70.0). Electronically Signed   By: Franki Cabot M.D.   On: 06/01/2018 13:47   V/Q Scan 11/25 IMPRESSION: Diminished ventilation RIGHT apex with normal perfusion.  Diminished perfusion in LEFT lower lobe diffusely versus ventilation, though perfusion is seen extending to the periphery of the lung.  By PIOPED II criteria, a solitary lobar mismatch represents a low probability for pulmonary embolism. Electronically Signed   By: Lavonia Dana M.D.   On: 06/01/2018 18:06  Chest XR 11/25: IMPRESSION: Normal exam. Electronically Signed   By: Lavonia Dana M.D.   On: 06/01/2018 09:26  Discharge Instructions: Discharge Instructions    Diet - low sodium heart healthy   Complete by:  As directed    Discharge instructions   Complete by:  As directed    You were hospitalized for Pleuritic Chest Pain and Low Potassium. Thank you for allowing [place] to be part of your care.   We will have the Internal Medicine Clinic call you to schedule a follow-up appointment when they open again on Monday, December 2nd. Please schedule an appointment early in the week for close follow-up   Please also call Dr. Marval Regal to schedule follow-up appointment for your low magnesium, low potassium, and recent medication changes.    Please note these changes made to your medications:   Please START taking:   For your low blood pressure and heart rate please note  the following changes to your medications:   Labetalol (NORMODYNE) 200 mg tablet twice per day. Please stop taking your 300 mg tablet  Clonidine .'2mg'$  tablet two times per day, every 12 hours  If you notice that your blood pressure becomes elevated again, you may increase your blood pressure medication again.   For your low potassium and low magnesium:  Please START taking your MAGNESIUM supplement at least TWO TIMES PER DAY. If you have some diarrhea, you may decrease this occassionally, but it is important to take this as previous directed as your magnesium was low when you were admitted to the hospital. You also were given information on foods high in magnesium, and it will help to eat these times of food.  Please also START taking Potassium Chloride (K-DUR) 10 mEQ 1/2 tablet once per day for the next five days.   For your kidney transplant medication:  Please DECREASE your mycophenolate (MYFORTIC) 180 mg tablet to THREE tablets two times a day   If you began to feel short of breath, feel dizzy or ill, develop fever, or have severely increased  pain, please do not hesitate to return to the emergency department.   Please call our clinic if you have any questions or concerns, we may be able to help and keep you from a long and expensive emergency room wait. Our clinic and after hours phone number is [phone], the best time to call is Monday through Friday 9 am to 4 pm but there is always someone available 24/7 if you have an emergency. If you need medication refills please notify your pharmacy one week in advance and they will send [place] a request.   Increase activity slowly   Complete by:  As directed       Signed: Molli Hazard A, DO 06/04/2018, 10:36 AM   Pager: [phone]

## 2018-06-02 NOTE — Care Management Note (Signed)
Case Management Note  Patient Details  Name: [name] MRN: [id number] Date of Birth: 02/29/1966  Subjective/Objective:    From home, her daughter lives with her, presents with pleural chest pain, chronic diarrhea, hx of renal txpt, htn, dm2.               Action/Plan: NCM will follow for transitional care needs.   Expected Discharge Date:                  Expected Discharge Plan:     In-House Referral:     Discharge planning Services  CM Consult  Post Acute Care Choice:    Choice offered to:     DME Arranged:    DME Agency:     HH Arranged:    HH Agency:     Status of Service:  In process, will continue to follow  If discussed at Long Length of Stay Meetings, dates discussed:    Additional Comments:  Zenon Mayo, RN 06/02/2018, 4:42 PM

## 2018-06-02 NOTE — Progress Notes (Signed)
Subjective: Patient reports pain is unchanged this morning.   Objective: Vital signs in last 24 hours: Vitals:   06/01/18 2352 06/02/18 0823 06/02/18 0944 06/02/18 0951  BP: (!) 97/50 110/66  111/63  Pulse: (!) 55 (!) 48 80 69  Resp: 12     Temp: 98.4 F (36.9 C) 97.7 F (36.5 C)    TempSrc: Oral     SpO2: 100% (!) 89%    Weight:      Height:        Intake/Output Summary (Last 24 hours) at 06/02/2018 1055 Last data filed at 06/02/2018 0318 Gross per 24 hour  Intake 810 ml  Output -  Net 810 ml     Micro Results: No results found for this or any previous visit (from the past 240 hour(s)). Studies/Results: Ct Abdomen Pelvis Wo Contrast  Result Date: 06/01/2018 CLINICAL DATA:  RIGHT-sided chest pain radiating to back for the last 2 days with shortness of breath. Cough and diarrhea for a month, abdominal pain. EXAM: CT CHEST, ABDOMEN AND PELVIS WITHOUT CONTRAST TECHNIQUE: Multidetector CT imaging of the chest, abdomen and pelvis was performed following the standard protocol without IV contrast. COMPARISON:  CT abdomen dated 03/05/2007. FINDINGS: CT CHEST FINDINGS Cardiovascular: No thoracic aortic aneurysm. Heart size is within normal limits. Small pericardial effusion. Coronary artery calcifications, particularly dense within the LEFT anterior descending coronary artery. Mediastinum/Nodes: No mass or enlarged lymph nodes seen within the mediastinum or perihilar regions. Esophagus appears normal. Trachea and central bronchi are unremarkable. Lungs/Pleura: Faint central lobular nodules throughout both lungs. No pleural effusion or pneumothorax seen. Musculoskeletal: No acute or suspicious osseous finding. Mild degenerative spurring within the midthoracic spine. CT ABDOMEN PELVIS FINDINGS Hepatobiliary: No focal liver abnormality is seen. Status post cholecystectomy. No biliary dilatation. Pancreas: Unremarkable. No pancreatic ductal dilatation or surrounding inflammatory changes.  Spleen: Normal in size without focal abnormality. Adrenals/Urinary Tract: Both kidneys are atrophic. Transplant kidney in the RIGHT kidney appears normal in size and cortical thickness. No hydronephrosis. No renal or ureteral calculi associated with the transplant kidney. Bladder appears normal. Stomach/Bowel: No dilated large or small bowel loops. No focal bowel wall thickening or evidence of bowel wall inflammation seen. Stomach is unremarkable, partially decompressed. Appendix is unremarkable. Vascular/Lymphatic: Aortic atherosclerosis. No enlarged abdominal or pelvic lymph nodes. Reproductive: Uterus and bilateral adnexa are unremarkable. Other: No free fluid or abscess collection. No free intraperitoneal air. Musculoskeletal: No acute or suspicious osseous finding. Degenerative spondylosis within the lower lumbar spine, moderate in degree. IMPRESSION: 1. Faint centrilobular nodules throughout both lungs. Differential includes infections such as TB, atypical mycobacterium, viral pneumonia and fungal pneumonia (including invasive aspergillosis), hypersensitivity pneumonitis, and respiratory bronchiolitis. 2. No acute findings within the abdomen or pelvis. No bowel obstruction or evidence of bowel wall inflammation. No free fluid or abscess. No evidence of acute solid organ abnormality. No renal or ureteral calculi. 3. Small pericardial effusion. 4. Coronary artery calcifications, particularly dense within the LEFT anterior descending coronary artery. Recommend correlation with any possible associated cardiac symptoms. Aortic Atherosclerosis (ICD10-I70.0). Electronically Signed   By: Franki Cabot M.D.   On: 06/01/2018 13:47   Dg Chest 2 View  Result Date: 06/01/2018 CLINICAL DATA:  Chest pain and RIGHT arm pain for 2 days worsened today, lightheaded, smoker, history diabetes mellitus, hypertension, renal transplant EXAM: CHEST - 2 VIEW COMPARISON:  11/03/2011 FINDINGS: Normal heart size, mediastinal  contours, and pulmonary vascularity. Lungs clear. No pleural effusion or pneumothorax. Bones unremarkable. IMPRESSION: Normal exam. Electronically Signed  By: Lavonia Dana M.D.   On: 06/01/2018 09:26   Ct Chest Wo Contrast  Result Date: 06/01/2018 CLINICAL DATA:  RIGHT-sided chest pain radiating to back for the last 2 days with shortness of breath. Cough and diarrhea for a month, abdominal pain. EXAM: CT CHEST, ABDOMEN AND PELVIS WITHOUT CONTRAST TECHNIQUE: Multidetector CT imaging of the chest, abdomen and pelvis was performed following the standard protocol without IV contrast. COMPARISON:  CT abdomen dated 03/05/2007. FINDINGS: CT CHEST FINDINGS Cardiovascular: No thoracic aortic aneurysm. Heart size is within normal limits. Small pericardial effusion. Coronary artery calcifications, particularly dense within the LEFT anterior descending coronary artery. Mediastinum/Nodes: No mass or enlarged lymph nodes seen within the mediastinum or perihilar regions. Esophagus appears normal. Trachea and central bronchi are unremarkable. Lungs/Pleura: Faint central lobular nodules throughout both lungs. No pleural effusion or pneumothorax seen. Musculoskeletal: No acute or suspicious osseous finding. Mild degenerative spurring within the midthoracic spine. CT ABDOMEN PELVIS FINDINGS Hepatobiliary: No focal liver abnormality is seen. Status post cholecystectomy. No biliary dilatation. Pancreas: Unremarkable. No pancreatic ductal dilatation or surrounding inflammatory changes. Spleen: Normal in size without focal abnormality. Adrenals/Urinary Tract: Both kidneys are atrophic. Transplant kidney in the RIGHT kidney appears normal in size and cortical thickness. No hydronephrosis. No renal or ureteral calculi associated with the transplant kidney. Bladder appears normal. Stomach/Bowel: No dilated large or small bowel loops. No focal bowel wall thickening or evidence of bowel wall inflammation seen. Stomach is unremarkable,  partially decompressed. Appendix is unremarkable. Vascular/Lymphatic: Aortic atherosclerosis. No enlarged abdominal or pelvic lymph nodes. Reproductive: Uterus and bilateral adnexa are unremarkable. Other: No free fluid or abscess collection. No free intraperitoneal air. Musculoskeletal: No acute or suspicious osseous finding. Degenerative spondylosis within the lower lumbar spine, moderate in degree. IMPRESSION: 1. Faint centrilobular nodules throughout both lungs. Differential includes infections such as TB, atypical mycobacterium, viral pneumonia and fungal pneumonia (including invasive aspergillosis), hypersensitivity pneumonitis, and respiratory bronchiolitis. 2. No acute findings within the abdomen or pelvis. No bowel obstruction or evidence of bowel wall inflammation. No free fluid or abscess. No evidence of acute solid organ abnormality. No renal or ureteral calculi. 3. Small pericardial effusion. 4. Coronary artery calcifications, particularly dense within the LEFT anterior descending coronary artery. Recommend correlation with any possible associated cardiac symptoms. Aortic Atherosclerosis (ICD10-I70.0). Electronically Signed   By: Franki Cabot M.D.   On: 06/01/2018 13:47   Nm Pulmonary Vent And Perf (v/q Scan)  Result Date: 06/01/2018 CLINICAL DATA:  Chest wall pain, pleuritic RIGHT chest pain, symptoms for 3 days, shortness of breath for 1.5 days, cough, history of transplant kidney in worsening renal function, diabetes mellitus, hypertension, GERD, smoker EXAM: NUCLEAR MEDICINE VENTILATION - PERFUSION LUNG SCAN TECHNIQUE: Ventilation images were obtained in multiple projections using inhaled aerosol [id number]. Perfusion images were obtained in multiple projections after intravenous injection of Tc-941mAA. RADIOPHARMACEUTICALS:  31.6 mCi of Tc-9929mPA aerosol inhalation and 4.2 mCi Tc99m36m IV COMPARISON:  None Correlation: Chest radiograph 06/01/2018 FINDINGS: Ventilation: Central airway  deposition of aerosol. Swallowed aerosol within esophagus and stomach. Diminished ventilation RIGHT apex. Mild enlargement of cardiac silhouette. Perfusion: Mild enlargement of cardiac silhouette. Diminished though not absent perfusion throughout LEFT lower lobe. Remaining perfusion normal. Chest radiograph: Clear lungs. IMPRESSION: Diminished ventilation RIGHT apex with normal perfusion. Diminished perfusion in LEFT lower lobe diffusely versus ventilation, though perfusion is seen extending to the periphery of the lung. By PIOPED II criteria, a solitary lobar mismatch represents a low probability for pulmonary embolism. Electronically  Signed   By: Lavonia Dana M.D.   On: 06/01/2018 18:06   Medications:  Scheduled Meds: . amLODipine  10 mg Oral Daily  . [START ON 06/03/2018] atorvastatin  10 mg Oral Once per day on Mon Wed Fri  . [START ON 06/03/2018] calcitRIOL  0.25 mcg Oral Once per day on Mon Wed Fri  . calcium carbonate  1,000 mg of elemental calcium Oral BID  . cloNIDine  0.2 mg Oral TID  . enoxaparin (LOVENOX) injection  40 mg Subcutaneous Q24H  . famotidine  20 mg Oral Daily  . insulin aspart  0-5 Units Subcutaneous QHS  . insulin aspart  0-9 Units Subcutaneous TID WC  . labetalol  300 mg Oral BID  . mycophenolate  720 mg Oral BID  . potassium chloride  20 mEq Oral TID  . predniSONE  2.5 mg Oral Daily  . sodium chloride flush  3 mL Intravenous Q12H  . tacrolimus  3 mg Oral BID   Continuous Infusions: PRN Meds:.acetaminophen **OR** acetaminophen Assessment/Plan: Active Problems:   HTN (hypertension)   Type II diabetes mellitus, well controlled ([place])   S/p cadaver renal transplant   Pleuritic chest pain   Hypokalemia  [name] is a 52 yo female with medical history significant for renal transplant (DDKT) in  2011, HTN, and TIIDM, who presented with 3 day history of right sided chest pain along the T6 dermatone and in the RUQ of breast that is worse with deep inspiration; and  found to have electrolyte abnormalities on admission  suggestive of a non-anion gap metabolic acidosis but hemodynamically stable with unchanged physical exam this AM,    Pleural Chest Pain:  Persistently exquisitely TTP  subcostally on exam.  CXR w. hyperdense sided hilar structures and CT Chest significant for faint centrilobular nodules throughout both lungs, concerning for atypical infection (non-bacterial, fungal, TB) especially in the setting of an immunosuppressed patient.  Leukocytopenic today '@3'$ .2K/uL and afebrile, with no signs of systemic infection. Alk phos less elevated today at 365 (prev 479)  without elevation of liver enzymes raises concerns for bone metastasis despite no prior history of cancer and no abnormal bone findings on radiographic studies.  - am GGT, fractionated alk phos unavailable; f/u aldolase, CK - am CBC - consider bone scan after 24 hrs s/p V/Q scan - consider respiratory viral pathogen panel should clinical picture devolve to include hemodynamic instability and/or  more systemic signs and symptoms of infection.   Chronic Diarrhea (non-anion gap metabolic acidosis): with hypocalcemia in the setting of mild hypoalbuminemia, and with improving hypokalemia (potassium 3.2 today, prev 2.2 s/p 38mq K).  Malabsorption/malnutrition under consideration - f/u fecal fat stool study  - am Mg, BMP  - replete electrolytes as needed, consider abg if lab studies and clinical picture remains unimproved.   S/p Renal Transplant (DDKT, 2011): Creatinine 2.16 today, (prev 2.26 on admission) Baseline 1.8-2..Marland KitchenAcute kidney injury, glomerular in nature, from tacrolimus could explain renal function and electrolyte abnormalities; cannot rule out nephrotoxic anti-hypertensive, holding losartan; cannot rule out non-iatrogenic decline in kidney function.  - consulting nephrology, and appreciate any recommendations - continuing mycophenolate 720 mg bid  - cont. Tacrolimus 3 mg bid   -  prednisone 2.'5mg'$  qd  Hypertension: Hypotensive this AM at 97/50;  -  Cont. All home antihypertensive with the exception of losartan '50mg'$  bid for concerns of aki:  labetalol 300 mg bid, clonidine .2 mg tid, norvasc 10 mg qd. -CTM    TIIDM - SSI sensitive  Diet: renal/carb modified VTE: lovenox IVF: none Code: full    This is a Careers information officer Note.  The care of the patient was discussed with Dr. Beryle Beams and the assessment and plan formulated with their assistance.  Please see their attached note for official documentation of the daily encounter.   LOS: 1 day   Chukwu, Heide Spark I, Medical Student 06/02/2018, 10:55 AM

## 2018-06-03 ENCOUNTER — Inpatient Hospital Stay (HOSPITAL_COMMUNITY): Payer: Medicare Other

## 2018-06-03 DIAGNOSIS — E8809 Other disorders of plasma-protein metabolism, not elsewhere classified: Secondary | ICD-10-CM

## 2018-06-03 DIAGNOSIS — R899 Unspecified abnormal finding in specimens from other organs, systems and tissues: Secondary | ICD-10-CM

## 2018-06-03 LAB — CBC
HEMATOCRIT: 26.5 % — AB (ref 36.0–46.0)
HEMOGLOBIN: 8.5 g/dL — AB (ref 12.0–15.0)
MCH: 31.1 pg (ref 26.0–34.0)
MCHC: 32.1 g/dL (ref 30.0–36.0)
MCV: 97.1 fL (ref 80.0–100.0)
Platelets: 182 10*3/uL (ref 150–400)
RBC: 2.73 MIL/uL — AB (ref 3.87–5.11)
RDW: 17.6 % — ABNORMAL HIGH (ref 11.5–15.5)
WBC: 3.1 10*3/uL — AB (ref 4.0–10.5)
nRBC: 0 % (ref 0.0–0.2)

## 2018-06-03 LAB — GLUCOSE, CAPILLARY
GLUCOSE-CAPILLARY: 114 mg/dL — AB (ref 70–99)
GLUCOSE-CAPILLARY: 137 mg/dL — AB (ref 70–99)
GLUCOSE-CAPILLARY: 108 mg/dL — AB (ref 70–99)
Glucose-Capillary: 124 mg/dL — ABNORMAL HIGH (ref 70–99)
Glucose-Capillary: 111 mg/dL — ABNORMAL HIGH (ref 70–99)

## 2018-06-03 LAB — COMPREHENSIVE METABOLIC PANEL
ALBUMIN: 3.3 g/dL — AB (ref 3.5–5.0)
ALT: 14 U/L (ref 0–44)
ANION GAP: 5 (ref 5–15)
AST: 13 U/L — ABNORMAL LOW (ref 15–41)
Alkaline Phosphatase: 410 U/L — ABNORMAL HIGH (ref 38–126)
BILIRUBIN TOTAL: 0.4 mg/dL (ref 0.3–1.2)
BUN: 23 mg/dL — AB (ref 6–20)
CO2: 17 mmol/L — ABNORMAL LOW (ref 22–32)
CREATININE: 2.1 mg/dL — AB (ref 0.44–1.00)
Calcium: 7.9 mg/dL — ABNORMAL LOW (ref 8.9–10.3)
Chloride: 119 mmol/L — ABNORMAL HIGH (ref 98–111)
GFR calc Af Amer: 30 mL/min — ABNORMAL LOW (ref >60)
GFR calc non Af Amer: 26 mL/min — ABNORMAL LOW (ref >60)
GLUCOSE: 99 mg/dL (ref 70–99)
Potassium: 3.3 mmol/L — ABNORMAL LOW (ref 3.5–5.1)
Sodium: 141 mmol/L (ref 135–145)
TOTAL PROTEIN: 5.4 g/dL — AB (ref 6.5–8.1)

## 2018-06-03 LAB — ALDOLASE: Aldolase: 3.6 U/L (ref 3.3–10.3)

## 2018-06-03 LAB — MAGNESIUM: Magnesium: 1.7 mg/dL (ref 1.7–2.4)

## 2018-06-03 MED ORDER — MYCOPHENOLATE SODIUM 180 MG PO TBEC: 360.0000 mg | DELAYED_RELEASE_TABLET | Freq: Two times a day (BID) | ORAL | Status: DC

## 2018-06-03 MED ORDER — LABETALOL HCL 100 MG PO TABS: 100.0000 mg | ORAL_TABLET | Freq: Two times a day (BID) | ORAL | Status: DC

## 2018-06-03 MED ORDER — MYCOPHENOLATE SODIUM 180 MG PO TBEC
540.0000 mg | DELAYED_RELEASE_TABLET | Freq: Two times a day (BID) | ORAL | Status: DC
  Administered 2018-06-03 – 2018-06-04 (×2): 540 mg via ORAL
  Filled 2018-06-03 (×2): qty 3

## 2018-06-03 MED ORDER — POTASSIUM CHLORIDE CRYS ER 20 MEQ PO TBCR
20.0000 meq | EXTENDED_RELEASE_TABLET | ORAL | Status: DC
  Filled 2018-06-03: qty 1

## 2018-06-03 MED ORDER — POTASSIUM CHLORIDE 20 MEQ/15ML (10%) PO SOLN
40.0000 meq | ORAL | Status: DC
  Administered 2018-06-03: 40 meq via ORAL
  Filled 2018-06-03: qty 30

## 2018-06-03 MED ORDER — LABETALOL HCL 200 MG PO TABS: 200.0000 mg | ORAL_TABLET | Freq: Two times a day (BID) | ORAL | Status: DC

## 2018-06-03 MED ORDER — CLONIDINE HCL 0.2 MG PO TABS
0.2000 mg | ORAL_TABLET | Freq: Two times a day (BID) | ORAL | Status: DC
  Administered 2018-06-03 – 2018-06-04 (×2): 0.2 mg via ORAL
  Filled 2018-06-03 (×2): qty 1

## 2018-06-03 MED ORDER — POTASSIUM CHLORIDE CRYS ER 20 MEQ PO TBCR: 40.0000 meq | EXTENDED_RELEASE_TABLET | Freq: Three times a day (TID) | ORAL | Status: DC

## 2018-06-03 MED ORDER — POTASSIUM CHLORIDE 20 MEQ/15ML (10%) PO SOLN
20.0000 meq | ORAL | Status: AC
  Administered 2018-06-03 (×2): 20 meq via ORAL
  Filled 2018-06-03 (×2): qty 15

## 2018-06-03 MED ORDER — LABETALOL HCL 300 MG PO TABS
300.0000 mg | ORAL_TABLET | Freq: Two times a day (BID) | ORAL | Status: DC
  Administered 2018-06-03: 300 mg via ORAL
  Filled 2018-06-03 (×2): qty 1

## 2018-06-03 MED ORDER — POTASSIUM CHLORIDE CRYS ER 20 MEQ PO TBCR: 40.0000 meq | EXTENDED_RELEASE_TABLET | ORAL | Status: DC

## 2018-06-03 MED ORDER — CALCITRIOL 0.25 MCG PO CAPS
0.2500 ug | ORAL_CAPSULE | Freq: Every day | ORAL | Status: DC
  Administered 2018-06-04: 0.25 ug via ORAL
  Filled 2018-06-03: qty 1

## 2018-06-03 MED ORDER — TECHNETIUM TC 99M MEDRONATE IV KIT
20.0000 | PACK | Freq: Once | INTRAVENOUS | Status: AC | PRN
  Administered 2018-06-03: 21 via INTRAVENOUS

## 2018-06-03 NOTE — Consult Note (Signed)
[facility] Admit Date: 06/01/2018 06/03/2018 Rex[place] Agent Requesting Physician:  Beryle Beams  Reason for Consult: Hypokalemia, hypomagnesemia, metabolic acidosis, elevated alkaline phosphatase, status post kidney transplant  HPI:  52 year old female with history of deceased donor kidney transplant in 2011 at [facility] admitted on 11/25 with right-sided chest pain.  PMH Incudes:  Status post kidney transplant 2011, current immunosuppression of tacrolimus 4 mg twice daily, mycophenolate 720 mg twice daily, prednisone 5 mg daily.  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  Follows with Dr. Marval Regal in our office.  Baseline creatinine 1.8-2.1.  History of hypomagnesemia and some GI intolerance of magnesium supplementation; magnesium wasting presumably secondary to tacrolimus  History of hypokalemia  Chronic intermittent diarrhea  Post transplant secondary hyperparathyroidism with PTH values of 500-700 in the outpatient setting on calcitriol 3 times weekly; hypocalcemia  Hypertension on losartan 50 mg twice daily, labetalol 300 mg twice daily, amlodipine 10 mg daily  Diabetes mellitus type 2 post transplant on insulin  Work-up for patient's right-sided chest pain has been negative.  VQ scan was low probability for pulmonary embolus.  CT chest, abdomen and pelvis with faint centrilobular nodules throughout both lungs.  No other significant findings  Potassium was 2.2 upon presentation and increased to 3.3 today with repletion.  Magnesium has increased from 1.2-1.7.  Serum bicarbonate has increased to 17 from 14.  Patient has persistent elevated alkaline phosphatase between 360 and 470.  GGT is negative.  Other liver function studies are normal.  No subjective or documented fevers.  No dysuria.  Patient states that right-sided pleuritic pain has improved but is not resolved.  Creat (mg/dL)  Date Value  02/28/2014 1.43 (H)    Creatinine, Ser (mg/dL)  Date Value  06/03/2018 2.10 (H)  06/02/2018 2.16 (H)  06/01/2018 2.03 (H)  06/01/2018 2.26 (H)  11/08/2011 1.61 (H)  11/07/2011 1.87 (H)  11/06/2011 2.68 (H)  11/05/2011 3.79 (H)  11/05/2011 3.74 (H)  11/05/2011 3.88 (H)  ] ROS Balance of 12 systems is negative w/ exceptions as above  PMH  Past Medical History:  Diagnosis Date  . Blood transfusion without reported diagnosis   . 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 GERD (gastroesophageal reflux disease)    medication induced  . Hypertension   . 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 [place]  . CHOLECYSTECTOMY    . COLONOSCOPY  10 years ago   Dr.Hung="normal"  . DG AV DIALYSIS  SHUNT ACCESS EXIST*L* OR    . KIDNEY TRANSPLANT  2011  . UMBILICAL HERNIA REPAIR     FH  Family History  Problem Relation Age of Onset  . Hypertension Mother   . Diabetes Mother   . Atrial fibrillation Father   . Hypertension Father   . Arthritis Father   . Heart failure Sister   . Hypertension Sister        all 4 sisters  . Hypertension Brother        both brothers  . Diabetes Brother   . Colon cancer Neg Hx   . Stomach cancer Neg  Hx    SH  reports that she quit smoking about 6 months ago. Her smoking use included cigarettes. She smoked 0.50 packs per day. She has never used smokeless tobacco. She reports that she does not drink alcohol or use drugs. Allergies  Allergies  Allergen Reactions  . Infed [Iron Dextran] Other (See Comments)    Pt has chest pain  Chest pain  . Cefazolin Itching and Nausea And Vomiting  . Ancef [Cefazolin Sodium]  Other (See Comments)    Tolerated rocephin 11/03/10 Reports ancef causes itching, sweating, vomiting (x2-2009?)  . Lisinopril Swelling  . Other     Pt may be allergic to another antibiotic daughter isn't sure and pt unable to verify.   Home medications Prior to Admission medications   Medication Sig Start Date End Date Taking? Authorizing Provider  amLODipine (NORVASC) 10 MG tablet Take 10 mg by mouth daily.   Yes [provider]  atorvastatin (LIPITOR) 10 MG tablet Take 10 mg by mouth 3 (three) times a week. Taking on Monday, Wednesday, Friday.   Yes [provider]  calcitRIOL (ROCALTROL) 0.25 MCG capsule Take 0.25 mcg by mouth 3 (three) times a week. Monday, Wednesday, Friday. 04/14/18  Yes [provider]  Calcium-Phosphorus-Vitamin D (CITRACAL +D3 PO) Take 1 tablet by mouth. Mon, Wed, Friday nights only   Yes [provider]  cloNIDine (CATAPRES) 0.2 MG tablet Take 0.2 mg by mouth 3 (three) times daily. 02/02/17  Yes [provider]  famotidine (PEPCID) 20 MG tablet 40 mg daily with breakfast. 03/18/13  Yes [provider]  insulin aspart (NOVOLOG) 100 UNIT/ML injection Inject 8-14 Units into the skin as needed for high blood sugar.    Yes [provider]  labetalol (NORMODYNE) 300 MG tablet Take 300 mg by mouth 2 (two) times daily.    Yes [provider]  losartan (COZAAR) 50 MG tablet TAKE 2 TABLETS(100 MG) BY MOUTH DAILY Patient taking differently: Take 50 mg by mouth 2 (two) times daily.  10/06/17  Yes Annia Belt, MD  magnesium oxide (MAG-OX) 400 MG tablet Take 400 mg by mouth 2 (two) times daily.   Yes [provider]  mycophenolate (MYFORTIC) 180 MG EC tablet Take 720 mg by mouth.   Yes [provider]  predniSONE (DELTASONE) 2.5 MG tablet Take 2.5 mg by mouth daily. 05/22/18  Yes [provider]  PROGRAF 1 MG capsule Take 3 mg by mouth 2 (two) times daily.  05/15/17  Yes [provider]    Current Medications Scheduled Meds: . amLODipine  10 mg Oral Daily  . atorvastatin  10 mg Oral Once per day on Mon Wed Fri  . calcitRIOL  0.25 mcg Oral Once per day on Mon Wed Fri  . cloNIDine  0.2 mg Oral BID  . enoxaparin (LOVENOX) injection  40 mg Subcutaneous Q24H  . famotidine  20 mg Oral Daily  . insulin aspart  0-5 Units Subcutaneous QHS  . insulin aspart  0-9 Units Subcutaneous TID WC  . labetalol  300 mg Oral BID  . mycophenolate  720 mg Oral BID  . potassium chloride  20 mEq Oral Q4H  . predniSONE  2.5 mg Oral Daily  . sodium chloride flush  3 mL Intravenous Q12H  . tacrolimus  3 mg Oral BID   Continuous Infusions: PRN Meds:.acetaminophen **OR** acetaminophen, technetium medronate  CBC Recent Labs  Lab 06/01/18 0856 06/02/18 0701 06/03/18 0259  WBC 4.9 3.2* 3.1*  HGB  10.5* 8.5* 8.5*  HCT 31.8* 26.4* 26.5*  MCV 97.8 98.1 97.1  PLT 209 193 575   Basic Metabolic Panel Recent Labs  Lab 06/01/18 0856 06/01/18 2210 06/02/18 0701 06/03/18 0259  NA 144 142 141 141  K 2.3* 2.2* 3.2* 3.3*  CL 121* 120* 120* 119*  CO2 15* 14* 14* 17*  GLUCOSE 96 103* 106* 99  BUN 22* 22* 21* 23*  CREATININE 2.26* 2.03* 2.16* 2.10*  CALCIUM 7.2* 7.0* 7.1* 7.9*    Physical Exam  Blood pressure 113/77, pulse (!) 57, temperature (!) 97.5 F (36.4 C), temperature source Oral, resp. rate 16, height '5\' 11"'$  (1.803 m), weight 83 kg, last menstrual period 11/05/2012, SpO2 100 %. GEN: No acute distress, well-appearing, lying in bed ENT: NCAT EYES: EOMI CV: RRR, normal S1 and S2 PULM: Clear bilaterally, normal work of breathing ABD: Soft, nontender SKIN: No rashes or lesions EXT: Old vascular access in upper extremities, no lower extremity edema   Assessment 59F s/p ddKT 2011 on Tac/MMF/Pred presenting with R sided pleuritic CP, w/u negative; found to have chronic increased Alk Phos with normal GGT, hypoK, hypoMg, hypoCa, and 2HPTH  1. ddKT on Tac/MMF/Pred, BL  SCr 1.8-2.1 2. Inc Alk Phos, presumably from bone w/ nl GGT 3. Uncontrolled 2HPTH, recently started C3 0.88mg/d 4. Hypokalemia, chronic, usually low 3s as outpt 5. Hypomagnesemia, likley 2/2 tacrolimus, contributing to #4 6. Mild NAG Met Acidosis; probably 2/2 diarrhea to some degree 7. HTN, stable 8. R sided pleuritic pain  Plan 1. Very likely the alkaline phosphatase is driven by uncontrolled secondary hyperparathyroidism.  We should repeat PTH levels and increase calcitriol to 0.25 mcg daily.  Given hypocalcemia she is not a candidate for cinacalcet. 2. Continue aggressive repletion of magnesium and potassium. Try to keep Mg > 1.5 and K > 3. 3. Would not use sodium bicarbonate in the setting of chronic hypokalemia as this can exacerbate the problem 4. Reduce Myfortic to 540 twice daily to see if it helps with diarrhea 5. Daily weights, Daily Renal Panel, Strict I/Os, Avoid nephrotoxins (NSAIDs, judicious IV Contrast)    RPearson GrippeMD [phone] 06/03/2018, 1:52 PM

## 2018-06-03 NOTE — Progress Notes (Signed)
Subjective: [name] reports that the right-sided chest pain is not as tender to palpation thisAM, but the pain along the T6 dermatone remains unchanged.    Objective: Vital signs in last 24 hours: Vitals:   06/02/18 0944 06/02/18 0951 06/02/18 1532 06/02/18 2308  BP:  111/63 (!) 95/58 109/60  Pulse: 80 69 (!) 55 (!) 56  Resp:   16 14  Temp:   97.8 F (36.6 C) 97.8 F (36.6 C)  TempSrc:   Oral   SpO2:   100% 100%  Weight:      Height:       Weight change:   Intake/Output Summary (Last 24 hours) at 06/03/2018 0606 Last data filed at 06/02/2018 2000 Gross per 24 hour  Intake 357 ml  Output -  Net 357 ml   General: supine, in nad Cardio: RRR, HR to the 60s; nl S1, S2 Pulm: CTAB Abdominal: soft, non-distended or tender; without rebound or guarding  Micro Results: No results found for this or any previous visit (from the past 240 hour(s)). Studies/Results: Ct Abdomen Pelvis Wo Contrast  Result Date: 06/01/2018 CLINICAL DATA:  RIGHT-sided chest pain radiating to back for the last 2 days with shortness of breath. Cough and diarrhea for a month, abdominal pain. EXAM: CT CHEST, ABDOMEN AND PELVIS WITHOUT CONTRAST TECHNIQUE: Multidetector CT imaging of the chest, abdomen and pelvis was performed following the standard protocol without IV contrast. COMPARISON:  CT abdomen dated 03/05/2007. FINDINGS: CT CHEST FINDINGS Cardiovascular: No thoracic aortic aneurysm. Heart size is within normal limits. Small pericardial effusion. Coronary artery calcifications, particularly dense within the LEFT anterior descending coronary artery. Mediastinum/Nodes: No mass or enlarged lymph nodes seen within the mediastinum or perihilar regions. Esophagus appears normal. Trachea and central bronchi are unremarkable. Lungs/Pleura: Faint central lobular nodules throughout both lungs. No pleural effusion or pneumothorax seen. Musculoskeletal: No acute or suspicious osseous finding. Mild degenerative spurring  within the midthoracic spine. CT ABDOMEN PELVIS FINDINGS Hepatobiliary: No focal liver abnormality is seen. Status post cholecystectomy. No biliary dilatation. Pancreas: Unremarkable. No pancreatic ductal dilatation or surrounding inflammatory changes. Spleen: Normal in size without focal abnormality. Adrenals/Urinary Tract: Both kidneys are atrophic. Transplant kidney in the RIGHT kidney appears normal in size and cortical thickness. No hydronephrosis. No renal or ureteral calculi associated with the transplant kidney. Bladder appears normal. Stomach/Bowel: No dilated large or small bowel loops. No focal bowel wall thickening or evidence of bowel wall inflammation seen. Stomach is unremarkable, partially decompressed. Appendix is unremarkable. Vascular/Lymphatic: Aortic atherosclerosis. No enlarged abdominal or pelvic lymph nodes. Reproductive: Uterus and bilateral adnexa are unremarkable. Other: No free fluid or abscess collection. No free intraperitoneal air. Musculoskeletal: No acute or suspicious osseous finding. Degenerative spondylosis within the lower lumbar spine, moderate in degree. IMPRESSION: 1. Faint centrilobular nodules throughout both lungs. Differential includes infections such as TB, atypical mycobacterium, viral pneumonia and fungal pneumonia (including invasive aspergillosis), hypersensitivity pneumonitis, and respiratory bronchiolitis. 2. No acute findings within the abdomen or pelvis. No bowel obstruction or evidence of bowel wall inflammation. No free fluid or abscess. No evidence of acute solid organ abnormality. No renal or ureteral calculi. 3. Small pericardial effusion. 4. Coronary artery calcifications, particularly dense within the LEFT anterior descending coronary artery. Recommend correlation with any possible associated cardiac symptoms. Aortic Atherosclerosis (ICD10-I70.0). Electronically Signed   By: Franki Cabot M.D.   On: 06/01/2018 13:47   Dg Chest 2 View  Result Date:  06/01/2018 CLINICAL DATA:  Chest pain and RIGHT arm pain for  2 days worsened today, lightheaded, smoker, history diabetes mellitus, hypertension, renal transplant EXAM: CHEST - 2 VIEW COMPARISON:  11/03/2011 FINDINGS: Normal heart size, mediastinal contours, and pulmonary vascularity. Lungs clear. No pleural effusion or pneumothorax. Bones unremarkable. IMPRESSION: Normal exam. Electronically Signed   By: Lavonia Dana M.D.   On: 06/01/2018 09:26   Ct Chest Wo Contrast  Result Date: 06/01/2018 CLINICAL DATA:  RIGHT-sided chest pain radiating to back for the last 2 days with shortness of breath. Cough and diarrhea for a month, abdominal pain. EXAM: CT CHEST, ABDOMEN AND PELVIS WITHOUT CONTRAST TECHNIQUE: Multidetector CT imaging of the chest, abdomen and pelvis was performed following the standard protocol without IV contrast. COMPARISON:  CT abdomen dated 03/05/2007. FINDINGS: CT CHEST FINDINGS Cardiovascular: No thoracic aortic aneurysm. Heart size is within normal limits. Small pericardial effusion. Coronary artery calcifications, particularly dense within the LEFT anterior descending coronary artery. Mediastinum/Nodes: No mass or enlarged lymph nodes seen within the mediastinum or perihilar regions. Esophagus appears normal. Trachea and central bronchi are unremarkable. Lungs/Pleura: Faint central lobular nodules throughout both lungs. No pleural effusion or pneumothorax seen. Musculoskeletal: No acute or suspicious osseous finding. Mild degenerative spurring within the midthoracic spine. CT ABDOMEN PELVIS FINDINGS Hepatobiliary: No focal liver abnormality is seen. Status post cholecystectomy. No biliary dilatation. Pancreas: Unremarkable. No pancreatic ductal dilatation or surrounding inflammatory changes. Spleen: Normal in size without focal abnormality. Adrenals/Urinary Tract: Both kidneys are atrophic. Transplant kidney in the RIGHT kidney appears normal in size and cortical thickness. No hydronephrosis.  No renal or ureteral calculi associated with the transplant kidney. Bladder appears normal. Stomach/Bowel: No dilated large or small bowel loops. No focal bowel wall thickening or evidence of bowel wall inflammation seen. Stomach is unremarkable, partially decompressed. Appendix is unremarkable. Vascular/Lymphatic: Aortic atherosclerosis. No enlarged abdominal or pelvic lymph nodes. Reproductive: Uterus and bilateral adnexa are unremarkable. Other: No free fluid or abscess collection. No free intraperitoneal air. Musculoskeletal: No acute or suspicious osseous finding. Degenerative spondylosis within the lower lumbar spine, moderate in degree. IMPRESSION: 1. Faint centrilobular nodules throughout both lungs. Differential includes infections such as TB, atypical mycobacterium, viral pneumonia and fungal pneumonia (including invasive aspergillosis), hypersensitivity pneumonitis, and respiratory bronchiolitis. 2. No acute findings within the abdomen or pelvis. No bowel obstruction or evidence of bowel wall inflammation. No free fluid or abscess. No evidence of acute solid organ abnormality. No renal or ureteral calculi. 3. Small pericardial effusion. 4. Coronary artery calcifications, particularly dense within the LEFT anterior descending coronary artery. Recommend correlation with any possible associated cardiac symptoms. Aortic Atherosclerosis (ICD10-I70.0). Electronically Signed   By: Franki Cabot M.D.   On: 06/01/2018 13:47   Nm Pulmonary Vent And Perf (v/q Scan)  Result Date: 06/01/2018 CLINICAL DATA:  Chest wall pain, pleuritic RIGHT chest pain, symptoms for 3 days, shortness of breath for 1.5 days, cough, history of transplant kidney in worsening renal function, diabetes mellitus, hypertension, GERD, smoker EXAM: NUCLEAR MEDICINE VENTILATION - PERFUSION LUNG SCAN TECHNIQUE: Ventilation images were obtained in multiple projections using inhaled aerosol Tc-52mDTPA. Perfusion images were obtained in multiple  projections after intravenous injection of Tc-966mAA. RADIOPHARMACEUTICALS:  31.6 mCi of Tc-9967mPA aerosol inhalation and 4.2 mCi Tc99m69m IV COMPARISON:  None Correlation: Chest radiograph 06/01/2018 FINDINGS: Ventilation: Central airway deposition of aerosol. Swallowed aerosol within esophagus and stomach. Diminished ventilation RIGHT apex. Mild enlargement of cardiac silhouette. Perfusion: Mild enlargement of cardiac silhouette. Diminished though not absent perfusion throughout LEFT lower lobe. Remaining perfusion normal. Chest radiograph: Clear  lungs. IMPRESSION: Diminished ventilation RIGHT apex with normal perfusion. Diminished perfusion in LEFT lower lobe diffusely versus ventilation, though perfusion is seen extending to the periphery of the lung. By PIOPED II criteria, a solitary lobar mismatch represents a low probability for pulmonary embolism. Electronically Signed   By: Lavonia Dana M.D.   On: 06/01/2018 18:06   Medications:  Scheduled Meds: . amLODipine  10 mg Oral Daily  . atorvastatin  10 mg Oral Once per day on Mon Wed Fri  . calcitRIOL  0.25 mcg Oral Once per day on Mon Wed Fri  . calcium carbonate  1,000 mg of elemental calcium Oral BID  . cloNIDine  0.2 mg Oral TID  . enoxaparin (LOVENOX) injection  40 mg Subcutaneous Q24H  . famotidine  20 mg Oral Daily  . insulin aspart  0-5 Units Subcutaneous QHS  . insulin aspart  0-9 Units Subcutaneous TID WC  . labetalol  300 mg Oral BID  . mycophenolate  720 mg Oral BID  . predniSONE  2.5 mg Oral Daily  . sodium chloride flush  3 mL Intravenous Q12H  . tacrolimus  3 mg Oral BID   Continuous Infusions: PRN Meds:.acetaminophen **OR** acetaminophen   Assessment/Plan: Active Problems:   HTN (hypertension)   Type II diabetes mellitus, well controlled ([place])   S/p cadaver renal transplant   Pleuritic chest pain   Hypokalemia   Ms. Leavy is a 52 yo female with medical history significant for renal transplant (DDKT) in  2011,  HTN, and TIIDM, who presented with 3 day history of right sided pleuritic chest pain along the T6 dermatone and in the RUQ of breast that is worse with deep inspiration; and found to have electrolyte abnormalities on admission  suggestive of a non-anion gap metabolic acidosis but hemodynamically stable.   Pleuritic Chest Pain:  unchanged physical exam findings with no signs of systemic infection. Isolated Alk pho  elevated today at 410 (prev 365;  range 168-502; Avg of 338 from 15Nov 2016- 15Nov 2019). Aldolase and CK wnl  -  bone scan (>  24 hrs s/p V/Q scan - consider respiratory viral pathogen panel should clinical picture devolve to include hemodynamic instability and/or  more systemic signs and symptoms of infection.   Chronic Diarrhea, resolved (non-anion gap metabolic acidosis): Improved PO this am;  hypocalcemia in the setting of mild hypoalbuminemia, and with hypokalemia (potassium 3.3 today, s/p 60eq K over 24hrs). - f/u fecal fat stool study  - am Mg, BMP  - replete electrolytes as needed  S/p Renal Transplant (DDKT, 2011): Creatinine stable at 2.10 today, (prev 2.26 on admission) Baseline 1.8-2.1    - f/u Tac level; Unknown goal level.  Per Chart review [id number] FK Level goal: 5-8 - consulting nephrology, and appreciate any recommendations - continuing mycophenolate 720 mg bid  - cont. Tacrolimus 3 mg bid  - prednisone 2.'5mg'$  qd  Hypertension: Persistently hypotensive MAX24hr SBP 110s;  -  Held clonidine 26Nov PM; holding labetalol when HR <60.  - holding losartan '50mg'$  bid for concerns of aki -  continuing, norvasc 10 mg qd. -CTM    TIIDM - SSI sensitive    Diet:renal/carb modified IZT:[id number] IPJ:ASNK Code:full    This is a Careers information officer Note.  The care of the patient was discussed with Dr. Beryle Beams and the assessment and plan formulated with their assistance.  Please see their attached note for official documentation of the daily encounter.   LOS:  2 days   Chukwu, Chika I,  Medical Student 06/03/2018, 6:06 AM

## 2018-06-04 DIAGNOSIS — Z881 Allergy status to other antibiotic agents status: Secondary | ICD-10-CM

## 2018-06-04 DIAGNOSIS — N189 Chronic kidney disease, unspecified: Secondary | ICD-10-CM

## 2018-06-04 DIAGNOSIS — I129 Hypertensive chronic kidney disease with stage 1 through stage 4 chronic kidney disease, or unspecified chronic kidney disease: Secondary | ICD-10-CM

## 2018-06-04 DIAGNOSIS — I959 Hypotension, unspecified: Secondary | ICD-10-CM

## 2018-06-04 DIAGNOSIS — E1122 Type 2 diabetes mellitus with diabetic chronic kidney disease: Secondary | ICD-10-CM

## 2018-06-04 DIAGNOSIS — N2581 Secondary hyperparathyroidism of renal origin: Secondary | ICD-10-CM

## 2018-06-04 LAB — CBC
HCT: 26.9 % — ABNORMAL LOW (ref 36.0–46.0)
Hemoglobin: 8.6 g/dL — ABNORMAL LOW (ref 12.0–15.0)
MCH: 31.2 pg (ref 26.0–34.0)
MCHC: 32 g/dL (ref 30.0–36.0)
MCV: 97.5 fL (ref 80.0–100.0)
PLATELETS: 190 10*3/uL (ref 150–400)
RBC: 2.76 MIL/uL — AB (ref 3.87–5.11)
RDW: 17.6 % — ABNORMAL HIGH (ref 11.5–15.5)
WBC: 2.8 10*3/uL — ABNORMAL LOW (ref 4.0–10.5)
nRBC: 0 % (ref 0.0–0.2)

## 2018-06-04 LAB — PARATHYROID HORMONE, INTACT (NO CA): PTH: 440 pg/mL — ABNORMAL HIGH (ref 15–65)

## 2018-06-04 LAB — MAGNESIUM: MAGNESIUM: 1.6 mg/dL — AB (ref 1.7–2.4)

## 2018-06-04 LAB — COMPREHENSIVE METABOLIC PANEL
ALBUMIN: 3.1 g/dL — AB (ref 3.5–5.0)
ALK PHOS: 379 U/L — AB (ref 38–126)
ALT: 16 U/L (ref 0–44)
AST: 15 U/L (ref 15–41)
Anion gap: 5 (ref 5–15)
BILIRUBIN TOTAL: 0.5 mg/dL (ref 0.3–1.2)
BUN: 23 mg/dL — ABNORMAL HIGH (ref 6–20)
CALCIUM: 8.3 mg/dL — AB (ref 8.9–10.3)
CO2: 19 mmol/L — ABNORMAL LOW (ref 22–32)
Chloride: 117 mmol/L — ABNORMAL HIGH (ref 98–111)
Creatinine, Ser: 1.84 mg/dL — ABNORMAL HIGH (ref 0.44–1.00)
GFR calc non Af Amer: 31 mL/min — ABNORMAL LOW (ref >60)
GFR, EST AFRICAN AMERICAN: 35 mL/min — AB (ref >60)
GLUCOSE: 110 mg/dL — AB (ref 70–99)
Potassium: 3.3 mmol/L — ABNORMAL LOW (ref 3.5–5.1)
Sodium: 141 mmol/L (ref 135–145)
TOTAL PROTEIN: 5.4 g/dL — AB (ref 6.5–8.1)

## 2018-06-04 LAB — GLUCOSE, CAPILLARY
Glucose-Capillary: 111 mg/dL — ABNORMAL HIGH (ref 70–99)
Glucose-Capillary: 97 mg/dL (ref 70–99)

## 2018-06-04 MED ORDER — POTASSIUM CHLORIDE CRYS ER 20 MEQ PO TBCR: 10.0000 meq | EXTENDED_RELEASE_TABLET | Freq: Every day | ORAL | 0 refills | Status: DC

## 2018-06-04 MED ORDER — LABETALOL HCL 200 MG PO TABS: 200.0000 mg | ORAL_TABLET | Freq: Two times a day (BID) | ORAL | 2 refills | Status: DC

## 2018-06-04 MED ORDER — POTASSIUM CHLORIDE CRYS ER 20 MEQ PO TBCR
20.0000 meq | EXTENDED_RELEASE_TABLET | ORAL | Status: DC
  Administered 2018-06-04 (×2): 20 meq via ORAL
  Filled 2018-06-04 (×2): qty 1

## 2018-06-04 MED ORDER — CLONIDINE HCL 0.2 MG PO TABS: 0.2000 mg | ORAL_TABLET | Freq: Two times a day (BID) | ORAL | 6 refills | Status: DC

## 2018-06-04 MED ORDER — MYCOPHENOLATE SODIUM 180 MG PO TBEC: 540.0000 mg | DELAYED_RELEASE_TABLET | Freq: Two times a day (BID) | ORAL | Status: DC

## 2018-06-04 MED ORDER — MAGNESIUM SULFATE 2 GM/50ML IV SOLN
2.0000 g | INTRAVENOUS | Status: DC
  Administered 2018-06-04: 2 g via INTRAVENOUS
  Filled 2018-06-04 (×3): qty 50

## 2018-06-04 NOTE — Progress Notes (Signed)
Admit: 06/01/2018 LOS: 3  37F s/p ddKT 2011 on Tac/MMF/Pred presenting with R sided pleuritic CP, w/u negative; found to have chronic increased Alk Phos with normal GGT, hypoK, hypoMg, hypoCa, and 2HPTH  Subjective:  . Potassium 3.3, magnesium 1.6, bicarbonate 19, creatinine 1.84 this morning . PTH 440 with calcium of 8.3 . Patient feels well, right sided chest discomfort significantly improved . Blood pressures are stable . Bone scan without localization, diffuse uptake  11/27 0701 - 11/28 0700 In: 908 [P.O.:908] Out: -   Filed Weights   06/01/18 1819 06/03/18 0500  Weight: 83 kg 83 kg    Scheduled Meds: . amLODipine  10 mg Oral Daily  . atorvastatin  10 mg Oral Once per day on Mon Wed Fri  . calcitRIOL  0.25 mcg Oral Daily  . cloNIDine  0.2 mg Oral BID  . enoxaparin (LOVENOX) injection  40 mg Subcutaneous Q24H  . famotidine  20 mg Oral Daily  . insulin aspart  0-5 Units Subcutaneous QHS  . insulin aspart  0-9 Units Subcutaneous TID WC  . labetalol  300 mg Oral BID  . mycophenolate  540 mg Oral BID  . potassium chloride  20 mEq Oral Q2H  . predniSONE  2.5 mg Oral Daily  . sodium chloride flush  3 mL Intravenous Q12H  . tacrolimus  3 mg Oral BID   Continuous Infusions: . magnesium sulfate 1 - 4 g bolus IVPB 2 g (06/04/18 0823)   PRN Meds:.acetaminophen **OR** acetaminophen  Current Labs: reviewed  Results for [name], [name] (MRN [id number]) as of 06/04/2018 11:12  Ref. Range 06/03/2018 14:44  PTH, Intact Latest Ref Range: 15 - 65 pg/mL 440 (H)    Physical Exam:  Blood pressure 112/75, pulse (!) 59, temperature 98 F (36.7 C), temperature source Oral, resp. rate 15, height _0  (1.803 m), weight 83 kg, last menstrual period 11/05/2012, SpO2 100 %. GEN: No acute distress, well-appearing, lying in bed ENT: NCAT EYES: EOMI CV: RRR, normal S1 and S2 PULM: Clear bilaterally, normal work of breathing ABD: Soft, nontender SKIN: No rashes or lesions EXT: Old  vascular access in upper extremities, no lower extremity edema  A 1. ddKT on Tac/MMF/Pred, BL SCr 1.8-2.1 2. 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 Uncontrolled 2HPTH, recently started C3 0.5mg TIW 4. Hypokalemia, chronic, usually low 3s as outpt 5. Hypomagnesemia, likley 2/2 tacrolimus, contributing to #4 6. Mild NAG Met Acidosis; probably 2/2 diarrhea to some degree 7. HTN, stable 8. R sided pleuritic pain  P . Continue daily calcitriol, we will titrate as an outpatient to suppress PTH; and will also consider use of cinacalcet . Would continue reduced dose of Myfortic 540 mg twice daily . No further suggestions, will make sure has outpt f/u with Dr. CMarval Regal. Will sign off for now   RPearson GrippeMD 06/04/2018, 11:12 AM  Recent Labs  Lab 06/02/18 0701 06/03/18 0259 06/04/18 0403  NA 141 141 141  K 3.2* 3.3* 3.3*  CL 120* 119* 117*  CO2 14* 17* 19*  GLUCOSE 106* 99 110*  BUN 21* 23* 23*  CREATININE 2.16* 2.10* 1.84*  CALCIUM 7.1* 7.9* 8.3*   Recent Labs  Lab 06/02/18 0701 06/03/18 0259 06/04/18 0403  WBC 3.2* 3.1* 2.8*  HGB 8.5* 8.5* 8.6*  HCT 26.4* 26.5* 26.9*  MCV 98.1 97.1 97.5  PLT 193 182 190

## 2018-06-04 NOTE — Progress Notes (Signed)
Patient given discharge instructions. Patient educated on medications and follow up appts. Patient verbalized understanding.

## 2018-06-04 NOTE — Progress Notes (Signed)
Internal Medicine Attending:   I saw and examined the patient. I reviewed the resident's note and I agree with the resident's findings and plan as documented in the resident's note.  Patient feels better today with no new complaints.  She still has some mild right-sided chest pain but this is improving.  Patient was admitted to the hospital with right-sided pleuritic chest pain likely musculoskeletal nature.  Patient had a nuclear medicine bone scan done yesterday which was consistent with renal osteodystrophy likely secondary to her CKD.  We will continue Tylenol as needed for her pain.  Renal follow-up and recommendations appreciated.  Patient with mild hypomagnesemia likely secondary to her mycophenolate.  Mycophenolate decreased from 4 times daily to 3 times daily per nephrology.  Will supplement mag for now.  No further work-up at this time.  Patient is stable for discharge home today.

## 2018-06-04 NOTE — Progress Notes (Signed)
   Subjective:  She states she is feeling better today and he right sided chest pain has decreased, especially in the anterior area. She has had a bm but no diarrhea. No dizziness, nausea  Objective:  Vital signs in last 24 hours: Vitals:   06/03/18 0907 06/03/18 1742 06/03/18 2145 06/03/18 2354  BP:  125/69 130/61 (!) 112/51  Pulse: (!) 57 68  61  Resp:    15  Temp:  98.1 F (36.7 C)  98.4 F (36.9 C)  TempSrc:  Oral  Oral  SpO2:  99%  98%  Weight:      Height:       Physical Exam Constitution: NAD, sitting up in bed Cardio: bradycardic, regular rhythm, no m/r/g Respiratory: CTAB, decreased breath sounds, no w/r/r Abdominal: NTTP, soft, non-distended MSK: TTP area of ribs 10-12 right mid axillary, nontender anterior ribs   Assessment/Plan:  Active Problems:   HTN (hypertension)   Type II diabetes mellitus, well controlled ([facility])   S/p cadaver renal transplant   Pleuritic chest pain   Hypokalemia   Abnormal laboratory test  52 yo female with PMH of renal transplant 2011 2/2 HTN, and TIIDM presenting today with right sided chest pain that is worse with deep inspiration, shortness of breath, and lightheadedness that began about three days ago.  Pleuritic Chest Pain Pain is resolving. Bone scan yesterday showed diffuse uptake significant for possible "super scan," most likely indicating osteodystrophy secondary to her CKD. While exact etiology of her chest pain is unclear, it is likely MSK in origin or 2/2 to osteomalacia as her hyperparathyroidism appears to be worsening with electrolyte balances. We discussed incentive spirometer use as she has not been moving around much or having deep inspiration 2/2 pain. She has used this before and agrees.   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 Bone scan yesterday showed diffuse uptake yesterday, consistent  with hyperparathyroidism and osteodystrophy 2/2 to her CKD.   - Potassium chloride 20 mEq x 4 - Mg 4 g IV   Chronic Diarrhea Likely 2/2 mycophenylate and magnesium. Mg d/ced at admission and recently decreased in the outpatient setting. Will restart at discharge. Mycophenilate decreased to from qid to tid per nephro. Educated patient on Mg rich diet.   Hypertension Persistently soft bp and bradycardia. Decreased clonidine from tid to bid yesterday.   - decrease labetalol to 200 mg bid.   VTE: lovenox IVF: none Diet: renal/carb modified Code: full  Dispo: Anticipated discharge in approximately today.   Molli Hazard A, DO 06/04/2018, 7:01 AM Pager: [phone]

## 2018-06-05 ENCOUNTER — Other Ambulatory Visit: Payer: Self-pay | Admitting: Internal Medicine

## 2018-06-06 LAB — VITAMIN D 25 HYDROXY (VIT D DEFICIENCY, FRACTURES): VIT D 25 HYDROXY: 4.2 ng/mL — AB (ref 30.0–100.0)

## 2018-06-06 LAB — TACROLIMUS LEVEL: TACROLIMUS (FK506) - LABCORP: 8 ng/mL (ref 2.0–20.0)

## 2018-06-08 ENCOUNTER — Telehealth: Payer: Self-pay

## 2018-06-08 LAB — PTH, INTACT AND CALCIUM
Calcium, Total (PTH): 8.5 mg/dL — ABNORMAL LOW (ref 8.7–10.2)
PTH: 324 pg/mL — AB (ref 15–65)

## 2018-06-08 NOTE — Telephone Encounter (Signed)
Hospital TOC per dr Sharon Seller, appt 06/11/2018.

## 2018-06-11 ENCOUNTER — Ambulatory Visit (INDEPENDENT_AMBULATORY_CARE_PROVIDER_SITE_OTHER): Payer: Medicare Other | Admitting: Internal Medicine

## 2018-06-11 ENCOUNTER — Other Ambulatory Visit: Payer: Self-pay

## 2018-06-11 VITALS — BP 150/70 | HR 88 | Temp 98.1°F | Ht 71.0 in | Wt 184.3 lb

## 2018-06-11 DIAGNOSIS — I959 Hypotension, unspecified: Secondary | ICD-10-CM | POA: Insufficient documentation

## 2018-06-11 DIAGNOSIS — N2581 Secondary hyperparathyroidism of renal origin: Secondary | ICD-10-CM | POA: Insufficient documentation

## 2018-06-11 DIAGNOSIS — R0781 Pleurodynia: Secondary | ICD-10-CM

## 2018-06-11 DIAGNOSIS — E208 Other hypoparathyroidism: Secondary | ICD-10-CM | POA: Diagnosis not present

## 2018-06-11 DIAGNOSIS — I10 Essential (primary) hypertension: Secondary | ICD-10-CM | POA: Diagnosis not present

## 2018-06-11 NOTE — Progress Notes (Signed)
Internal Medicine Clinic Attending  I saw and evaluated the patient.  I personally confirmed the key portions of the history and exam documented by Dr.  Rehman  and I reviewed pertinent patient test results.  The assessment, diagnosis, and plan were formulated together and I agree with the documentation in the resident's note.  

## 2018-06-11 NOTE — Assessment & Plan Note (Signed)
Patient was found to be hypokalemic, hypomanic easement, hypophosphatemic and hypocalcemic during her hospital admission.  Lyte abnormalities were likely worsened secondary to chronic kidney disease status post renal transplant for hypertension in 2011.  Patient takes 0.25 MCG of Calcitrol and this may need to be increased.  PTH level is increased at 324 vitamin D level 4.2.  We will check her electrolyte levels today and recommend close follow-up with Dr. Marval Regal.  -f/u bmp -f/u Mg -close follow up with Dr. Marval Regal on 12/13 at 11:15 am

## 2018-06-11 NOTE — Assessment & Plan Note (Signed)
Patient was recently admitted to the hospital from 11/25 to 11/28 due to pleuritic chest pain. No specific etiology found but likely secondary to MSK vs. Osteomalacia from CKD and hyperparathyroidism. CT did show non-specific centrilobular nodules but infectious cause found to be unlikely. Cardiac work-up was also negative. Bone scan showed diffuse uptake significant for "super scan," likely related to osteodystrophy or osteomalacia from hyperparathyroidism 2/2 CKD. Her symptoms resolved on their own during hospital stay and may possibly be MSK in origin as well.   Today, she reported she is still having right sided tenderness under her right breast and on her right side.  She reported that the substernal chest pain has resolved.  She feels that the pain is worse with movement and deep inspirations.  She takes Tylenol as needed when the pain is severe.  We recommend that she try a topical cream such as capsaicin or icy hot as well as some heat therapy to see if that helps.  -Start topical cream such as capsaicin or icy hot -Try heat therapy

## 2018-06-11 NOTE — Patient Instructions (Addendum)
Ms. Obi,  It was a pleasure meeting you today! I am sorry to hear you are still having pain. We recommend you try some topical therapies like capsaicin or icy hot or even heat therapy to see if that will help.   We also want you to closely follow up with Dr. Marval Regal on 12/13 at 11:15 am.

## 2018-06-11 NOTE — Assessment & Plan Note (Signed)
Patient was found to be hypomagnesemic in her hospital admission.  She previously had low magnesium due to chronic diarrhea.  She was discharged with plans to continue her magnesium supplement as her tacrolimus medication will continue to cause decreased magnesium.  She reported she has been taking 400 mg twice daily.  -f/u mg

## 2018-06-11 NOTE — Progress Notes (Signed)
Thank you :)

## 2018-06-11 NOTE — Progress Notes (Signed)
   CC: pleuritic chest pain  HPI:  [name] is a 52 y.o. with a PMHx listed below presenting today for a hospital follow up appointment regarding her pleuritic chest pain, electrolyte abnormalities and hypoparathyroidism 2/2 CKD.   For details of today's visit and the status of his chronic medical issues please refer to the assessment and plan.   Past Medical History:  Diagnosis Date  . Blood transfusion without reported diagnosis   . 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 GERD (gastroesophageal reflux disease)    medication induced  . Hypertension   . 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   Cardiovascular: Negative for chest pain, palpitations and leg swelling.  Musculoskeletal: Positive for myalgias.     Physical Exam:  Vitals:   06/11/18 0958  BP: (!) 150/70  Pulse: 88  Temp: 98.1 F (36.7 C)  TempSrc: Oral  SpO2: 100%  Weight: 184 lb 4.8 oz (83.6 kg)  Height: 5\' 11"  (1.803 m)   Physical Exam  Constitutional: She is oriented to person, place, and time and well-developed, well-nourished, and in no distress.  Cardiovascular: Normal rate, regular rhythm and normal heart sounds.  No murmur heard. Pulmonary/Chest: Effort normal and breath sounds normal. No respiratory distress. She has no wheezes. She has no rales.  Abdominal: Soft. Bowel sounds are normal. She exhibits no distension. There is no tenderness. There is no guarding.  Musculoskeletal: She exhibits tenderness. She exhibits no edema.  Tenderness under right breast and on right side to palpation    Neurological: She is alert and oriented to person, place, and time.  Skin: Skin is warm and dry. No erythema.  Psychiatric: Mood, memory, affect and judgment normal.    Assessment & Plan:   See Encounters Tab for problem based charting.  Patient seen with Dr. Dareen Piano

## 2018-06-11 NOTE — Assessment & Plan Note (Signed)
BP 150/70 today. History of hypertension, which has likely decreased over time with intentional weight loss over the past two years. She was consistently bradycardic with soft BP throughout recent hospital admission. She was discharged with decrease in labetalol from 300 mg bid to 200 mg bid and decrease in clonidine .2mg  tid to bid. Will continue to monitor her BP on these lower doses.

## 2018-06-11 NOTE — Addendum Note (Signed)
Addended by: Aldine Contes on: 06/11/2018 02:57 PM   Modules accepted: Level of Service

## 2018-06-12 LAB — BMP8+ANION GAP
ANION GAP: 16 mmol/L (ref 10.0–18.0)
BUN/Creatinine Ratio: 10 (ref 9–23)
BUN: 17 mg/dL (ref 6–24)
CO2: 16 mmol/L — ABNORMAL LOW (ref 20–29)
Calcium: 8.9 mg/dL (ref 8.7–10.2)
Chloride: 110 mmol/L — ABNORMAL HIGH (ref 96–106)
Creatinine, Ser: 1.78 mg/dL — ABNORMAL HIGH (ref 0.57–1.00)
GFR calc Af Amer: 37 mL/min/{1.73_m2} — ABNORMAL LOW (ref >59)
GFR calc non Af Amer: 32 mL/min/{1.73_m2} — ABNORMAL LOW (ref >59)
Glucose: 85 mg/dL (ref 65–99)
Potassium: 3.8 mmol/L (ref 3.5–5.2)
Sodium: 142 mmol/L (ref 134–144)

## 2018-06-12 LAB — MAGNESIUM: Magnesium: 1.7 mg/dL (ref 1.6–2.3)

## 2018-06-16 NOTE — Telephone Encounter (Signed)
Appt completed 06/11/2018

## 2018-06-25 DIAGNOSIS — Z940 Kidney transplant status: Secondary | ICD-10-CM | POA: Diagnosis not present

## 2018-06-25 DIAGNOSIS — Z720 Tobacco use: Secondary | ICD-10-CM | POA: Diagnosis not present

## 2018-06-25 DIAGNOSIS — R809 Proteinuria, unspecified: Secondary | ICD-10-CM | POA: Diagnosis not present

## 2018-06-25 DIAGNOSIS — Z79899 Other long term (current) drug therapy: Secondary | ICD-10-CM | POA: Diagnosis not present

## 2018-06-25 DIAGNOSIS — N2581 Secondary hyperparathyroidism of renal origin: Secondary | ICD-10-CM | POA: Diagnosis not present

## 2018-06-25 DIAGNOSIS — N189 Chronic kidney disease, unspecified: Secondary | ICD-10-CM | POA: Diagnosis not present

## 2018-06-25 DIAGNOSIS — I129 Hypertensive chronic kidney disease with stage 1 through stage 4 chronic kidney disease, or unspecified chronic kidney disease: Secondary | ICD-10-CM | POA: Diagnosis not present

## 2018-06-25 DIAGNOSIS — N183 Chronic kidney disease, stage 3 (moderate): Secondary | ICD-10-CM | POA: Diagnosis not present

## 2018-06-25 DIAGNOSIS — D631 Anemia in chronic kidney disease: Secondary | ICD-10-CM | POA: Diagnosis not present

## 2018-06-25 DIAGNOSIS — I959 Hypotension, unspecified: Secondary | ICD-10-CM | POA: Diagnosis not present

## 2018-06-25 DIAGNOSIS — E669 Obesity, unspecified: Secondary | ICD-10-CM | POA: Diagnosis not present

## 2018-06-25 DIAGNOSIS — E118 Type 2 diabetes mellitus with unspecified complications: Secondary | ICD-10-CM | POA: Diagnosis not present

## 2018-06-25 DIAGNOSIS — E785 Hyperlipidemia, unspecified: Secondary | ICD-10-CM | POA: Diagnosis not present

## 2018-07-03 DIAGNOSIS — I129 Hypertensive chronic kidney disease with stage 1 through stage 4 chronic kidney disease, or unspecified chronic kidney disease: Secondary | ICD-10-CM | POA: Diagnosis not present

## 2018-07-27 DIAGNOSIS — N189 Chronic kidney disease, unspecified: Secondary | ICD-10-CM | POA: Diagnosis not present

## 2018-07-27 DIAGNOSIS — Z940 Kidney transplant status: Secondary | ICD-10-CM | POA: Diagnosis not present

## 2018-07-27 DIAGNOSIS — N183 Chronic kidney disease, stage 3 (moderate): Secondary | ICD-10-CM | POA: Diagnosis not present

## 2018-07-27 DIAGNOSIS — E118 Type 2 diabetes mellitus with unspecified complications: Secondary | ICD-10-CM | POA: Diagnosis not present

## 2018-07-27 DIAGNOSIS — E785 Hyperlipidemia, unspecified: Secondary | ICD-10-CM | POA: Diagnosis not present

## 2018-08-05 DIAGNOSIS — I129 Hypertensive chronic kidney disease with stage 1 through stage 4 chronic kidney disease, or unspecified chronic kidney disease: Secondary | ICD-10-CM | POA: Diagnosis not present

## 2018-08-05 DIAGNOSIS — E118 Type 2 diabetes mellitus with unspecified complications: Secondary | ICD-10-CM | POA: Diagnosis not present

## 2018-08-05 DIAGNOSIS — E785 Hyperlipidemia, unspecified: Secondary | ICD-10-CM | POA: Diagnosis not present

## 2018-08-05 DIAGNOSIS — T829XXA Unspecified complication of cardiac and vascular prosthetic device, implant and graft, initial encounter: Secondary | ICD-10-CM | POA: Diagnosis not present

## 2018-08-05 DIAGNOSIS — N183 Chronic kidney disease, stage 3 (moderate): Secondary | ICD-10-CM | POA: Diagnosis not present

## 2018-08-05 DIAGNOSIS — Z940 Kidney transplant status: Secondary | ICD-10-CM | POA: Diagnosis not present

## 2018-08-05 DIAGNOSIS — R809 Proteinuria, unspecified: Secondary | ICD-10-CM | POA: Diagnosis not present

## 2018-08-05 DIAGNOSIS — D631 Anemia in chronic kidney disease: Secondary | ICD-10-CM | POA: Diagnosis not present

## 2018-08-05 DIAGNOSIS — Z79899 Other long term (current) drug therapy: Secondary | ICD-10-CM | POA: Diagnosis not present

## 2018-08-05 DIAGNOSIS — I959 Hypotension, unspecified: Secondary | ICD-10-CM | POA: Diagnosis not present

## 2018-08-05 DIAGNOSIS — N2581 Secondary hyperparathyroidism of renal origin: Secondary | ICD-10-CM | POA: Diagnosis not present

## 2018-09-14 DIAGNOSIS — Z794 Long term (current) use of insulin: Secondary | ICD-10-CM | POA: Diagnosis not present

## 2018-09-14 DIAGNOSIS — E139 Other specified diabetes mellitus without complications: Secondary | ICD-10-CM | POA: Diagnosis not present

## 2018-09-14 DIAGNOSIS — Z720 Tobacco use: Secondary | ICD-10-CM | POA: Diagnosis not present

## 2018-09-28 DIAGNOSIS — Z940 Kidney transplant status: Secondary | ICD-10-CM | POA: Diagnosis not present

## 2018-09-28 DIAGNOSIS — N189 Chronic kidney disease, unspecified: Secondary | ICD-10-CM | POA: Diagnosis not present

## 2018-09-29 ENCOUNTER — Ambulatory Visit: Payer: Medicare Other | Admitting: Physician Assistant

## 2018-10-07 DIAGNOSIS — E785 Hyperlipidemia, unspecified: Secondary | ICD-10-CM | POA: Diagnosis not present

## 2018-10-07 DIAGNOSIS — I129 Hypertensive chronic kidney disease with stage 1 through stage 4 chronic kidney disease, or unspecified chronic kidney disease: Secondary | ICD-10-CM | POA: Diagnosis not present

## 2018-10-07 DIAGNOSIS — Z940 Kidney transplant status: Secondary | ICD-10-CM | POA: Diagnosis not present

## 2018-10-07 DIAGNOSIS — I959 Hypotension, unspecified: Secondary | ICD-10-CM | POA: Diagnosis not present

## 2018-10-07 DIAGNOSIS — E118 Type 2 diabetes mellitus with unspecified complications: Secondary | ICD-10-CM | POA: Diagnosis not present

## 2018-10-07 DIAGNOSIS — R809 Proteinuria, unspecified: Secondary | ICD-10-CM | POA: Diagnosis not present

## 2018-10-07 DIAGNOSIS — D631 Anemia in chronic kidney disease: Secondary | ICD-10-CM | POA: Diagnosis not present

## 2018-10-07 DIAGNOSIS — N2581 Secondary hyperparathyroidism of renal origin: Secondary | ICD-10-CM | POA: Diagnosis not present

## 2018-10-07 DIAGNOSIS — N183 Chronic kidney disease, stage 3 (moderate): Secondary | ICD-10-CM | POA: Diagnosis not present

## 2018-10-07 DIAGNOSIS — T829XXA Unspecified complication of cardiac and vascular prosthetic device, implant and graft, initial encounter: Secondary | ICD-10-CM | POA: Diagnosis not present

## 2018-10-07 DIAGNOSIS — Z79899 Other long term (current) drug therapy: Secondary | ICD-10-CM | POA: Diagnosis not present

## 2018-12-02 DIAGNOSIS — E118 Type 2 diabetes mellitus with unspecified complications: Secondary | ICD-10-CM | POA: Diagnosis not present

## 2018-12-02 DIAGNOSIS — Z940 Kidney transplant status: Secondary | ICD-10-CM | POA: Diagnosis not present

## 2018-12-02 DIAGNOSIS — E785 Hyperlipidemia, unspecified: Secondary | ICD-10-CM | POA: Diagnosis not present

## 2018-12-02 DIAGNOSIS — N189 Chronic kidney disease, unspecified: Secondary | ICD-10-CM | POA: Diagnosis not present

## 2018-12-09 DIAGNOSIS — D631 Anemia in chronic kidney disease: Secondary | ICD-10-CM | POA: Diagnosis not present

## 2018-12-09 DIAGNOSIS — N2581 Secondary hyperparathyroidism of renal origin: Secondary | ICD-10-CM | POA: Diagnosis not present

## 2018-12-09 DIAGNOSIS — N183 Chronic kidney disease, stage 3 (moderate): Secondary | ICD-10-CM | POA: Diagnosis not present

## 2018-12-09 DIAGNOSIS — I959 Hypotension, unspecified: Secondary | ICD-10-CM | POA: Diagnosis not present

## 2018-12-09 DIAGNOSIS — R809 Proteinuria, unspecified: Secondary | ICD-10-CM | POA: Diagnosis not present

## 2018-12-09 DIAGNOSIS — Z940 Kidney transplant status: Secondary | ICD-10-CM | POA: Diagnosis not present

## 2018-12-09 DIAGNOSIS — Z79899 Other long term (current) drug therapy: Secondary | ICD-10-CM | POA: Diagnosis not present

## 2018-12-09 DIAGNOSIS — E785 Hyperlipidemia, unspecified: Secondary | ICD-10-CM | POA: Diagnosis not present

## 2018-12-09 DIAGNOSIS — T829XXA Unspecified complication of cardiac and vascular prosthetic device, implant and graft, initial encounter: Secondary | ICD-10-CM | POA: Diagnosis not present

## 2018-12-09 DIAGNOSIS — E118 Type 2 diabetes mellitus with unspecified complications: Secondary | ICD-10-CM | POA: Diagnosis not present

## 2018-12-09 DIAGNOSIS — I129 Hypertensive chronic kidney disease with stage 1 through stage 4 chronic kidney disease, or unspecified chronic kidney disease: Secondary | ICD-10-CM | POA: Diagnosis not present

## 2019-01-27 ENCOUNTER — Ambulatory Visit (INDEPENDENT_AMBULATORY_CARE_PROVIDER_SITE_OTHER): Payer: Medicare Other | Admitting: Internal Medicine

## 2019-01-27 ENCOUNTER — Other Ambulatory Visit: Payer: Self-pay

## 2019-01-27 ENCOUNTER — Encounter: Payer: Self-pay | Admitting: Internal Medicine

## 2019-01-27 VITALS — BP 148/88 | HR 65 | Temp 98.3°F | Ht 71.0 in | Wt 180.7 lb

## 2019-01-27 DIAGNOSIS — I10 Essential (primary) hypertension: Secondary | ICD-10-CM

## 2019-01-27 DIAGNOSIS — Z0000 Encounter for general adult medical examination without abnormal findings: Secondary | ICD-10-CM

## 2019-01-27 DIAGNOSIS — E119 Type 2 diabetes mellitus without complications: Secondary | ICD-10-CM | POA: Diagnosis not present

## 2019-01-27 DIAGNOSIS — Z940 Kidney transplant status: Secondary | ICD-10-CM | POA: Diagnosis not present

## 2019-01-27 DIAGNOSIS — D638 Anemia in other chronic diseases classified elsewhere: Secondary | ICD-10-CM | POA: Diagnosis not present

## 2019-01-27 DIAGNOSIS — K219 Gastro-esophageal reflux disease without esophagitis: Secondary | ICD-10-CM | POA: Diagnosis not present

## 2019-01-27 DIAGNOSIS — E208 Other hypoparathyroidism: Secondary | ICD-10-CM

## 2019-01-27 DIAGNOSIS — E2089 Other specified hypoparathyroidism: Secondary | ICD-10-CM

## 2019-01-27 LAB — POCT GLYCOSYLATED HEMOGLOBIN (HGB A1C): Hemoglobin A1C: 4.6 % (ref 4.0–5.6)

## 2019-01-27 LAB — GLUCOSE, CAPILLARY: Glucose-Capillary: 73 mg/dL (ref 70–99)

## 2019-01-27 NOTE — Assessment & Plan Note (Signed)
-   due for mammogram - she is going to have one next week.

## 2019-01-27 NOTE — Assessment & Plan Note (Signed)
Her diabetes was secondary to her kidney disease and is now in remission. She is not requiring insulin but still checks her glucose which is usually around 80-100 and is not typically elevated anymore. Glucose is 70 this morning and she was given some graham crackers. She has not had any symptoms of hypoglycemia. She hadn't eaten yet today and states it usually does not go this low.   - does not need foot exam or ophthalmology exam  - continue to check glucose intermittently - Hgb a1c 4.1 and does not need three month checks for now.

## 2019-01-27 NOTE — Patient Instructions (Addendum)
Thank you for allowing [place] to provide your care today. Today we discussed your hypertension, chronic kidney disease.     I have ordered the following labs for you:  Basic metabolic panel, Complete blood count, iron, ferritin, and screening virus panel.    I will call if any are abnormal.    Today we made the following changes to your medications:   Please follow-up in one year.    Should you have any questions or concerns please call the internal medicine clinic at [phone].

## 2019-01-27 NOTE — Assessment & Plan Note (Signed)
Follows with Dr. Amedeo Gory. Stable and chronic. On mycophenolate and tacrolimus. History of hypomagnesemia on mag supplementation. Will see Dr. Amedeo Gory again in September. She is still taking magnesium and potassium. She is supposed to follow-up with [facility] on Monday for labs. Discussed with them and will order labs now and we were going to draw labs anyway as well.   - BK virus, EBV, CMV quant PCR  - RFP - Mg - f/u with Dr. Amedeo Gory in September

## 2019-01-27 NOTE — Assessment & Plan Note (Signed)
BP Readings from Last 3 Encounters:  01/27/19 (!) 148/88  06/11/18 (!) 150/70  06/04/18 112/75   She is taking amlodipine 10 mg qd again per nephrology and continues labetalol 200 mg bid. Her clonidine was stopped due to hypotension. She hasn't taken her medication this morning. She states her blood pressure is usually around 016 systolic at home although she can't remember the bottom number.   - continue to check blood pressure at home - BMP today

## 2019-01-27 NOTE — Progress Notes (Signed)
   CC: blood pressure check, hemoglobin a1c  HPI:  [name] is a 53 y.o. with PMH as below.   Please see A&P for assessment of the patient's acute and chronic medical conditions.    Past Medical History:  Diagnosis Date  . Blood transfusion without reported diagnosis   . 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 GERD (gastroesophageal reflux disease)    medication induced  . Hypertension   . 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  HENT: Positive for congestion. Negative for sore throat.   Eyes: Negative for blurred vision.  Respiratory: Negative for cough and shortness of breath.   Cardiovascular: Negative for chest pain, palpitations and leg swelling.  Gastrointestinal: Negative for abdominal pain, nausea and vomiting.  Genitourinary: Negative for dysuria, flank pain, frequency, hematuria and urgency.  Neurological: Negative for dizziness and tingling.   Physical Exam:  Constitution: NAD, appears stated age Cardio: RRR, no m/r/g, no LE edema  Respiratory: CTA, no wheezing rales, rhonchi  Abdominal: NTTP, soft, non-distended Skin: c/d/i   Vitals:   01/27/19 0836 01/27/19 0843  BP: (!) 161/91 (!) 148/88  Pulse: 62 65  Temp: 98.3 F (36.8 C)   TempSrc: Oral   SpO2: 100%   Weight: 180 lb 11.2 oz (82 kg)   Height: 5\' 11"  (1.803 m)      Assessment & Plan:   See Encounters Tab for problem based charting.  Patient discussed with Dr. Angelia Mould

## 2019-01-27 NOTE — Assessment & Plan Note (Signed)
Stopped pepcid as medicaid would no longer pay for it. She is now taking omeprazole 20mg  qd and symptoms of GERD are controlled.

## 2019-01-27 NOTE — Assessment & Plan Note (Signed)
Chronic and stable, followed by nephrology. Nephro Increased calcitriol .68mcg two times per day now. She was having right sided chest pain prior to this that has resolved.

## 2019-01-29 ENCOUNTER — Encounter: Payer: Self-pay | Admitting: *Deleted

## 2019-01-29 LAB — CBC
Hematocrit: 32.1 % — ABNORMAL LOW (ref 34.0–46.6)
Hemoglobin: 11 g/dL — ABNORMAL LOW (ref 11.1–15.9)
MCH: 33.2 pg — ABNORMAL HIGH (ref 26.6–33.0)
MCHC: 34.3 g/dL (ref 31.5–35.7)
MCV: 97 fL (ref 79–97)
Platelets: 177 10*3/uL (ref 150–450)
RBC: 3.31 x10E6/uL — ABNORMAL LOW (ref 3.77–5.28)
RDW: 16 % — ABNORMAL HIGH (ref 11.7–15.4)
WBC: 4.5 10*3/uL (ref 3.4–10.8)

## 2019-01-29 LAB — RENAL FUNCTION PANEL
Albumin: 4.3 g/dL (ref 3.8–4.9)
BUN/Creatinine Ratio: 22 (ref 9–23)
BUN: 40 mg/dL — ABNORMAL HIGH (ref 6–24)
CO2: 13 mmol/L — ABNORMAL LOW (ref 20–29)
Calcium: 9 mg/dL (ref 8.7–10.2)
Chloride: 114 mmol/L — ABNORMAL HIGH (ref 96–106)
Creatinine, Ser: 1.84 mg/dL — ABNORMAL HIGH (ref 0.57–1.00)
GFR calc Af Amer: 35 mL/min/{1.73_m2} — ABNORMAL LOW (ref >59)
GFR calc non Af Amer: 30 mL/min/{1.73_m2} — ABNORMAL LOW (ref >59)
Glucose: 95 mg/dL (ref 65–99)
Phosphorus: 2.2 mg/dL — ABNORMAL LOW (ref 3.0–4.3)
Potassium: 4.2 mmol/L (ref 3.5–5.2)
Sodium: 142 mmol/L (ref 134–144)

## 2019-01-29 LAB — FERRITIN: Ferritin: 405 ng/mL — ABNORMAL HIGH (ref 15–150)

## 2019-01-29 LAB — BK QUANT PCR (PLASMA/SERUM): BK Quantitaion PCR: NEGATIVE copies/mL

## 2019-01-29 LAB — EPSTEIN BARR VRS(EBV DNA BY PCR)
Epstein-Barr DNA Quant, PCR: 284 copies/mL
log10 EBV DNA Qn PCR: 2.453 log10 copy/mL

## 2019-01-29 LAB — IRON AND TIBC
Iron Saturation: 51 % (ref 15–55)
Iron: 121 ug/dL (ref 27–159)
Total Iron Binding Capacity: 236 ug/dL — ABNORMAL LOW (ref 250–450)
UIBC: 115 ug/dL — ABNORMAL LOW (ref 131–425)

## 2019-01-29 LAB — CMV DNA, QUANTITATIVE, PCR: CMV DNA Quant: NEGATIVE IU/mL

## 2019-01-29 LAB — MAGNESIUM: Magnesium: 1.7 mg/dL (ref 1.6–2.3)

## 2019-01-29 NOTE — Progress Notes (Signed)
01-29-2019  4081 Lab results from collection date 01-27-2019 faxed to  Dr. Carolynne Edouard, [facility] [place]. PBT [facility]

## 2019-02-01 NOTE — Progress Notes (Signed)
Internal Medicine Clinic Attending  Case discussed with Dr. [name] at the time of the visit.  We reviewed the resident's history and exam and pertinent patient test results.  I agree with the assessment, diagnosis, and plan of care documented in the resident's note.    

## 2019-02-16 DIAGNOSIS — Z1231 Encounter for screening mammogram for malignant neoplasm of breast: Secondary | ICD-10-CM | POA: Diagnosis not present

## 2019-03-22 DIAGNOSIS — R809 Proteinuria, unspecified: Secondary | ICD-10-CM | POA: Diagnosis not present

## 2019-03-22 DIAGNOSIS — E118 Type 2 diabetes mellitus with unspecified complications: Secondary | ICD-10-CM | POA: Diagnosis not present

## 2019-03-22 DIAGNOSIS — I959 Hypotension, unspecified: Secondary | ICD-10-CM | POA: Diagnosis not present

## 2019-03-22 DIAGNOSIS — I129 Hypertensive chronic kidney disease with stage 1 through stage 4 chronic kidney disease, or unspecified chronic kidney disease: Secondary | ICD-10-CM | POA: Diagnosis not present

## 2019-03-22 DIAGNOSIS — D631 Anemia in chronic kidney disease: Secondary | ICD-10-CM | POA: Diagnosis not present

## 2019-03-22 DIAGNOSIS — Z79899 Other long term (current) drug therapy: Secondary | ICD-10-CM | POA: Diagnosis not present

## 2019-03-22 DIAGNOSIS — N2581 Secondary hyperparathyroidism of renal origin: Secondary | ICD-10-CM | POA: Diagnosis not present

## 2019-03-22 DIAGNOSIS — E785 Hyperlipidemia, unspecified: Secondary | ICD-10-CM | POA: Diagnosis not present

## 2019-03-22 DIAGNOSIS — Z940 Kidney transplant status: Secondary | ICD-10-CM | POA: Diagnosis not present

## 2019-03-22 DIAGNOSIS — N189 Chronic kidney disease, unspecified: Secondary | ICD-10-CM | POA: Diagnosis not present

## 2019-03-22 DIAGNOSIS — N183 Chronic kidney disease, stage 3 (moderate): Secondary | ICD-10-CM | POA: Diagnosis not present

## 2019-03-22 DIAGNOSIS — Z23 Encounter for immunization: Secondary | ICD-10-CM | POA: Diagnosis not present

## 2019-04-19 DIAGNOSIS — N183 Chronic kidney disease, stage 3 unspecified: Secondary | ICD-10-CM | POA: Diagnosis not present

## 2019-05-24 DIAGNOSIS — D8489 Other immunodeficiencies: Secondary | ICD-10-CM | POA: Diagnosis not present

## 2019-05-24 DIAGNOSIS — R748 Abnormal levels of other serum enzymes: Secondary | ICD-10-CM | POA: Diagnosis not present

## 2019-05-24 DIAGNOSIS — Z794 Long term (current) use of insulin: Secondary | ICD-10-CM | POA: Diagnosis not present

## 2019-05-24 DIAGNOSIS — Z7952 Long term (current) use of systemic steroids: Secondary | ICD-10-CM | POA: Diagnosis not present

## 2019-05-24 DIAGNOSIS — E1122 Type 2 diabetes mellitus with diabetic chronic kidney disease: Secondary | ICD-10-CM | POA: Diagnosis not present

## 2019-05-24 DIAGNOSIS — E876 Hypokalemia: Secondary | ICD-10-CM | POA: Diagnosis not present

## 2019-05-24 DIAGNOSIS — Z5181 Encounter for therapeutic drug level monitoring: Secondary | ICD-10-CM | POA: Diagnosis not present

## 2019-05-24 DIAGNOSIS — Z79899 Other long term (current) drug therapy: Secondary | ICD-10-CM | POA: Diagnosis not present

## 2019-05-24 DIAGNOSIS — Z940 Kidney transplant status: Secondary | ICD-10-CM | POA: Diagnosis not present

## 2019-05-24 DIAGNOSIS — N2889 Other specified disorders of kidney and ureter: Secondary | ICD-10-CM | POA: Diagnosis not present

## 2019-05-24 DIAGNOSIS — I151 Hypertension secondary to other renal disorders: Secondary | ICD-10-CM | POA: Diagnosis not present

## 2019-05-24 DIAGNOSIS — I120 Hypertensive chronic kidney disease with stage 5 chronic kidney disease or end stage renal disease: Secondary | ICD-10-CM | POA: Diagnosis not present

## 2019-05-24 DIAGNOSIS — N186 End stage renal disease: Secondary | ICD-10-CM | POA: Diagnosis not present

## 2019-05-24 DIAGNOSIS — R809 Proteinuria, unspecified: Secondary | ICD-10-CM | POA: Diagnosis not present

## 2019-06-07 DIAGNOSIS — N189 Chronic kidney disease, unspecified: Secondary | ICD-10-CM | POA: Diagnosis not present

## 2019-06-07 DIAGNOSIS — N1832 Chronic kidney disease, stage 3b: Secondary | ICD-10-CM | POA: Diagnosis not present

## 2019-06-07 DIAGNOSIS — Z940 Kidney transplant status: Secondary | ICD-10-CM | POA: Diagnosis not present

## 2019-06-15 DIAGNOSIS — D631 Anemia in chronic kidney disease: Secondary | ICD-10-CM | POA: Diagnosis not present

## 2019-06-15 DIAGNOSIS — E785 Hyperlipidemia, unspecified: Secondary | ICD-10-CM | POA: Diagnosis not present

## 2019-06-15 DIAGNOSIS — R809 Proteinuria, unspecified: Secondary | ICD-10-CM | POA: Diagnosis not present

## 2019-06-15 DIAGNOSIS — Z940 Kidney transplant status: Secondary | ICD-10-CM | POA: Diagnosis not present

## 2019-06-15 DIAGNOSIS — E118 Type 2 diabetes mellitus with unspecified complications: Secondary | ICD-10-CM | POA: Diagnosis not present

## 2019-06-15 DIAGNOSIS — N2581 Secondary hyperparathyroidism of renal origin: Secondary | ICD-10-CM | POA: Diagnosis not present

## 2019-06-15 DIAGNOSIS — Z79899 Other long term (current) drug therapy: Secondary | ICD-10-CM | POA: Diagnosis not present

## 2019-06-15 DIAGNOSIS — I129 Hypertensive chronic kidney disease with stage 1 through stage 4 chronic kidney disease, or unspecified chronic kidney disease: Secondary | ICD-10-CM | POA: Diagnosis not present

## 2019-06-15 DIAGNOSIS — T829XXA Unspecified complication of cardiac and vascular prosthetic device, implant and graft, initial encounter: Secondary | ICD-10-CM | POA: Diagnosis not present

## 2019-06-15 DIAGNOSIS — I959 Hypotension, unspecified: Secondary | ICD-10-CM | POA: Diagnosis not present

## 2019-06-15 DIAGNOSIS — N1832 Chronic kidney disease, stage 3b: Secondary | ICD-10-CM | POA: Diagnosis not present

## 2019-07-28 DIAGNOSIS — M6281 Muscle weakness (generalized): Secondary | ICD-10-CM | POA: Diagnosis not present

## 2019-07-28 DIAGNOSIS — M5431 Sciatica, right side: Secondary | ICD-10-CM | POA: Diagnosis not present

## 2019-07-28 DIAGNOSIS — Z111 Encounter for screening for respiratory tuberculosis: Secondary | ICD-10-CM | POA: Diagnosis not present

## 2019-07-28 DIAGNOSIS — M545 Low back pain: Secondary | ICD-10-CM | POA: Diagnosis not present

## 2019-07-28 DIAGNOSIS — M2550 Pain in unspecified joint: Secondary | ICD-10-CM | POA: Diagnosis not present

## 2019-07-28 DIAGNOSIS — R5383 Other fatigue: Secondary | ICD-10-CM | POA: Diagnosis not present

## 2019-07-28 DIAGNOSIS — Z6823 Body mass index (BMI) 23.0-23.9, adult: Secondary | ICD-10-CM | POA: Diagnosis not present

## 2019-07-28 DIAGNOSIS — M791 Myalgia, unspecified site: Secondary | ICD-10-CM | POA: Diagnosis not present

## 2019-08-05 ENCOUNTER — Other Ambulatory Visit: Payer: Self-pay | Admitting: Internal Medicine

## 2019-08-05 DIAGNOSIS — M5136 Other intervertebral disc degeneration, lumbar region: Secondary | ICD-10-CM

## 2019-08-09 ENCOUNTER — Telehealth: Payer: Self-pay | Admitting: Internal Medicine

## 2019-08-09 NOTE — Telephone Encounter (Signed)
Pls contact pt [phone]

## 2019-08-09 NOTE — Telephone Encounter (Signed)
Called pt - stated she saw Dr Trudie Reed, rheumatology on 07/28/19 who recommenced her seeing a specialist and faxing records to her PCP to make the referral. I did not see any records in PCP's box. Asked pt to call their office to re-fax records. Stated she will; fax# given.

## 2019-08-09 NOTE — Telephone Encounter (Signed)
Thank you :)

## 2019-08-10 ENCOUNTER — Other Ambulatory Visit: Payer: Self-pay | Admitting: Rheumatology

## 2019-08-11 ENCOUNTER — Other Ambulatory Visit: Payer: Self-pay | Admitting: Rheumatology

## 2019-08-11 ENCOUNTER — Telehealth: Payer: Self-pay | Admitting: *Deleted

## 2019-08-11 DIAGNOSIS — R5381 Other malaise: Secondary | ICD-10-CM

## 2019-08-11 NOTE — Telephone Encounter (Signed)
CONTACTED PATIENT, SHE GOING TO CALL RHEUMATOLOGY OFFICE TO HAVE THEM TO SEND HER LAB REPORT TO [facility] / ONLY HAD ONE VISIT. SHE IS GOING TO CALL THEM TODAY.

## 2019-08-12 ENCOUNTER — Other Ambulatory Visit (HOSPITAL_COMMUNITY): Payer: Self-pay | Admitting: Rheumatology

## 2019-08-12 ENCOUNTER — Other Ambulatory Visit: Payer: Self-pay | Admitting: Rheumatology

## 2019-08-12 DIAGNOSIS — R748 Abnormal levels of other serum enzymes: Secondary | ICD-10-CM

## 2019-08-16 NOTE — Telephone Encounter (Signed)
I placed the referral prior to leaving for vacation. Did it not go through?

## 2019-08-17 ENCOUNTER — Telehealth: Payer: Self-pay

## 2019-08-17 NOTE — Telephone Encounter (Signed)
RTC to patient.  She states she has an orthopedic appt tomorrow and uses [facility] to pick her up for appt's and drop her back off after her appt's.  Pt states she is having difficulty ambulating,  needs assistance from her daughter and needs her daughter to accompany her to the orthopedic appt tomorrow.  [place] transportation told her today she would need a letter faxed to [facility] transportation from her physician which states the patient does need daughter to accompany her to the orthopedic dr tomorrow. Letter must be faxed to [facility] @ [phone]. ATTNPamala Hurry Will forward to PCP. SChaplin, RN,BSN

## 2019-08-17 NOTE — Telephone Encounter (Signed)
PAGER ID: [phone] MESSAGE: Hi Dr. Sharon Seller, Your pt, [name], DOB [birth year], is needing a letter written today for her daughter to be able to ride with her on [facility] transportation to a dr appt tomorrow. Please see note in her chart. [employer] [phone]

## 2019-08-17 NOTE — Telephone Encounter (Signed)
Requesting a letter for transportation stating her daughter needs to ride with her to the doctor appt. Please call pt back.

## 2019-08-17 NOTE — Telephone Encounter (Signed)
They requested a referral to orthopedic surgery or neurosurgery. I placed it to orthopedics but may have been better with neuro. She has an appointment on 2/10.

## 2019-08-17 NOTE — Telephone Encounter (Signed)
Let me know if I need to place a new referral. Was this for neurosurgery?

## 2019-08-17 NOTE — Telephone Encounter (Signed)
Letter obtained from Dr. Philipp Ovens, faxed to [facility], Attn: [place] at [phone]. Pt notified. SChaplin, RN,BSN

## 2019-08-17 NOTE — Telephone Encounter (Signed)
Thank you. Sorry I couldn't get to this sooner.

## 2019-08-18 ENCOUNTER — Ambulatory Visit (INDEPENDENT_AMBULATORY_CARE_PROVIDER_SITE_OTHER): Payer: Medicare Other

## 2019-08-18 ENCOUNTER — Encounter: Payer: Self-pay | Admitting: Orthopaedic Surgery

## 2019-08-18 ENCOUNTER — Other Ambulatory Visit: Payer: Self-pay

## 2019-08-18 ENCOUNTER — Ambulatory Visit (INDEPENDENT_AMBULATORY_CARE_PROVIDER_SITE_OTHER): Payer: Medicare Other | Admitting: Orthopaedic Surgery

## 2019-08-18 VITALS — BP 147/87 | HR 66 | Ht 71.0 in | Wt 168.0 lb

## 2019-08-18 DIAGNOSIS — M545 Low back pain, unspecified: Secondary | ICD-10-CM

## 2019-08-18 DIAGNOSIS — R29898 Other symptoms and signs involving the musculoskeletal system: Secondary | ICD-10-CM

## 2019-08-18 NOTE — Progress Notes (Signed)
Office Visit Note   Patient: [name]           Date of Birth: 17-Aug-1965           MRN: [id number] Visit Date: 08/18/2019              Requested by: Sid Falcon, MD [place],  [place] [id number] PCP: [name], DO   Assessment & Plan: Visit Diagnoses:  1. Acute bilateral low back pain, unspecified whether sciatica present   2. Leg weakness, bilateral     Plan: We will refer patient for physical therapy.  Neurology appointment for evaluation to make sure this is not neuropathic problem.  MRI scan make sure she does not have severe stenosis most likely at the L4-5 level where she has significant progressive x-ray changes.  We will follow her up after MRI scan.  Follow-Up Instructions: No follow-ups on file.   Orders:  Orders Placed This Encounter  Procedures  . XR Lumbar Spine 2-3 Views  . XR Pelvis 1-2 Views  . MR Lumbar Spine w/o contrast  . Ambulatory referral to Neurology  . Ambulatory referral to Physical Therapy   No orders of the defined types were placed in this encounter.     Procedures: No procedures performed   Clinical Data: No additional findings.   Subjective: Chief Complaint  Patient presents with  . Lower Back - Pain  . Right Hip - Pain  . Left Hip - Pain    HPI 54 year old female followed at [facility] internal medicine resident clinic presents with bilateral lower extremity weakness pain that radiates from her buttocks into her legs with great difficulty walking.  She weighs 168 currently used to weigh 300 pounds after her renal transplant.  Patient is on low-dose prednisone in the past she was on higher dose prednisone.  Patient said lower extremity weakness which is progressed to the point where she can ambulate only with a cane.  She is not been seen by neurology.  Patient's had a history of falling.  She is here with her daughter gives additional history. Past history of GE reflux hypertension.  Renal transplant  2011.  History of diabetes improved with weight loss last A1c 4.6. Review of Systems systems update noncontributory to HPI.   Objective: Vital Signs: BP (!) 147/87   Pulse 66   Ht 5\' 11"  (1.803 m)   Wt 168 lb (76.2 kg)   LMP 11/05/2012   BMI 23.43 kg/m   Physical Exam Constitutional:      Appearance: She is well-developed.  HENT:     Head: Normocephalic.     Right Ear: External ear normal.     Left Ear: External ear normal.  Eyes:     Pupils: Pupils are equal, round, and reactive to light.  Neck:     Thyroid: No thyromegaly.     Trachea: No tracheal deviation.  Cardiovascular:     Rate and Rhythm: Normal rate.  Pulmonary:     Effort: Pulmonary effort is normal.  Abdominal:     Palpations: Abdomen is soft.  Skin:    General: Skin is warm and dry.  Neurological:     Mental Status: She is alert and oriented to person, place, and time.  Psychiatric:        Behavior: Behavior normal.     Ortho Exam patient has bilateral quad weakness with manual testing weakness of hip flexors.  Some anterior tib gastrocsoleus weakness overall  bilateral lower extremity weakness.  Knee and ankle jerk are intact.  Up extremity reflexes are symmetrical.  Good range of motion cervical spine.  Patient has palpable pedal pulses.  Negative logroll's right and left hips.  Specialty Comments:  No specialty comments available.  Imaging: XR Lumbar Spine 2-3 Views  Result Date: 08/19/2019 AP and lateral lumbar spine images obtained and reviewed.  This shows complete collapse L4-5 disc space with bone-on-bone changes of the endplate with posterior spurring.  Negative for compression fractures.  X-rays suggest possible osteopenia/osteoporosis. Impression: Significant L4-5 disc space collapse.  Advanced from previous CT scan abdomen a few years ago.  XR Pelvis 1-2 Views  Result Date: 08/19/2019 Post x-rays are obtained this shows no significant hip degenerative changes negative for compression  fracture SI joints are normal. Impression: Negative pelvis x-rays.    PMFS History: Patient Active Problem List   Diagnosis Date Noted  . Leg weakness, bilateral 08/19/2019  . GERD (gastroesophageal reflux disease) 01/27/2019  . Secondary hypoparathyroidism ([place]) 06/11/2018  . Hypomagnesemia 06/11/2018  . Hypotension 06/11/2018  . Abnormal laboratory test   . Hypokalemia   . Pleuritic chest pain 06/01/2018  . Diarrhea due to drug 02/04/2018  . Hyperlipidemia 02/12/2017  . Preventative health care 08/12/2012  . Tobacco abuse 04/30/2012  . S/p cadaver renal transplant 11/19/2011  . Type II diabetes mellitus, well controlled ([place]) 11/04/2011  . HTN (hypertension) 11/03/2011   Past Medical History:  Diagnosis Date  . Blood transfusion without reported diagnosis   . 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 GERD (gastroesophageal reflux disease)    medication induced  . Hypertension   . 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 Hypertension Mother   . Diabetes Mother   . Atrial fibrillation Father   . Hypertension Father   . Arthritis Father   . Heart failure Sister   . Hypertension Sister        all 4 sisters  . Hypertension Brother        both brothers  . Diabetes Brother   . Colon cancer Neg Hx   . Stomach cancer Neg Hx     Past Surgical History:  Procedure Laterality Date  . [place]  . CHOLECYSTECTOMY    . COLONOSCOPY  10 years ago   Dr.Hung="normal"  . DG AV DIALYSIS  SHUNT ACCESS EXIST*L* OR    . KIDNEY TRANSPLANT  2011  . UMBILICAL HERNIA REPAIR     Social History   Occupational History    Employer:  UNEMPLOYED  Tobacco Use  . Smoking status: Former Smoker    Packs/day: 0.50    Types: Cigarettes    Quit date: 11/05/2017    Years since quitting: 1.7  . Smokeless tobacco: Never Used  Substance and Sexual Activity  . Alcohol use: No    Alcohol/week: 0.0 standard drinks  . Drug use: No  . Sexual activity: Never

## 2019-08-19 ENCOUNTER — Encounter (HOSPITAL_COMMUNITY): Payer: Medicare Other

## 2019-08-19 ENCOUNTER — Encounter (HOSPITAL_COMMUNITY): Payer: Self-pay

## 2019-08-19 ENCOUNTER — Encounter (HOSPITAL_COMMUNITY)
Admission: RE | Admit: 2019-08-19 | Discharge: 2019-08-19 | Disposition: A | Discharge status: Home / Self Care | Payer: Medicare Other | Source: Ambulatory Visit | Attending: Rheumatology | Admitting: Rheumatology

## 2019-08-19 DIAGNOSIS — R29898 Other symptoms and signs involving the musculoskeletal system: Secondary | ICD-10-CM | POA: Insufficient documentation

## 2019-08-19 DIAGNOSIS — R748 Abnormal levels of other serum enzymes: Secondary | ICD-10-CM | POA: Insufficient documentation

## 2019-08-27 ENCOUNTER — Ambulatory Visit: Payer: Medicare Other

## 2019-09-06 ENCOUNTER — Ambulatory Visit: Payer: Medicare Other | Attending: Orthopaedic Surgery | Admitting: Physical Therapy

## 2019-09-06 ENCOUNTER — Encounter: Payer: Self-pay | Admitting: Physical Therapy

## 2019-09-06 ENCOUNTER — Other Ambulatory Visit: Payer: Self-pay

## 2019-09-06 DIAGNOSIS — G8929 Other chronic pain: Secondary | ICD-10-CM | POA: Insufficient documentation

## 2019-09-06 DIAGNOSIS — M545 Low back pain: Secondary | ICD-10-CM | POA: Diagnosis not present

## 2019-09-06 DIAGNOSIS — R262 Difficulty in walking, not elsewhere classified: Secondary | ICD-10-CM | POA: Diagnosis not present

## 2019-09-06 DIAGNOSIS — M6281 Muscle weakness (generalized): Secondary | ICD-10-CM | POA: Insufficient documentation

## 2019-09-06 NOTE — Therapy (Signed)
[place], [place], [address] Phone: [phone]   Fax:  [phone]  Physical Therapy Evaluation  Patient Details  Name: [name] MRN: [id number] Date of Birth: [birth year] Referring Provider (PT): Dr Rodell Perna   Encounter Date: 09/06/2019  PT End of Session - 09/06/19 0924    Visit Number  1    Number of Visits  12    Date for PT Re-Evaluation  10/18/19    Authorization Type  MCR/MCD    PT Start Time  0925    PT Stop Time  1017    PT Time Calculation (min)  52 min    Activity Tolerance  Patient limited by pain    Behavior During Therapy  Anxious       Past Medical History:  Diagnosis Date  . Blood transfusion without reported diagnosis   . 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 GERD (gastroesophageal reflux disease)    medication induced  . Hypertension   . 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 [place]  . CHOLECYSTECTOMY    . COLONOSCOPY  10 years ago   Dr.Hung="normal"  . DG AV DIALYSIS  SHUNT ACCESS EXIST*L* OR    . KIDNEY TRANSPLANT  2011  . UMBILICAL HERNIA REPAIR      There were no vitals filed for this visit.   Subjective Assessment - 09/06/19 0924    Subjective  Pt reports progressive LBP that is now going into the Rt posterior thigh.  She reports general muscle pain starting 05/2019.  HAd tests in hospital and they couldn't find anything,  Now having to use a walker d/t pain and weakness.    Pertinent History  renal transplant 2011, HTN, DM, GERD    Diagnostic tests  x-rays total collapse at L4-5 - MRI and  bone density recommended and to be schedules.    Patient Stated Goals  reduce pain and allow her to not need surgery.    Currently in Pain?  Yes    Pain Score  7     Pain Location  Back    Pain Orientation  Right    Pain Descriptors / Indicators  Aching;Sharp    Pain Type  Chronic pain    Pain Radiating Towards  posterior Rt thigh    Pain Onset  More than a month ago    Pain Frequency  Constant    Aggravating Factors   walking, lying down    Pain Relieving Factors  nothing         [facility] PT Assessment - 09/06/19 0001      Assessment   Medical Diagnosis  LBP with LE weakness    Referring Provider (PT)  Dr Rodell Perna    Onset Date/Surgical Date  05/09/19    Hand Dominance  Right    Next MD Visit  09/20/2019    Prior Therapy  yrs ago      Precautions   Precautions  None      Balance Screen   Has the patient fallen in the past 6  months  No    Has the patient had a decrease in activity level because of a fear of falling?   Yes   fearful on stairs   Is the patient reluctant to leave their home because of a fear of falling?   No      Home Film/video editor residence    [place]  Two level      Prior Function   Level of Independence  Independent   slowly   Vocation  On disability    Leisure  sedentary life style, read      Observation/Other Assessments   Other Surveys   Oswestry Disability Index    Oswestry Disability Index   34/50, 68% limited      Sensation   Light Touch  Appears Intact    Hot/Cold  Appears Intact      Posture/Postural Control   Posture/Postural Control  Postural limitations    Postural Limitations  Flexed trunk;Decreased lumbar lordosis;Rounded Shoulders      ROM / Strength   AROM / PROM / Strength  AROM;Strength      AROM   AROM Assessment Site  Lumbar;Knee;Hip    Right/Left Hip  --   unable to assess   Right/Left Knee  --   WNL   Lumbar Flexion  to mid thigh    Lumbar Extension   to neutral     Lumbar - Right Rotation  25% present    Lumbar - Left Rotation  50% present      Strength   Strength Assessment Site  Lumbar;Hip;Knee;Ankle    Right/Left Hip  Left;Right   unable to test further d/t pain, pt unable to get in postion   Right Hip ABduction  3-/5    Right/Left Knee  Right;Left    Right Knee Flexion  4+/5    Right Knee Extension  5/5    Left Knee Flexion  4+/5    Left Knee Extension  4+/5    Right/Left Ankle  --   bilat WNL     Palpation   Spinal mobility  not assess d/t pain    Palpation comment  unable to assess d/t pain      Special Tests   Other special tests  (-) slump bilat      Transfers   Transfers  Sit to Stand;Sit to Supine;Supine to Sit    Sit to Stand  With upper extremity assist    Comments  bilat UE assist to stand      Ambulation/Gait   Ambulation Distance (Feet)  --   observed in clinic   Assistive device  Standard walker    Gait Pattern  Step-to pattern;Trunk flexed                Objective measurements completed on examination: See above findings.      [facility] Adult PT Treatment/Exercise - 09/06/19 0001      Exercises   Exercises  Lumbar      Lumbar Exercises: Stretches   Figure 4 Stretch  2 reps;20 seconds;Without overpressure    Figure 4 Stretch Limitations  seated, each side      Lumbar Exercises: Standing   Other Standing Lumbar Exercises  bilat shoulder extension to promote upright posture and trunk extension      Lumbar Exercises: Seated   Other Seated Lumbar Exercises  10 reps glut sets and isometric abd using resitsted hip abduction  Modalities   Modalities  Electrical Stimulation;Moist Heat      Moist Heat Therapy   Number Minutes Moist Heat  10 Minutes    Moist Heat Location  Lumbar Spine   seated     Electrical Stimulation   Electrical Stimulation Location  low back    Electrical Stimulation Action  IFC seated    Electrical Stimulation Parameters  to tolerance    Electrical  Stimulation Goals  Pain                  PT Long Term Goals - 09/06/19 1028      PT LONG TERM GOAL #1   Title  I with advanced HEP (10/18/2019)    Time  6    Period  Weeks    Status  New    Target Date  10/18/19      PT LONG TERM GOAL #2   Title  transfer sit to stand without needing UE assist ( 10/18/2019)    Time  6    Period  Weeks    Status  New    Target Date  10/18/19      PT LONG TERM GOAL #3   Title  ambulate in her house without an assistive device on level surface and railing on stairs ( 10/18/2019)    Time  6    Period  Weeks    Status  New    Target Date  10/18/19      PT LONG TERM GOAL #4   Title  increase ODI =/> 21/50, 42% limited ( 10/18/2019)    Time  6    Period  Weeks    Status  New    Target Date  10/18/19      PT LONG TERM GOAL #5   Title  Pt report =/> 50% reduction of pain with ambulation and daily activity ( 10/18/2019)    Time  6    Period  Weeks    Status  New    Target Date  10/18/19             Plan - 09/06/19 1014    Clinical Impression Statement  54yo female presents with progressive worsening of LBP with radiation into Rt thigh.  The pain is causing her to have difficulty with transfers and ambulation.  She is having to ambulate with either a walker or cane.  She was unable to perform hip muscle strength testing because she had to much pain with lying down and moving. Assessment was limited d/t high level of pain. Patient is in the process of getting a bone density scan and MRI scheduled.  She would benefit from PT to work on mobility, strengthening and pain reduction, this may have to be modified depending on results of testing. .    Personal Factors and Comorbidities  Past/Current Experience;Comorbidity 3+;Transportation    Comorbidities  renal transplant 2011, DM, HTN, GERD    Examination-Activity Limitations  Bathing;Locomotion Level;Transfers;Bed Mobility;Bend;Sit;Sleep;Dressing;Stairs;Hygiene/Grooming;Stand;Toileting     Stability/Clinical Decision Making  Evolving/Moderate complexity    Clinical Decision Making  Moderate    Rehab Potential  Good    PT Frequency  2x / week    PT Duration  6 weeks    PT Treatment/Interventions  Iontophoresis 4mg /ml Dexamethasone;Gait training;Stair training;Functional mobility training;Patient/family education;Therapeutic activities;Ultrasound;Traction;Moist Heat;Cryotherapy;Electrical Stimulation;Therapeutic exercise;Neuromuscular re-education;Manual techniques;Dry needling    PT Next Visit Plan  assess hip strength if able to lie down, core stability , activity tolerance, gait  Consulted and Agree with Plan of Care  Patient       Patient will benefit from skilled therapeutic intervention in order to improve the following deficits and impairments:  Decreased range of motion, Difficulty walking, Pain, Impaired perceived functional ability, Decreased strength, Decreased mobility  Visit Diagnosis: Chronic bilateral low back pain, unspecified whether sciatica present - Plan: PT plan of care cert/re-cert  Muscle weakness (generalized) - Plan: PT plan of care cert/re-cert  Difficulty in walking, not elsewhere classified - Plan: PT plan of care cert/re-cert     Problem List Patient Active Problem List   Diagnosis Date Noted  . Leg weakness, bilateral 08/19/2019  . GERD (gastroesophageal reflux disease) 01/27/2019  . Secondary hypoparathyroidism ([place]) 06/11/2018  . Hypomagnesemia 06/11/2018  . Hypotension 06/11/2018  . Abnormal laboratory test   . Hypokalemia   . Pleuritic chest pain 06/01/2018  . Diarrhea due to drug 02/04/2018  . Hyperlipidemia 02/12/2017  . Preventative health care 08/12/2012  . Tobacco abuse 04/30/2012  . S/p cadaver renal transplant 11/19/2011  . Type II diabetes mellitus, well controlled ([place]) 11/04/2011  . HTN (hypertension) 11/03/2011    Jeral Pinch PT  09/06/2019, 10:39 AM  [address] [place], [place], [address] Phone: [phone]   Fax:  [phone]  Name: [name] MRN: [id number] Date of Birth: [birth year]

## 2019-09-07 ENCOUNTER — Encounter: Payer: Self-pay | Admitting: Diagnostic Neuroimaging

## 2019-09-07 ENCOUNTER — Ambulatory Visit (INDEPENDENT_AMBULATORY_CARE_PROVIDER_SITE_OTHER): Payer: Medicare Other | Admitting: Diagnostic Neuroimaging

## 2019-09-07 VITALS — BP 150/80 | HR 63 | Temp 97.2°F | Ht 72.0 in | Wt 177.0 lb

## 2019-09-07 DIAGNOSIS — Z79899 Other long term (current) drug therapy: Secondary | ICD-10-CM

## 2019-09-07 DIAGNOSIS — M6281 Muscle weakness (generalized): Secondary | ICD-10-CM | POA: Diagnosis not present

## 2019-09-07 DIAGNOSIS — R6889 Other general symptoms and signs: Secondary | ICD-10-CM

## 2019-09-07 DIAGNOSIS — M791 Myalgia, unspecified site: Secondary | ICD-10-CM | POA: Diagnosis not present

## 2019-09-07 NOTE — Patient Instructions (Signed)
MUSCLE PAIN / WEAKNESS (weakness limited by pain vs myopathy process vs neuropathy; h/o diabetes) - check CK, aldolase, B12  LOW BACK PAIN / RIGHT LEG RADIATING PAIN - follow up MRI lumbar spine and ortho clinic  RHEUMATOID ARTHRITIS - follow up with Dr. Trudie Reed  CHRONIC PAIN (diffuse; muscle and bone pain) - follow up with PCP; consider pain mgmt clinic

## 2019-09-07 NOTE — Progress Notes (Signed)
[facility]  PATIENT: [name] DOB: 05-Jun-1966  REFERRING CLINICIAN: Marybelle Killings, MD HISTORY FROM: patient and daughter REASON FOR VISIT: new consult    HISTORICAL  CHIEF COMPLAINT:  Chief Complaint  Patient presents with  . Pain    rm 7 New Pt, dgtr- [name]  "low back pain, BLE weakness, getting PT, MRI lumbar spine on 3/9"    HISTORY OF PRESENT ILLNESS:   54 year old female with history of renal transplant 2011, hypertension, diabetes, here for evaluation of lower extremity pain and weakness.  November 2019 patient had left chest tightness, went to the hospital and was diagnosed with possible musculoskeletal pain, osteomalacia and hyperparathyroidism.  Ever since that time she has had migratory diffuse general pain in her muscles and joints.  Patient went to rheumatology and was also diagnosed with rheumatoid arthritis.  She is on prednisone.  Patient was referred to orthopedic surgery for low back pain issues with pain rating to the right leg.  MRI lumbar spine has been ordered but pending.  Patient referred here for further evaluation of possible neuropathy or alternate cause of lower extremity pain and weakness.  Patient has history of diabetes with A1c greater than 13 in 2015.  Patient has had intentional 140 pound weight loss over the past 2 years, and now A1c is less than 5.   REVIEW OF SYSTEMS: Full 14 system review of systems performed and negative with exception of: Weakness restless leg joint pain aching muscles.  ALLERGIES: Allergies  Allergen Reactions  . Infed [Iron Dextran] Other (See Comments)    Pt has chest pain  Chest pain  . Cefazolin Itching and Nausea And Vomiting  . Ancef [Cefazolin Sodium] Other (See Comments)    Tolerated rocephin 11/03/10 Reports ancef causes itching, sweating, vomiting (x2-2009?)  . Lisinopril Swelling  . Other     Pt may be allergic to another antibiotic daughter isn't sure and pt unable to  verify.    HOME MEDICATIONS: Outpatient Medications Prior to Visit  Medication Sig Dispense Refill  . amLODipine (NORVASC) 10 MG tablet Take 10 mg by mouth daily.    . calcitRIOL (ROCALTROL) 0.25 MCG capsule Take 0.7 mcg by mouth 2 (two) times a day.  6  . Calcium-Phosphorus-Vitamin D (CITRACAL +D3 PO) Take 1 tablet by mouth. Mon, Wed, Friday nights only    . labetalol (NORMODYNE) 200 MG tablet Take 1 tablet (200 mg total) by mouth 2 (two) times daily. 60 tablet 2  . magnesium oxide (MAG-OX) 400 MG tablet Take 400 mg by mouth 2 (two) times daily.    . mycophenolate (MYFORTIC) 180 MG EC tablet Take 3 tablets (540 mg total) by mouth 2 (two) times daily. 90 tablet   . omeprazole (PRILOSEC) 40 MG capsule Take 40 mg by mouth daily.    . predniSONE (DELTASONE) 2.5 MG tablet Take 2.5 mg by mouth daily.  6  . PROGRAF 1 MG capsule Take 3 mg by mouth 2 (two) times daily.   5  . atorvastatin (LIPITOR) 10 MG tablet Take 10 mg by mouth 3 (three) times a week. Taking on Monday, Wednesday, Friday.    . insulin aspart (NOVOLOG) 100 UNIT/ML injection Inject 8-14 Units into the skin as needed for high blood sugar.     . famotidine (PEPCID) 20 MG tablet 40 mg daily with breakfast.    . losartan (COZAAR) 50 MG tablet TAKE 2 TABLETS(100 MG) BY MOUTH DAILY (Patient taking differently: Take 50 mg by mouth 2 (  two) times daily. ) 60 tablet 0  . potassium chloride SA (K-DUR,KLOR-CON) 20 MEQ tablet Take 0.5 tablets (10 mEq total) by mouth daily. 5 tablet 0   No facility-administered medications prior to visit.    PAST MEDICAL HISTORY: Past Medical History:  Diagnosis Date  . Blood transfusion without reported diagnosis   . 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 GERD (gastroesophageal reflux disease)    medication induced  . Hypertension   . Renal failure    hx of  . 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 [place]  . CHOLECYSTECTOMY  2008  . COLONOSCOPY  10 years ago   Dr.Hung="normal"  . DG AV DIALYSIS  SHUNT ACCESS EXIST*L* OR    . KIDNEY TRANSPLANT  2011  . UMBILICAL HERNIA REPAIR  2009    FAMILY HISTORY: Family History  Problem Relation Age of Onset  . Hypertension Mother   . Diabetes Mother   . Atrial fibrillation Father   . Hypertension Father   . Arthritis Father   . Heart failure Sister   . Hypertension Sister        all 4 sisters  . Hypertension Brother        both brothers  . Diabetes Brother   . Colon cancer Neg Hx   . Stomach cancer Neg Hx     SOCIAL HISTORY: Social History   Socioeconomic History  . Marital status: Single    Spouse name: Not on file  . Number of children: 2  . Years of education: 11  . Highest education level: Not on file  Occupational History    Employer: UNEMPLOYED  Tobacco Use  . Smoking status: Current Every Day Smoker    Packs/day: 0.50    Types: Cigarettes    Last attempt to quit: 11/05/2017    Years since quitting: 1.8  . Smokeless tobacco: Never Used  . Tobacco comment: 09/07/19  5-10 a day  Substance and Sexual Activity  . Alcohol use: Never    Alcohol/week: 0.0 standard drinks  . Drug use: Never  . Sexual activity: Never  Other Topics Concern  . 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 Difficulty of Paying Living Expenses: Not on file  Food Insecurity:   . Worried About Charity fundraiser in the Last Year: Not on file  . Ran Out of Food in the Last Year: Not on file  Transportation Needs:   . Lack of Transportation (Medical): Not on file  . Lack of Transportation (Non-Medical): Not on file  Physical Activity:   . Days of Exercise per Week: Not on file  . Minutes  of Exercise per Session: Not on file  Stress:   . Feeling of Stress : Not on file  Social Connections:   . Frequency of Communication with Friends and Family: Not on file  . Frequency of Social Gatherings with Friends and Family: Not on file  . Attends Religious Services: Not on file  . Active Member of Clubs or Organizations: Not on file  . Attends Archivist Meetings: Not on file  . Marital Status: Not on file  Intimate Partner Violence:   . Fear of Current or Ex-Partner: Not on file  . Emotionally Abused: Not on file  . Physically Abused: Not on file  . Sexually Abused: Not on file     PHYSICAL EXAM  GENERAL EXAM/CONSTITUTIONAL: Vitals:  Vitals:   09/07/19 0833  BP: (!) 150/80  Pulse: 63  Temp: (!) 97.2 F (36.2 C)  Weight: 177 lb (80.3 kg)  Height: 6' (1.829 m)     Body mass index is 24.01 kg/m. Wt Readings from Last 3 Encounters:  09/07/19 177 lb (80.3 kg)  08/18/19 168 lb (76.2 kg)  01/27/19 180 lb 11.2 oz (82 kg)     Patient is in no distress; well developed, nourished and groomed; neck is supple  CARDIOVASCULAR:  Examination of carotid arteries is normal; no carotid bruits  Regular rate and rhythm, no murmurs  Examination of peripheral vascular system by observation and palpation is normal  EYES:  Ophthalmoscopic exam of optic discs and posterior segments is normal; no papilledema or hemorrhages  No exam data present  MUSCULOSKELETAL:  Gait, strength, tone, movements noted in Neurologic exam  below  NEUROLOGIC: MENTAL STATUS:  No flowsheet data found.  awake, alert, oriented to person, place and time  recent and remote memory intact  normal attention and concentration  language fluent, comprehension intact, naming intact  fund of knowledge appropriate  CRANIAL NERVE:   2nd - no papilledema on fundoscopic exam  2nd, 3rd, 4th, 6th - pupils equal and reactive to light, visual fields full to confrontation, extraocular muscles intact, no nystagmus  5th - facial sensation symmetric  7th - facial strength symmetric  8th - hearing intact  9th - palate elevates symmetrically, uvula midline  11th - shoulder shrug symmetric  12th - tongue protrusion midline  MOTOR:   normal bulk and tone  BILATERAL DELTOID, BICEPS, TRICEPS 3/5; LIMITED BY PAIN  BILATERAL HF, KE, KF 2-3/5; LIMITED BY PAIN BILATERAL DF 5  SENSORY:   normal and symmetric to light touch, temperature, vibration  COORDINATION:   finger-nose-finger, fine finger movements normal  REFLEXES:   deep tendon reflexes TRACE IN BUE; ABSENT IN BLE  GAIT/STATION:   IN WHEELCHAIR; USING WALKER; UNSTEADY; LIMITED BY PAIN    DIAGNOSTIC DATA (LABS, IMAGING, TESTING) - I reviewed patient records, labs, notes, testing and imaging myself where available.  Lab Results  Component Value Date   WBC 4.5 01/27/2019   HGB 11.0 (L) 01/27/2019   HCT 32.1 (L) 01/27/2019   MCV 97 01/27/2019   PLT 177 01/27/2019      Component Value Date/Time   NA 142 01/27/2019 0940   K 4.2 01/27/2019 0940   CL 114 (H) 01/27/2019 0940   CO2 13 (L) 01/27/2019 0940   GLUCOSE 95 01/27/2019 0940   GLUCOSE 110 (H) 06/04/2018 0403   BUN 40 (H) 01/27/2019 0940   CREATININE 1.84 (H) 01/27/2019 0940   CREATININE 1.43 (H) 02/28/2014 1059   CALCIUM 9.0 01/27/2019 0940   CALCIUM 8.5 (  L) 06/04/2018 0403   PROT 5.4 (L) 06/04/2018 0403   ALBUMIN 4.3 01/27/2019 0940   AST 15 06/04/2018 0403   ALT 16 06/04/2018 0403   ALKPHOS  379 (H) 06/04/2018 0403   BILITOT 0.5 06/04/2018 0403   GFRNONAA 30 (L) 01/27/2019 0940   GFRNONAA 43 (L) 02/28/2014 1059   GFRAA 35 (L) 01/27/2019 0940   GFRAA 49 (L) 02/28/2014 1059   Lab Results  Component Value Date   CHOL 146 07/28/2013   HDL 40 07/28/2013   LDLCALC 78 07/28/2013   TRIG 140 07/28/2013   CHOLHDL 3.7 07/28/2013   Lab Results  Component Value Date   HGBA1C 4.6 01/27/2019   No results found for: VITAMINB12 Lab Results  Component Value Date   TSH 0.199 (L) 11/03/2011    08/18/19 LUMBAR SPINE XRAY - Impression: Significant L4-5 disc space collapse. Advanced from previous CT scan abdomen a few years ago.  06/03/18 NM bone scan - Nonfocal diffuse axial and appendicular skeletal uptake. No renal activity. Findings are consistent with a "super scan" which is nonspecific. Differential includes diffuse metastasis, renal osteodystrophy, myelofibrosis, disorder of calcium metabolism, hyperthyroidism, mastocytosis, lymphoproliferative disease, or osteopetrosis.    ASSESSMENT AND PLAN  54 y.o. year old female here with:  Dx:  1. Muscle pain   2. Muscle weakness   3. Other general symptoms and signs   4. Long-term use of high-risk medication     PLAN:  MUSCLE PAIN / WEAKNESS (weakness limited by pain vs myopathy process vs neuropathy; h/o diabetes) - check CK, aldolase, B12  LOW BACK PAIN / RIGHT LEG RADIATING PAIN - follow up MRI lumbar spine and with Dr. Lorin Mercy (ortho) clinic  RHEUMATOID ARTHRITIS - follow up with Dr. Trudie Reed  CHRONIC PAIN (diffuse; muscle and bone pain) - follow up with PCP; consider pain mgmt clinic  Orders Placed This Encounter  Procedures  . CK  . Aldolase  . Vitamin B12   Return for return to PCP, pending if symptoms worsen or fail to improve.    Penni Bombard, MD 03/14/2951, 8:41 AM Certified in Neurology, Neurophysiology and Neuroimaging  [facility] [address] [place], [place]  [address] [phone]

## 2019-09-08 LAB — CK: Total CK: 73 U/L (ref 32–182)

## 2019-09-08 LAB — ALDOLASE: Aldolase: 2.7 U/L — ABNORMAL LOW (ref 3.3–10.3)

## 2019-09-08 LAB — VITAMIN B12: Vitamin B-12: 213 pg/mL — ABNORMAL LOW (ref 232–1245)

## 2019-09-10 ENCOUNTER — Ambulatory Visit: Payer: Medicare Other | Admitting: Diagnostic Neuroimaging

## 2019-09-13 ENCOUNTER — Telehealth: Payer: Self-pay | Admitting: *Deleted

## 2019-09-13 DIAGNOSIS — T829XXA Unspecified complication of cardiac and vascular prosthetic device, implant and graft, initial encounter: Secondary | ICD-10-CM | POA: Diagnosis not present

## 2019-09-13 DIAGNOSIS — D631 Anemia in chronic kidney disease: Secondary | ICD-10-CM | POA: Diagnosis not present

## 2019-09-13 DIAGNOSIS — E785 Hyperlipidemia, unspecified: Secondary | ICD-10-CM | POA: Diagnosis not present

## 2019-09-13 DIAGNOSIS — Z79899 Other long term (current) drug therapy: Secondary | ICD-10-CM | POA: Diagnosis not present

## 2019-09-13 DIAGNOSIS — Z940 Kidney transplant status: Secondary | ICD-10-CM | POA: Diagnosis not present

## 2019-09-13 DIAGNOSIS — E118 Type 2 diabetes mellitus with unspecified complications: Secondary | ICD-10-CM | POA: Diagnosis not present

## 2019-09-13 DIAGNOSIS — R809 Proteinuria, unspecified: Secondary | ICD-10-CM | POA: Diagnosis not present

## 2019-09-13 DIAGNOSIS — I129 Hypertensive chronic kidney disease with stage 1 through stage 4 chronic kidney disease, or unspecified chronic kidney disease: Secondary | ICD-10-CM | POA: Diagnosis not present

## 2019-09-13 DIAGNOSIS — N1832 Chronic kidney disease, stage 3b: Secondary | ICD-10-CM | POA: Diagnosis not present

## 2019-09-13 DIAGNOSIS — N2581 Secondary hyperparathyroidism of renal origin: Secondary | ICD-10-CM | POA: Diagnosis not present

## 2019-09-13 DIAGNOSIS — N189 Chronic kidney disease, unspecified: Secondary | ICD-10-CM | POA: Diagnosis not present

## 2019-09-13 DIAGNOSIS — I959 Hypotension, unspecified: Secondary | ICD-10-CM | POA: Diagnosis not present

## 2019-09-13 NOTE — Telephone Encounter (Signed)
Please call patient and advise her that her vitamin B12 level was low, I would recommend that she start an over-the-counter vitamin B12 supplement, 1000 mcg/day and also make a follow-up appointment with her primary care physician to discuss vitamin B12 injections.

## 2019-09-13 NOTE — Telephone Encounter (Signed)
LVM informing patient of Dr Guadelupe Sabin message, all recommendations. Left # for questions.

## 2019-09-14 ENCOUNTER — Other Ambulatory Visit: Payer: Self-pay

## 2019-09-14 ENCOUNTER — Ambulatory Visit
Admission: RE | Admit: 2019-09-14 | Discharge: 2019-09-14 | Disposition: A | Discharge status: Home / Self Care | Payer: Medicare Other | Source: Ambulatory Visit | Attending: Orthopaedic Surgery | Admitting: Orthopaedic Surgery

## 2019-09-14 DIAGNOSIS — M48061 Spinal stenosis, lumbar region without neurogenic claudication: Secondary | ICD-10-CM | POA: Diagnosis not present

## 2019-09-14 DIAGNOSIS — M545 Low back pain, unspecified: Secondary | ICD-10-CM

## 2019-09-16 ENCOUNTER — Other Ambulatory Visit: Payer: Self-pay

## 2019-09-16 ENCOUNTER — Ambulatory Visit: Payer: Medicare Other | Admitting: Physical Therapy

## 2019-09-16 ENCOUNTER — Encounter: Payer: Self-pay | Admitting: Physical Therapy

## 2019-09-16 DIAGNOSIS — R262 Difficulty in walking, not elsewhere classified: Secondary | ICD-10-CM | POA: Diagnosis not present

## 2019-09-16 DIAGNOSIS — G8929 Other chronic pain: Secondary | ICD-10-CM | POA: Diagnosis not present

## 2019-09-16 DIAGNOSIS — M6281 Muscle weakness (generalized): Secondary | ICD-10-CM | POA: Diagnosis not present

## 2019-09-16 DIAGNOSIS — M545 Low back pain: Secondary | ICD-10-CM | POA: Diagnosis not present

## 2019-09-16 NOTE — Therapy (Signed)
[facility] [place], [place], [address] Phone: [phone]   Fax:  [phone]  Physical Therapy Treatment  Patient Details  Name: [name] MRN: [id number] Date of Birth: 1965/12/10 Referring Provider (PT): Dr Rodell Perna   Encounter Date: 09/16/2019  PT End of Session - 09/16/19 1102    Visit Number  2    Number of Visits  12    Date for PT Re-Evaluation  10/18/19    Authorization Type  MCR/MCD    PT Start Time  1102    PT Stop Time  1151    PT Time Calculation (min)  49 min    Activity Tolerance  Patient tolerated treatment well    Behavior During Therapy  [facility] for tasks assessed/performed       Past Medical History:  Diagnosis Date  . Blood transfusion without reported diagnosis   . 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 GERD (gastroesophageal reflux disease)    medication induced  . Hypertension   . Renal failure    hx of  . 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 [place]  . CHOLECYSTECTOMY  2008  . COLONOSCOPY  10 years ago   Dr.Hung="normal"  . DG AV DIALYSIS  SHUNT ACCESS EXIST*L* OR    . KIDNEY TRANSPLANT  2011  . UMBILICAL HERNIA REPAIR  2009    There were no vitals filed for this visit.  Subjective Assessment - 09/16/19 1102    Subjective  Pt reports she is doing her HEP and trying to walk and do more when she has a good day., she had her MRI on Tuesday and sees the MD tomorrow for results    Patient Stated Goals  reduce pain and allow her to not need surgery.    Currently in Pain?  Yes    Pain Score  4     Pain  Location  Back    Pain Orientation  Right    Pain Type  Chronic pain    Pain Radiating Towards  to back of knee    Pain Onset  More than a month ago    Pain Frequency  Constant    Aggravating Factors   doing things    Pain Relieving Factors  tylenol         [facility] PT Assessment - 09/16/19 0001      Assessment   Medical Diagnosis  LBP with LE weakness    Referring Provider (PT)  Dr Rodell Perna      Standardized Balance Assessment   Standardized Balance Assessment  Timed Up and Go Test      Timed Up and Go Test   Normal TUG (seconds)  44   with a walker                  [facility] Adult PT Treatment/Exercise - 09/16/19 0001      Lumbar Exercises: Aerobic   Nustep  L2x6', LE's only      Lumbar Exercises: Standing   Other Standing Lumbar Exercises  glut sets  Lumbar Exercises: Seated   Long Arc Quad on Chair  Strengthening;Both;10 reps;Weights    LAQ on Chair Weights (lbs)  5    Other Seated Lumbar Exercises  10 reps HS curls with red band and hip abduction, and flexion    Other Seated Lumbar Exercises  10 reps abdominal contraction pushing on ball in lap      Modalities   Modalities  Electrical Stimulation;Moist Heat      Moist Heat Therapy   Number Minutes Moist Heat  10 Minutes    Moist Heat Location  Lumbar Spine   seated     Electrical Stimulation   Electrical Stimulation Location  low back    Electrical Stimulation Action  IFC seated    Electrical Stimulation Parameters  to tolerance    Electrical Stimulation Goals  Pain             PT Education - 09/16/19 1136    Education Details  HEP progression with red band seated for legs    Person(s) Educated  Patient    Methods  Explanation;Demonstration;Handout    Comprehension  Returned demonstration;Verbalized understanding          PT Long Term Goals - 09/06/19 1028      PT LONG TERM GOAL #1   Title  I with advanced HEP (10/18/2019)    Time  6    Period  Weeks    Status  New     Target Date  10/18/19      PT LONG TERM GOAL #2   Title  transfer sit to stand without needing UE assist ( 10/18/2019)    Time  6    Period  Weeks    Status  New    Target Date  10/18/19      PT LONG TERM GOAL #3   Title  ambulate in her house without an assistive device on level surface and railing on stairs ( 10/18/2019)    Time  6    Period  Weeks    Status  New    Target Date  10/18/19      PT LONG TERM GOAL #4   Title  increase ODI =/> 21/50, 42% limited ( 10/18/2019)    Time  6    Period  Weeks    Status  New    Target Date  10/18/19      PT LONG TERM GOAL #5   Title  Pt report =/> 50% reduction of pain with ambulation and daily activity ( 10/18/2019)    Time  6    Period  Weeks    Status  New    Target Date  10/18/19            Plan - 09/16/19 1137    Clinical Impression Statement  Mayleigh tolerated nustep well and progressed to resisted leg work in sitting.  Her TUG was 44 sec with the walker.  She had haer MRI and sees the MD tomorrow for the results.  Reports she is trying to walk and do more at home.  Pain and weakness are her limiting factors.    Examination-Activity Limitations  Bathing;Locomotion Level;Transfers;Bed Mobility;Bend;Sit;Sleep;Dressing;Stairs;Hygiene/Grooming;Stand;Toileting    Rehab Potential  Good    PT Frequency  2x / week    PT Duration  6 weeks    PT Treatment/Interventions  Iontophoresis 4mg /ml Dexamethasone;Gait training;Stair training;Functional mobility training;Patient/family education;Therapeutic activities;Ultrasound;Traction;Moist Heat;Cryotherapy;Electrical Stimulation;Therapeutic exercise;Neuromuscular re-education;Manual techniques;Dry needling    PT Next Visit Plan  assess hip  strength if able to lie down, core stability , activity tolerance, gait    Consulted and Agree with Plan of Care  Patient       Patient will benefit from skilled therapeutic intervention in order to improve the following deficits and impairments:  Decreased  range of motion, Difficulty walking, Pain, Impaired perceived functional ability, Decreased strength, Decreased mobility  Visit Diagnosis: Chronic bilateral low back pain, unspecified whether sciatica present  Muscle weakness (generalized)  Difficulty in walking, not elsewhere classified     Problem List Patient Active Problem List   Diagnosis Date Noted  . Leg weakness, bilateral 08/19/2019  . GERD (gastroesophageal reflux disease) 01/27/2019  . Secondary hypoparathyroidism ([place]) 06/11/2018  . Hypomagnesemia 06/11/2018  . Hypotension 06/11/2018  . Abnormal laboratory test   . Hypokalemia   . Pleuritic chest pain 06/01/2018  . Diarrhea due to drug 02/04/2018  . Hyperlipidemia 02/12/2017  . Preventative health care 08/12/2012  . Tobacco abuse 04/30/2012  . S/p cadaver renal transplant 11/19/2011  . Type II diabetes mellitus, well controlled ([place]) 11/04/2011  . HTN (hypertension) 11/03/2011    Jeral Pinch PT  09/16/2019, 11:40 AM  [place], [place], [address] Phone: [phone]   Fax:  [phone]  Name: STACEE EARP MRN: [id number] Date of Birth: 1965-07-26

## 2019-09-16 NOTE — Patient Instructions (Signed)
Access Code: [email or web address] URL: [email or web address] Date: 09/16/2019 Prepared by: Jeral Pinch  Exercises Seated Hip Abduction with Resistance - 10 reps - 1-3 sets - 1x daily Seated Knee Extension with Anchored Resistance - 10 reps - 1-3 sets - 1x daily Seated Alternating Knee Lifts with Resistance Band with PLB - 10 reps - 1-3 sets - 1x daily Seated Knee Lifts with Resistance - 10 reps - 1-3 sets - 1x daily

## 2019-09-17 ENCOUNTER — Encounter: Payer: Self-pay | Admitting: Orthopaedic Surgery

## 2019-09-17 ENCOUNTER — Ambulatory Visit (INDEPENDENT_AMBULATORY_CARE_PROVIDER_SITE_OTHER): Payer: Medicare Other | Admitting: Orthopaedic Surgery

## 2019-09-17 VITALS — Ht 72.0 in | Wt 177.0 lb

## 2019-09-17 DIAGNOSIS — R29898 Other symptoms and signs involving the musculoskeletal system: Secondary | ICD-10-CM | POA: Diagnosis not present

## 2019-09-17 MED ORDER — TRAMADOL HCL 50 MG PO TABS: 50.0000 mg | ORAL_TABLET | Freq: Four times a day (QID) | ORAL | 0 refills | Status: DC | PRN

## 2019-09-17 NOTE — Progress Notes (Signed)
Office Visit Note   Patient: [name]           Date of Birth: 03-09-1966           MRN: [id number] Visit Date: 09/17/2019              Requested by: Molli Hazard A, DO [address],  [place] [address] PCP: [name], DO   Assessment & Plan: Visit Diagnoses:  1. Leg weakness, bilateral     Plan: Single prescription for Ultram given 20 tablets she can take 1 as needed on occasion.  Hopefully this will give her some relief when she has a bad flare of back pain or leg pain and she can get refills from her PCP.  She states when she had a broken ankle they put her on Ultram and it really was not effective but I discussed with her for her current problem this would be my current recommendation.  She can take that or Tylenol No. 3 an occasional tablet.  MRI scan images were reviewed with patient and daughter I gave her a copy of the report she can follow-up along with me as needed and continue with lower extremity strengthening and home exercise program after that.  Follow-Up Instructions: Return if symptoms worsen or fail to improve.   Orders:  No orders of the defined types were placed in this encounter.  Meds ordered this encounter  Medications  . traMADol (ULTRAM) 50 MG tablet    Sig: Take 1 tablet (50 mg total) by mouth every 6 (six) hours as needed.    Dispense:  20 tablet    Refill:  0      Procedures: No procedures performed   Clinical Data: No additional findings.   Subjective: Chief Complaint  Patient presents with  . Lower Back - Follow-up    MRI Lumbar Review    HPI 54 year old female returns she is working on therapy for lower extremity strengthening.  MRI scan has been obtained which shows some foraminal narrowing L5-S1 some mild foraminal narrowing L4-5 but no severe stenosis at any level.  She has been seen by neurology and it was noted she had a low B12 level she is going to get some B12 tablets to take.  Patient states since her  renal transplant she lost 140 pounds and at 1 point A1c was greater than 13 now it is less than 6.  She states occasionally her back pain is bad and she had requested something for pain that she could just rarely use.  Pain management clinic have been suggested by neurology but I told her my opinion I would avoid this since it would just lead to long-term  Regular narcotic use.  Review of Systems 14 point update unchanged from 08/18/2019.   Objective: Vital Signs: Ht 6' (1.829 m)   Wt 177 lb (80.3 kg)   LMP 11/05/2012   BMI 24.01 kg/m   Physical Exam Constitutional:      Appearance: She is well-developed.  HENT:     Head: Normocephalic.     Right Ear: External ear normal.     Left Ear: External ear normal.  Eyes:     Pupils: Pupils are equal, round, and reactive to light.  Neck:     Thyroid: No thyromegaly.     Trachea: No tracheal deviation.  Cardiovascular:     Rate and Rhythm: Normal rate.  Pulmonary:     Effort: Pulmonary effort is normal.  Abdominal:     Palpations: Abdomen is soft.  Skin:    General: Skin is warm and dry.  Neurological:     Mental Status: She is alert and oriented to person, place, and time.  Psychiatric:        Behavior: Behavior normal.     Ortho Exam arm shunts noted.  She can extend her knees without extension lag but has significant bilateral quad weakness.  Normal hip passive range of motion.  Knees reach full extension.  Specialty Comments:  No specialty comments available.  Imaging: CLINICAL DATA:  Bilateral lower extremity weakness. Low back pain and right hip pain for 2 months.  EXAM: MRI LUMBAR SPINE WITHOUT CONTRAST  TECHNIQUE: Multiplanar, multisequence MR imaging of the lumbar spine was performed. No intravenous contrast was administered.  COMPARISON:  Lumbar spine radiograph 08/18/2019  FINDINGS: Segmentation: 5 non rib-bearing lumbar type vertebral bodies are present. The lowest fully formed vertebral body is  L5.  Alignment: No significant listhesis is present. There is some straightening of the normal lumbar lordosis.  Vertebrae: Mixed edematous and fatty endplate marrow changes are present at L4-5 and to a lesser extent at L5-S1. Changes at L5-S1 are predominantly on the right. Vertebral body heights are maintained. There is diffuse loss of T1 marrow signal otherwise.  Conus medullaris and cauda equina: Conus extends to the T12-L1 level. Conus and cauda equina appear normal.  Paraspinal and other soft tissues: Bilateral renal atrophy is present. Left renal cysts are noted. No other solid lesions are present. No significant adenopathy is present.  Disc levels:  T12-L1: Negative.  L1-2: Mild disc bulging and facet hypertrophy is present. No significant stenosis is present.  L2-3: A broad-based disc protrusion is present. Mild facet hypertrophy is evident bilaterally. No significant stenosis is present.  L3-4: A broad-based disc protrusion is present. Moderate facet hypertrophy is present bilaterally. No significant stenosis is evident.  L4-5: A broad-based disc protrusion is present. Moderate facet hypertrophy is noted bilaterally. Mild subarticular stenosis is present bilaterally. The disc extends into the foramina with mild bilateral foraminal narrowing.  L5-S1: A rightward broad-based disc protrusion contributes to moderate right foraminal stenosis. Mild right subarticular narrowing is present. Mild left foraminal narrowing is present.  IMPRESSION: 1. Moderate right foraminal and mild right subarticular narrowing at L5-S1 secondary to a rightward broad-based disc protrusion and bilateral facet hypertrophy. 2. Mild left foraminal narrowing at L5-S1. 3. Mild subarticular and foraminal narrowing bilaterally at L4-5. 4. Broad-based disc protrusion and moderate bilateral facet hypertrophy at L3-4 without significant stenosis. 5. Diffuse loss of T1 marrow signal  is nonspecific, but can be seen in the setting of chronic anemia, smoking, or lung disease most commonly.   Electronically Signed   By: San Morelle M.D.   On: 09/14/2019 11:18   PMFS History: Patient Active Problem List   Diagnosis Date Noted  . Leg weakness, bilateral 08/19/2019  . GERD (gastroesophageal reflux disease) 01/27/2019  . Secondary hypoparathyroidism ([place]) 06/11/2018  . Hypomagnesemia 06/11/2018  . Hypotension 06/11/2018  . Abnormal laboratory test   . Hypokalemia   . Pleuritic chest pain 06/01/2018  . Diarrhea due to drug 02/04/2018  . Hyperlipidemia 02/12/2017  . Preventative health care 08/12/2012  . Tobacco abuse 04/30/2012  . S/p cadaver renal transplant 11/19/2011  . Type II diabetes mellitus, well controlled ([place]) 11/04/2011  . HTN (hypertension) 11/03/2011   Past Medical History:  Diagnosis Date  . Blood transfusion without reported diagnosis   . 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 GERD (gastroesophageal reflux disease)    medication induced  . Hypertension   . Renal failure    hx of  . 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 Hypertension Mother   . Diabetes Mother   . Atrial fibrillation Father   . Hypertension Father   . Arthritis Father   . Heart failure Sister   . Hypertension Sister        all 4 sisters  . Hypertension Brother        both brothers  . Diabetes Brother   . Colon cancer Neg Hx   . Stomach cancer Neg Hx     Past Surgical History:  Procedure Laterality Date  . [place]  . CHOLECYSTECTOMY  2008  . COLONOSCOPY  10 years ago   Dr.Hung="normal"  . DG AV  DIALYSIS  SHUNT ACCESS EXIST*L* OR    . KIDNEY TRANSPLANT  2011  . UMBILICAL HERNIA REPAIR  2009   Social History   Occupational History    Employer: UNEMPLOYED  Tobacco Use  . Smoking status: Current Every Day Smoker    Packs/day: 0.50    Types: Cigarettes    Last attempt to quit: 11/05/2017    Years since quitting: 1.8  . Smokeless tobacco: Never Used  . Tobacco comment: 09/07/19  5-10 a day  Substance and Sexual Activity  . Alcohol use: Never    Alcohol/week: 0.0 standard drinks  . Drug use: Never  . Sexual activity: Never

## 2019-09-20 ENCOUNTER — Encounter: Payer: Self-pay | Admitting: Physical Therapy

## 2019-09-20 ENCOUNTER — Ambulatory Visit: Payer: Medicare Other | Admitting: Physical Therapy

## 2019-09-20 ENCOUNTER — Telehealth: Payer: Self-pay | Admitting: *Deleted

## 2019-09-20 ENCOUNTER — Other Ambulatory Visit: Payer: Self-pay

## 2019-09-20 DIAGNOSIS — M545 Low back pain: Secondary | ICD-10-CM | POA: Diagnosis not present

## 2019-09-20 DIAGNOSIS — G8929 Other chronic pain: Secondary | ICD-10-CM

## 2019-09-20 DIAGNOSIS — R262 Difficulty in walking, not elsewhere classified: Secondary | ICD-10-CM | POA: Diagnosis not present

## 2019-09-20 DIAGNOSIS — M6281 Muscle weakness (generalized): Secondary | ICD-10-CM

## 2019-09-20 NOTE — Therapy (Signed)
[address] [place], [place], [address] Phone: [phone]   Fax:  [phone]  Physical Therapy Treatment  Patient Details  Name: [name] MRN: [id number] Date of Birth: Nov 10, 1965 Referring Provider (PT): Dr Rodell Perna   Encounter Date: 09/20/2019  PT End of Session - 09/20/19 1057    Visit Number  3    Number of Visits  12    Date for PT Re-Evaluation  10/18/19    Authorization Type  MCR/MCD    PT Start Time  1057    PT Stop Time  1148    PT Time Calculation (min)  51 min    Activity Tolerance  Patient tolerated treatment well    Behavior During Therapy  [facility] for tasks assessed/performed       Past Medical History:  Diagnosis Date  . Blood transfusion without reported diagnosis   . 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 GERD (gastroesophageal reflux disease)    medication induced  . Hypertension   . Renal failure    hx of  . 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 [place]  . CHOLECYSTECTOMY  2008  . COLONOSCOPY  10 years ago   Dr.Hung="normal"  . DG AV DIALYSIS  SHUNT ACCESS EXIST*L* OR    . KIDNEY TRANSPLANT  2011  . UMBILICAL HERNIA REPAIR  2009    There were no vitals filed for this visit.  Subjective Assessment - 09/20/19 1057    Subjective  Pt states she felt good after her last visit, was able to go up the stair easier however today is a rough day.    Patient Stated Goals  reduce pain and allow her to not need surgery.    Currently in Pain?  Yes    Pain Score  4     Pain Location  Back    Pain Orientation  Right                        [facility] Adult PT Treatment/Exercise - 09/20/19 0001      Lumbar Exercises: Aerobic   Nustep  L3x6', LE only      Lumbar Exercises: Supine   Clam  20 reps   with red band, bilat then 10 reps single   Bridge  10 reps    Isometric Hip Flexion  10 reps;3 seconds    Isometric Hip Flexion Limitations  same side and opposite    Other Supine Lumbar Exercises  SAQ with 5# 3x10      Modalities   Modalities  Electrical Stimulation;Moist Heat      Moist Heat Therapy   Number Minutes Moist Heat  10 Minutes    Moist Heat Location  Lumbar Spine      Electrical Stimulation   Electrical Stimulation Location  low back    Electrical Stimulation Action  IFC seated    Electrical Stimulation Parameters  to tolearnce    Electrical Stimulation Goals  Pain  PT Long Term Goals - 09/20/19 1103      PT LONG TERM GOAL #1   Title  I with advanced HEP (10/18/2019)    Status  On-going      PT LONG TERM GOAL #2   Title  transfer sit to stand without needing UE assist ( 10/18/2019)    Status  On-going      PT LONG TERM GOAL #3   Title  ambulate in her house without an assistive device on level surface and railing on stairs ( 10/18/2019)    Status  On-going      PT LONG TERM GOAL #4   Title  increase ODI =/> 21/50, 42% limited ( 10/18/2019)    Status  On-going      PT LONG TERM GOAL #5   Title  Pt report =/> 50% reduction of pain with ambulation and daily activity ( 10/18/2019)    Status  On-going            Plan - 09/20/19 1128    Clinical Impression Statement  [name] is tolerating her HEP progression for lower body strengthening using the red band.  Still very weak in her core and legs.  Difficulty with transfers - requires UE assist and with walking.  She does fatigue quickly and needs rest breaks. She reports that her back does not bother her as much as prior to therapy.    Examination-Activity Limitations  Bathing;Locomotion  Level;Transfers;Bed Mobility;Bend;Sit;Sleep;Dressing;Stairs;Hygiene/Grooming;Stand;Toileting    Rehab Potential  Good    PT Frequency  2x / week    PT Duration  6 weeks    PT Treatment/Interventions  Iontophoresis 4mg /ml Dexamethasone;Gait training;Stair training;Functional mobility training;Patient/family education;Therapeutic activities;Ultrasound;Traction;Moist Heat;Cryotherapy;Electrical Stimulation;Therapeutic exercise;Neuromuscular re-education;Manual techniques;Dry needling    PT Next Visit Plan  lower body strengthening, gait and balance       Patient will benefit from skilled therapeutic intervention in order to improve the following deficits and impairments:  Decreased range of motion, Difficulty walking, Pain, Impaired perceived functional ability, Decreased strength, Decreased mobility  Visit Diagnosis: Chronic bilateral low back pain, unspecified whether sciatica present  Muscle weakness (generalized)  Difficulty in walking, not elsewhere classified     Problem List Patient Active Problem List   Diagnosis Date Noted  . Leg weakness, bilateral 08/19/2019  . GERD (gastroesophageal reflux disease) 01/27/2019  . Secondary hypoparathyroidism ([place]) 06/11/2018  . Hypomagnesemia 06/11/2018  . Hypotension 06/11/2018  . Abnormal laboratory test   . Hypokalemia   . Pleuritic chest pain 06/01/2018  . Diarrhea due to drug 02/04/2018  . Hyperlipidemia 02/12/2017  . Preventative health care 08/12/2012  . Tobacco abuse 04/30/2012  . S/p cadaver renal transplant 11/19/2011  . Type II diabetes mellitus, well controlled ([place]) 11/04/2011  . HTN (hypertension) 11/03/2011    [name] 09/20/2019, 11:36 AM  [address] [place], [place], [address] Phone: [phone]   Fax:  [phone]  Name: [name] MRN: [id number] Date of Birth: 07/17/1965

## 2019-09-20 NOTE — Telephone Encounter (Signed)
LVM with lab results on 09/13/19.

## 2019-09-22 ENCOUNTER — Encounter: Payer: Self-pay | Admitting: Physical Therapy

## 2019-09-22 ENCOUNTER — Ambulatory Visit: Payer: Medicare Other | Admitting: Physical Therapy

## 2019-09-22 ENCOUNTER — Other Ambulatory Visit: Payer: Self-pay

## 2019-09-22 DIAGNOSIS — R262 Difficulty in walking, not elsewhere classified: Secondary | ICD-10-CM

## 2019-09-22 DIAGNOSIS — M6281 Muscle weakness (generalized): Secondary | ICD-10-CM | POA: Diagnosis not present

## 2019-09-22 DIAGNOSIS — G8929 Other chronic pain: Secondary | ICD-10-CM

## 2019-09-22 DIAGNOSIS — M545 Low back pain: Secondary | ICD-10-CM | POA: Diagnosis not present

## 2019-09-22 NOTE — Therapy (Signed)
[address] [place], [place], [address] Phone: [phone]   Fax:  [phone]  Physical Therapy Treatment  Patient Details  Name: [name] MRN: [id number] Date of Birth: [birth year] Referring Provider (PT): Dr Rodell Perna   Encounter Date: 09/22/2019  PT End of Session - 09/22/19 1100    Visit Number  4    Number of Visits  12    Date for PT Re-Evaluation  10/18/19    Authorization Type  MCR/MCD    PT Start Time  1057    PT Stop Time  1151    PT Time Calculation (min)  54 min    Activity Tolerance  Patient limited by pain    Behavior During Therapy  Anxious       Past Medical History:  Diagnosis Date  . Blood transfusion without reported diagnosis   . 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 GERD (gastroesophageal reflux disease)    medication induced  . Hypertension   . Renal failure    hx of  . 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 [place]  . CHOLECYSTECTOMY  2008  . COLONOSCOPY  10 years ago   Dr.Hung="normal"  . DG AV DIALYSIS  SHUNT ACCESS EXIST*L* OR    . KIDNEY TRANSPLANT  2011  . UMBILICAL HERNIA REPAIR  2009    There were no vitals filed for this visit.  Subjective Assessment - 09/22/19 1100    Subjective  Pt reports she is having pain every where today.  Not able to walk back to clinic.    Currently in Pain?  Yes    Pain Score  9    all over                      [facility] Adult PT Treatment/Exercise - 09/22/19 0001      Transfers   Sit to Stand  With upper extremity assist;4: Min assist    Sit to  Stand Details  --   increased time     Lumbar Exercises: Stretches   Active Hamstring Stretch  Left;Right;3 reps;10 seconds   slow and controlled   Single Knee to Chest Stretch  Left;Right   PROM stretching by therapist   Piriformis Stretch  Left;Right   very gently by therapist   Other Lumbar Stretch Exercise  butterfly stetch in hooklying      Lumbar Exercises: Aerobic   Nustep  L1x6' arms only   less resistance d/t pain     Lumbar Exercises: Seated   Other Seated Lumbar Exercises  5 reps UE isometrics ER/IR       Lumbar Exercises: Supine   Ab Set  10 reps;5 seconds    Glut Set  10 reps;5 seconds    Other Supine Lumbar Exercises  10x5sec quad sets      Modalities   Modalities  Electrical Stimulation;Moist Heat      Moist Heat Therapy   Number Minutes Moist Heat  15 Minutes    Moist Heat Location  Lumbar Spine  Acupuncturist Location  low back    Electrical Stimulation Action  IFC seated     Electrical Stimulation Parameters  to tolerance    Electrical Stimulation Goals  Pain                  PT Long Term Goals - 09/20/19 1103      PT LONG TERM GOAL #1   Title  I with advanced HEP (10/18/2019)    Status  On-going      PT LONG TERM GOAL #2   Title  transfer sit to stand without needing UE assist ( 10/18/2019)    Status  On-going      PT LONG TERM GOAL #3   Title  ambulate in her house without an assistive device on level surface and railing on stairs ( 10/18/2019)    Status  On-going      PT LONG TERM GOAL #4   Title  increase ODI =/> 21/50, 42% limited ( 10/18/2019)    Status  On-going      PT LONG TERM GOAL #5   Title  Pt report =/> 50% reduction of pain with ambulation and daily activity ( 10/18/2019)    Status  On-going            Plan - 09/22/19 1111    Clinical Impression Statement  Pt presented today in a lot of pain all over.  Focus was on isometric activation of her muscles to try and activate  them, stabilize and reduce pain.    Examination-Activity Limitations  Bathing;Locomotion Level;Transfers;Bed Mobility;Bend;Sit;Sleep;Dressing;Stairs;Hygiene/Grooming;Stand;Toileting    Rehab Potential  Good    PT Frequency  2x / week    PT Treatment/Interventions  Iontophoresis 4mg /ml Dexamethasone;Gait training;Stair training;Functional mobility training;Patient/family education;Therapeutic activities;Ultrasound;Traction;Moist Heat;Cryotherapy;Electrical Stimulation;Therapeutic exercise;Neuromuscular re-education;Manual techniques;Dry needling    PT Next Visit Plan  lower body strengthening, gait and balance       Patient will benefit from skilled therapeutic intervention in order to improve the following deficits and impairments:  Decreased range of motion, Difficulty walking, Pain, Impaired perceived functional ability, Decreased strength, Decreased mobility  Visit Diagnosis: Chronic bilateral low back pain, unspecified whether sciatica present  Muscle weakness (generalized)  Difficulty in walking, not elsewhere classified     Problem List Patient Active Problem List   Diagnosis Date Noted  . Leg weakness, bilateral 08/19/2019  . GERD (gastroesophageal reflux disease) 01/27/2019  . Secondary hypoparathyroidism ([place]) 06/11/2018  . Hypomagnesemia 06/11/2018  . Hypotension 06/11/2018  . Abnormal laboratory test   . Hypokalemia   . Pleuritic chest pain 06/01/2018  . Diarrhea due to drug 02/04/2018  . Hyperlipidemia 02/12/2017  . Preventative health care 08/12/2012  . Tobacco abuse 04/30/2012  . S/p cadaver renal transplant 11/19/2011  . Type II diabetes mellitus, well controlled ([place]) 11/04/2011  . HTN (hypertension) 11/03/2011    Jeral Pinch PT  09/22/2019, 11:42 AM  [address] [place], [place], [address] Phone: [phone]   Fax:  [phone]  Name: [name] MRN: [id number] Date of  Birth: 03/07/1966

## 2019-09-27 ENCOUNTER — Encounter: Payer: Medicare Other | Admitting: Physical Therapy

## 2019-09-28 DIAGNOSIS — M545 Low back pain: Secondary | ICD-10-CM | POA: Diagnosis not present

## 2019-09-28 DIAGNOSIS — R748 Abnormal levels of other serum enzymes: Secondary | ICD-10-CM | POA: Diagnosis not present

## 2019-09-28 DIAGNOSIS — E559 Vitamin D deficiency, unspecified: Secondary | ICD-10-CM | POA: Diagnosis not present

## 2019-09-28 DIAGNOSIS — M150 Primary generalized (osteo)arthritis: Secondary | ICD-10-CM | POA: Diagnosis not present

## 2019-09-28 DIAGNOSIS — M6281 Muscle weakness (generalized): Secondary | ICD-10-CM | POA: Diagnosis not present

## 2019-09-28 DIAGNOSIS — M2550 Pain in unspecified joint: Secondary | ICD-10-CM | POA: Diagnosis not present

## 2019-09-28 DIAGNOSIS — E538 Deficiency of other specified B group vitamins: Secondary | ICD-10-CM | POA: Diagnosis not present

## 2019-09-28 DIAGNOSIS — M791 Myalgia, unspecified site: Secondary | ICD-10-CM | POA: Diagnosis not present

## 2019-09-29 ENCOUNTER — Ambulatory Visit: Payer: Medicare Other | Admitting: Physical Therapy

## 2019-09-29 ENCOUNTER — Encounter: Payer: Self-pay | Admitting: Physical Therapy

## 2019-09-29 ENCOUNTER — Other Ambulatory Visit: Payer: Self-pay

## 2019-09-29 DIAGNOSIS — G8929 Other chronic pain: Secondary | ICD-10-CM | POA: Diagnosis not present

## 2019-09-29 DIAGNOSIS — M6281 Muscle weakness (generalized): Secondary | ICD-10-CM

## 2019-09-29 DIAGNOSIS — R262 Difficulty in walking, not elsewhere classified: Secondary | ICD-10-CM | POA: Diagnosis not present

## 2019-09-29 DIAGNOSIS — M545 Low back pain: Secondary | ICD-10-CM | POA: Diagnosis not present

## 2019-09-29 NOTE — Therapy (Signed)
[place], [place], [address] Phone: [phone]   Fax:  [phone]  Physical Therapy Treatment  Patient Details  Name: [name] MRN: [id number] Date of Birth: June 24, 1966 Referring Provider (PT): Dr Rodell Perna   Encounter Date: 09/29/2019  PT End of Session - 09/29/19 1042    Visit Number  5    Number of Visits  12    Date for PT Re-Evaluation  10/18/19    PT Start Time  1005    PT Stop Time  1052    PT Time Calculation (min)  47 min    Activity Tolerance  Patient limited by pain    Behavior During Therapy  Anxious       Past Medical History:  Diagnosis Date  . Blood transfusion without reported diagnosis   . 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 GERD (gastroesophageal reflux disease)    medication induced  . Hypertension   . Renal failure    hx of  . 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 [place]  . CHOLECYSTECTOMY  2008  . COLONOSCOPY  10 years ago   Dr.Hung="normal"  . DG AV DIALYSIS  SHUNT ACCESS EXIST*L* OR    . KIDNEY TRANSPLANT  2011  . UMBILICAL HERNIA REPAIR  2009    There were no vitals filed for this visit.  Subjective Assessment - 09/29/19 1041    Subjective  Patient had to be brough back to the clinic in a w/c due to pain levels    Currently in Pain?  Yes    Pain Score  9     Pain Location  Back    Aggravating Factors   getting in and out of bed, sit to stand                       [facility] Adult PT Treatment/Exercise - 09/29/19 0001      Lumbar Exercises: Supine   Ab Set  10 reps;5  seconds    Glut Set  10 reps;5 seconds    Clam  20 reps    Large Ball Abdominal Isometric  20 reps;1 second    Other Supine Lumbar Exercises  SAQ 2.5# 2x10    Other Supine Lumbar Exercises  2# chest press, red tband triceps press      Modalities   Modalities  Electrical Stimulation;Moist Heat      Moist Heat Therapy   Number Minutes Moist Heat  15 Minutes    Moist Heat Location  Lumbar Spine      Electrical Stimulation   Electrical Stimulation Location  low back    Electrical Stimulation Action  IFC    Electrical Stimulation Parameters  sitting    Electrical Stimulation Goals  Pain                  PT Long Term Goals - 09/29/19 1044      PT LONG TERM GOAL #1   Title  I with advanced HEP (10/18/2019)    Status  On-going  PT LONG TERM GOAL #2   Title  transfer sit to stand without needing UE assist ( 10/18/2019)    Status  On-going      PT LONG TERM GOAL #3   Title  ambulate in her house without an assistive device on level surface and railing on stairs ( 10/18/2019)    Status  Partially Met      PT LONG TERM GOAL #4   Title  increase ODI =/> 21/50, 42% limited ( 10/18/2019)    Status  On-going      PT LONG TERM GOAL #5   Title  Pt report =/> 50% reduction of pain with ambulation and daily activity ( 10/18/2019)    Status  On-going            Plan - 09/29/19 1042    Clinical Impression Statement  Patient has really had some increaesd pain lately, I did all exercises with her in supine with bolster behind the knees, she did very well with little increaes of pain, the issue is her getting back up, this causes significant pain to 9-10/10 with these transitional movements    PT Next Visit Plan  try to keep her moving and staying strong, may have to experiment with seated exercises to avoid the transitional motions that increase her pain significantly    Consulted and Agree with Plan of Care  Patient       Patient will benefit from skilled therapeutic  intervention in order to improve the following deficits and impairments:  Decreased range of motion, Difficulty walking, Pain, Impaired perceived functional ability, Decreased strength, Decreased mobility  Visit Diagnosis: Chronic bilateral low back pain, unspecified whether sciatica present  Muscle weakness (generalized)  Difficulty in walking, not elsewhere classified     Problem List Patient Active Problem List   Diagnosis Date Noted  . Leg weakness, bilateral 08/19/2019  . GERD (gastroesophageal reflux disease) 01/27/2019  . Secondary hypoparathyroidism ([place]) 06/11/2018  . Hypomagnesemia 06/11/2018  . Hypotension 06/11/2018  . Abnormal laboratory test   . Hypokalemia   . Pleuritic chest pain 06/01/2018  . Diarrhea due to drug 02/04/2018  . Hyperlipidemia 02/12/2017  . Preventative health care 08/12/2012  . Tobacco abuse 04/30/2012  . S/p cadaver renal transplant 11/19/2011  . Type II diabetes mellitus, well controlled ([place]) 11/04/2011  . HTN (hypertension) 11/03/2011    Sumner Boast., PT 09/29/2019, 10:46 AM  [address] [address] [place], [place], [address] Phone: [phone]   Fax:  [phone]  Name: [name] MRN: [id number] Date of Birth: Jun 10, 1966

## 2019-10-01 ENCOUNTER — Ambulatory Visit (HOSPITAL_COMMUNITY)
Admission: RE | Admit: 2019-10-01 | Discharge: 2019-10-01 | Disposition: A | Discharge status: Home / Self Care | Payer: Medicare Other | Source: Ambulatory Visit | Attending: Rheumatology | Admitting: Rheumatology

## 2019-10-01 ENCOUNTER — Other Ambulatory Visit: Payer: Self-pay

## 2019-10-01 ENCOUNTER — Encounter (HOSPITAL_COMMUNITY)
Admission: RE | Admit: 2019-10-01 | Discharge: 2019-10-01 | Disposition: A | Discharge status: Home / Self Care | Payer: Medicare Other | Source: Ambulatory Visit | Attending: Rheumatology | Admitting: Rheumatology

## 2019-10-01 DIAGNOSIS — R748 Abnormal levels of other serum enzymes: Secondary | ICD-10-CM | POA: Diagnosis not present

## 2019-10-01 DIAGNOSIS — M549 Dorsalgia, unspecified: Secondary | ICD-10-CM | POA: Insufficient documentation

## 2019-10-01 MED ORDER — TECHNETIUM TC 99M MEDRONATE IV KIT
20.0000 | PACK | Freq: Once | INTRAVENOUS | Status: AC | PRN
  Administered 2019-10-01: 21.6 via INTRAVENOUS

## 2019-10-05 ENCOUNTER — Other Ambulatory Visit: Payer: Self-pay | Admitting: Rheumatology

## 2019-10-05 DIAGNOSIS — M899 Disorder of bone, unspecified: Secondary | ICD-10-CM

## 2019-10-11 ENCOUNTER — Ambulatory Visit (INDEPENDENT_AMBULATORY_CARE_PROVIDER_SITE_OTHER): Payer: Medicare Other | Admitting: Internal Medicine

## 2019-10-11 VITALS — BP 185/95 | HR 85 | Wt 173.2 lb

## 2019-10-11 DIAGNOSIS — R948 Abnormal results of function studies of other organs and systems: Secondary | ICD-10-CM

## 2019-10-11 DIAGNOSIS — E538 Deficiency of other specified B group vitamins: Secondary | ICD-10-CM | POA: Insufficient documentation

## 2019-10-11 DIAGNOSIS — I10 Essential (primary) hypertension: Secondary | ICD-10-CM | POA: Diagnosis not present

## 2019-10-11 DIAGNOSIS — M791 Myalgia, unspecified site: Secondary | ICD-10-CM | POA: Diagnosis not present

## 2019-10-11 MED ORDER — VITAMIN B-12 1000 MCG PO TABS: 1000.0000 ug | ORAL_TABLET | Freq: Every day | ORAL | 3 refills | Status: AC

## 2019-10-11 MED ORDER — PREDNISONE 20 MG PO TABS: 20.0000 mg | ORAL_TABLET | Freq: Every day | ORAL | 0 refills | Status: DC

## 2019-10-11 NOTE — Patient Instructions (Signed)
Ms. Vanacker,  It was nice meeting you today!  For your muscle and joint pain - Please begin taking prednisone 20mg  daily. Schedule a telehealth appointment Friday or Monday to follow-up on your pain.  We will obtain a CT of the chest and abdomen to follow-up on the bone scan.  For your B12 deficiency - Please begin taking 1,058mcg supplement daily. We can recheck your B12 levels next month.  Thank you for letting me be a part of your care!

## 2019-10-11 NOTE — Progress Notes (Signed)
   CC: rib, back, and hip pain  HPI:  [name] is a 54 y.o. F with significant PMH as outlined below, who presents for diffuse myalgias and vitamin B12 deficiency. Please see problem based charting for additional information.  Past Medical History:  Diagnosis Date  . Blood transfusion without reported diagnosis   . 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 GERD (gastroesophageal reflux disease)    medication induced  . Hypertension   . Renal failure    hx of  . 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  Respiratory: Negative for cough and shortness of breath.   Cardiovascular: Negative for chest pain and palpitations.  Gastrointestinal: Negative for abdominal pain, diarrhea, nausea and vomiting.  Genitourinary: Negative for dysuria and frequency.  Musculoskeletal: Positive for myalgias (diffuse, worst at ribs, shoulders, and hips).  Skin: Negative.   Neurological: Positive for weakness (generalized). Negative for sensory change.   Physical Exam:  Vitals:   10/11/19 0853  BP: (!) 180/92  Pulse: 78  SpO2: 100%  Weight: 173 lb 3.2 oz (78.6 kg)   Physical Exam Vitals and nursing note reviewed.  Constitutional:      General: She is not in acute distress.    Appearance: Normal appearance. She is normal weight.     Comments: Pt sitting in wheelchair in examination room.  Musculoskeletal:     Right lower leg: No edema.     Left lower leg: No edema.     Comments: Tenderness to palpation of bilateral shoulder and L sided lower ribs, 7-10. Tender over L trochanteric bursa. Without gross deformities,  overlying skin changes, or associated swelling. Limited ROM in bilateral shoulders, full ROM otherwise  Skin:    General: Skin is warm and dry.     Findings: No erythema or rash.  Neurological:     Mental Status: She is alert.     Comments: Strength examination limited secondary to pain. 3/5 strength in proximal muscle groups of upper and lower extremities. Distal groups 5/5.  Psychiatric:        Behavior: Behavior is agitated.    Assessment & Plan:   See Encounters Tab for problem based charting.  Patient discussed with Dr. Lynnae January

## 2019-10-12 NOTE — Assessment & Plan Note (Addendum)
Pt presents for follow-up of prolonged myalgias and diffuse muscle weakness since hospitalization in 2019 for pleuritic chest pain. Previously seen by orthopedics and neurology for lower extremities weakness. Work-up unremarkable aside from Vit B12 deficiency and MRI of the L spine with moderate R foraminal narrowing at L5-S1 due to disc protrusion, mild L narrowing at L5-S1, mild bilateral foraminal narrowing at L4-5, and disc protrusion at L3-4 without significant stenosis at any level. Last seen by rheumatology in Jan 2021, who were concerned for a myositis vs polymyalgia rheumatica. Autoimmune serologies and myositis labs unremarkable then. Pt was treated with 6 day prednisone taper.  Today, pt continues to have pain in her joints and muscles, stating that it can vary day to day in location and pain quantity. She endorses progressive weakness in her proximal muscle groups, having to use her arms to lift her legs to shift out of the wheelchair/car. Pt says that the prednisone has given her the most relief in treatments so far and a course of tramadol did nothing to help her pain. On examination, pt's strength testing is limited by pain, but is at least 3/5 in proximal muscle groups and 5/5 in distal groups of both upper and lower extremities. Also has diffused tenderness to palpation of L sided ribs and L trochanteric bursa. No joint deformities or skin lesions or rashes.  Of note, pt had a whole body NM bone scan on 3/29 for an elevated alk phos, which showed diffuse uptake throughout the axial and appendicular skeleton, similar to prior study, with new multifocal bilateral areas of focal rib uptake suspicious for metastatic disease.   - start prednisone 50m daily for possible PMR to assess pain/treament response - continue rheum follow-up - CT chest already ordered by rheum to evaluate rib uptake seen on bone scan - given suspicion for underlying malignancy, will add CT abdomen/pelvis as well -  telehealth follow-up in 1 week, and in person follow-up with PCP in 1 month

## 2019-10-12 NOTE — Assessment & Plan Note (Signed)
Pt noted to have low vitamin B12 level on lab work last month done by orthopedics for bilateral leg weakness. She has not been taking over the counter supplementation. Discussed daily B12 supplementation as deficiency could be playing a role in her muscle weakness.  - prescribed 1020mcg B12 daily - would recheck B12 level at follow-up to ensure pt responding - could consider IM supplementation if needed   Lab Results  Component Value Date   VITAMINB12 213 (L) 09/07/2019

## 2019-10-12 NOTE — Assessment & Plan Note (Signed)
Pt endorses taking amlodipine 10mg  daily and labetalol 200mg  BID. BP reading today 185/95. Suspect elevated reading secondary to pt's diffuse pain.  - will continue to monitor - if persistent, pt may need to resume clonidine TID  BP Readings from Last 3 Encounters:  10/11/19 (!) 185/95  09/07/19 (!) 150/80  08/18/19 (!) 147/87

## 2019-10-13 ENCOUNTER — Telehealth: Payer: Self-pay | Admitting: *Deleted

## 2019-10-13 NOTE — Progress Notes (Signed)
Internal Medicine Clinic Attending  Case discussed with Dr. [name] at the time of the visit.  We reviewed the resident's history and exam and pertinent patient test results.  I agree with the assessment, diagnosis, and plan of care documented in the resident's note.    

## 2019-10-13 NOTE — Telephone Encounter (Addendum)
Information was called to [facility] for [place] for Prednisone 20 mg tablets. More information to  be sent for completion and review. Case # [id number]  Sander Nephew, RN 10/13/2019 10:33 AM.  Joylene Igo from [employer] Prednisone was approved 07/16/2019 thru 10/13/2020. 4:39 PM

## 2019-10-20 ENCOUNTER — Other Ambulatory Visit: Payer: Self-pay | Admitting: Rheumatology

## 2019-10-20 DIAGNOSIS — M899 Disorder of bone, unspecified: Secondary | ICD-10-CM

## 2019-10-21 ENCOUNTER — Ambulatory Visit
Admission: RE | Admit: 2019-10-21 | Discharge: 2019-10-21 | Disposition: A | Discharge status: Home / Self Care | Payer: Medicare Other | Source: Ambulatory Visit | Attending: Rheumatology | Admitting: Rheumatology

## 2019-10-21 ENCOUNTER — Other Ambulatory Visit: Payer: Self-pay

## 2019-10-21 ENCOUNTER — Ambulatory Visit
Admission: RE | Admit: 2019-10-21 | Discharge: 2019-10-21 | Disposition: A | Discharge status: Home / Self Care | Payer: Medicare Other | Source: Ambulatory Visit | Attending: Internal Medicine | Admitting: Internal Medicine

## 2019-10-21 DIAGNOSIS — R0789 Other chest pain: Secondary | ICD-10-CM | POA: Diagnosis not present

## 2019-10-21 DIAGNOSIS — N2889 Other specified disorders of kidney and ureter: Secondary | ICD-10-CM | POA: Diagnosis not present

## 2019-10-21 DIAGNOSIS — R948 Abnormal results of function studies of other organs and systems: Secondary | ICD-10-CM

## 2019-10-21 DIAGNOSIS — M899 Disorder of bone, unspecified: Secondary | ICD-10-CM

## 2019-11-14 ENCOUNTER — Encounter: Payer: Self-pay | Admitting: Internal Medicine

## 2019-11-14 DIAGNOSIS — R918 Other nonspecific abnormal finding of lung field: Secondary | ICD-10-CM | POA: Insufficient documentation

## 2019-11-16 ENCOUNTER — Encounter: Payer: Self-pay | Admitting: Internal Medicine

## 2019-11-16 ENCOUNTER — Ambulatory Visit (INDEPENDENT_AMBULATORY_CARE_PROVIDER_SITE_OTHER): Payer: Medicare Other | Admitting: Internal Medicine

## 2019-11-16 ENCOUNTER — Other Ambulatory Visit: Payer: Self-pay

## 2019-11-16 VITALS — BP 144/84 | HR 73 | Temp 98.3°F | Ht 71.0 in | Wt 179.6 lb

## 2019-11-16 DIAGNOSIS — M51369 Other intervertebral disc degeneration, lumbar region without mention of lumbar back pain or lower extremity pain: Secondary | ICD-10-CM

## 2019-11-16 DIAGNOSIS — R29898 Other symptoms and signs involving the musculoskeletal system: Secondary | ICD-10-CM | POA: Diagnosis not present

## 2019-11-16 DIAGNOSIS — E119 Type 2 diabetes mellitus without complications: Secondary | ICD-10-CM

## 2019-11-16 DIAGNOSIS — M791 Myalgia, unspecified site: Secondary | ICD-10-CM | POA: Diagnosis not present

## 2019-11-16 DIAGNOSIS — E538 Deficiency of other specified B group vitamins: Secondary | ICD-10-CM

## 2019-11-16 DIAGNOSIS — M5136 Other intervertebral disc degeneration, lumbar region: Secondary | ICD-10-CM

## 2019-11-16 DIAGNOSIS — Z940 Kidney transplant status: Secondary | ICD-10-CM | POA: Diagnosis not present

## 2019-11-16 DIAGNOSIS — R531 Weakness: Secondary | ICD-10-CM | POA: Diagnosis not present

## 2019-11-16 MED ORDER — DULOXETINE HCL 30 MG PO CPEP: 30.0000 mg | ORAL_CAPSULE | Freq: Every day | ORAL | 0 refills | Status: DC

## 2019-11-16 NOTE — Patient Instructions (Addendum)
Thank you, Ms.Harveen D Sturgell for allowing [place] to provide your care today. Today we discussed rib and hip pain.    I have ordered no labs for you.    I have ordered the following tests: none  I have ordered the following medication/changed the following medications:  1. Cymbalt 30 mg daily 2. B12 over the counter, 1,000 mcg daily  Please follow-up in 3-4 weeks with your PCP.    Should you have any questions or concerns please call the internal medicine clinic at [phone].    Marianna Payment, D.O. [facility]

## 2019-11-17 ENCOUNTER — Encounter: Payer: Self-pay | Admitting: Internal Medicine

## 2019-11-17 MED ORDER — PREDNISONE 2.5 MG PO TABS: 2.5000 mg | ORAL_TABLET | Freq: Every day | ORAL | 0 refills | Status: AC

## 2019-11-17 NOTE — Progress Notes (Signed)
   CC: Myalgias  HPI:  Ms.Avilene D Shankland is a 54 y.o. female with a past medical history stated below and presents today for a follow up appointment for myalgias and pain. Please see problem based assessment and plan for additional details.   Past Medical History:  Diagnosis Date  . Blood transfusion without reported diagnosis   . 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 GERD (gastroesophageal reflux disease)    medication induced  . Hypertension   . Renal failure    hx of  . 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   Vitals:   11/16/19 1114  BP: (!) 144/84  Pulse: 73  Temp: 98.3 F (36.8 C)  TempSrc: Oral  SpO2: 100%  Weight: 179 lb 9.6 oz (81.5 kg)  Height: 5\' 11"  (1.803 m)     Physical Exam: Physical Exam  Constitutional: She is oriented to person, place, and time and well-developed, well-nourished, and in no distress.  HENT:  Head: Normocephalic and atraumatic.  Eyes: EOM are normal.  Cardiovascular: Normal rate and intact distal pulses. Exam reveals no gallop and no friction rub.  No murmur heard. Pulmonary/Chest: Effort normal and breath sounds normal.  Abdominal: Soft. She exhibits no distension. There is no abdominal tenderness.  Musculoskeletal:        General: Tenderness (bilateral thoracic regions and left hip) present. No edema.     Cervical back: Normal range of motion.  Neurological: She is alert and oriented to person, place, and time. She displays weakness (general). She displays no tremor and normal speech. No cranial nerve deficit or sensory deficit. She exhibits normal muscle  tone. Coordination normal.  Skin: Skin is warm and dry. No rash noted.     Assessment & Plan:   See Encounters Tab for problem based charting.  Patient discussed with Dr. Evette Doffing

## 2019-11-17 NOTE — Assessment & Plan Note (Signed)
Patient has borderline low B12. Although this is unlikely be symptomatic, I will recommend oral B12 replacement as she is currently experiencing myalgias and weakness.   Plan: - OTC B12 daily - Recheck B12 in a couple months.

## 2019-11-17 NOTE — Addendum Note (Signed)
Addended by: Lawerance Cruel on: 11/17/2019 02:59 PM   Modules accepted: Orders

## 2019-11-17 NOTE — Assessment & Plan Note (Signed)
Patient's prednisone was recently increased due to concern for PMR. Patient did not experience much improvement on the steroids. I will decrease her back to her immunosuppression dose of prednisone today.  Plan: Prednisone 2.5 mg daily

## 2019-11-17 NOTE — Assessment & Plan Note (Signed)
Patient presents for a follow-up from a recent clinic appointment for diffuse myalgias, diffuse rib pain/left hip pain, and generalized weakness. She has had work ups from rheumatology, neurology, and nephrology in the past two years.   She recently had CT scans of her chest, abdomen and pelvis performed which showed renal osteodystrophy with healing and nondisplaced bilateral rib fractures and nonspecific soft tissue edema around her hip. She denies any recent trauma to these area.   She continues to have significant pain that limits her functionality and quality of life. On exam she is diffusely weak, but able to stand and move all extremity without problem. It appears that her movement and strength is limited by pain and not true weakness.   Through joint decision making, we determined that our goal for today was to find a safe and effective means of decreasing her pain and helping her become more functional. I recommended Cymbalt has she has significant neuropathic pain from several areas on her back.  I told her that I would reach out to her PCP and discuss this case and bring her back in 4 weeks for a PCP appointment.  Plan: - Trial of Cymbalta 30 mg, will titrate up as possible due to her kidney transplant - Follow up in 4 weeks with PCP.

## 2019-11-18 NOTE — Progress Notes (Signed)
Internal Medicine Clinic Attending  Case discussed with Dr. Coe at the time of the visit.  We reviewed the resident's history and exam and pertinent patient test results.  I agree with the assessment, diagnosis, and plan of care documented in the resident's note.    

## 2019-12-02 DIAGNOSIS — N1832 Chronic kidney disease, stage 3b: Secondary | ICD-10-CM | POA: Diagnosis not present

## 2019-12-02 DIAGNOSIS — N189 Chronic kidney disease, unspecified: Secondary | ICD-10-CM | POA: Diagnosis not present

## 2019-12-02 DIAGNOSIS — Z940 Kidney transplant status: Secondary | ICD-10-CM | POA: Diagnosis not present

## 2019-12-16 ENCOUNTER — Other Ambulatory Visit: Payer: Self-pay | Admitting: Internal Medicine

## 2019-12-16 DIAGNOSIS — M5136 Other intervertebral disc degeneration, lumbar region: Secondary | ICD-10-CM

## 2019-12-17 DIAGNOSIS — G4733 Obstructive sleep apnea (adult) (pediatric): Secondary | ICD-10-CM | POA: Diagnosis not present

## 2019-12-17 DIAGNOSIS — Z79899 Other long term (current) drug therapy: Secondary | ICD-10-CM | POA: Diagnosis not present

## 2019-12-17 DIAGNOSIS — R809 Proteinuria, unspecified: Secondary | ICD-10-CM | POA: Diagnosis not present

## 2019-12-17 DIAGNOSIS — I129 Hypertensive chronic kidney disease with stage 1 through stage 4 chronic kidney disease, or unspecified chronic kidney disease: Secondary | ICD-10-CM | POA: Diagnosis not present

## 2019-12-17 DIAGNOSIS — D631 Anemia in chronic kidney disease: Secondary | ICD-10-CM | POA: Diagnosis not present

## 2019-12-17 DIAGNOSIS — E118 Type 2 diabetes mellitus with unspecified complications: Secondary | ICD-10-CM | POA: Diagnosis not present

## 2019-12-17 DIAGNOSIS — I959 Hypotension, unspecified: Secondary | ICD-10-CM | POA: Diagnosis not present

## 2019-12-17 DIAGNOSIS — N1832 Chronic kidney disease, stage 3b: Secondary | ICD-10-CM | POA: Diagnosis not present

## 2019-12-17 DIAGNOSIS — N189 Chronic kidney disease, unspecified: Secondary | ICD-10-CM | POA: Diagnosis not present

## 2019-12-17 DIAGNOSIS — N2581 Secondary hyperparathyroidism of renal origin: Secondary | ICD-10-CM | POA: Diagnosis not present

## 2019-12-17 DIAGNOSIS — Z940 Kidney transplant status: Secondary | ICD-10-CM | POA: Diagnosis not present

## 2019-12-17 DIAGNOSIS — E785 Hyperlipidemia, unspecified: Secondary | ICD-10-CM | POA: Diagnosis not present

## 2020-02-15 ENCOUNTER — Encounter (HOSPITAL_COMMUNITY): Payer: Self-pay | Admitting: *Deleted

## 2020-02-15 ENCOUNTER — Emergency Department (HOSPITAL_COMMUNITY): Payer: Medicare Other

## 2020-02-15 ENCOUNTER — Other Ambulatory Visit: Payer: Self-pay

## 2020-02-15 ENCOUNTER — Emergency Department (HOSPITAL_COMMUNITY)
Admission: EM | Admit: 2020-02-15 | Discharge: 2020-02-16 | Disposition: A | Discharge status: Home / Self Care | Payer: Medicare Other | Attending: Emergency Medicine | Admitting: Emergency Medicine

## 2020-02-15 DIAGNOSIS — Z794 Long term (current) use of insulin: Secondary | ICD-10-CM | POA: Insufficient documentation

## 2020-02-15 DIAGNOSIS — N183 Chronic kidney disease, stage 3 unspecified: Secondary | ICD-10-CM | POA: Diagnosis not present

## 2020-02-15 DIAGNOSIS — I129 Hypertensive chronic kidney disease with stage 1 through stage 4 chronic kidney disease, or unspecified chronic kidney disease: Secondary | ICD-10-CM | POA: Insufficient documentation

## 2020-02-15 DIAGNOSIS — J90 Pleural effusion, not elsewhere classified: Secondary | ICD-10-CM | POA: Diagnosis not present

## 2020-02-15 DIAGNOSIS — R05 Cough: Secondary | ICD-10-CM | POA: Insufficient documentation

## 2020-02-15 DIAGNOSIS — R0602 Shortness of breath: Secondary | ICD-10-CM | POA: Diagnosis not present

## 2020-02-15 DIAGNOSIS — I890 Lymphedema, not elsewhere classified: Secondary | ICD-10-CM | POA: Diagnosis not present

## 2020-02-15 DIAGNOSIS — R062 Wheezing: Secondary | ICD-10-CM | POA: Diagnosis not present

## 2020-02-15 DIAGNOSIS — Z79899 Other long term (current) drug therapy: Secondary | ICD-10-CM | POA: Insufficient documentation

## 2020-02-15 DIAGNOSIS — E1122 Type 2 diabetes mellitus with diabetic chronic kidney disease: Secondary | ICD-10-CM | POA: Insufficient documentation

## 2020-02-15 DIAGNOSIS — F17210 Nicotine dependence, cigarettes, uncomplicated: Secondary | ICD-10-CM | POA: Insufficient documentation

## 2020-02-15 DIAGNOSIS — N189 Chronic kidney disease, unspecified: Secondary | ICD-10-CM | POA: Diagnosis not present

## 2020-02-15 DIAGNOSIS — J811 Chronic pulmonary edema: Secondary | ICD-10-CM | POA: Diagnosis not present

## 2020-02-15 LAB — COMPREHENSIVE METABOLIC PANEL
ALT: 14 U/L (ref 0–44)
AST: 14 U/L — ABNORMAL LOW (ref 15–41)
Albumin: 3 g/dL — ABNORMAL LOW (ref 3.5–5.0)
Alkaline Phosphatase: 1282 U/L — ABNORMAL HIGH (ref 38–126)
Anion gap: 10 (ref 5–15)
BUN: 28 mg/dL — ABNORMAL HIGH (ref 6–20)
CO2: 19 mmol/L — ABNORMAL LOW (ref 22–32)
Calcium: 8.5 mg/dL — ABNORMAL LOW (ref 8.9–10.3)
Chloride: 113 mmol/L — ABNORMAL HIGH (ref 98–111)
Creatinine, Ser: 2.22 mg/dL — ABNORMAL HIGH (ref 0.44–1.00)
GFR calc Af Amer: 28 mL/min — ABNORMAL LOW (ref >60)
GFR calc non Af Amer: 24 mL/min — ABNORMAL LOW (ref >60)
Glucose, Bld: 82 mg/dL (ref 70–99)
Potassium: 4.5 mmol/L (ref 3.5–5.1)
Sodium: 142 mmol/L (ref 135–145)
Total Bilirubin: 0.2 mg/dL — ABNORMAL LOW (ref 0.3–1.2)
Total Protein: 5.1 g/dL — ABNORMAL LOW (ref 6.5–8.1)

## 2020-02-15 LAB — CBC WITH DIFFERENTIAL/PLATELET
Abs Immature Granulocytes: 0 10*3/uL (ref 0.00–0.07)
Band Neutrophils: 0 %
Basophils Absolute: 0 10*3/uL (ref 0.0–0.1)
Basophils Relative: 0 %
Blasts: 0 %
Eosinophils Absolute: 0.1 10*3/uL (ref 0.0–0.5)
Eosinophils Relative: 3 %
HCT: 28.5 % — ABNORMAL LOW (ref 36.0–46.0)
Hemoglobin: 8.4 g/dL — ABNORMAL LOW (ref 12.0–15.0)
Lymphocytes Relative: 11 %
Lymphs Abs: 0.5 10*3/uL — ABNORMAL LOW (ref 0.7–4.0)
MCH: 30.5 pg (ref 26.0–34.0)
MCHC: 29.5 g/dL — ABNORMAL LOW (ref 30.0–36.0)
MCV: 103.6 fL — ABNORMAL HIGH (ref 80.0–100.0)
Metamyelocytes Relative: 0 %
Monocytes Absolute: 0.3 10*3/uL (ref 0.1–1.0)
Monocytes Relative: 8 %
Myelocytes: 0 %
Neutro Abs: 3.2 10*3/uL (ref 1.7–7.7)
Neutrophils Relative %: 78 %
Other: 0 %
Platelets: 145 10*3/uL — ABNORMAL LOW (ref 150–400)
Promyelocytes Relative: 0 %
RBC: 2.75 MIL/uL — ABNORMAL LOW (ref 3.87–5.11)
RDW: 15.9 % — ABNORMAL HIGH (ref 11.5–15.5)
WBC: 4.1 10*3/uL (ref 4.0–10.5)
nRBC: 0 % (ref 0.0–0.2)
nRBC: 0 /100 WBC

## 2020-02-15 LAB — URINALYSIS, ROUTINE W REFLEX MICROSCOPIC
Bilirubin Urine: NEGATIVE
Glucose, UA: NEGATIVE mg/dL
Ketones, ur: NEGATIVE mg/dL
Leukocytes,Ua: NEGATIVE
Nitrite: NEGATIVE
Protein, ur: >=300 mg/dL — AB
Specific Gravity, Urine: 1.016 (ref 1.005–1.030)
pH: 5 (ref 5.0–8.0)

## 2020-02-15 NOTE — ED Triage Notes (Signed)
To ED for eval of generalized body swelling. States she was dx with lymphedema in the past - recently. States her body has progressively started to swell over the past few days. Complains of SOB. States she has to prop herself to sleep at night. Kidney transplant pt 9 yrs ago. Urinating wnl.

## 2020-02-16 DIAGNOSIS — N183 Chronic kidney disease, stage 3 unspecified: Secondary | ICD-10-CM | POA: Diagnosis not present

## 2020-02-16 MED ORDER — FUROSEMIDE 40 MG PO TABS
40.0000 mg | ORAL_TABLET | Freq: Every day | ORAL | 0 refills | Status: DC
Start: 2020-02-16 — End: 2020-03-24

## 2020-02-16 MED ORDER — FUROSEMIDE 10 MG/ML IJ SOLN
40.0000 mg | Freq: Once | INTRAMUSCULAR | Status: AC
  Administered 2020-02-16: 40 mg via INTRAVENOUS
  Filled 2020-02-16: qty 4

## 2020-02-16 NOTE — Discharge Instructions (Signed)
If you develop new or worsening shortness of breath, chest pain, fever, or any other new/concerning symptoms, then return to the ER for evaluation.   Follow up with your nephrologist in about 1 week

## 2020-02-16 NOTE — ED Provider Notes (Addendum)
[facility] EMERGENCY DEPARTMENT Provider Note   CSN: [id number] Arrival date & time: 02/15/20  0901     History Chief Complaint  Patient presents with  . Lymphadenopathy  . body swelling    [name] is a 54 y.o. female.  HPI 54 year old female presents with shortness of breath and swelling.  This has been ongoing for about a month.  She has had gradually worsening diffuse swelling, especially in her legs and abdomen.  She has felt short of breath sometimes when she walks and sometimes when laying flat.  Occasionally she will have to sleep with 2 or 3 pillows but not every night.  Has been having a cough when she gets short of breath.  No chest pain.  Has a history of a kidney transplant.   Past Medical History:  Diagnosis Date  . Blood transfusion without reported diagnosis   . 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 GERD (gastroesophageal reflux disease)    medication induced  . Hypertension   . Renal failure    hx of  . 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 Multiple pulmonary nodules 11/14/2019  . Vitamin B 12 deficiency 10/11/2019  . Myalgia 10/11/2019  . Leg weakness, bilateral 08/19/2019  . GERD (gastroesophageal reflux disease) 01/27/2019  . Secondary hypoparathyroidism ([place]) 06/11/2018  . Hypomagnesemia 06/11/2018  . Hypotension 06/11/2018  . Abnormal laboratory test   . Hypokalemia   . Pleuritic chest pain 06/01/2018  . Diarrhea due to drug 02/04/2018  . Hyperlipidemia 02/12/2017  . Preventative health care 08/12/2012  . Tobacco abuse 04/30/2012  . S/p cadaver renal transplant 11/19/2011  . Type  II diabetes mellitus, well controlled ([place]) 11/04/2011  . HTN (hypertension) 11/03/2011    Past Surgical History:  Procedure Laterality Date  . [place]  . CHOLECYSTECTOMY  2008  . COLONOSCOPY  10 years ago   Dr.Hung="normal"  . DG AV DIALYSIS  SHUNT ACCESS EXIST*L* OR    . KIDNEY TRANSPLANT  2011  . UMBILICAL HERNIA REPAIR  2009     OB History   No obstetric history on file.     Family History  Problem Relation Age of Onset  . Hypertension Mother   . Diabetes Mother   . Atrial fibrillation Father   . Hypertension Father   . Arthritis Father   . Heart failure Sister   . Hypertension Sister        all 4 sisters  . Hypertension Brother        both brothers  . Diabetes Brother   . Colon cancer Neg Hx   . Stomach cancer Neg Hx     Social History   Tobacco Use  . Smoking status: Current Some Day Smoker    Packs/day: 0.50    Types: Cigarettes    Last attempt to quit: 11/05/2017    Years since quitting: 2.2  . Smokeless tobacco: Never Used  . Tobacco comment: 10 some days   Vaping Use  . Vaping Use: Never used  Substance Use Topics  . Alcohol use: Never    Alcohol/week: 0.0  standard drinks  . Drug use: Never    Home Medications Prior to Admission medications   Medication Sig Start Date End Date Taking? Authorizing Provider  amLODipine (NORVASC) 10 MG tablet Take 10 mg by mouth daily.    [provider]  atorvastatin (LIPITOR) 10 MG tablet Take 10 mg by mouth 3 (three) times a week. Taking on Monday, Wednesday, Friday.    [provider]  calcitRIOL (ROCALTROL) 0.25 MCG capsule Take 0.7 mcg by mouth 2 (two) times a day. 04/14/18   [provider]  Calcium-Phosphorus-Vitamin D (CITRACAL +D3 PO) Take 1 tablet by mouth. Mon, Wed, Friday nights only    [provider]  DULoxetine (CYMBALTA) 30 MG capsule TAKE 1 CAPSULE(30 MG) BY MOUTH DAILY 12/16/19   [name], [name], DO  furosemide (LASIX) 40 MG tablet Take 1  tablet (40 mg total) by mouth daily. 02/16/20   Sherwood Gambler, MD  insulin aspart (NOVOLOG) 100 UNIT/ML injection Inject 8-14 Units into the skin as needed for high blood sugar.     [provider]  labetalol (NORMODYNE) 200 MG tablet Take 1 tablet (200 mg total) by mouth 2 (two) times daily. 06/04/18   [name], [name], DO  magnesium oxide (MAG-OX) 400 MG tablet Take 400 mg by mouth 2 (two) times daily.    [provider]  mycophenolate (MYFORTIC) 180 MG EC tablet Take 3 tablets (540 mg total) by mouth 2 (two) times daily. 06/04/18   [name], [name], DO  omeprazole (PRILOSEC) 40 MG capsule Take 40 mg by mouth daily. 08/18/19   [provider]  PROGRAF 1 MG capsule Take 3 mg by mouth 2 (two) times daily.  05/15/17   [provider]  traMADol (ULTRAM) 50 MG tablet Take 1 tablet (50 mg total) by mouth every 6 (six) hours as needed. 09/17/19   Marybelle Killings, MD  vitamin B-12 (CYANOCOBALAMIN) 1000 MCG tablet Take 1 tablet (1,000 mcg total) by mouth daily. 10/11/19   Ladona Horns, MD    Allergies    Ardyth Harps Antonieta Pert dextran], Cefazolin, Ancef [cefazolin sodium], Lisinopril, and Other  Review of Systems   Review of Systems  Respiratory: Positive for cough and shortness of breath.   Cardiovascular: Positive for leg swelling. Negative for chest pain.  All other systems reviewed and are negative.   Physical Exam Updated Vital Signs BP 137/80 (BP Location: Left Arm)   Pulse 64   Temp 98.6 F (37 C) (Oral)   Resp 16   Ht _0  (1.803 m)   LMP 11/05/2012   SpO2 100%   BMI 25.05 kg/m   Physical Exam Vitals and nursing note reviewed.  Constitutional:      General: She is not in acute distress.    Appearance: She is well-developed. She is not ill-appearing or diaphoretic.  HENT:     Head: Normocephalic and atraumatic.     Right Ear: External ear normal.     Left Ear: External ear normal.     Nose: Nose normal.  Eyes:     General:        Right eye: No  discharge.        Left eye: No discharge.  Cardiovascular:     Rate and Rhythm: Normal rate and regular rhythm.     Heart sounds: Normal heart sounds.  Pulmonary:     Effort: Pulmonary effort is normal.     Breath sounds: Examination of the right-lower field reveals wheezing. Examination of the left-lower  field reveals wheezing. Wheezing present.  Abdominal:     Palpations: Abdomen is soft.     Tenderness: There is no abdominal tenderness.  Musculoskeletal:     Right lower leg: Edema present.     Left lower leg: Edema present.     Comments: Diffuse pitting edema of BLE up to thighs  Skin:    General: Skin is warm and dry.  Neurological:     Mental Status: She is alert.  Psychiatric:        Mood and Affect: Mood is not anxious.     ED Results / Procedures / Treatments   Labs (all labs ordered are listed, but only abnormal results are displayed) Labs Reviewed  COMPREHENSIVE METABOLIC PANEL - Abnormal; Notable for the following components:      Result Value   Chloride 113 (*)    CO2 19 (*)    BUN 28 (*)    Creatinine, Ser 2.22 (*)    Calcium 8.5 (*)    Total Protein 5.1 (*)    Albumin 3.0 (*)    AST 14 (*)    Alkaline Phosphatase 1,282 (*)    Total Bilirubin 0.2 (*)    GFR calc non Af Amer 24 (*)    GFR calc Af Amer 28 (*)    All other components within normal limits  CBC WITH DIFFERENTIAL/PLATELET - Abnormal; Notable for the following components:   RBC 2.75 (*)    Hemoglobin 8.4 (*)    HCT 28.5 (*)    MCV 103.6 (*)    MCHC 29.5 (*)    RDW 15.9 (*)    Platelets 145 (*)    Lymphs Abs 0.5 (*)    All other components within normal limits  URINALYSIS, ROUTINE W REFLEX MICROSCOPIC - Abnormal; Notable for the following components:   APPearance HAZY (*)    Hgb urine dipstick SMALL (*)    Protein, ur >=300 (*)    Bacteria, UA RARE (*)    All other components within normal limits    EKG None  Radiology DG Chest 2 View  Result Date: 02/15/2020 CLINICAL DATA:   Shortness of breath and dry cough for the past 2 weeks. EXAM: CHEST - 2 VIEW COMPARISON:  CT chest dated October 21, 2019. Chest x-ray dated June 01, 2018. FINDINGS: There are remains at the upper limits of normal in size. New mild pulmonary vascular congestion and trace bilateral pleural effusions. No consolidation or pneumothorax. No acute osseous abnormality. IMPRESSION: 1. New mild pulmonary vascular congestion and trace bilateral pleural effusions. Electronically Signed   By: Titus Dubin M.D.   On: 02/15/2020 10:27    Procedures Procedures (including critical care time)  Medications Ordered in ED Medications  furosemide (LASIX) injection 40 mg (has no administration in time range)    ED Course  I have reviewed the triage vital signs and the nursing notes.  Pertinent labs & imaging results that were available during my care of the patient were reviewed by me and considered in my medical decision making (see chart for details).    MDM Rules/Calculators/A&P                          Patient has significant peripheral edema, but is in no acute distress. Likely related to CKD.  Her creatinine is a little higher than most recent baseline in our system.  I discussed with Dr. Augustin Coupe, who agrees with 40 mg lasix and follow  up in about 1 week with Dr. Marval Regal.  She does have some symptoms of mild pulmonary edema but has no increased work of breathing now and no hypoxia.  It is noted that her alk phos is more elevated than typical though it has been elevated in the past.  Her other LFTs are benign including normal bilirubin.  No abdominal pain.  I do not think she has an emergent obstruction or biliary emergency.  We discussed return precautions but at this point she appears stable for discharge home. Final Clinical Impression(s) / ED Diagnoses Final diagnoses:  Chronic kidney disease, unspecified CKD stage  Lymphedema    Rx / DC Orders ED Discharge Orders         Ordered    furosemide  (LASIX) 40 MG tablet  Daily     Discontinue  Reprint     02/16/20 0756           Sherwood Gambler, MD 02/16/20 3403    Sherwood Gambler, MD 02/16/20 [phone]

## 2020-02-24 DIAGNOSIS — I890 Lymphedema, not elsewhere classified: Secondary | ICD-10-CM | POA: Diagnosis not present

## 2020-03-10 DIAGNOSIS — E785 Hyperlipidemia, unspecified: Secondary | ICD-10-CM | POA: Diagnosis not present

## 2020-03-10 DIAGNOSIS — N1832 Chronic kidney disease, stage 3b: Secondary | ICD-10-CM | POA: Diagnosis not present

## 2020-03-10 DIAGNOSIS — E118 Type 2 diabetes mellitus with unspecified complications: Secondary | ICD-10-CM | POA: Diagnosis not present

## 2020-03-10 DIAGNOSIS — R809 Proteinuria, unspecified: Secondary | ICD-10-CM | POA: Diagnosis not present

## 2020-03-10 DIAGNOSIS — I129 Hypertensive chronic kidney disease with stage 1 through stage 4 chronic kidney disease, or unspecified chronic kidney disease: Secondary | ICD-10-CM | POA: Diagnosis not present

## 2020-03-10 DIAGNOSIS — D631 Anemia in chronic kidney disease: Secondary | ICD-10-CM | POA: Diagnosis not present

## 2020-03-10 DIAGNOSIS — Z940 Kidney transplant status: Secondary | ICD-10-CM | POA: Diagnosis not present

## 2020-03-10 DIAGNOSIS — T829XXA Unspecified complication of cardiac and vascular prosthetic device, implant and graft, initial encounter: Secondary | ICD-10-CM | POA: Diagnosis not present

## 2020-03-10 DIAGNOSIS — Z79899 Other long term (current) drug therapy: Secondary | ICD-10-CM | POA: Diagnosis not present

## 2020-03-10 DIAGNOSIS — I959 Hypotension, unspecified: Secondary | ICD-10-CM | POA: Diagnosis not present

## 2020-03-10 DIAGNOSIS — N2581 Secondary hyperparathyroidism of renal origin: Secondary | ICD-10-CM | POA: Diagnosis not present

## 2020-03-10 DIAGNOSIS — N189 Chronic kidney disease, unspecified: Secondary | ICD-10-CM | POA: Diagnosis not present

## 2020-03-14 ENCOUNTER — Inpatient Hospital Stay (HOSPITAL_COMMUNITY)
Admission: EM | Admit: 2020-03-14 | Discharge: 2020-03-24 | DRG: 291 | Disposition: A | Discharge status: Home Health | Payer: Medicare Other | Attending: Internal Medicine | Admitting: Internal Medicine

## 2020-03-14 ENCOUNTER — Other Ambulatory Visit: Payer: Self-pay

## 2020-03-14 ENCOUNTER — Emergency Department (HOSPITAL_COMMUNITY): Payer: Medicare Other

## 2020-03-14 DIAGNOSIS — M79651 Pain in right thigh: Secondary | ICD-10-CM | POA: Diagnosis not present

## 2020-03-14 DIAGNOSIS — Z888 Allergy status to other drugs, medicaments and biological substances status: Secondary | ICD-10-CM

## 2020-03-14 DIAGNOSIS — E785 Hyperlipidemia, unspecified: Secondary | ICD-10-CM | POA: Diagnosis present

## 2020-03-14 DIAGNOSIS — I480 Paroxysmal atrial fibrillation: Secondary | ICD-10-CM | POA: Diagnosis not present

## 2020-03-14 DIAGNOSIS — Z23 Encounter for immunization: Secondary | ICD-10-CM | POA: Diagnosis not present

## 2020-03-14 DIAGNOSIS — E208 Other hypoparathyroidism: Secondary | ICD-10-CM | POA: Diagnosis present

## 2020-03-14 DIAGNOSIS — N183 Chronic kidney disease, stage 3 unspecified: Secondary | ICD-10-CM | POA: Diagnosis not present

## 2020-03-14 DIAGNOSIS — Z79899 Other long term (current) drug therapy: Secondary | ICD-10-CM

## 2020-03-14 DIAGNOSIS — M25451 Effusion, right hip: Secondary | ICD-10-CM | POA: Diagnosis not present

## 2020-03-14 DIAGNOSIS — D631 Anemia in chronic kidney disease: Secondary | ICD-10-CM | POA: Diagnosis present

## 2020-03-14 DIAGNOSIS — R0602 Shortness of breath: Secondary | ICD-10-CM

## 2020-03-14 DIAGNOSIS — I82621 Acute embolism and thrombosis of deep veins of right upper extremity: Secondary | ICD-10-CM | POA: Diagnosis present

## 2020-03-14 DIAGNOSIS — Z8249 Family history of ischemic heart disease and other diseases of the circulatory system: Secondary | ICD-10-CM

## 2020-03-14 DIAGNOSIS — D509 Iron deficiency anemia, unspecified: Secondary | ICD-10-CM | POA: Diagnosis present

## 2020-03-14 DIAGNOSIS — N049 Nephrotic syndrome with unspecified morphologic changes: Secondary | ICD-10-CM | POA: Diagnosis present

## 2020-03-14 DIAGNOSIS — R000 Tachycardia, unspecified: Secondary | ICD-10-CM | POA: Diagnosis not present

## 2020-03-14 DIAGNOSIS — I517 Cardiomegaly: Secondary | ICD-10-CM | POA: Diagnosis not present

## 2020-03-14 DIAGNOSIS — I252 Old myocardial infarction: Secondary | ICD-10-CM

## 2020-03-14 DIAGNOSIS — R0902 Hypoxemia: Secondary | ICD-10-CM

## 2020-03-14 DIAGNOSIS — I43 Cardiomyopathy in diseases classified elsewhere: Secondary | ICD-10-CM | POA: Diagnosis present

## 2020-03-14 DIAGNOSIS — I70201 Unspecified atherosclerosis of native arteries of extremities, right leg: Secondary | ICD-10-CM | POA: Diagnosis not present

## 2020-03-14 DIAGNOSIS — E538 Deficiency of other specified B group vitamins: Secondary | ICD-10-CM | POA: Diagnosis present

## 2020-03-14 DIAGNOSIS — M79659 Pain in unspecified thigh: Secondary | ICD-10-CM

## 2020-03-14 DIAGNOSIS — I255 Ischemic cardiomyopathy: Secondary | ICD-10-CM | POA: Diagnosis present

## 2020-03-14 DIAGNOSIS — I130 Hypertensive heart and chronic kidney disease with heart failure and stage 1 through stage 4 chronic kidney disease, or unspecified chronic kidney disease: Secondary | ICD-10-CM | POA: Diagnosis present

## 2020-03-14 DIAGNOSIS — I2510 Atherosclerotic heart disease of native coronary artery without angina pectoris: Secondary | ICD-10-CM | POA: Diagnosis present

## 2020-03-14 DIAGNOSIS — N179 Acute kidney failure, unspecified: Secondary | ICD-10-CM | POA: Diagnosis present

## 2020-03-14 DIAGNOSIS — Z833 Family history of diabetes mellitus: Secondary | ICD-10-CM

## 2020-03-14 DIAGNOSIS — I5021 Acute systolic (congestive) heart failure: Secondary | ICD-10-CM | POA: Diagnosis present

## 2020-03-14 DIAGNOSIS — Z20822 Contact with and (suspected) exposure to covid-19: Secondary | ICD-10-CM | POA: Diagnosis present

## 2020-03-14 DIAGNOSIS — S8010XA Contusion of unspecified lower leg, initial encounter: Secondary | ICD-10-CM | POA: Diagnosis present

## 2020-03-14 DIAGNOSIS — I447 Left bundle-branch block, unspecified: Secondary | ICD-10-CM | POA: Diagnosis not present

## 2020-03-14 DIAGNOSIS — I5020 Unspecified systolic (congestive) heart failure: Secondary | ICD-10-CM | POA: Diagnosis not present

## 2020-03-14 DIAGNOSIS — Z8261 Family history of arthritis: Secondary | ICD-10-CM

## 2020-03-14 DIAGNOSIS — M79604 Pain in right leg: Secondary | ICD-10-CM | POA: Diagnosis not present

## 2020-03-14 DIAGNOSIS — T79A0XA Compartment syndrome, unspecified, initial encounter: Secondary | ICD-10-CM | POA: Diagnosis present

## 2020-03-14 DIAGNOSIS — F17210 Nicotine dependence, cigarettes, uncomplicated: Secondary | ICD-10-CM | POA: Diagnosis present

## 2020-03-14 DIAGNOSIS — M7071 Other bursitis of hip, right hip: Secondary | ICD-10-CM | POA: Diagnosis not present

## 2020-03-14 DIAGNOSIS — N2581 Secondary hyperparathyroidism of renal origin: Secondary | ICD-10-CM | POA: Diagnosis present

## 2020-03-14 DIAGNOSIS — R609 Edema, unspecified: Secondary | ICD-10-CM

## 2020-03-14 DIAGNOSIS — D849 Immunodeficiency, unspecified: Secondary | ICD-10-CM | POA: Diagnosis present

## 2020-03-14 DIAGNOSIS — R0689 Other abnormalities of breathing: Secondary | ICD-10-CM | POA: Diagnosis not present

## 2020-03-14 DIAGNOSIS — D638 Anemia in other chronic diseases classified elsewhere: Secondary | ICD-10-CM | POA: Diagnosis present

## 2020-03-14 DIAGNOSIS — Y929 Unspecified place or not applicable: Secondary | ICD-10-CM

## 2020-03-14 DIAGNOSIS — E119 Type 2 diabetes mellitus without complications: Secondary | ICD-10-CM

## 2020-03-14 DIAGNOSIS — Z940 Kidney transplant status: Secondary | ICD-10-CM | POA: Diagnosis not present

## 2020-03-14 DIAGNOSIS — J9690 Respiratory failure, unspecified, unspecified whether with hypoxia or hypercapnia: Secondary | ICD-10-CM | POA: Diagnosis not present

## 2020-03-14 DIAGNOSIS — E8770 Fluid overload, unspecified: Secondary | ICD-10-CM | POA: Diagnosis not present

## 2020-03-14 DIAGNOSIS — N1832 Chronic kidney disease, stage 3b: Secondary | ICD-10-CM | POA: Diagnosis present

## 2020-03-14 DIAGNOSIS — E1122 Type 2 diabetes mellitus with diabetic chronic kidney disease: Secondary | ICD-10-CM | POA: Diagnosis present

## 2020-03-14 DIAGNOSIS — I340 Nonrheumatic mitral (valve) insufficiency: Secondary | ICD-10-CM | POA: Diagnosis not present

## 2020-03-14 DIAGNOSIS — J9601 Acute respiratory failure with hypoxia: Secondary | ICD-10-CM | POA: Diagnosis present

## 2020-03-14 DIAGNOSIS — I361 Nonrheumatic tricuspid (valve) insufficiency: Secondary | ICD-10-CM | POA: Diagnosis not present

## 2020-03-14 DIAGNOSIS — I509 Heart failure, unspecified: Secondary | ICD-10-CM | POA: Diagnosis not present

## 2020-03-14 DIAGNOSIS — Z6835 Body mass index (BMI) 35.0-35.9, adult: Secondary | ICD-10-CM

## 2020-03-14 DIAGNOSIS — N250 Renal osteodystrophy: Secondary | ICD-10-CM | POA: Diagnosis present

## 2020-03-14 DIAGNOSIS — M25452 Effusion, left hip: Secondary | ICD-10-CM | POA: Diagnosis not present

## 2020-03-14 DIAGNOSIS — K219 Gastro-esophageal reflux disease without esophagitis: Secondary | ICD-10-CM | POA: Diagnosis present

## 2020-03-14 DIAGNOSIS — R05 Cough: Secondary | ICD-10-CM | POA: Diagnosis not present

## 2020-03-14 DIAGNOSIS — I5043 Acute on chronic combined systolic (congestive) and diastolic (congestive) heart failure: Secondary | ICD-10-CM | POA: Diagnosis not present

## 2020-03-14 DIAGNOSIS — T502X5A Adverse effect of carbonic-anhydrase inhibitors, benzothiadiazides and other diuretics, initial encounter: Secondary | ICD-10-CM | POA: Diagnosis present

## 2020-03-14 DIAGNOSIS — R52 Pain, unspecified: Secondary | ICD-10-CM | POA: Diagnosis not present

## 2020-03-14 DIAGNOSIS — M25461 Effusion, right knee: Secondary | ICD-10-CM | POA: Diagnosis not present

## 2020-03-14 DIAGNOSIS — E1129 Type 2 diabetes mellitus with other diabetic kidney complication: Secondary | ICD-10-CM | POA: Diagnosis not present

## 2020-03-14 DIAGNOSIS — J811 Chronic pulmonary edema: Secondary | ICD-10-CM | POA: Diagnosis not present

## 2020-03-14 DIAGNOSIS — I10 Essential (primary) hypertension: Secondary | ICD-10-CM | POA: Diagnosis present

## 2020-03-14 DIAGNOSIS — R600 Localized edema: Secondary | ICD-10-CM | POA: Diagnosis not present

## 2020-03-14 DIAGNOSIS — M6289 Other specified disorders of muscle: Secondary | ICD-10-CM | POA: Diagnosis not present

## 2020-03-14 DIAGNOSIS — X58XXXA Exposure to other specified factors, initial encounter: Secondary | ICD-10-CM | POA: Diagnosis present

## 2020-03-14 DIAGNOSIS — Z794 Long term (current) use of insulin: Secondary | ICD-10-CM

## 2020-03-14 DIAGNOSIS — I4819 Other persistent atrial fibrillation: Secondary | ICD-10-CM | POA: Diagnosis present

## 2020-03-14 DIAGNOSIS — D649 Anemia, unspecified: Secondary | ICD-10-CM | POA: Diagnosis not present

## 2020-03-14 DIAGNOSIS — R748 Abnormal levels of other serum enzymes: Secondary | ICD-10-CM | POA: Diagnosis present

## 2020-03-14 DIAGNOSIS — M609 Myositis, unspecified: Secondary | ICD-10-CM | POA: Diagnosis not present

## 2020-03-14 LAB — CBC WITH DIFFERENTIAL/PLATELET
Abs Immature Granulocytes: 0.04 10*3/uL (ref 0.00–0.07)
Basophils Absolute: 0 10*3/uL (ref 0.0–0.1)
Basophils Relative: 0 %
Eosinophils Absolute: 0 10*3/uL (ref 0.0–0.5)
Eosinophils Relative: 0 %
HCT: 30.2 % — ABNORMAL LOW (ref 36.0–46.0)
Hemoglobin: 8.8 g/dL — ABNORMAL LOW (ref 12.0–15.0)
Immature Granulocytes: 1 %
Lymphocytes Relative: 4 %
Lymphs Abs: 0.3 10*3/uL — ABNORMAL LOW (ref 0.7–4.0)
MCH: 29.5 pg (ref 26.0–34.0)
MCHC: 29.1 g/dL — ABNORMAL LOW (ref 30.0–36.0)
MCV: 101.3 fL — ABNORMAL HIGH (ref 80.0–100.0)
Monocytes Absolute: 0.6 10*3/uL (ref 0.1–1.0)
Monocytes Relative: 7 %
Neutro Abs: 7.8 10*3/uL — ABNORMAL HIGH (ref 1.7–7.7)
Neutrophils Relative %: 88 %
Platelets: 178 10*3/uL (ref 150–400)
RBC: 2.98 MIL/uL — ABNORMAL LOW (ref 3.87–5.11)
RDW: 15.7 % — ABNORMAL HIGH (ref 11.5–15.5)
WBC: 8.8 10*3/uL (ref 4.0–10.5)
nRBC: 0 % (ref 0.0–0.2)

## 2020-03-14 LAB — COMPREHENSIVE METABOLIC PANEL
ALT: 15 U/L (ref 0–44)
AST: 21 U/L (ref 15–41)
Albumin: 3.2 g/dL — ABNORMAL LOW (ref 3.5–5.0)
Alkaline Phosphatase: 1308 U/L — ABNORMAL HIGH (ref 38–126)
Anion gap: 11 (ref 5–15)
BUN: 26 mg/dL — ABNORMAL HIGH (ref 6–20)
CO2: 17 mmol/L — ABNORMAL LOW (ref 22–32)
Calcium: 8.1 mg/dL — ABNORMAL LOW (ref 8.9–10.3)
Chloride: 113 mmol/L — ABNORMAL HIGH (ref 98–111)
Creatinine, Ser: 2.23 mg/dL — ABNORMAL HIGH (ref 0.44–1.00)
GFR calc Af Amer: 28 mL/min — ABNORMAL LOW (ref >60)
GFR calc non Af Amer: 24 mL/min — ABNORMAL LOW (ref >60)
Glucose, Bld: 145 mg/dL — ABNORMAL HIGH (ref 70–99)
Potassium: 4.1 mmol/L (ref 3.5–5.1)
Sodium: 141 mmol/L (ref 135–145)
Total Bilirubin: 0.8 mg/dL (ref 0.3–1.2)
Total Protein: 5.5 g/dL — ABNORMAL LOW (ref 6.5–8.1)

## 2020-03-14 LAB — LIPID PANEL
Cholesterol: 111 mg/dL (ref 0–200)
HDL: 43 mg/dL (ref >40)
LDL Cholesterol: 55 mg/dL (ref 0–99)
Total CHOL/HDL Ratio: 2.6 RATIO
Triglycerides: 66 mg/dL (ref <150)
VLDL: 13 mg/dL (ref 0–40)

## 2020-03-14 LAB — URINALYSIS, ROUTINE W REFLEX MICROSCOPIC
Bacteria, UA: NONE SEEN
Bilirubin Urine: NEGATIVE
Glucose, UA: 50 mg/dL — AB
Ketones, ur: NEGATIVE mg/dL
Leukocytes,Ua: NEGATIVE
Nitrite: NEGATIVE
Protein, ur: >=300 mg/dL — AB
Specific Gravity, Urine: 1.018 (ref 1.005–1.030)
pH: 5 (ref 5.0–8.0)

## 2020-03-14 LAB — HIV ANTIBODY (ROUTINE TESTING W REFLEX): HIV Screen 4th Generation wRfx: NONREACTIVE

## 2020-03-14 LAB — LACTIC ACID, PLASMA
Lactic Acid, Venous: 1.9 mmol/L (ref 0.5–1.9)
Lactic Acid, Venous: 1.6 mmol/L (ref 0.5–1.9)

## 2020-03-14 LAB — SARS CORONAVIRUS 2 BY RT PCR (HOSPITAL ORDER, PERFORMED IN ~~LOC~~ HOSPITAL LAB): SARS Coronavirus 2: NEGATIVE

## 2020-03-14 LAB — PROTEIN / CREATININE RATIO, URINE
Creatinine, Urine: 68.5 mg/dL
Total Protein, Urine: 424 mg/dL

## 2020-03-14 LAB — HEPARIN LEVEL (UNFRACTIONATED): Heparin Unfractionated: <0.1 IU/mL — ABNORMAL LOW (ref 0.30–0.70)

## 2020-03-14 LAB — HEMOGLOBIN A1C
Hgb A1c MFr Bld: 5 % (ref 4.8–5.6)
Mean Plasma Glucose: 96.8 mg/dL

## 2020-03-14 LAB — SODIUM, URINE, RANDOM: Sodium, Ur: 95 mmol/L

## 2020-03-14 LAB — CBG MONITORING, ED: Glucose-Capillary: 106 mg/dL — ABNORMAL HIGH (ref 70–99)

## 2020-03-14 LAB — BRAIN NATRIURETIC PEPTIDE: B Natriuretic Peptide: >4500 pg/mL — ABNORMAL HIGH (ref 0.0–100.0)

## 2020-03-14 LAB — PROCALCITONIN: Procalcitonin: 2.65 ng/mL

## 2020-03-14 LAB — MAGNESIUM: Magnesium: 1.8 mg/dL (ref 1.7–2.4)

## 2020-03-14 LAB — PHOSPHORUS: Phosphorus: 3.6 mg/dL (ref 2.5–4.6)

## 2020-03-14 MED ORDER — PANTOPRAZOLE SODIUM 40 MG PO TBEC
40.0000 mg | DELAYED_RELEASE_TABLET | Freq: Every day | ORAL | Status: DC
  Administered 2020-03-14 – 2020-03-24 (×11): 40 mg via ORAL
  Filled 2020-03-14 (×11): qty 1

## 2020-03-14 MED ORDER — INSULIN ASPART 100 UNIT/ML ~~LOC~~ SOLN
0.0000 [IU] | Freq: Three times a day (TID) | SUBCUTANEOUS | Status: DC
  Administered 2020-03-16 (×2): 4 [IU] via SUBCUTANEOUS
  Administered 2020-03-18 (×2): 3 [IU] via SUBCUTANEOUS
  Administered 2020-03-19: 4 [IU] via SUBCUTANEOUS
  Administered 2020-03-22: 3 [IU] via SUBCUTANEOUS
  Administered 2020-03-23 – 2020-03-24 (×2): 4 [IU] via SUBCUTANEOUS

## 2020-03-14 MED ORDER — FUROSEMIDE 10 MG/ML IJ SOLN
40.0000 mg | Freq: Once | INTRAMUSCULAR | Status: AC
  Administered 2020-03-14: 40 mg via INTRAVENOUS
  Filled 2020-03-14: qty 4

## 2020-03-14 MED ORDER — SODIUM CHLORIDE 0.9% FLUSH
3.0000 mL | Freq: Two times a day (BID) | INTRAVENOUS | Status: DC
  Administered 2020-03-14 – 2020-03-24 (×12): 3 mL via INTRAVENOUS

## 2020-03-14 MED ORDER — HEPARIN BOLUS VIA INFUSION
3000.0000 [IU] | Freq: Once | INTRAVENOUS | Status: AC
  Administered 2020-03-14: 3000 [IU] via INTRAVENOUS
  Filled 2020-03-14: qty 3000

## 2020-03-14 MED ORDER — MORPHINE SULFATE (PF) 2 MG/ML IV SOLN
2.0000 mg | Freq: Once | INTRAVENOUS | Status: AC
  Administered 2020-03-14: 2 mg via INTRAVENOUS
  Filled 2020-03-14: qty 1

## 2020-03-14 MED ORDER — HEPARIN (PORCINE) 25000 UT/250ML-% IV SOLN
2250.0000 [IU]/h | INTRAVENOUS | Status: DC
  Administered 2020-03-14: 1250 [IU]/h via INTRAVENOUS
  Administered 2020-03-15: 1800 [IU]/h via INTRAVENOUS
  Administered 2020-03-15: 1600 [IU]/h via INTRAVENOUS
  Administered 2020-03-16: 2000 [IU]/h via INTRAVENOUS
  Filled 2020-03-14 (×4): qty 250

## 2020-03-14 MED ORDER — HEPARIN BOLUS VIA INFUSION
4500.0000 [IU] | Freq: Once | INTRAVENOUS | Status: AC
  Administered 2020-03-14: 4500 [IU] via INTRAVENOUS
  Filled 2020-03-14: qty 4500

## 2020-03-14 MED ORDER — PREDNISONE 5 MG PO TABS
2.5000 mg | ORAL_TABLET | Freq: Every day | ORAL | Status: DC
  Administered 2020-03-15 – 2020-03-24 (×10): 2.5 mg via ORAL
  Filled 2020-03-14 (×13): qty 1

## 2020-03-14 MED ORDER — TACROLIMUS 1 MG PO CAPS
3.0000 mg | ORAL_CAPSULE | Freq: Two times a day (BID) | ORAL | Status: DC
  Administered 2020-03-14: 3 mg via ORAL
  Filled 2020-03-14 (×2): qty 3

## 2020-03-14 MED ORDER — ACETAMINOPHEN 650 MG RE SUPP: 650.0000 mg | Freq: Four times a day (QID) | RECTAL | Status: DC | PRN

## 2020-03-14 MED ORDER — IPRATROPIUM BROMIDE HFA 17 MCG/ACT IN AERS
4.0000 | INHALATION_SPRAY | Freq: Once | RESPIRATORY_TRACT | Status: AC
  Administered 2020-03-14: 4 via RESPIRATORY_TRACT
  Filled 2020-03-14: qty 12.9

## 2020-03-14 MED ORDER — SODIUM CHLORIDE 0.9 % IV SOLN
500.0000 mg | INTRAVENOUS | Status: DC
  Administered 2020-03-14 – 2020-03-16 (×3): 500 mg via INTRAVENOUS
  Filled 2020-03-14 (×4): qty 500

## 2020-03-14 MED ORDER — SODIUM CHLORIDE 0.9 % IV SOLN
1.0000 g | INTRAVENOUS | Status: DC
  Administered 2020-03-14 – 2020-03-16 (×3): 1 g via INTRAVENOUS
  Filled 2020-03-14: qty 10
  Filled 2020-03-14: qty 1
  Filled 2020-03-14: qty 10
  Filled 2020-03-14: qty 1

## 2020-03-14 MED ORDER — ATORVASTATIN CALCIUM 10 MG PO TABS
10.0000 mg | ORAL_TABLET | ORAL | Status: DC
  Administered 2020-03-15 – 2020-03-17 (×2): 10 mg via ORAL
  Filled 2020-03-14: qty 1

## 2020-03-14 MED ORDER — ACETAMINOPHEN 325 MG PO TABS
650.0000 mg | ORAL_TABLET | Freq: Four times a day (QID) | ORAL | Status: DC | PRN
  Administered 2020-03-14 – 2020-03-23 (×9): 650 mg via ORAL
  Filled 2020-03-14 (×9): qty 2

## 2020-03-14 MED ORDER — TACROLIMUS 1 MG PO CAPS
4.0000 mg | ORAL_CAPSULE | Freq: Two times a day (BID) | ORAL | Status: DC
  Administered 2020-03-14 – 2020-03-15 (×2): 4 mg via ORAL
  Filled 2020-03-14 (×4): qty 4

## 2020-03-14 MED ORDER — ALBUTEROL SULFATE HFA 108 (90 BASE) MCG/ACT IN AERS
4.0000 | INHALATION_SPRAY | Freq: Once | RESPIRATORY_TRACT | Status: AC
  Administered 2020-03-14: 4 via RESPIRATORY_TRACT
  Filled 2020-03-14: qty 6.7

## 2020-03-14 MED ORDER — MYCOPHENOLATE SODIUM 180 MG PO TBEC
540.0000 mg | DELAYED_RELEASE_TABLET | Freq: Two times a day (BID) | ORAL | Status: DC
  Administered 2020-03-14 – 2020-03-24 (×21): 540 mg via ORAL
  Filled 2020-03-14 (×22): qty 3

## 2020-03-14 MED ORDER — FUROSEMIDE 10 MG/ML IJ SOLN
40.0000 mg | Freq: Two times a day (BID) | INTRAMUSCULAR | Status: DC
  Administered 2020-03-14: 40 mg via INTRAVENOUS
  Filled 2020-03-14: qty 4

## 2020-03-14 NOTE — Progress Notes (Signed)
ANTICOAGULATION CONSULT NOTE - Initial Consult  Pharmacy Consult for heparin Indication: DVT  Allergies  Allergen Reactions  . Infed [Iron Dextran] Other (See Comments)    Pt has chest pain  Chest pain  . Cefazolin Itching and Nausea And Vomiting  . Ancef [Cefazolin Sodium] Other (See Comments)    Tolerated rocephin 11/03/10 Reports ancef causes itching, sweating, vomiting (x2-2009?)  . Lisinopril Swelling  . Other     Pt may be allergic to another antibiotic daughter isn't sure and pt unable to verify.    Patient Measurements: Height: 5\' 11"  (180.3 cm) Weight: 78 kg (172 lb) IBW/kg (Calculated) : 70.8 Heparin Dosing Weight: 78 kg  Vital Signs: Temp: 98.5 F (36.9 C) (09/07 2117) Temp Source: Oral (09/07 2117) BP: 130/72 (09/07 2117) Pulse Rate: 81 (09/07 2117)  Labs: Recent Labs    03/14/20 0920 03/14/20 2215  HGB 8.8*  --   HCT 30.2*  --   PLT 178  --   HEPARINUNFRC  --  <0.10*  CREATININE 2.23*  --     Estimated Creatinine Clearance: 31.5 mL/min (A) (by C-G formula based on SCr of 2.23 mg/dL (H)).   Medical History: Past Medical History:  Diagnosis Date  . Blood transfusion without reported diagnosis   . 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 GERD (gastroesophageal reflux disease)    medication induced  . Hypertension   . Renal failure    hx of  . 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  Not on anticoagulation PTA, H/H low (chronic) plts wnl.    First HL undetectable. Confirmed with patient was drawn from L hand and heparin is running at L wrist. Per RN,  no issues with infusion in the last few hours since patient was transferred to floor. Will re-bolus and increase rate.   Goal of Therapy:  Heparin level 0.3-0.7 units/ml Monitor platelets by anticoagulation protocol: Yes   Plan:  Heparin 3000 units bolus x 1 and increase infusion to 1400 units/hr Monitor daily HL, CBC/plt Monitor for signs/symptoms of bleeding  F/u oral anticoagulation plan   Benetta Spar, PharmD, BCPS, BCCP Clinical Pharmacist  Please check AMION for all [facility] phone numbers After 10:00 PM, call [facility]

## 2020-03-14 NOTE — Progress Notes (Signed)
ANTICOAGULATION CONSULT NOTE - Initial Consult  Pharmacy Consult for heparin Indication: DVT  Allergies  Allergen Reactions  . Infed [Iron Dextran] Other (See Comments)    Pt has chest pain  Chest pain  . Cefazolin Itching and Nausea And Vomiting  . Ancef [Cefazolin Sodium] Other (See Comments)    Tolerated rocephin 11/03/10 Reports ancef causes itching, sweating, vomiting (x2-2009?)  . Lisinopril Swelling  . Other     Pt may be allergic to another antibiotic daughter isn't sure and pt unable to verify.    Patient Measurements: Height: 5\' 11"  (180.3 cm) Weight: 78 kg (172 lb) IBW/kg (Calculated) : 70.8 Heparin Dosing Weight: TBW  Vital Signs: Temp: 100.2 F (37.9 C) (09/07 0905) Temp Source: Oral (09/07 0905) BP: 139/85 (09/07 1215) Pulse Rate: 81 (09/07 1200)  Labs: Recent Labs    03/14/20 0920  HGB 8.8*  HCT 30.2*  PLT 178  CREATININE 2.23*    Estimated Creatinine Clearance: 31.5 mL/min (A) (by C-G formula based on SCr of 2.23 mg/dL (H)).   Medical History: Past Medical History:  Diagnosis Date  . Blood transfusion without reported diagnosis   . 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 GERD (gastroesophageal reflux disease)    medication induced  . Hypertension   . Renal failure    hx of  . 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  Not on anticoagulation PTA, H/H low (chronic) plts wnl.    Goal of Therapy:  Heparin level 0.3-0.7 units/ml Monitor platelets by anticoagulation protocol: Yes   Plan:  Heparin 4500 units IV x 1, and gtt at 1250 units/hr F/u 6 hour  heparin level  Bertis Ruddy, PharmD Clinical Pharmacist ED Pharmacist Phone # [phone] 03/14/2020 12:42 PM

## 2020-03-14 NOTE — ED Triage Notes (Signed)
Pt bib EMS from home with c/o shob since last night with cough x 2 days. Bilateral lower edema, swelling to right arm x 1 month. [facility] reports pt was 76% on RA. Increased to 95% on 10L NRB. Pt A&O x4, labored breathing, cough present. States she cannot lay flat at night due to shortness of breath. Currently 100% on 15L NRB.  EMS v/s: 102.6 temporal 180/100 110 HR 190 CBG

## 2020-03-14 NOTE — ED Provider Notes (Signed)
[facility] EMERGENCY DEPARTMENT Provider Note   CSN: [id number] Arrival date & time: 03/14/20  0857     History Chief Complaint  Patient presents with  . Shortness of Breath    [name] is a 54 y.o. female.  HPI   Patient is a 54 year old female with a history of diabetes mellitus, GERD, hypertension, renal transplant, CKD, who presents due to cough and shortness of breath.  Patient states she has not been vaccinated for COVID-19.  About 2 days ago she began experiencing a dry cough.  Yesterday she also began experiencing worsening shortness of breath.  EMS was called and patient was found to be saturating at 76% on room air.  She was placed on 10 L via nonrebreather and her saturations increased to 95%.  Her saturations were 100% upon arrival.  Patient is tachypneic with labored breathing.  She reports continued shortness of breath.  Patient additionally reports worsening peripheral edema in all 4 extremities.  She was seen with a similar complaint about 1 month ago and was started on 40 mg of Lasix.  She followed up with nephrology who increased her dose to 80 mg of Lasix. She denies any relief of her sx. She additionally notes mild diffuse CP when coughing as well as fevers and intermittent nausea, though none currently. Pt was previously a dialysis pt but this was discontinued 2/2 renal transplant about 10 years ago. No abdominal pain, vomiting, diarrhea, syncope.      Past Medical History:  Diagnosis Date  . Blood transfusion without reported diagnosis   . 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 GERD (gastroesophageal reflux disease)    medication induced  . Hypertension   . Renal failure    hx of  . 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 Multiple pulmonary nodules 11/14/2019  . Vitamin B 12 deficiency 10/11/2019  . Myalgia 10/11/2019  . Leg weakness, bilateral 08/19/2019  . GERD (gastroesophageal reflux disease) 01/27/2019  . Secondary hypoparathyroidism ([place]) 06/11/2018  . Hypomagnesemia 06/11/2018  . Hypotension 06/11/2018  . Abnormal laboratory test   . Hypokalemia   . Pleuritic chest pain 06/01/2018  . Diarrhea due to drug 02/04/2018  . Hyperlipidemia 02/12/2017  . Preventative health care 08/12/2012  . Tobacco abuse 04/30/2012  . S/p cadaver renal transplant 11/19/2011  . Type II diabetes mellitus, well controlled ([place]) 11/04/2011  . HTN (hypertension) 11/03/2011    Past Surgical History:  Procedure Laterality Date  . [place]  . CHOLECYSTECTOMY  2008  . COLONOSCOPY  10 years ago   Dr.Hung="normal"  . DG AV DIALYSIS  SHUNT ACCESS EXIST*L* OR    . KIDNEY TRANSPLANT  2011  . UMBILICAL HERNIA REPAIR  2009     OB History   No obstetric history on file.     Family History  Problem Relation Age of Onset  . Hypertension Mother   . Diabetes Mother   . Atrial fibrillation Father   . Hypertension Father   . Arthritis Father   . Heart failure Sister   . Hypertension Sister  all 4 sisters  . Hypertension Brother        both brothers  . Diabetes Brother   . Colon cancer Neg Hx   . Stomach cancer Neg Hx     Social History   Tobacco Use  . Smoking status: Current Some Day Smoker    Packs/day: 0.50    Types: Cigarettes    Last attempt to quit: 11/05/2017    Years since quitting: 2.3  . Smokeless tobacco: Never Used  . Tobacco comment: 10 some days   Vaping Use  . Vaping Use: Never used  Substance Use Topics  . Alcohol use: Never    Alcohol/week: 0.0 standard drinks  . Drug use: Never    Home Medications Prior to Admission medications     Medication Sig Start Date End Date Taking? Authorizing Provider  amLODipine (NORVASC) 10 MG tablet Take 10 mg by mouth daily.    [provider]  atorvastatin (LIPITOR) 10 MG tablet Take 10 mg by mouth 3 (three) times a week. Taking on Monday, Wednesday, Friday.    [provider]  calcitRIOL (ROCALTROL) 0.25 MCG capsule Take 0.7 mcg by mouth 2 (two) times a day. 04/14/18   [provider]  Calcium-Phosphorus-Vitamin D (CITRACAL +D3 PO) Take 1 tablet by mouth. Mon, Wed, Friday nights only    [provider]  DULoxetine (CYMBALTA) 30 MG capsule TAKE 1 CAPSULE(30 MG) BY MOUTH DAILY 12/16/19   [name], [name], DO  furosemide (LASIX) 40 MG tablet Take 1 tablet (40 mg total) by mouth daily. 02/16/20   Sherwood Gambler, MD  insulin aspart (NOVOLOG) 100 UNIT/ML injection Inject 8-14 Units into the skin as needed for high blood sugar.     [provider]  labetalol (NORMODYNE) 200 MG tablet Take 1 tablet (200 mg total) by mouth 2 (two) times daily. 06/04/18   [name], [name], DO  magnesium oxide (MAG-OX) 400 MG tablet Take 400 mg by mouth 2 (two) times daily.    [provider]  mycophenolate (MYFORTIC) 180 MG EC tablet Take 3 tablets (540 mg total) by mouth 2 (two) times daily. 06/04/18   [name], [name], DO  omeprazole (PRILOSEC) 40 MG capsule Take 40 mg by mouth daily. 08/18/19   [provider]  PROGRAF 1 MG capsule Take 3 mg by mouth 2 (two) times daily.  05/15/17   [provider]  traMADol (ULTRAM) 50 MG tablet Take 1 tablet (50 mg total) by mouth every 6 (six) hours as needed. 09/17/19   Marybelle Killings, MD  vitamin B-12 (CYANOCOBALAMIN) 1000 MCG tablet Take 1 tablet (1,000 mcg total) by mouth daily. 10/11/19   Ladona Horns, MD    Allergies    Ardyth Harps Antonieta Pert dextran], Cefazolin, Ancef [cefazolin sodium], Lisinopril, and Other  Review of Systems   Review of Systems  All other systems reviewed and are negative. Ten systems  reviewed and are negative for acute change, except as noted in the HPI.    Physical Exam Updated Vital Signs BP (!) 135/96   Pulse (!) 102   Temp 100.2 F (37.9 C) (Oral)   Resp (!) 22   Ht 5\' 11"  (1.803 m)   Wt 78 kg   LMP 11/05/2012   SpO2 100%   BMI 23.99 kg/m   Physical Exam Vitals and nursing note reviewed.  Constitutional:      General: She is in acute distress.     Appearance: Normal appearance. She is well-developed. She is obese. She  is ill-appearing. She is not toxic-appearing or diaphoretic.  HENT:     Head: Normocephalic and atraumatic.     Right Ear: External ear normal.     Left Ear: External ear normal.     Nose: Nose normal.     Mouth/Throat:     Mouth: Mucous membranes are moist.     Pharynx: Oropharynx is clear. No oropharyngeal exudate or posterior oropharyngeal erythema.  Eyes:     Extraocular Movements: Extraocular movements intact.  Cardiovascular:     Rate and Rhythm: Regular rhythm. Tachycardia present.     Pulses: Normal pulses.     Heart sounds: Normal heart sounds. No murmur heard.  No friction rub. No gallop.   Pulmonary:     Effort: Tachypnea and respiratory distress present. No bradypnea or accessory muscle usage.     Breath sounds: Normal breath sounds. No stridor. No decreased breath sounds, wheezing, rhonchi or rales.  Abdominal:     General: Abdomen is flat.     Tenderness: There is no abdominal tenderness.  Musculoskeletal:        General: Normal range of motion.     Cervical back: Normal range of motion and neck supple. No tenderness.     Right lower leg: Tenderness present. Edema present.     Left lower leg: Tenderness present. Edema present.     Comments: 2-3+ pitting edema noted in the bilateral lower extremities.  Diffuse tenderness secondary to significant edema.  1-2+ edema noted in the bilateral upper extremities, right greater than left.  Palpable radial and pedal pulses noted bilaterally.  Distal sensation intact.  Skin:     General: Skin is warm and dry.  Neurological:     General: No focal deficit present.     Mental Status: She is alert and oriented to person, place, and time.  Psychiatric:        Mood and Affect: Mood normal.        Behavior: Behavior normal.    ED Results / Procedures / Treatments   Labs (all labs ordered are listed, but only abnormal results are displayed) Labs Reviewed  COMPREHENSIVE METABOLIC PANEL - Abnormal; Notable for the following components:      Result Value   Chloride 113 (*)    CO2 17 (*)    Glucose, Bld 145 (*)    BUN 26 (*)    Creatinine, Ser 2.23 (*)    Calcium 8.1 (*)    Total Protein 5.5 (*)    Albumin 3.2 (*)    Alkaline Phosphatase 1,308 (*)    GFR calc non Af Amer 24 (*)    GFR calc Af Amer 28 (*)    All other components within normal limits  CBC WITH DIFFERENTIAL/PLATELET - Abnormal; Notable for the following components:   RBC 2.98 (*)    Hemoglobin 8.8 (*)    HCT 30.2 (*)    MCV 101.3 (*)    MCHC 29.1 (*)    RDW 15.7 (*)    Neutro Abs 7.8 (*)    Lymphs Abs 0.3 (*)    All other components within normal limits  BRAIN NATRIURETIC PEPTIDE - Abnormal; Notable for the following components:   B Natriuretic Peptide >4,500.0 (*)    All other components within normal limits  URINALYSIS, ROUTINE W REFLEX MICROSCOPIC - Abnormal; Notable for the following components:   APPearance HAZY (*)    Glucose, UA 50 (*)    Hgb urine dipstick SMALL (*)    Protein,  ur >=300 (*)    All other components within normal limits  SARS CORONAVIRUS 2 BY RT PCR (HOSPITAL ORDER, [facility])  CULTURE, BLOOD (ROUTINE X 2)  CULTURE, BLOOD (ROUTINE X 2)  LACTIC ACID, PLASMA  LACTIC ACID, PLASMA  BLOOD GAS, VENOUS  HEPARIN LEVEL (UNFRACTIONATED)   EKG EKG Interpretation  Date/Time:  Tuesday March 14 2020 09:10:10 EDT Ventricular Rate:  104 PR Interval:    QRS Duration: 148 QT Interval:  373 QTC Calculation: 491 R Axis:   -67 Text  Interpretation: Sinus tachycardia Ventricular bigeminy Left bundle branch block Since prior ECG , rate has increased Confirmed by Gareth Morgan [phone]) on 03/14/2020 9:25:21 AM  Radiology DG Chest Portable 1 View  Result Date: 03/14/2020 CLINICAL DATA:  Shortness of breath EXAM: PORTABLE CHEST 1 VIEW COMPARISON:  02/15/2020 FINDINGS: Mild cardiomegaly. Pulmonary vascular congestion with increased bibasilar interstitial opacities. No large pleural fluid collection. No pneumothorax. IMPRESSION: Findings suggestive of CHF with interstitial edema. Superimposed infection would be difficult to exclude. Electronically Signed   By: Davina Poke D.O.   On: 03/14/2020 10:10   UE Venous Duplex ([facility] and [facility] ONLY)  Result Date: 03/14/2020 UPPER VENOUS STUDY  Indications: Edema, and SOB Comparison Study: No prior studies. Performing Technologist: Darlin Coco  Examination Guidelines: A complete evaluation includes B-mode imaging, spectral Doppler, color Doppler, and power Doppler as needed of all accessible portions of each vessel. Bilateral testing is considered an integral part of a complete examination. Limited examinations for reoccurring indications may be performed as noted.  Right Findings: +----------+------------+---------+-----------+----------+---------------------+ RIGHT     CompressiblePhasicitySpontaneousProperties       Summary        +----------+------------+---------+-----------+----------+---------------------+ IJV           Full       Yes       Yes              Appearance of fibrous                                                           synechiae       +----------+------------+---------+-----------+----------+---------------------+ Subclavian    Full       Yes       Yes                                    +----------+------------+---------+-----------+----------+---------------------+ Axillary      None       No        No                                      +----------+------------+---------+-----------+----------+---------------------+ Brachial      Full       Yes       Yes                                    +----------+------------+---------+-----------+----------+---------------------+ Radial        Full                                                        +----------+------------+---------+-----------+----------+---------------------+  Ulnar         Full                                                        +----------+------------+---------+-----------+----------+---------------------+ Cephalic      Full                                                        +----------+------------+---------+-----------+----------+---------------------+ Basilic       Full                                                        +----------+------------+---------+-----------+----------+---------------------+  Left Findings: +----------+------------+---------+-----------+----------+-------+ LEFT      CompressiblePhasicitySpontaneousPropertiesSummary +----------+------------+---------+-----------+----------+-------+ Subclavian    Full       Yes       Yes                      +----------+------------+---------+-----------+----------+-------+  Summary:  Right: No evidence of superficial vein thrombosis in the upper extremity. Findings consistent with acute deep vein thrombosis involving the right axillary vein.  Left: No evidence of thrombosis in the subclavian.  *See table(s) above for measurements and observations.    Preliminary    Procedures Procedures   Medications Ordered in ED Medications  furosemide (LASIX) injection 40 mg (has no administration in time range)  albuterol (VENTOLIN HFA) 108 (90 Base) MCG/ACT inhaler 4 puff (4 puffs Inhalation Given 03/14/20 0946)  ipratropium (ATROVENT HFA) inhaler 4 puff (4 puffs Inhalation Given 03/14/20 0946)  morphine 2 MG/ML injection 2 mg (2 mg Intravenous Given 03/14/20 1008)   ED  Course  I have reviewed the triage vital signs and the nursing notes.  Pertinent labs & imaging results that were available during my care of the patient were reviewed by me and considered in my medical decision making (see chart for details).  Clinical Course as of Mar 14 1305  Tue Mar 14, 2020  0920 oral  Temp: 100.2 F (37.9 C) [LJ]  0920 Pulse Rate(!): 102 [LJ]  0920 10L via NRB.  Patient switched to 4 L via nasal cannula and is saturating around 95%.  SpO2: 100 % [LJ]  1021 Findings suggestive of CHF with interstitial edema. Superimposed infection would be difficult to exclude.  DG Chest Portable 1 View [LJ]  1101 Slightly improved from 8.4, 4 weeks ago.  Hemoglobin(!): 8.8 [LJ]  1101 NEUT#(!): 7.8 [LJ]  1101 Lymphocyte #(!): 0.3 [LJ]  1101 2.22, 4 weeks ago.  Creatinine(!): 2.23 [LJ]  1101 1282, 4 weeks ago.  Alkaline Phosphatase(!): 1,308 [LJ]  1101 Lactic Acid, Venous: 1.9 [LJ]  1102 SARS Coronavirus 2: NEGATIVE [LJ]  1113 Protein(!): >=300 [LJ]  1158 No evidence of superficial vein thrombosis in the upper extremity. Findings consistent with acute deep vein thrombosis involving the right axillary vein.  UE Venous Duplex ([facility] ONLY) [LJ]  1159 Will start patient on heparin, per pharmacy. No history of bleeding, per patient.    [LJ]  1208 B Natriuretic Peptide(!): >4,500.0 [LJ]    Clinical Course User Index [LJ] Rayna Sexton, PA-C   MDM Rules/Calculators/A&P                          Pt is a 54 y.o. female that presents with a history, physical exam, and ED Clinical Course as noted above.   Patient presents today with 2 days of cough, orthopnea, worsening shortness of breath.  Patient recently seen about 1 month ago with lower extremity edema and discharged with a new prescription for Lasix.  She followed up with nephrology who doubled her dose to 80 mg. She denies any relief of her sx with lasix and feels that her edema has continued to worsen in the lower  extremities and now involves the upper extremities and abdomen. CXR shows findings suggestive of CHF with interstitial edema.  They cannot rule out superimposed infection.  Given patient's elevated temperature upon arrival and multitude of symptoms, she initially appeared to be a COVID-19 patient.  She has not been vaccinated for COVID-19.  Her Covid test is actually negative today.  Physical exam is significant for bilateral wheezing.  Her saturations were initially in the mid 70s when EMS arrived.  She was placed on a nonrebreather and was saturating 100% upon arrival.  Patient was placed on 4 L via [place] and is oxygenating in the mid 90s.  Patient was given albuterol as well as ipratropium.    Patient has 2-3+ edema in the lower extremities as well as worsening edema in the bilateral upper extremities, right greater than left.  Ultrasound was obtained of the right upper extremity given the asymmetry of her edema, this was positive for acute DVT along the right axillary vein.  Patient denies any history of bleeding.  No history of rectal bleeding or intracranial hemorrhage.  We will start the patient on heparin, per pharmacy.  We will start patient on IV Lasix.  Given patient's kidney function today, we could not obtain a CT PE study.  Will discuss with hospitalist team for admission and further work-up.  COVID-19 test is negative.  Note: Portions of this report may have been transcribed using voice recognition software. Every effort was made to ensure accuracy; however, inadvertent computerized transcription errors may be present.   Final Clinical Impression(s) / ED Diagnoses Final diagnoses:  Acute deep vein thrombosis (DVT) of right upper extremity, unspecified vein (HCC)  Hypoxia  Shortness of breath   Rx / DC Orders ED Discharge Orders    None       Rayna Sexton, PA-C 03/14/20 1307    Gareth Morgan, MD 03/14/20 2249

## 2020-03-14 NOTE — Hospital Course (Addendum)
[name] is a 54 year old woman with a PMH significant for DMII, GERD, hypertension, renal transplant, CKD, and tobacco use who presents due with 2 days of shortness of breath and orthopnea, 1 day of semi-productive cough and a few months of waxing and waning swelling in all extremities that acutely worsened over the last few days.    Acute respiratory failure Unclear etiology at admission. Differential diagnosis included infection, CHF, DVT/PE, multiple myeloma, and AKI on CKD. Covid negative, not in lactic acidosis, BNP >4500, urine protein >300 mg/dL, WBC 8.8 up from baseline of 3-4 then decreased to 4.9 the next day, ANC 7.8 up from 3.2 four weeks prior, all in the setting of chronic immunosuppression. Briefly febrile to 100.2 on arrival but afebrile thereafter. CXR showing likely CHF vs infection and EKG showing LBB that was present on previous EKG. She was treated with ceftriaxone and azithromycin for possible CAP until CXR on 9/10 showed no focal consolidation and pneumonia appeared unlikely. She was initially diuresed with IV furosemide 40 mg BID but this was soon increased to 80 mg BID. Daily weights and strict I/Os were measured to monitor fluid status. We titrated supplemental O2 as needed. Echocardiogram showed EF 25-30% with diffuse hypokinesis and V/Q scan revealed no pulmonary embolus. Blood cultures drawn at admission never grew and regular CBCs and CMPs were not concerning for infection.   DVT Unclear etiology for the DVT present in her right axillary vein that was found by doppler imaging at admission. Possibly associated with her nephrotic syndrome leading to loss of ATIII. She was treated with increasing doses of Heparin for DVT/PE but was never found to have therapeutic levels of heparin and subsequently was found to have ATIII levels only 57% of normal, although this could have been the result of heparin and not necessarily the cause of her unresponsiveness thereto.  Anticoagulation was thereafter pursued with lovenox, but this was complicated by a 1 gram drop in her Hgb over 4 days, PTT >200, and acute pain in her right upper thigh found on CT to be likely related to swelling of her distal right iliopsoas muscle with subsequent congestion of the femoral canal. This was thought to be hemorrhage, so anticoagulation was stopped until 9/15, when she was started again on lovenox with the plan to bridge her to warfarin.  Atrial fibrillation with RVR This was noted on 9/8-9/9. CHA2DS2-VASc Score = 4 (1 point each for HTN, DM, vascular disease, and gender). Amiodarone IV infusion was ordered, she was loaded with a 150 mg/hr dose and then an initial 67m/hr for 6 hours and then 32mhr after for 18 hours. She was already on heparin and later on lovenox for anticoagulation, although this was stopped for concern for bleeding in her iliopsoas. Her amiodarone was switched to oral 200 mg on 9/13 and changed for metoprolol succinate 25 mg oral daily on 9/16. Anticoagulation was pursued with lovenox once the iliopsoas swelling was found to be due to bursitis.  HFrEF New diagnosis based on echo, 25-30% EF. HF team feels most likely hypertensive cardiomyopathy, possible ischemic cardiomyopathy given CT 4/21 showing multivessel CAD w/ heavily calcified LAD. Amyloid less likely given normal SPEP. Also possibly due to tachycardia from rapid Afib. TSH normal, so not thyroid-related. Diuresis was pursued with Lasix 80 mg BID.  Unna boots were placed to assist with diuresis of edema until 9/15. Noninvasive ischemic workup was performed with NST, revealing previous silent MI as the likely cause of her HFrEF and EF of  30-44%. She was started on low dose Bidil which was then split into separate doses to optimize its effects.  CAD: Prior chest CT 4/21 reviewed and showed multivessel CAD w/ heavily calcified LAD. Not a candidate for LHC currently w/ abnormal renal function. NST revealed previous  MI. Risk stratification was undertaken to minimize risk of future events. HTN is controlled here, but uncontrolled previous to hospitalization. - Lipid panel WNL, but increased atorvastatin to 40 mg daily - DM well controlled on insulin outpatient (A1c 5.1), will start SGLT2 inhibitor by discharge - CKD followed by nephrology here and outpatient - Smoking cessation counseling provided, she is committed to stopping - Diet and exercise: Nutrition counseling in our outpatient clinic after discharge - Metoprolol 25 mg daily   Acute kidney injury in patient with chronic kidney disease stage III s/p transplant Patient presented with worsening of her kidney function over the last several months. She had significant anasarca and proteinuria concerning for nephrotic syndrome vs other paraproteinemia, supported by UPCR in nephrotic range. Free light chain, IFE, and SPEP not concerning for multiple myeloma. Nephrology was consulted and provided recommendations, which were followed. Mycophenolate 540 mg BID, Tacrolimus 3 mg BID, and prednisone 2.5 mg daily were continued for antirejection. Avoided NSAIDs, morphine, baclofen, oral sodium phosphate and mag citrate-based laxatives, and iodinated IV contrast. Monitored strict I/Os. She was diuresed with lasix throughout her stay, with resultant good urine output.  Thigh pain--possible R iliopsoas hematoma/hemorrhage LE doppler revealed no DVT. Non-contrast CT of pelvis and LE revealed asymmetric swelling of distal R iliopsoas muscle, consistent with either bursitis or hemorrhage. It appeared that this was the mostly likely cause of her R thigh pain. Hemorrhage seemed particularly likely given the timeline: her Hgb was 8.8 prior to this pain and had dropped to 7.7 since, with an acute drop to 8.0 around the time she began complaining of the pain, all in the setting of appropriate anticoagulation for her R axillary vein DVT. Anticoagulation was discontinued on 9/11 given  this concern. She was prescribed Dilaudid 1 mg PO PRN q4hrs for her significant pain. CBCs were trended to monitor for bleeding and the need for transfusion. MRI on 9/15 revealed that this swelling was due to bursitis, which allowed [place] to resume anticoagulation. We encouraged mobility and ambulation and PT   DMII SSI Novolog resistant while inpatient.   GERD Her home omeprazole was switched to pantoprazole 40 mg daily.   HTN Held home amlodipine 10 mg daily. Started metoprolol 25 mg daily and she was sent home on this.   Elevated alkaline phosphatase, bony lesions Given her lack of hyperbilirubinemia, her history of various bony lesions on previous scans, anemia, renal insufficiency, and dramatically elevated alk phos now and 4 weeks ago (1308 now and 1282 four weeks ago), we evaluated [name] for multiple myeloma with SPEP and light chains, which were not concerning for malignancy. It appears that her elevated alkaline phosphatase is related to her marked secondary hyperparathyroidism.  Secondary hyperparathyroidism PTH up to 760 from 324 1 year ago. Low Ca, normal phos, and longstanding elevated PTH, consistent with secondary hyperparathyroidism. We restarted home calcitriol 0.5 mcg daily and calcium-vitamin D to 500-200 mg BID, with eventual improvement of her low calcium levels.  Anemia of chronic disease Iron studies and overall picture consistent with anemia of chronic disease. We treated this with 325 mg ferrous sulfate BID as we sought to identify and treat the underlying disorders causing this anemia. She was given 1 unit  of pRBCs and her hemoglobin was 8.4 by discharge.

## 2020-03-14 NOTE — Consult Note (Addendum)
Reason for Consult: Volume overload, acute kidney injury on chronic kidney disease stage III T Referring Physician: Dorian Pod, MD ([facility])  HPI:  54 year old African-American woman with past medical history significant for end-stage renal disease status post cadaveric kidney transplantation on 05/01/2010 at [facility].  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  She also has a history of type 2 diabetes mellitus, hypertension, gastroesophageal reflux disease and tobacco use.  She recently has had worsening of lower extremity edema/lymphedema and was seen by Dr. Marval Regal at [facility] on 03/10/2020 with up-titration of furosemide from 40 mg daily to 40 mg twice daily however, symptoms did not improve over the weekend and she presented over to the emergency room via EMS who found her significantly hypoxic.  In addition to the increasing lower extremity swelling that is limiting her ambulation and causing bilateral leg/knee pain, she reports worsening shortness of breath with cough/sputum production but without any fevers or chills.  She had some nausea and vomiting yesterday that limited her intake of immunosuppressive therapy (tacrolimus, Myfortic and prednisone).  She denies any hemoptysis.  She denies any dysuria, urgency, frequency, flank pain or allograft site pain.  She has mild discomfort over the lower aspect of her abdomen and attributes this to the edema.  Portable chest x-ray done in the emergency room shows interstitial edema consistent with CHF exacerbation and BNP is >4500.  Immunosuppressive regimen: Tacrolimus 4 mg twice daily, Myfortic 540 mg twice daily and prednisone 2.5 mg daily.  Past Medical History:  Diagnosis Date  . Blood transfusion without reported diagnosis   . Diabetes mellitus November 03, 2011   possibly immunosuppresent induced;  CBG 1482 at admission for AMS  . GERD (gastroesophageal reflux disease)    medication induced  . Hypertension   . Renal failure    hx of  . 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 [place]  . CHOLECYSTECTOMY  2008  . COLONOSCOPY  10 years ago   Dr.Hung="normal"  . DG AV DIALYSIS  SHUNT ACCESS EXIST*L* OR    . KIDNEY TRANSPLANT  2011  . UMBILICAL HERNIA REPAIR  2009    Family History  Problem Relation Age of Onset  . Hypertension Mother   . Diabetes Mother   . Atrial fibrillation Father   . Hypertension Father   . Arthritis Father   . Heart failure Sister   . Hypertension Sister        all 4 sisters  . Hypertension Brother        both brothers  . Diabetes Brother   . Colon cancer Neg Hx   . Stomach cancer Neg Hx     Social History:  reports that she has been smoking cigarettes. She has been smoking about 0.50 packs per day. She has never used smokeless tobacco. She reports that she does not drink alcohol and does not use drugs.  Allergies:  Allergies  Allergen Reactions  . Infed [Iron Dextran] Other (See Comments)    Pt has chest pain  Chest pain  . Cefazolin Itching and Nausea And Vomiting  . Ancef [Cefazolin Sodium]  Other (See Comments)    Tolerated rocephin 11/03/10 Reports ancef causes itching, sweating, vomiting (x2-2009?)  . Lisinopril Swelling  . Other     Pt may be allergic to another antibiotic daughter isn't sure and pt unable to verify.    Medications:  Scheduled: . [START ON 03/15/2020] atorvastatin  10 mg Oral Once per day on Mon Wed Fri  . furosemide  40 mg Intravenous BID  . insulin aspart  0-20 Units Subcutaneous TID WC  . mycophenolate  540 mg Oral BID   . pantoprazole  40 mg Oral Daily  . [START ON 03/15/2020] predniSONE  2.5 mg Oral Q breakfast  . sodium chloride flush  3 mL Intravenous Q12H  . tacrolimus  4 mg Oral BID    BMP Latest Ref Rng & Units 03/14/2020 02/15/2020 01/27/2019  Glucose 70 - 99 mg/dL 145(H) 82 95  BUN 6 - 20 mg/dL 26(H) 28(H) 40(H)  Creatinine 0.44 - 1.00 mg/dL 2.23(H) 2.22(H) 1.84(H)  BUN/Creat Ratio 9 - 23 - - 22  Sodium 135 - 145 mmol/L 141 142 142  Potassium 3.5 - 5.1 mmol/L 4.1 4.5 4.2  Chloride 98 - 111 mmol/L 113(H) 113(H) 114(H)  CO2 22 - 32 mmol/L 17(L) 19(L) 13(L)  Calcium 8.9 - 10.3 mg/dL 8.1(L) 8.5(L) 9.0   CBC Latest Ref Rng & Units 03/14/2020 02/15/2020 01/27/2019  WBC 4.0 - 10.5 K/uL 8.8 4.1 4.5  Hemoglobin 12.0 - 15.0 g/dL 8.8(L) 8.4(L) 11.0(L)  Hematocrit 36 - 46 % 30.2(L) 28.5(L) 32.1(L)  Platelets 150 - 400 K/uL 178 145(L) 177   Urinalysis    Component Value Date/Time   COLORURINE YELLOW 03/14/2020 1013   APPEARANCEUR HAZY (A) 03/14/2020 1013   LABSPEC 1.018 03/14/2020 1013   PHURINE 5.0 03/14/2020 1013   GLUCOSEU 50 (A) 03/14/2020 1013   HGBUR SMALL (A) 03/14/2020 1013   BILIRUBINUR NEGATIVE 03/14/2020 1013   KETONESUR NEGATIVE 03/14/2020 1013   PROTEINUR >=300 (A) 03/14/2020 1013   UROBILINOGEN 0.2 02/28/2014 1151   NITRITE NEGATIVE 03/14/2020 1013   LEUKOCYTESUR NEGATIVE 03/14/2020 1013     DG Chest Portable 1 View  Result Date: 03/14/2020 CLINICAL DATA:  Shortness of breath EXAM: PORTABLE CHEST 1 VIEW COMPARISON:  02/15/2020 FINDINGS: Mild cardiomegaly. Pulmonary vascular congestion with increased bibasilar interstitial opacities. No large pleural fluid collection. No pneumothorax. IMPRESSION: Findings suggestive of CHF with interstitial edema. Superimposed infection would be difficult to exclude. Electronically Signed   By: Davina Poke D.O.   On: 03/14/2020 10:10   UE Venous Duplex ([facility] and [facility] ONLY)  Result Date: 03/14/2020 UPPER VENOUS STUDY  Indications: Edema, and SOB  Comparison Study: No prior studies. Performing Technologist: Darlin Coco  Examination Guidelines: A complete evaluation includes B-mode imaging, spectral Doppler, color Doppler, and power Doppler as needed of all accessible portions of each vessel. Bilateral testing is considered an integral part of a complete examination. Limited examinations for reoccurring indications may be performed as noted.  Right Findings: +----------+------------+---------+-----------+----------+---------------------+ RIGHT     CompressiblePhasicitySpontaneousProperties       Summary        +----------+------------+---------+-----------+----------+---------------------+ IJV           Full       Yes       Yes              Appearance of fibrous  synechiae       +----------+------------+---------+-----------+----------+---------------------+ Subclavian    Full       Yes       Yes                                    +----------+------------+---------+-----------+----------+---------------------+ Axillary      None       No        No                                     +----------+------------+---------+-----------+----------+---------------------+ Brachial      Full       Yes       Yes                                    +----------+------------+---------+-----------+----------+---------------------+ Radial        Full                                                        +----------+------------+---------+-----------+----------+---------------------+ Ulnar         Full                                                        +----------+------------+---------+-----------+----------+---------------------+ Cephalic      Full                                                        +----------+------------+---------+-----------+----------+---------------------+ Basilic       Full                                                         +----------+------------+---------+-----------+----------+---------------------+  Left Findings: +----------+------------+---------+-----------+----------+-------+ LEFT      CompressiblePhasicitySpontaneousPropertiesSummary +----------+------------+---------+-----------+----------+-------+ Subclavian    Full       Yes       Yes                      +----------+------------+---------+-----------+----------+-------+  Summary:  Right: No evidence of superficial vein thrombosis in the upper extremity. Findings consistent with acute deep vein thrombosis involving the right axillary vein.  Left: No evidence of thrombosis in the subclavian.  *See table(s) above for measurements and observations.  Diagnosing physician: Deitra Mayo MD Electronically signed by Deitra Mayo MD on 03/14/2020 at 4:07:15 PM.    Final     Review of Systems  Constitutional: Positive for appetite change and fatigue. Negative for activity change, chills and fever.  HENT: Negative for congestion, facial swelling, sinus pressure, sneezing, sore throat and trouble swallowing.   Eyes: Negative for pain and redness.  Respiratory: Positive for cough and shortness of breath. Negative for chest tightness and wheezing.   Cardiovascular: Positive for leg swelling. Negative for chest pain.  Gastrointestinal: Positive for abdominal distention, nausea and vomiting. Negative for abdominal pain and diarrhea.  Endocrine: Negative for polydipsia and polyphagia.  Genitourinary: Negative for dysuria, frequency, hematuria and urgency.  Musculoskeletal: Positive for back pain. Negative for joint swelling and myalgias.  Skin: Negative for rash and wound.  Neurological: Positive for weakness. Negative for dizziness, tremors and light-headedness.  Psychiatric/Behavioral: Negative for confusion. The patient is not nervous/anxious.    Blood pressure 140/67, pulse 71, temperature 100.2 F (37.9 C), temperature source Oral,  resp. rate 15, height 5\' 11"  (1.803 m), weight 78 kg, last menstrual period 11/05/2012, SpO2 100 %. Physical Exam Vitals and nursing note reviewed.  Constitutional:      General: She is not in acute distress.    Appearance: She is well-developed. She is obese.  HENT:     Head: Normocephalic and atraumatic.     Mouth/Throat:     Mouth: Mucous membranes are moist.  Eyes:     Extraocular Movements: Extraocular movements intact.     Pupils: Pupils are equal, round, and reactive to light.  Neck:     Vascular: JVD present.     Comments: 10-11cm JVP Cardiovascular:     Rate and Rhythm: Normal rate and regular rhythm.     Heart sounds: No murmur heard.  No friction rub.  Pulmonary:     Effort: Pulmonary effort is normal.     Breath sounds: Examination of the right-lower field reveals rales. Examination of the left-lower field reveals rales. Rales present. No wheezing or rhonchi.  Abdominal:     General: Bowel sounds are normal.     Tenderness: There is abdominal tenderness.     Comments: Firm on palpation around RLQ/LLQ- skin edema noted  Musculoskeletal:        General: Normal range of motion.     Cervical back: Normal range of motion and neck supple.     Right lower leg: Edema present.     Left lower leg: Edema present.     Comments: 2+ pitting edema superimposed on lymphedema  Lymphadenopathy:     Cervical: No cervical adenopathy.  Skin:    General: Skin is warm and dry.  Neurological:     General: No focal deficit present.     Mental Status: She is alert and oriented to person, place, and time.  Psychiatric:        Mood and Affect: Mood normal.     Assessment/Plan: 1.  Acute kidney injury in patient with chronic kidney disease stage III T: I suspect the current acute kidney injury may be hemodynamically mediated in the setting of decreased effective arterial blood volume/interstitial fluid and ongoing diuresis.  She does not have any acute electrolyte abnormality and the  goal at this time is to attempt volume unloading with intravenous diuresis.  Resume immunosuppressive therapy with tacrolimus and Myfortic/prednisone.  Discussed smoking cessation. Avoid nephrotoxic medications including NSAIDs and iodinated intravenous contrast exposure unless the latter is absolutely indicated.  Preferred narcotic agents for pain control are hydromorphone, fentanyl, and methadone. Morphine should not be used. Avoid Baclofen and avoid oral sodium phosphate and magnesium citrate based laxatives / bowel preps. Continue strict Input and Output monitoring. Will monitor the patient closely with you and intervene or adjust therapy as indicated by changes in clinical status/labs . 2.  Acute hypoxic respiratory failure/volume overload/acute  exacerbation of congestive heart failure: Attempt volume unloading by switching to intravenous diuresis to help improve pharmacodynamics/efficacy.  At this time, we will switch her to intravenous furosemide 40 mg twice daily and review response with diuresis and weights tomorrow.  Agree with additional work-up for CHF evaluation.  She has been started on empiric antimicrobial coverage by the primary service given her immunosuppressed status.  No focal infiltrate seen on chest x-ray and no fever or evidence of infection at this time. 3.  Anemia of chronic kidney disease: Significantly low hemoglobin and hematocrit noted at today's labs, will check iron studies to decide on intravenous iron supplementation plus/minus ESA use. 4.  Hypertension: Elevated blood pressure noted at this time, monitor with diuresis. 5.  Type 2 diabetes mellitus: Resume management with sliding scale insulin per primary service. 6.  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  Evaluation for plasma cell dyscrasia undertaken by primary service.  Mairlyn Tegtmeyer K. 03/14/2020, 6:50 PM

## 2020-03-14 NOTE — H&P (Signed)
Date: 03/14/2020               Patient Name:  [name] MRN: [id number]  DOB: 17-Mar-1966 Age / Sex: 54 y.o., female   PCP: [name], DO              Medical Service: Internal Medicine Teaching Service              Attending Physician: Dr. Jimmye Norman, Elaina Pattee, MD    First Contact: Filiberto Pinks, MS4 Pager: [phone]  Second Contact: Dr. Marianna Payment, DO Pager: [phone]            After Hours (After 5p/  First Contact Pager: [phone]  weekends / holidays): Second Contact Pager: [phone]   Chief Complaint: Shortness of breath  History of Present Illness:   [name] is a 54 year old woman with a PMH significant for DMII, GERD, hypertension, renal transplant, CKD, and tobacco use who presents with 2 days of dyspnea and orthopnea, 1 day of semi-productive cough, and a few months of waxing and waning swelling in all extremities that acutely worsened over the last few days. She says the edema initially arose in patches and would sometimes be only unilateral, but became more widespread and bilateral recently. The swelling in her LEs limits her ability to walk and causes pain in both of her knees. When she coughs, she feels something rattling around in her lungs and occasionally produces clear sputum. She reports having stopped smoking for the last two weeks. She had quit but restarted in the winter due to the stress and grief from her father dying of pneumonia .  Yesterday, when her SOB symptoms suddenly worsened, she became nauseous and spent much of the day dry heaving, unable to eat. She denies vomiting, diarrhea, constipation, abdominal pain, headache, dizziness, sick contacts, and fevers or chills at home. She lives with her daughter and reports maintaining distance from others to avoid getting sick with COVID. Although she has not been vaccinated yet, she readily agrees to be vaccinated during this hospital stay if it is made available to her. She reports that her  outpatient nephrologist had recently increased her lasix to 1m, but that this did nothing to improve her SOB and swelling.  This morning, she awoke with such shortness of breath that she decided to call 911. Upon EMS' arrival, she was satting 76% on RA. She was placed on 10 L via nonrebreather and her saturations increased to 100% by arrival. In the ED, she has been breathing comfortably with supplemental O2 but has been coughing repeatedly, producing a clear sputum during our interview.  Meds: Current Meds  Medication Sig  . amLODipine (NORVASC) 10 MG tablet Take 10 mg by mouth daily.  .Marland Kitchenatorvastatin (LIPITOR) 10 MG tablet Take 10 mg by mouth 3 (three) times a week. Taking on Monday, Wednesday, Friday.  . calcitRIOL (ROCALTROL) 0.5 MCG capsule Take 1.5 mcg by mouth daily.  . Calcium-Phosphorus-Vitamin D (CITRACAL +D3 PO) Take 1 tablet by mouth every Monday, Wednesday, and Friday. Mon, Wed, Friday nights only   . DULoxetine (CYMBALTA) 30 MG capsule TAKE 1 CAPSULE(30 MG) BY MOUTH DAILY (Patient taking differently: Take 30 mg by mouth daily. )  . furosemide (LASIX) 40 MG tablet Take 1 tablet (40 mg total) by mouth daily.  . insulin aspart (NOVOLOG) 100 UNIT/ML injection Inject 8-14 Units into the skin as needed for high blood sugar.   . labetalol (NORMODYNE) 200 MG tablet Take 1 tablet (200  mg total) by mouth 2 (two) times daily.  . magnesium oxide (MAG-OX) 400 MG tablet Take 400 mg by mouth 2 (two) times daily.  . mycophenolate (MYFORTIC) 180 MG EC tablet Take 3 tablets (540 mg total) by mouth 2 (two) times daily.  [name] omeprazole (PRILOSEC) 40 MG capsule Take 40 mg by mouth daily.  . predniSONE (DELTASONE) 2.5 MG tablet Take 2.5 mg by mouth daily.  [name] PROGRAF 1 MG capsule Take 3 mg by mouth 2 (two) times daily.   . vitamin B-12 (CYANOCOBALAMIN) 1000 MCG tablet Take 1 tablet (1,000 mcg total) by mouth daily.     Allergies: Allergies as of 03/14/2020 - Review Complete 03/14/2020  Allergen  Reaction Noted  . Infed [iron dextran] Other (See Comments) 12/09/2011  . Cefazolin Itching and Nausea And Vomiting 11/30/2015  . Ancef [cefazolin sodium] Other (See Comments) 11/03/2011  . Lisinopril Swelling 11/30/2015  . Other  11/03/2011   Past Medical History:  Diagnosis Date  . Blood transfusion without reported diagnosis   . 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 GERD (gastroesophageal reflux disease)    medication induced  . Hypertension   . Renal failure    hx of  . 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 Hypertension Mother   . Diabetes Mother   . Atrial fibrillation Father   . Hypertension Father   . Arthritis Father   . Heart failure Sister   . Hypertension Sister        all 4 sisters  . Hypertension Brother        both brothers  . Diabetes Brother   . Colon cancer Neg Hx   . Stomach cancer Neg Hx    Social History:  Social History   Socioeconomic History  . Marital status: Single    Spouse name: Not on file  . Number of children: 2  . Years of education: 74  . Highest education level: Not on file  Occupational History    Employer: UNEMPLOYED  Tobacco Use  . Smoking status: Current Some Day Smoker    Packs/day: 0.50    Types: Cigarettes    Last attempt to quit: 11/05/2017    Years since quitting: 2.3  . Smokeless tobacco: Never Used  . Tobacco comment: 10 some days   Vaping Use  . Vaping Use: Never used  Substance and Sexual Activity  . Alcohol use: Never    Alcohol/week: 0.0 standard drinks  . Drug use: Never  . Sexual activity: Never  Other Topics Concern  . 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 Difficulty of Paying Living Expenses: Not on file  Food Insecurity:   .  Worried About Charity fundraiser in the Last Year: Not on file  . Ran Out of Food in the Last Year: Not on file  Transportation Needs:   . Lack of Transportation (Medical): Not on file  . Lack of Transportation (Non-Medical): Not on file  Physical Activity:   . Days of Exercise per Week: Not on file  . Minutes of Exercise per Session: Not on file  Stress:   . Feeling of Stress : Not on file  Social Connections:   . Frequency of Communication with Friends and Family: Not on file  . Frequency of Social Gatherings with Friends and Family: Not on file  . Attends Religious Services: Not on file  . Active Member of Clubs or Organizations: Not on file  . Attends Archivist Meetings: Not on file  . Marital Status: Not on file  Intimate Partner Violence:   . Fear of Current or Ex-Partner: Not on file  . Emotionally Abused: Not on file  . Physically Abused: Not on file  . Sexually Abused: Not on file     Review of Systems: A complete ROS was negative except as per HPI.  Physical Exam: Blood pressure 104/85, pulse 76, temperature 100.2 F (37.9 C), temperature source Oral, resp. rate 18, height '5\' 11"'  (1.803 m), weight 78 kg, last menstrual period 11/05/2012, SpO2 100 %. Physical Exam Constitutional:      Appearance: She is obese. She is ill-appearing.  HENT:     Head: Normocephalic and atraumatic.  Cardiovascular:     Rate and Rhythm: Rhythm irregular.  Pulmonary:     Breath sounds: Loud wheezing, rhonchi  and rales present in all lung fields.  Abdominal:     Palpations: Abdomen is soft.  Musculoskeletal:     Right lower leg: Tenderness present. 2+ edema present from foot to patella.     Left lower leg: Tenderness present. 2+ edema present from foot to patella.  Skin:    Coloration: Skin is not cyanotic or pale.     Findings: No ecchymosis, erythema or rash.     Nails: There is no clubbing.  Neurological:     General: No focal deficit present.     Mental Status: She is alert and oriented to person, place, and time.  Psychiatric:        Mood and Affect: Mood normal.        Behavior: Behavior normal.   EKG: personally reviewed, my interpretation is left bundle branch block and sinus tachycardia, although with providers in room her rhythm is irregularly irregular.  CXR: personally reviewed, my interpretation is interstitial edema, possible superimposed infection but most suggestive of CHF.     Assessment & Plan by Problem: Active Problems:   Respiratory failure ([place]) [name] is a 54 year old woman with a PMH significant for DMII, GERD, hypertension, renal transplant, CKD, and tobacco use who presents due with 2 days of shortness of breath and orthopnea, 1 day of semi-productive cough and a few months of waxing and waning swelling in all extremities that acutely worsened over the last few days.   Acute respiratory failure Unclear etiology at the moment. Differential diagnosis is broad and includes infection, CHF, DVT/PE, multiple myeloma, and worsening CKD. Covid negative, not in lactic acidosis, BNP >4500, urine protein >300 mg/dL, WBC 8.8 up from baseline of 3-4, ANC 7.8 up from 3.2 four weeks ago, all in the setting of chronic immunosuppression. Briefly febrile to 100.2 on arrival. CXR showing likely CHF  vs infection and EKG showing LBB that was present on previous . Pursuing the following to determine and treat most likely cause(s): - Ceftriaxone and azithromycin for possible  CAP - Heparin for DVT/PE - Titrate supplemental O2 as needed - Repeat EKG - Echocardiogram results pending, consider V/Q scan depending on results - Blood cultures pending - CBC, CMP, HbA1c, heparin levels, HIV antibody, lactic acid, Mg, Phos, procalcitonin baseline, UPCR, urine Na  DVT Unclear etiology for the DVT present in her right axillary vein. Possibly caused by nephrotic syndrome. - Heparin  CKD s/p cadaveric kidney transplant Patient presents with worsening of her kidney function over the last several months. She has significant anasarca and proteinuria concerning for nephrotic syndrome vs other paraproteinemia.  - Lipid panel pending - Free light chain, IFE, and SPEP pending - Nephrology consult - Mycophenolate 540 mg BID - Tacrolimus 3 mg BID - Checking tacrolimus level  DMII SSI Novolog resistant while inpatient  GERD Pantoprazole 40 mg daily  HTN Holding home amlodipine 10 mg daily for now.  Elevated alkaline phosphatase, bony lesions Given her lack of hyperbilirubinemia, her history of various bony lesions on previous scans, anemia, renal insufficiency, and dramatically elevated alk phos now and 4 weeks ago (1308 now and 1282 four weeks ago), we need to evaluate for multiple myeloma or other causes of bony metastasis. - SPEP, IFE, light chains  Dispo: Admit patient to Inpatient with expected length of stay greater than 2 midnights.  Signed: Mardella Layman, Medical Student 03/14/2020, 2:39 PM  Pager: [phone]   Attestation for Student Documentation:  I personally was present and performed or re-performed the history, physical exam and medical decision-making activities of this service and have verified that the service and findings are accurately documented in the student's note.  Marianna Payment, MD 03/14/2020, 8:18 PM

## 2020-03-14 NOTE — ED Notes (Signed)
ATTEMPTED REPORT X1 

## 2020-03-14 NOTE — Progress Notes (Signed)
Upper extremity venous RT study completed  Preliminary results relayed to PA.  See CV Proc for preliminary results report.   [name]

## 2020-03-14 NOTE — ED Notes (Signed)
IV team present at bedside

## 2020-03-15 ENCOUNTER — Other Ambulatory Visit (HOSPITAL_COMMUNITY): Payer: Medicare Other

## 2020-03-15 LAB — COMPREHENSIVE METABOLIC PANEL
ALT: 15 U/L (ref 0–44)
AST: 17 U/L (ref 15–41)
Albumin: 2.8 g/dL — ABNORMAL LOW (ref 3.5–5.0)
Alkaline Phosphatase: 1039 U/L — ABNORMAL HIGH (ref 38–126)
Anion gap: 8 (ref 5–15)
BUN: 26 mg/dL — ABNORMAL HIGH (ref 6–20)
CO2: 21 mmol/L — ABNORMAL LOW (ref 22–32)
Calcium: 8 mg/dL — ABNORMAL LOW (ref 8.9–10.3)
Chloride: 112 mmol/L — ABNORMAL HIGH (ref 98–111)
Creatinine, Ser: 2.37 mg/dL — ABNORMAL HIGH (ref 0.44–1.00)
GFR calc Af Amer: 26 mL/min — ABNORMAL LOW (ref >60)
GFR calc non Af Amer: 22 mL/min — ABNORMAL LOW (ref >60)
Glucose, Bld: 113 mg/dL — ABNORMAL HIGH (ref 70–99)
Potassium: 3.8 mmol/L (ref 3.5–5.1)
Sodium: 141 mmol/L (ref 135–145)
Total Bilirubin: 0.7 mg/dL (ref 0.3–1.2)
Total Protein: 5.1 g/dL — ABNORMAL LOW (ref 6.5–8.1)

## 2020-03-15 LAB — CBC
HCT: 26.7 % — ABNORMAL LOW (ref 36.0–46.0)
Hemoglobin: 8 g/dL — ABNORMAL LOW (ref 12.0–15.0)
MCH: 29.5 pg (ref 26.0–34.0)
MCHC: 30 g/dL (ref 30.0–36.0)
MCV: 98.5 fL (ref 80.0–100.0)
Platelets: 140 10*3/uL — ABNORMAL LOW (ref 150–400)
RBC: 2.71 MIL/uL — ABNORMAL LOW (ref 3.87–5.11)
RDW: 15.6 % — ABNORMAL HIGH (ref 11.5–15.5)
WBC: 4.9 10*3/uL (ref 4.0–10.5)
nRBC: 0 % (ref 0.0–0.2)

## 2020-03-15 LAB — HEPARIN LEVEL (UNFRACTIONATED)
Heparin Unfractionated: 0.14 IU/mL — ABNORMAL LOW (ref 0.30–0.70)
Heparin Unfractionated: 0.24 IU/mL — ABNORMAL LOW (ref 0.30–0.70)
Heparin Unfractionated: 0.19 IU/mL — ABNORMAL LOW (ref 0.30–0.70)

## 2020-03-15 LAB — IRON AND TIBC
Iron: 22 ug/dL — ABNORMAL LOW (ref 28–170)
Saturation Ratios: 9 % — ABNORMAL LOW (ref 10.4–31.8)
TIBC: 255 ug/dL (ref 250–450)
UIBC: 233 ug/dL

## 2020-03-15 LAB — FERRITIN: Ferritin: 373 ng/mL — ABNORMAL HIGH (ref 11–307)

## 2020-03-15 LAB — GLUCOSE, CAPILLARY
Glucose-Capillary: 104 mg/dL — ABNORMAL HIGH (ref 70–99)
Glucose-Capillary: 111 mg/dL — ABNORMAL HIGH (ref 70–99)
Glucose-Capillary: 124 mg/dL — ABNORMAL HIGH (ref 70–99)
Glucose-Capillary: 144 mg/dL — ABNORMAL HIGH (ref 70–99)

## 2020-03-15 MED ORDER — ALBUTEROL SULFATE (2.5 MG/3ML) 0.083% IN NEBU: 2.5000 mg | INHALATION_SOLUTION | RESPIRATORY_TRACT | Status: DC | PRN

## 2020-03-15 MED ORDER — HEPARIN BOLUS VIA INFUSION
3000.0000 [IU] | Freq: Once | INTRAVENOUS | Status: AC
  Administered 2020-03-15: 3000 [IU] via INTRAVENOUS
  Filled 2020-03-15: qty 3000

## 2020-03-15 MED ORDER — HEPARIN BOLUS VIA INFUSION
2500.0000 [IU] | Freq: Once | INTRAVENOUS | Status: AC
  Administered 2020-03-15: 2500 [IU] via INTRAVENOUS
  Filled 2020-03-15: qty 2500

## 2020-03-15 MED ORDER — TACROLIMUS 1 MG PO CAPS
3.0000 mg | ORAL_CAPSULE | Freq: Two times a day (BID) | ORAL | Status: DC
  Administered 2020-03-15 – 2020-03-20 (×10): 3 mg via ORAL
  Filled 2020-03-15 (×10): qty 3

## 2020-03-15 MED ORDER — FUROSEMIDE 10 MG/ML IJ SOLN
80.0000 mg | Freq: Two times a day (BID) | INTRAMUSCULAR | Status: DC
  Administered 2020-03-15 – 2020-03-17 (×5): 80 mg via INTRAVENOUS
  Filled 2020-03-15 (×6): qty 8

## 2020-03-15 MED ORDER — FERROUS SULFATE 325 (65 FE) MG PO TABS
325.0000 mg | ORAL_TABLET | Freq: Two times a day (BID) | ORAL | Status: DC
  Administered 2020-03-15 – 2020-03-24 (×19): 325 mg via ORAL
  Filled 2020-03-15 (×19): qty 1

## 2020-03-15 NOTE — Progress Notes (Addendum)
[facility] Progress Note    Assessment/ Plan:   Assessment/Plan: 1.  Acute kidney injury in patient with chronic kidney disease stage III T: Baseline Cr is 1.7.  Suspect the current acute kidney injury may be hemodynamically mediated in the setting of decreased effective arterial blood volume/interstitial fluid and ongoing diuresis.  Continue pred 2.5 mg daily, Myfortic 540 mg BID, tacrolimus 3 mg BID (confirmed by pharmacy).   Avoid nephrotoxic medications including NSAIDs and iodinated intravenous contrast exposure unless the latter is absolutely indicated.  Preferred narcotic agents for pain control are hydromorphone, fentanyl, and methadone. Morphine should not be used. Avoid Baclofen and avoid oral sodium phosphate and magnesium citrate based laxatives / bowel preps. Continue strict Input and Output monitoring. Will monitor the patient closely with you and intervene or adjust therapy as indicated by changes in clinical status/labs .  2.  Acute hypoxic respiratory failure/volume overload/acute exacerbation of congestive heart failure:  only 700 mL UOP after 40 IV Lasix.  Still has 4-5L O2 requirement.  Will increase to 80 mg IV BID and follow.  On empiric azithro/ CTX.  3.  Anemia of chronic kidney disease: Significantly low hemoglobin and hematocrit noted at today's labs, iron studies with 9% tsat, allergic to IV iron.  Will give PO BID  4.  Hypertension: Elevated blood pressure noted at this time, monitor with diuresis.  5.  Type 2 diabetes mellitus: Resume management with sliding scale insulin per primary service.  6.  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  Evaluation for plasma cell dyscrasia undertaken by primary service.  Check PTH  Subjective:    Seen in room.  Having SOB, coughing, wheezing.  On hep gtt for acute R arm DVT.     Objective:   BP 138/84 (BP Location: Left  Arm)   Pulse 86   Temp 98.8 F (37.1 C) (Oral)   Resp 15   Ht 5\' 11"  (1.803 m)   Wt 114.5 kg   LMP 11/05/2012   SpO2 100%   BMI 35.21 kg/m   Intake/Output Summary (Last 24 hours) at 03/15/2020 1201 Last data filed at 03/15/2020 0840 Gross per 24 hour  Intake 473 ml  Output 700 ml  Net -227 ml   Weight change:   Physical Exam: Gen: sitting up in bed CVS: RRR  Resp: diffuse inspiratory crackles with expiratory wheezing Abd: nondistended Ext: 3+ edema bilaterally LEs, R arm swelling, no L arm swelling  Imaging: DG Chest Portable 1 View  Result Date: 03/14/2020 CLINICAL DATA:  Shortness of breath EXAM: PORTABLE CHEST 1 VIEW COMPARISON:  02/15/2020 FINDINGS: Mild cardiomegaly. Pulmonary vascular congestion with increased bibasilar interstitial opacities. No large pleural fluid collection. No pneumothorax. IMPRESSION: Findings suggestive of CHF with interstitial edema. Superimposed infection would be difficult to exclude. Electronically Signed   By: Davina Poke D.O.   On: 03/14/2020 10:10   UE Venous Duplex ([facility] and [facility] ONLY)  Result Date: 03/14/2020 UPPER VENOUS STUDY  Indications: Edema, and SOB Comparison Study: No prior studies. Performing Technologist: Darlin Coco  Examination Guidelines: A complete evaluation includes B-mode imaging, spectral Doppler, color Doppler, and power Doppler as needed of all accessible portions of each vessel. Bilateral testing is considered an integral part of a complete examination. Limited examinations for reoccurring indications may be performed as noted.  Right Findings: +----------+------------+---------+-----------+----------+---------------------+ RIGHT     CompressiblePhasicitySpontaneousProperties       Summary        +----------+------------+---------+-----------+----------+---------------------+  IJV           Full       Yes       Yes              Appearance of fibrous                                                            synechiae       +----------+------------+---------+-----------+----------+---------------------+ Subclavian    Full       Yes       Yes                                    +----------+------------+---------+-----------+----------+---------------------+ Axillary      None       No        No                                     +----------+------------+---------+-----------+----------+---------------------+ Brachial      Full       Yes       Yes                                    +----------+------------+---------+-----------+----------+---------------------+ Radial        Full                                                        +----------+------------+---------+-----------+----------+---------------------+ Ulnar         Full                                                        +----------+------------+---------+-----------+----------+---------------------+ Cephalic      Full                                                        +----------+------------+---------+-----------+----------+---------------------+ Basilic       Full                                                        +----------+------------+---------+-----------+----------+---------------------+  Left Findings: +----------+------------+---------+-----------+----------+-------+ LEFT      CompressiblePhasicitySpontaneousPropertiesSummary +----------+------------+---------+-----------+----------+-------+ Subclavian    Full       Yes       Yes                      +----------+------------+---------+-----------+----------+-------+  Summary:  Right: No evidence of superficial vein  thrombosis in the upper extremity. Findings consistent with acute deep vein thrombosis involving the right axillary vein.  Left: No evidence of thrombosis in the subclavian.  *See table(s) above for measurements and observations.  Diagnosing physician: Deitra Mayo MD Electronically signed by Deitra Mayo MD on 03/14/2020 at 4:07:15 PM.    Final     Labs: BMET Recent Labs  Lab 03/14/20 0920 03/14/20 1529 03/15/20 0519  NA 141  --  141  K 4.1  --  3.8  CL 113*  --  112*  CO2 17*  --  21*  GLUCOSE 145*  --  113*  BUN 26*  --  26*  CREATININE 2.23*  --  2.37*  CALCIUM 8.1*  --  8.0*  PHOS  --  3.6  --    CBC Recent Labs  Lab 03/14/20 0920 03/15/20 0519  WBC 8.8 4.9  NEUTROABS 7.8*  --   HGB 8.8* 8.0*  HCT 30.2* 26.7*  MCV 101.3* 98.5  PLT 178 140*    Medications:    . atorvastatin  10 mg Oral Once per day on Mon Wed Fri  . furosemide  80 mg Intravenous BID  . insulin aspart  0-20 Units Subcutaneous TID WC  . mycophenolate  540 mg Oral BID  . pantoprazole  40 mg Oral Daily  . predniSONE  2.5 mg Oral Q breakfast  . sodium chloride flush  3 mL Intravenous Q12H  . tacrolimus  4 mg Oral BID     Madelon Lips MD 03/15/2020, 12:01 PM

## 2020-03-15 NOTE — Progress Notes (Addendum)
Subjective: She feels that her leg swelling has improved from yesterday, although they are still much more swollen than usual. She still feels short of breath but is more comfortable today on 2-3 L O2 with her breathing today than yesterday on 4 L.  Objective:  Vital signs in last 24 hours: Vitals:   03/14/20 2355 03/15/20 0000 03/15/20 0500 03/15/20 0511  BP: 122/82 122/82  128/79  Pulse: 84 75  81  Resp: 15 20  (!) 23  Temp: 98.5 F (36.9 C)   98.7 F (37.1 C)  TempSrc: Oral   Oral  SpO2: 100% 100%  100%  Weight:   114.5 kg   Height:       Weight change:   Intake/Output Summary (Last 24 hours) at 03/15/2020 0657 Last data filed at 03/14/2020 1947 Gross per 24 hour  Intake 353 ml  Output --  Net 353 ml   Physical Exam Constitutional:      Appearance: She is obese. She is slightly uncomfortable-appearing  HENT:     Head: Normocephalic and atraumatic.  Neck:     Vascular: JVD present.  Cardiovascular:     Rate and Rhythm: Rhythm irregular.  Pulmonary:     Breath sounds: Loud rales present in all lung fields. Wheezes present in neck and throughout lung fields. Abdominal:     Palpations: Abdomen is soft and visibly edematous. Musculoskeletal:     Right lower leg: Tenderness present. 3+ edema present from foot to patella.     Left lower leg: Tenderness present. 3+ edema present from foot to patella. Arms: Edematous, peau d'orange skin present on R arm. L arm not swollen.  Skin:    Coloration: Skin is not cyanotic or pale.     Findings: No ecchymosis, erythema or rash.     Nails: There is no clubbing.  Peau d'orange skin present on lower legs. Neurological:     General: No focal deficit present.     Mental Status: She is alert and oriented to person, place, and time.  Psychiatric:        Mood and Affect: Mood normal.        Behavior: Behavior normal.   Labs: BUN 26, Cr 2.37 (2.22 in August 2021, 1.8 in 2020), Ca 8.0, total protein 5.1, Alb 2.8, Alk Phos 1039 this  morning from 1308, GFR 26, Hgb 8.0, WBC 4 at baseline, 8.8 yesterday, 4.9 this morning, Heparin 0.14, lipids WNL, ferritin high at 373, iron low at 22, TIBC low normal 255, iron saturation 9%, UIBC normal 233, A1c 5.0, urine Na 95, UProtein 424, UCr 68.5, UPCr 6235, estimated protein excretion 6.2 g/day, procalcitonin high 2.65, BNP >4500, lactic acid normal 1.9  Pending: PTH, ANA, IFE, Kappa/lamba light chains, SPEP, blood cultures  CXR showed mild cardiomegaly, pulmonary vascular congestion with increased bibasilar interstitial opacities. No large pleural fluid collection. No pneumothorax. Findings suggestive of CHF with interstitial edema.  EKG yesterday showed Sinus tachycardia, Ventricular bigeminy, Left bundle branch block. While in the room it looked like Afib. Repeat pending.  Echo results pending  1300 mL out today from 0700 to 1520   Assessment/Plan:  Active Problems:   Respiratory failure ([place])  Respiratory failure (H[facility]) [name] is a 54 year old woman with a PMH significant for DMII, GERD, hypertension, renal transplant, CKD, and tobacco use who presents due with 2 days of shortness of breath and orthopnea, 1 day of semi-productive cough and a few months of waxing and waning swelling in all  extremities that acutely worsened over the last few days.   Acute respiratory failure Unclear etiology at the moment. Differential diagnosis is broad and includes infection, CHF, DVT/PE, multiple myeloma, and AKI on CKD. Covid negative, not in lactic acidosis, BNP >4500, urine protein >300 mg/dL, WBC 8.8 up from baseline of 3-4 then decreased to 4.9 this morning, ANC 7.8 up from 3.2 four weeks ago, all in the setting of chronic immunosuppression. Briefly febrile to 100.2 on arrival. CXR showing likely CHF vs infection and EKG showing LBB that was present on previous EKG. Pursuing the following to determine and treat most likely cause(s): - Ceftriaxone and azithromycin for possible  CAP - Start PRN albuterol inhaler for wheezing - Diurese with IV furosemide 40 mg BID - Daily weights - Heparin for DVT/PE - Titrate supplemental O2 as needed - Repeat EKG pending - Echocardiogram results pending, consider V/Q scan depending on results - Blood cultures pending - CBC, CMP  DVT Unclear etiology for the DVT present in her right axillary vein. Possibly associated with her nephrotic syndrome. - Heparin  Acute kidney injury in patient with chronic kidney disease stage III s/p transplant Patient presents with worsening of her kidney function over the last several months. She has significant anasarca and proteinuria concerning for nephrotic syndrome vs other paraproteinemia, supported by UPCR in nephrotic range. - Free light chain, IFE, and SPEP pending - Nephrology consulted, appreciate recs - Mycophenolate 540 mg BID - Tacrolimus 3 mg BID - Checking tacrolimus level - Avoid NSAIDs, morphine, baclofen, oral sodium phosphate and mag citrate-based laxatives, and iodinated IV contrast - Strict I/Os  DMII SSI Novolog resistant while inpatient  GERD Pantoprazole 40 mg daily  HTN Holding home amlodipine 10 mg daily for now.  Elevated alkaline phosphatase, bony lesions Given her lack of hyperbilirubinemia, her history of various bony lesions on previous scans, anemia, renal insufficiency, and dramatically elevated alk phos now and 4 weeks ago (1308 now and 1282 four weeks ago), we need to evaluate for multiple myeloma or other causes of bony metastasis. May also be tertiary hyperparathyroidism. - SPEP, IFE, light chains - PTH pending  Anemia of chronic disease Iron studies and overall picture consistent with anemia of chronic disease. She will likely benefit from calcium and vitamin D supplementation and possibly need EPO per nephrology's recommendations. - Will identify and treat underlying disorder(s)   LOS: 1 day   Mardella Layman, Medical  Student 03/15/2020, 6:57 AM

## 2020-03-15 NOTE — Progress Notes (Signed)
ANTICOAGULATION CONSULT NOTE - Follow Up Consult  Pharmacy Consult for Heparin Indication: DVT, RUE  Allergies  Allergen Reactions  . Infed [Iron Dextran] Other (See Comments)    Pt has chest pain  Chest pain  . Cefazolin Itching and Nausea And Vomiting  . Ancef [Cefazolin Sodium] Other (See Comments)    Tolerated rocephin 11/03/10 Reports ancef causes itching, sweating, vomiting (x2-2009?)  . Lisinopril Swelling  . Other     Pt may be allergic to another antibiotic daughter isn't sure and pt unable to verify.    Patient Measurements: Height: 5\' 11"  (180.3 cm) Weight: 114.5 kg (252 lb 6.8 oz) IBW/kg (Calculated) : 70.8 Heparin Dosing Weight: 97 kg  Vital Signs: Temp: 98.4 F (36.9 C) (09/08 2012) Temp Source: Oral (09/08 2012) BP: 132/88 (09/08 2012) Pulse Rate: 84 (09/08 2012)  Labs: Recent Labs    03/14/20 0920 03/14/20 2215 03/15/20 0519 03/15/20 1033 03/15/20 2023  HGB 8.8*  --  8.0*  --   --   HCT 30.2*  --  26.7*  --   --   PLT 178  --  140*  --   --   HEPARINUNFRC  --    < > 0.14* 0.24* 0.19*  CREATININE 2.23*  --  2.37*  --   --    < > = values in this interval not displayed.    Estimated Creatinine Clearance: 36.9 mL/min (A) (by C-G formula based on SCr of 2.37 mg/dL (H)).  Assessment:  51 YOF presenting with SOB/cough, swelling in right arm, found to have DVT in right axillary vein 03/14/20.  Not on anticoagulation PTA, H/H low (chronic) plts wnl.      Heparin level remains subtherapeutic (0.19) after rate increase to 1800 units/hr this am.   Goal of Therapy:  Heparin level 0.3-0.7 units/ml Monitor platelets by anticoagulation protocol: Yes   Plan:   Increase heparin drip to 2000 units/hr  Heparin level ~8 hrs after rate change (draw with AM labs).  Daily heparin level and CBC.  Monitor for signs/symptoms of bleeding.  Follow up for oral anticoagulation plan.   [place] Crane, Roylene Reason, BCCP Clinical Pharmacist  03/15/2020 9:39 PM   [facility]  pharmacy phone numbers are listed on [email or web address]

## 2020-03-15 NOTE — Progress Notes (Signed)
Pt arrived to 4E-24 from ED. CHG bath complete and new gown applied. Tele applied and CCMD notified. VS stable. Will continue to monitor.  Adella Hare, RN

## 2020-03-15 NOTE — Progress Notes (Signed)
ANTICOAGULATION CONSULT NOTE - Follow Up Consult  Pharmacy Consult for Heparin Indication: DVT, RUE  Allergies  Allergen Reactions  . Infed [Iron Dextran] Other (See Comments)    Pt has chest pain  Chest pain  . Cefazolin Itching and Nausea And Vomiting  . Ancef [Cefazolin Sodium] Other (See Comments)    Tolerated rocephin 11/03/10 Reports ancef causes itching, sweating, vomiting (x2-2009?)  . Lisinopril Swelling  . Other     Pt may be allergic to another antibiotic daughter isn't sure and pt unable to verify.    Patient Measurements: Height: 5\' 11"  (180.3 cm) Weight: 114.5 kg (252 lb 6.8 oz) IBW/kg (Calculated) : 70.8 Heparin Dosing Weight: 97 kg  Vital Signs: Temp: 98.8 F (37.1 C) (09/08 1117) Temp Source: Oral (09/08 1117) BP: 138/84 (09/08 1117) Pulse Rate: 86 (09/08 1117)  Labs: Recent Labs    03/14/20 0920 03/14/20 2215 03/15/20 0519 03/15/20 1033  HGB 8.8*  --  8.0*  --   HCT 30.2*  --  26.7*  --   PLT 178  --  140*  --   HEPARINUNFRC  --  <0.10* 0.14* 0.24*  CREATININE 2.23*  --  2.37*  --     Estimated Creatinine Clearance: 36.9 mL/min (A) (by C-G formula based on SCr of 2.37 mg/dL (H)).  Assessment:  70 YOF presenting with SOB/cough, swelling in right arm, found to have DVT in right axillary vein 03/14/20.  Not on anticoagulation PTA, H/H low (chronic) plts wnl.      Heparin level remains subtherapeutic (0.24) after re-bolus and rate increase to 1600 units/hr early am.  Patient reports RUE swelling improved.  CBC trended down some, no bleeding reported.      Dosed on 78 kg on 9/7 but weight recorded as 114.5 kg today.  Patient reports 176 lbs with last office visit ~4 months ago, and weight up due to edema, but did not expect such a difference.  Goal of Therapy:  Heparin level 0.3-0.7 units/ml Monitor platelets by anticoagulation protocol: Yes   Plan:   Heparin 3000 units IV bolus (~2 x drip rate)  Increase heparin drip to 1800 units/hr  Heparin  level ~8 hrs after rate change.  Daily heparin level and CBC.  Monitor for signs/symptoms of bleeding.  Follow up for oral anticoagulation plan.  Arty Baumgartner, [place] Phone: [phone] 03/15/2020,1:04 PM

## 2020-03-15 NOTE — Progress Notes (Signed)
[place] for heparin Indication: DVT  Allergies  Allergen Reactions  . Infed [Iron Dextran] Other (See Comments)    Pt has chest pain  Chest pain  . Cefazolin Itching and Nausea And Vomiting  . Ancef [Cefazolin Sodium] Other (See Comments)    Tolerated rocephin 11/03/10 Reports ancef causes itching, sweating, vomiting (x2-2009?)  . Lisinopril Swelling  . Other     Pt may be allergic to another antibiotic daughter isn't sure and pt unable to verify.    Patient Measurements: Height: 5\' 11"  (180.3 cm) Weight: 114.5 kg (252 lb 6.8 oz) IBW/kg (Calculated) : 70.8 Heparin Dosing Weight: 78 kg  Vital Signs: Temp: 98.7 F (37.1 C) (09/08 0511) Temp Source: Oral (09/08 0511) BP: 128/79 (09/08 0511) Pulse Rate: 81 (09/08 0511)  Labs: Recent Labs    03/14/20 0920 03/14/20 2215 03/15/20 0519  HGB 8.8*  --  8.0*  HCT 30.2*  --  26.7*  PLT 178  --  140*  HEPARINUNFRC  --  <0.10* 0.14*  CREATININE 2.23*  --   --     Estimated Creatinine Clearance: 39.3 mL/min (A) (by C-G formula based on SCr of 2.23 mg/dL (H)). Assessment: 35 YOF presenting with SOB/cough, swelling in right arm, found to have DVT in right axillary vein 03/14/20.  Not on anticoagulation PTA, H/H low (chronic) plts wnl.    Heparin level subtherapeutic (0.14) on gtt at 1400 units/hr. No issues with line or bleeding reported per RN. Hgb 8 (low but relatively stable). Plt down to 140.  Goal of Therapy:  Heparin level 0.3-0.7 units/ml Monitor platelets by anticoagulation protocol: Yes   Plan: Rebolus heparin 2500 units bolus x 1 and increase infusion to 1600 units/hr Will f/u 6 hr heparin level  Sherlon Handing, PharmD, BCPS Please see amion for complete clinical pharmacist phone list 03/15/2020 6:24 AM

## 2020-03-16 ENCOUNTER — Inpatient Hospital Stay (HOSPITAL_COMMUNITY): Payer: Medicare Other

## 2020-03-16 DIAGNOSIS — I340 Nonrheumatic mitral (valve) insufficiency: Secondary | ICD-10-CM

## 2020-03-16 DIAGNOSIS — I480 Paroxysmal atrial fibrillation: Secondary | ICD-10-CM

## 2020-03-16 DIAGNOSIS — R609 Edema, unspecified: Secondary | ICD-10-CM

## 2020-03-16 DIAGNOSIS — I5021 Acute systolic (congestive) heart failure: Secondary | ICD-10-CM

## 2020-03-16 DIAGNOSIS — I361 Nonrheumatic tricuspid (valve) insufficiency: Secondary | ICD-10-CM

## 2020-03-16 DIAGNOSIS — R52 Pain, unspecified: Secondary | ICD-10-CM

## 2020-03-16 DIAGNOSIS — D649 Anemia, unspecified: Secondary | ICD-10-CM

## 2020-03-16 LAB — PROTIME-INR
INR: 1.3 — ABNORMAL HIGH (ref 0.8–1.2)
Prothrombin Time: 15.5 seconds — ABNORMAL HIGH (ref 11.4–15.2)

## 2020-03-16 LAB — GLUCOSE, CAPILLARY
Glucose-Capillary: 154 mg/dL — ABNORMAL HIGH (ref 70–99)
Glucose-Capillary: 114 mg/dL — ABNORMAL HIGH (ref 70–99)
Glucose-Capillary: 182 mg/dL — ABNORMAL HIGH (ref 70–99)
Glucose-Capillary: 126 mg/dL — ABNORMAL HIGH (ref 70–99)

## 2020-03-16 LAB — COMPREHENSIVE METABOLIC PANEL
ALT: 14 U/L (ref 0–44)
AST: 16 U/L (ref 15–41)
Albumin: 2.7 g/dL — ABNORMAL LOW (ref 3.5–5.0)
Alkaline Phosphatase: 1000 U/L — ABNORMAL HIGH (ref 38–126)
Anion gap: 11 (ref 5–15)
BUN: 30 mg/dL — ABNORMAL HIGH (ref 6–20)
CO2: 19 mmol/L — ABNORMAL LOW (ref 22–32)
Calcium: 7.6 mg/dL — ABNORMAL LOW (ref 8.9–10.3)
Chloride: 110 mmol/L (ref 98–111)
Creatinine, Ser: 2.35 mg/dL — ABNORMAL HIGH (ref 0.44–1.00)
GFR calc Af Amer: 26 mL/min — ABNORMAL LOW (ref >60)
GFR calc non Af Amer: 22 mL/min — ABNORMAL LOW (ref >60)
Glucose, Bld: 149 mg/dL — ABNORMAL HIGH (ref 70–99)
Potassium: 3.6 mmol/L (ref 3.5–5.1)
Sodium: 140 mmol/L (ref 135–145)
Total Bilirubin: 0.4 mg/dL (ref 0.3–1.2)
Total Protein: 5.1 g/dL — ABNORMAL LOW (ref 6.5–8.1)

## 2020-03-16 LAB — ECHOCARDIOGRAM COMPLETE
AR max vel: 3.39 cm2
AV Area VTI: 3.65 cm2
AV Area mean vel: 3.42 cm2
AV Mean grad: 5.3 mmHg
AV Peak grad: 10.3 mmHg
Ao pk vel: 1.6 m/s
Calc EF: 32.6 %
Height: 71 in
P 1/2 time: 389 msec
S' Lateral: 5.6 cm
Single Plane A2C EF: 26.2 %
Single Plane A4C EF: 40 %
Weight: 4028.25 oz

## 2020-03-16 LAB — VITAMIN B12: Vitamin B-12: 176 pg/mL — ABNORMAL LOW (ref 180–914)

## 2020-03-16 LAB — ANTITHROMBIN III: AntiThromb III Func: 57 % — ABNORMAL LOW (ref 75–120)

## 2020-03-16 LAB — PROTEIN ELECTROPHORESIS, SERUM
A/G Ratio: 1.3 (ref 0.7–1.7)
Albumin ELP: 2.9 g/dL (ref 2.9–4.4)
Alpha-1-Globulin: 0.3 g/dL (ref 0.0–0.4)
Alpha-2-Globulin: 0.5 g/dL (ref 0.4–1.0)
Beta Globulin: 0.7 g/dL (ref 0.7–1.3)
Gamma Globulin: 0.6 g/dL (ref 0.4–1.8)
Globulin, Total: 2.2 g/dL (ref 2.2–3.9)
Total Protein ELP: 5.1 g/dL — ABNORMAL LOW (ref 6.0–8.5)

## 2020-03-16 LAB — CBC
HCT: 26.9 % — ABNORMAL LOW (ref 36.0–46.0)
Hemoglobin: 8.2 g/dL — ABNORMAL LOW (ref 12.0–15.0)
MCH: 30 pg (ref 26.0–34.0)
MCHC: 30.5 g/dL (ref 30.0–36.0)
MCV: 98.5 fL (ref 80.0–100.0)
Platelets: 162 10*3/uL (ref 150–400)
RBC: 2.73 MIL/uL — ABNORMAL LOW (ref 3.87–5.11)
RDW: 15.6 % — ABNORMAL HIGH (ref 11.5–15.5)
WBC: 4.7 10*3/uL (ref 4.0–10.5)
nRBC: 0.4 % — ABNORMAL HIGH (ref 0.0–0.2)

## 2020-03-16 LAB — TSH: TSH: 1.356 u[IU]/mL (ref 0.350–4.500)

## 2020-03-16 LAB — APTT: aPTT: >200 seconds (ref 24–36)

## 2020-03-16 LAB — ANA W/REFLEX IF POSITIVE: Anti Nuclear Antibody (ANA): NEGATIVE

## 2020-03-16 LAB — HEPARIN LEVEL (UNFRACTIONATED): Heparin Unfractionated: 0.13 IU/mL — ABNORMAL LOW (ref 0.30–0.70)

## 2020-03-16 LAB — PARATHYROID HORMONE, INTACT (NO CA): PTH: 760 pg/mL — ABNORMAL HIGH (ref 15–65)

## 2020-03-16 LAB — KAPPA/LAMBDA LIGHT CHAINS
Kappa free light chain: 59.8 mg/L — ABNORMAL HIGH (ref 3.3–19.4)
Kappa, lambda light chain ratio: 1.22 (ref 0.26–1.65)
Lambda free light chains: 49.2 mg/L — ABNORMAL HIGH (ref 5.7–26.3)

## 2020-03-16 MED ORDER — HYDROMORPHONE HCL 2 MG PO TABS
1.0000 mg | ORAL_TABLET | Freq: Once | ORAL | Status: AC
  Administered 2020-03-16: 1 mg via ORAL
  Filled 2020-03-16: qty 1

## 2020-03-16 MED ORDER — AMIODARONE HCL IN DEXTROSE 360-4.14 MG/200ML-% IV SOLN
30.0000 mg/h | INTRAVENOUS | Status: AC
  Administered 2020-03-16: 30 mg/h via INTRAVENOUS
  Filled 2020-03-16 (×2): qty 200

## 2020-03-16 MED ORDER — ALBUTEROL SULFATE (2.5 MG/3ML) 0.083% IN NEBU
2.5000 mg | INHALATION_SOLUTION | Freq: Four times a day (QID) | RESPIRATORY_TRACT | Status: DC
  Administered 2020-03-16 – 2020-03-17 (×5): 2.5 mg via RESPIRATORY_TRACT
  Filled 2020-03-16 (×5): qty 3

## 2020-03-16 MED ORDER — AMIODARONE IV BOLUS ONLY 150 MG/100ML
150.0000 mg | Freq: Once | INTRAVENOUS | Status: AC
  Administered 2020-03-16: 150 mg via INTRAVENOUS
  Filled 2020-03-16: qty 100

## 2020-03-16 MED ORDER — ENOXAPARIN SODIUM 120 MG/0.8ML ~~LOC~~ SOLN
120.0000 mg | SUBCUTANEOUS | Status: DC
  Administered 2020-03-16: 120 mg via SUBCUTANEOUS
  Filled 2020-03-16 (×2): qty 0.8

## 2020-03-16 MED ORDER — AMIODARONE HCL IN DEXTROSE 360-4.14 MG/200ML-% IV SOLN
60.0000 mg/h | INTRAVENOUS | Status: AC
  Administered 2020-03-16 (×3): 60 mg/h via INTRAVENOUS
  Filled 2020-03-16: qty 200

## 2020-03-16 MED ORDER — CALCIUM CARBONATE-VITAMIN D 500-200 MG-UNIT PO TABS
1.0000 | ORAL_TABLET | Freq: Every day | ORAL | Status: DC
  Administered 2020-03-17: 1 via ORAL
  Filled 2020-03-16: qty 1

## 2020-03-16 MED ORDER — HEPARIN BOLUS VIA INFUSION
2500.0000 [IU] | Freq: Once | INTRAVENOUS | Status: AC
  Administered 2020-03-16: 2500 [IU] via INTRAVENOUS
  Filled 2020-03-16: qty 2500

## 2020-03-16 MED ORDER — CALCITRIOL 0.25 MCG PO CAPS
0.5000 ug | ORAL_CAPSULE | Freq: Every day | ORAL | Status: DC
  Administered 2020-03-16 – 2020-03-24 (×9): 0.5 ug via ORAL
  Filled 2020-03-16 (×9): qty 2

## 2020-03-16 NOTE — Progress Notes (Signed)
Bilateral lower extremity venous duplex completed. Refer to "CV Proc" under chart review to view preliminary results.  03/16/2020 12:06 PM Kelby Aline., MHA, RVT, RDCS, RDMS

## 2020-03-16 NOTE — Progress Notes (Signed)
[facility] Progress Note    Assessment/ Plan:   Assessment/Plan: 1.  Acute kidney injury in patient with chronic kidney disease stage III T: Baseline Cr is 1.7, up to 2.3.  Suspect the current acute kidney injury may be hemodynamically mediated in the setting of decreased effective arterial blood volume/interstitial fluid and ongoing diuresis.  Continue pred 2.5 mg daily, Myfortic 540 mg BID, tacrolimus 3 mg BID (confirmed by pharmacy).   Avoid nephrotoxic medications including NSAIDs and iodinated intravenous contrast exposure unless the latter is absolutely indicated.  Preferred narcotic agents for pain control are hydromorphone, fentanyl, and methadone. Morphine should not be used. Avoid Baclofen and avoid oral sodium phosphate and magnesium citrate based laxatives / bowel preps. Continue strict Input and Output monitoring. Will monitor the patient closely with you and intervene or adjust therapy as indicated by changes in clinical status/labs .  2.  Acute hypoxic respiratory failure/volume overload/acute exacerbation of congestive heart failure:  Improved on increased Lasix of 80 mg IV BID--> continue.  On empiric azithro/ CTX. TTE pending.  3.  Anemia of chronic kidney disease: Significantly low hemoglobin and hematocrit noted at today's labs, iron studies with 9% tsat, allergic to IV iron.  Will give PO BID  4.  Hypertension: Elevated blood pressure noted at this time, monitor with diuresis.  5.  Type 2 diabetes mellitus: Resume management with sliding scale insulin per primary service.  6.  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  Evaluation for plasma cell dyscrasia undertaken by primary service.  Check PTH  7.  RLE swelling- will order venous LE dopplers too.  Subjective:    Breathing is better.  Having R leg pain up to the groin, is more swollen than L.     Objective:   BP  119/68 (BP Location: Left Arm)   Pulse 77   Temp 98.1 F (36.7 C) (Oral)   Resp 18   Ht 5\' 11"  (1.803 m)   Wt 114.2 kg   LMP 11/05/2012   SpO2 100%   BMI 35.11 kg/m   Intake/Output Summary (Last 24 hours) at 03/16/2020 1032 Last data filed at 03/16/2020 0400 Gross per 24 hour  Intake 1015.22 ml  Output 600 ml  Net 415.22 ml   Weight change: 36.2 kg  Physical Exam: Gen: sitting up in bed CVS: RRR  Resp: diffuse inspiratory crackles with expiratory wheezing Abd: nondistended Ext: 3+ edema bilaterally LEs R >L, R arm swelling, no L arm swelling  Imaging: ECHOCARDIOGRAM COMPLETE  Result Date: 03/16/2020    ECHOCARDIOGRAM REPORT   Patient Name:   PANTERA WINTERROWD Date of Exam: 03/16/2020 Medical Rec #:  [id number]       Height:       71.0 in Accession #:    [id number]      Weight:       251.8 lb Date of Birth:  01/26/1966       BSA:          2.325 m Patient Age:    34 years        BP:           137/105 mmHg Patient Gender: F               HR:           119 bpm. Exam Location:  Inpatient Procedure: 2D Echo, Cardiac Doppler and Color Doppler Indications:    Abnormal ECG  History:        Patient has prior history of Echocardiogram examinations, most                 recent 02/23/2007. Arrythmias:Atrial Fibrillation; Risk                 Factors:Hypertension, Diabetes, Current Smoker and Dyslipidemia.                 GERD.  Sonographer:    Clayton Lefort RDCS (AE) Referring Phys: Garrett  1. Left ventricular ejection fraction, by estimation, is 25 to 30%. The left ventricle has severely decreased function. The left ventricle demonstrates global hypokinesis. The left ventricular internal cavity size was severely dilated. There is severe concentric left ventricular hypertrophy. Left ventricular diastolic function could not be evaluated.  2. Right ventricular systolic function is mildly reduced. The right ventricular size is mildly enlarged. There is mildly elevated pulmonary artery  systolic pressure.  3. Left atrial size was severely dilated.  4. Right atrial size was moderately dilated.  5. A small pericardial effusion is present.  6. The mitral valve is abnormal. Mild mitral valve regurgitation. Moderate to severe mitral annular calcification.  7. Tricuspid valve regurgitation is mild to moderate.  8. The aortic valve is tricuspid. There is moderate calcification of the aortic valve. There is moderate thickening of the aortic valve. Aortic valve regurgitation is moderate.  9. Pulmonic valve regurgitation is moderate. 10. Aortic dilatation noted. There is borderline dilatation of the ascending aorta, measuring 38 mm. 11. The inferior vena cava is dilated in size with <50% respiratory variability, suggesting right atrial pressure of 15 mmHg. Comparison(s): Prior images unable to be directly viewed, comparison made by report only. Conclusion(s)/Recommendation(s): Reduced EF with global hypokinesis, in addition to near akinesis of inferior/inferoseptal wall (noted previously). Small pericardial effusion, Very dilated IVC. No clear RA/RV diastolic collapse and no significant respiratory inflow variability to suggest echo evidence of tamponade. however, newly reduced function and elevated right atrial pressure concerning. FINDINGS  Left Ventricle: Left ventricular ejection fraction, by estimation, is 25 to 30%. The left ventricle has severely decreased function. The left ventricle demonstrates global hypokinesis. The left ventricular internal cavity size was severely dilated. There is severe concentric left ventricular hypertrophy. Left ventricular diastolic function could not be evaluated due to atrial fibrillation. Left ventricular diastolic function could not be evaluated. Right Ventricle: The right ventricular size is mildly enlarged. Right vetricular wall thickness was not assessed. Right ventricular systolic function is mildly reduced. There is mildly elevated pulmonary artery systolic  pressure. The tricuspid regurgitant velocity is 2.64 m/s, and with an assumed right atrial pressure of 15 mmHg, the estimated right ventricular systolic pressure is 37.9 mmHg. Left Atrium: Left atrial size was severely dilated. Right Atrium: Right atrial size was moderately dilated. Pericardium: A small pericardial effusion is present. Mitral Valve: The mitral valve is abnormal. There is moderate thickening of the mitral valve leaflet(s). There is moderate calcification of the mitral valve leaflet(s). Moderate to severe mitral annular calcification. Mild mitral valve regurgitation. Tricuspid Valve: The tricuspid valve is grossly normal. Tricuspid valve regurgitation is mild to moderate. Aortic Valve: The aortic valve is tricuspid. There is moderate calcification of the aortic valve. There is moderate thickening of the aortic valve. Aortic valve regurgitation is moderate. Aortic regurgitation PHT measures 389 msec. Aortic valve mean gradient measures 5.3 mmHg. Aortic valve peak gradient measures 10.3 mmHg. Aortic valve area, by VTI measures 3.65 cm. Pulmonic Valve:  The pulmonic valve was not well visualized. Pulmonic valve regurgitation is moderate. No evidence of pulmonic stenosis. Aorta: Aortic dilatation noted. There is borderline dilatation of the ascending aorta, measuring 38 mm. Venous: The inferior vena cava is dilated in size with less than 50% respiratory variability, suggesting right atrial pressure of 15 mmHg. IAS/Shunts: The atrial septum is grossly normal.  LEFT VENTRICLE PLAX 2D LVIDd:         6.30 cm LVIDs:         5.60 cm LV PW:         1.60 cm LV IVS:        2.10 cm LVOT diam:     2.50 cm LV SV:         87 LV SV Index:   38 LVOT Area:     4.91 cm  LV Volumes (MOD) LV vol d, MOD A2C: 168.0 ml LV vol d, MOD A4C: 158.0 ml LV vol s, MOD A2C: 124.0 ml LV vol s, MOD A4C: 94.8 ml LV SV MOD A2C:     44.0 ml LV SV MOD A4C:     158.0 ml LV SV MOD BP:      56.4 ml RIGHT VENTRICLE            IVC RV Basal diam:   3.80 cm    IVC diam: 3.80 cm RV Mid diam:    3.20 cm RV S prime:     5.77 cm/s TAPSE (M-mode): 1.6 cm LEFT ATRIUM              Index       RIGHT ATRIUM           Index LA diam:        5.00 cm  2.15 cm/m  RA Area:     22.80 cm LA Vol (A2C):   74.9 ml  32.21 ml/m RA Volume:   68.90 ml  29.63 ml/m LA Vol (A4C):   106.0 ml 45.59 ml/m LA Biplane Vol: 91.0 ml  39.14 ml/m  AORTIC VALVE AV Area (Vmax):    3.39 cm AV Area (Vmean):   3.42 cm AV Area (VTI):     3.65 cm AV Vmax:           160.33 cm/s AV Vmean:          104.267 cm/s AV VTI:            0.239 m AV Peak Grad:      10.3 mmHg AV Mean Grad:      5.3 mmHg LVOT Vmax:         110.67 cm/s LVOT Vmean:        72.600 cm/s LVOT VTI:          0.178 m LVOT/AV VTI ratio: 0.74 AI PHT:            389 msec  AORTA Ao Root diam: 3.40 cm Ao Asc diam:  3.80 cm TRICUSPID VALVE TR Peak grad:   27.9 mmHg TR Vmax:        264.00 cm/s  SHUNTS Systemic VTI:  0.18 m Systemic Diam: 2.50 cm Buford Dresser MD Electronically signed by Buford Dresser MD Signature Date/Time: 03/16/2020/9:39:02 AM    Final    UE Venous Duplex ([facility] and [facility] ONLY)  Result Date: 03/14/2020 UPPER VENOUS STUDY  Indications: Edema, and SOB Comparison Study: No prior studies. Performing Technologist: Darlin Coco  Examination Guidelines: A complete evaluation includes B-mode imaging, spectral Doppler, color Doppler, and  power Doppler as needed of all accessible portions of each vessel. Bilateral testing is considered an integral part of a complete examination. Limited examinations for reoccurring indications may be performed as noted.  Right Findings: +----------+------------+---------+-----------+----------+---------------------+ RIGHT     CompressiblePhasicitySpontaneousProperties       Summary        +----------+------------+---------+-----------+----------+---------------------+ IJV           Full       Yes       Yes              Appearance of fibrous                                                            synechiae       +----------+------------+---------+-----------+----------+---------------------+ Subclavian    Full       Yes       Yes                                    +----------+------------+---------+-----------+----------+---------------------+ Axillary      None       No        No                                     +----------+------------+---------+-----------+----------+---------------------+ Brachial      Full       Yes       Yes                                    +----------+------------+---------+-----------+----------+---------------------+ Radial        Full                                                        +----------+------------+---------+-----------+----------+---------------------+ Ulnar         Full                                                        +----------+------------+---------+-----------+----------+---------------------+ Cephalic      Full                                                        +----------+------------+---------+-----------+----------+---------------------+ Basilic       Full                                                        +----------+------------+---------+-----------+----------+---------------------+  Left Findings: +----------+------------+---------+-----------+----------+-------+ LEFT  CompressiblePhasicitySpontaneousPropertiesSummary +----------+------------+---------+-----------+----------+-------+ Subclavian    Full       Yes       Yes                      +----------+------------+---------+-----------+----------+-------+  Summary:  Right: No evidence of superficial vein thrombosis in the upper extremity. Findings consistent with acute deep vein thrombosis involving the right axillary vein.  Left: No evidence of thrombosis in the subclavian.  *See table(s) above for measurements and observations.  Diagnosing physician: Deitra Mayo MD  Electronically signed by Deitra Mayo MD on 03/14/2020 at 4:07:15 PM.    Final     Labs: BMET Recent Labs  Lab 03/14/20 0920 03/14/20 1529 03/15/20 0519 03/16/20 0652  NA 141  --  141 140  K 4.1  --  3.8 3.6  CL 113*  --  112* 110  CO2 17*  --  21* 19*  GLUCOSE 145*  --  113* 149*  BUN 26*  --  26* 30*  CREATININE 2.23*  --  2.37* 2.35*  CALCIUM 8.1*  --  8.0* 7.6*  PHOS  --  3.6  --   --    CBC Recent Labs  Lab 03/14/20 0920 03/15/20 0519 03/16/20 0556  WBC 8.8 4.9 4.7  NEUTROABS 7.8*  --   --   HGB 8.8* 8.0* 8.2*  HCT 30.2* 26.7* 26.9*  MCV 101.3* 98.5 98.5  PLT 178 140* 162    Medications:    . atorvastatin  10 mg Oral Once per day on Mon Wed Fri  . ferrous sulfate  325 mg Oral BID WC  . furosemide  80 mg Intravenous BID  . insulin aspart  0-20 Units Subcutaneous TID WC  . mycophenolate  540 mg Oral BID  . pantoprazole  40 mg Oral Daily  . predniSONE  2.5 mg Oral Q breakfast  . sodium chloride flush  3 mL Intravenous Q12H  . tacrolimus  3 mg Oral BID     Madelon Lips MD 03/16/2020, 10:32 AM

## 2020-03-16 NOTE — Consult Note (Addendum)
Advanced Heart Failure Team Consult Note   Primary Physician: [name], DO PCP-Cardiologist:  No primary care provider on file.  Reason for Consultation: Acute Systolic Heart Failure   HPI:    [name] is seen today for evaluation of new systolic heart failure at the request of Dr. Jimmye Norman, Internal Medicine.   54 y/o AAF w/ h/o HTN, T2DM, tobacco abuse, CAD (extensive CAD on prior chest CT) and CKD s/p rt renal transplant in 2011 at [facility]. She is followed by [facility]. She has chronic LBBB but no other prior cardiac history.  She has a +FH of CHF (sister 69).   She presented to [facility] w/ complaints or 4 week history of progressive bilateral LEE, dyspnea, cough and orthopnea. Denies CP. Marked volume overloaded on exam.   ED w/u: COVID negative. BNP >4,500. CXR showed findings suggestive of CHF w/ interstial edema as well as findings concerning for superimposed infection. PCT 2.65. Lactic acid 1.6. Started on abx for possible CAP, ceftriaxone + azithromycin. EKG w/ new atrial fib w/ RVR in the 140s. BMP shows AKI, SCr up to 2.35 (baseline ~1.7).   She was admitted by IM and started on IV Lasix. Echo shows reduced LVEF, 25-30% w/ global hypokinesis and severe concentric LVH. RV mildly reduced. She has mod-severe MR. Moderate AI. Mild-mod TR.   HIV nonreactive. TSH pending. Hgb A1c 5.0.   She has anemia of chronic disease, Hgb 8.0. IDA. Iron low 22. Sats Ratio 9%. Allergic to IV iron. Getting PO ferous sulfate.   Also w/ acute RUE DVT. No recent injury. V/Q scan ordered. She is being treated w/ IV heparin. Concern for possible underlying malignancy. Alk phos elevated at 1,300 and recent whole body bone scan 3/21 that showed diffuse update throughout the axial and appendicular skeleton suspicious for metastatic disease. Primary team is conducting w/u to exclude multiple myeloma. Immunofixation electrophoresis, Kappa/lamdda light chain pending.   Remains abx for presumed CAP.  Afebrile. WBC normal. Blood cultures NGTD.   She is feeling slightly better today but remains markedly volume overloaded. She has converted back to NSR on amio gtt.    Echo 03/16/20 Left ventricular ejection fraction, by estimation, is 25 to 30%. The left ventricle has severely decreased function. The left ventricle demonstrates global hypokinesis. The left ventricular internal cavity size was severely dilated. There is severe concentric left ventricular hypertrophy. Left ventricular diastolic function could not be evaluated. 2. Right ventricular systolic function is mildly reduced. The right ventricular size is mildly enlarged. There is mildly elevated pulmonary artery systolic pressure. 3. Left atrial size was severely dilated. 4. Right atrial size was moderately dilated. 5. A small pericardial effusion is present. 6. The mitral valve is abnormal. Mild mitral valve regurgitation. Moderate to severe mitral annular calcification. 7. Tricuspid valve regurgitation is mild to moderate. 8. The aortic valve is tricuspid. There is moderate calcification of the aortic valve. There is moderate thickening of the aortic valve. Aortic valve regurgitation is moderate. 9. Pulmonic valve regurgitation is moderate. 10. Aortic dilatation noted. There is borderline dilatation of the ascending aorta, measuring 38 mm. 11. The inferior vena cava is dilated in size with <50% respiratory variability, suggesting right atrial pressure of 15 mmHg.  Review of Systems: [y] = yes, '[ ]'  = no   . General: Weight gain '[ ]' ; Weight loss '[ ]' ; Anorexia '[ ]' ; Fatigue '[ ]' ; Fever '[ ]' ; Chills '[ ]' ; Weakness '[ ]'   . Cardiac: Chest pain/pressure '[ ]' ;  Resting SOB [Y ]; Exertional SOB [ Y]; Orthopnea [ Y]; Pedal Edema [Y ]; Palpitations '[ ]' ; Syncope '[ ]' ; Presyncope '[ ]' ; Paroxysmal nocturnal dyspnea[ Y]  . Pulmonary: Cough [ Y]; WheezingY]; Hemoptysis'[ ]' ; Sputum [Y ]; Snoring '[ ]'   . GI: Vomiting'[ ]' ; Dysphagia'[ ]' ; Melena'[ ]' ;  Hematochezia '[ ]' ; Heartburn'[ ]' ; Abdominal pain '[ ]' ; Constipation '[ ]' ; Diarrhea '[ ]' ; BRBPR '[ ]'   . GU: Hematuria'[ ]' ; Dysuria '[ ]' ; Nocturia'[ ]'   . Vascular: Pain in legs with walking '[ ]' ; Pain in feet with lying flat '[ ]' ; Non-healing sores '[ ]' ; Stroke '[ ]' ; TIA '[ ]' ; Slurred speech '[ ]' ;  . Neuro: Headaches'[ ]' ; Vertigo'[ ]' ; Seizures'[ ]' ; Paresthesias'[ ]' ;Blurred vision '[ ]' ; Diplopia '[ ]' ; Vision changes '[ ]'   . Ortho/Skin: Arthritis '[ ]' ; Joint pain '[ ]' ; Muscle pain [ Y]; Joint swelling '[ ]' ; Back Pain '[ ]' ; Rash '[ ]'   . Psych: Depression'[ ]' ; Anxiety'[ ]'   . Heme: Bleeding problems '[ ]' ; Clotting disorders [ Y]; Anemia '[ ]'   . Endocrine: Diabetes [Y ]; Thyroid dysfunction'[ ]'   Home Medications Prior to Admission medications   Medication Sig Start Date End Date Taking? Authorizing Provider  amLODipine (NORVASC) 10 MG tablet Take 10 mg by mouth daily.   Yes [provider]  atorvastatin (LIPITOR) 10 MG tablet Take 10 mg by mouth 3 (three) times a week. Taking on Monday, Wednesday, Friday.   Yes [provider]  calcitRIOL (ROCALTROL) 0.5 MCG capsule Take 1.5 mcg by mouth daily. 01/27/20  Yes [provider]  Calcium-Phosphorus-Vitamin D (CITRACAL +D3 PO) Take 1 tablet by mouth every Monday, Wednesday, and Friday. Mon, Wed, Friday nights only    Yes [provider]  DULoxetine (CYMBALTA) 30 MG capsule TAKE 1 CAPSULE(30 MG) BY MOUTH DAILY Patient taking differently: Take 30 mg by mouth daily.  12/16/19  Yes [name], [name], DO  furosemide (LASIX) 40 MG tablet Take 1 tablet (40 mg total) by mouth daily. 02/16/20  Yes Sherwood Gambler, MD  insulin aspart (NOVOLOG) 100 UNIT/ML injection Inject 8-14 Units into the skin as needed for high blood sugar.    Yes [provider]  labetalol (NORMODYNE) 200 MG tablet Take 1 tablet (200 mg total) by mouth 2 (two) times daily. 06/04/18  Yes [name], [name], DO  magnesium oxide (MAG-OX) 400 MG tablet Take 400 mg by mouth 2 (two) times  daily.   Yes [provider]  mycophenolate (MYFORTIC) 180 MG EC tablet Take 3 tablets (540 mg total) by mouth 2 (two) times daily. 06/04/18  Yes [name], [name], DO  omeprazole (PRILOSEC) 40 MG capsule Take 40 mg by mouth daily. 08/18/19  Yes [provider]  predniSONE (DELTASONE) 2.5 MG tablet Take 2.5 mg by mouth daily. 10/04/19 10/03/20 Yes [provider]  PROGRAF 1 MG capsule Take 3 mg by mouth 2 (two) times daily.  05/15/17  Yes [provider]  vitamin B-12 (CYANOCOBALAMIN) 1000 MCG tablet Take 1 tablet (1,000 mcg total) by mouth daily. 10/11/19  Yes Ladona Horns, MD  traMADol (ULTRAM) 50 MG tablet Take 1 tablet (50 mg total) by mouth every 6 (six) hours as needed. Patient not taking: Reported on 03/14/2020 09/17/19   Marybelle Killings, MD    Past Medical History: Past Medical History:  Diagnosis Date  . Blood transfusion without reported diagnosis   . 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  GERD (gastroesophageal reflux disease)    medication induced  . Hypertension   . Renal failure    hx of  . 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 [place]  . CHOLECYSTECTOMY  2008  . COLONOSCOPY  10 years ago   Dr.Hung="normal"  . DG AV DIALYSIS  SHUNT ACCESS EXIST*L* OR    . KIDNEY TRANSPLANT  2011  . UMBILICAL HERNIA REPAIR  2009    Family History: Family History  Problem Relation Age of Onset  . Hypertension Mother   . Diabetes Mother   . Atrial fibrillation Father   . Hypertension Father   . Arthritis Father   . Heart failure Sister   . Hypertension Sister        all 4  sisters  . Hypertension Brother        both brothers  . Diabetes Brother   . Colon cancer Neg Hx   . Stomach cancer Neg Hx     Social History: Social History   Socioeconomic History  . Marital status: Single    Spouse name: Not on file  . Number of children: 2  . Years of education: 64  . Highest education level: Not on file  Occupational History    Employer: UNEMPLOYED  Tobacco Use  . Smoking status: Current Some Day Smoker    Packs/day: 0.50    Types: Cigarettes    Last attempt to quit: 11/05/2017    Years since quitting: 2.3  . Smokeless tobacco: Never Used  . Tobacco comment: 10 some days   Vaping Use  . Vaping Use: Never used  Substance and Sexual Activity  . Alcohol use: Never    Alcohol/week: 0.0 standard drinks  . Drug use: Never  . Sexual activity: Never  Other Topics Concern  . 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 Difficulty of Paying Living Expenses: Not on file  Food Insecurity:   . Worried About Charity fundraiser in the Last Year: Not on file  . Ran Out of Food in the Last Year: Not on file  Transportation Needs:   . Lack of Transportation (Medical): Not on file  . Lack of Transportation (Non-Medical): Not on file  Physical Activity:   . Days of Exercise per Week: Not on file  . Minutes of Exercise per Session: Not on file  Stress:   . Feeling of Stress : Not on file  Social Connections:   . Frequency of Communication with Friends and Family: Not on file  . Frequency of Social Gatherings with Friends and Family: Not on file  . Attends Religious Services: Not on  file  . Active Member of Clubs or Organizations: Not on file  .  Attends Archivist Meetings: Not on file  . Marital Status: Not on file    Allergies:  Allergies  Allergen Reactions  . Infed [Iron Dextran] Other (See Comments)    Pt has chest pain  Chest pain  . Cefazolin Itching and Nausea And Vomiting  . Ancef [Cefazolin Sodium] Other (See Comments)    Tolerated rocephin 11/03/10 Reports ancef causes itching, sweating, vomiting (x2-2009?)  . Lisinopril Swelling  . Other     Pt may be allergic to another antibiotic daughter isn't sure and pt unable to verify.    Objective:    Vital Signs:   Temp:  [98.1 F (36.7 C)-98.8 F (37.1 C)] 98.1 F (36.7 C) (09/09 0941) Pulse Rate:  [77-135] 77 (09/09 0941) Resp:  [11-22] 18 (09/09 0941) BP: (110-137)/(68-105) 119/68 (09/09 0941) SpO2:  [100 %] 100 % (09/09 0941) Weight:  [114.2 kg] 114.2 kg (09/09 0438)    Weight change: Filed Weights   03/14/20 0903 03/15/20 0500 03/16/20 0438  Weight: 78 kg 114.5 kg 114.2 kg    Intake/Output:   Intake/Output Summary (Last 24 hours) at 03/16/2020 1155 Last data filed at 03/16/2020 0400 Gross per 24 hour  Intake 1015.22 ml  Output 600 ml  Net 415.22 ml      Physical Exam    General:  Fatigue appearing. No resp difficulty HEENT: normal Neck: supple. JVP elevated to jaw . Carotids 2+ bilat; no bruits. No lymphadenopathy or thyromegaly appreciated. Cor: PMI nondisplaced. Regular rate & rhythm. No rubs, gallops or murmurs. Lungs: decreased BS bilaterally, bilateral expiratory wheezing  Abdomen: obese, soft, nontender, nondistended. No hepatosplenomegaly. No bruits or masses. Good bowel sounds. Extremities: no cyanosis, clubbing, rash, 2+ bilateral LE edema up to knees  Neuro: alert & orientedx3, cranial nerves grossly intact. moves all 4 extremities w/o difficulty. Affect pleasant   Telemetry   NSR currently, 80s. PAF w/ RVR 140s   EKG    Atrial Fibrillation w/ RVR 140s  + chronic LBBB  Labs   Basic Metabolic Panel: Recent Labs  Lab 03/14/20 0920 03/14/20 1529 03/15/20 0519 03/16/20 0652  NA 141  --  141 140  K 4.1  --  3.8 3.6  CL 113*  --  112* 110  CO2 17*  --  21* 19*  GLUCOSE 145*  --  113* 149*  BUN 26*  --  26* 30*  CREATININE 2.23*  --  2.37* 2.35*  CALCIUM 8.1*  --  8.0* 7.6*  MG  --  1.8  --   --   PHOS  --  3.6  --   --     Liver Function Tests: Recent Labs  Lab 03/14/20 0920 03/15/20 0519 03/16/20 0652  AST '21 17 16  ' ALT '15 15 14  ' ALKPHOS 1,308* 1,039* 1,000*  BILITOT 0.8 0.7 0.4  PROT 5.5* 5.1* 5.1*  ALBUMIN 3.2* 2.8* 2.7*   No results for input(s): LIPASE, AMYLASE in the last 168 hours. No results for input(s): AMMONIA in the last 168 hours.  CBC: Recent Labs  Lab 03/14/20 0920 03/15/20 0519 03/16/20 0556  WBC 8.8 4.9 4.7  NEUTROABS 7.8*  --   --   HGB 8.8* 8.0* 8.2*  HCT 30.2* 26.7* 26.9*  MCV 101.3* 98.5 98.5  PLT 178 140* 162    Cardiac Enzymes: No results for input(s): CKTOTAL, CKMB, CKMBINDEX, TROPONINI in the last 168 hours.  BNP: BNP (last 3 results) Recent Labs    03/14/20 0920  BNP >4,500.0*  ProBNP (last 3 results) No results for input(s): PROBNP in the last 8760 hours.   CBG: Recent Labs  Lab 03/15/20 0616 03/15/20 1115 03/15/20 1725 03/15/20 2209 03/16/20 0700  GLUCAP 104* 111* 124* 144* 154*    Coagulation Studies: No results for input(s): LABPROT, INR in the last 72 hours.   Imaging   ECHOCARDIOGRAM COMPLETE  Result Date: 03/16/2020    ECHOCARDIOGRAM REPORT   Patient Name:   [name] Date of Exam: 03/16/2020 Medical Rec #:  [id number]       Height:       71.0 in Accession #:    [id number]      Weight:       251.8 lb Date of Birth:  [birth year]       BSA:          2.325 m Patient Age:    87 years        BP:           137/105 mmHg Patient Gender: F               HR:           119 bpm. Exam Location:  Inpatient Procedure: 2D Echo, Cardiac Doppler and Color Doppler  Indications:    Abnormal ECG  History:        Patient has prior history of Echocardiogram examinations, most                 recent 02/23/2007. Arrythmias:Atrial Fibrillation; Risk                 Factors:Hypertension, Diabetes, Current Smoker and Dyslipidemia.                 GERD.  Sonographer:    Clayton Lefort RDCS (AE) Referring Phys: [place]ms  1. Left ventricular ejection fraction, by estimation, is 25 to 30%. The left ventricle has severely decreased function. The left ventricle demonstrates global hypokinesis. The left ventricular internal cavity size was severely dilated. There is severe concentric left ventricular hypertrophy. Left ventricular diastolic function could not be evaluated.  2. Right ventricular systolic function is mildly reduced. The right ventricular size is mildly enlarged. There is mildly elevated pulmonary artery systolic pressure.  3. Left atrial size was severely dilated.  4. Right atrial size was moderately dilated.  5. A small pericardial effusion is present.  6. The mitral valve is abnormal. Mild mitral valve regurgitation. Moderate to severe mitral annular calcification.  7. Tricuspid valve regurgitation is mild to moderate.  8. The aortic valve is tricuspid. There is moderate calcification of the aortic valve. There is moderate thickening of the aortic valve. Aortic valve regurgitation is moderate.  9. Pulmonic valve regurgitation is moderate. 10. Aortic dilatation noted. There is borderline dilatation of the ascending aorta, measuring 38 mm. 11. The inferior vena cava is dilated in size with <50% respiratory variability, suggesting right atrial pressure of 15 mmHg. Comparison(s): Prior images unable to be directly viewed, comparison made by report only. Conclusion(s)/Recommendation(s): Reduced EF with global hypokinesis, in addition to near akinesis of inferior/inferoseptal wall (noted previously). Small pericardial effusion, Very dilated IVC. No clear RA/RV  diastolic collapse and no significant respiratory inflow variability to suggest echo evidence of tamponade. however, newly reduced function and elevated right atrial pressure concerning. FINDINGS  Left Ventricle: Left ventricular ejection fraction, by estimation, is 25 to 30%. The left ventricle has severely decreased function. The left ventricle demonstrates global hypokinesis. The left  ventricular internal cavity size was severely dilated. There is severe concentric left ventricular hypertrophy. Left ventricular diastolic function could not be evaluated due to atrial fibrillation. Left ventricular diastolic function could not be evaluated. Right Ventricle: The right ventricular size is mildly enlarged. Right vetricular wall thickness was not assessed. Right ventricular systolic function is mildly reduced. There is mildly elevated pulmonary artery systolic pressure. The tricuspid regurgitant velocity is 2.64 m/s, and with an assumed right atrial pressure of 15 mmHg, the estimated right ventricular systolic pressure is 43.3 mmHg. Left Atrium: Left atrial size was severely dilated. Right Atrium: Right atrial size was moderately dilated. Pericardium: A small pericardial effusion is present. Mitral Valve: The mitral valve is abnormal. There is moderate thickening of the mitral valve leaflet(s). There is moderate calcification of the mitral valve leaflet(s). Moderate to severe mitral annular calcification. Mild mitral valve regurgitation. Tricuspid Valve: The tricuspid valve is grossly normal. Tricuspid valve regurgitation is mild to moderate. Aortic Valve: The aortic valve is tricuspid. There is moderate calcification of the aortic valve. There is moderate thickening of the aortic valve. Aortic valve regurgitation is moderate. Aortic regurgitation PHT measures 389 msec. Aortic valve mean gradient measures 5.3 mmHg. Aortic valve peak gradient measures 10.3 mmHg. Aortic valve area, by VTI measures 3.65 cm. Pulmonic  Valve: The pulmonic valve was not well visualized. Pulmonic valve regurgitation is moderate. No evidence of pulmonic stenosis. Aorta: Aortic dilatation noted. There is borderline dilatation of the ascending aorta, measuring 38 mm. Venous: The inferior vena cava is dilated in size with less than 50% respiratory variability, suggesting right atrial pressure of 15 mmHg. IAS/Shunts: The atrial septum is grossly normal.  LEFT VENTRICLE PLAX 2D LVIDd:         6.30 cm LVIDs:         5.60 cm LV PW:         1.60 cm LV IVS:        2.10 cm LVOT diam:     2.50 cm LV SV:         87 LV SV Index:   38 LVOT Area:     4.91 cm  LV Volumes (MOD) LV vol d, MOD A2C: 168.0 ml LV vol d, MOD A4C: 158.0 ml LV vol s, MOD A2C: 124.0 ml LV vol s, MOD A4C: 94.8 ml LV SV MOD A2C:     44.0 ml LV SV MOD A4C:     158.0 ml LV SV MOD BP:      56.4 ml RIGHT VENTRICLE            IVC RV Basal diam:  3.80 cm    IVC diam: 3.80 cm RV Mid diam:    3.20 cm RV S prime:     5.77 cm/s TAPSE (M-mode): 1.6 cm LEFT ATRIUM              Index       RIGHT ATRIUM           Index LA diam:        5.00 cm  2.15 cm/m  RA Area:     22.80 cm LA Vol (A2C):   74.9 ml  32.21 ml/m RA Volume:   68.90 ml  29.63 ml/m LA Vol (A4C):   106.0 ml 45.59 ml/m LA Biplane Vol: 91.0 ml  39.14 ml/m  AORTIC VALVE AV Area (Vmax):    3.39 cm AV Area (Vmean):   3.42 cm AV Area (VTI):     3.65 cm AV  Vmax:           160.33 cm/s AV Vmean:          104.267 cm/s AV VTI:            0.239 m AV Peak Grad:      10.3 mmHg AV Mean Grad:      5.3 mmHg LVOT Vmax:         110.67 cm/s LVOT Vmean:        72.600 cm/s LVOT VTI:          0.178 m LVOT/AV VTI ratio: 0.74 AI PHT:            389 msec  AORTA Ao Root diam: 3.40 cm Ao Asc diam:  3.80 cm TRICUSPID VALVE TR Peak grad:   27.9 mmHg TR Vmax:        264.00 cm/s  SHUNTS Systemic VTI:  0.18 m Systemic Diam: 2.50 cm Buford Dresser MD Electronically signed by Buford Dresser MD Signature Date/Time: 03/16/2020/9:39:02 AM    Final        Medications:     Current Medications: . atorvastatin  10 mg Oral Once per day on Mon Wed Fri  . ferrous sulfate  325 mg Oral BID WC  . furosemide  80 mg Intravenous BID  . insulin aspart  0-20 Units Subcutaneous TID WC  . mycophenolate  540 mg Oral BID  . pantoprazole  40 mg Oral Daily  . predniSONE  2.5 mg Oral Q breakfast  . sodium chloride flush  3 mL Intravenous Q12H  . tacrolimus  3 mg Oral BID     Infusions: . amiodarone 30 mg/hr (03/16/20 1119)  . azithromycin 500 mg (03/15/20 1849)  . cefTRIAXone (ROCEPHIN)  IV 1 g (03/15/20 1715)  . heparin 2,250 Units/hr (03/16/20 0934)      Assessment/Plan   1. Acute Systolic Heart Failure: - NYHA Class IIIb w/ marked fluid overload. BNP >4,500  - New diagnosis. EF 25-30 w/ diffuse hypokinesis. RV ok. Severe concentric LVH. No prior studies for comparison.  - suspect hypertensive cardiomyopathy 2/2 long standing HTN - also ? ICM. Chest CT 4/21 shows multivessel CAD w/ heavily calcified LAD. Plan noninvasive ischemic w/u w/ NST. Avoid cath w/ CKD.  - ? Cardiac Amyloid. Primary team conducting w/u for Multiple Myeloma. - ? Tachy mediated from rapid AFib   - Continue IV lasix 80 mg bid - apply unna boots - if unable to diuresis and/or if renal function worsens w/ attempts at diuresis, will need RHC to check hemodynamics/ CO  - no ARB/ARNi, spiro nor dig w/ CKD - consider low dose Bidil after IV diuresis, if BP permits - no  blocker yet w/ acute decompensation - consider future SGLT2i w/ concomitant CKD and DM, pending GFR (will avoid for now given GFR <25)     2. Acute on Chronic Kidney Disease: - s/p rt renal transplant in 2011 at [facility]. She is followed by [facility]  - on prednisone, Myfortic and tacrolimus  - baseline SCr 1.7 - SCr 2.2 on admit, 2.35 today  - suspect AKI 2/2 intravascular volume depletion from 3rd spacing. ? Component of cardiorenal syndrome - apply unna boots and continue to diurese w/ IV Lasix. May  need RHC to check hemodynamics/ CO  3. Atrial Fibrillation:  -new. Went into rapid AF 140s on 9/8 - converted to NSR on amio gtt, now back in AF w/ CVR - continue amio gtt 30 mg/hr  - on IV heparin for  acute DVT. Will need transition to oral agent  - monitor on tele   4. Presumed CAP: - admit CXR suggestive of CHF w/ interstial edema as well as findings concerning for superimposed infection. PCT 2.65. Lactic acid 1.9. AF. Blood cultures negative - continue abx therapy w/ ceftriaxone + azithromycin   5. CAD: - prior chest CT 4/21 reviewed and showed multivessel CAD w/ heavily calcified LAD - not a candidate for LHC currently w/ abnormal renal function  - will plan nuclear study to assess for coronary ischemia prior to d/c  - continue statin, increase atorvastatin to 40 mg night to target LDL < 70  - no ASA w/ chronic anemia and need for anticoagulation for DVT - hold  blocker initiation while in acute decompensated HF  - smoking cessation imperative   6. RUE DVT: - no recent trauma. Concern for malignancy. W/u for MM pending  - V/Q scan pending  - on IV heparin, will need transition to oral anticoagulant   7. Anemia of Chronic Disease/ Iron Deficiency Anemia: - 2/2 CKD - Hgb 8.2 c/w baseline - Fe low. Sats Ratio 9% - Allergic to IV iron - Treat w/ PO, ferrous sulfate 325 BID   8. HTN: - currently well controlled   9. T2DM: - well controlled, Hgb A1c 5.0  - GFR borderline for SGLT2i   10. Tobacco Abuse:  - smokes 1/2 ppm  - smoking cessation advised   11. HLD - known CAD based on Chest CT - recent outpatient LP 6/21 showed elevated LDL at 84 mg/dL - increase home atorvastatin to 40 mg nightly - needs repeat FLP and HFTs in 6-8 weeks   12. Elevated Alk Phos/ Abnormal Bone Scans  - Alk Phos 1,300 on admit  - Had whole body bone scan 3/21 w/ diffuse update throughout the axial and appendicular skeleton suspicious for metastatic disease - undergoing w/u for Multiple  Myeloma     Length of Stay: 2  Lyda Jester, PA-C  03/16/2020, 11:55 AM  Advanced Heart Failure Team Pager [phone] (M-F; 7a - 4p)  Please contact [facility] for night-coverage after hours (4p -7a ) and weekends on [email or web address]  Patient seen and examined with the above-signed Advanced Practice Provider and/or Housestaff. I personally reviewed laboratory data, imaging studies and relevant notes. I independently examined the patient and formulated the important aspects of the plan. I have edited the note to reflect any of my changes or salient points. I have personally discussed the plan with the patient and/or family.  54 y/o woman with morbid obesity, severe HTN, DM2 and CKD s/p renal transplant in 2011 due to HTN renal disease.   Not very functional at baseline. Has been undergoing evaluation for markedly elevated alk phos and + bone scan in 3/21.   Admitted earlier this week with progressive SOB and anasarca. Found to be in AF with RVR. Also has possible COPD flare.  Echo with EF 25-30% global HK. RV mildly HK.   Denies CP. Has extensive coronary calcium on recent noncontrast chest CT particularly in LAD territory  General:  Ill appearing. SOB at rest with audible wheezing HEENT: normal Neck: supple. JVP to jaw  Carotids 2+ bilat; no bruits. No lymphadenopathy or thryomegaly appreciated. Cor: PMI nondisplaced. Irregular 2/6 SEM at RUSB Lungs: diffuse rhonchi and wheezing Abdomen: obsese soft, nontender, nondistended. No hepatosplenomegaly. No bruits or masses. Good bowel sounds. Extremities: no cyanosis, clubbing, rash, 3+ edema Neuro: alert & orientedx3, cranial nerves grossly intact. moves all  4 extremities w/o difficulty. Affect pleasant  Suspect symptoms are combination of acute HF and COPD flare.   Echo with significant LV dysfunction. Suspect probable ischemic CM +/- AF (tachy-related) or hypertensive. Amyloid less likely but not impossible. Serum light chaing mildly  elevated but not in amyloid pattern. SPEP pending.  Unable to do cath duek to CKD.   Would proceed with IV diuresis under Renal's direction. If unable to diurese may need milrinone support. Place UNNA boots. Once respiratory status better will proceed with lexiscan myoview. Agree with IV amio to help maintain NSR (seems like wandering atrial pacemaker today). Continue heparin. Await w/u for underlying malignancy.   Prognosis guarded.   Glori Bickers, MD  4:46 PM

## 2020-03-16 NOTE — Progress Notes (Signed)
RN notified team of patient in atrial fibrillation with RVR on monitor. EKG was performed and patient is in irregular rhythm with no p-waves present, with rate of 130. Suspected new onset AFIB. Patient denies history of AFIB. Denies chest pain. She notes she did feel palpitations prior to our bedside examination. Patient currently volume overloaded, which may have contributed to patient's current cardiac status.   Discussed case with cardiology fellow on call, Dr. Sonia Side.  Appreciated her input in the case, she recommended metoprolol if HR and BP would tolerate. Patient was hypotensive with BP of 113/96. She also has a current echo pending for possible heart failure. Because of this, amiodarone and digoxin were also recommended.   Amiodarone IV infusion ordered, ishe was loaded with a 150 mg/hr dose and then an initial 60mg /hr for 6 hours and then 30mg /hr after for 18 hours. She is currently therapeutic on heparin. V/Q scan also ordered in light of patient's history of right upper extremity DVT. Will continue on cardiac monitor and repeat EKG.   CHA2DS2-VASc Score = 4  The patient's score is based upon: CHF History: 0 HTN History: 1 Age : 0 Diabetes History: 1 Stroke History: 0 Vascular Disease History: 1 Gender: 1

## 2020-03-16 NOTE — Progress Notes (Signed)
ANTICOAGULATION CONSULT NOTE - Initial Consult  Pharmacy Consult for Lovenox Indication: DVT (RUE), rule out PE  Allergies  Allergen Reactions  . Infed [Iron Dextran] Other (See Comments)    Pt has chest pain  Chest pain  . Cefazolin Itching and Nausea And Vomiting  . Ancef [Cefazolin Sodium] Other (See Comments)    Tolerated rocephin 11/03/10 Reports ancef causes itching, sweating, vomiting (x2-2009?)  . Lisinopril Swelling  . Other     Pt may be allergic to another antibiotic daughter isn't sure and pt unable to verify.    Patient Measurements: Height: 5\' 11"  (180.3 cm) Weight: 114.2 kg (251 lb 12.3 oz) IBW/kg (Calculated) : 70.8  Vital Signs: Temp: 98.7 F (37.1 C) (09/09 1235) Temp Source: Oral (09/09 1235) BP: 118/76 (09/09 1235) Pulse Rate: 66 (09/09 1235)  Labs: Recent Labs    03/14/20 0920 03/14/20 2215 03/15/20 0519 03/15/20 0519 03/15/20 1033 03/15/20 2023 03/16/20 0556 03/16/20 0652  HGB 8.8*  --  8.0*  --   --   --  8.2*  --   HCT 30.2*  --  26.7*  --   --   --  26.9*  --   PLT 178  --  140*  --   --   --  162  --   HEPARINUNFRC  --    < > 0.14*   < > 0.24* 0.19* 0.13*  --   CREATININE 2.23*  --  2.37*  --   --   --   --  2.35*   < > = values in this interval not displayed.    Estimated Creatinine Clearance: 37.2 mL/min (A) (by C-G formula based on SCr of 2.35 mg/dL (H)).   Medical History: Past Medical History:  Diagnosis Date  . Blood transfusion without reported diagnosis   . 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 GERD (gastroesophageal reflux disease)    medication induced  . Hypertension   . Renal failure    hx of  . 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 albuterol  2.5 mg Inhalation Q6H  . atorvastatin  10 mg Oral Once per day on Mon Wed Fri  . ferrous sulfate  325 mg Oral BID WC  . furosemide  80 mg Intravenous BID  . insulin aspart  0-20 Units Subcutaneous TID WC  . mycophenolate  540 mg Oral BID  . pantoprazole  40 mg Oral Daily  . predniSONE  2.5 mg Oral Q breakfast  . sodium chloride flush  3 mL Intravenous Q12H  . tacrolimus  3 mg Oral BID    Assessment: 54 YO female initially started on heparin for thrombus in RUE.  Concern for PE due to shortness of breath and evidence of right ventricular dysfunction and increased pulmonary artery pressure.  Patient has consistently been subtherapeutic on heparin drip after multiple boluses and rate increases.   IMTS decided to check AT level which resulted at 57%, indicating patient may have slight AT deficiency.  After discussion with team, decision was made to switch patient to therapeutic Lovenox at a reduced dose of 1mg /kg q24h due to AKI on chronic kidney disease.  9/9 PM: INR, aPTT pending; asked nurse to recheck weight since 78 kg recorded on 9/7 and 115 kg recorded 9/8.  Confirmed that weight is ~114 kg.  Lovenox 1mg /kg q24h would be 114mg  daily > round up to 120 mg/day due to hx of being subtherapeutic on heparin and patient is hypercoagulable.   Goal of Therapy:  Anti-Xa level 0.6-1 units/ml 4hrs after LMWH dose given Monitor platelets by anticoagulation protocol: Yes   Plan:  Lovenox 120mg  daily Check anti-Xa level 4 hours after LMWH dose given Check daily CBC/signs of bleeding  Dimple Nanas, PharmD PGY-1 Acute Care Pharmacy Resident Office: [phone] 03/16/2020 2:13 PM

## 2020-03-16 NOTE — Progress Notes (Signed)
  Amiodarone Drug - Drug Interaction Consult Note  Recommendations: Amiodarone is metabolized by the cytochrome P450 system and therefore has the potential to cause many drug interactions. Amiodarone has an average plasma half-life of 50 days (range 20 to 100 days).   There is potential for drug interactions to occur several weeks or months after stopping treatment and the onset of drug interactions may be slow after initiating amiodarone.   [x]  Statins: Increased risk of myopathy. Simvastatin- restrict dose to 20mg  daily. Other statins: counsel patients to report any muscle pain or weakness immediately.  []  Anticoagulants: Amiodarone can increase anticoagulant effect. Consider warfarin dose reduction. Patients should be monitored closely and the dose of anticoagulant altered accordingly, remembering that amiodarone levels take several weeks to stabilize.  []  Antiepileptics: Amiodarone can increase plasma concentration of phenytoin, the dose should be reduced. Note that small changes in phenytoin dose can result in large changes in levels. Monitor patient and counsel on signs of toxicity.  []  Beta blockers: increased risk of bradycardia, AV block and myocardial depression. Sotalol - avoid concomitant use.  []   Calcium channel blockers (diltiazem and verapamil): increased risk of bradycardia, AV block and myocardial depression.  []   Cyclosporine: Amiodarone increases levels of cyclosporine. Reduced dose of cyclosporine is recommended.  []  Digoxin dose should be halved when amiodarone is started.  []  Diuretics: increased risk of cardiotoxicity if hypokalemia occurs.  []  Oral hypoglycemic agents (glyburide, glipizide, glimepiride): increased risk of hypoglycemia. Patient's glucose levels should be monitored closely when initiating amiodarone therapy.   [x]  Drugs that prolong the QT interval:  Torsades de pointes risk may be increased with concurrent use - avoid if possible.  Monitor QTc, also  keep magnesium/potassium WNL if concurrent therapy can't be avoided. [name] Antibiotics: e.g. fluoroquinolones, erythromycin. . Antiarrhythmics: e.g. quinidine, procainamide, disopyramide, sotalol. . Antipsychotics: e.g. phenothiazines, haloperidol.  . Lithium, tricyclic antidepressants, and methadone.  Tacrolimus:  Tacrolimus and amiodarone are substrates at CYP3A for enzyme metabolism and could potentially be implicated in increasing the concentration of the other agent through competition for metabolism sites.   Thank You,  Excell Seltzer Poteet  03/16/2020 3:12 AM

## 2020-03-16 NOTE — Progress Notes (Addendum)
Pt converted to a-fib RVR with HR in the 130s-140s. BP SpO2 100% on 4L. Internal Medicine Resident Dr. Johnney Ou paged.  MD ordered repeat EKG. EKG showing a-fib RVR HR 140 placed in chart. MD ordered amiodarone drip and nuclear medicine lung scan.   Will continue to monitor.  Adella Hare, RN

## 2020-03-16 NOTE — Progress Notes (Signed)
ANTICOAGULATION CONSULT NOTE - Follow Up Consult  Pharmacy Consult for Heparin Indication: DVT, RUE  Allergies  Allergen Reactions   Infed [Iron Dextran] Other (See Comments)    Pt has chest pain  Chest pain   Cefazolin Itching and Nausea And Vomiting   Ancef [Cefazolin Sodium] Other (See Comments)    Tolerated rocephin 11/03/10 Reports ancef causes itching, sweating, vomiting (x2-2009?)   Lisinopril Swelling   Other     Pt may be allergic to another antibiotic daughter isn't sure and pt unable to verify.    Patient Measurements: Height: 5\' 11"  (180.3 cm) Weight: 114.2 kg (251 lb 12.3 oz) IBW/kg (Calculated) : 70.8 Heparin Dosing Weight: 97 kg  Vital Signs: Temp: 98.7 F (37.1 C) (09/09 0438) Temp Source: Oral (09/09 0438) BP: 137/105 (09/09 0438) Pulse Rate: 119 (09/09 0438)  Labs: Recent Labs    03/14/20 0920 03/14/20 2215 03/15/20 0519 03/15/20 0519 03/15/20 1033 03/15/20 2023 03/16/20 0556 03/16/20 0652  HGB 8.8*  --  8.0*  --   --   --  8.2*  --   HCT 30.2*  --  26.7*  --   --   --  26.9*  --   PLT 178  --  140*  --   --   --  162  --   HEPARINUNFRC  --    < > 0.14*   < > 0.24* 0.19* 0.13*  --   CREATININE 2.23*  --  2.37*  --   --   --   --  2.35*   < > = values in this interval not displayed.    Estimated Creatinine Clearance: 37.2 mL/min (A) (by C-G formula based on SCr of 2.35 mg/dL (H)).  Assessment:  49 YOF presenting with SOB/cough, swelling in right arm, found to have DVT in right axillary vein 03/14/20. Not on anticoagulation PTA. Pt developed AFib RVR 9/8 overnight.  Heparin level remains subtherapeutic this morning despite rate adjustment overnight.  Goal of Therapy:  Heparin level 0.3-0.7 units/ml Monitor platelets by anticoagulation protocol: Yes   Plan:  -Heparin 2500 units x1 -Increase heparin to 2250 units/h -Recheck heparin level in 8h   Arrie Senate, PharmD, BCPS Clinical Pharmacist [phone] Please check AMION for all  [place] numbers 03/16/2020

## 2020-03-16 NOTE — Progress Notes (Signed)
Pharmacy Anticoagulation Note  Anti-Xa (LMWH) levels typically drawn at steady state 4 hours after the 3rd or 4th dose. Patient receiving 1st dose of enoxaparin today. Will d/c level ordered for today with plan to reschedule level at steady state as appropriate.   Arturo Morton, PharmD, BCPS Please check AMION for all [place] contact numbers Clinical Pharmacist 03/16/2020 4:59 PM

## 2020-03-16 NOTE — Progress Notes (Addendum)
Subjective: Atrial fibrillation with RVR began overnight. Amiodarone IV infusion ordered, she was loaded with a 150 mg/hr dose and then an initial 60m/hr for 6 hours and then 396mhr after for 18 hours. She is currently therapeutic on heparin. V/Q scan also ordered in light of patient's history of right upper extremity DVT but this was unsuccessful because she could not lie flat.  She also complained of acute pain in her inner right thigh, which was a new complaint for her, although she says this was present previous to her admission. The pain is worse when she coughs and is an intense, stabbing pain in a band-like distribution diagonally across her upper thigh. She feels like there is something wrong with the vein. She expresses anxiety about what could be causing this pain, particularly if it is a clot.  When told she has a new diagnosis of heart failure, she reports that her sister has had heart failure for years. She reports a large volume of urine overnight.   Objective: Vital signs in last 24 hours: Vitals:   03/16/20 0355 03/16/20 0422 03/16/20 0438 03/16/20 0941  BP: (!) 119/97 119/82 (!) 137/105 119/68  Pulse: (!) 113 (!) 113 (!) 119 77  Resp: 11 (!) '21 20 18  ' Temp:   98.7 F (37.1 C) 98.1 F (36.7 C)  TempSrc:   Oral Oral  SpO2: 100% 100% 100% 100%  Weight:   114.2 kg   Height:       Weight change: 36.2 kg  Intake/Output Summary (Last 24 hours) at 03/16/2020 1413 Last data filed at 03/16/2020 1249 Gross per 24 hour  Intake 1015.22 ml  Output 600 ml  Net 415.22 ml  Note: None recorded from overnight, likely more urine output than this  Physical Exam Constitutional:  Appearance: She is obese. She is slightly uncomfortable-appearing  HENT:  Head: Normocephalicand atraumatic.  Neck:  Vascular: JVDpresent.  Cardiovascular:  Rate and Rhythm: Regular  Pulmonary:  Breath sounds: Soft ralespresent in all lung fields but markedly decreased from  yesterday. Wheezes present in neck and throughout lung fields. Speaking in full, uninterrupted sentences without frequent coughing like yesterday. Abdominal:  Palpations: Abdomen is soft and visibly edematous. Musculoskeletal:  Right lower leg: Tendernesspresent. 2+ edemapresent from foot to thigh. Right leg visibly more swollen than left, particularly in the thigh. Left lower leg: Tendernesspresent. 2+ edemapresent from foot to patella. Arms: Edematous, peau d'orange skin present on R arm. L arm not swollen.  Skin: Coloration: Skin is not cyanoticor pale.  Findings: No ecchymosis,erythemaor rash.  Nails: There is no clubbing.  Peau d'orange skin present on lower legs. Neurological:  General: No focal deficitpresent.  Mental Status: She is alertand oriented to person, place, and time.  Psychiatric:  Mood and Affect: Moodnormal.  Behavior: Behaviornormal.   EKG: A fib with RVR  Echo showing 25-30% EF  Blood cultures NGTD  Assessment/Plan:  Active Problems:   Respiratory failure (HCReadlyn KaTenley Winwards a [age]ear old woman with a PMH significant for DMII, GERD, hypertension, renal transplant,CKD, and tobacco usewho presented with SOB, orthopnea, and anasarca, found to have new diagnoses of HFrEF and atrial fibrillation with RVR.  Acute respiratory failure Improving. Satting 100% on 2 L O2 and lung exam markedly improved from yesterday. Likely related to or at least worsened by HFrEF. -Ceftriaxoneand azithromycin for possible CAP - Switch PRN to scheduled albuterol inhaler for wheezing - Diurese with IV furosemide 80 mg BID - Daily weights - Respiratory viral pathogen panel  ordered - Titrate supplemental O2 as needed - Blood cultures NGTD - CBC, CMP reassuring for no infection  HFrEF New diagnosis today based on echo, 25-30% EF. Unclear etiology, although possibilities include ischemia, HTN, hypothyroidism, infectious, or  other. - Cardiology consulted for recommendations - Lasix 80 mg BID - TSH, respiratory viral pathogen panel ordered - Unna boots  Atrial fibrillation with RVR CHA2DS2-VASc Score = 4 (1 point each for HTN, DM, vascular disease, and gender).  -Amiodarone IV infusion ordered, she was loaded with a 150 mg/hr dose and then an initial 63m/hr for 6 hours and then 387mhr after for 18 hours. She is currently on heparin but repeated up-titrations have been ineffective. V/Q scan also ordered in light of patient's history of right upper extremity DVT but this was unsuccessful due to her inability to lie flat. - Lovenox  DVT Unclear etiology for the DVT present in her right axillary vein. Possibly associated with her nephrotic syndrome. Considering swtiching to alternative anticoagulation given poor response to heparin thus far. HASBLED score 2 points (Risk was 4.1% in one validation study [Lip 2011] and 1.88 bleeds per 100 patient-years in another validation study [Pisters 2010]. Anticoagulation can be considered, however patient does have moderate risk for major bleeding [~2/100 patient-years].) ATIII level 1/2 of normal, so she is deficient. Will need to switch heparin to either LMWH, warfarin, or rivaroxaban. Opting for LMWH for now. - Start 120 mg lovenox daily - Check anti-Xa level 4 hours after LMWH - Daily CBC and monitor for bleeding  Acute kidney injury in patient with chronic kidney disease stage III s/p transplant Patient presents with worsening of her kidney function over the last several months. She had significant anasarca and proteinuria concerning for nephrotic syndrome vs other paraproteinemia, supported by UPCR in nephrotic range. - Free light chain, IFE, and SPEP pending - Nephrology consulted, appreciate recs - Prednisone 2.5 mg daily - Mycophenolate 540 mg BID - Tacrolimus 3 mg BID - Checking tacrolimus level - Avoid NSAIDs, morphine, baclofen, oral sodium phosphate and mag  citrate-based laxatives, and iodinated IV contrast - Strict I/Os  Elevated alkaline phosphatase, bony lesions Given her lack of hyperbilirubinemia, her history of various bony lesions on previous scans, anemia, renal insufficiency, and dramatically elevated alk phos now and 4 weeks ago (1308 now and 1282 four weeks ago), we need to evaluate for multiple myelomaor other causes of bony metastasis. May also be due to her proven secondary hyperparathyroidism. - SPEP, IFE, light chains pending  Secondary hyperparathyroidism PTH up to 760 from 324 1 year ago. Low Ca (corrected Ca 8.6), normal phos, and longstanding elevated PTH, consistent with secondary hyperparathyroidism. - Checking 25-hydroxy vitamin D level - Restart home calcitriol 0.5 mcg daily - Start calcium-vitamin D 500-200 mg daily  Anemia of chronic disease Iron studies and overall picture consistent with anemia of chronic disease. She will likely benefit from calcium and vitamin D supplementation and possibly need EPO per nephrology's recommendations. - Vitamin B12 level pending - 325 mg ferrous sulfate BID - Will identify and treat underlying disorder(s)  Thigh pain Unclear etiology at this time. Worsening with cough is suggestive of venous cause. LE doppler revealed no DVT.  - Tylenol for pain, will consider dilaudid if severe and unremitting  DMII SSI Novolog resistant while inpatient  GERD Pantoprazole 40 mg daily  HTN Holding home amlodipine 10 mg daily for now.   LOS: 2 days   Tracey LaymanMedical Student 03/16/2020, 6:28 AM

## 2020-03-16 NOTE — Progress Notes (Signed)
  Echocardiogram 2D Echocardiogram has been performed.  [name] 03/16/2020, 8:54 AM

## 2020-03-16 NOTE — Progress Notes (Signed)
The patient is a 54 year old female presenting with DVT in the right upper extremity and clinical suspicion for pulmonary embolism. Her past medical history includes DMII, GERD, hypertension, CKD (s/p kidney transplant in 2011), and tobacco use. Because of her reduced kidney function (CrCl~37), the team decided to treat her DVT with UFH with an initial bolus of 4500 units IV and maintenance of 1250 units/hr (per protocol). Her first heparin level (drawn appropriately) was undetectable, and the patient has been subsequently re-bolused four times in a matter of ~36 hours, all of which resulting in a subtherapeutic heparin level. On rounds this morning (03/16/2020), the team discussed how to proceed given her emergent need for anticoagulation and ordered an AT3 level to assess antithrombin deficiency to help guide therapy. The level came back at 57%, indicating she does have a deficiency, thus making heparin not an effective anticoagulant for her, given its mechanism of action. After discussing the situation with Dr. Elie Confer, we have three possible options for the team. The first option is to switch the patient to LMWH one hour after the discontinuation of her heparin drip, at a dose of 1mg /kg SQ every 24 hours, due to her CrCl approaching the 47mL/min cutoff for dose reduction. Pharmacologically, LMWH has less reliance on antithrombin binding requirements than UFH, thus making this a more appropriate choice for a patient with antithrombin deficiency. The second option would be to initiate the patient on warfarin at an appropriate dose per pharmacy protocol, with a five-day overlap with LMWH. After five days of bridging, an INR should be obtained and ideally should be greater than 2. Once the patient has two consecutive INRs greater than 2.0, LMWH can be stopped, and the patient can be maintained on warfarin therapy alone. Of note, the patient would not need to remain in the hospital for this bridging period if there is no  other compelling reason to keep her admitted. The final option would be to initiate rivaroxaban (Xarelto) at the regular DVT treatment dose, given that it is now FDA approved for this in patients with a CrCl as low as 12mL/min. The team agreed upon the first option, for now, hoping to transition the patient to warfarin in the future.

## 2020-03-17 ENCOUNTER — Inpatient Hospital Stay (HOSPITAL_COMMUNITY): Payer: Medicare Other

## 2020-03-17 LAB — CBC
HCT: 25.7 % — ABNORMAL LOW (ref 36.0–46.0)
Hemoglobin: 7.8 g/dL — ABNORMAL LOW (ref 12.0–15.0)
MCH: 29.9 pg (ref 26.0–34.0)
MCHC: 30.4 g/dL (ref 30.0–36.0)
MCV: 98.5 fL (ref 80.0–100.0)
Platelets: 152 10*3/uL (ref 150–400)
RBC: 2.61 MIL/uL — ABNORMAL LOW (ref 3.87–5.11)
RDW: 15.3 % (ref 11.5–15.5)
WBC: 3.9 10*3/uL — ABNORMAL LOW (ref 4.0–10.5)
nRBC: 0 % (ref 0.0–0.2)

## 2020-03-17 LAB — RENAL FUNCTION PANEL
Albumin: 2.6 g/dL — ABNORMAL LOW (ref 3.5–5.0)
Anion gap: 11 (ref 5–15)
BUN: 32 mg/dL — ABNORMAL HIGH (ref 6–20)
CO2: 20 mmol/L — ABNORMAL LOW (ref 22–32)
Calcium: 7.4 mg/dL — ABNORMAL LOW (ref 8.9–10.3)
Chloride: 109 mmol/L (ref 98–111)
Creatinine, Ser: 2.52 mg/dL — ABNORMAL HIGH (ref 0.44–1.00)
GFR calc Af Amer: 24 mL/min — ABNORMAL LOW (ref >60)
GFR calc non Af Amer: 21 mL/min — ABNORMAL LOW (ref >60)
Glucose, Bld: 100 mg/dL — ABNORMAL HIGH (ref 70–99)
Phosphorus: 4 mg/dL (ref 2.5–4.6)
Potassium: 3.3 mmol/L — ABNORMAL LOW (ref 3.5–5.1)
Sodium: 140 mmol/L (ref 135–145)

## 2020-03-17 LAB — GLUCOSE, CAPILLARY
Glucose-Capillary: 109 mg/dL — ABNORMAL HIGH (ref 70–99)
Glucose-Capillary: 122 mg/dL — ABNORMAL HIGH (ref 70–99)
Glucose-Capillary: 155 mg/dL — ABNORMAL HIGH (ref 70–99)

## 2020-03-17 LAB — IMMUNOFIXATION ELECTROPHORESIS
IgA: 117 mg/dL (ref 87–352)
IgG (Immunoglobin G), Serum: 634 mg/dL (ref 586–1602)
IgM (Immunoglobulin M), Srm: 19 mg/dL — ABNORMAL LOW (ref 26–217)
Total Protein ELP: 5.1 g/dL — ABNORMAL LOW (ref 6.0–8.5)

## 2020-03-17 LAB — PARATHYROID HORMONE, INTACT (NO CA): PTH: 857 pg/mL — ABNORMAL HIGH (ref 15–65)

## 2020-03-17 MED ORDER — ALBUTEROL SULFATE (2.5 MG/3ML) 0.083% IN NEBU
2.5000 mg | INHALATION_SOLUTION | Freq: Three times a day (TID) | RESPIRATORY_TRACT | Status: DC
  Administered 2020-03-18 – 2020-03-20 (×5): 2.5 mg via RESPIRATORY_TRACT
  Filled 2020-03-17 (×5): qty 3

## 2020-03-17 MED ORDER — AMIODARONE HCL IN DEXTROSE 360-4.14 MG/200ML-% IV SOLN
30.0000 mg/h | INTRAVENOUS | Status: DC
  Administered 2020-03-17 – 2020-03-19 (×3): 30 mg/h via INTRAVENOUS
  Filled 2020-03-17 (×5): qty 200

## 2020-03-17 MED ORDER — HYDROMORPHONE HCL 2 MG PO TABS
1.0000 mg | ORAL_TABLET | Freq: Once | ORAL | Status: AC
  Administered 2020-03-17: 1 mg via ORAL
  Filled 2020-03-17: qty 1

## 2020-03-17 MED ORDER — FUROSEMIDE 10 MG/ML IJ SOLN
80.0000 mg | Freq: Two times a day (BID) | INTRAMUSCULAR | Status: DC
  Administered 2020-03-17 – 2020-03-18 (×3): 80 mg via INTRAVENOUS
  Filled 2020-03-17 (×3): qty 8

## 2020-03-17 MED ORDER — TECHNETIUM TO 99M ALBUMIN AGGREGATED
4.2000 | Freq: Once | INTRAVENOUS | Status: AC | PRN
  Administered 2020-03-17: 4.2 via INTRAVENOUS

## 2020-03-17 MED ORDER — CYANOCOBALAMIN 500 MCG PO TABS
250.0000 ug | ORAL_TABLET | Freq: Every day | ORAL | Status: DC
  Filled 2020-03-17 (×2): qty 1

## 2020-03-17 MED ORDER — POTASSIUM CHLORIDE CRYS ER 20 MEQ PO TBCR
40.0000 meq | EXTENDED_RELEASE_TABLET | Freq: Once | ORAL | Status: AC
  Administered 2020-03-17: 40 meq via ORAL
  Filled 2020-03-17: qty 2

## 2020-03-17 MED ORDER — GUAIFENESIN-DM 100-10 MG/5ML PO SYRP
5.0000 mL | ORAL_SOLUTION | ORAL | Status: DC | PRN
  Administered 2020-03-17 – 2020-03-21 (×4): 5 mL via ORAL
  Filled 2020-03-17 (×4): qty 5

## 2020-03-17 MED ORDER — FUROSEMIDE 10 MG/ML IJ SOLN: 60.0000 mg | Freq: Two times a day (BID) | INTRAMUSCULAR | Status: DC

## 2020-03-17 MED ORDER — ALBUTEROL SULFATE (2.5 MG/3ML) 0.083% IN NEBU: 2.5000 mg | INHALATION_SOLUTION | Freq: Four times a day (QID) | RESPIRATORY_TRACT | Status: DC | PRN

## 2020-03-17 MED ORDER — ENOXAPARIN SODIUM 120 MG/0.8ML ~~LOC~~ SOLN
120.0000 mg | Freq: Two times a day (BID) | SUBCUTANEOUS | Status: DC
  Administered 2020-03-17 – 2020-03-18 (×3): 120 mg via SUBCUTANEOUS
  Filled 2020-03-17 (×4): qty 0.8

## 2020-03-17 NOTE — Progress Notes (Signed)
Subjective: Her R thigh pain moved overnight. She notes the pain is present when she coughs and is worse with any movement of the right leg. Pain is relieved when leg is positioned closer to midline but hurts with movement of adduction. She notes she is no longer having the pain on the medial aspect of the right thigh, but feels as though it has "jumped" to her anterior right thigh. Pain relieved earlier with 1mg  of dilaudid at 1859 yesterday.    Patient notes history of osteoarthritis and sciatica, but states this is different. Suspect MSK in nature, low suspicion for thromboembolism considering patient had negative right lower extremity US today as well as being on heparin and starting lovenox yesterday. Night team ordered another dose of 1mg  dilaudid.  On morning rounds, the patient reports significant overnight relief with the dilaudid but the pain in her thigh is still intense this morning. She feels like the joint has popped out of the socket. She affirms that this pain is her primary concern today.  When informed about the bony lesions all over her skeleton from prior CTs, she reports that she had significant pain in her ribcage several months ago but never learned what it was until today.  Objective: Vital signs in last 24 hours: Vitals:   03/16/20 2031 03/16/20 2318 03/17/20 0141 03/17/20 0319  BP:  (!) 120/102  130/89  Pulse:  73  84  Resp:  15  18  Temp:  98.4 F (36.9 C)  97.9 F (36.6 C)  TempSrc:  Oral  Oral  SpO2: 100% 100% 100% 100%  Weight:    115.3 kg  Height:       Weight change: -0.1 kg  Intake/Output Summary (Last 24 hours) at 03/17/2020 6160 Last data filed at 03/17/2020 0321 Gross per 24 hour  Intake 645.74 ml  Output 2200 ml  Net -1554.26 ml   Physical Exam Constitutional:  Appearance: She is obese. She is slightly uncomfortable-appearing HENT:  Head: Normocephalicand atraumatic. Cardiovascular:  Rate and Rhythm: Regular  Pulmonary:    Breath sounds: Moderate ralespresent in all lung fields.Wheezes present in neck and throughout lung fields. Speaking in full, uninterrupted sentences with occasional coughing. Abdominal:  Palpations: Abdomen is softand visibly edematous. Musculoskeletal:  Right lower leg: Tendernesspresent.2+ edemapresent from foot to thigh. Right leg visibly more swollen than left, particularly in the thigh. R thigh exquisitely tender to palpation and painful with passive movement of the leg. Left lower leg: Tendernesspresent.2+ edemapresent from foot to patella. Arms: Edematous, peau d'orange skin present on R arm. L arm not swollen. Skin: Coloration: Skin is not cyanoticor pale.  Findings: No ecchymosis,erythemaor rash.  Nails: There is no clubbing. Peau d'orange skin present on lower legs. Neurological:  General: No focal deficitpresent.  Mental Status: She is alertand oriented to person, place, and time.  Psychiatric:  Mood and Affect: Moodnormal.  Behavior: Behaviornormal.  Labs: WBC 3.9 from 4.7, Hgb 7.8 from 8.2, platelets normal at 152 APTT >200, PT 15.5, INR 1.3, vit B12 low at 176, TSH normal at 1.356, ANA negative, Kappa and lambda elevated but ratio normal, SPEP normal, just decreased total protein. Blood cultures NGTD  CXR shows improvement in vascular congestion, no focal infiltrate  Assessment/Plan:  Active Problems:   Respiratory failure (HCC) Rella Egelston is a 54 year old woman with a PMH significant for DMII, GERD, hypertension, renal transplant,CKD, and tobacco usewho presented with SOB, orthopnea, and anasarca, found to have new diagnoses of HFrEF and atrial fibrillation with  RVR.  Acute respiratory failure Improving. Satting 100% on 2 L O2 and lung exam markedly improved from yesterday. Likely related to or at least worsened by HFrEF. CAP less likely now given improving CXR shows no focal  consolidation -Discontinue Ceftriaxoneand azithromycin given unlikely CAP - Scheduled albuterol inhaler for wheezing - Diurese with IV furosemide 80 mg BID - Daily weights - Respiratory viral pathogen panel pending - Titrate supplemental O2 as needed - Blood cultures NGTD - CBC, CMP reassuring for no infection  HFrEF New diagnosis based on echo, 25-30% EF. HF team feels most likely hypertensive cardiomyopathy, possible ischemic cardiomyopathy given CT 4/21 showing multivessel CAD w/ heavily calcified LAD. Amyloid less likely given normal SPEP. Also possibly due to tachycardia from rapid Afib. TSH normal, so not thyroid-related. - Cardiology consulted, appreciate recommendations - Noninvasive ischemic workup with NST, avoid catheterization given CKD - Lasix 80 mg BID - Respiratory viral pathogen panel ordered - Unna boots - Consider low dose Bidil after IV diuresis if BP permits - Consider future SGLT2 inhibitor pending GFR  Atrial fibrillation with RVR CHA2DS2-VAScScore = 4(1 point each for HTN, DM, vascular disease, and gender).  - Continue amiodarone IV infusion 30 mg/hr - Lovenox - Monitor on tele  CAD: Prior chest CT 4/21 reviewed and showed multivessel CAD w/ heavily calcified LAD. Not a candidate for LHC currently w/ abnormal renal function  - HF team will plan nuclear study to assess for coronary ischemia prior to d/c  - will wait to increase atorvastatin to 40 mg night to target LDL < 70 given acute possibly muscular pain in the setting of new amiodarone infusion - no ASA w/ chronic anemia and need for anticoagulation for DVT - hold ? blocker initiation while in acute decompensated HF  - smoking cessation imperative: nurse to provide smoking cessation education, we will assess need for medication assistance for cessation  DVT Unclear etiology for the DVT present in her right axillary vein. Possiblyassociated with hernephrotic syndrome leading to ATIII level 57% of  normal. Switched to LMWH 9/9. PTT >200 today. - 120 mg lovenox daily - Check anti-Xa level 4 hours after 3rd dose of LMWH - Daily CBC and monitor for bleeding  Acute kidney injury in patient with chronic kidney disease stage IIIs/p transplant Patient presents with worsening of her kidney function over the last several months. Significant anasarca and proteinuria concerning for nephrotic syndrome vs other paraproteinemia, supported by UPCR in nephrotic range. Free light chain and SPEP unconcerning for malignancy. - Nephrology consulted, appreciate recs - Prednisone 2.5 mg daily - Mycophenolate 540 mg BID - Tacrolimus 3 mg BID - Checking tacrolimus level - Avoid NSAIDs, morphine, baclofen, oral sodium phosphate and mag citrate-based laxatives, and iodinated IV contrast - Strict I/Os - Daily CMP  Elevated alkaline phosphatase, bony lesions Likely due to longstanding secondary hyperparathyroidism given reassuring SPEP.  Secondary hyperparathyroidism PTH up to 760 from 324 1 year ago. Low Ca, normal phos, and longstanding elevated PTH, consistent with secondary hyperparathyroidism. - Pending 25-hydroxy vitamin D level - Restart home calcitriol 0.5 mcg daily - Start calcium-vitamin D 500-200 mg daily  Anemia of chronic disease Iron studies and overall picture consistent with anemia of chronic disease. She will likely benefit from calcium and vitamin D supplementation and possibly need EPO per nephrology's recommendations. - Vitamin B12 level low, will supplement with 250 mcg B12 daily - 325 mg ferrous sulfate BID  Thigh pain Unclear etiology at this time. Worsening with cough and passive movement are  concerning for osseous injury. This is even more likely given her CT in 4/21 that showed diffuse osseous lesions and fractures. LE doppler revealed no DVT. - Non-contrast CT of pelvis and LE - Dilaudid PRN - PT/OT eval if CT clear  DMII SSI Novolog resistant while  inpatient  GERD Pantoprazole 40 mg daily  HTN Holding home amlodipine 10 mg daily for now.   LOS: 3 days   Mardella Layman, Medical Student 03/17/2020, 7:22 AM

## 2020-03-17 NOTE — Progress Notes (Signed)
Notified by patient's RN that patient woke up with right lower extremity pain. Upon my bedside evaluation patient is tender to palpate on anterior aspect at proximal right thigh. She notes the pain is present when she coughs and is worse with any movement of the right leg. Pain is relieved when leg is positioned closer to midline but hurts with movement of adduction. She notes she is no longer having the pain on the medial aspect of the right thigh, but it feels as though it has "jumped" to her anterior right thigh. Pain relieved earlier with 1mg  of dilaudid at 1859 yesterday.    Patient notes history of osteoarthritis and sciatica, but states this is different. Suspect MSK in nature, low suspicion for thromboembolism considering patient had negative right lower extremity US today as well as being on heparin and starting lovenox yesterday. Will relay information to day team, consider possible PT evaluation as patient has been sedentary since being admitted with limited movement. Will order another dose of 1mg  dilaudid.

## 2020-03-17 NOTE — Progress Notes (Signed)
[facility] for Lovenox Indication: DVT (RUE), rule out PE  Allergies  Allergen Reactions  . Infed [Iron Dextran] Other (See Comments)    Pt has chest pain  Chest pain  . Cefazolin Itching and Nausea And Vomiting  . Ancef [Cefazolin Sodium] Other (See Comments)    Tolerated rocephin 11/03/10 Reports ancef causes itching, sweating, vomiting (x2-2009?)  . Lisinopril Swelling  . Other     Pt may be allergic to another antibiotic daughter isn't sure and pt unable to verify.    Patient Measurements: Height: 5\' 11"  (180.3 cm) Weight: 115.3 kg (254 lb 3.1 oz) IBW/kg (Calculated) : 70.8  Vital Signs: Temp: 98.5 F (36.9 C) (09/10 0844) Temp Source: Oral (09/10 0844) BP: 115/67 (09/10 0844) Pulse Rate: 84 (09/10 0844)  Labs: Recent Labs    03/15/20 0519 03/15/20 0519 03/15/20 1033 03/15/20 2023 03/16/20 0556 03/16/20 0652 03/16/20 1525 03/17/20 0630  HGB 8.0*   < >  --   --  8.2*  --   --  7.8*  HCT 26.7*  --   --   --  26.9*  --   --  25.7*  PLT 140*  --   --   --  162  --   --  152  APTT  --   --   --   --   --   --  >200*  --   LABPROT  --   --   --   --   --   --  15.5*  --   INR  --   --   --   --   --   --  1.3*  --   HEPARINUNFRC 0.14*   < > 0.24* 0.19* 0.13*  --   --   --   CREATININE 2.37*  --   --   --   --  2.35*  --  2.52*   < > = values in this interval not displayed.    Estimated Creatinine Clearance: 34.9 mL/min (A) (by C-G formula based on SCr of 2.52 mg/dL (H)).   Medical History: Past Medical History:  Diagnosis Date  . Blood transfusion without reported diagnosis   . 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 GERD (gastroesophageal reflux disease)    medication induced  . Hypertension   . Renal failure    hx of  . 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 albuterol  2.5 mg Inhalation Q6H  . atorvastatin  10 mg Oral Once per day on Mon Wed Fri  . calcitRIOL  0.5 mcg Oral Daily  . calcium-vitamin D  1 tablet Oral Q breakfast  . enoxaparin (LOVENOX) injection  120 mg Subcutaneous Q12H  . ferrous sulfate  325 mg Oral BID WC  . furosemide  80 mg Intravenous BID  . insulin aspart  0-20 Units Subcutaneous TID WC  . mycophenolate  540 mg Oral BID  . pantoprazole  40 mg Oral Daily  . predniSONE  2.5 mg Oral Q breakfast  . sodium chloride flush  3 mL Intravenous Q12H  . tacrolimus  3 mg Oral BID  Assessment: 54 YO female initially started on heparin for thrombus in RUE.  Concern for PE due to shortness of breath and evidence of right ventricular dysfunction and increased pulmonary artery pressure. Negative for DVT in LE. Pharmacy asked to switch IV heparin to Lovenox given continued subtherapeutic levels and AT3 deficiency.  Patient with worsening renal function but making good uop with IV lasix. Will adjust dose frequency and monitor renal function/UOP closely. Hgb slow trend down, plts wnl. No overt bleeding noted.  Goal of Therapy:  Anti-Xa level 0.6-1 units/ml 4hrs after LMWH dose given at steady state Monitor platelets by anticoagulation protocol: Yes   Plan:  Lovenox 120mg  SQ Q12 hrs Check anti-Xa level at steady state Monitor daily CBC, s/sx bleeding F/u long-term PO AC plans  Richardine Service, PharmD PGY2 Cardiology Pharmacy Resident Phone: [phone] 03/17/2020  10:19 AM  Please check [email or web address] for unit-specific pharmacy phone numbers.

## 2020-03-17 NOTE — Progress Notes (Addendum)
Advanced Heart Failure Rounding Note  PCP-Cardiologist: No primary care provider on file.   Subjective:    Yesterday diuresed with IV lasix. Negative 1.3 liters.   Remains in  A fib. Was off amio this morning?   Taken off heparin drip due to subtherapeutic heparin level. AT level 57%. Switched to lovenox.   Remains SOB with minimal exertion.    Objective:   Weight Range: 115.3 kg Body mass index is 35.45 kg/m.   Vital Signs:   Temp:  [97.9 F (36.6 C)-98.7 F (37.1 C)] 98.5 F (36.9 C) (09/10 0844) Pulse Rate:  [66-87] 84 (09/10 0844) Resp:  [15-22] 20 (09/10 0844) BP: (115-130)/(67-102) 115/67 (09/10 0844) SpO2:  [100 %] 100 % (09/10 0844) Weight:  [114.1 kg-115.3 kg] 115.3 kg (09/10 0319) Last BM Date: 03/14/20  Weight change: Filed Weights   03/16/20 0438 03/16/20 1500 03/17/20 0319  Weight: 114.2 kg 114.1 kg 115.3 kg    Intake/Output:   Intake/Output Summary (Last 24 hours) at 03/17/2020 0852 Last data filed at 03/17/2020 0846 Gross per 24 hour  Intake 895.74 ml  Output 3550 ml  Net -2654.26 ml      Physical Exam    General:   No resp difficulty HEENT: Normal Neck: Supple. JVP to jaw . Carotids 2+ bilat; no bruits. No lymphadenopathy or thyromegaly appreciated. Cor: PMI nondisplaced. Irregular rate & rhythm. No rubs, gallops or murmurs. Lungs: EX wheeze through out.  Abdomen: Soft, nontender, nondistended. No hepatosplenomegaly. No bruits or masses. Good bowel sounds. Extremities: No cyanosis, clubbing, rash, R and LLE 3+ edema extending to thighs. RUE edema.  uro: Alert & orientedx3, cranial nerves grossly intact. moves all 4 extremities w/o difficulty. Affect pleasant   Telemetry   A fib 70-80s   EKG   pending.   Labs    CBC Recent Labs    03/14/20 0920 03/15/20 0519 03/16/20 0556 03/17/20 0630  WBC 8.8   < > 4.7 3.9*  NEUTROABS 7.8*  --   --   --   HGB 8.8*   < > 8.2* 7.8*  HCT 30.2*   < > 26.9* 25.7*  MCV 101.3*   < > 98.5  98.5  PLT 178   < > 162 152   < > = values in this interval not displayed.   Basic Metabolic Panel Recent Labs    03/14/20 1529 03/15/20 0519 03/16/20 0652 03/17/20 0630  NA  --    < > 140 140  K  --    < > 3.6 3.3*  CL  --    < > 110 109  CO2  --    < > 19* 20*  GLUCOSE  --    < > 149* 100*  BUN  --    < > 30* 32*  CREATININE  --    < > 2.35* 2.52*  CALCIUM  --    < > 7.6* 7.4*  MG 1.8  --   --   --   PHOS 3.6  --   --  4.0   < > = values in this interval not displayed.   Liver Function Tests Recent Labs    03/15/20 0519 03/15/20 0519 03/16/20 0652 03/17/20 0630  AST 17  --  16  --   ALT 15  --  14  --   ALKPHOS 1,039*  --  1,000*  --   BILITOT 0.7  --  0.4  --   PROT 5.1*  --  5.1*  --   ALBUMIN 2.8*   < > 2.7* 2.6*   < > = values in this interval not displayed.   No results for input(s): LIPASE, AMYLASE in the last 72 hours. Cardiac Enzymes No results for input(s): CKTOTAL, CKMB, CKMBINDEX, TROPONINI in the last 72 hours.  BNP: BNP (last 3 results) Recent Labs    03/14/20 0920  BNP >4,500.0*    ProBNP (last 3 results) No results for input(s): PROBNP in the last 8760 hours.   D-Dimer No results for input(s): DDIMER in the last 72 hours. Hemoglobin A1C Recent Labs    03/14/20 2215  HGBA1C 5.0   Fasting Lipid Panel Recent Labs    03/14/20 2215  CHOL 111  HDL 43  LDLCALC 55  TRIG 66  CHOLHDL 2.6   Thyroid Function Tests Recent Labs    03/16/20 1525  TSH 1.356    Other results:   Imaging    DG Chest 2 View  Result Date: 03/16/2020 CLINICAL DATA:  Tachycardia EXAM: CHEST - 2 VIEW COMPARISON:  03/14/2020 FINDINGS: Cardiac shadow is enlarged but stable. The lungs are well aerated bilaterally. No focal infiltrate is seen. Improvement in the vascular congestion seen on the prior exam is noted. No bony abnormality is seen. IMPRESSION: Improved vascular congestion.  No focal infiltrate is seen. Electronically Signed   By: Inez [name] M.D.    On: 03/16/2020 14:55   ECHOCARDIOGRAM COMPLETE  Result Date: 03/16/2020    ECHOCARDIOGRAM REPORT   Patient Name:   [name] Date of Exam: 03/16/2020 Medical Rec #:  [id number]       Height:       71.0 in Accession #:    [id number]      Weight:       251.8 lb Date of Birth:  [birth year]       BSA:          2.325 m Patient Age:    54 years        BP:           137/105 mmHg Patient Gender: F               HR:           119 bpm. Exam Location:  Inpatient Procedure: 2D Echo, Cardiac Doppler and Color Doppler Indications:    Abnormal ECG  History:        Patient has prior history of Echocardiogram examinations, most                 recent 02/23/2007. Arrythmias:Atrial Fibrillation; Risk                 Factors:Hypertension, Diabetes, Current Smoker and Dyslipidemia.                 GERD.  Sonographer:    Clayton Lefort RDCS (AE) Referring Phys: [place]  1. Left ventricular ejection fraction, by estimation, is 25 to 30%. The left ventricle has severely decreased function. The left ventricle demonstrates global hypokinesis. The left ventricular internal cavity size was severely dilated. There is severe concentric left ventricular hypertrophy. Left ventricular diastolic function could not be evaluated.  2. Right ventricular systolic function is mildly reduced. The right ventricular size is mildly enlarged. There is mildly elevated pulmonary artery systolic pressure.  3. Left atrial size was severely dilated.  4. Right atrial size was moderately dilated.  5. A small pericardial effusion is present.  6. The  mitral valve is abnormal. Mild mitral valve regurgitation. Moderate to severe mitral annular calcification.  7. Tricuspid valve regurgitation is mild to moderate.  8. The aortic valve is tricuspid. There is moderate calcification of the aortic valve. There is moderate thickening of the aortic valve. Aortic valve regurgitation is moderate.  9. Pulmonic valve regurgitation is moderate. 10.  Aortic dilatation noted. There is borderline dilatation of the ascending aorta, measuring 38 mm. 11. The inferior vena cava is dilated in size with <50% respiratory variability, suggesting right atrial pressure of 15 mmHg. Comparison(s): Prior images unable to be directly viewed, comparison made by report only. Conclusion(s)/Recommendation(s): Reduced EF with global hypokinesis, in addition to near akinesis of inferior/inferoseptal wall (noted previously). Small pericardial effusion, Very dilated IVC. No clear RA/RV diastolic collapse and no significant respiratory inflow variability to suggest echo evidence of tamponade. however, newly reduced function and elevated right atrial pressure concerning. FINDINGS  Left Ventricle: Left ventricular ejection fraction, by estimation, is 25 to 30%. The left ventricle has severely decreased function. The left ventricle demonstrates global hypokinesis. The left ventricular internal cavity size was severely dilated. There is severe concentric left ventricular hypertrophy. Left ventricular diastolic function could not be evaluated due to atrial fibrillation. Left ventricular diastolic function could not be evaluated. Right Ventricle: The right ventricular size is mildly enlarged. Right vetricular wall thickness was not assessed. Right ventricular systolic function is mildly reduced. There is mildly elevated pulmonary artery systolic pressure. The tricuspid regurgitant velocity is 2.64 m/s, and with an assumed right atrial pressure of 15 mmHg, the estimated right ventricular systolic pressure is 02.7 mmHg. Left Atrium: Left atrial size was severely dilated. Right Atrium: Right atrial size was moderately dilated. Pericardium: A small pericardial effusion is present. Mitral Valve: The mitral valve is abnormal. There is moderate thickening of the mitral valve leaflet(s). There is moderate calcification of the mitral valve leaflet(s). Moderate to severe mitral annular calcification.  Mild mitral valve regurgitation. Tricuspid Valve: The tricuspid valve is grossly normal. Tricuspid valve regurgitation is mild to moderate. Aortic Valve: The aortic valve is tricuspid. There is moderate calcification of the aortic valve. There is moderate thickening of the aortic valve. Aortic valve regurgitation is moderate. Aortic regurgitation PHT measures 389 msec. Aortic valve mean gradient measures 5.3 mmHg. Aortic valve peak gradient measures 10.3 mmHg. Aortic valve area, by VTI measures 3.65 cm. Pulmonic Valve: The pulmonic valve was not well visualized. Pulmonic valve regurgitation is moderate. No evidence of pulmonic stenosis. Aorta: Aortic dilatation noted. There is borderline dilatation of the ascending aorta, measuring 38 mm. Venous: The inferior vena cava is dilated in size with less than 50% respiratory variability, suggesting right atrial pressure of 15 mmHg. IAS/Shunts: The atrial septum is grossly normal.  LEFT VENTRICLE PLAX 2D LVIDd:         6.30 cm LVIDs:         5.60 cm LV PW:         1.60 cm LV IVS:        2.10 cm LVOT diam:     2.50 cm LV SV:         87 LV SV Index:   38 LVOT Area:     4.91 cm  LV Volumes (MOD) LV vol d, MOD A2C: 168.0 ml LV vol d, MOD A4C: 158.0 ml LV vol s, MOD A2C: 124.0 ml LV vol s, MOD A4C: 94.8 ml LV SV MOD A2C:     44.0 ml LV SV MOD A4C:  158.0 ml LV SV MOD BP:      56.4 ml RIGHT VENTRICLE            IVC RV Basal diam:  3.80 cm    IVC diam: 3.80 cm RV Mid diam:    3.20 cm RV S prime:     5.77 cm/s TAPSE (M-mode): 1.6 cm LEFT ATRIUM              Index       RIGHT ATRIUM           Index LA diam:        5.00 cm  2.15 cm/m  RA Area:     22.80 cm LA Vol (A2C):   74.9 ml  32.21 ml/m RA Volume:   68.90 ml  29.63 ml/m LA Vol (A4C):   106.0 ml 45.59 ml/m LA Biplane Vol: 91.0 ml  39.14 ml/m  AORTIC VALVE AV Area (Vmax):    3.39 cm AV Area (Vmean):   3.42 cm AV Area (VTI):     3.65 cm AV Vmax:           160.33 cm/s AV Vmean:          104.267 cm/s AV VTI:             0.239 m AV Peak Grad:      10.3 mmHg AV Mean Grad:      5.3 mmHg LVOT Vmax:         110.67 cm/s LVOT Vmean:        72.600 cm/s LVOT VTI:          0.178 m LVOT/AV VTI ratio: 0.74 AI PHT:            389 msec  AORTA Ao Root diam: 3.40 cm Ao Asc diam:  3.80 cm TRICUSPID VALVE TR Peak grad:   27.9 mmHg TR Vmax:        264.00 cm/s  SHUNTS Systemic VTI:  0.18 m Systemic Diam: 2.50 cm Buford Dresser MD Electronically signed by Buford Dresser MD Signature Date/Time: 03/16/2020/9:39:02 AM    Final    VAS [place] LOWER EXTREMITY VENOUS (DVT)  Result Date: 03/16/2020  Lower Venous DVTStudy Indications: Pitting edema, right groin pain, upper extremity DVT.  Limitations: Body habitus and poor ultrasound/tissue interface. Comparison Study: No prior study Performing Technologist: Maudry Mayhew MHA, RDMS, RVT, RDCS  Examination Guidelines: A complete evaluation includes B-mode imaging, spectral Doppler, color Doppler, and power Doppler as needed of all accessible portions of each vessel. Bilateral testing is considered an integral part of a complete examination. Limited examinations for reoccurring indications may be performed as noted. The reflux portion of the exam is performed with the patient in reverse Trendelenburg.  +---------+---------------+---------+-----------+----------+--------------+ RIGHT    CompressibilityPhasicitySpontaneityPropertiesThrombus Aging +---------+---------------+---------+-----------+----------+--------------+ CFV      Full           No       Yes        Patent                   +---------+---------------+---------+-----------+----------+--------------+ SFJ      Full                                                        +---------+---------------+---------+-----------+----------+--------------+ FV Prox  Yes                                 +---------+---------------+---------+-----------+----------+--------------+ FV Mid                            Yes                                 +---------+---------------+---------+-----------+----------+--------------+ FV Distal                        Yes                                 +---------+---------------+---------+-----------+----------+--------------+ PFV                              Yes                                 +---------+---------------+---------+-----------+----------+--------------+ POP      Full           Yes      Yes                                 +---------+---------------+---------+-----------+----------+--------------+ PTV      Full                    Yes                                 +---------+---------------+---------+-----------+----------+--------------+ PERO     Full                    Yes                                 +---------+---------------+---------+-----------+----------+--------------+   +---------+---------------+---------+-----------+----------+--------------+ LEFT     CompressibilityPhasicitySpontaneityPropertiesThrombus Aging +---------+---------------+---------+-----------+----------+--------------+ CFV      Full           No       Yes                                 +---------+---------------+---------+-----------+----------+--------------+ FV Prox                          Yes                                 +---------+---------------+---------+-----------+----------+--------------+ FV Mid                           Yes                                 +---------+---------------+---------+-----------+----------+--------------+ FV Distal  Yes                                 +---------+---------------+---------+-----------+----------+--------------+ POP      Full           No       Yes                                 +---------+---------------+---------+-----------+----------+--------------+ PTV      Full                    Yes                                  +---------+---------------+---------+-----------+----------+--------------+ PERO     Full                    Yes                                 +---------+---------------+---------+-----------+----------+--------------+   Left Technical Findings: Not visualized segments include SFJ, PFV,.   Summary: RIGHT: - There is no evidence of deep vein thrombosis in the lower extremity. However, portions of this examination were limited- see technologist comments above. - No cystic structure found in the popliteal fossa.  LEFT: - There is no evidence of deep vein thrombosis in the lower extremity. However, portions of this examination were limited- see technologist comments above. - No cystic structure found in the popliteal fossa.  Unable to perform all compression maneuvers secondary to significant pitting edema.  Pulsatile venous flow is suggestive of possbily elevated right heart pressure. *See table(s) above for measurements and observations. Electronically signed by Servando Snare MD on 03/16/2020 at 2:04:16 PM.    Final       Medications:     Scheduled Medications: . albuterol  2.5 mg Inhalation Q6H  . atorvastatin  10 mg Oral Once per day on Mon Wed Fri  . calcitRIOL  0.5 mcg Oral Daily  . calcium-vitamin D  1 tablet Oral Q breakfast  . enoxaparin (LOVENOX) injection  120 mg Subcutaneous Q24H  . ferrous sulfate  325 mg Oral BID WC  . furosemide  80 mg Intravenous BID  . insulin aspart  0-20 Units Subcutaneous TID WC  . mycophenolate  540 mg Oral BID  . pantoprazole  40 mg Oral Daily  . predniSONE  2.5 mg Oral Q breakfast  . sodium chloride flush  3 mL Intravenous Q12H  . tacrolimus  3 mg Oral BID     Infusions: . azithromycin Stopped (03/16/20 1641)  . cefTRIAXone (ROCEPHIN)  IV Stopped (03/16/20 1802)     PRN Medications:  acetaminophen **OR** acetaminophen, guaiFENesin-dextromethorphan    Patient Profile   54 y/o AAF w/ h/o HTN, T2DM, tobacco abuse, CAD (extensive CAD on  prior chest CT) and CKD s/p rt renal transplant in 2011 at [facility]. She is followed by [facility]. She has chronic LBBB but no other prior cardiac history.  She has a +FH of CHF (sister 22).   Assessment/Plan   1. Acute Systolic Heart Failure: - NYHA Class IIIb w/ marked fluid overload. BNP >4,500  - New diagnosis. EF 25-30 w/ diffuse hypokinesis. RV ok. Severe concentric LVH. No prior studies for  comparison.  - suspect hypertensive cardiomyopathy 2/2 long standing HTN - also ? ICM. Chest CT 4/21 shows multivessel CAD w/ heavily calcified LAD. Plan noninvasive ischemic w/u  Myoview. Avoid cath w/ CKD.  - ? Cardiac Amyloid. Protein Electrophoresis - no M Spike.   - ? Tachy-Mediated.  -Marked volume overload. Continue 80 mg IV lasix twice daily. May need to add milrinone assist with diuresis.  - Considered PICC but with elevated creatinine will hold off. Will need RHC to further assess hemodynamic.  - no ARB/ARNi, spiro nor dig w/ CKD - consider low dose Bidil after IV diuresis, if BP permits - no ? blocker yet w/ acute decompensation - consider future SGLT2i w/ concomitant CKD and DM, pending GFR (will avoid for now given GFR <25)     2. Acute on Chronic Kidney Disease: - Nephrology following.  - s/p rt renal transplant in 2011 at [facility].  - on prednisone, Myfortic and tacrolimus  - baseline SCr 1.7---> 2.5 today.  - suspect AKI 2/2 intravascular volume depletion from 3rd spacing. ? Component of cardiorenal syndrome - 3. Atrial Fibrillation:  -new. Went into rapid AF 140s on 9/8 - converted to NSR on amio gtt, now back in AF with controlled rate.  - Continue amio drip at 30 mg per hour.  -  Heparin stopped and switched to lovenox.    4. Presumed CAP: - admit CXR suggestive of CHF w/ interstial edema as well as findings concerning for superimposed infection. PCT 2.65. Lactic acid 1.9. AF. Blood cultures negative - continue abx therapy w/ ceftriaxone + azithromycin  5. CAD: - prior  chest CT 4/21 reviewed and showed multivessel CAD w/ heavily calcified LAD - not a candidate for LHC currently w/ abnormal renal function  - will plan nuclear study to assess for coronary ischemia prior to d/c  - continue statin, increased atorvastatin to 40 mg night to target LDL < 70  - no ASA w/ chronic anemia and need for anticoagulation for DVT - hold ? blocker initiation while in acute decompensated HF  - smoking cessation imperative   6. RUE DVT: - no recent trauma. Concern for malignancy.  - V/Q scan pending  - lower extremity doppler negative.  - Off heparin drip and on lovenox per primary.   7. Anemia of Chronic Disease/ Iron Deficiency Anemia: - 2/2 CKD - Hgb 8.2 c/w baseline - Fe low. Sats Ratio 9% - Allergic to IV iron - On ferrous sulfate 325 BID   8. HTN: - currently well controlled   9. T2DM: - well controlled, Hgb A1c 5.0  - GFR borderline for SGLT2i   10. Tobacco Abuse:  - smokes 1/2 ppm  - smoking cessation advised   11. HLD - known CAD based on Chest CT - recent outpatient LP 6/21 showed elevated LDL at 84 mg/dL - increase home atorvastatin to 40 mg nightly - needs repeat FLP and HFTs in 6-8 weeks   12. Elevated Alk Phos/ Abnormal Bone Scans  - Alk Phos 1,300 on admit  - Had whole body bone scan 3/21 w/ diffuse update throughout the axial and appendicular skeleton suspicious for metastatic disease - undergoing w/u for Multiple Myeloma  - SPEP negative.   Length of Stay: 3  Amy Clegg, NP  03/17/2020, 8:52 AM  Advanced Heart Failure Team Pager [phone] (M-F; 7a - 4p)  Please contact [facility] for night-coverage after hours (4p -7a ) and weekends on [email or web address]  Patient seen with NP, agree with the  above note.   Creatinine up 2.35 => 2.5 today.  She is now diuresing well.  Still with cough and dyspnea.  Amiodarone gtt restarted this morning, she is now back in NSR.   General: NAD Neck: JVP 14 cm, no thyromegaly or thyroid nodule.    Lungs: Rhonchi bilaterally.  CV: Nondisplaced PMI.  Heart regular S1/S2, no S3/S4,2/6 HSM LLSB.  1+ edema to knees.   Abdomen: Soft, nontender, no hepatosplenomegaly, no distention.  Skin: Intact without lesions or rashes.  Neurologic: Alert and oriented x 3.  Psych: Normal affect. Extremities: No clubbing or cyanosis.  HEENT: Normal.   Patient is in NSR on amiodarone gtt.  Continue therapeutic Lovenox.   Acute systolic CHF, ?hypertensive cardiomyopathy versus ischemic CMP.  Still volume overloaded on exam though creatinine higher at 2.5.  - Continue Lasix 80 mg IV bid for now, diuresing well.  - Unna boots. - Can add Bidil 1/2 tab tid, SBP 130s.   AKI in CKD stage 3, h/o renal transplant.  Marked volume overload.  - Continue diuresis as above.  - mycophenolate and tacrolimus.  - Nephrology following.   Coronary calcium on CT chest.  No chest pain.  - Statin.  - Eventual functional study to assess for ischemia given elevated creatinine, no ACS.   Loralie Champagne 03/17/2020 4:49 PM

## 2020-03-17 NOTE — Consult Note (Signed)
   [facility] Inpatient Consult   03/17/2020  [name] 1966-02-23 [id number]   [facility] [[facility]] Patient: Medicare   Patient was screened for [place] Management services. Patient will have the transition of care call conducted by the primary care provider at [facility] health Internal Medicine. This an [employer] which has a chronic disease management Embedded Care Management team. Primary Care Provider is Molli Hazard A. DO is listed as the provider.    Plan: Following for progress and disposition and update the [facility] team for needs.  Please contact for further questions,  Natividad Brood, RN BSN [facility] Liaison  [phone] business mobile phone Toll free office [phone]  Fax number: [phone] [email or web address]

## 2020-03-17 NOTE — Progress Notes (Signed)
[facility] Progress Note    Assessment/ Plan:   Assessment/Plan: 1.  Acute kidney injury in patient with chronic kidney disease stage III T: Baseline Cr is 1.7, up to 2.3.  Suspect the current acute kidney injury may be hemodynamically mediated in the setting of decreased effective arterial blood volume/interstitial fluid and ongoing diuresis possibly cardiorenal syndrome as well.  Continue pred 2.5 mg daily, Myfortic 540 mg BID, tacrolimus 3 mg BID (confirmed by pharmacy).  Get level tomorrow.     Avoid nephrotoxic medications including NSAIDs and iodinated intravenous contrast exposure unless the latter is absolutely indicated.  Preferred narcotic agents for pain control are hydromorphone, fentanyl, and methadone. Morphine should not be used. Avoid Baclofen and avoid oral sodium phosphate and magnesium citrate based laxatives / bowel preps. Continue strict Input and Output monitoring. Will monitor the patient closely with you and intervene or adjust therapy as indicated by changes in clinical status/labs .  2.  Acute hypoxic respiratory failure/volume overload/acute exacerbation of congestive heart failure:  Improved on IV Lasix.  On empiric azithro/ CTX.   3.  Anemia of chronic kidney disease: Significantly low hemoglobin and hematocrit noted at today's labs, iron studies with 9% tsat, allergic to IV iron.  Will give PO BID  4.  Hypertension: Elevated blood pressure noted at this time, monitor with diuresis.  5.  Type 2 diabetes mellitus: Resume management with sliding scale insulin per primary service.  6.  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  Evaluation for plasma cell dyscrasia undertaken by primary service (SPEP negative) but other malignancy may be playing a role.   7.  RLE swelling- venous doppler negative  8.  New onset heart failURE: TTE EF 25-30%.  Heart failure following  now, considering milrinone to augment diuresis.   9.  RUE DVT: hep gtt--> lovenox   Subjective:    Continues to improve, Cr up just a little bit, robust UOP.     Objective:   BP 115/67 (BP Location: Left Arm)   Pulse 84   Temp 98.5 F (36.9 C) (Oral)   Resp 20   Ht 5\' 11"  (1.803 m)   Wt 115.3 kg   LMP 11/05/2012   SpO2 100%   BMI 35.45 kg/m   Intake/Output Summary (Last 24 hours) at 03/17/2020 1127 Last data filed at 03/17/2020 0846 Gross per 24 hour  Intake 895.74 ml  Output 3550 ml  Net -2654.26 ml   Weight change: -0.1 kg  Physical Exam: Gen: sitting up in bed CVS: RRR  Resp: breathing better- still some bibasilar crackles Abd: nondistended Ext: 2+ edema bilaterally LEs R >L, R arm swelling, no L arm swelling  Imaging: DG Chest 2 View  Result Date: 03/16/2020 CLINICAL DATA:  Tachycardia EXAM: CHEST - 2 VIEW COMPARISON:  03/14/2020 FINDINGS: Cardiac shadow is enlarged but stable. The lungs are well aerated bilaterally. No focal infiltrate is seen. Improvement in the vascular congestion seen on the prior exam is noted. No bony abnormality is seen. IMPRESSION: Improved vascular congestion.  No focal infiltrate is seen. Electronically Signed   By: Inez [name] M.D.   On: 03/16/2020 14:55   ECHOCARDIOGRAM COMPLETE  Result Date: 03/16/2020    ECHOCARDIOGRAM REPORT   Patient Name:   [name] Date of Exam: 03/16/2020 Medical Rec #:  [id number]       Height:       71.0 in Accession #:  [id number]      Weight:       251.8 lb Date of Birth:  03-18-1966       BSA:          2.325 m Patient Age:    2 years        BP:           137/105 mmHg Patient Gender: F               HR:           119 bpm. Exam Location:  Inpatient Procedure: 2D Echo, Cardiac Doppler and Color Doppler Indications:    Abnormal ECG  History:        Patient has prior history of Echocardiogram examinations, most                 recent 02/23/2007. Arrythmias:Atrial Fibrillation; Risk                  Factors:Hypertension, Diabetes, Current Smoker and Dyslipidemia.                 GERD.  Sonographer:    Clayton Lefort RDCS (AE) Referring Phys: [place]  1. Left ventricular ejection fraction, by estimation, is 25 to 30%. The left ventricle has severely decreased function. The left ventricle demonstrates global hypokinesis. The left ventricular internal cavity size was severely dilated. There is severe concentric left ventricular hypertrophy. Left ventricular diastolic function could not be evaluated.  2. Right ventricular systolic function is mildly reduced. The right ventricular size is mildly enlarged. There is mildly elevated pulmonary artery systolic pressure.  3. Left atrial size was severely dilated.  4. Right atrial size was moderately dilated.  5. A small pericardial effusion is present.  6. The mitral valve is abnormal. Mild mitral valve regurgitation. Moderate to severe mitral annular calcification.  7. Tricuspid valve regurgitation is mild to moderate.  8. The aortic valve is tricuspid. There is moderate calcification of the aortic valve. There is moderate thickening of the aortic valve. Aortic valve regurgitation is moderate.  9. Pulmonic valve regurgitation is moderate. 10. Aortic dilatation noted. There is borderline dilatation of the ascending aorta, measuring 38 mm. 11. The inferior vena cava is dilated in size with <50% respiratory variability, suggesting right atrial pressure of 15 mmHg. Comparison(s): Prior images unable to be directly viewed, comparison made by report only. Conclusion(s)/Recommendation(s): Reduced EF with global hypokinesis, in addition to near akinesis of inferior/inferoseptal wall (noted previously). Small pericardial effusion, Very dilated IVC. No clear RA/RV diastolic collapse and no significant respiratory inflow variability to suggest echo evidence of tamponade. however, newly reduced function and elevated right atrial pressure concerning. FINDINGS   Left Ventricle: Left ventricular ejection fraction, by estimation, is 25 to 30%. The left ventricle has severely decreased function. The left ventricle demonstrates global hypokinesis. The left ventricular internal cavity size was severely dilated. There is severe concentric left ventricular hypertrophy. Left ventricular diastolic function could not be evaluated due to atrial fibrillation. Left ventricular diastolic function could not be evaluated. Right Ventricle: The right ventricular size is mildly enlarged. Right vetricular wall thickness was not assessed. Right ventricular systolic function is mildly reduced. There is mildly elevated pulmonary artery systolic pressure. The tricuspid regurgitant velocity is 2.64 m/s, and with an assumed right atrial pressure of 15 mmHg, the estimated right ventricular systolic pressure is 25.9 mmHg. Left Atrium: Left atrial size was severely dilated. Right Atrium: Right atrial size was moderately dilated. Pericardium:  A small pericardial effusion is present. Mitral Valve: The mitral valve is abnormal. There is moderate thickening of the mitral valve leaflet(s). There is moderate calcification of the mitral valve leaflet(s). Moderate to severe mitral annular calcification. Mild mitral valve regurgitation. Tricuspid Valve: The tricuspid valve is grossly normal. Tricuspid valve regurgitation is mild to moderate. Aortic Valve: The aortic valve is tricuspid. There is moderate calcification of the aortic valve. There is moderate thickening of the aortic valve. Aortic valve regurgitation is moderate. Aortic regurgitation PHT measures 389 msec. Aortic valve mean gradient measures 5.3 mmHg. Aortic valve peak gradient measures 10.3 mmHg. Aortic valve area, by VTI measures 3.65 cm. Pulmonic Valve: The pulmonic valve was not well visualized. Pulmonic valve regurgitation is moderate. No evidence of pulmonic stenosis. Aorta: Aortic dilatation noted. There is borderline dilatation of the  ascending aorta, measuring 38 mm. Venous: The inferior vena cava is dilated in size with less than 50% respiratory variability, suggesting right atrial pressure of 15 mmHg. IAS/Shunts: The atrial septum is grossly normal.  LEFT VENTRICLE PLAX 2D LVIDd:         6.30 cm LVIDs:         5.60 cm LV PW:         1.60 cm LV IVS:        2.10 cm LVOT diam:     2.50 cm LV SV:         87 LV SV Index:   38 LVOT Area:     4.91 cm  LV Volumes (MOD) LV vol d, MOD A2C: 168.0 ml LV vol d, MOD A4C: 158.0 ml LV vol s, MOD A2C: 124.0 ml LV vol s, MOD A4C: 94.8 ml LV SV MOD A2C:     44.0 ml LV SV MOD A4C:     158.0 ml LV SV MOD BP:      56.4 ml RIGHT VENTRICLE            IVC RV Basal diam:  3.80 cm    IVC diam: 3.80 cm RV Mid diam:    3.20 cm RV S prime:     5.77 cm/s TAPSE (M-mode): 1.6 cm LEFT ATRIUM              Index       RIGHT ATRIUM           Index LA diam:        5.00 cm  2.15 cm/m  RA Area:     22.80 cm LA Vol (A2C):   74.9 ml  32.21 ml/m RA Volume:   68.90 ml  29.63 ml/m LA Vol (A4C):   106.0 ml 45.59 ml/m LA Biplane Vol: 91.0 ml  39.14 ml/m  AORTIC VALVE AV Area (Vmax):    3.39 cm AV Area (Vmean):   3.42 cm AV Area (VTI):     3.65 cm AV Vmax:           160.33 cm/s AV Vmean:          104.267 cm/s AV VTI:            0.239 m AV Peak Grad:      10.3 mmHg AV Mean Grad:      5.3 mmHg LVOT Vmax:         110.67 cm/s LVOT Vmean:        72.600 cm/s LVOT VTI:          0.178 m LVOT/AV VTI ratio: 0.74 AI PHT:  389 msec  AORTA Ao Root diam: 3.40 cm Ao Asc diam:  3.80 cm TRICUSPID VALVE TR Peak grad:   27.9 mmHg TR Vmax:        264.00 cm/s  SHUNTS Systemic VTI:  0.18 m Systemic Diam: 2.50 cm Buford Dresser MD Electronically signed by Buford Dresser MD Signature Date/Time: 03/16/2020/9:39:02 AM    Final    VAS [place] LOWER EXTREMITY VENOUS (DVT)  Result Date: 03/16/2020  Lower Venous DVTStudy Indications: Pitting edema, right groin pain, upper extremity DVT.  Limitations: Body habitus and poor ultrasound/tissue  interface. Comparison Study: No prior study Performing Technologist: Maudry Mayhew MHA, RDMS, RVT, RDCS  Examination Guidelines: A complete evaluation includes B-mode imaging, spectral Doppler, color Doppler, and power Doppler as needed of all accessible portions of each vessel. Bilateral testing is considered an integral part of a complete examination. Limited examinations for reoccurring indications may be performed as noted. The reflux portion of the exam is performed with the patient in reverse Trendelenburg.  +---------+---------------+---------+-----------+----------+--------------+ RIGHT    CompressibilityPhasicitySpontaneityPropertiesThrombus Aging +---------+---------------+---------+-----------+----------+--------------+ CFV      Full           No       Yes        Patent                   +---------+---------------+---------+-----------+----------+--------------+ SFJ      Full                                                        +---------+---------------+---------+-----------+----------+--------------+ FV Prox                          Yes                                 +---------+---------------+---------+-----------+----------+--------------+ FV Mid                           Yes                                 +---------+---------------+---------+-----------+----------+--------------+ FV Distal                        Yes                                 +---------+---------------+---------+-----------+----------+--------------+ PFV                              Yes                                 +---------+---------------+---------+-----------+----------+--------------+ POP      Full           Yes      Yes                                 +---------+---------------+---------+-----------+----------+--------------+ PTV  Full                    Yes                                  +---------+---------------+---------+-----------+----------+--------------+ PERO     Full                    Yes                                 +---------+---------------+---------+-----------+----------+--------------+   +---------+---------------+---------+-----------+----------+--------------+ LEFT     CompressibilityPhasicitySpontaneityPropertiesThrombus Aging +---------+---------------+---------+-----------+----------+--------------+ CFV      Full           No       Yes                                 +---------+---------------+---------+-----------+----------+--------------+ FV Prox                          Yes                                 +---------+---------------+---------+-----------+----------+--------------+ FV Mid                           Yes                                 +---------+---------------+---------+-----------+----------+--------------+ FV Distal                        Yes                                 +---------+---------------+---------+-----------+----------+--------------+ POP      Full           No       Yes                                 +---------+---------------+---------+-----------+----------+--------------+ PTV      Full                    Yes                                 +---------+---------------+---------+-----------+----------+--------------+ PERO     Full                    Yes                                 +---------+---------------+---------+-----------+----------+--------------+   Left Technical Findings: Not visualized segments include SFJ, PFV,.   Summary: RIGHT: - There is no evidence of deep vein thrombosis in the lower extremity. However, portions of this examination were limited- see technologist comments above. - No cystic structure found in the popliteal fossa.  LEFT: - There is no evidence of deep vein  thrombosis in the lower extremity. However, portions of this examination were limited-  see technologist comments above. - No cystic structure found in the popliteal fossa.  Unable to perform all compression maneuvers secondary to significant pitting edema.  Pulsatile venous flow is suggestive of possbily elevated right heart pressure. *See table(s) above for measurements and observations. Electronically signed by Servando Snare MD on 03/16/2020 at 2:04:16 PM.    Final     Labs: BMET Recent Labs  Lab 03/14/20 0920 03/14/20 1529 03/15/20 0519 03/16/20 0652 03/17/20 0630  NA 141  --  141 140 140  K 4.1  --  3.8 3.6 3.3*  CL 113*  --  112* 110 109  CO2 17*  --  21* 19* 20*  GLUCOSE 145*  --  113* 149* 100*  BUN 26*  --  26* 30* 32*  CREATININE 2.23*  --  2.37* 2.35* 2.52*  CALCIUM 8.1*  --  8.0* 7.6* 7.4*  PHOS  --  3.6  --   --  4.0   CBC Recent Labs  Lab 03/14/20 0920 03/15/20 0519 03/16/20 0556 03/17/20 0630  WBC 8.8 4.9 4.7 3.9*  NEUTROABS 7.8*  --   --   --   HGB 8.8* 8.0* 8.2* 7.8*  HCT 30.2* 26.7* 26.9* 25.7*  MCV 101.3* 98.5 98.5 98.5  PLT 178 140* 162 152    Medications:    . albuterol  2.5 mg Inhalation Q6H  . atorvastatin  10 mg Oral Once per day on Mon Wed Fri  . calcitRIOL  0.5 mcg Oral Daily  . calcium-vitamin D  1 tablet Oral Q breakfast  . enoxaparin (LOVENOX) injection  120 mg Subcutaneous Q12H  . ferrous sulfate  325 mg Oral BID WC  . furosemide  80 mg Intravenous BID  . insulin aspart  0-20 Units Subcutaneous TID WC  . mycophenolate  540 mg Oral BID  . pantoprazole  40 mg Oral Daily  . predniSONE  2.5 mg Oral Q breakfast  . sodium chloride flush  3 mL Intravenous Q12H  . tacrolimus  3 mg Oral BID  . vitamin B-12  250 mcg Oral Daily     Madelon Lips MD 03/17/2020, 11:27 AM

## 2020-03-17 NOTE — Progress Notes (Signed)
Orthopedic Tech Progress Note Patient Details:  [name] 02/06/1966 [id number]  Ortho Devices Type of Ortho Device: Louretta Parma boot Ortho Device/Splint Location: BLE Ortho Device/Splint Interventions: Ordered, Application   Post Interventions Patient Tolerated: Well Instructions Provided: Care of device   Janit Pagan 03/17/2020, 3:05 PM

## 2020-03-18 LAB — CBC
HCT: 25.5 % — ABNORMAL LOW (ref 36.0–46.0)
HCT: 24.8 % — ABNORMAL LOW (ref 36.0–46.0)
Hemoglobin: 7.7 g/dL — ABNORMAL LOW (ref 12.0–15.0)
Hemoglobin: 7.6 g/dL — ABNORMAL LOW (ref 12.0–15.0)
MCH: 29.6 pg (ref 26.0–34.0)
MCH: 29.3 pg (ref 26.0–34.0)
MCHC: 30.2 g/dL (ref 30.0–36.0)
MCHC: 30.6 g/dL (ref 30.0–36.0)
MCV: 98.1 fL (ref 80.0–100.0)
MCV: 95.8 fL (ref 80.0–100.0)
Platelets: 177 10*3/uL (ref 150–400)
Platelets: 161 10*3/uL (ref 150–400)
RBC: 2.6 MIL/uL — ABNORMAL LOW (ref 3.87–5.11)
RBC: 2.59 MIL/uL — ABNORMAL LOW (ref 3.87–5.11)
RDW: 15.3 % (ref 11.5–15.5)
RDW: 15.2 % (ref 11.5–15.5)
WBC: 4.2 10*3/uL (ref 4.0–10.5)
WBC: 4.8 10*3/uL (ref 4.0–10.5)
nRBC: 0 % (ref 0.0–0.2)
nRBC: 0 % (ref 0.0–0.2)

## 2020-03-18 LAB — RENAL FUNCTION PANEL
Albumin: 2.7 g/dL — ABNORMAL LOW (ref 3.5–5.0)
Anion gap: 11 (ref 5–15)
BUN: 33 mg/dL — ABNORMAL HIGH (ref 6–20)
CO2: 20 mmol/L — ABNORMAL LOW (ref 22–32)
Calcium: 7.4 mg/dL — ABNORMAL LOW (ref 8.9–10.3)
Chloride: 110 mmol/L (ref 98–111)
Creatinine, Ser: 2.46 mg/dL — ABNORMAL HIGH (ref 0.44–1.00)
GFR calc Af Amer: 25 mL/min — ABNORMAL LOW (ref >60)
GFR calc non Af Amer: 21 mL/min — ABNORMAL LOW (ref >60)
Glucose, Bld: 102 mg/dL — ABNORMAL HIGH (ref 70–99)
Phosphorus: 3.8 mg/dL (ref 2.5–4.6)
Potassium: 3.9 mmol/L (ref 3.5–5.1)
Sodium: 141 mmol/L (ref 135–145)

## 2020-03-18 LAB — TACROLIMUS LEVEL: Tacrolimus (FK506) - LabCorp: <1 ng/mL — ABNORMAL LOW (ref 2.0–20.0)

## 2020-03-18 LAB — GLUCOSE, CAPILLARY
Glucose-Capillary: 93 mg/dL (ref 70–99)
Glucose-Capillary: 135 mg/dL — ABNORMAL HIGH (ref 70–99)
Glucose-Capillary: 129 mg/dL — ABNORMAL HIGH (ref 70–99)

## 2020-03-18 LAB — HEPARIN ANTI-XA: Heparin LMW: 0.64 IU/mL

## 2020-03-18 MED ORDER — CALCIUM CITRATE-VITAMIN D 500-500 MG-UNIT PO CHEW
1.0000 | CHEWABLE_TABLET | Freq: Two times a day (BID) | ORAL | Status: DC
  Filled 2020-03-18: qty 1

## 2020-03-18 MED ORDER — ATORVASTATIN CALCIUM 10 MG PO TABS: 10.0000 mg | ORAL_TABLET | Freq: Every day | ORAL | Status: DC

## 2020-03-18 MED ORDER — CALCIUM CARBONATE-VITAMIN D 500-200 MG-UNIT PO TABS
1.0000 | ORAL_TABLET | Freq: Two times a day (BID) | ORAL | Status: DC
  Administered 2020-03-18 – 2020-03-24 (×13): 1 via ORAL
  Filled 2020-03-18 (×13): qty 1

## 2020-03-18 MED ORDER — ATORVASTATIN CALCIUM 10 MG PO TABS
10.0000 mg | ORAL_TABLET | Freq: Every day | ORAL | Status: DC
  Administered 2020-03-18 – 2020-03-22 (×5): 10 mg via ORAL
  Filled 2020-03-18 (×5): qty 1

## 2020-03-18 MED ORDER — ISOSORB DINITRATE-HYDRALAZINE 20-37.5 MG PO TABS
0.5000 | ORAL_TABLET | Freq: Three times a day (TID) | ORAL | Status: DC
  Administered 2020-03-18 – 2020-03-19 (×4): 0.5 via ORAL
  Filled 2020-03-18 (×5): qty 1

## 2020-03-18 MED ORDER — VITAMIN B-12 1000 MCG PO TABS
1000.0000 ug | ORAL_TABLET | Freq: Every day | ORAL | Status: DC
  Administered 2020-03-18 – 2020-03-24 (×7): 1000 ug via ORAL
  Filled 2020-03-18 (×7): qty 1

## 2020-03-18 NOTE — Progress Notes (Signed)
[name] Progress Note    Assessment/ Plan:   Assessment/Plan: 1.  Acute kidney injury in patient with chronic kidney disease stage III T: Baseline Cr is 1.7, up to 2.3.  Suspect the current acute kidney injury may be hemodynamically mediated in the setting of decreased effective arterial blood volume/interstitial fluid and ongoing diuresis possibly cardiorenal syndrome as well.  Continue pred 2.5 mg daily, Myfortic 540 mg BID, tacrolimus 3 mg BID (confirmed by pharmacy).  Level pending.   Avoid nephrotoxic medications including NSAIDs and iodinated intravenous contrast exposure unless the latter is absolutely indicated.  Preferred narcotic agents for pain control are hydromorphone, fentanyl, and methadone. Morphine should not be used. Avoid Baclofen and avoid oral sodium phosphate and magnesium citrate based laxatives / bowel preps. Continue strict Input and Output monitoring. Will monitor the patient closely with you and intervene or adjust therapy as indicated by changes in clinical status/labs .  2.  Acute hypoxic respiratory failure/volume overload/acute exacerbation of congestive heart failure:  Improved on IV Lasix.  On empiric azithro/ CTX.   3.  Anemia of chronic kidney disease: Significantly low hemoglobin and hematocrit noted at today's labs, iron studies with 9% tsat, allergic to IV iron.  Will give PO BID.    4.  Hypertension: Elevated blood pressure noted at this time, monitor with diuresis.  5.  Type 2 diabetes mellitus: Resume management with sliding scale insulin per primary service.  6.  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  Evaluation for plasma cell dyscrasia undertaken by primary service (SPEP negative) but other malignancy may be playing a role.   7.  RLE pain/ swelling- venous doppler negative, CT scan 9/10 femur and pelvis showing R distal iliopsoas muscle  enlargement, hemorrhage vs bursitis.  Would avoid contrasted scans.  She is on [facility] with Lovenox due to #9- has been stopped for now.  8.  New onset heart failURE: TTE EF 25-30%.  Heart failure following now, considering milrinone to augment diuresis.   9.  RUE DVT: hep gtt--> lovenox on hold for possible iliopsoas hemorrhage, LMWH level pending.  Subjective:    Had CT of pelvis and femur that showed R distal iliopsoas muscle enlargement, hemorrhage vs bursitis.  Had 4L UOP, robust diuresis.  Cr staying stable at 2.46 today.  Pt notes that breathing is improved.   Objective:   BP 140/84 (BP Location: Left Arm)   Pulse 73   Temp 98.8 F (37.1 C) (Oral)   Resp (!) 21   Ht 5\' 11"  (1.803 m)   Wt 113.2 kg   LMP 11/05/2012   SpO2 100%   BMI 34.81 kg/m   Intake/Output Summary (Last 24 hours) at 03/18/2020 1037 Last data filed at 03/18/2020 0454 Gross per 24 hour  Intake 240 ml  Output 2650 ml  Net -2410 ml   Weight change: -0.9 kg  Physical Exam: Gen: sitting up in bed CVS: RRR  Resp: breathing better- still some bibasilar crackles Abd: nondistended Ext: 2+ edema bilaterally LEs R >L, R arm swelling, no L arm swelling  Imaging: DG Chest 2 View  Result Date: 03/16/2020 CLINICAL DATA:  Tachycardia EXAM: CHEST - 2 VIEW COMPARISON:  03/14/2020 FINDINGS: Cardiac shadow is enlarged but stable. The lungs are well aerated bilaterally. No focal infiltrate is seen. Improvement in the vascular congestion seen on the prior exam is noted. No bony abnormality is seen. IMPRESSION: Improved vascular congestion.  No focal infiltrate  is seen. Electronically Signed   By: Inez [name] M.D.   On: 03/16/2020 14:55   CT PELVIS WO CONTRAST  Result Date: 03/17/2020 CLINICAL DATA:  Right leg pain increasing with movement and coughing. EXAM: CT PELVIS WITHOUT CONTRAST TECHNIQUE: Multidetector CT imaging of the pelvis was performed following the standard protocol without intravenous contrast. COMPARISON:   Abdominopelvic CT 10/21/2019. Whole-body bone scan 10/01/2019. FINDINGS: Urinary Tract: Transplant kidney noted in the right iliac fossa, grossly stable. The native kidneys are only partially imaged. No evidence of collecting system dilatation or abnormality of the urinary bladder. Bowel: No bowel wall thickening, distention or surrounding inflammation identified within the pelvis. Vascular/Lymphatic: No enlarged pelvic lymph nodes identified. Aortoiliac atherosclerosis. Vascular patency not addressed without contrast. Reproductive: The uterus and air is appear normal.  No adnexal mass. Other: Progressive anasarca with diffuse soft tissue edema. No ascites or focal extraluminal fluid collection in the pelvis. Musculoskeletal: No acute osseous findings. 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 Scattered lucent lesions in the pelvis are unchanged from 06/01/2018, consistent with benign findings. No pathologic fracture. Stable chronic degenerative disc disease at L5-S1. New asymmetric enlargement of the distal right iliopsoas muscle and tendon which could reflect hemorrhage or bursitis. IMPRESSION: 1. New asymmetric enlargement of the distal right iliopsoas muscle and tendon which could reflect hemorrhage or bursitis. 2. Progressive anasarca with diffuse soft tissue edema. 3. Stable appearance of transplant kidney in the right iliac fossa. 4. Aortic Atherosclerosis (ICD10-I70.0). Electronically Signed   By: Richardean Sale M.D.   On: 03/17/2020 16:55   NM Pulmonary Perfusion  Result Date: 03/17/2020 CLINICAL DATA:  54 year old female with shortness of breath for 1 day, cough x4 days. Respiratory failure. Tachycardia. EXAM: NUCLEAR MEDICINE PERFUSION LUNG SCAN TECHNIQUE: Perfusion images were obtained in multiple projections after intravenous injection of radiopharmaceutical. Ventilation scans intentionally deferred if perfusion scan and chest x-ray adequate for interpretation during COVID  19 epidemic. RADIOPHARMACEUTICALS:  4.2 mCi Tc-61m MAA IV COMPARISON:  Chest radiographs 03/16/2020 and earlier. FINDINGS: Expected photopenia related to cardiomegaly. Otherwise homogeneous appearing radiotracer activity throughout both lungs. No convincing perfusion defect. IMPRESSION: Cardiomegaly.  No perfusion defect to suggest pulmonary embolus. Electronically Signed   By: Genevie Ann M.D.   On: 03/17/2020 13:32   CT FEMUR RIGHT WO CONTRAST  Result Date: 03/17/2020 CLINICAL DATA:  Right leg pain worsening with movement and coughing. History of diabetes and renal failure. EXAM: CT OF THE LOWER RIGHT EXTREMITY WITHOUT CONTRAST TECHNIQUE: Multidetector CT imaging of the right lower extremity was performed according to the standard protocol. COMPARISON:  Pelvic CT today and 10/21/2019. FINDINGS: Bones/Joint/Cartilage No evidence of acute fracture, dislocation or femoral head avascular necrosis. The right hip joint space is preserved. Chronic lucent lesion in the right ischium is unchanged from 2019, consistent with a benign finding. There is joint space narrowing medially at the right knee and a small knee joint effusion. Ligaments Suboptimally assessed by CT. Muscles and Tendons Musculotendinous detail limited by generalized anasarca and body habitus. There is asymmetric enlargement of the distal right iliopsoas muscle and tendon, better seen on concurrent pelvic CT, which may reflect hemorrhage or bursitis. No other focal muscular abnormalities are identified. The extensor mechanism is intact at the knee. Soft tissues Generalized edema throughout the subcutaneous and deep soft tissues of the right thigh. No focal fluid collection, foreign body or soft tissue emphysema identified. Diffuse atherosclerotic calcification of the right femoral artery. IMPRESSION: 1. No acute or significant osseous findings. 2. Generalized  soft tissue edema throughout the right thigh, nonspecific, but may reflect anasarca or cellulitis.  No focal fluid collection, foreign body or soft tissue emphysema identified. 3. Asymmetric enlargement of the distal right iliopsoas muscle and tendon, better seen on concurrent pelvic CT, which may reflect hemorrhage or bursitis. Electronically Signed   By: Richardean Sale M.D.   On: 03/17/2020 16:55   [place] RT LOWER EXTREM LTD SOFT TISSUE NON VASCULAR  Result Date: 03/18/2020 CLINICAL DATA:  Right thigh pain and swelling EXAM: ULTRASOUND right LOWER EXTREMITY LIMITED TECHNIQUE: Ultrasound examination of the lower extremity soft tissues was performed in the area of clinical concern. COMPARISON:  CT 03/17/2020 FINDINGS: Joint Space: Not included Muscles: Included muscles are unremarkable. Tendons: Not visualized Other Soft Tissue Structures: Diffuse subcutaneous soft tissue edema. Prominent vascular structures in the right groin. No discrete mass or collection is identified. IMPRESSION: Diffuse subcutaneous soft tissue edema. Prominent vascular structures in the right groin. No discrete mass or collection is identified. Electronically Signed   By: Lucienne Capers M.D.   On: 03/18/2020 01:07   VAS [place] LOWER EXTREMITY VENOUS (DVT)  Result Date: 03/16/2020  Lower Venous DVTStudy Indications: Pitting edema, right groin pain, upper extremity DVT.  Limitations: Body habitus and poor ultrasound/tissue interface. Comparison Study: No prior study Performing Technologist: Maudry Mayhew MHA, RDMS, RVT, RDCS  Examination Guidelines: A complete evaluation includes B-mode imaging, spectral Doppler, color Doppler, and power Doppler as needed of all accessible portions of each vessel. Bilateral testing is considered an integral part of a complete examination. Limited examinations for reoccurring indications may be performed as noted. The reflux portion of the exam is performed with the patient in reverse Trendelenburg.  +---------+---------------+---------+-----------+----------+--------------+ RIGHT     CompressibilityPhasicitySpontaneityPropertiesThrombus Aging +---------+---------------+---------+-----------+----------+--------------+ CFV      Full           No       Yes        Patent                   +---------+---------------+---------+-----------+----------+--------------+ SFJ      Full                                                        +---------+---------------+---------+-----------+----------+--------------+ FV Prox                          Yes                                 +---------+---------------+---------+-----------+----------+--------------+ FV Mid                           Yes                                 +---------+---------------+---------+-----------+----------+--------------+ FV Distal                        Yes                                 +---------+---------------+---------+-----------+----------+--------------+ PFV  Yes                                 +---------+---------------+---------+-----------+----------+--------------+ POP      Full           Yes      Yes                                 +---------+---------------+---------+-----------+----------+--------------+ PTV      Full                    Yes                                 +---------+---------------+---------+-----------+----------+--------------+ PERO     Full                    Yes                                 +---------+---------------+---------+-----------+----------+--------------+   +---------+---------------+---------+-----------+----------+--------------+ LEFT     CompressibilityPhasicitySpontaneityPropertiesThrombus Aging +---------+---------------+---------+-----------+----------+--------------+ CFV      Full           No       Yes                                 +---------+---------------+---------+-----------+----------+--------------+ FV Prox                          Yes                                  +---------+---------------+---------+-----------+----------+--------------+ FV Mid                           Yes                                 +---------+---------------+---------+-----------+----------+--------------+ FV Distal                        Yes                                 +---------+---------------+---------+-----------+----------+--------------+ POP      Full           No       Yes                                 +---------+---------------+---------+-----------+----------+--------------+ PTV      Full                    Yes                                 +---------+---------------+---------+-----------+----------+--------------+ PERO     Full  Yes                                 +---------+---------------+---------+-----------+----------+--------------+   Left Technical Findings: Not visualized segments include SFJ, PFV,.   Summary: RIGHT: - There is no evidence of deep vein thrombosis in the lower extremity. However, portions of this examination were limited- see technologist comments above. - No cystic structure found in the popliteal fossa.  LEFT: - There is no evidence of deep vein thrombosis in the lower extremity. However, portions of this examination were limited- see technologist comments above. - No cystic structure found in the popliteal fossa.  Unable to perform all compression maneuvers secondary to significant pitting edema.  Pulsatile venous flow is suggestive of possbily elevated right heart pressure. *See table(s) above for measurements and observations. Electronically signed by Servando Snare MD on 03/16/2020 at 2:04:16 PM.    Final     Labs: BMET Recent Labs  Lab 03/14/20 0920 03/14/20 1529 03/15/20 0519 03/16/20 0652 03/17/20 0630 03/18/20 0205  NA 141  --  141 140 140 141  K 4.1  --  3.8 3.6 3.3* 3.9  CL 113*  --  112* 110 109 110  CO2 17*  --  21* 19* 20* 20*  GLUCOSE 145*  --  113* 149* 100*  102*  BUN 26*  --  26* 30* 32* 33*  CREATININE 2.23*  --  2.37* 2.35* 2.52* 2.46*  CALCIUM 8.1*  --  8.0* 7.6* 7.4* 7.4*  PHOS  --  3.6  --   --  4.0 3.8   CBC Recent Labs  Lab 03/14/20 0920 03/14/20 0920 03/15/20 0519 03/16/20 0556 03/17/20 0630 03/18/20 0205  WBC 8.8   < > 4.9 4.7 3.9* 4.2  NEUTROABS 7.8*  --   --   --   --   --   HGB 8.8*   < > 8.0* 8.2* 7.8* 7.7*  HCT 30.2*   < > 26.7* 26.9* 25.7* 25.5*  MCV 101.3*   < > 98.5 98.5 98.5 98.1  PLT 178   < > 140* 162 152 177   < > = values in this interval not displayed.    Medications:    . albuterol  2.5 mg Inhalation TID  . atorvastatin  10 mg Oral Once per day on Mon Wed Fri  . calcitRIOL  0.5 mcg Oral Daily  . calcium-vitamin D  1 tablet Oral BID  . enoxaparin (LOVENOX) injection  120 mg Subcutaneous Q12H  . ferrous sulfate  325 mg Oral BID WC  . furosemide  80 mg Intravenous BID  . insulin aspart  0-20 Units Subcutaneous TID WC  . mycophenolate  540 mg Oral BID  . pantoprazole  40 mg Oral Daily  . predniSONE  2.5 mg Oral Q breakfast  . sodium chloride flush  3 mL Intravenous Q12H  . tacrolimus  3 mg Oral BID  . vitamin B-12  1,000 mcg Oral Daily     Madelon Lips MD 03/18/2020, 10:37 AM

## 2020-03-18 NOTE — Progress Notes (Signed)
Patient ID: [name], female   DOB: Jan 24, 1966, 54 y.o.   MRN: [id number]     Advanced Heart Failure Rounding Note  PCP-Cardiologist: No primary care provider on file.   Subjective:    Diuresing well on Lasix 80 mg IV bid, weight down 5 lbs.  Creatinine stable at 2.46.   She appears to be in NSR this morning on amiodarone gtt.   CT pelvis/femur with right distal iliopsoas enlargement, hemorrhage versus bursitis.  Lovenox stopped today due to concern for hemorrhage.  Breathing improving.    Objective:   Weight Range: 113.2 kg Body mass index is 34.81 kg/m.   Vital Signs:   Temp:  [97.6 F (36.4 C)-98.8 F (37.1 C)] 98.5 F (36.9 C) (09/11 1129) Pulse Rate:  [70-86] 86 (09/11 1129) Resp:  [14-21] 16 (09/11 1129) BP: (108-144)/(66-97) 123/86 (09/11 1129) SpO2:  [100 %] 100 % (09/11 1129) Weight:  [113.2 kg] 113.2 kg (09/11 0453) Last BM Date: 03/16/20  Weight change: Filed Weights   03/16/20 1500 03/17/20 0319 03/18/20 0453  Weight: 114.1 kg 115.3 kg 113.2 kg    Intake/Output:   Intake/Output Summary (Last 24 hours) at 03/18/2020 1226 Last data filed at 03/18/2020 1110 Gross per 24 hour  Intake 480 ml  Output 3400 ml  Net -2920 ml      Physical Exam    General: NAD Neck: JVP 12-14 cm, no thyromegaly or thyroid nodule.  Lungs: Decreased at bases. CV: Nondisplaced PMI.  Heart regular S1/S2, no S3/S4, no murmur.  2+ edema to thighs. Abdomen: Soft, nontender, no hepatosplenomegaly, no distention.  Skin: Intact without lesions or rashes.  Neurologic: Alert and oriented x 3.  Psych: Normal affect. Extremities: No clubbing or cyanosis.  HEENT: Normal.    Telemetry   ?NSR with low voltage Ps in 80s (personally reviewed)  EKG   pending.   Labs    CBC Recent Labs    03/17/20 0630 03/18/20 0205  WBC 3.9* 4.2  HGB 7.8* 7.7*  HCT 25.7* 25.5*  MCV 98.5 98.1  PLT 152 702   Basic Metabolic Panel Recent Labs    03/17/20 0630 03/18/20 0205  NA  140 141  K 3.3* 3.9  CL 109 110  CO2 20* 20*  GLUCOSE 100* 102*  BUN 32* 33*  CREATININE 2.52* 2.46*  CALCIUM 7.4* 7.4*  PHOS 4.0 3.8   Liver Function Tests Recent Labs    03/16/20 0652 03/16/20 0652 03/17/20 0630 03/18/20 0205  AST 16  --   --   --   ALT 14  --   --   --   ALKPHOS 1,000*  --   --   --   BILITOT 0.4  --   --   --   PROT 5.1*  --   --   --   ALBUMIN 2.7*   < > 2.6* 2.7*   < > = values in this interval not displayed.   No results for input(s): LIPASE, AMYLASE in the last 72 hours. Cardiac Enzymes No results for input(s): CKTOTAL, CKMB, CKMBINDEX, TROPONINI in the last 72 hours.  BNP: BNP (last 3 results) Recent Labs    03/14/20 0920  BNP >4,500.0*    ProBNP (last 3 results) No results for input(s): PROBNP in the last 8760 hours.   D-Dimer No results for input(s): DDIMER in the last 72 hours. Hemoglobin A1C No results for input(s): HGBA1C in the last 72 hours. Fasting Lipid Panel No results for input(s): CHOL, HDL,  LDLCALC, TRIG, CHOLHDL, LDLDIRECT in the last 72 hours. Thyroid Function Tests Recent Labs    03/16/20 1525  TSH 1.356    Other results:   Imaging    CT PELVIS WO CONTRAST  Result Date: 03/17/2020 CLINICAL DATA:  Right leg pain increasing with movement and coughing. EXAM: CT PELVIS WITHOUT CONTRAST TECHNIQUE: Multidetector CT imaging of the pelvis was performed following the standard protocol without intravenous contrast. COMPARISON:  Abdominopelvic CT 10/21/2019. Whole-body bone scan 10/01/2019. FINDINGS: Urinary Tract: Transplant kidney noted in the right iliac fossa, grossly stable. The native kidneys are only partially imaged. No evidence of collecting system dilatation or abnormality of the urinary bladder. Bowel: No bowel wall thickening, distention or surrounding inflammation identified within the pelvis. Vascular/Lymphatic: No enlarged pelvic lymph nodes identified. Aortoiliac atherosclerosis. Vascular patency not  addressed without contrast. Reproductive: The uterus and air is appear normal.  No adnexal mass. Other: Progressive anasarca with diffuse soft tissue edema. No ascites or focal extraluminal fluid collection in the pelvis. Musculoskeletal: No acute osseous findings. 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 Scattered lucent lesions in the pelvis are unchanged from 06/01/2018, consistent with benign findings. No pathologic fracture. Stable chronic degenerative disc disease at L5-S1. New asymmetric enlargement of the distal right iliopsoas muscle and tendon which could reflect hemorrhage or bursitis. IMPRESSION: 1. New asymmetric enlargement of the distal right iliopsoas muscle and tendon which could reflect hemorrhage or bursitis. 2. Progressive anasarca with diffuse soft tissue edema. 3. Stable appearance of transplant kidney in the right iliac fossa. 4. Aortic Atherosclerosis (ICD10-I70.0). Electronically Signed   By: Richardean Sale M.D.   On: 03/17/2020 16:55   NM Pulmonary Perfusion  Result Date: 03/17/2020 CLINICAL DATA:  54 year old female with shortness of breath for 1 day, cough x4 days. Respiratory failure. Tachycardia. EXAM: NUCLEAR MEDICINE PERFUSION LUNG SCAN TECHNIQUE: Perfusion images were obtained in multiple projections after intravenous injection of radiopharmaceutical. Ventilation scans intentionally deferred if perfusion scan and chest x-ray adequate for interpretation during COVID 19 epidemic. RADIOPHARMACEUTICALS:  4.2 mCi Tc-62m MAA IV COMPARISON:  Chest radiographs 03/16/2020 and earlier. FINDINGS: Expected photopenia related to cardiomegaly. Otherwise homogeneous appearing radiotracer activity throughout both lungs. No convincing perfusion defect. IMPRESSION: Cardiomegaly.  No perfusion defect to suggest pulmonary embolus. Electronically Signed   By: Genevie Ann M.D.   On: 03/17/2020 13:32   CT FEMUR RIGHT WO CONTRAST  Result Date: 03/17/2020 CLINICAL DATA:  Right  leg pain worsening with movement and coughing. History of diabetes and renal failure. EXAM: CT OF THE LOWER RIGHT EXTREMITY WITHOUT CONTRAST TECHNIQUE: Multidetector CT imaging of the right lower extremity was performed according to the standard protocol. COMPARISON:  Pelvic CT today and 10/21/2019. FINDINGS: Bones/Joint/Cartilage No evidence of acute fracture, dislocation or femoral head avascular necrosis. The right hip joint space is preserved. Chronic lucent lesion in the right ischium is unchanged from 2019, consistent with a benign finding. There is joint space narrowing medially at the right knee and a small knee joint effusion. Ligaments Suboptimally assessed by CT. Muscles and Tendons Musculotendinous detail limited by generalized anasarca and body habitus. There is asymmetric enlargement of the distal right iliopsoas muscle and tendon, better seen on concurrent pelvic CT, which may reflect hemorrhage or bursitis. No other focal muscular abnormalities are identified. The extensor mechanism is intact at the knee. Soft tissues Generalized edema throughout the subcutaneous and deep soft tissues of the right thigh. No focal fluid collection, foreign body or soft tissue emphysema identified. Diffuse atherosclerotic calcification  of the right femoral artery. IMPRESSION: 1. No acute or significant osseous findings. 2. Generalized soft tissue edema throughout the right thigh, nonspecific, but may reflect anasarca or cellulitis. No focal fluid collection, foreign body or soft tissue emphysema identified. 3. Asymmetric enlargement of the distal right iliopsoas muscle and tendon, better seen on concurrent pelvic CT, which may reflect hemorrhage or bursitis. Electronically Signed   By: Richardean Sale M.D.   On: 03/17/2020 16:55   [place] RT LOWER EXTREM LTD SOFT TISSUE NON VASCULAR  Result Date: 03/18/2020 CLINICAL DATA:  Right thigh pain and swelling EXAM: ULTRASOUND right LOWER EXTREMITY LIMITED TECHNIQUE: Ultrasound  examination of the lower extremity soft tissues was performed in the area of clinical concern. COMPARISON:  CT 03/17/2020 FINDINGS: Joint Space: Not included Muscles: Included muscles are unremarkable. Tendons: Not visualized Other Soft Tissue Structures: Diffuse subcutaneous soft tissue edema. Prominent vascular structures in the right groin. No discrete mass or collection is identified. IMPRESSION: Diffuse subcutaneous soft tissue edema. Prominent vascular structures in the right groin. No discrete mass or collection is identified. Electronically Signed   By: Lucienne Capers M.D.   On: 03/18/2020 01:07     Medications:     Scheduled Medications: . albuterol  2.5 mg Inhalation TID  . atorvastatin  10 mg Oral Once per day on Mon Wed Fri  . calcitRIOL  0.5 mcg Oral Daily  . calcium-vitamin D  1 tablet Oral BID  . enoxaparin (LOVENOX) injection  120 mg Subcutaneous Q12H  . ferrous sulfate  325 mg Oral BID WC  . furosemide  80 mg Intravenous BID  . insulin aspart  0-20 Units Subcutaneous TID WC  . isosorbide-hydrALAZINE  0.5 tablet Oral TID  . mycophenolate  540 mg Oral BID  . pantoprazole  40 mg Oral Daily  . predniSONE  2.5 mg Oral Q breakfast  . sodium chloride flush  3 mL Intravenous Q12H  . tacrolimus  3 mg Oral BID  . vitamin B-12  1,000 mcg Oral Daily    Infusions: . amiodarone 30 mg/hr (03/17/20 1111)    PRN Medications: acetaminophen **OR** acetaminophen, albuterol, guaiFENesin-dextromethorphan    Patient Profile   54 y/o AAF w/ h/o HTN, T2DM, tobacco abuse, CAD (extensive CAD on prior chest CT) and CKD s/p rt renal transplant in 2011 at [facility]. She is followed by [facility]. She has chronic LBBB but no other prior cardiac history.  She has a +FH of CHF (sister 44).   Assessment/Plan   1. Acute Systolic Heart Failure: - NYHA Class IIIb w/ marked fluid overload. BNP >4,500  - New diagnosis. EF 25-30 w/ diffuse hypokinesis. RV ok. Severe concentric LVH. No prior studies for  comparison.  - suspect hypertensive cardiomyopathy 2/2 long standing HTN - also ?ischemic cardiomyopathy. Chest CT 4/21 shows multivessel CAD w/ heavily calcified LAD. Plan noninvasive ischemic w/u  Myoview. Avoid cath w/ CKD.  - ? Cardiac Amyloid. Protein Electrophoresis - no M Spike.   - ? Tachy-Mediated.  - Still volume overloaded, creatinine stable with diuresis at 2.46 so will continue Lasix 80 mg IV bid today given good response.  - no ARB/ARNi, spironolactone or digoxin w/ CKD - Stable BP, can start Bidil 1/2 tab tid.  - no ? blocker yet w/ acute decompensation - consider future SGLT2i w/ concomitant CKD and DM, pending GFR (will avoid for now given GFR <25)    2. Acute on Chronic Kidney Disease: - Nephrology following.  - Possible cardiorenal syndrome.  - s/p rt  renal transplant in 2011 at [facility].  - on prednisone, Myfortic and tacrolimus  - Creatinine stable at 2.46 today, follow closely with diuresis.  - 3. Atrial Fibrillation:  - New. Went into rapid AF 140s on 9/8 - She looks like she is in NSR with low voltage P's on amiodarone gtt (continue).   - ECG today.  -  Lovenox stopped today with concern for iliopsoas hemorrhage.   4. Presumed CAP: - admit CXR suggestive of CHF w/ interstial edema as well as findings concerning for superimposed infection. PCT 2.65. Lactic acid 1.9. AF. Blood cultures negative - continue abx therapy w/ ceftriaxone + azithromycin  5. CAD: - prior chest CT 4/21 reviewed and showed multivessel CAD w/ heavily calcified LAD - not a candidate for LHC currently w/ abnormal renal function  - will plan nuclear study to assess for coronary ischemia prior to d/c  - continue statin, increased atorvastatin to 40 mg night to target LDL < 70  - no ASA w/ chronic anemia and need for anticoagulation for DVT - hold ? blocker initiation while in acute decompensated HF  - smoking cessation imperative   6. RUE DVT: - no recent trauma. Concern for  malignancy.  - V/Q scan negative.  - lower extremity doppler negative.  - Now off Lovenox because of concern for iliopsoas hemorrhage.   7. Anemia of Chronic Disease/ Iron Deficiency Anemia: - 2/2 CKD - Hgb 8.2 c/w baseline - Fe low. Sats Ratio 9% - Allergic to IV iron - On ferrous sulfate 325 BID   8. HTN: - Controlled.   9. T2DM: - well controlled, Hgb A1c 5.0  - GFR borderline for SGLT2i   10. Tobacco Abuse:  - smokes 1/2 ppm  - smoking cessation advised   11. HLD - known CAD based on Chest CT - recent outpatient LP 6/21 showed elevated LDL at 84 mg/dL - increase home atorvastatin to 40 mg nightly - needs repeat FLP and HFTs in 6-8 weeks   12. Elevated Alk Phos/ Abnormal Bone Scans  - Alk Phos 1,300 on admit  - Had whole body bone scan 3/21 w/ diffuse uptake throughout the axial and appendicular skeleton suspicious for metastatic disease - undergoing w/u for Multiple Myeloma => SPEP negative.   13. ?Iliopsoas hemorrhage - CT femur/pelvis with right distal iliopsoas enlargement => hemorrhage versus bursitis.  Lovenox stopped.  Evaluation per primary service. Would ideally be on anticoagulation for atrial fibrillation and RUE DVT.   Length of Stay: 4  Loralie Champagne, MD  03/18/2020, 12:26 PM  Advanced Heart Failure Team Pager [phone] (M-F; 7a - 4p)  Please contact [facility] for night-coverage after hours (4p -7a ) and weekends on [email or web address]

## 2020-03-18 NOTE — Progress Notes (Signed)
Subjective: Ms. Pelphrey had a recurrence of her R thigh pain overnight requiring dilaudid. She reports having a negative experience with the CT yesterday, saying her pain and crying was ignored by the CT techs who jostled and threw her leg around in ways that triggered severe pain that could have been avoided had they followed her instructions on how to help her move into the CT scanner. She denies having pain with the ultrasound overnight.  She expresses understanding of her condition and appreciation for the care provided to her by the team. She affirms that she will accept whatever treatments we decide upon as a team and expresses her goal is to do whatever is safest and helps her health improve efficiently.  Objective: Vital signs in last 24 hours: Vitals:   03/17/20 2037 03/17/20 2100 03/18/20 0056 03/18/20 0453  BP:  (!) 144/97 (!) 144/92 108/73  Pulse: 79 75 77 70  Resp: 20 17 14 18   Temp:  98.5 F (36.9 C) 98.5 F (36.9 C) 98.4 F (36.9 C)  TempSrc:  Oral Oral Oral  SpO2: 100% 100% 100% 100%  Weight:    113.2 kg  Height:       Weight change: -0.9 kg  Intake/Output Summary (Last 24 hours) at 03/18/2020 0624 Last data filed at 03/18/2020 0454 Gross per 24 hour  Intake 480 ml  Output 4000 ml  Net -3520 ml   Physical Exam Constitutional:  Appearance: She is obese. She is slightly uncomfortable-appearing HENT:  Head: Normocephalicand atraumatic. Cardiovascular:  Rate and Rhythm: Regular Pulmonary:  Breath sounds:Very soft ralespresent in lower lung fields improved from yesterday.Wheezes present in neck and throughout lung fields, particularly prominent during inspiration. Speaking in full, uninterrupted sentences with occasional productive coughing. Abdominal:  Palpations: Abdomen is softand visibly edematous. Musculoskeletal:  Right lower leg: Mild tendernesspresent in calf, severe tenderness present in upper thigh.2+ edemapresent from  foot tothigh. Right leg visibly more swollen than left, particularly in the thigh. R thigh exquisitely tender to palpation and painful with passive movement of the leg. Left lower leg: Mild tendernesspresent.2+ edemapresent from foot to patella. Arms: Edematous, peau d'orange skin present on R arm. Edema is improved from yesterday. L arm not swollen. Skin: Coloration: Skin is not cyanoticor pale.  Findings: No ecchymosis,erythemaor rash.  Nails: There is no clubbing. Peau d'orange skin present on lower legs. Neurological:  General: No focal deficitpresent.  Mental Status: She is alertand oriented to person, place, and time.  Psychiatric:  Mood and Affect: Moodnormal.  Behavior: Behaviornormal.  Labs: Hgb 7.7, Cr 2.46 from 2.52, Ca 7.4 from 7.4, albumin 2.7 from 2.6  CT pelvis yesterday revealed asymmetrical swelling of distal iliopsoas muscle, consistent with either hemorrhage or bursitis.  Pulmonary perfusion scan revealed no evidence of PE, only evidence of cardiomegaly.  US Soft tissue showed diffuse soft tissue edema and prominent vascular structures in the right groin.  Assessment/Plan:  Active Problems:   Respiratory failure ([place])  Angelisse Riso is a 54 year old woman with a PMH significant for DMII, GERD, hypertension, renal transplant,CKD, and tobacco usewho presentedwith SOB, orthopnea, and anasarca, found to have new diagnoses of HFrEF, atrial fibrillation with RVR, and R axillary vein DVT.  Thigh pain--possible R iliopsoas hematoma/hemorrhage LE dopplerrevealed no DVT. Non-contrast CT of pelvis and LE revealed asymmetric swelling of distal R iliopsoas muscle, consistent with either bursitis or hemorrhage. It would appear that this is the mostly likely cause of her R thigh pain. Hemorrhage seems particularly likely given the timeline:  her Hgb was 8.8 prior to this pain and has dropped to 7.7 since, with an acute drop to 8.0  around the time she began complaining of the pain, all in the setting of appropriate anticoagulation for her R axillary vein DVT. Treating this will be difficult given the need for continued anticoagulation for her DVT. We have discussed her case with vascular surgery, who feel there is nothing they can offer, and with nephrology, who prefer we not perform CT with contrast given her tenuous renal function. For now, we will plan to treat her pain and continue with anticoagulation while closely monitoring her leg for compartment syndrome and sudden drops in her hemoglobin. - Dilaudid PRN q8hrs - Repeat CBC this afternoon, at least daily from now on and possibly q12 depending on results  Acute respiratory failure Improving. Satting 100% on 2 L O2. Likely related to HFrEF. CAP less likely now given improving CXR shows no focal consolidation. PE effectively ruled out by normal pulmonary perfusion scan yesterday. - Scheduledalbuterol inhaler for wheezing - Diurese with IV furosemide80 mg BID - Daily weights - Respiratory viral pathogen panel pending - Titrate supplemental O2 as needed - Blood culturesNGTD  HFrEF New diagnosis based on echo, 25-30% EF. HF team feels most likely hypertensive cardiomyopathy, possible ischemic cardiomyopathy given CT 4/21 showing multivessel CAD w/ heavily calcified LAD. Amyloid less likely given normal SPEP. Also possibly due to tachycardia from rapid Afib. TSH normal, so not thyroid-related. - Cardiology consulted, appreciate recommendations - Noninvasive ischemic workup with NST, avoid catheterization given CKD - Lasix 80 mg BID - Respiratory viral pathogen panel ordered - Unna boots - Consider low dose Bidil after IV diuresis if BP permits - Consider future SGLT2 inhibitor pending GFR  Atrial fibrillation with RVR CHA2DS2-VAScScore = 4(1 point each for HTN, DM, vascular disease, and gender).  - Continue amiodarone IV infusion 30 mg/hr - Lovenox - Monitor  on tele  CAD: Prior chest CT 4/21 reviewed and showed multivessel CAD w/ heavily calcified LAD. Not a candidate for LHC currently w/ abnormal renal function  - HF team will plan nuclear study to assess for coronary ischemia prior to d/c  - will wait to increase atorvastatin to 40 mg night to target LDL <70 given acute possibly muscular pain in the setting of new amiodarone infusion and statin use - no ASA w/ chronic anemia and need for anticoagulation for DVT - hold?blocker initiation while in acute decompensated HF  - smoking cessation discussed with patient, who affirms her resolve to abstain following discharge  DVT Unclear etiology for the DVT present in her right axillary vein. Possiblyassociated with hernephrotic syndrome leading toATIII level 57% of normal, although her low ATIII could be from her heparin. Switched to LMWH 9/9. PTT >200 on 9/10. -120 mg lovenox daily - Check anti-Xa level 4 hours after 4th dose of LMWH (today at 1400) - Daily CBCand monitor for bleeding - Keep R arm elevated as much as possible  Acute kidney injury in patient with chronic kidney disease stage IIIs/p transplant Patient presents with worsening of her kidney function over the last several months. Significant anasarca and proteinuria concerning for nephrotic syndrome vs other paraproteinemia, supported by UPCR in nephrotic range. Free light chain and SPEP unconcerning for malignancy. - Nephrology consulted, appreciate recs - Prednisone 2.5 mg daily - Mycophenolate 540 mg BID - Tacrolimus 3 mg BID - Checking tacrolimus level - Avoid NSAIDs, morphine, baclofen, oral sodium phosphate and mag citrate-based laxatives, and iodinated IV contrast -  Strict I/Os - Daily renal function panel  Elevated alkaline phosphatase, bony lesions Likely due to longstanding secondary hyperparathyroidism given reassuring SPEP.  Secondary hyperparathyroidism PTH up to 724from 324 1 year ago. Low Ca, normal  phos, and longstanding elevated PTH, consistent with secondary hyperparathyroidism. - Pending 25-hydroxy vitamin D level - Restart home calcitriol0.5 mcg daily - Increase calcium-vitamin D to 500-500 mg BID  Anemia of chronic disease Iron studies and overall picture consistent with anemia of chronic disease. She will likely benefit from calcium and vitamin D supplementation and possibly need EPO per nephrology's recommendations. - Vitamin B12 level low, will supplement with 250 mcg B12 daily - 325 mg ferrous sulfate BID  DMII SSI Novolog resistant while inpatient  GERD Pantoprazole 40 mg daily  HTN Holding home amlodipine 10 mg daily for now.  LOS: 4 days   Mardella Layman, Medical Student 03/18/2020, 6:24 AM

## 2020-03-18 NOTE — Progress Notes (Signed)
[place] for Lovenox Indication: DVT (RUE), rule out PE  Allergies  Allergen Reactions  . Infed [Iron Dextran] Other (See Comments)    Pt has chest pain  Chest pain  . Cefazolin Itching and Nausea And Vomiting  . Ancef [Cefazolin Sodium] Other (See Comments)    Tolerated rocephin 11/03/10 Reports ancef causes itching, sweating, vomiting (x2-2009?)  . Lisinopril Swelling  . Other     Pt may be allergic to another antibiotic daughter isn't sure and pt unable to verify.    Patient Measurements: Height: 5\' 11"  (180.3 cm) Weight: 113.2 kg (249 lb 9 oz) IBW/kg (Calculated) : 70.8  Vital Signs: Temp: 98.5 F (36.9 C) (09/11 1129) Temp Source: Oral (09/11 1129) BP: 123/86 (09/11 1129) Pulse Rate: 86 (09/11 1129)  Labs: Recent Labs    03/15/20 2023 03/16/20 0556 03/16/20 0556 03/16/20 0652 03/16/20 1525 03/17/20 0630 03/18/20 0205 03/18/20 1326  HGB  --  8.2*   < >  --   --  7.8* 7.7*  --   HCT  --  26.9*  --   --   --  25.7* 25.5*  --   PLT  --  162  --   --   --  152 177  --   APTT  --   --   --   --  >200*  --   --   --   LABPROT  --   --   --   --  15.5*  --   --   --   INR  --   --   --   --  1.3*  --   --   --   HEPARINUNFRC 0.19* 0.13*  --   --   --   --   --   --   HEPRLOWMOCWT  --   --   --   --   --   --   --  0.64  CREATININE  --   --   --  2.35*  --  2.52* 2.46*  --    < > = values in this interval not displayed.    Estimated Creatinine Clearance: 35.4 mL/min (A) (by C-G formula based on SCr of 2.46 mg/dL (H)).   Medical History: Past Medical History:  Diagnosis Date  . Blood transfusion without reported diagnosis   . 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 GERD (gastroesophageal reflux disease)    medication induced  . Hypertension   . Renal failure    hx of  . 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 albuterol  2.5 mg Inhalation TID  . atorvastatin  10 mg Oral Daily  . calcitRIOL  0.5 mcg Oral Daily  . calcium-vitamin D  1 tablet Oral BID  . enoxaparin (LOVENOX) injection  120 mg Subcutaneous Q12H  . ferrous sulfate  325 mg Oral BID WC  . furosemide  80 mg Intravenous BID  . insulin aspart  0-20 Units Subcutaneous TID WC  . isosorbide-hydrALAZINE  0.5 tablet Oral TID  . mycophenolate  540 mg Oral BID  . pantoprazole  40 mg Oral Daily  .  predniSONE  2.5 mg Oral Q breakfast  . sodium chloride flush  3 mL Intravenous Q12H  . tacrolimus  3 mg Oral BID  . vitamin B-12  1,000 mcg Oral Daily    Assessment: 54 YO female initially started on heparin for thrombus in RUE.  Concern for PE due to shortness of breath and evidence of right ventricular dysfunction and increased pulmonary artery pressure. Negative for DVT in LE. Pharmacy asked to switch IV heparin to Lovenox given continued subtherapeutic levels and AT3 deficiency.  Patient with worsening renal function but making good uop with IV lasix. Will adjust dose frequency and monitor renal function/UOP closely. Hgb slow trend down, plts wnl. No overt bleeding noted.  Anti-Xa level = 0.64, therapeutic. HF team wants to hold lovenox now d/t iliopsoas enlargement / possible hemorrhage. If resumed will continue same dose as before.  Goal of Therapy:  Anti-Xa level 0.6-1 units/ml 4hrs after LMWH dose given at steady state Monitor platelets by anticoagulation protocol: Yes   Plan:  Hold Lovenox 120mg  SQ Q12 hrs Monitor CBC, s/sx bleeding F/u long-term PO AC plans  Mercy Riding, PharmD PGY1 Acute Care Pharmacy Resident Please refer to [facility] for unit-specific pharmacist

## 2020-03-19 DIAGNOSIS — I5043 Acute on chronic combined systolic (congestive) and diastolic (congestive) heart failure: Secondary | ICD-10-CM

## 2020-03-19 DIAGNOSIS — I5020 Unspecified systolic (congestive) heart failure: Secondary | ICD-10-CM

## 2020-03-19 LAB — CBC WITH DIFFERENTIAL/PLATELET
Abs Immature Granulocytes: 0.02 10*3/uL (ref 0.00–0.07)
Basophils Absolute: 0 10*3/uL (ref 0.0–0.1)
Basophils Relative: 0 %
Eosinophils Absolute: 0.1 10*3/uL (ref 0.0–0.5)
Eosinophils Relative: 3 %
HCT: 24.7 % — ABNORMAL LOW (ref 36.0–46.0)
Hemoglobin: 7.5 g/dL — ABNORMAL LOW (ref 12.0–15.0)
Immature Granulocytes: 1 %
Lymphocytes Relative: 16 %
Lymphs Abs: 0.7 10*3/uL (ref 0.7–4.0)
MCH: 28.8 pg (ref 26.0–34.0)
MCHC: 30.4 g/dL (ref 30.0–36.0)
MCV: 95 fL (ref 80.0–100.0)
Monocytes Absolute: 0.4 10*3/uL (ref 0.1–1.0)
Monocytes Relative: 9 %
Neutro Abs: 3.1 10*3/uL (ref 1.7–7.7)
Neutrophils Relative %: 71 %
Platelets: 178 10*3/uL (ref 150–400)
RBC: 2.6 MIL/uL — ABNORMAL LOW (ref 3.87–5.11)
RDW: 15.1 % (ref 11.5–15.5)
WBC: 4.4 10*3/uL (ref 4.0–10.5)
nRBC: 0 % (ref 0.0–0.2)

## 2020-03-19 LAB — CBC
HCT: 22.7 % — ABNORMAL LOW (ref 36.0–46.0)
Hemoglobin: 7 g/dL — ABNORMAL LOW (ref 12.0–15.0)
MCH: 29.2 pg (ref 26.0–34.0)
MCHC: 30.8 g/dL (ref 30.0–36.0)
MCV: 94.6 fL (ref 80.0–100.0)
Platelets: 201 10*3/uL (ref 150–400)
RBC: 2.4 MIL/uL — ABNORMAL LOW (ref 3.87–5.11)
RDW: 15.2 % (ref 11.5–15.5)
WBC: 4.9 10*3/uL (ref 4.0–10.5)
nRBC: 0 % (ref 0.0–0.2)

## 2020-03-19 LAB — GLUCOSE, CAPILLARY
Glucose-Capillary: 93 mg/dL (ref 70–99)
Glucose-Capillary: 103 mg/dL — ABNORMAL HIGH (ref 70–99)
Glucose-Capillary: 113 mg/dL — ABNORMAL HIGH (ref 70–99)
Glucose-Capillary: 187 mg/dL — ABNORMAL HIGH (ref 70–99)

## 2020-03-19 LAB — RENAL FUNCTION PANEL
Albumin: 2.6 g/dL — ABNORMAL LOW (ref 3.5–5.0)
Anion gap: 10 (ref 5–15)
BUN: 32 mg/dL — ABNORMAL HIGH (ref 6–20)
CO2: 22 mmol/L (ref 22–32)
Calcium: 7.6 mg/dL — ABNORMAL LOW (ref 8.9–10.3)
Chloride: 109 mmol/L (ref 98–111)
Creatinine, Ser: 2.52 mg/dL — ABNORMAL HIGH (ref 0.44–1.00)
GFR calc Af Amer: 24 mL/min — ABNORMAL LOW (ref >60)
GFR calc non Af Amer: 21 mL/min — ABNORMAL LOW (ref >60)
Glucose, Bld: 95 mg/dL (ref 70–99)
Phosphorus: 3.6 mg/dL (ref 2.5–4.6)
Potassium: 3.7 mmol/L (ref 3.5–5.1)
Sodium: 141 mmol/L (ref 135–145)

## 2020-03-19 LAB — CULTURE, BLOOD (ROUTINE X 2)
Culture: NO GROWTH
Culture: NO GROWTH
Special Requests: ADEQUATE
Special Requests: ADEQUATE

## 2020-03-19 LAB — ANTITHROMBIN III: AntiThromb III Func: 72 % — ABNORMAL LOW (ref 75–120)

## 2020-03-19 MED ORDER — FUROSEMIDE 10 MG/ML IJ SOLN
60.0000 mg | Freq: Two times a day (BID) | INTRAMUSCULAR | Status: DC
  Administered 2020-03-19 – 2020-03-21 (×5): 60 mg via INTRAVENOUS
  Filled 2020-03-19 (×5): qty 6

## 2020-03-19 MED ORDER — GUAIFENESIN 200 MG PO TABS
200.0000 mg | ORAL_TABLET | ORAL | Status: DC | PRN
  Administered 2020-03-20: 200 mg via ORAL
  Filled 2020-03-19 (×2): qty 1

## 2020-03-19 MED ORDER — LORATADINE 10 MG PO TABS
10.0000 mg | ORAL_TABLET | Freq: Every day | ORAL | Status: DC
  Administered 2020-03-19 – 2020-03-24 (×6): 10 mg via ORAL
  Filled 2020-03-19 (×6): qty 1

## 2020-03-19 MED ORDER — ISOSORB DINITRATE-HYDRALAZINE 20-37.5 MG PO TABS
1.0000 | ORAL_TABLET | Freq: Three times a day (TID) | ORAL | Status: DC
  Administered 2020-03-19 – 2020-03-22 (×9): 1 via ORAL
  Filled 2020-03-19 (×10): qty 1

## 2020-03-19 NOTE — Progress Notes (Signed)
HD#5 Subjective:  Overnight Events: Patient's enoxaparin was discontinued yesterday evening by pharmacy.    Patient admits to improvement of her shortness of breath and swelling overnight. She continues to have significant pain in her right hip particularly with movement. She states that she has some rhinorrhea with post nasal drip causing her to cough which exacerbates her pain.   Objective:  Vital signs in last 24 hours: Vitals:   03/19/20 0436 03/19/20 0812 03/19/20 0827 03/19/20 1058  BP: 125/81  (!) 150/106 120/72  Pulse: 83  88 80  Resp: (!) 23  (!) 22 (!) 21  Temp: 98.5 F (36.9 C)  97.7 F (36.5 C) 98.6 F (37 C)  TempSrc: Oral  Oral Oral  SpO2: 100% 98% 100% 100%  Weight: 110.5 kg     Height:       Supplemental O2: Room Air SpO2: 100 % O2 Flow Rate (L/min): 2 L/min FiO2 (%): 99 %   Physical Exam:  Physical Exam Constitutional:      Appearance: She is well-developed. She is obese.  HENT:     Head: Normocephalic and atraumatic.  Cardiovascular:     Rate and Rhythm: Normal rate.     Pulses: Normal pulses.  Pulmonary:     Effort: Pulmonary effort is normal.     Breath sounds: Normal breath sounds.  Abdominal:     General: Abdomen is flat. There is no distension.  Musculoskeletal:        General: Swelling (dependent swelling around hips) and tenderness (right hip) present.     Cervical back: Normal range of motion.     Right lower leg: Edema present.     Left lower leg: Edema present.  Skin:    General: Skin is warm and dry.     Coloration: Skin is not pale.  Neurological:     General: No focal deficit present.     Mental Status: She is alert and oriented to person, place, and time.     Filed Weights   03/17/20 0319 03/18/20 0453 03/19/20 0436  Weight: 115.3 kg 113.2 kg 110.5 kg     Intake/Output Summary (Last 24 hours) at 03/19/2020 1352 Last data filed at 03/19/2020 0948 Gross per 24 hour  Intake 240 ml  Output 3650 ml  Net -3410 ml   Net  IO Since Admission: -9,256.04 mL [03/19/20 1352]  Pertinent Labs: CBC Latest Ref Rng & Units 03/19/2020 03/18/2020 03/18/2020  WBC 4.0 - 10.5 K/uL 4.4 4.8 4.2  Hemoglobin 12.0 - 15.0 g/dL 7.5(L) 7.6(L) 7.7(L)  Hematocrit 36 - 46 % 24.7(L) 24.8(L) 25.5(L)  Platelets 150 - 400 K/uL 178 161 177    CMP Latest Ref Rng & Units 03/19/2020 03/18/2020 03/17/2020  Glucose 70 - 99 mg/dL 95 102(H) 100(H)  BUN 6 - 20 mg/dL 32(H) 33(H) 32(H)  Creatinine 0.44 - 1.00 mg/dL 2.52(H) 2.46(H) 2.52(H)  Sodium 135 - 145 mmol/L 141 141 140  Potassium 3.5 - 5.1 mmol/L 3.7 3.9 3.3(L)  Chloride 98 - 111 mmol/L 109 110 109  CO2 22 - 32 mmol/L 22 20(L) 20(L)  Calcium 8.9 - 10.3 mg/dL 7.6(L) 7.4(L) 7.4(L)  Total Protein 6.5 - 8.1 g/dL - - -  Total Bilirubin 0.3 - 1.2 mg/dL - - -  Alkaline Phos 38 - 126 U/L - - -  AST 15 - 41 U/L - - -  ALT 0 - 44 U/L - - -    Pending Labs: Antithrombin 3 repeat.  Imaging: No results  found.  Assessment/Plan:   Active Problems:   Type II diabetes mellitus, well controlled ([place])   S/p cadaver renal transplant   Respiratory failure ([place])   Heart failure with reduced ejection fraction [facility])     Patient Summary: [name] is a 54 year old woman with a PMH significant for DMII, GERD, hypertension, renal transplant,CKD, and tobacco usewho presentedwith SOB, orthopnea, and anasarca, found to have new diagnoses of HFrEF, atrial fibrillation with RVR, and R axillary vein DVT.  Thigh pain--possible R iliopsoas hematoma/hemorrhage Continues to have pain on the right flexor concerning for right iliopsoas hemorrhage.  Anticoagulation was discontinued yesterday.  Spoke to nephrology who recommended avoiding contrast imaging due to tenuous renal function secondary to previous kidney transplant and new AKI on CKD.  Patient has had CT imaging concerning for hematoma versus bursitis of the right iliopsoas with ultrasound showing prominent vascular structures in the right thigh  concerning for obstruction in the femoral canal.  Physical exam was remarkable for swollen right thigh and lower extremity but skin was warm and dry with good peripheral pulses.  I have been monitoring monitoring the patient's hemoglobin closely.  She has experienced a 1 g drop in hemoglobin over the last 5 days.  Most recent hemoglobin today is 7.5.  I spoke to vascular surgery yesterday they were unconvinced that the scans were positive for hemorrhage.  Patient's enoxaparin was discontinued yesterday evening by pharmacy.  -Dilaudid 1 mg PO PRN q4hrs - Trend CBCs  HFrEF (EF 25%) Patient continues to have good diuresis overnight with improved shortness of breath and swelling. She lost 4 L of urine overnight, which is a significant amount of fluid in a 24 hours period. Likely still needs further diuresis, but may benefit from a slower rate.Cardiology working patient up for ischemic etiologies vs long term HTN.  - Cardiology consulted, appreciaterecommendations - Noninvasive ischemic workup - Lasix 80 mg BID per cardiology.  - Unna boots - Consider low dose Bidil after IV diuresis if BP permits - Consider future SGLT2 inhibitor pending GFR  Atrial fibrillation with RVR CHA2DS2-VAScScore = 4(1 point each for HTN, DM, vascular disease, and gender).  -Continue amiodarone IV infusion30 mg/hr - Monitor on tele  CAD: Prior chest CT 4/21 reviewed and showed multivessel CAD w/ heavily calcified LAD. Not a candidate for LHC currently w/ abnormal renal function  -HF teamwill plan nuclear study to assess for coronary ischemia prior to d/c  - Continue atorvastatin  - hold?blocker initiation while in acute decompensated HF   DVT right upper extremity Held enoxaparin due to concern for iliopsoas hemorrhage. Will repeat Antithrombin III lab to see if patient is truly deficiency or if it was secondary to the heparin she was receiving. - Will re-evaluate her need for anticoagulation daily and  reassess her risk of bleeding.  Acute kidney injury in patient with chronic kidney disease stage IIIs/p transplant - Nephrology consulted, appreciate recs - Prednisone 2.5 mg daily - Mycophenolate 540 mg BID - Tacrolimus 3 mg BID - Checking tacrolimus level - Avoid NSAIDs, morphine, baclofen, oral sodium phosphate and mag citrate-based laxatives, and iodinated IV contrast - Strict I/Os - Daily renal function panel  Elevated alkaline phosphatase, bony lesions Likely due to longstanding secondary hyperparathyroidism given reassuring SPEP.  Secondary hyperparathyroidism PTH up to 728from 324 1 year ago. Low Ca, normal phos, and longstanding elevated PTH, consistent with secondary hyperparathyroidism. -Pending 25-hydroxy vitamin D level - Restart home calcitriol0.5 mcg daily - Increase calcium-vitamin D to 500-500 mg BID  Anemia of chronic disease Iron studies and overall picture consistent with anemia of chronic disease. She will likely benefit from calcium and vitamin D supplementation and possibly need EPO per nephrology's recommendations. - Vitamin B12 levellow, will supplement with 250 mcg B12 daily - 325 mg ferrous sulfate BID  DMII SSI Novolog resistant while inpatient  Rhinorrhea  - Antihistamine prn - Guaifenesin prn    Diet: Heart Healthy IVF: None,None VTE: SCDs Code: Full PT/OT recs: Pending, none. TOC recs: pending   Dispo: Anticipated discharge to TBD    Marianna Payment, Makena, PGY-2 Date 03/19/2020 Time 1:52 PM Pager: [phone] Please contact the on call pager after 5 pm and on weekends at [phone].

## 2020-03-19 NOTE — Progress Notes (Signed)
Patient ID: [name], female   DOB: 12-Jun-1966, 54 y.o.   MRN: [id number]     Advanced Heart Failure Rounding Note  PCP-Cardiologist: No primary care provider on file.   Subjective:    Diuresing well on Lasix 80 mg IV bid, weight down 6 lbs.  Creatinine stable at 2.52.   Probably NSR this morning on amiodarone gtt.   CT pelvis/femur with right distal iliopsoas enlargement, hemorrhage versus bursitis.  Lovenox stopped yesterday by primary service due to concern for hemorrhage.  Breathing improving.    Objective:   Weight Range: 110.5 kg Body mass index is 33.98 kg/m.   Vital Signs:   Temp:  [97.7 F (36.5 C)-98.9 F (37.2 C)] 97.7 F (36.5 C) (09/12 0827) Pulse Rate:  [83-94] 88 (09/12 0827) Resp:  [16-23] 22 (09/12 0827) BP: (123-150)/(81-106) 150/106 (09/12 0827) SpO2:  [98 %-100 %] 100 % (09/12 0827) Weight:  [110.5 kg] 110.5 kg (09/12 0436) Last BM Date: 03/18/20  Weight change: Filed Weights   03/17/20 0319 03/18/20 0453 03/19/20 0436  Weight: 115.3 kg 113.2 kg 110.5 kg    Intake/Output:   Intake/Output Summary (Last 24 hours) at 03/19/2020 1027 Last data filed at 03/19/2020 0948 Gross per 24 hour  Intake 240 ml  Output 5100 ml  Net -4860 ml      Physical Exam    General: NAD Neck: JVP 14 cm, no thyromegaly or thyroid nodule.  Lungs: Clear to auscultation bilaterally with normal respiratory effort. CV: Nondisplaced PMI.  Heart regular S1/S2, no S3/S4, no murmur.  1+ edema to knees.   Abdomen: Soft, nontender, no hepatosplenomegaly, no distention.  Skin: Intact without lesions or rashes.  Neurologic: Alert and oriented x 3.  Psych: Normal affect. Extremities: No clubbing or cyanosis.  HEENT: Normal.    Telemetry   ?NSR with low voltage Ps in 80s (personally reviewed)  EKG   pending.   Labs    CBC Recent Labs    03/18/20 2204 03/19/20 0356  WBC 4.8 4.4  NEUTROABS  --  3.1  HGB 7.6* 7.5*  HCT 24.8* 24.7*  MCV 95.8 95.0  PLT 161  841   Basic Metabolic Panel Recent Labs    03/18/20 0205 03/19/20 0356  NA 141 141  K 3.9 3.7  CL 110 109  CO2 20* 22  GLUCOSE 102* 95  BUN 33* 32*  CREATININE 2.46* 2.52*  CALCIUM 7.4* 7.6*  PHOS 3.8 3.6   Liver Function Tests Recent Labs    03/18/20 0205 03/19/20 0356  ALBUMIN 2.7* 2.6*   No results for input(s): LIPASE, AMYLASE in the last 72 hours. Cardiac Enzymes No results for input(s): CKTOTAL, CKMB, CKMBINDEX, TROPONINI in the last 72 hours.  BNP: BNP (last 3 results) Recent Labs    03/14/20 0920  BNP >4,500.0*    ProBNP (last 3 results) No results for input(s): PROBNP in the last 8760 hours.   D-Dimer No results for input(s): DDIMER in the last 72 hours. Hemoglobin A1C No results for input(s): HGBA1C in the last 72 hours. Fasting Lipid Panel No results for input(s): CHOL, HDL, LDLCALC, TRIG, CHOLHDL, LDLDIRECT in the last 72 hours. Thyroid Function Tests Recent Labs    03/16/20 1525  TSH 1.356    Other results:   Imaging    No results found.   Medications:     Scheduled Medications: . albuterol  2.5 mg Inhalation TID  . atorvastatin  10 mg Oral Daily  . calcitRIOL  0.5 mcg Oral  Daily  . calcium-vitamin D  1 tablet Oral BID  . ferrous sulfate  325 mg Oral BID WC  . furosemide  60 mg Intravenous BID  . insulin aspart  0-20 Units Subcutaneous TID WC  . isosorbide-hydrALAZINE  1 tablet Oral TID  . mycophenolate  540 mg Oral BID  . pantoprazole  40 mg Oral Daily  . predniSONE  2.5 mg Oral Q breakfast  . sodium chloride flush  3 mL Intravenous Q12H  . tacrolimus  3 mg Oral BID  . vitamin B-12  1,000 mcg Oral Daily    Infusions: . amiodarone 30 mg/hr (03/18/20 1314)    PRN Medications: acetaminophen **OR** acetaminophen, albuterol, guaiFENesin-dextromethorphan    Patient Profile   54 y/o AAF w/ h/o HTN, T2DM, tobacco abuse, CAD (extensive CAD on prior chest CT) and CKD s/p rt renal transplant in 2011 at [facility]. She is  followed by [facility]. She has chronic LBBB but no other prior cardiac history.  She has a +FH of CHF (sister 61).   Assessment/Plan   1. Acute Systolic Heart Failure: - NYHA Class IIIb w/ marked fluid overload. BNP >4,500  - New diagnosis. EF 25-30 w/ diffuse hypokinesis. RV ok. Severe concentric LVH. No prior studies for comparison.  - suspect hypertensive cardiomyopathy 2/2 long standing HTN - also ?ischemic cardiomyopathy. Chest CT 4/21 shows multivessel CAD w/ heavily calcified LAD. Plan noninvasive ischemic w/u  Myoview. Avoid cath w/ CKD.  - ? Cardiac Amyloid. Protein Electrophoresis - no M Spike.   - ? Tachy-Mediated.  - Still volume overloaded, creatinine stable with diuresis at 2.52 so will continue Lasix but decreased dose to 60 mg IV bid given vigorous response yesterday.  - no ARB/ARNi, spironolactone or digoxin w/ CKD - Stable BP, can increase Bidil to 1 tab tid.  - no ? blocker yet w/ acute decompensation - consider future SGLT2i w/ concomitant CKD and DM, pending GFR (will avoid for now given GFR <25)    2. Acute on Chronic Kidney Disease: - Nephrology following.  - Possible cardiorenal syndrome.  - s/p rt renal transplant in 2011 at [facility].  - on prednisone, Myfortic and tacrolimus  - Creatinine stable at 2.52 today, follow closely with diuresis.  - 3. Atrial Fibrillation:  - New. Went into rapid AF 140s on 9/8 - Rhythm difficult, ?NSR with low voltage Ps versus junctional versus AF.  - ECG today.  - Lovenox stopped yesterday by primary team with concern for iliopsoas hemorrhage.   4. Presumed CAP: - admit CXR suggestive of CHF w/ interstial edema as well as findings concerning for superimposed infection. PCT 2.65. Lactic acid 1.9. AF. Blood cultures negative - Now off abx.   5. CAD: - prior chest CT 4/21 reviewed and showed multivessel CAD w/ heavily calcified LAD - not a candidate for LHC currently w/ abnormal renal function  - will plan nuclear study to assess  for coronary ischemia prior to d/c  - continue statin, increased atorvastatin to 40 mg night to target LDL < 70  - no ASA w/ chronic anemia and need for anticoagulation for DVT/AF - hold ? blocker initiation while in acute decompensated HF  - smoking cessation imperative   6. RUE DVT: - no recent trauma. Concern for malignancy.  - V/Q scan negative.  - lower extremity doppler negative.  - Now off Lovenox because of concern for iliopsoas hemorrhage.   7. Anemia of Chronic Disease/ Iron Deficiency Anemia: - 2/2 CKD - Hgb 7.5, mildly lower.  No overt bleeding.  - Fe low. Sats Ratio 9% - Allergic to IV iron - On ferrous sulfate 325 BID   8. HTN: - Controlled.   9. T2DM: - well controlled, Hgb A1c 5.0  - GFR borderline for SGLT2i   10. Tobacco Abuse:  - smokes 1/2 ppm  - smoking cessation advised   11. HLD - known CAD based on Chest CT - recent outpatient LP 6/21 showed elevated LDL at 84 mg/dL - increase home atorvastatin to 40 mg nightly - needs repeat FLP and HFTs in 6-8 weeks   12. Elevated Alk Phos/ Abnormal Bone Scans  - Alk Phos 1,300 on admit  - Had whole body bone scan 3/21 w/ diffuse uptake throughout the axial and appendicular skeleton suspicious for metastatic disease - undergoing w/u for Multiple Myeloma => SPEP negative.   13. ?Iliopsoas hemorrhage - CT femur/pelvis with right distal iliopsoas enlargement => hemorrhage versus bursitis.  Lovenox stopped.  Would ideally be on anticoagulation for atrial fibrillation and RUE DVT. Would recommend reviewing the CT with radiology and vascular to determine if it would be safe to resume anticoagulation.   Length of Stay: [place], MD  03/19/2020, 10:27 AM  Advanced Heart Failure Team Pager [phone] (M-F; 7a - 4p)  Please contact [facility] for night-coverage after hours (4p -7a ) and weekends on [email or web address]

## 2020-03-19 NOTE — Progress Notes (Signed)
[facility] Progress Note    Assessment/ Plan:    74F transplant pt of Dr. Elissa Hefty coming in with vol overload, new CHF, Afib, RUE DVT and ? Iliopsoas hemorrhage vs bursitis  Assessment/Plan: 1.  Acute kidney injury in patient with chronic kidney disease stage III T: Baseline Cr is 1.7, up to 2.3.  Suspect the current acute kidney injury may be hemodynamically mediated in the setting of decreased effective arterial blood volume/interstitial fluid and ongoing diuresis possibly cardiorenal syndrome as well.  Continue pred 2.5 mg daily, Myfortic 540 mg BID, tacrolimus 3 mg BID (confirmed by pharmacy).  Level pending.   2.  Acute hypoxic respiratory failure/volume overload/acute exacerbation of congestive heart failure:  Improved on IV Lasix, will decrease to 60 mg IV BID given robust diuresis.  On empiric azithro/ CTX.   3.  Anemia of chronic kidney disease: Significantly low hemoglobin and hematocrit noted at today's labs, iron studies with 9% tsat, allergic to IV iron.  Will give PO BID.    4.  Hypertension: Elevated blood pressure noted at this time, monitor with diuresis.  5.  Type 2 diabetes mellitus: Resume management with sliding scale insulin per primary service.  6.  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  Evaluation for plasma cell dyscrasia undertaken by primary service (SPEP negative) but other malignancy may be playing a role.   7.  RLE pain/ swelling- venous doppler negative, CT scan 9/10 femur and pelvis showing R distal iliopsoas muscle enlargement, hemorrhage vs bursitis.  Would avoid contrasted scans.  She is on [facility] with Lovenox due to #10- has been stopped for now, see further discussion below  8.  New onset heart failure: TTE EF 25-30%.  Heart failure following. Added BiDil  9.  Afib: on amio gtt  10.  RUE DVT: hep gtt--> lovenox on hold for possible iliopsoas  hemorrhage, LMWH level pending, last dose of Lovenox per MAR was 9/11 with 120 mg.  She has 2 indications to be on anticoagulation- RUE DVT and Afib.  Would recommend d/w vascular/ radiology if we can differentiate through noncontrasted imaging if this is bursitis vs hemorrhage. Agree with CBC checks       Subjective:    Improving with breathing.  Remains off Lovenox per primary, had one dose of 120 mg yesterday 9/11 and then d/c'd.  4.3L UOP.     Objective:   BP 120/72 (BP Location: Left Arm)   Pulse 80   Temp 98.6 F (37 C) (Oral)   Resp (!) 21   Ht 5\' 11"  (1.803 m)   Wt 110.5 kg   LMP 11/05/2012   SpO2 100%   BMI 33.98 kg/m   Intake/Output Summary (Last 24 hours) at 03/19/2020 1137 Last data filed at 03/19/2020 0948 Gross per 24 hour  Intake 240 ml  Output 4350 ml  Net -4110 ml   Weight change: -2.704 kg  Physical Exam: Gen: sitting up in bed CVS: RRR  Resp: breathing better- still some bibasilar crackles/ wheezing Abd: nondistended Ext: 2+ edema bilaterally LEs R >L, R arm swelling, no L arm swelling  Imaging: CT PELVIS WO CONTRAST  Result Date: 03/17/2020 CLINICAL DATA:  Right leg pain increasing with movement and coughing. EXAM: CT PELVIS WITHOUT CONTRAST TECHNIQUE: Multidetector CT imaging of the pelvis was performed following the standard protocol without intravenous contrast. COMPARISON:  Abdominopelvic CT 10/21/2019. Whole-body bone scan 10/01/2019. FINDINGS: Urinary Tract: Transplant kidney noted in  the right iliac fossa, grossly stable. The native kidneys are only partially imaged. No evidence of collecting system dilatation or abnormality of the urinary bladder. Bowel: No bowel wall thickening, distention or surrounding inflammation identified within the pelvis. Vascular/Lymphatic: No enlarged pelvic lymph nodes identified. Aortoiliac atherosclerosis. Vascular patency not addressed without contrast. Reproductive: The uterus and air is appear normal.  No adnexal mass.  Other: Progressive anasarca with diffuse soft tissue edema. No ascites or focal extraluminal fluid collection in the pelvis. Musculoskeletal: No acute osseous findings. 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 Scattered lucent lesions in the pelvis are unchanged from 06/01/2018, consistent with benign findings. No pathologic fracture. Stable chronic degenerative disc disease at L5-S1. New asymmetric enlargement of the distal right iliopsoas muscle and tendon which could reflect hemorrhage or bursitis. IMPRESSION: 1. New asymmetric enlargement of the distal right iliopsoas muscle and tendon which could reflect hemorrhage or bursitis. 2. Progressive anasarca with diffuse soft tissue edema. 3. Stable appearance of transplant kidney in the right iliac fossa. 4. Aortic Atherosclerosis (ICD10-I70.0). Electronically Signed   By: Richardean Sale M.D.   On: 03/17/2020 16:55   NM Pulmonary Perfusion  Result Date: 03/17/2020 CLINICAL DATA:  54 year old female with shortness of breath for 1 day, cough x4 days. Respiratory failure. Tachycardia. EXAM: NUCLEAR MEDICINE PERFUSION LUNG SCAN TECHNIQUE: Perfusion images were obtained in multiple projections after intravenous injection of radiopharmaceutical. Ventilation scans intentionally deferred if perfusion scan and chest x-ray adequate for interpretation during COVID 19 epidemic. RADIOPHARMACEUTICALS:  4.2 mCi Tc-15m MAA IV COMPARISON:  Chest radiographs 03/16/2020 and earlier. FINDINGS: Expected photopenia related to cardiomegaly. Otherwise homogeneous appearing radiotracer activity throughout both lungs. No convincing perfusion defect. IMPRESSION: Cardiomegaly.  No perfusion defect to suggest pulmonary embolus. Electronically Signed   By: Genevie Ann M.D.   On: 03/17/2020 13:32   CT FEMUR RIGHT WO CONTRAST  Result Date: 03/17/2020 CLINICAL DATA:  Right leg pain worsening with movement and coughing. History of diabetes and renal failure. EXAM: CT  OF THE LOWER RIGHT EXTREMITY WITHOUT CONTRAST TECHNIQUE: Multidetector CT imaging of the right lower extremity was performed according to the standard protocol. COMPARISON:  Pelvic CT today and 10/21/2019. FINDINGS: Bones/Joint/Cartilage No evidence of acute fracture, dislocation or femoral head avascular necrosis. The right hip joint space is preserved. Chronic lucent lesion in the right ischium is unchanged from 2019, consistent with a benign finding. There is joint space narrowing medially at the right knee and a small knee joint effusion. Ligaments Suboptimally assessed by CT. Muscles and Tendons Musculotendinous detail limited by generalized anasarca and body habitus. There is asymmetric enlargement of the distal right iliopsoas muscle and tendon, better seen on concurrent pelvic CT, which may reflect hemorrhage or bursitis. No other focal muscular abnormalities are identified. The extensor mechanism is intact at the knee. Soft tissues Generalized edema throughout the subcutaneous and deep soft tissues of the right thigh. No focal fluid collection, foreign body or soft tissue emphysema identified. Diffuse atherosclerotic calcification of the right femoral artery. IMPRESSION: 1. No acute or significant osseous findings. 2. Generalized soft tissue edema throughout the right thigh, nonspecific, but may reflect anasarca or cellulitis. No focal fluid collection, foreign body or soft tissue emphysema identified. 3. Asymmetric enlargement of the distal right iliopsoas muscle and tendon, better seen on concurrent pelvic CT, which may reflect hemorrhage or bursitis. Electronically Signed   By: Richardean Sale M.D.   On: 03/17/2020 16:55   [place] RT LOWER EXTREM LTD SOFT TISSUE NON VASCULAR  Result Date: 03/18/2020 CLINICAL DATA:  Right thigh pain and swelling EXAM: ULTRASOUND right LOWER EXTREMITY LIMITED TECHNIQUE: Ultrasound examination of the lower extremity soft tissues was performed in the area of clinical concern.  COMPARISON:  CT 03/17/2020 FINDINGS: Joint Space: Not included Muscles: Included muscles are unremarkable. Tendons: Not visualized Other Soft Tissue Structures: Diffuse subcutaneous soft tissue edema. Prominent vascular structures in the right groin. No discrete mass or collection is identified. IMPRESSION: Diffuse subcutaneous soft tissue edema. Prominent vascular structures in the right groin. No discrete mass or collection is identified. Electronically Signed   By: Lucienne Capers M.D.   On: 03/18/2020 01:07    Labs: BMET Recent Labs  Lab 03/14/20 0920 03/14/20 1529 03/15/20 0519 03/16/20 0652 03/17/20 0630 03/18/20 0205 03/19/20 0356  NA 141  --  141 140 140 141 141  K 4.1  --  3.8 3.6 3.3* 3.9 3.7  CL 113*  --  112* 110 109 110 109  CO2 17*  --  21* 19* 20* 20* 22  GLUCOSE 145*  --  113* 149* 100* 102* 95  BUN 26*  --  26* 30* 32* 33* 32*  CREATININE 2.23*  --  2.37* 2.35* 2.52* 2.46* 2.52*  CALCIUM 8.1*  --  8.0* 7.6* 7.4* 7.4* 7.6*  PHOS  --  3.6  --   --  4.0 3.8 3.6   CBC Recent Labs  Lab 03/14/20 0920 03/15/20 0519 03/17/20 0630 03/18/20 0205 03/18/20 2204 03/19/20 0356  WBC 8.8   < > 3.9* 4.2 4.8 4.4  NEUTROABS 7.8*  --   --   --   --  3.1  HGB 8.8*   < > 7.8* 7.7* 7.6* 7.5*  HCT 30.2*   < > 25.7* 25.5* 24.8* 24.7*  MCV 101.3*   < > 98.5 98.1 95.8 95.0  PLT 178   < > 152 177 161 178   < > = values in this interval not displayed.    Medications:    . albuterol  2.5 mg Inhalation TID  . atorvastatin  10 mg Oral Daily  . calcitRIOL  0.5 mcg Oral Daily  . calcium-vitamin D  1 tablet Oral BID  . ferrous sulfate  325 mg Oral BID WC  . furosemide  60 mg Intravenous BID  . insulin aspart  0-20 Units Subcutaneous TID WC  . isosorbide-hydrALAZINE  1 tablet Oral TID  . mycophenolate  540 mg Oral BID  . pantoprazole  40 mg Oral Daily  . predniSONE  2.5 mg Oral Q breakfast  . sodium chloride flush  3 mL Intravenous Q12H  . tacrolimus  3 mg Oral BID  . vitamin  B-12  1,000 mcg Oral Daily     Madelon Lips MD 03/19/2020, 11:37 AM

## 2020-03-20 DIAGNOSIS — D638 Anemia in other chronic diseases classified elsewhere: Secondary | ICD-10-CM | POA: Diagnosis present

## 2020-03-20 DIAGNOSIS — I5020 Unspecified systolic (congestive) heart failure: Secondary | ICD-10-CM

## 2020-03-20 LAB — RENAL FUNCTION PANEL
Albumin: 2.4 g/dL — ABNORMAL LOW (ref 3.5–5.0)
Anion gap: 13 (ref 5–15)
BUN: 33 mg/dL — ABNORMAL HIGH (ref 6–20)
CO2: 19 mmol/L — ABNORMAL LOW (ref 22–32)
Calcium: 7.3 mg/dL — ABNORMAL LOW (ref 8.9–10.3)
Chloride: 107 mmol/L (ref 98–111)
Creatinine, Ser: 2.63 mg/dL — ABNORMAL HIGH (ref 0.44–1.00)
GFR calc Af Amer: 23 mL/min — ABNORMAL LOW (ref >60)
GFR calc non Af Amer: 20 mL/min — ABNORMAL LOW (ref >60)
Glucose, Bld: 153 mg/dL — ABNORMAL HIGH (ref 70–99)
Phosphorus: 3 mg/dL (ref 2.5–4.6)
Potassium: 3.9 mmol/L (ref 3.5–5.1)
Sodium: 139 mmol/L (ref 135–145)

## 2020-03-20 LAB — CBC WITH DIFFERENTIAL/PLATELET
Abs Immature Granulocytes: 0.04 10*3/uL (ref 0.00–0.07)
Basophils Absolute: 0 10*3/uL (ref 0.0–0.1)
Basophils Relative: 0 %
Eosinophils Absolute: 0.1 10*3/uL (ref 0.0–0.5)
Eosinophils Relative: 2 %
HCT: 24.3 % — ABNORMAL LOW (ref 36.0–46.0)
Hemoglobin: 8 g/dL — ABNORMAL LOW (ref 12.0–15.0)
Immature Granulocytes: 1 %
Lymphocytes Relative: 16 %
Lymphs Abs: 0.8 10*3/uL (ref 0.7–4.0)
MCH: 30.4 pg (ref 26.0–34.0)
MCHC: 32.9 g/dL (ref 30.0–36.0)
MCV: 92.4 fL (ref 80.0–100.0)
Monocytes Absolute: 0.4 10*3/uL (ref 0.1–1.0)
Monocytes Relative: 9 %
Neutro Abs: 3.6 10*3/uL (ref 1.7–7.7)
Neutrophils Relative %: 72 %
Platelets: 192 10*3/uL (ref 150–400)
RBC: 2.63 MIL/uL — ABNORMAL LOW (ref 3.87–5.11)
RDW: 15.3 % (ref 11.5–15.5)
WBC: 5 10*3/uL (ref 4.0–10.5)
nRBC: 0 % (ref 0.0–0.2)

## 2020-03-20 LAB — GLUCOSE, CAPILLARY
Glucose-Capillary: 99 mg/dL (ref 70–99)
Glucose-Capillary: 101 mg/dL — ABNORMAL HIGH (ref 70–99)
Glucose-Capillary: 153 mg/dL — ABNORMAL HIGH (ref 70–99)
Glucose-Capillary: 137 mg/dL — ABNORMAL HIGH (ref 70–99)

## 2020-03-20 LAB — ABO/RH: ABO/RH(D): A POS

## 2020-03-20 MED ORDER — AMIODARONE HCL 200 MG PO TABS
200.0000 mg | ORAL_TABLET | Freq: Two times a day (BID) | ORAL | Status: DC
  Administered 2020-03-20 – 2020-03-22 (×5): 200 mg via ORAL
  Filled 2020-03-20 (×5): qty 1

## 2020-03-20 MED ORDER — HYDROMORPHONE HCL 2 MG PO TABS
1.0000 mg | ORAL_TABLET | ORAL | Status: DC | PRN
  Administered 2020-03-22: 1 mg via ORAL
  Filled 2020-03-20: qty 1

## 2020-03-20 MED ORDER — TACROLIMUS 1 MG PO CAPS
3.0000 mg | ORAL_CAPSULE | Freq: Two times a day (BID) | ORAL | Status: DC
  Administered 2020-03-20 – 2020-03-24 (×8): 3 mg via ORAL
  Filled 2020-03-20 (×8): qty 3

## 2020-03-20 NOTE — Progress Notes (Signed)
Orthopedic Tech Progress Note Patient Details:  [name] 1965-10-20 [id number]  Ortho Devices Type of Ortho Device: Louretta Parma boot Ortho Device/Splint Location: Bilateral Lower Extremity Ortho Device/Splint Interventions: Ordered, Application   Post Interventions Patient Tolerated: Well Instructions Provided: Care of device   Tammy Sours 03/20/2020, 12:02 PM

## 2020-03-20 NOTE — Progress Notes (Addendum)
Patient ID: [name], female   DOB: Feb 17, 1966, 54 y.o.   MRN: [id number]     Advanced Heart Failure Rounding Note  PCP-Cardiologist: No primary care provider on file.   Subjective:    Diuresing well on Lasix 60  mg IV bid, weight down another 5 pounds.  Creatinine stable at 2.6    Probably NSR this morning on amiodarone gtt.   CT pelvis/femur with right distal iliopsoas enlargement, hemorrhage versus bursitis.  Lovenox stopped yesterday by primary service due to concern for hemorrhage.   Feeling better. Pain decreased in right thigh.   Objective:   Weight Range: 108.2 kg Body mass index is 33.28 kg/m.   Vital Signs:   Temp:  [98.6 F (37 C)-99.1 F (37.3 C)] 98.6 F (37 C) (09/13 0459) Pulse Rate:  [73-85] 75 (09/13 0459) Resp:  [17-22] 19 (09/13 0459) BP: (120-141)/(60-81) 141/71 (09/13 0459) SpO2:  [100 %] 100 % (09/13 0735) FiO2 (%):  [99 %] 99 % (09/12 1326) Weight:  [108.2 kg] 108.2 kg (09/13 0459) Last BM Date: 03/18/20  Weight change: Filed Weights   03/18/20 0453 03/19/20 0436 03/20/20 0459  Weight: 113.2 kg 110.5 kg 108.2 kg    Intake/Output:   Intake/Output Summary (Last 24 hours) at 03/20/2020 1014 Last data filed at 03/20/2020 1000 Gross per 24 hour  Intake 240 ml  Output 3300 ml  Net -3060 ml      Physical Exam    General:  No resp difficulty HEENT: normal Neck: supple. JVP 9-10 . Carotids 2+ bilat; no bruits. No lymphadenopathy or thryomegaly appreciated. Cor: PMI nondisplaced. Regular rate & rhythm. No rubs, gallops or murmurs. Lungs: clear on room air.  Abdomen: soft, nontender, nondistended. No hepatosplenomegaly. No bruits or masses. Good bowel sounds. Extremities: no cyanosis, clubbing, rash, R and LLE 1+ edema unna boots.  Neuro: alert & orientedx3, cranial nerves grossly intact. moves all 4 extremities w/o difficulty. Affect pleasant   Telemetry   SR 80s   EKG   pending.   Labs    CBC Recent Labs    03/19/20 0356  03/19/20 0356 03/19/20 1925 03/19/20 2334  WBC 4.4   < > 4.9 5.0  NEUTROABS 3.1  --   --  3.6  HGB 7.5*   < > 7.0* 8.0*  HCT 24.7*   < > 22.7* 24.3*  MCV 95.0   < > 94.6 92.4  PLT 178   < > 201 192   < > = values in this interval not displayed.   Basic Metabolic Panel Recent Labs    64/84/72 0356 03/19/20 2334  NA 141 139  K 3.7 3.9  CL 109 107  CO2 22 19*  GLUCOSE 95 153*  BUN 32* 33*  CREATININE 2.52* 2.63*  CALCIUM 7.6* 7.3*  PHOS 3.6 3.0   Liver Function Tests Recent Labs    03/19/20 0356 03/19/20 2334  ALBUMIN 2.6* 2.4*   No results for input(s): LIPASE, AMYLASE in the last 72 hours. Cardiac Enzymes No results for input(s): CKTOTAL, CKMB, CKMBINDEX, TROPONINI in the last 72 hours.  BNP: BNP (last 3 results) Recent Labs    03/14/20 0920  BNP >4,500.0*    ProBNP (last 3 results) No results for input(s): PROBNP in the last 8760 hours.   D-Dimer No results for input(s): DDIMER in the last 72 hours. Hemoglobin A1C No results for input(s): HGBA1C in the last 72 hours. Fasting Lipid Panel No results for input(s): CHOL, HDL, LDLCALC, TRIG, CHOLHDL, LDLDIRECT  in the last 72 hours. Thyroid Function Tests No results for input(s): TSH, T4TOTAL, T3FREE, THYROIDAB in the last 72 hours.  Invalid input(s): FREET3  Other results:   Imaging    No results found.   Medications:     Scheduled Medications: . atorvastatin  10 mg Oral Daily  . calcitRIOL  0.5 mcg Oral Daily  . calcium-vitamin D  1 tablet Oral BID  . ferrous sulfate  325 mg Oral BID WC  . furosemide  60 mg Intravenous BID  . insulin aspart  0-20 Units Subcutaneous TID WC  . isosorbide-hydrALAZINE  1 tablet Oral TID  . loratadine  10 mg Oral Daily  . mycophenolate  540 mg Oral BID  . pantoprazole  40 mg Oral Daily  . predniSONE  2.5 mg Oral Q breakfast  . sodium chloride flush  3 mL Intravenous Q12H  . tacrolimus  3 mg Oral BID  . vitamin B-12  1,000 mcg Oral Daily    Infusions: .  amiodarone 30 mg/hr (03/19/20 1656)    PRN Medications: acetaminophen **OR** acetaminophen, albuterol, guaiFENesin, guaiFENesin-dextromethorphan, HYDROmorphone    Patient Profile   54 y/o AAF w/ h/o HTN, T2DM, tobacco abuse, CAD (extensive CAD on prior chest CT) and CKD s/p rt renal transplant in 2011 at [facility]. She is followed by [facility]. She has chronic LBBB but no other prior cardiac history.  She has a +FH of CHF (sister 51).   Assessment/Plan   1. Acute Systolic Heart Failure: - NYHA Class IIIb w/ marked fluid overload. BNP >4,500  - New diagnosis. EF 25-30 w/ diffuse hypokinesis. RV ok. Severe concentric LVH. No prior studies for comparison.  - suspect hypertensive cardiomyopathy 2/2 long standing HTN - also ?ischemic cardiomyopathy. Chest CT 4/21 shows multivessel CAD w/ heavily calcified LAD. Plan noninvasive ischemic w/u  Myoview. Avoid cath w/ CKD.  - ? Cardiac Amyloid. Protein Electrophoresis - no M Spike.   - ? Tachy-Mediated.  - Volume status improving with IV lasix. Brisk diuresis noted. Continue  60 mg IV bid. Needs another day of IV lasix.  - no ARB/ARNi, spironolactone or digoxin w/ CKD - Stable BP, continue bidil to 1 tab tid.  - no ? blocker yet w/ acute decompensation - consider future SGLT2i w/ concomitant CKD and DM, pending GFR (will avoid for now given GFR <25)    2. Acute on Chronic Kidney Disease: - Nephrology following.  - Possible cardiorenal syndrome.  - s/p rt renal transplant in 2011 at [facility].  - on prednisone, Myfortic and tacrolimus  - Creatinine stable at 2.6 today   - 3. Atrial Fibrillation:  - New. Went into rapid AF 140s on 9/8 - Stop IV amio. Start amio 200 mg twice a day.  - Lovenox stopped by primary team with concern for iliopsoas hemorrhage.   4. Presumed CAP: - admit CXR suggestive of CHF w/ interstial edema as well as findings concerning for superimposed infection. PCT 2.65. Lactic acid 1.9. AF. Blood cultures negative - Now off  abx.   5. CAD: - prior chest CT 4/21 reviewed and showed multivessel CAD w/ heavily calcified LAD - not a candidate for LHC currently w/ abnormal renal function  - will plan nuclear study to assess for coronary ischemia prior to d/c  - continue statin, increased atorvastatin to 40 mg night to target LDL < 70  - no ASA w/ chronic anemia and need for anticoagulation for DVT/AF - hold ? blocker initiation while in acute decompensated HF  -  smoking cessation imperative   6. RUE DVT: - no recent trauma. Concern for malignancy.  - V/Q scan negative.  - lower extremity doppler negative.  - Now off Lovenox because of concern for iliopsoas hemorrhage.   7. Anemia of Chronic Disease/ Iron Deficiency Anemia: - 2/2 CKD - Hgb 8 . - Fe low. Sats Ratio 9% - Allergic to IV iron - On ferrous sulfate 325 BID   8. HTN: - Controlled.   9. T2DM: - well controlled, Hgb A1c 5.0  - GFR borderline for SGLT2i   10. Tobacco Abuse:  - smokes 1/2 ppm  - smoking cessation advised   11. HLD - known CAD based on Chest CT - recent outpatient LP 6/21 showed elevated LDL at 84 mg/dL - increase home atorvastatin to 40 mg nightly - needs repeat FLP and HFTs in 6-8 weeks   12. Elevated Alk Phos/ Abnormal Bone Scans  - Alk Phos 1,300 on admit  - Had whole body bone scan 3/21 w/ diffuse uptake throughout the axial and appendicular skeleton suspicious for metastatic disease - undergoing w/u for Multiple Myeloma => SPEP negative.   13. ?Iliopsoas hemorrhage - CT femur/pelvis with right distal iliopsoas enlargement => hemorrhage versus bursitis.  Lovenox stopped.   - Per Dr Aundra Dubin- Would ideally be on anticoagulation for atrial fibrillation and RUE DVT. Would recommend reviewing the CT with radiology and vascular to determine if it would be safe to resume anticoagulation.   Length of Stay: 6  Amy Clegg, NP  03/20/2020, 10:14 AM  Advanced Heart Failure Team Pager [phone] (M-F; [place])  Please  contact [facility] for night-coverage after hours (4p -7a ) and weekends on [email or web address]   Patient seen and examined with the above-signed Advanced Practice Provider and/or Housestaff. I personally reviewed laboratory data, imaging studies and relevant notes. I independently examined the patient and formulated the important aspects of the plan. I have edited the note to reflect any of my changes or salient points. I have personally discussed the plan with the patient and/or family.  Looks and feels much better. Weight down about 15 pounds. Back in NSR (with PACs) on amio. Creatinine climbing slightly. Remains off AC due to R iliopsoas bleed.   General:  Sitting up in bed No resp difficulty HEENT: normal Neck: supple. jvp 9-10. Carotids 2+ bilat; no bruits. No lymphadenopathy or thryomegaly appreciated. Cor: PMI nondisplaced. Mildly irregular rate & rhythm. No rubs, gallops or murmurs. Lungs: clear Abdomen: soft, nontender, nondistended. No hepatosplenomegaly. No bruits or masses. Good bowel sounds. Extremities: no cyanosis, clubbing, rash, tr edema Neuro: alert & orientedx3, cranial nerves grossly intact. moves all 4 extremities w/o difficulty. Affect pleasant  She is improving. Agree with one more day of IV diuresis if ok with Renal. Now back in NSR on IV amio. Can switch to po tomorrow. Suspect we should keep her off of AC for now with recent bleed. Will get Lexiscan Myoview to assess for underlying ischemic CM.   Can consider RHC after full diuresis if this would assist in her management. Will d/w Renal.   Glori Bickers, MD  2:41 PM

## 2020-03-20 NOTE — Care Management Important Message (Signed)
Important Message  Patient Details  Name: [name] MRN: [id number] Date of Birth: 04-04-1966   Medicare Important Message Given:  Yes     Shelda Altes 03/20/2020, 10:30 AM

## 2020-03-20 NOTE — Progress Notes (Signed)
Subjective: Ms. Lokken is overall feeling better than she did yesterday. Her shortness of breath has improved such that she no longer requires supplemental oxygen and she has not required dilaudid since Saturday but reports only bearable thigh pain that responds to Tylenol and elevated positioning of her right leg. She still has a cough and her nose is running quite a bit but she does not want to add new medications for congestion at this time.  Objective:  Vital signs in last 24 hours: Vitals:   03/19/20 1922 03/19/20 2009 03/19/20 2342 03/20/20 0459  BP:  126/60 127/78 (!) 141/71  Pulse:  85 73 75  Resp:  17 (!) 22 19  Temp:  99.1 F (37.3 C) 98.8 F (37.1 C) 98.6 F (37 C)  TempSrc:  Oral Oral Oral  SpO2: 100% 100% 100% 100%  Weight:    108.2 kg  Height:      Weight 108 kg from 114, has lost about 6 kg Cumulative out 12 L from admission  Weight change: -2.268 kg  Intake/Output Summary (Last 24 hours) at 03/20/2020 5449 Last data filed at 03/19/2020 2342 Gross per 24 hour  Intake 480 ml  Output 3600 ml  Net -3120 ml   Labs: Cr 2.63 from 2.52, Ca 7.3 from 7.6 (corrected is 8.6), Alb 2.4 from 2.6, Hgb 8.0 from 7.0 same day, ATIII 72% from 57%  Physical Exam Constitutional:      Appearance: She is well-developed. She is obese.  HENT:     Head: Normocephalic and atraumatic.  Cardiovascular:     Rate and Rhythm: Normal rate.     Pulses: Normal pulses.  Pulmonary:     Effort: Pulmonary effort is normal.     Breath sounds: Normal breath sounds.  Abdominal:     General: Abdomen is flat. There is no distension.  Musculoskeletal:        General: Swelling (dependent swelling around hips) and tenderness (right hip) present.     Cervical back: Normal range of motion.     Right lower leg: Edema present.     Left lower leg: Edema present.  Skin:    General: Skin is warm and dry.     Coloration: Skin is not pale.  Neurological:     General: No focal deficit present.      Mental Status: She is alert and oriented to person, place, and time.   Assessment/Plan: Principal Problem:   Heart failure with reduced ejection fraction (HCC) Active Problems:   Type II diabetes mellitus, well controlled ([place])   S/p cadaver renal transplant   Respiratory failure ([place])  Patient Summary: [name] is a 54 year old woman with a PMH significant for DMII, GERD, hypertension, renal transplant,CKD, and tobacco usewho presentedwith SOB, orthopnea, and anasarca, found to have new diagnoses of HFrEF,atrial fibrillation with RVR, and R axillary vein DVT.  Thigh pain--possible R iliopsoas hematoma/hemorrhage The pain in her right thigh is improving. Given its overall history of worsening on anticoagulants and improving with cessation, the pain and swelling on CT are concerning for right iliopsoas hemorrhage. Her quick rise in Hgb (8.0 from 7.0 only 4 hours prior yesterday) seems possibly artifactual, but continued trending of her CBC will bear out if this is the case. Improvement in her Hgb with cessation of the enoxaparin could reflect cessation of her iliopsoas hemorrhage and increasing hemoconcentration in the setting of diuresis. - Radiology recommends MR for further imaging of iliopsoas - Holding enoxaparin for now but will  restart if evidence of PE or life-threatening clots arise -Dilaudid 1 mg PO PRNq4hrs for significant pain - Trend CBCs  HFrEF (EF 25%) Patient continues to have good diuresis overnight with improved shortness of breath and swelling. Likely still needs further diuresis, but may benefit from a slower rate. Cardiology working patient up for ischemic etiologies vs long term HTN.  - Noninvasive ischemic workup per cardiology - Lasix decreased to 60 mg BID per nephrology.  - Unna boots - Start Bidil 1 tab TID - Consider future SGLT2 inhibitor pending GFR  Atrial fibrillation with RVR CHA2DS2-VAScScore = 4(1 point each for HTN, DM, vascular  disease, and gender).  -Continue amiodarone IV infusion30 mg/hr per cardiology, may consider switching to an oral alternative - Monitor on tele  CAD: Prior chest CT 4/21 showed multivessel CAD w/ heavily calcified LAD. Not a candidate for LHC currently w/ abnormal renal function  -HF teamwill plan nuclear study to assess for coronary ischemia prior to d/c  -Continue atorvastatin  - hold?blocker initiation while in acute decompensated HF   DVT right upper extremity Held enoxaparin due to concern for iliopsoas hemorrhage. 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 - Will re-evaluate her need for anticoagulation daily and reassess her risk of bleeding.  Acute kidney injury in patient with chronic kidney disease stage IIIs/p transplant Nephrology consulted, appreciate recommendations. - Prednisone 2.5 mg daily - Mycophenolate 540 mg BID - Tacrolimus 3 mg BID - Checking tacrolimus level regularly - Avoid NSAIDs, morphine, baclofen, oral sodium phosphate and mag citrate-based laxatives, and iodinated IV contrast - Strict I/Os - Dailyrenal function panel  Elevated alkaline phosphatase, bony lesions Likely due to longstanding secondary hyperparathyroidism given reassuring SPEP.  Secondary hyperparathyroidism PTH up to 71from 324 1 year ago. Low Ca, normal phos, and longstanding elevated PTH, consistent with secondary hyperparathyroidism. -Pending 25-hydroxy vitamin D level - Calcitriol0.5 mcg daily -Calcium-vitamin D 500-200 mgBID  Anemia of chronic disease Iron studies and overall picture consistent with anemia of chronic disease. She will likely benefit from calcium and vitamin D supplementation and possibly need EPO per nephrology's recommendations. - Vitamin B12  levellow, supplementing with 1000 mcg B12 daily - 325 mg ferrous sulfate BID  DMII SSI Novolog resistant while inpatient  Rhinorrhea  - Loratadine 10 mg daily - Guaifenesin-dextromethorphan 100-10mg  prn  Diet: Heart Healthy IVF: None,None VTE: SCDs Code: Full PT/OT recs: Pending, none. TOC recs: pending  Dispo: Anticipated discharge to TBD    LOS: 6 days   Mardella Layman, Medical Student 03/20/2020, 6:29 AM

## 2020-03-20 NOTE — Progress Notes (Signed)
[facility] Progress Note    Assessment/ Plan:    37F transplant pt of Dr. Elissa Hefty coming in with vol overload, new CHF, Afib, RUE DVT and ? Iliopsoas hemorrhage vs bursitis  Assessment/Plan: 1.  Acute kidney injury in patient with chronic kidney disease stage III T: Baseline Cr is 1.7.  Suspect the current acute kidney injury may be hemodynamically mediated in the setting of decreased effective arterial blood volume/interstitial fluid and ongoing diuresis possibly cardiorenal syndrome as well.  Continue pred 2.5 mg daily, Myfortic 540 mg BID, tacrolimus 3 mg BID (confirmed by pharmacy).  Level pending.  Changed tacrolimus to 8 am and 8 pm dosing - note on lasix as below  2.  Acute hypoxic respiratory failure/volume overload/acute exacerbation of congestive heart failure:  Improved on IV Lasix; noted lasix was decreased to 60 mg IV BID.  Continue lasix at 60 mg IV BID for now.  S/p Empiric abx per primary team - azithro/ CTX.(now off)   3.  Anemia of chronic kidney disease:  iron studies with 9% tsat, allergic to IV iron.  On PO iron BID   4.  Hypertension: controlled  5.  Type 2 diabetes mellitus: per primary service.  6.  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  Evaluation for plasma cell dyscrasia undertaken by primary service (SPEP negative) but other malignancy may be playing a role.   7.  RLE pain/ swelling- venous doppler negative, CT scan 9/10 femur and pelvis showing R distal iliopsoas muscle enlargement, hemorrhage vs bursitis.  Would avoid contrasted scans.  Has been on [facility] with Lovenox due to #10- has been stopped for now, see further discussion below  8.  New onset heart failure: TTE EF 25-30%.  Heart failure following. On BiDil  9.  Afib: on amio   10.  RUE DVT: hep gtt--> lovenox on hold for possible iliopsoas hemorrhage.  She has 2 indications to be on  anticoagulation- RUE DVT and Afib.  Would recommend d/w vascular/ radiology if we can differentiate through noncontrasted imaging if this is bursitis vs hemorrhage. Agree with CBC checks       Subjective:    Feels ok today - much better than when she came in.  labs drawn just before midnight overnight.  She had 3.6 Liters UOP over 9/12.  Has been on 2 liters oxygen per charting.  Not on oxygen at home.  No functioning HD access currently.  Was on 40 mg daily of lasix for about a week and this was increased to 80 mg daily just before admission but she didn't even have a chance to start new dose.   Review of systems:  Shortness of breath with moving around to get cleaned up this morning. No chest pain. No shortness of breath at rest No n/v     Objective:   BP 129/86 (BP Location: Left Arm)   Pulse 81   Temp 98.6 F (37 C) (Oral)   Resp 20   Ht 5\' 11"  (1.803 m)   Wt 108.2 kg   LMP 11/05/2012   SpO2 100%   BMI 33.28 kg/m   Intake/Output Summary (Last 24 hours) at 03/20/2020 1051 Last data filed at 03/20/2020 1000 Gross per 24 hour  Intake 240 ml  Output 3300 ml  Net -3060 ml   Weight change: -2.268 kg  Physical Exam:   Gen: sitting up in bed CVS: RRR  Resp: unlabored at rest but  bibasilar crackles; on 1.5 - 2 liters oxygen Abd: nondistended; NT Ext: trace to 1+ edema Psych normal mood and affect Neuro - alert and oriented x 3 provides hx and follows commands  Imaging: No results found.  Labs: BMET Recent Labs  Lab 03/14/20 0920 03/14/20 1529 03/15/20 0519 03/16/20 3016 03/17/20 0630 03/18/20 0205 03/19/20 0356 03/19/20 2334  NA 141  --  141 140 140 141 141 139  K 4.1  --  3.8 3.6 3.3* 3.9 3.7 3.9  CL 113*  --  112* 110 109 110 109 107  CO2 17*  --  21* 19* 20* 20* 22 19*  GLUCOSE 145*  --  113* 149* 100* 102* 95 153*  BUN 26*  --  26* 30* 32* 33* 32* 33*  CREATININE 2.23*  --  2.37* 2.35* 2.52* 2.46* 2.52* 2.63*  CALCIUM 8.1*  --  8.0* 7.6* 7.4* 7.4* 7.6*  7.3*  PHOS  --  3.6  --   --  4.0 3.8 3.6 3.0   CBC Recent Labs  Lab 03/14/20 0920 03/15/20 0519 03/18/20 2204 03/19/20 0356 03/19/20 1925 03/19/20 2334  WBC 8.8   < > 4.8 4.4 4.9 5.0  NEUTROABS 7.8*  --   --  3.1  --  3.6  HGB 8.8*   < > 7.6* 7.5* 7.0* 8.0*  HCT 30.2*   < > 24.8* 24.7* 22.7* 24.3*  MCV 101.3*   < > 95.8 95.0 94.6 92.4  PLT 178   < > 161 178 201 192   < > = values in this interval not displayed.    Medications:    . amiodarone  200 mg Oral BID  . atorvastatin  10 mg Oral Daily  . calcitRIOL  0.5 mcg Oral Daily  . calcium-vitamin D  1 tablet Oral BID  . ferrous sulfate  325 mg Oral BID WC  . furosemide  60 mg Intravenous BID  . insulin aspart  0-20 Units Subcutaneous TID WC  . isosorbide-hydrALAZINE  1 tablet Oral TID  . loratadine  10 mg Oral Daily  . mycophenolate  540 mg Oral BID  . pantoprazole  40 mg Oral Daily  . predniSONE  2.5 mg Oral Q breakfast  . sodium chloride flush  3 mL Intravenous Q12H  . tacrolimus  3 mg Oral BID  . vitamin B-12  1,000 mcg Oral Daily     Claudia Desanctis MD 03/20/2020, 11:08 AM

## 2020-03-21 ENCOUNTER — Inpatient Hospital Stay (HOSPITAL_COMMUNITY): Payer: Medicare Other

## 2020-03-21 DIAGNOSIS — I5021 Acute systolic (congestive) heart failure: Secondary | ICD-10-CM

## 2020-03-21 LAB — CBC WITH DIFFERENTIAL/PLATELET
Abs Immature Granulocytes: 0.03 10*3/uL (ref 0.00–0.07)
Basophils Absolute: 0 10*3/uL (ref 0.0–0.1)
Basophils Relative: 0 %
Eosinophils Absolute: 0.1 10*3/uL (ref 0.0–0.5)
Eosinophils Relative: 3 %
HCT: 23.7 % — ABNORMAL LOW (ref 36.0–46.0)
Hemoglobin: 7.2 g/dL — ABNORMAL LOW (ref 12.0–15.0)
Immature Granulocytes: 1 %
Lymphocytes Relative: 15 %
Lymphs Abs: 0.8 10*3/uL (ref 0.7–4.0)
MCH: 28.8 pg (ref 26.0–34.0)
MCHC: 30.4 g/dL (ref 30.0–36.0)
MCV: 94.8 fL (ref 80.0–100.0)
Monocytes Absolute: 0.4 10*3/uL (ref 0.1–1.0)
Monocytes Relative: 8 %
Neutro Abs: 3.9 10*3/uL (ref 1.7–7.7)
Neutrophils Relative %: 73 %
Platelets: 192 10*3/uL (ref 150–400)
RBC: 2.5 MIL/uL — ABNORMAL LOW (ref 3.87–5.11)
RDW: 15.4 % (ref 11.5–15.5)
WBC: 5.3 10*3/uL (ref 4.0–10.5)
nRBC: 0 % (ref 0.0–0.2)

## 2020-03-21 LAB — RENAL FUNCTION PANEL
Albumin: 2.4 g/dL — ABNORMAL LOW (ref 3.5–5.0)
Anion gap: 9 (ref 5–15)
BUN: 36 mg/dL — ABNORMAL HIGH (ref 6–20)
CO2: 24 mmol/L (ref 22–32)
Calcium: 7.8 mg/dL — ABNORMAL LOW (ref 8.9–10.3)
Chloride: 109 mmol/L (ref 98–111)
Creatinine, Ser: 2.75 mg/dL — ABNORMAL HIGH (ref 0.44–1.00)
GFR calc Af Amer: 21 mL/min — ABNORMAL LOW (ref >60)
GFR calc non Af Amer: 19 mL/min — ABNORMAL LOW (ref >60)
Glucose, Bld: 109 mg/dL — ABNORMAL HIGH (ref 70–99)
Phosphorus: 3.8 mg/dL (ref 2.5–4.6)
Potassium: 3.7 mmol/L (ref 3.5–5.1)
Sodium: 142 mmol/L (ref 135–145)

## 2020-03-21 LAB — GLUCOSE, CAPILLARY
Glucose-Capillary: 98 mg/dL (ref 70–99)
Glucose-Capillary: 115 mg/dL — ABNORMAL HIGH (ref 70–99)
Glucose-Capillary: 135 mg/dL — ABNORMAL HIGH (ref 70–99)
Glucose-Capillary: 162 mg/dL — ABNORMAL HIGH (ref 70–99)

## 2020-03-21 LAB — NM MYOCAR MULTI W/SPECT W/WALL MOTION / EF
Estimated workload: 1 METS
Exercise duration (min): 0 min
Exercise duration (sec): 0 s
MPHR: 164 {beats}/min
Peak HR: 89 {beats}/min
Percent HR: 54 %
RPE: 0
Rest HR: 78 {beats}/min

## 2020-03-21 LAB — TACROLIMUS LEVEL: Tacrolimus (FK506) - LabCorp: 2.1 ng/mL (ref 2.0–20.0)

## 2020-03-21 LAB — MAGNESIUM: Magnesium: 1.5 mg/dL — ABNORMAL LOW (ref 1.7–2.4)

## 2020-03-21 MED ORDER — TECHNETIUM TC 99M TETROFOSMIN IV KIT
30.0000 | PACK | Freq: Once | INTRAVENOUS | Status: AC | PRN
  Administered 2020-03-21: 30 via INTRAVENOUS

## 2020-03-21 MED ORDER — REGADENOSON 0.4 MG/5ML IV SOLN: 0.4000 mg | Freq: Once | INTRAVENOUS | Status: AC

## 2020-03-21 MED ORDER — REGADENOSON 0.4 MG/5ML IV SOLN
INTRAVENOUS | Status: AC
  Administered 2020-03-21: 0.4 mg via INTRAVENOUS
  Filled 2020-03-21: qty 5

## 2020-03-21 MED ORDER — MAGNESIUM SULFATE 2 GM/50ML IV SOLN
2.0000 g | Freq: Once | INTRAVENOUS | Status: AC
  Administered 2020-03-21: 2 g via INTRAVENOUS
  Filled 2020-03-21: qty 50

## 2020-03-21 MED ORDER — TECHNETIUM TC 99M TETROFOSMIN IV KIT
10.0000 | PACK | Freq: Once | INTRAVENOUS | Status: AC | PRN
  Administered 2020-03-21: 10 via INTRAVENOUS

## 2020-03-21 NOTE — Progress Notes (Addendum)
Patient ID: [name], female   DOB: 03-13-1966, 54 y.o.   MRN: [id number]     Advanced Heart Failure Rounding Note  PCP-Cardiologist: No primary care provider on file.   Subjective:    -2.8L in UOP yesterday w/ IV Lasix. Net I/Os negative 14L total since admit. Wt down an additional 8 lb. Down 24 lb total. Breathing improved but continues on 2L [place]. Urine clear.   SCr trending up, 2.52>>2.63>>2.75. BP ok.   Hgb 7.2.   On for NST today.    Objective:   Weight Range: 104.3 kg Body mass index is 32.08 kg/m.   Vital Signs:   Temp:  [97.9 F (36.6 C)-98.6 F (37 C)] 97.9 F (36.6 C) (09/14 0437) Pulse Rate:  [72-82] 72 (09/14 0437) Resp:  [14-21] 17 (09/14 0437) BP: (109-142)/(61-86) 136/76 (09/14 0437) SpO2:  [97 %-100 %] 100 % (09/14 0437) Weight:  [104.3 kg] 104.3 kg (09/14 0500) Last BM Date: 03/20/20  Weight change: Filed Weights   03/19/20 0436 03/20/20 0459 03/21/20 0500  Weight: 110.5 kg 108.2 kg 104.3 kg    Intake/Output:   Intake/Output Summary (Last 24 hours) at 03/21/2020 0750 Last data filed at 03/21/2020 0647 Gross per 24 hour  Intake 128.15 ml  Output 2750 ml  Net -2621.85 ml      Physical Exam    General:  Fatigue/ chronically ill appearing. No distress  HEENT: normal Neck: supple. JVP ~10 cm . Carotids 2+ bilat; no bruits. No lymphadenopathy or thryomegaly appreciated. Cor: PMI nondisplaced. Regular rate & rhythm. No rubs, gallops or murmurs. Lungs: clear bilaterally, no wheezing  Abdomen: soft, nontender, nondistended. No hepatosplenomegaly. No bruits or masses. Good bowel sounds. Extremities: no cyanosis, clubbing, rash, bilateral unna boots. Trace bilateral LEE Neuro: alert & orientedx3, cranial nerves grossly intact. moves all 4 extremities w/o difficulty. Affect pleasant   Telemetry   Rhythm analysis difficult on tele. Checking 12 lead EKG   EKG    12 lead pending.   Labs    CBC Recent Labs    03/19/20 2334  03/21/20 0436  WBC 5.0 5.3  NEUTROABS 3.6 3.9  HGB 8.0* 7.2*  HCT 24.3* 23.7*  MCV 92.4 94.8  PLT 192 053   Basic Metabolic Panel Recent Labs    03/19/20 2334 03/21/20 0436  NA 139 142  K 3.9 3.7  CL 107 109  CO2 19* 24  GLUCOSE 153* 109*  BUN 33* 36*  CREATININE 2.63* 2.75*  CALCIUM 7.3* 7.8*  MG  --  1.5*  PHOS 3.0 3.8   Liver Function Tests Recent Labs    03/19/20 2334 03/21/20 0436  ALBUMIN 2.4* 2.4*   No results for input(s): LIPASE, AMYLASE in the last 72 hours. Cardiac Enzymes No results for input(s): CKTOTAL, CKMB, CKMBINDEX, TROPONINI in the last 72 hours.  BNP: BNP (last 3 results) Recent Labs    03/14/20 0920  BNP >4,500.0*    ProBNP (last 3 results) No results for input(s): PROBNP in the last 8760 hours.   D-Dimer No results for input(s): DDIMER in the last 72 hours. Hemoglobin A1C No results for input(s): HGBA1C in the last 72 hours. Fasting Lipid Panel No results for input(s): CHOL, HDL, LDLCALC, TRIG, CHOLHDL, LDLDIRECT in the last 72 hours. Thyroid Function Tests No results for input(s): TSH, T4TOTAL, T3FREE, THYROIDAB in the last 72 hours.  Invalid input(s): FREET3  Other results:   Imaging    No results found.   Medications:     Scheduled  Medications: . amiodarone  200 mg Oral BID  . atorvastatin  10 mg Oral Daily  . calcitRIOL  0.5 mcg Oral Daily  . calcium-vitamin D  1 tablet Oral BID  . ferrous sulfate  325 mg Oral BID WC  . furosemide  60 mg Intravenous BID  . insulin aspart  0-20 Units Subcutaneous TID WC  . isosorbide-hydrALAZINE  1 tablet Oral TID  . loratadine  10 mg Oral Daily  . mycophenolate  540 mg Oral BID  . pantoprazole  40 mg Oral Daily  . predniSONE  2.5 mg Oral Q breakfast  . sodium chloride flush  3 mL Intravenous Q12H  . tacrolimus  3 mg Oral BID  . vitamin B-12  1,000 mcg Oral Daily    Infusions:   PRN Medications: acetaminophen **OR** acetaminophen, albuterol, guaiFENesin,  guaiFENesin-dextromethorphan, HYDROmorphone    Patient Profile   54 y/o AAF w/ h/o HTN, T2DM, tobacco abuse, CAD (extensive CAD on prior chest CT) and CKD s/p rt renal transplant in 2011 at [facility]. She is followed by [facility]. She has chronic LBBB but no other prior cardiac history.  She has a +FH of CHF (sister 50).   Assessment/Plan   1. Acute Systolic Heart Failure: - NYHA Class IIIb w/ marked fluid overload. BNP >4,500  - New diagnosis. EF 25-30 w/ diffuse hypokinesis. RV ok. Severe concentric LVH. No prior studies for comparison.  - suspect hypertensive cardiomyopathy 2/2 long standing HTN - also ?ischemic cardiomyopathy. Chest CT 4/21 shows multivessel CAD w/ heavily calcified LAD. Plan noninvasive ischemic w/u w/ Myoview today. Avoid cath w/ CKD.  - ? Cardiac Amyloid. Protein Electrophoresis - no M Spike.   - ? Tachy-Mediated.  - Volume status improved w/ IV Lasix, down 24 lb total. SCr and BUN continues to rise. Hold IV Lasix today. Recommend RHC tomorrow.  - no ARB/ARNi, spironolactone or digoxin w/ CKD - Stable BP, continue bidil to 1 tab tid.  - no ? blocker yet w/ acute decompensation - consider future SGLT2i w/ concomitant CKD and DM, pending GFR (will avoid for now given GFR <25)    2. Acute on Chronic Kidney Disease: - Nephrology following.  - Possible cardiorenal syndrome.  - s/p rt renal transplant in 2011 at [facility].  - on prednisone, Myfortic and tacrolimus  - Creatinine continues to trend up, 2.75 today   - Hold IV diuretics today. ? RHC tomorrow.   3. Atrial Fibrillation:  - New. Went into rapid AF 140s on 9/8 - now off IV amio. Continue PO amio 200 mg twice a day.  - Lovenox stopped by primary team with concern for iliopsoas hemorrhage.   4. Presumed CAP: - admit CXR suggestive of CHF w/ interstial edema as well as findings concerning for superimposed infection. PCT 2.65. Lactic acid 1.9. AF. Blood cultures negative - Now off abx.   5. CAD: - prior chest  CT 4/21 reviewed and showed multivessel CAD w/ heavily calcified LAD - not a candidate for LHC currently w/ abnormal renal function  - will plan nuclear study to assess for coronary ischemia prior to d/c  - continue statin, increased atorvastatin to 40 mg night to target LDL < 70  - no ASA w/ chronic anemia and need for anticoagulation for DVT/AF - hold ? blocker initiation while in acute decompensated HF  - smoking cessation imperative   6. RUE DVT: - no recent trauma. Concern for malignancy.  - V/Q scan negative.  - lower extremity doppler negative.  -  Now off Lovenox because of concern for iliopsoas hemorrhage.   7. Anemia of Chronic Disease/ Iron Deficiency Anemia: - 2/2 CKD - Hgb 7.2 today. Transfusion per primary team  - Fe low. Sats Ratio 9% - Allergic to IV iron - On ferrous sulfate 325 BID   8. HTN: - Controlled   9. T2DM: - well controlled, Hgb A1c 5.0  - GFR borderline for SGLT2i   10. Tobacco Abuse:  - smokes 1/2 ppm  - smoking cessation advised   11. HLD - known CAD based on Chest CT - recent outpatient LP 6/21 showed elevated LDL at 84 mg/dL - increase home atorvastatin to 40 mg nightly - needs repeat FLP and HFTs in 6-8 weeks   12. Elevated Alk Phos/ Abnormal Bone Scans  - Alk Phos 1,300 on admit  - Had whole body bone scan 3/21 w/ diffuse uptake throughout the axial and appendicular skeleton suspicious for metastatic disease - undergoing w/u for Multiple Myeloma => SPEP negative.   13. ?Iliopsoas hemorrhage - CT femur/pelvis with right distal iliopsoas enlargement => hemorrhage versus bursitis.  Lovenox stopped.     Length of Stay: [address] PA-C  03/21/2020, 7:50 AM  Advanced Heart Failure Team Pager [phone] (M-F; 7a - 4p)  Please contact [facility] for night-coverage after hours (4p -7a ) and weekends on [email or web address]  Patient seen and examined with the above-signed Advanced Practice Provider and/or Housestaff. I  personally reviewed laboratory data, imaging studies and relevant notes. I independently examined the patient and formulated the important aspects of the plan. I have edited the note to reflect any of my changes or salient points. I have personally discussed the plan with the patient and/or family.  Feels much better. Breathing better no CP. Creatinine continues to climb slowly. Remains in NSR. Now on po amio. Off AC due to leg hematoma. Would keep off for now.   General:  Sitting up in bed No resp difficulty HEENT: normal Neck: supple. JVP 7. Carotids 2+ bilat; no bruits. No lymphadenopathy or thryomegaly appreciated. Cor: PMI nondisplaced. Regular rate & rhythm. No rubs, gallops or murmurs. Lungs: clear Abdomen: soft, nontender, nondistended. No hepatosplenomegaly. No bruits or masses. Good bowel sounds. Extremities: no cyanosis, clubbing, rash, trace edema Neuro: alert & orientedx3, cranial nerves grossly intact. moves all 4 extremities w/o difficulty. Affect pleasant  Volume status improved. Agreed with holding lasix.  Can consider RHC if needed (will d/w Renal).   High suspicion for underlying CAD and ischemic CM. Plan Myoview today.   Remains in NSR on po amio. Avoid AC for now.    Glori Bickers, MD  10:11 AM

## 2020-03-21 NOTE — Progress Notes (Signed)
lexiscan portion completed without complications.  BP was elevated.

## 2020-03-21 NOTE — Progress Notes (Signed)
Pt picked up by transporter and taken to nuclear medicine for stress test.

## 2020-03-21 NOTE — Progress Notes (Addendum)
[facility] Progress Note    Assessment/ Plan:    51F transplant pt of Dr. Elissa Hefty coming in with vol overload, new CHF, Afib, RUE DVT and ? Iliopsoas hemorrhage vs bursitis  Assessment/Plan: 1.  Acute kidney injury in patient with chronic kidney disease stage III T: Baseline Cr is 1.7.  Suspect the current acute kidney injury may be hemodynamically mediated in the setting of decreased effective arterial blood volume/interstitial fluid and ongoing diuresis possibly cardiorenal syndrome as well.   - Noted off lasix - she got a dose of lasix 60 mg IV earlier today - From renal standpoint, if lasix needed once for shortness of breath ok to give a 60 mg dose.  She states she's fine right now - Continue pred 2.5 mg daily, Myfortic 540 mg BID, tacrolimus 3 mg BID (confirmed by pharmacy).  Level pending however on chart review this was collected at 12:15 and not likely to be a trough.  I changed tacrolimus to 8 am and 8 pm dosing  2.  Acute hypoxic respiratory failure/volume overload/acute exacerbation of congestive heart failure:   - Improved on IV Lasix; noted lasix was decreased to 60 mg IV BID and then stopped on 9/14 after one dose - S/p Empiric abx per primary team - azithro/ CTX.(now off)   3.  Anemia of chronic kidney disease:  iron studies with 9% tsat, allergic to IV iron.  On PO iron BID   4.  Hypertension: controlled  5.  Type 2 diabetes mellitus: per primary service.  6.  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  Evaluation for plasma cell dyscrasia undertaken by primary service (SPEP negative) but other malignancy may be playing a role.   7.  RLE pain/ swelling- venous doppler negative, CT scan 9/10 femur and pelvis showing R distal iliopsoas muscle enlargement, hemorrhage vs bursitis.  Would avoid contrasted scans.  Has been on [facility] with Lovenox due to #10- has been stopped  for now, see further discussion below  8.  New onset heart failure: TTE EF 25-30%.  Heart failure following. On BiDil  9.  Afib: on amio   10.  RUE DVT: hep gtt--> lovenox on hold for possible iliopsoas hemorrhage.  She has 2 indications to be on anticoagulation- RUE DVT and Afib.  Would recommend d/w vascular/ radiology if we can differentiate through noncontrasted imaging if this is bursitis vs hemorrhage  Subjective:    Feels ok.  Off floor on earlier attempt for a stress test which is characterized as abnormal high risk stress nuclear study.     Review of systems:  Denies shortness of breath to me  No chest pain. No n/v     Objective:   BP 133/79 (BP Location: Left Arm)   Pulse 76   Temp 98.3 F (36.8 C) (Oral)   Resp 18   Ht 5\' 11"  (1.803 m)   Wt 104.3 kg   LMP 11/05/2012   SpO2 96%   BMI 32.08 kg/m   Intake/Output Summary (Last 24 hours) at 03/21/2020 1624 Last data filed at 03/21/2020 0647 Gross per 24 hour  Intake 128.15 ml  Output 2300 ml  Net -2171.85 ml   Weight change: -3.901 kg  Physical Exam:   Gen: sitting up in bed CVS: RRR  Resp: unlabored at rest but bibasilar crackles Abd: nondistended; NT Ext: trace edema Psych normal mood and affect Neuro - alert and oriented x 3 provides hx  and follows commands  Imaging: NM Myocar Multi W/Spect W/Wall Motion / EF  Result Date: 03/21/2020  There was no ST segment deviation noted during stress.  Defect 1: There is a large defect of severe severity present in the basal inferior, mid inferior and apical inferior location.  Defect 2: There is a medium defect of severe severity present in the basal anterior and mid anterior location.  Findings consistent with prior myocardial infarction with peri-infarct ischemia.  This is a high risk study.  Nuclear stress EF: 34%.  The left ventricular ejection fraction is moderately decreased (30-44%).  Abnormal, high risk stress nuclear study with prior anterior infarct  with mild peri-infarct ischemia and prior inferior infarct with mild peri-infarct ischemia.  Gated ejection fraction 34% with global hypokinesis.  Moderate left ventricular enlargement.    Labs: BMET Recent Labs  Lab 03/15/20 0519 03/16/20 4010 03/17/20 0630 03/18/20 0205 03/19/20 0356 03/19/20 2334 03/21/20 0436  NA 141 140 140 141 141 139 142  K 3.8 3.6 3.3* 3.9 3.7 3.9 3.7  CL 112* 110 109 110 109 107 109  CO2 21* 19* 20* 20* 22 19* 24  GLUCOSE 113* 149* 100* 102* 95 153* 109*  BUN 26* 30* 32* 33* 32* 33* 36*  CREATININE 2.37* 2.35* 2.52* 2.46* 2.52* 2.63* 2.75*  CALCIUM 8.0* 7.6* 7.4* 7.4* 7.6* 7.3* 7.8*  PHOS  --   --  4.0 3.8 3.6 3.0 3.8   CBC Recent Labs  Lab 03/19/20 0356 03/19/20 1925 03/19/20 2334 03/21/20 0436  WBC 4.4 4.9 5.0 5.3  NEUTROABS 3.1  --  3.6 3.9  HGB 7.5* 7.0* 8.0* 7.2*  HCT 24.7* 22.7* 24.3* 23.7*  MCV 95.0 94.6 92.4 94.8  PLT 178 201 192 192    Medications:    . amiodarone  200 mg Oral BID  . atorvastatin  10 mg Oral Daily  . calcitRIOL  0.5 mcg Oral Daily  . calcium-vitamin D  1 tablet Oral BID  . ferrous sulfate  325 mg Oral BID WC  . insulin aspart  0-20 Units Subcutaneous TID WC  . isosorbide-hydrALAZINE  1 tablet Oral TID  . loratadine  10 mg Oral Daily  . mycophenolate  540 mg Oral BID  . pantoprazole  40 mg Oral Daily  . predniSONE  2.5 mg Oral Q breakfast  . sodium chloride flush  3 mL Intravenous Q12H  . tacrolimus  3 mg Oral BID  . vitamin B-12  1,000 mcg Oral Daily     Claudia Desanctis MD 03/21/2020, 4:24 PM

## 2020-03-21 NOTE — Progress Notes (Addendum)
Subjective: Yesterday I discussed with [name] the possibility of using MRI to determine if the swelling is hemorrhage or bursitis. She expressed deep misgivings about the procedure, not because of the MRI itself, but because she had a traumatizing experience with her most recent CT when the technologists putting her in the scanner ignored her directions regarding proper handling of her exquisitely painful right leg. She is worried that this will happen again. I told her she could think about it and let us know this morning, to which she agreed.  This morning she had her nuclear medicine scan. In conversation with her this afternoon, she reported that this scan had caused her thigh pain to worsen again. She is still in agreement with the plan to do the MRI, but would like to wait until tomorrow morning to do it because she is tired from the nuclear medicine scan this morning. She feels like her swelling, nasal congestion, and breathing are better, although she feels like the right arm is taking longer than everything else to decompress.   Objective: Vital signs in last 24 hours: Vitals:   03/21/20 1037 03/21/20 1043 03/21/20 1045 03/21/20 1207  BP: (!) 181/83 (!) 194/71 (!) 142/59 133/79  Pulse: 69 90 67 76  Resp:    18  Temp:    98.3 F (36.8 C)  TempSrc:    Oral  SpO2:    96%  Weight:      Height:       Weight change:  104.3 kg from 114.5 kg at admission, down 10.2 kg.  Intake/Output Summary (Last 24 hours) at 03/21/2020 0708 Last data filed at 03/21/2020 0647 Gross per 24 hour  Intake 128.15 ml  Output 2750 ml  Net -2621.85 ml  Cumulative output 17 L, cumulative net out 13.6 L  Physical Exam Constitutional:  Appearance: She is well-developed. She isobese.  HENT:  Head: Normocephalicand atraumatic.  Cardiovascular:  Rate and Rhythm: Normal rate.  Pulses: Normal pulses.  Pulmonary:  Effort: Pulmonary effort is normal.  Breath sounds: No rales. Some  wheezes. Abdominal:  General: Abdomen is flat. There is nodistension.  Musculoskeletal:  General: Swelling(dependent swelling around hips)and tenderness(right hip)present.  Cervical back: Normal range of motion.  Right lower leg: Edemapresent.  Left lower leg: Edemapresent.  Skin: General: Skin is warmand dry.  Coloration: Skin is not pale.  Neurological:  General: No focal deficitpresent.  Mental Status: She is alertand oriented to person, place, and time.  Labs: Mg 1.5, Hgb 7.2 from 8.0 from 7.0, Cr 2.75 from 2.63 and 2.23 at admission, BUN 36 from 33 and 26 at admission, Ca 7.8 from 7.3 yesterday, corrected Ca today is 9.1. GFR 21 from 28 at admission.  Assessment/Plan:  Principal Problem:   Heart failure with reduced ejection fraction (HCC) Active Problems:   HTN (hypertension)   Type II diabetes mellitus, well controlled ([place])   S/p cadaver renal transplant   Secondary hypoparathyroidism ([place])   GERD (gastroesophageal reflux disease)   Vitamin B 12 deficiency   Respiratory failure (HCC)   Anemia of chronic disease  Patient Summary: [name] is a 54 year old woman with a PMH significant for DMII, GERD, hypertension, renal transplant,CKD, and tobacco usewho presentedwith SOB, orthopnea, and anasarca, found to have new diagnoses of HFrEF,atrial fibrillation with RVR, and R axillary vein DVT.  Thigh pain--possible R iliopsoas hematoma/hemorrhage The pain in her right thigh is improving. Given its overall history of worsening on anticoagulants and improving with cessation, the pain  and swelling on CT are concerning for right iliopsoas hemorrhage. Her hemoglobin of 7.2 today may reflect either a drop or a slight rise compared to the 7.0 or the 8.0 drawn only hours apart two days ago. It is difficult to say if the 8.0 was an artifactual reading, but this seems possible. Improvement in her Hgb with cessation of the enoxaparin could  reflect cessation of her iliopsoas hemorrhage and increasing hemoconcentration in the setting of diuresis.  - MR pelvis tomorrow for further imaging of iliopsoas and premedication with dilaudid - Holding enoxaparin for now but will restart if evidence of PE or life-threatening clots arise -Dilaudid1 mg POPRNq4hrs for pain -Trend CBCs  HFrEF(EF 25%) Patient continues to have good diuresis (2.75 L) with improved shortness of breath and swelling. Cardiology working patient up for ischemic etiologies vs long term HTN.Noninvasive ischemic workup today per cardiology revealed no ST segment deviation during the procedure but revealed severe defects consistent with prior MI, likely the cause of her HFrEF. Cardiology will defer RHC for now. - Lasix discontinuedper cardiology recommendations given steady rise in creatinine - Unna boots - Start Bidil 1 tab TID - Consider future SGLT2 inhibitor pending GFR  Atrial fibrillation with RVR CHA2DS2-VAScScore = 4(1 point each for HTN, DM, vascular disease, and gender).  -Switched to oral amiodarone 200 mg 9/13 - Monitor on tele  CAD: Prior chest CT 4/21 showed multivessel CAD w/ heavily calcified LAD. Not a candidate for LHC currently w/ abnormal renal function. Nuclear study done today showing no ST segment deviation but severe defects consistent with prior MI. Will risk stratify in order to avoid future MI. -Continue atorvastatin - hold?blocker initiation while in acute decompensated HF   DVTright upper extremity Held enoxaparin due to concern for iliopsoas hemorrhage. 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 - Will re-evaluate her need for anticoagulation daily and reassess her risk of bleeding.  Acute kidney injury in patient with  chronic kidney disease stage IIIs/p transplant Her continued rise in creatinine is concerning for continued AKI on CKD. Nephrology following and making pertinent recommendations. - Prednisone 2.5 mg daily - Mycophenolate 540 mg BID - Tacrolimus 3 mg BID - Checking tacrolimus level regularly - Avoid NSAIDs, morphine, baclofen, oral sodium phosphate and mag citrate-based laxatives, and iodinated IV contrast - Strict I/Os - Dailyrenal function panel  Elevated alkaline phosphatase, bony lesions Likely due to longstanding secondary hyperparathyroidism given reassuring SPEP.  Secondary hyperparathyroidism PTH up to 71from 324 1 year ago. Low Ca, normal phos, and longstanding elevated PTH, consistent with secondary hyperparathyroidism. Low magnesium today repleted. -Pending 25-hydroxy vitamin D level - Calcitriol0.5 mcg daily -Calcium-vitamin D 500-200 mgBID  Anemia of chronic disease Iron studies and overall picture consistent with anemia of chronic disease. She will likely benefit from calcium and vitamin D supplementation and possibly need EPO per nephrology's recommendations. - Vitamin B12 levellow, supplementing with 1000 mcg B12 daily - 325 mg ferrous sulfate BID  DMII SSI Novolog resistant while inpatient  Rhinorrhea  - Loratadine 10 mg daily - Guaifenesin-dextromethorphan 100-10mg  prn  Diet:Heart Healthy OYD:XAJO,INOM VEH:MCNO Code:Full PT/OT recs:Pending,none. TOC recs:pending  Dispo: Anticipated discharge toTBD   LOS: 7 days   Mardella Layman, Medical Student 03/21/2020, 7:08 AM

## 2020-03-22 ENCOUNTER — Inpatient Hospital Stay (HOSPITAL_COMMUNITY): Payer: Medicare Other

## 2020-03-22 DIAGNOSIS — N1832 Chronic kidney disease, stage 3b: Secondary | ICD-10-CM | POA: Diagnosis present

## 2020-03-22 DIAGNOSIS — I480 Paroxysmal atrial fibrillation: Secondary | ICD-10-CM | POA: Diagnosis not present

## 2020-03-22 LAB — GLUCOSE, CAPILLARY
Glucose-Capillary: 87 mg/dL (ref 70–99)
Glucose-Capillary: 110 mg/dL — ABNORMAL HIGH (ref 70–99)
Glucose-Capillary: 141 mg/dL — ABNORMAL HIGH (ref 70–99)
Glucose-Capillary: 113 mg/dL — ABNORMAL HIGH (ref 70–99)

## 2020-03-22 LAB — CBC WITH DIFFERENTIAL/PLATELET
Abs Immature Granulocytes: 0.03 10*3/uL (ref 0.00–0.07)
Basophils Absolute: 0 10*3/uL (ref 0.0–0.1)
Basophils Relative: 0 %
Eosinophils Absolute: 0.1 10*3/uL (ref 0.0–0.5)
Eosinophils Relative: 3 %
HCT: 24.7 % — ABNORMAL LOW (ref 36.0–46.0)
Hemoglobin: 7.6 g/dL — ABNORMAL LOW (ref 12.0–15.0)
Immature Granulocytes: 1 %
Lymphocytes Relative: 15 %
Lymphs Abs: 0.8 10*3/uL (ref 0.7–4.0)
MCH: 29.5 pg (ref 26.0–34.0)
MCHC: 30.8 g/dL (ref 30.0–36.0)
MCV: 95.7 fL (ref 80.0–100.0)
Monocytes Absolute: 0.4 10*3/uL (ref 0.1–1.0)
Monocytes Relative: 8 %
Neutro Abs: 3.8 10*3/uL (ref 1.7–7.7)
Neutrophils Relative %: 73 %
Platelets: 202 10*3/uL (ref 150–400)
RBC: 2.58 MIL/uL — ABNORMAL LOW (ref 3.87–5.11)
RDW: 15.5 % (ref 11.5–15.5)
WBC: 5.2 10*3/uL (ref 4.0–10.5)
nRBC: 0 % (ref 0.0–0.2)

## 2020-03-22 LAB — RENAL FUNCTION PANEL
Albumin: 2.4 g/dL — ABNORMAL LOW (ref 3.5–5.0)
Anion gap: 11 (ref 5–15)
BUN: 37 mg/dL — ABNORMAL HIGH (ref 6–20)
CO2: 23 mmol/L (ref 22–32)
Calcium: 8.2 mg/dL — ABNORMAL LOW (ref 8.9–10.3)
Chloride: 108 mmol/L (ref 98–111)
Creatinine, Ser: 2.57 mg/dL — ABNORMAL HIGH (ref 0.44–1.00)
GFR calc Af Amer: 23 mL/min — ABNORMAL LOW (ref >60)
GFR calc non Af Amer: 20 mL/min — ABNORMAL LOW (ref >60)
Glucose, Bld: 94 mg/dL (ref 70–99)
Phosphorus: 3.5 mg/dL (ref 2.5–4.6)
Potassium: 3.8 mmol/L (ref 3.5–5.1)
Sodium: 142 mmol/L (ref 135–145)

## 2020-03-22 LAB — MAGNESIUM: Magnesium: 1.6 mg/dL — ABNORMAL LOW (ref 1.7–2.4)

## 2020-03-22 LAB — PREPARE RBC (CROSSMATCH)

## 2020-03-22 MED ORDER — DULOXETINE HCL 30 MG PO CPEP
30.0000 mg | ORAL_CAPSULE | Freq: Every day | ORAL | Status: DC
  Administered 2020-03-22 – 2020-03-24 (×3): 30 mg via ORAL
  Filled 2020-03-22 (×3): qty 1

## 2020-03-22 MED ORDER — ISOSORBIDE MONONITRATE ER 30 MG PO TB24
15.0000 mg | ORAL_TABLET | Freq: Every day | ORAL | Status: DC
  Administered 2020-03-23 – 2020-03-24 (×2): 15 mg via ORAL
  Filled 2020-03-22 (×2): qty 1

## 2020-03-22 MED ORDER — SODIUM CHLORIDE 0.9% IV SOLUTION: Freq: Once | INTRAVENOUS | Status: AC

## 2020-03-22 MED ORDER — METOPROLOL SUCCINATE ER 25 MG PO TB24
25.0000 mg | ORAL_TABLET | Freq: Every day | ORAL | Status: DC
  Administered 2020-03-23 – 2020-03-24 (×2): 25 mg via ORAL
  Filled 2020-03-22 (×2): qty 1

## 2020-03-22 MED ORDER — LORAZEPAM 0.5 MG PO TABS
0.5000 mg | ORAL_TABLET | Freq: Once | ORAL | Status: AC
  Administered 2020-03-22: 0.5 mg via ORAL
  Filled 2020-03-22: qty 1

## 2020-03-22 MED ORDER — DARBEPOETIN ALFA 40 MCG/0.4ML IJ SOSY
40.0000 ug | PREFILLED_SYRINGE | Freq: Once | INTRAMUSCULAR | Status: AC
  Administered 2020-03-22: 40 ug via SUBCUTANEOUS
  Filled 2020-03-22: qty 0.4

## 2020-03-22 MED ORDER — ATORVASTATIN CALCIUM 10 MG PO TABS
20.0000 mg | ORAL_TABLET | Freq: Every day | ORAL | Status: DC
  Administered 2020-03-23: 20 mg via ORAL
  Filled 2020-03-22: qty 2

## 2020-03-22 MED ORDER — HYDRALAZINE HCL 10 MG PO TABS
10.0000 mg | ORAL_TABLET | Freq: Four times a day (QID) | ORAL | Status: DC
  Administered 2020-03-23 – 2020-03-24 (×6): 10 mg via ORAL
  Filled 2020-03-22 (×6): qty 1

## 2020-03-22 MED ORDER — MAGNESIUM SULFATE 2 GM/50ML IV SOLN
2.0000 g | Freq: Once | INTRAVENOUS | Status: AC
  Administered 2020-03-22: 2 g via INTRAVENOUS
  Filled 2020-03-22: qty 50

## 2020-03-22 NOTE — Progress Notes (Addendum)
Subjective: [name] continues to do well overall. Her thigh is not particularly painful this morning as she lies in bed. She was still anxious about the MRI this morning but understood its utility. She is wondering when she will be able to go home since she has been in the hospital for over a week now and would like to return home as soon as possible after we have adequately treated her problems.  This afternoon, after her MRI, she feels well and does not report acute pain during our interview. She does report good pain control with the duloxetine 30 mg daily that had been prescribed to her by Dr. Marianna Payment previously and she is interested in pursuing this to improve her pain.  Objective:  Vital signs in last 24 hours: Vitals:   03/21/20 2027 03/21/20 2353 03/22/20 0500 03/22/20 0513  BP: 116/64 123/70  135/67  Pulse: 80 72  73  Resp: 16 17  17   Temp: 98.5 F (36.9 C) 98.4 F (36.9 C)  98 F (36.7 C)  TempSrc: Oral Oral  Oral  SpO2: 100% 99%  99%  Weight:   105.2 kg   Height:       Weight change: 0.873 kg  Intake/Output Summary (Last 24 hours) at 03/22/2020 6606 Last data filed at 03/22/2020 3016 Gross per 24 hour  Intake 340 ml  Output 2500 ml  Net -2160 ml   Physical Exam Constitutional:  Appearance: She is well-developed. She isobese.  HENT:  Head: Normocephalicand atraumatic.  Cardiovascular:  Rate and Rhythm: Normal rate.  Pulses: Normal pulses.  Pulmonary:  Effort: Pulmonary effort is normal.  Breath sounds: No rales. No wheezes. Abdominal:  General: Abdomen is flat. There is nodistension.  Musculoskeletal:  General: Swelling(dependent swelling around hips)and tenderness(right hip)present.  Cervical back: Normal range of motion.  Right lower leg: Moderate edemapresent.  Left lower leg: Mild edemapresent.  Skin: General: Skin is warmand dry.  Coloration: Skin is not pale.  Neurological:  General: No  focal deficitpresent.  Mental Status: She is alertand oriented to person, place, and time.  Labs: Mg 1.6 from 1.5 after repletion yesterday with 2g of magnesium sulfate, Cr 2.57 from 2.75, BUN 37 from 36, Ca 8.2 (corrected 9.5), albumin 2.4 from 2.4, GFR 23 from 21, Hgb 7.6 from 7.2.   NST yesterday showed ischemic changes consistent with prior MI.  MRI today shows R iliopsoas bursitis, not hemorrhage.  Assessment/Plan:  Principal Problem:   Heart failure with reduced ejection fraction (HCC) Active Problems:   HTN (hypertension)   Type II diabetes mellitus, well controlled ([place])   S/p cadaver renal transplant   Secondary hypoparathyroidism ([place])   GERD (gastroesophageal reflux disease)   Vitamin B 12 deficiency   Respiratory failure (HCC)   Anemia of chronic disease  Patient Summary: [name] is a 54 year old woman with a PMH significant for DMII, GERD, hypertension, CKD IIIb on transplanted kidney, and tobacco usewho presentedwith SOB, orthopnea, and anasarca, now found to have new diagnoses of ischemic HFrEF secondary to previous silent MI,atrial fibrillation with RVR, R axillary vein DVT due to 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  Right iliopsoas bursitis Thepain in herrightthigh is improving. MRI today revealed bursitis as the cause of the swelling in her right iliopsoas, not hemorrhage as previously suspected. -Dilaudid1 mg POPRNq4hrsfor pain - PT/OT evaluation today - Start 30 mg duloxetine daily  Ischemic HFrEF Patient continues to have good diuresis (2.5 L) with improved  shortness of breath and swelling. Cardiology have deferred [place] and will sign off. - Lasixdiscontinuedpercardiology recommendations given steady rise in creatinine with good effect and decrease in her creatinine today - Discontinue Unna boots -Will separateBidil into separate doses to maximize effects: Hydralazine 10 mg  q6, imdur 24 hr tablet 15 mg daily - Starting metoprolol succinate 25 mg daily  Atrial fibrillation with RVR CHA2DS2-VAScScore = 4(1 point each for HTN, DM, vascular disease, and gender).  -Discontinue amiodarone 200 mg 9/15, will start metoprolol 25 mg daily 9/16  - Bridge to warfarin with lovenox, with possibility of future DOAC transition - Discontinue tele - Repeat EKG  CAD, previous MI: Nuclear study 9/14 showed no ST segment deviation but severe defects consistent with prior MI. Will risk stratify in order to avoid future MI:  - HTN is controlled here, but uncontrolled previous to hospitalization. - Lipid panel WNL, but will increase atorvastatinto 20 mg daily - DM well controlled on insulin outpatient (A1c 5.1), will start SGLT2 inhibitor by discharge - CKD followed by nephrology here and outpatient - Smoking cessation counseling provided, she is committed to stopping - Diet and exercise: Nutrition counseling in our outpatient clinic after discharge - Metoprolol 25 mg daily   Right axillary vein DVT 2/2 ATIII deficiency 2/2 nephrotic syndrome Held enoxaparin due to concern for iliopsoas hemorrhage, but we are resuming her on anticoagulation given that this was found to be bursitis.The increase in her antithrombin IIIlevel from 57 to 72% indicates that at least part of her deficiency is well explained by heparin administration, although part of it is likely due to loss of ATIII in the urine due to her nephrotic syndrome. - Bridge to warfarin with lovenox, with possibility of future DOAC transition  Diuretic-induced AKI on CKDIIIb in transplanted kidney Creatinine down today after one dose of lasix yesterday with 2.5 L of urine output in 24 hours. 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 Low Ca, normal phos, longstanding elevated PTH, bony lesions and elevated alkaline phosphatase consistent with renal osteodystrophy caused by secondary hyperparathyroidism. Low magnesium yesterday and today repleted. Calcium improving on supplementation. -Pending 25-hydroxy vitamin D level -Calcitriol0.5 mcg daily -Calcium-vitamin D 500-200 mgBID  Anemia of chronic disease Hgb slightly improved to 7.6 today from 7.2 yesterday. Nephrology ordered 1 unit of blood. - Vitamin B12 levellow, supplementingwith 1000 mcg B12 daily - 325 mg ferrous sulfate BID  DMII SSI Novolog resistant while inpatient - Consider future SGLT2 inhibitor pending GFR  Rhinorrhea  -Loratadine 10 mg daily - Guaifenesin-dextromethorphan 100-10mg prn  Diet:Heart Healthy PTW:SFKC,LEXN TZG:YFVC Code:Full PT/OT recs:Pending,none. TOC recs:pending  Dispo: Anticipated discharge toTBD  LOS: 8 days   Mardella Layman, Medical Student 03/22/2020, 8:21 AM

## 2020-03-22 NOTE — Progress Notes (Addendum)
Patient ID: [name], female   DOB: October 11, 1965, 54 y.o.   MRN: [id number]     Advanced Heart Failure Rounding Note  PCP-Cardiologist: No primary care provider on file.   Subjective:    Overall good diuresis. Net I/Os -16L this admit. Diuretics held yesterday. SCr trending back down, 2.75>>2.57.    High risk NST but no recent nor past ischemic like CP.    There was no ST segment deviation noted during stress.  Defect 1: There is a large defect of severe severity present in the basal inferior, mid inferior and apical inferior location.  Defect 2: There is a medium defect of severe severity present in the basal anterior and mid anterior location.  Findings consistent with prior myocardial infarction with peri-infarct ischemia.  This is a high risk study.  Nuclear stress EF: 34%.  The left ventricular ejection fraction is moderately decreased (30-44%).   Abnormal, high risk stress nuclear study with prior anterior infarct with mild peri-infarct ischemia and prior inferior infarct with mild peri-infarct ischemia.  Gated ejection fraction 34% with global hypokinesis.  Moderate left ventricular enlargement.  Objective:   Weight Range: 105.2 kg Body mass index is 32.35 kg/m.   Vital Signs:   Temp:  [98 F (36.7 C)-98.6 F (37 C)] 98 F (36.7 C) (09/15 0513) Pulse Rate:  [54-90] 73 (09/15 0513) Resp:  [16-20] 17 (09/15 0513) BP: (116-194)/(59-84) 135/67 (09/15 0513) SpO2:  [96 %-100 %] 99 % (09/15 0513) Weight:  [105.2 kg] 105.2 kg (09/15 0500) Last BM Date: 03/21/20  Weight change: Filed Weights   03/20/20 0459 03/21/20 0500 03/22/20 0500  Weight: 108.2 kg 104.3 kg 105.2 kg    Intake/Output:   Intake/Output Summary (Last 24 hours) at 03/22/2020 0756 Last data filed at 03/22/2020 4492 Gross per 24 hour  Intake 340 ml  Output 2500 ml  Net -2160 ml      Physical Exam    General:  Well appearing, sitting up in bed eating breakfast. No respiratory  difficulty HEENT: normal Neck: supple. JVD ~8 cm. Carotids 2+ bilat; no bruits. No lymphadenopathy or thyromegaly appreciated. Cor: PMI nondisplaced. Regular rate & rhythm. No rubs, gallops or murmurs. Lungs: inspiratory rhonchi LUL anteriorly, otherwise normal  Abdomen: soft, nontender, nondistended. No hepatosplenomegaly. No bruits or masses. Good bowel sounds. Extremities: no cyanosis, clubbing, rash, edema, + bilateral unna boots  Neuro: alert & oriented x 3, cranial nerves grossly intact. moves all 4 extremities w/o difficulty. Affect pleasant.   Telemetry   NSR w/ LBBB 70s   EKG    No new EKG to review   Labs    CBC Recent Labs    03/21/20 0436 03/22/20 0343  WBC 5.3 5.2  NEUTROABS 3.9 3.8  HGB 7.2* 7.6*  HCT 23.7* 24.7*  MCV 94.8 95.7  PLT 192 010   Basic Metabolic Panel Recent Labs    03/21/20 0436 03/22/20 0343  NA 142 142  K 3.7 3.8  CL 109 108  CO2 24 23  GLUCOSE 109* 94  BUN 36* 37*  CREATININE 2.75* 2.57*  CALCIUM 7.8* 8.2*  MG 1.5* 1.6*  PHOS 3.8 3.5   Liver Function Tests Recent Labs    03/21/20 0436 03/22/20 0343  ALBUMIN 2.4* 2.4*   No results for input(s): LIPASE, AMYLASE in the last 72 hours. Cardiac Enzymes No results for input(s): CKTOTAL, CKMB, CKMBINDEX, TROPONINI in the last 72 hours.  BNP: BNP (last 3 results) Recent Labs    03/14/20 0920  BNP >4,500.0*    ProBNP (last 3 results) No results for input(s): PROBNP in the last 8760 hours.   D-Dimer No results for input(s): DDIMER in the last 72 hours. Hemoglobin A1C No results for input(s): HGBA1C in the last 72 hours. Fasting Lipid Panel No results for input(s): CHOL, HDL, LDLCALC, TRIG, CHOLHDL, LDLDIRECT in the last 72 hours. Thyroid Function Tests No results for input(s): TSH, T4TOTAL, T3FREE, THYROIDAB in the last 72 hours.  Invalid input(s): FREET3  Other results:   Imaging    NM Myocar Multi W/Spect W/Wall Motion / EF  Result Date: 03/21/2020  There  was no ST segment deviation noted during stress.  Defect 1: There is a large defect of severe severity present in the basal inferior, mid inferior and apical inferior location.  Defect 2: There is a medium defect of severe severity present in the basal anterior and mid anterior location.  Findings consistent with prior myocardial infarction with peri-infarct ischemia.  This is a high risk study.  Nuclear stress EF: 34%.  The left ventricular ejection fraction is moderately decreased (30-44%).  Abnormal, high risk stress nuclear study with prior anterior infarct with mild peri-infarct ischemia and prior inferior infarct with mild peri-infarct ischemia.  Gated ejection fraction 34% with global hypokinesis.  Moderate left ventricular enlargement.     Medications:     Scheduled Medications:  amiodarone  200 mg Oral BID   atorvastatin  10 mg Oral Daily   calcitRIOL  0.5 mcg Oral Daily   calcium-vitamin D  1 tablet Oral BID   ferrous sulfate  325 mg Oral BID WC   insulin aspart  0-20 Units Subcutaneous TID WC   isosorbide-hydrALAZINE  1 tablet Oral TID   loratadine  10 mg Oral Daily   LORazepam  0.5 mg Oral Once   mycophenolate  540 mg Oral BID   pantoprazole  40 mg Oral Daily   predniSONE  2.5 mg Oral Q breakfast   sodium chloride flush  3 mL Intravenous Q12H   tacrolimus  3 mg Oral BID   vitamin B-12  1,000 mcg Oral Daily    Infusions:   PRN Medications: acetaminophen **OR** acetaminophen, albuterol, guaiFENesin-dextromethorphan, HYDROmorphone    Patient Profile   54 y/o AAF w/ h/o HTN, T2DM, tobacco abuse, CAD (extensive CAD on prior chest CT) and CKD s/p rt renal transplant in 2011 at [facility]. She is followed by [facility]. She has chronic LBBB but no other prior cardiac history.  She has a +FH of CHF (sister 41).   Assessment/Plan   1. Acute Systolic Heart Failure: - NYHA Class IIIb w/ marked fluid overload. BNP >4,500  - New diagnosis. EF 25-30 w/ diffuse  hypokinesis. RV ok. Severe concentric LVH. No prior studies for comparison.  - suspect combination of hypertensive cardiomyopathy 2/2 long standing HTN + ischemic cardiomyopathy. Chest CT 4/21 shows multivessel CAD w/ heavily calcified LAD. NST high risk w/ prior anterior infarct with mild peri-infarct ischemia and prior inferior infarct with mild peri-infarct ischemia.  - in the presence of CKD s/p renal transplant and absence of ischemic CP, will avoid LHC and plan to treat CAD medically.  - ? Cardiac Amyloid. Protein Electrophoresis - no M Spike.   - Volume status improved w/ IV Lasix, down 23 lb total. IV diuretics on hold, SCr trending back down - no ARB/ARNi, spironolactone or digoxin w/ CKD - Stable BP, continue bidil to 1 tab tid.  - no ? blocker yet w/ acute decompensation -  consider future SGLT2i w/ concomitant CKD and DM, pending GFR (will avoid for now given GFR <25)    2. Acute on Chronic Kidney Disease: - Nephrology following.  - Possible cardiorenal syndrome.  - s/p rt renal transplant in 2011 at [facility].  - on prednisone, Myfortic and tacrolimus  - Creatinine trending back down w/ diuretic hold, 2.75>>2.57   3. Atrial Fibrillation:  - New. Went into rapid AF 140s on 9/8 - now off IV amio. Continue PO amio 200 mg twice a day.  - Lovenox stopped by primary team with concern for iliopsoas hemorrhage.   4. Presumed CAP: - admit CXR suggestive of CHF w/ interstial edema as well as findings concerning for superimposed infection. PCT 2.65. Lactic acid 1.9. AF. Blood cultures negative - Now off abx.   5. CAD: - prior chest CT 4/21 reviewed and showed multivessel CAD w/ heavily calcified LAD - High risk NST showing prior anterior infarct with mild peri-infarct ischemia and prior inferior infarct with mild peri-infarct ischemia.   - in the presence of CKD s/p renal transplant and absence of ischemic CP, will avoid LHC and plan to treat CAD medically.  - continue statin,  increased atorvastatin to 40 mg night to target LDL < 70  - no ASA w/ chronic anemia and need for anticoagulation for DVT/AF - can try adding low dose  blocker - smoking cessation imperative   6. RUE DVT: - no recent trauma. Concern for malignancy.  - V/Q scan negative.  - lower extremity doppler negative.  - Now off Lovenox because of concern for iliopsoas hemorrhage.   7. Anemia of Chronic Disease/ Iron Deficiency Anemia: - 2/2 CKD - Hgb 7.6 today. Transfusion per primary team  - Fe low. Sats Ratio 9% - Allergic to IV iron - On ferrous sulfate 325 BID   8. HTN: - Controlled   9. T2DM: - well controlled, Hgb A1c 5.0  - GFR borderline for SGLT2i   10. Tobacco Abuse:  - smokes 1/2 ppm  - smoking cessation advised   11. HLD - known CAD based on Chest CT + high risk NST - recent outpatient LP 6/21 showed elevated LDL at 84 mg/dL - increase home atorvastatin to 40 mg nightly - needs repeat FLP and HFTs in 6-8 weeks   12. Elevated Alk Phos/ Abnormal Bone Scans  - Alk Phos 1,300 on admit  - Had whole body bone scan 3/21 w/ diffuse uptake throughout the axial and appendicular skeleton suspicious for metastatic disease - undergoing w/u for Multiple Myeloma => SPEP negative.   13. ?Iliopsoas hemorrhage - CT femur/pelvis with right distal iliopsoas enlargement => hemorrhage versus bursitis.  Lovenox stopped.     Length of Stay: [address], PA-C  03/22/2020, 7:56 AM  Advanced Heart Failure Team Pager [phone] (M-F; 7a - 4p)  Please contact [facility] for night-coverage after hours (4p -7a ) and weekends on [email or web address]  Patient seen and examined with the above-signed Advanced Practice Provider and/or Housestaff. I personally reviewed laboratory data, imaging studies and relevant notes. I independently examined the patient and formulated the important aspects of the plan. I have edited the note to reflect any of my changes or salient points. I have  personally discussed the plan with the patient and/or family.  Clinically stable. Volume status ok. Creatinine improved with holding diuretics. Remains in NSR on po amio.   MRI of leg suggests iliopsoas bursitis and not hemorrhage  General:  Sitting up. No resp difficulty  HEENT: normal Neck: supple. JVP 7 . Carotids 2+ bilat; no bruits. No lymphadenopathy or thryomegaly appreciated. Cor: PMI nondisplaced. Regular rate & rhythm. No rubs, gallops or murmurs. Lungs: clear Abdomen: soft, nontender, nondistended. No hepatosplenomegaly. No bruits or masses. Good bowel sounds. Extremities: no cyanosis, clubbing, rash, edema + Unna boots  Neuro: alert & orientedx3, cranial nerves grossly intact. moves all 4 extremities w/o difficulty. Affect pleasant  Myoview suggests that cardiomyopathy more ischemic in nature and not due to AF. Given CKD and lack of high-grade anginal symptoms/severe ischemia on Myoview would not pursue coronary angiography at this time. Treat medically with ASA/statin.   From HF perspective Continue Bidil 1 tab TID. Will add low-dose carvedilol 3.125. Diuretic management per Renal.  Continue amio for AF. Would favor restarting Eliquis.   We will sign off  Glori Bickers, MD  12:40 PM

## 2020-03-22 NOTE — Progress Notes (Signed)
[facility] Progress Note    Assessment/ Plan:    82F transplant pt of Dr. Elissa Hefty coming in with vol overload, new CHF, Afib, RUE DVT   Assessment/Plan: 1.  Acute kidney injury in patient with chronic kidney disease stage III T: Baseline Cr is 1.7.  Suspect the current acute kidney injury may be hemodynamically mediated in the setting of decreased effective arterial blood volume/interstitial fluid and ongoing diuresis possibly cardiorenal syndrome as well.   - Improving with pausing diuretics - continue to hold lasix  - Continue pred 2.5 mg daily, Myfortic 540 mg BID, tacrolimus 3 mg BID (confirmed by pharmacy).  Level pending however on chart review this was collected at 12:15 on 9/12 and not likely to be a trough  2.  Acute hypoxic respiratory failure/volume overload/acute exacerbation of congestive heart failure.  Improved with lasix which was paused after rising Cr.   - S/p Empiric abx per primary team - azithro/ CTX.(now off)   3.  Anemia of chronic kidney disease:  iron studies with 9% tsat, allergic to IV iron.  On PO iron BID.  Transfuse 1 unit PRBC's on 9/15.  aranesp 40 mcg as well once on 9/15.   4.  Hypertension: controlled  5.  Type 2 diabetes mellitus: per primary service.  6.  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  Evaluation for plasma cell dyscrasia undertaken by primary service (SPEP negative) but other malignancy may be playing a role.   7.  RLE pain/ swelling- venous doppler negative, CT scan 9/10 femur and pelvis showing R distal iliopsoas muscle enlargement, hemorrhage vs bursitis.  Would avoid contrasted scans.  Has been on [facility] with Lovenox due to #10- has been stopped for now, see further discussion below  8.  New onset heart failure: TTE EF 25-30%.  Heart failure following. On BiDil.  Per CHF team, cardiomyopathy is more of ischemic nature and they  recommended medical management with ASA, statin at this time. Diuretics as above  9.  Afib: on amio   10.  RUE DVT: hep gtt--> lovenox was on hold for possible iliopsoas hemorrhage.  She has 2 indications to be on anticoagulation- RUE DVT and Afib. Team is reinitiating anticoagulation   Subjective:    She had 2.5 liters UOP over 9/14 as well as an unmeasured void.  We discussed risks, benefits, indications for blood transfusion and for aranesp she agrees to both.    Review of systems: Shortness of breath with exertion but hasn't really walked around much No chest pain No n/v     Objective:   BP 129/78 (BP Location: Left Arm)   Pulse 73   Temp 98.6 F (37 C) (Oral)   Resp 19   Ht 5\' 11"  (1.803 m)   Wt 105.2 kg   LMP 11/05/2012   SpO2 99%   BMI 32.35 kg/m   Intake/Output Summary (Last 24 hours) at 03/22/2020 1330 Last data filed at 03/22/2020 5573 Gross per 24 hour  Intake 763 ml  Output 2500 ml  Net -1737 ml   Weight change: 0.873 kg  Physical Exam:   Gen: sitting in bed in NAD CVS: S1S2 no rub Resp: unlabored at rest but bibasilar crackles; room air Abd: nondistended; NT Ext: no to trace edema appreciated Psych normal mood and affect Neuro - alert and oriented x 3 provides hx and follows commands  Imaging: MR PELVIS WO CONTRAST  Result Date: 03/22/2020 CLINICAL DATA:  Followup abnormal CT scan. Iliopsoas process on the right noted. EXAM: MRI PELVIS WITHOUT CONTRAST TECHNIQUE: Multiplanar multisequence MR imaging of the pelvis was performed. No intravenous contrast was administered. COMPARISON:  CT scan 03/17/2020 FINDINGS: Examination is severely limited by body habitus and patient motion. Urinary Tract:  The bladder is grossly normal. Bowel: Moderate stool distended rectum could suggest fecal impaction. Vascular/Lymphatic: No gross abnormality. Reproductive:  The uterus and ovaries are grossly normal. Other: Small amount of presacral edema and diffuse and severe body  wall edema along with diffuse and severe myositis. Musculoskeletal: There are small bilateral hip joint effusions, right greater than left but I do not see any destructive bony changes or marrow edema to suggest septic arthritis or osteomyelitis. The SI joints appear to be grossly intact and the pubic symphysis is intact. As demonstrated on the CT scan there is a large complex fluid collection associated with the right iliopsoas bursa. This is areas of dark T2 signal intensity which I think correlate with calcifications on the CT scan. Findings most likely severe chronic calcific bursitis. Difficult to identified the iliopsoas tendon for certain but I do not see any secondary signs that it is ruptured. IMPRESSION: 1. Severely limited examination due to body habitus and patient motion. 2. 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 3. No findings for septic arthritis or osteomyelitis. 4. Diffuse and severe myositis.  And severe diffuse body wall edema. Electronically Signed   By: Marijo Sanes M.D.   On: 03/22/2020 11:32   NM Myocar Multi W/Spect W/Wall Motion / EF  Result Date: 03/21/2020  There was no ST segment deviation noted during stress.  Defect 1: There is a large defect of severe severity present in the basal inferior, mid inferior and apical inferior location.  Defect 2: There is a medium defect of severe severity present in the basal anterior and mid anterior location.  Findings consistent with prior myocardial infarction with peri-infarct ischemia.  This is a high risk study.  Nuclear stress EF: 34%.  The left ventricular ejection fraction is moderately decreased (30-44%).  Abnormal, high risk stress nuclear study with prior anterior infarct with mild peri-infarct ischemia and prior inferior infarct with mild peri-infarct ischemia.  Gated ejection fraction 34% with global hypokinesis.  Moderate left ventricular enlargement.    Labs: BMET Recent Labs  Lab  03/16/20 0160 03/17/20 0630 03/18/20 0205 03/19/20 0356 03/19/20 2334 03/21/20 0436 03/22/20 0343  NA 140 140 141 141 139 142 142  K 3.6 3.3* 3.9 3.7 3.9 3.7 3.8  CL 110 109 110 109 107 109 108  CO2 19* 20* 20* 22 19* 24 23  GLUCOSE 149* 100* 102* 95 153* 109* 94  BUN 30* 32* 33* 32* 33* 36* 37*  CREATININE 2.35* 2.52* 2.46* 2.52* 2.63* 2.75* 2.57*  CALCIUM 7.6* 7.4* 7.4* 7.6* 7.3* 7.8* 8.2*  PHOS  --  4.0 3.8 3.6 3.0 3.8 3.5   CBC Recent Labs  Lab 03/19/20 0356 03/19/20 0356 03/19/20 1925 03/19/20 2334 03/21/20 0436 03/22/20 0343  WBC 4.4   < > 4.9 5.0 5.3 5.2  NEUTROABS 3.1  --   --  3.6 3.9 3.8  HGB 7.5*   < > 7.0* 8.0* 7.2* 7.6*  HCT 24.7*   < > 22.7* 24.3* 23.7* 24.7*  MCV 95.0   < > 94.6 92.4 94.8 95.7  PLT 178   < > 201 192 192 202   < > = values in this interval not displayed.  Medications:    . amiodarone  200 mg Oral BID  . atorvastatin  10 mg Oral Daily  . calcitRIOL  0.5 mcg Oral Daily  . calcium-vitamin D  1 tablet Oral BID  . ferrous sulfate  325 mg Oral BID WC  . insulin aspart  0-20 Units Subcutaneous TID WC  . isosorbide-hydrALAZINE  1 tablet Oral TID  . loratadine  10 mg Oral Daily  . mycophenolate  540 mg Oral BID  . pantoprazole  40 mg Oral Daily  . predniSONE  2.5 mg Oral Q breakfast  . sodium chloride flush  3 mL Intravenous Q12H  . tacrolimus  3 mg Oral BID  . vitamin B-12  1,000 mcg Oral Daily     Claudia Desanctis MD 03/22/2020, 1:53 PM

## 2020-03-23 LAB — RENAL FUNCTION PANEL
Albumin: 2.5 g/dL — ABNORMAL LOW (ref 3.5–5.0)
Anion gap: 10 (ref 5–15)
BUN: 38 mg/dL — ABNORMAL HIGH (ref 6–20)
CO2: 22 mmol/L (ref 22–32)
Calcium: 8.4 mg/dL — ABNORMAL LOW (ref 8.9–10.3)
Chloride: 109 mmol/L (ref 98–111)
Creatinine, Ser: 2.59 mg/dL — ABNORMAL HIGH (ref 0.44–1.00)
GFR calc Af Amer: 23 mL/min — ABNORMAL LOW (ref >60)
GFR calc non Af Amer: 20 mL/min — ABNORMAL LOW (ref >60)
Glucose, Bld: 98 mg/dL (ref 70–99)
Phosphorus: 3.6 mg/dL (ref 2.5–4.6)
Potassium: 3.9 mmol/L (ref 3.5–5.1)
Sodium: 141 mmol/L (ref 135–145)

## 2020-03-23 LAB — CBC WITH DIFFERENTIAL/PLATELET
Abs Immature Granulocytes: 0.07 10*3/uL (ref 0.00–0.07)
Basophils Absolute: 0 10*3/uL (ref 0.0–0.1)
Basophils Relative: 0 %
Eosinophils Absolute: 0.1 10*3/uL (ref 0.0–0.5)
Eosinophils Relative: 2 %
HCT: 25.8 % — ABNORMAL LOW (ref 36.0–46.0)
Hemoglobin: 8.1 g/dL — ABNORMAL LOW (ref 12.0–15.0)
Immature Granulocytes: 1 %
Lymphocytes Relative: 12 %
Lymphs Abs: 0.7 10*3/uL (ref 0.7–4.0)
MCH: 29.7 pg (ref 26.0–34.0)
MCHC: 31.4 g/dL (ref 30.0–36.0)
MCV: 94.5 fL (ref 80.0–100.0)
Monocytes Absolute: 0.4 10*3/uL (ref 0.1–1.0)
Monocytes Relative: 8 %
Neutro Abs: 4.1 10*3/uL (ref 1.7–7.7)
Neutrophils Relative %: 77 %
Platelets: 212 10*3/uL (ref 150–400)
RBC: 2.73 MIL/uL — ABNORMAL LOW (ref 3.87–5.11)
RDW: 15.3 % (ref 11.5–15.5)
WBC: 5.4 10*3/uL (ref 4.0–10.5)
nRBC: 0 % (ref 0.0–0.2)

## 2020-03-23 LAB — TYPE AND SCREEN
ABO/RH(D): A POS
Antibody Screen: NEGATIVE
Unit division: 0

## 2020-03-23 LAB — GLUCOSE, CAPILLARY
Glucose-Capillary: 89 mg/dL (ref 70–99)
Glucose-Capillary: 102 mg/dL — ABNORMAL HIGH (ref 70–99)
Glucose-Capillary: 154 mg/dL — ABNORMAL HIGH (ref 70–99)
Glucose-Capillary: 141 mg/dL — ABNORMAL HIGH (ref 70–99)

## 2020-03-23 LAB — 25-HYDROXY VITAMIN D LCMS D2+D3
25-Hydroxy, Vitamin D-2: <1 ng/mL
25-Hydroxy, Vitamin D-3: 4 ng/mL
25-Hydroxy, Vitamin D: 4 ng/mL — ABNORMAL LOW

## 2020-03-23 LAB — TACROLIMUS LEVEL: Tacrolimus (FK506) - LabCorp: 1.6 ng/mL — ABNORMAL LOW (ref 2.0–20.0)

## 2020-03-23 LAB — BPAM RBC
Blood Product Expiration Date: 202110052359
ISSUE DATE / TIME: 202109151456
Unit Type and Rh: 6200

## 2020-03-23 MED ORDER — FUROSEMIDE 40 MG PO TABS
60.0000 mg | ORAL_TABLET | Freq: Two times a day (BID) | ORAL | Status: DC
  Administered 2020-03-23 – 2020-03-24 (×2): 60 mg via ORAL
  Filled 2020-03-23 (×2): qty 1

## 2020-03-23 MED ORDER — ATORVASTATIN CALCIUM 40 MG PO TABS
40.0000 mg | ORAL_TABLET | Freq: Every day | ORAL | Status: DC
  Administered 2020-03-24: 40 mg via ORAL
  Filled 2020-03-23: qty 1

## 2020-03-23 MED ORDER — FUROSEMIDE 10 MG/ML IJ SOLN
40.0000 mg | Freq: Once | INTRAMUSCULAR | Status: AC
  Administered 2020-03-23: 40 mg via INTRAVENOUS
  Filled 2020-03-23: qty 4

## 2020-03-23 MED ORDER — ENOXAPARIN SODIUM 120 MG/0.8ML ~~LOC~~ SOLN
1.0000 mg/kg | SUBCUTANEOUS | Status: DC
  Administered 2020-03-23 – 2020-03-24 (×2): 108 mg via SUBCUTANEOUS
  Filled 2020-03-23 (×3): qty 0.72

## 2020-03-23 NOTE — Progress Notes (Signed)
[facility] Progress Note    Assessment/ Plan:    38F transplant pt of Dr. Elissa Hefty coming in with vol overload, new CHF, Afib, RUE DVT   Assessment/Plan: 1.  Acute kidney injury in patient with chronic kidney disease stage III T: Baseline Cr is 1.7.  Suspect the current acute kidney injury may be hemodynamically mediated in the setting of decreased effective arterial blood volume/interstitial fluid and ongoing diuresis possibly cardiorenal syndrome as well.   - Improving with pausing diuretics.   Lasix 40 mg IV once today.  - Continue pred 2.5 mg daily, Myfortic 540 mg BID, tacrolimus 3 mg BID.  Level of 1.6 which was not a trough.  Repeat prograf trough before tonight's dose - ordered  2.  Acute hypoxic respiratory failure/volume overload/acute exacerbation of congestive heart failure.  Improved with lasix which was paused after rising Cr.   - S/p Empiric abx per primary team - azithro/ CTX.(now off)   3.  Anemia of chronic kidney disease:  iron studies with 9% tsat, allergic to IV iron.  On PO iron BID.  Transfused 1 unit PRBC's on 9/15.  aranesp 40 mcg as well once on 9/15.  4.  Hypertension: acceptable control   5.  Type 2 diabetes mellitus: per primary service.  6.  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  Evaluation for plasma cell dyscrasia undertaken by primary service (SPEP negative) but other malignancy may be playing a role.   7.  RLE pain/ swelling- venous doppler negative, CT scan 9/10 femur and pelvis showing R distal iliopsoas muscle enlargement, hemorrhage vs bursitis - felt bursitis on repeat imaging.    8.  New onset heart failure: TTE EF 25-30%.  Heart failure following. On BiDil.  Per CHF team, cardiomyopathy is more of ischemic nature and they recommended medical management with ASA, statin at this time. Diuretics as above  9.  Afib: on amio   10.  RUE  DVT: hep gtt--> lovenox was on hold for possible iliopsoas hemorrhage.  She has 2 indications to be on anticoagulation- RUE DVT and Afib. Team is reinitiating anticoagulation   Subjective:    She had 200 mL UOP over 9/15 documented.  She thinks she has had more than that.  300 mL in her purewick now.     Review of systems:  Shortness of breath with exertion still; no shortness of breath at rest  No chest pain No n/v     Objective:   BP (!) 143/76 (BP Location: Left Arm)   Pulse 66   Temp 98.5 F (36.9 C) (Oral)   Resp 18   Ht 5\' 11"  (1.803 m)   Wt 106.5 kg   LMP 11/05/2012   SpO2 97%   BMI 32.75 kg/m   Intake/Output Summary (Last 24 hours) at 03/23/2020 1124 Last data filed at 03/23/2020 2353 Gross per 24 hour  Intake 537.17 ml  Output 200 ml  Net 337.17 ml   Weight change: 1.3 kg  Physical Exam:    Gen: sitting in bed in NAD CVS: S1S2 no rub Resp: unlabored at rest but bibasilar crackles; room air Abd: nondistended; NT Ext:  trace to 1+ edema appreciated Psych normal mood and affect Neuro - alert and oriented x 3 provides hx and follows commands  Imaging: MR PELVIS WO CONTRAST  Result Date: 03/22/2020 CLINICAL DATA:  Followup abnormal CT scan. Iliopsoas process on the right noted. EXAM: MRI PELVIS WITHOUT CONTRAST  TECHNIQUE: Multiplanar multisequence MR imaging of the pelvis was performed. No intravenous contrast was administered. COMPARISON:  CT scan 03/17/2020 FINDINGS: Examination is severely limited by body habitus and patient motion. Urinary Tract:  The bladder is grossly normal. Bowel: Moderate stool distended rectum could suggest fecal impaction. Vascular/Lymphatic: No gross abnormality. Reproductive:  The uterus and ovaries are grossly normal. Other: Small amount of presacral edema and diffuse and severe body wall edema along with diffuse and severe myositis. Musculoskeletal: There are small bilateral hip joint effusions, right greater than left but I do not see  any destructive bony changes or marrow edema to suggest septic arthritis or osteomyelitis. The SI joints appear to be grossly intact and the pubic symphysis is intact. As demonstrated on the CT scan there is a large complex fluid collection associated with the right iliopsoas bursa. This is areas of dark T2 signal intensity which I think correlate with calcifications on the CT scan. Findings most likely severe chronic calcific bursitis. Difficult to identified the iliopsoas tendon for certain but I do not see any secondary signs that it is ruptured. IMPRESSION: 1. Severely limited examination due to body habitus and patient motion. 2. 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 3. No findings for septic arthritis or osteomyelitis. 4. Diffuse and severe myositis.  And severe diffuse body wall edema. Electronically Signed   By: Marijo Sanes M.D.   On: 03/22/2020 11:32   NM Myocar Multi W/Spect W/Wall Motion / EF  Result Date: 03/21/2020  There was no ST segment deviation noted during stress.  Defect 1: There is a large defect of severe severity present in the basal inferior, mid inferior and apical inferior location.  Defect 2: There is a medium defect of severe severity present in the basal anterior and mid anterior location.  Findings consistent with prior myocardial infarction with peri-infarct ischemia.  This is a high risk study.  Nuclear stress EF: 34%.  The left ventricular ejection fraction is moderately decreased (30-44%).  Abnormal, high risk stress nuclear study with prior anterior infarct with mild peri-infarct ischemia and prior inferior infarct with mild peri-infarct ischemia.  Gated ejection fraction 34% with global hypokinesis.  Moderate left ventricular enlargement.    Labs: BMET Recent Labs  Lab 03/17/20 0630 03/18/20 0205 03/19/20 0356 03/19/20 2334 03/21/20 0436 03/22/20 0343 03/23/20 0455  NA 140 141 141 139 142 142 141  K 3.3* 3.9 3.7 3.9 3.7  3.8 3.9  CL 109 110 109 107 109 108 109  CO2 20* 20* 22 19* 24 23 22   GLUCOSE 100* 102* 95 153* 109* 94 98  BUN 32* 33* 32* 33* 36* 37* 38*  CREATININE 2.52* 2.46* 2.52* 2.63* 2.75* 2.57* 2.59*  CALCIUM 7.4* 7.4* 7.6* 7.3* 7.8* 8.2* 8.4*  PHOS 4.0 3.8 3.6 3.0 3.8 3.5 3.6   CBC Recent Labs  Lab 03/19/20 2334 03/21/20 0436 03/22/20 0343 03/23/20 0455  WBC 5.0 5.3 5.2 5.4  NEUTROABS 3.6 3.9 3.8 4.1  HGB 8.0* 7.2* 7.6* 8.1*  HCT 24.3* 23.7* 24.7* 25.8*  MCV 92.4 94.8 95.7 94.5  PLT 192 192 202 212    Medications:    . atorvastatin  20 mg Oral Daily  . calcitRIOL  0.5 mcg Oral Daily  . calcium-vitamin D  1 tablet Oral BID  . DULoxetine  30 mg Oral Daily  . enoxaparin (LOVENOX) injection  1 mg/kg Subcutaneous Q24H  . ferrous sulfate  325 mg Oral BID WC  . hydrALAZINE  10 mg  Oral Q6H  . insulin aspart  0-20 Units Subcutaneous TID WC  . isosorbide mononitrate  15 mg Oral Daily  . loratadine  10 mg Oral Daily  . metoprolol succinate  25 mg Oral Daily  . mycophenolate  540 mg Oral BID  . pantoprazole  40 mg Oral Daily  . predniSONE  2.5 mg Oral Q breakfast  . sodium chloride flush  3 mL Intravenous Q12H  . tacrolimus  3 mg Oral BID  . vitamin B-12  1,000 mcg Oral Daily     Claudia Desanctis MD 03/23/2020, 11:41 AM

## 2020-03-23 NOTE — Progress Notes (Signed)
Subjective: [name] reports feeling quite well this morning, with no pain in her thigh and improved shortness of breath. She is enjoying the freedom afforded to her by being disconnected from the telemetry and oximetry and from having the [place] boots removed. She reports good urinary output over the last 24 hours, although she notes that it was slightly decreased from previous days.  Objective:  Vital signs in last 24 hours: Vitals:   03/22/20 1937 03/22/20 2354 03/23/20 0400 03/23/20 0500  BP: (!) 151/88 137/88 137/82   Pulse: 81 68 71   Resp: 20 16 17    Temp: 98.7 F (37.1 C) 98.2 F (36.8 C) 98.1 F (36.7 C)   TempSrc: Oral Oral Oral   SpO2: 96% 97% 97%   Weight:    106.5 kg  Height:       Weight change: 1.3 kg  Intake/Output Summary (Last 24 hours) at 03/23/2020 0703 Last data filed at 03/23/2020 6283 Gross per 24 hour  Intake 957.17 ml  Output 200 ml  Net 757.17 ml   No output recorded from 0701 from 1901, patient reports urinating during this time so unclear exactly how much she put out in 24 hours.  Physical Exam Constitutional:  Appearance: She is well-developed. She isobese.  HENT:  Head: Normocephalicand atraumatic.  Cardiovascular:  Rate and Rhythm: Normal rate.  Pulses: Normal pulses.  Pulmonary:  Effort: Pulmonary effort is normal.  Breath sounds:No rales. Mild wheezes. Abdominal:  General: Abdomen is flat. There is nodistension.  Musculoskeletal:  General: Swelling(dependent swelling around hips)and tenderness(right hip)present.  Cervical back: Normal range of motion.  Right lower leg: Moderate edemapresent.  Left lower leg: Mild edemapresent.  Skin: General: Skin is warmand dry.  Coloration: Skin is not pale.  Neurological:  General: No focal deficitpresent.  Mental Status: She is alertand oriented to person, place, and time.  ECG shows her in sinus rhythm with 1st degree AV block  and LBBB  Labs: Hgb 8.1 from 7.6 s/p 1 unit pRBCs, Glc 98 fasting, BUN 38 from 37, Cr 2.59 from 2.57, Ca 8.4 from 8.2 (corrected to 9.6 with albumin of 2.5).   Assessment/Plan: Principal Problem:   Heart failure with reduced ejection fraction (HCC) Active Problems:   HTN (hypertension)   Type II diabetes mellitus, well controlled ([place])   S/p cadaver renal transplant   Secondary hypoparathyroidism (HCC)   GERD (gastroesophageal reflux disease)   Vitamin B 12 deficiency   Respiratory failure (HCC)   Anemia of chronic disease   Persistent atrial fibrillation (HCC)   Chronic kidney disease, stage 3b  [name] is a 54 year old woman with a PMH significant for DMII, GERD, hypertension, CKD IIIb on transplanted kidney, and tobacco usewho presentedwith SOB, orthopnea, and anasarca, now found to have new diagnoses of ischemic HFrEF secondary to previous silent MI,atrial fibrillation with RVR, R axillary vein DVT due to 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  Right iliopsoas bursitis Thepain in herrightthigh is improving. MRI 9/15 revealed bursitis as the cause of the swelling in her right iliopsoas, not hemorrhage as previously suspected. -Dilaudid1 mg [id number] pain - PT/OT evaluation today recommended home health PT - Start 30 mg duloxetine daily  Ischemic HFrEF 2/2 previous silent MI Decreased urinary output over last 24 hours after stopping lasix. - Lasix60 mg PO BID to continue diuresi -Hydralazine 10 mg q6, imdur 24 hr tablet 15 mg daily - Start metoprolol succinate 25 mg daily 9/16  Atrial  fibrillation with RVR CHA2DS2-VAScScore = 4(1 point each for HTN, DM, vascular disease, and gender). This morning's ECG shows her in sinus rhythm with 1st degree AV block and LBBB, encouraging for resolution of her previously persistent atrial fibrillation. -Metoprolol 25 mg daily - Bridge to warfarin with  lovenox, with possibility of future DOAC transition  CAD, previous MI: Nuclear study 9/14showed no ST segment deviationbut severe defects consistent with prior MI. Will risk stratify in order to avoid future MI:  - HTN is controlled here, but uncontrolled previous to hospitalization. - Lipid panel WNL, but will increase atorvastatinto 40 mg daily - DM well controlled on insulin outpatient (A1c 5.1), will start SGLT2 inhibitor by discharge - CKD followed by nephrology here and outpatient - Smoking cessation counseling provided, she is committed to stopping - Diet and exercise: Nutrition counseling in our outpatient clinic after discharge - Metoprolol 25 mg daily   Right axillary vein DVT 2/2 ATIII deficiency 2/2 nephrotic syndrome Held enoxaparin due to concern for iliopsoas hemorrhage, but we are resuming her on anticoagulation given that this was found to be bursitis.The increase in her antithrombin IIIlevel from 57 to 72% indicates that at least part of her deficiency is well explained by heparin administration, although part of it is likely due to loss of ATIII in the urine due to her nephrotic syndrome. - Bridge to warfarin with lovenox 1 mg/kg per pharmacy consult, with possibility of future DOAC transition  Diuretic-induced AKI on CKDIIIb in transplanted kidney Creatinine and BUN barely up today (BUN 38 from 37, Cr 2.59 from 2.57) and decreased urine output after stopping lasix. 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 Low Ca, normal phos, longstanding elevated PTH, bony lesions and elevated alkaline phosphatase  consistent with renal osteodystrophy caused by secondary hyperparathyroidism.Calcium improving on supplementation. -Pending 25-hydroxy vitamin D level -Calcitriol0.5 mcg daily -Calcium-vitamin D 500-200 mgBID  Anemia of chronic disease Hgb improved to 8.1 today from 7.6 yesterday after a unit of PRBCs.  - Vitamin B12 levellow, supplementingwith 1000 mcg B12 daily - 325 mg ferrous sulfate BID  DMII SSI Novolog resistant while inpatient - Consider future SGLT2 inhibitor pending GFR  Rhinorrhea and wheezing -Loratadine 10 mg daily - Guaifenesin-dextromethorphan 100-10mg prn - Albuterol PRN  Diet:Heart Healthy OVA:NVBT,YOMA VTE: Lovenox 1mg /kg Code:Full PT/OT recs:Home health PT TOC recs:pending   LOS: 9 days   [name], Medical Student 03/23/2020, 7:03 AM

## 2020-03-23 NOTE — Evaluation (Signed)
Physical Therapy Evaluation Patient Details Name: [name] MRN: [id number] DOB: 09-24-1965 Today's Date: 03/23/2020   History of Present Illness  Pt is 54 yo female with PMH of HTN, DM2, CAD, CKD s/p renal transplant in 2011, and tobacco abuse.  Pt was admitted with systolic heart failure and afib.  She had R thigh pain and swelling - MRI revealed bursitis as the cause of pain and swelling of R iliopsoas.  Pt does have DVT R axillary vein with anticoagulation.  Clinical Impression  Pt admitted with above diagnosis. She was able to transfer and ambulate with RW and min A but was limited by c/o R thigh pain.  Pt's VSS on RA.  Demonstrated good safety awareness and use of DME.  Pt normally independent and has support for several family members at home.   Pt currently with functional limitations due to the deficits listed below (see PT Problem List). Pt will benefit from skilled PT to increase their independence and safety with mobility to allow discharge to the venue listed below.       Follow Up Recommendations Home health PT;Supervision for mobility/OOB    Equipment Recommendations  Rolling walker with 5" wheels;3in1 (PT)    Recommendations for Other Services       Precautions / Restrictions Precautions Precautions: Fall      Mobility  Bed Mobility Overal bed mobility: Needs Assistance Bed Mobility: Supine to Sit     Supine to sit: Min assist     General bed mobility comments: min A for R LE due to pain  Transfers Overall transfer level: Needs assistance Equipment used: Rolling walker (2 wheeled) Transfers: Sit to/from Stand Sit to Stand: Min assist         General transfer comment: bed elevated, cues for hand placement  Ambulation/Gait Ambulation/Gait assistance: Min guard Gait Distance (Feet): 18 Feet Assistive device: Rolling walker (2 wheeled) Gait Pattern/deviations: Step-to pattern;Decreased stride length Gait velocity: decreased   General Gait  Details: Pt ambulated initially with stepping L leg first step to pattern; had her attempt R leg first step to - reports slight improvement in shooting pain but still present  Stairs            Wheelchair Mobility    Modified Rankin (Stroke Patients Only)       Balance Overall balance assessment: Needs assistance Sitting-balance support: No upper extremity supported Sitting balance-Leahy Scale: Good     Standing balance support: Bilateral upper extremity supported;No upper extremity supported Standing balance-Leahy Scale: Fair Standing balance comment: statice without UE support but used RW for ambulation                             Pertinent Vitals/Pain Pain Assessment: Faces Faces Pain Scale: Hurts a little bit Pain Location: R thigh with ambulation Pain Descriptors / Indicators: Shooting Pain Intervention(s): Limited activity within patient's tolerance;Monitored during session;Repositioned    Home Living Family/patient expects to be discharged to:: Private residence Living Arrangements: Children (daughter staying with her) Available Help at Discharge: Family;Available 24 hours/day Type of Home: Apartment Home Access: Level entry     Home Layout: Two level;Able to live on main level with bedroom/bathroom Home Equipment: Kasandra Knudsen - single point;Shower seat;Grab bars - tub/shower      Prior Function Level of Independence: Independent         Comments: Independent with IADLs, ADLS, and community ambulation     Hand Dominance  Extremity/Trunk Assessment   Upper Extremity Assessment Upper Extremity Assessment: Overall WFL for tasks assessed    Lower Extremity Assessment Lower Extremity Assessment: RLE deficits/detail;Overall WFL for tasks assessed RLE Deficits / Details: R thigh with edema but MMT of knee ext and ankle DF 5/5; did not MMT R thigh due to pain; ROM WFL       Communication   Communication: No difficulties  Cognition  Arousal/Alertness: Awake/alert Behavior During Therapy: WFL for tasks assessed/performed Overall Cognitive Status: Within Functional Limits for tasks assessed                                        General Comments General comments (skin integrity, edema, etc.): VSS    Exercises     Assessment/Plan    PT Assessment Patient needs continued PT services  PT Problem List Decreased strength;Decreased mobility;Decreased range of motion;Decreased coordination;Decreased activity tolerance;Cardiopulmonary status limiting activity;Decreased balance;Decreased knowledge of use of DME;Pain       PT Treatment Interventions DME instruction;Therapeutic activities;Gait training;Modalities;Therapeutic exercise;Patient/family education;Stair training;Balance training;Functional mobility training    PT Goals (Current goals can be found in the Care Plan section)  Acute Rehab PT Goals Patient Stated Goal: decrease pain PT Goal Formulation: With patient Time For Goal Achievement: 04/06/20 Potential to Achieve Goals: Good    Frequency Min 3X/week   Barriers to discharge        Co-evaluation               AM-PAC PT "6 Clicks" Mobility  Outcome Measure Help needed turning from your back to your side while in a flat bed without using bedrails?: A Little Help needed moving from lying on your back to sitting on the side of a flat bed without using bedrails?: A Little Help needed moving to and from a bed to a chair (including a wheelchair)?: A Little Help needed standing up from a chair using your arms (e.g., wheelchair or bedside chair)?: A Little Help needed to walk in hospital room?: A Little Help needed climbing 3-5 steps with a railing? : A Little 6 Click Score: 18    End of Session Equipment Utilized During Treatment: Gait belt Activity Tolerance: Patient tolerated treatment well Patient left: in chair;with call bell/phone within reach Nurse Communication: Mobility  status PT Visit Diagnosis: Other abnormalities of gait and mobility (R26.89);Pain;Muscle weakness (generalized) (M62.81) Pain - Right/Left: Right Pain - part of body: Hip    Time: 1233-1259 PT Time Calculation (min) (ACUTE ONLY): 26 min   Charges:   PT Evaluation $PT Eval Low Complexity: 1 Low PT Treatments $Gait Training: 8-22 mins        Abran Richard, PT Acute Rehab Services Pager [phone] [facility] 03/23/2020, 1:47 PM

## 2020-03-23 NOTE — Progress Notes (Signed)
ANTICOAGULATION CONSULT NOTE - Initial Consult  Pharmacy Consult for Lovenox Indication: atrial fibrillation and DVT  Allergies  Allergen Reactions  . Infed [Iron Dextran] Other (See Comments)    Pt has chest pain  Chest pain  . Cefazolin Itching and Nausea And Vomiting  . Ancef [Cefazolin Sodium] Other (See Comments)    Tolerated rocephin 11/03/10 Reports ancef causes itching, sweating, vomiting (x2-2009?)  . Lisinopril Swelling  . Other     Pt may be allergic to another antibiotic daughter isn't sure and pt unable to verify.    Patient Measurements: Height: 5\' 11"  (180.3 cm) Weight: 106.5 kg (234 lb 12.6 oz) IBW/kg (Calculated) : 70.8  Vital Signs: Temp: 98.1 F (36.7 C) (09/16 0400) Temp Source: Oral (09/16 0400) BP: 137/82 (09/16 0400) Pulse Rate: 71 (09/16 0400)  Labs: Recent Labs    03/21/20 0436 03/21/20 0436 03/22/20 0343 03/23/20 0455  HGB 7.2*   < > 7.6* 8.1*  HCT 23.7*  --  24.7* 25.8*  PLT 192  --  202 212  CREATININE 2.75*  --  2.57* 2.59*   < > = values in this interval not displayed.    Estimated Creatinine Clearance: 32.6 mL/min (A) (by C-G formula based on SCr of 2.59 mg/dL (H)).   Medical History: Past Medical History:  Diagnosis Date  . Blood transfusion without reported diagnosis   . 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 GERD (gastroesophageal reflux disease)    medication induced  . Hypertension   . Renal failure    hx of  . 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 amLODipine (NORVASC) 10 MG tablet Take 10 mg by mouth daily.   03/13/2020 at Unknown time  .  atorvastatin (LIPITOR) 10 MG tablet Take 10 mg by mouth 3 (three) times a week. Taking on Monday, Wednesday, Friday.   03/13/2020 at Unknown time  . calcitRIOL (ROCALTROL) 0.5 MCG capsule Take 1.5 mcg by mouth daily.   03/13/2020 at Unknown time  . Calcium-Phosphorus-Vitamin D (CITRACAL +D3 PO) Take 1 tablet by mouth every Monday, Wednesday, and Friday. Mon, Wed, Friday nights only    03/10/2020  . DULoxetine (CYMBALTA) 30 MG capsule TAKE 1 CAPSULE(30 MG) BY MOUTH DAILY (Patient taking differently: Take 30 mg by mouth daily. ) 30 capsule 5 03/12/2020  . furosemide (LASIX) 40 MG tablet Take 1 tablet (40 mg total) by mouth daily. 14 tablet 0 03/12/2020  . insulin aspart (NOVOLOG) 100 UNIT/ML injection Inject 8-14 Units into the skin as needed for high blood sugar.    Past Month at Unknown time  . labetalol (NORMODYNE) 200 MG tablet Take 1 tablet (200 mg total) by mouth 2 (two) times daily. 60 tablet 2 03/12/2020 at 2000  . magnesium oxide (MAG-OX) 400 MG tablet Take 400 mg by mouth 2 (two) times daily.   03/12/2020  . mycophenolate (MYFORTIC) 180 MG EC tablet Take 3 tablets (540 mg total) by mouth 2 (two) times daily. 90 tablet  03/12/2020  . omeprazole (PRILOSEC) 40 MG capsule Take 40 mg by mouth daily.   03/12/2020  . predniSONE (DELTASONE) 2.5  MG tablet Take 2.5 mg by mouth daily.   03/12/2020  . PROGRAF 1 MG capsule Take 3 mg by mouth 2 (two) times daily.   5 03/12/2020  . vitamin B-12 (CYANOCOBALAMIN) 1000 MCG tablet Take 1 tablet (1,000 mcg total) by mouth daily. 30 tablet 3 03/12/2020  . traMADol (ULTRAM) 50 MG tablet Take 1 tablet (50 mg total) by mouth every 6 (six) hours as needed. (Patient not taking: Reported on 03/14/2020) 20 tablet 0 Not Taking at Unknown time   Scheduled:  . atorvastatin  20 mg Oral Daily  . calcitRIOL  0.5 mcg Oral Daily  . calcium-vitamin D  1 tablet Oral BID  . DULoxetine  30 mg Oral Daily  . ferrous sulfate  325 mg Oral BID WC  . hydrALAZINE  10 mg Oral Q6H  . insulin aspart  0-20 Units  Subcutaneous TID WC  . isosorbide mononitrate  15 mg Oral Daily  . loratadine  10 mg Oral Daily  . metoprolol succinate  25 mg Oral Daily  . mycophenolate  540 mg Oral BID  . pantoprazole  40 mg Oral Daily  . predniSONE  2.5 mg Oral Q breakfast  . sodium chloride flush  3 mL Intravenous Q12H  . tacrolimus  3 mg Oral BID  . vitamin B-12  1,000 mcg Oral Daily    Assessment: 54yo female admitted w/ multiple problems, started on anticoagulation for Afib + DVT but held since 9/11 d/t bleeding, now to resume LMWH while bridging to oral therapy.  Goal of Therapy:  Anti-Xa level 0.6-1 units/ml 4hrs after LMWH dose given Monitor platelets by anticoagulation protocol: Yes   Plan:  Pt had therapeutic LMWH level on Lovenox 120mg  SQ Q12H but then had bleeding complications; renal function at borderline for Q24 vs Q12 dosing so will start Lovenox 1 mg/kg Q24H for now and check level.  Wynona Neat, PharmD, BCPS  03/23/2020,7:14 AM

## 2020-03-24 ENCOUNTER — Telehealth: Payer: Self-pay | Admitting: Internal Medicine

## 2020-03-24 ENCOUNTER — Encounter: Payer: Self-pay | Admitting: Internal Medicine

## 2020-03-24 ENCOUNTER — Inpatient Hospital Stay: Payer: Medicare Other

## 2020-03-24 DIAGNOSIS — Z23 Encounter for immunization: Secondary | ICD-10-CM

## 2020-03-24 LAB — CBC
HCT: 27.2 % — ABNORMAL LOW (ref 36.0–46.0)
Hemoglobin: 8.4 g/dL — ABNORMAL LOW (ref 12.0–15.0)
MCH: 29.3 pg (ref 26.0–34.0)
MCHC: 30.9 g/dL (ref 30.0–36.0)
MCV: 94.8 fL (ref 80.0–100.0)
Platelets: 225 10*3/uL (ref 150–400)
RBC: 2.87 MIL/uL — ABNORMAL LOW (ref 3.87–5.11)
RDW: 15.3 % (ref 11.5–15.5)
WBC: 5.1 10*3/uL (ref 4.0–10.5)
nRBC: 0 % (ref 0.0–0.2)

## 2020-03-24 LAB — RENAL FUNCTION PANEL
Albumin: 2.5 g/dL — ABNORMAL LOW (ref 3.5–5.0)
Anion gap: 10 (ref 5–15)
BUN: 37 mg/dL — ABNORMAL HIGH (ref 6–20)
CO2: 22 mmol/L (ref 22–32)
Calcium: 8 mg/dL — ABNORMAL LOW (ref 8.9–10.3)
Chloride: 106 mmol/L (ref 98–111)
Creatinine, Ser: 2.54 mg/dL — ABNORMAL HIGH (ref 0.44–1.00)
GFR calc Af Amer: 24 mL/min — ABNORMAL LOW (ref >60)
GFR calc non Af Amer: 20 mL/min — ABNORMAL LOW (ref >60)
Glucose, Bld: 127 mg/dL — ABNORMAL HIGH (ref 70–99)
Phosphorus: 3.4 mg/dL (ref 2.5–4.6)
Potassium: 3.7 mmol/L (ref 3.5–5.1)
Sodium: 138 mmol/L (ref 135–145)

## 2020-03-24 LAB — GLUCOSE, CAPILLARY
Glucose-Capillary: 92 mg/dL (ref 70–99)
Glucose-Capillary: 122 mg/dL — ABNORMAL HIGH (ref 70–99)
Glucose-Capillary: 160 mg/dL — ABNORMAL HIGH (ref 70–99)

## 2020-03-24 LAB — PROTIME-INR
INR: 1.2 (ref 0.8–1.2)
Prothrombin Time: 14.7 seconds (ref 11.4–15.2)

## 2020-03-24 MED ORDER — HYDRALAZINE HCL 10 MG PO TABS
10.0000 mg | ORAL_TABLET | Freq: Three times a day (TID) | ORAL | 3 refills | Status: DC
Start: 2020-03-24 — End: 2020-07-20

## 2020-03-24 MED ORDER — MYCOPHENOLATE SODIUM 180 MG PO TBEC
540.0000 mg | DELAYED_RELEASE_TABLET | Freq: Two times a day (BID) | ORAL | 1 refills | Status: DC
Start: 2020-03-24 — End: 2020-03-29

## 2020-03-24 MED ORDER — TACROLIMUS 1 MG PO CAPS: 3.0000 mg | ORAL_CAPSULE | Freq: Two times a day (BID) | ORAL | 3 refills | Status: DC

## 2020-03-24 MED ORDER — FUROSEMIDE 80 MG PO TABS: 80.0000 mg | ORAL_TABLET | Freq: Every day | ORAL | 11 refills | Status: DC

## 2020-03-24 MED ORDER — ISOSORBIDE MONONITRATE ER 30 MG PO TB24
15.0000 mg | ORAL_TABLET | Freq: Every day | ORAL | 3 refills | Status: DC
Start: 2020-03-25 — End: 2020-07-20

## 2020-03-24 MED ORDER — DULOXETINE HCL 30 MG PO CPEP: 30.0000 mg | ORAL_CAPSULE | Freq: Every day | ORAL | 3 refills | Status: DC

## 2020-03-24 MED ORDER — WARFARIN SODIUM 5 MG PO TABS
5.0000 mg | ORAL_TABLET | Freq: Once | ORAL | Status: AC
  Administered 2020-03-24: 5 mg via ORAL
  Filled 2020-03-24: qty 1

## 2020-03-24 MED ORDER — WARFARIN VIDEO: Freq: Once | Status: DC

## 2020-03-24 MED ORDER — INSULIN ASPART 100 UNIT/ML ~~LOC~~ SOLN
0.0000 [IU] | Freq: Three times a day (TID) | SUBCUTANEOUS | 11 refills | Status: AC
Start: 2020-03-24 — End: ?

## 2020-03-24 MED ORDER — LORATADINE 10 MG PO TABS: 10.0000 mg | ORAL_TABLET | Freq: Every day | ORAL | 3 refills | Status: AC

## 2020-03-24 MED ORDER — CALCIUM CARBONATE-VITAMIN D 500-200 MG-UNIT PO TABS: 1.0000 | ORAL_TABLET | Freq: Two times a day (BID) | ORAL | 3 refills | Status: DC

## 2020-03-24 MED ORDER — ALBUTEROL SULFATE (2.5 MG/3ML) 0.083% IN NEBU: 2.5000 mg | INHALATION_SOLUTION | Freq: Four times a day (QID) | RESPIRATORY_TRACT | 12 refills | Status: AC | PRN

## 2020-03-24 MED ORDER — ACETAMINOPHEN 325 MG PO TABS: 650.0000 mg | ORAL_TABLET | Freq: Four times a day (QID) | ORAL | 1 refills | Status: AC | PRN

## 2020-03-24 MED ORDER — PREDNISONE 2.5 MG PO TABS
2.5000 mg | ORAL_TABLET | Freq: Every day | ORAL | 3 refills | Status: DC
Start: 2020-03-25 — End: 2020-07-20

## 2020-03-24 MED ORDER — FERROUS SULFATE 325 (65 FE) MG PO TABS: 325.0000 mg | ORAL_TABLET | Freq: Two times a day (BID) | ORAL | 3 refills | Status: AC

## 2020-03-24 MED ORDER — GUAIFENESIN-DM 100-10 MG/5ML PO SYRP: 5.0000 mL | ORAL_SOLUTION | ORAL | 0 refills | Status: AC | PRN

## 2020-03-24 MED ORDER — PANTOPRAZOLE SODIUM 40 MG PO TBEC
40.0000 mg | DELAYED_RELEASE_TABLET | Freq: Every day | ORAL | 3 refills | Status: DC
Start: 2020-03-25 — End: 2020-07-05

## 2020-03-24 MED ORDER — METOPROLOL SUCCINATE ER 25 MG PO TB24: 25.0000 mg | ORAL_TABLET | Freq: Every day | ORAL | 3 refills | Status: AC

## 2020-03-24 MED ORDER — ATORVASTATIN CALCIUM 40 MG PO TABS
40.0000 mg | ORAL_TABLET | Freq: Every day | ORAL | 3 refills | Status: AC
Start: 2020-03-25 — End: ?

## 2020-03-24 MED ORDER — WARFARIN VIDEO: 1.0000 | Freq: Once | 0 refills | Status: AC

## 2020-03-24 MED ORDER — COUMADIN BOOK: 1.0000 | Freq: Once | 0 refills | Status: AC

## 2020-03-24 MED ORDER — COUMADIN BOOK
Freq: Once | Status: DC
  Filled 2020-03-24: qty 1

## 2020-03-24 MED ORDER — WARFARIN - PHARMACIST DOSING INPATIENT: Freq: Every day | Status: DC

## 2020-03-24 MED ORDER — HYDRALAZINE HCL 10 MG PO TABS
10.0000 mg | ORAL_TABLET | Freq: Three times a day (TID) | ORAL | Status: DC
  Administered 2020-03-24: 10 mg via ORAL
  Filled 2020-03-24: qty 1

## 2020-03-24 MED ORDER — CALCITRIOL 0.5 MCG PO CAPS
0.5000 ug | ORAL_CAPSULE | Freq: Every day | ORAL | 3 refills | Status: DC
Start: 2020-03-25 — End: 2020-08-06

## 2020-03-24 NOTE — TOC Transition Note (Signed)
Transition of Care [facility]) - CM/SW Discharge Note   Patient Details  Name: [name] MRN: [id number] Date of Birth: May 17, 1966  Transition of Care [facility]) CM/SW Contact:  Hyman Hopes, RN Phone Number: 03/24/2020, 12:25 PM   Clinical Narrative:    Case manager noted recommendations from Physical therapy for home health PT, rolling walker and 3 in 1.  Case manager called [facility] for DME needs and called [facility] for home PT.  Spoke to patient in regards to needs, has no equipment at home, uses public transportation, no issues with affording medications, and able to get to MD appointments as necessary.    Final next level of care: [place] Barriers to Discharge: No Barriers Identified   Patient Goals and CMS Choice Patient states their goals for this hospitalization and ongoing recovery are:: to go home with home health. CMS Medicare.gov Compare Post Acute Care list provided to:: Patient Choice offered to / list presented to : Patient  Discharge Placement                       Discharge Plan and Services   Discharge Planning Services: CM Consult Post Acute Care Choice: Durable Medical Equipment, Home Health          DME Arranged: 3-N-1, Walker rolling DME Agency: [employer] Date DME Agency Contacted: 03/24/20 Time DME Agency Contacted: 1223 Representative spoke with at DME Agency: [place]: PT [place]: [place] Date [place]: 03/24/20 Time [place]: 1223 Representative spoke with at [place]: [place]) Interventions     Readmission Risk Interventions No flowsheet data found.

## 2020-03-24 NOTE — Progress Notes (Signed)
Subjective: [name] is doing great this morning, with no pain in her leg or discomfort in her breathing. She is eager to leave as soon as possible although understands the need to be stable for discharge. She reports being able to navigate well between the chair and the bed and feels comfortable about her ability to be mobile at home. She would like to receive her [place] vaccine today.  Objective:  Vital signs in last 24 hours: Vitals:   03/23/20 1643 03/23/20 2007 03/23/20 2354 03/24/20 0529  BP: 133/76 134/74 127/77 140/72  Pulse: 65 68 66 65  Resp: 19 17 14 14   Temp: 98.4 F (36.9 C) 98.3 F (36.8 C) 98.2 F (36.8 C) 98.3 F (36.8 C)  TempSrc: Oral Oral Oral Oral  SpO2: 99% 100% 98% 98%  Weight:    104.7 kg  Height:       Weight change: -1.8 kg  Intake/Output Summary (Last 24 hours) at 03/24/2020 0737 Last data filed at 03/24/2020 0534 Gross per 24 hour  Intake 343 ml  Output 1500 ml  Net -1157 ml   Physical Exam Constitutional:  Appearance: She is well-developed, comfortable, and well-appearing. She isobese.  HENT:  Head: Normocephalicand atraumatic.  Cardiovascular:  Rate and Rhythm: Normal rate.  Pulses: Normal pulses.  Pulmonary:  Effort: Pulmonary effort is normal.  Breath sounds:No rales. Mildwheezes. Abdominal:  General: Abdomen is flat. There is nodistension.  Musculoskeletal:  General: Swelling(dependent swelling around hips)and tenderness(right hip)present.  Cervical back: Normal range of motion.  Right lower leg:Moderate edemapresent.  Left lower TWS:FKCL edemapresent.  Skin: General: Skin is warmand dry.  Coloration: Skin is not pale.  Neurological:  General: No focal deficitpresent.  Mental Status: She is alertand oriented to person, place, and time.  Labs: PT 14.7, INR 1.2, glc 122, Hgb 8.4, BUN 37 from 38, Cr 2.54 from 2.59, Ca 8.0, Alb  2.5  Assessment/Plan: Principal Problem:   Heart failure with reduced ejection fraction (HCC) Active Problems:   HTN (hypertension)   Type II diabetes mellitus, well controlled ([place])   S/p cadaver renal transplant   Secondary hypoparathyroidism (HCC)   GERD (gastroesophageal reflux disease)   Vitamin B 12 deficiency   Respiratory failure (HCC)   Anemia of chronic disease   Persistent atrial fibrillation ([place])   Chronic kidney disease, stage 3b  [name] is a 54 year old woman with a PMH significant for DMII, GERD, hypertension, CKDIIIb on transplanted kidney, and tobacco usewho presentedwith SOB, orthopnea, and anasarca,nowfound to have new diagnoses ofischemicHFrEF secondary toprevioussilentMI,atrial fibrillation with RVR, 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  Rightiliopsoas bursitis Thepain in herrightthigh is much better, although can be worsened with some movements. I suspect this pain will continue to improve with gradual and appropriate increase in activity. -Dilaudid1 mg POPRNq4hrsfor pain - PT/OT recommended home health PT - Will need rigid walker for home mobility, ordered - 30 mg duloxetine daily  IschemicHFrEF 2/2 previous silent MI Decreased urinary output over last 24 hours after stopping lasix. - Lasix60 mg PO BID to continue diuresis at home -Hydralazine 10 mg TID, imdur 24 hr tablet 15 mg daily -Started metoprololsuccinate 25 mg daily 9/16 - Cannot start SGLT-2 inhibitors given poor kidney function  Atrial fibrillation with RVR CHA2DS2-VAScScore = 4(1 point each for HTN, DM, vascular disease, and gender). Yesterday's ECG shows her in sinus rhythm with 1st degree AV block and LBBB, encouraging for resolution of her  previously persistent atrial fibrillation. -Metoprolol 25 mg daily - Bridge to warfarin with lovenox per pharmacy consult, with  possibility of future DOAC transition  CAD, previous MI: Nuclear study9/14showedno ST segment deviationbut severe defects consistent with prior MI. Risk stratification in order to avoid future MI:  - HTN is controlled here, but uncontrolled previous to hospitalization. Will manage outpatient. 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 - Bridge to warfarin with lovenox 1 mg/kg per pharmacy consult, with possibility of future DOAC transition  Diuretic-induced AKI on CKDIIIb in transplanted kidney Creatinine and BUN barely up today (BUN 38 from 37, Cr 2.59 from 2.57) and decreased urine output after stopping lasix briefly. 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 Low Ca, normal phos, longstanding elevated PTH,bony lesions and elevated alkaline  phosphataseconsistent with renal osteodystrophy caused bysecondary hyperparathyroidism.Calcium improving on supplementation. -Pending 25-hydroxy vitamin D level -Calcitriol0.5 mcg daily -Calcium-vitamin D 500-200 mgBID  Anemia of chronic disease Hgb 8.4, rising. - Vitamin B12 levellow, supplementingwith 1000 mcg B12 daily - 325 mg ferrous sulfate BID  DMII SSI Novolog resistant while inpatient  Rhinorrhea and wheezing -Loratadine 10 mg daily - Guaifenesin-dextromethorphan 100-10mg prn - Albuterol PRN  Diet:Heart Healthy DEY:CXKG,YJEH VTE: Lovenox 1mg /kg, bridging to warfarin Code:Full PT/OT recs:Home health PT TOC recs:pending   LOS: 10 days   Mardella Layman, Medical Student 03/24/2020, 7:37 AM

## 2020-03-24 NOTE — Telephone Encounter (Signed)
Acknowledged, thank you.

## 2020-03-24 NOTE — Progress Notes (Signed)
Pt discharged via  Wheel chair with beside commode and front wheel walker dc instructions given and pt stated understanding assited in personal car

## 2020-03-24 NOTE — Consult Note (Signed)
   [facility] Inpatient Consult   03/24/2020  [name] 1966-01-24 [id number]   [place] [facility] [ACO] Patient:  Medicare NextGen   Patient screened for high risk score for unplanned readmission score and long length of stay hospitalization.   Review of patient's medical record reveals patient is a patient in the [name] listed with Dr. Molli Hazard, DO.   Spoke with patient via hospital telephone.  Patient confirms primary care provider.  Spoke with patient regarding the Embedded Chronic Care Management team at the facility.  She states she may be interested.  Patient was gracious.  Plan:  Follow up with the Embedded Chronic Care Management team to alert of this patient for potential chronic care management for HF.     For questions contact:   Natividad Brood, RN BSN [facility] Liaison  [phone] business mobile phone Toll free office [phone]  Fax number: [phone] [email or web address]

## 2020-03-24 NOTE — Telephone Encounter (Signed)
-----   Message from Mardella Layman, Medical Student sent at 03/24/2020  2:48 PM EDT ----- Regarding: Outpatient Follow-Up Appointment Ms. Sienkiewicz needs an appointment to see an outpatient provider next week, preferably on Monday if possible. She needs follow-up for her anticoagulation and diuresis.  Thanks, Filiberto Pinks

## 2020-03-24 NOTE — Progress Notes (Signed)
[facility] Progress Note    Assessment/ Plan:    37F transplant pt of Dr. Elissa Hefty coming in with vol overload, new CHF, Afib, RUE DVT   Assessment/Plan: 1.  Acute kidney injury in patient with chronic kidney disease stage III T: Baseline Cr is 1.7.  Suspect the current acute kidney injury may be hemodynamically mediated in the setting of decreased effective arterial blood volume/interstitial fluid and ongoing diuresis possibly cardiorenal syndrome as well.   - Acceptable for discharge on lasix 80 mg daily (had been on lasix 40 mg daily before admission and states had been told to increase to 80 mg but ended up presenting to the ER so she's never tried this dose) - Continue pred 2.5 mg daily, Myfortic 540 mg BID, tacrolimus 3 mg BID.  Level of 1.6 which was not a trough.  Repeat prograf trough before 9/16 pm dose is in process and pt states was trough  2.  Acute hypoxic respiratory failure/volume overload/acute exacerbation of congestive heart failure.  Improved with lasix which was paused after rising Cr.   - S/p Empiric abx per primary team - azithro/ CTX.(now off)   3.  Anemia of chronic kidney disease:  iron studies with 9% tsat, allergic to IV iron.  On PO iron BID.  Transfused 1 unit PRBC's on 9/15.  aranesp 40 mcg as well once on 9/15.    4.  Hypertension: acceptable control   5.  Type 2 diabetes mellitus: per primary service.  6.  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  Evaluation for plasma cell dyscrasia undertaken by primary service (SPEP negative) but other malignancy may be playing a role.   7.  RLE pain/ swelling- venous doppler negative, CT scan 9/10 femur and pelvis showing R distal iliopsoas muscle enlargement, hemorrhage vs bursitis - felt bursitis on repeat imaging.    8.  New onset heart failure: TTE EF 25-30%.  Heart failure following. On BiDil.  Per CHF  team, cardiomyopathy is more of ischemic nature and they recommended medical management with ASA, statin at this time. Diuretics as above  9.  Afib: on amio   10.  RUE DVT: hep gtt--> lovenox was on hold for possible iliopsoas hemorrhage.  She has 2 indications to be on anticoagulation- RUE DVT and Afib. Team reinitiated anticoagulation   Disposition:  Acceptable for discharge home from a renal standpoint.  Will set up follow-up appt with [facility] in one week with Dr. Marval Regal or [facility] clinic.     Subjective:    She had 1.5 liters UOP over 9/16.  Feels good and states she's ready to go home.  Has been getting around room ok and has medical equipment to help her at home that has arrived -walker, etc.  In the interim she was started on lasix 60 mg BID.    Review of systems:    Shortness of breath resolved per pt  No chest pain No n/v     Objective:   BP 134/81 (BP Location: Left Arm)   Pulse 76   Temp 98 F (36.7 C) (Oral)   Resp 18   Ht 5\' 11"  (1.803 m)   Wt 104.7 kg   LMP 11/05/2012   SpO2 100%   BMI 32.19 kg/m   Intake/Output Summary (Last 24 hours) at 03/24/2020 1501 Last data filed at 03/24/2020 0534 Gross per 24 hour  Intake 340 ml  Output 1500 ml  Net -1160  ml   Weight change: -1.8 kg  Physical Exam:    Gen: sitting in bed in NAD  CVS: S1S2 no rub Resp: unlabored at rest; bibasilar crackles; room air Abd: nondistended; NT Ext:  1+ edema appreciated Psych normal mood and affect Neuro - alert and oriented x 3 provides hx and follows commands  Imaging: No results found.  Labs: BMET Recent Labs  Lab 03/18/20 0205 03/19/20 0356 03/19/20 2334 03/21/20 0436 03/22/20 0343 03/23/20 0455 03/24/20 0833  NA 141 141 139 142 142 141 138  K 3.9 3.7 3.9 3.7 3.8 3.9 3.7  CL 110 109 107 109 108 109 106  CO2 20* 22 19* 24 23 22 22   GLUCOSE 102* 95 153* 109* 94 98 127*  BUN 33* 32* 33* 36* 37* 38* 37*  CREATININE 2.46* 2.52* 2.63* 2.75* 2.57* 2.59* 2.54*   CALCIUM 7.4* 7.6* 7.3* 7.8* 8.2* 8.4* 8.0*  PHOS 3.8 3.6 3.0 3.8 3.5 3.6 3.4   CBC Recent Labs  Lab 03/19/20 2334 03/19/20 2334 03/21/20 0436 03/22/20 0343 03/23/20 0455 03/24/20 0833  WBC 5.0   < > 5.3 5.2 5.4 5.1  NEUTROABS 3.6  --  3.9 3.8 4.1  --   HGB 8.0*   < > 7.2* 7.6* 8.1* 8.4*  HCT 24.3*   < > 23.7* 24.7* 25.8* 27.2*  MCV 92.4   < > 94.8 95.7 94.5 94.8  PLT 192   < > 192 202 212 225   < > = values in this interval not displayed.    Medications:    . atorvastatin  40 mg Oral Daily  . calcitRIOL  0.5 mcg Oral Daily  . calcium-vitamin D  1 tablet Oral BID  . coumadin book   Does not apply Once  . DULoxetine  30 mg Oral Daily  . enoxaparin (LOVENOX) injection  1 mg/kg Subcutaneous Q24H  . ferrous sulfate  325 mg Oral BID WC  . furosemide  60 mg Oral BID  . hydrALAZINE  10 mg Oral Q8H  . insulin aspart  0-20 Units Subcutaneous TID WC  . isosorbide mononitrate  15 mg Oral Daily  . loratadine  10 mg Oral Daily  . metoprolol succinate  25 mg Oral Daily  . mycophenolate  540 mg Oral BID  . pantoprazole  40 mg Oral Daily  . predniSONE  2.5 mg Oral Q breakfast  . sodium chloride flush  3 mL Intravenous Q12H  . tacrolimus  3 mg Oral BID  . vitamin B-12  1,000 mcg Oral Daily  . warfarin  5 mg Oral ONCE-1600  . warfarin   Does not apply Once  . Warfarin - Pharmacist Dosing Inpatient   Does not apply q1600     Claudia Desanctis MD 03/24/2020  3:20 PM

## 2020-03-24 NOTE — Progress Notes (Signed)
Educated Pt on self admin of lovenox shot in case she goes home on it.  Pt admin'd injection correctly without difficulty.  Will con't plan of care

## 2020-03-24 NOTE — Progress Notes (Signed)
   Covid-19 Vaccination Clinic  Name:  [name]    MRN: [id number] DOB: 03/10/1966  03/24/2020  [name] was observed post Covid-19 immunization for 15 minutes without incident. She was provided with Vaccine Information Sheet and instruction to access the V-Safe system.   [name] was instructed to call 911 with any severe reactions post vaccine: [name] Difficulty breathing  . Swelling of face and throat  . A fast heartbeat  . A bad rash all over body  . Dizziness and weakness   Immunizations Administered    Name Date Dose VIS Date Route   Pfizer COVID-19 Vaccine 03/24/2020  1:02 PM 0.3 mL 09/01/2018 Intramuscular   Manufacturer: [place]   Lot: [id number]   [place]: [address]-4

## 2020-03-24 NOTE — TOC Benefit Eligibility Note (Signed)
Transition of Care [facility]) Benefit Eligibility Note    Patient Details  Name: [name] MRN: [id number] Date of Birth: 10-27-1965   Medication/Dose: Lovenox(Enoxaparin) 120  Covered?: Yes  Tier: Other [name] 4)  Prescription Coverage [facility]erred Pharmacy: [facility]  Spoke with Person/Company/Phone Number:: Ulice Dash A. [facility] PH# [phone]  Co-Pay: 1.30 for 7 days  Prior Approval: No  Deductible:  (No Deductible)       [name] Phone Number: 03/24/2020, 1:44 PM

## 2020-03-24 NOTE — Progress Notes (Signed)
ANTICOAGULATION CONSULT NOTE - Initial Consult  Pharmacy Consult for warfarin Indication: atrial fibrillation and DVT  Allergies  Allergen Reactions  . Infed [Iron Dextran] Other (See Comments)    Pt has chest pain  Chest pain  . Cefazolin Itching and Nausea And Vomiting  . Ancef [Cefazolin Sodium] Other (See Comments)    Tolerated rocephin 11/03/10 Reports ancef causes itching, sweating, vomiting (x2-2009?)  . Lisinopril Swelling  . Other     Pt may be allergic to another antibiotic daughter isn't sure and pt unable to verify.    Patient Measurements: Height: 5\' 11"  (180.3 cm) Weight: 104.7 kg (230 lb 13.2 oz) IBW/kg (Calculated) : 70.8  Vital Signs: Temp: 98 F (36.7 C) (09/17 1113) Temp Source: Oral (09/17 1113) BP: 134/81 (09/17 1113) Pulse Rate: 76 (09/17 1113)  Labs: Recent Labs    03/22/20 0343 03/22/20 0343 03/23/20 0455 03/24/20 0833  HGB 7.6*   < > 8.1* 8.4*  HCT 24.7*  --  25.8* 27.2*  PLT 202  --  212 225  CREATININE 2.57*  --  2.59* 2.54*   < > = values in this interval not displayed.    Estimated Creatinine Clearance: 33 mL/min (A) (by C-G formula based on SCr of 2.54 mg/dL (H)).   Medical History: Past Medical History:  Diagnosis Date  . Blood transfusion without reported diagnosis   . 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 GERD (gastroesophageal reflux disease)    medication induced  . Hypertension   . Renal failure    hx of  . 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 Hx kidney transplant and CKD stage 3.  Pt had therapeutic LMWH level  on Lovenox 120mg  SQ Q12H, but then had bleeding complications; renal function at borderline for Q24 vs Q12 dosing. Weight has decreased significantly while here, dose okay at 108 mg for now.   Goal of Therapy:  Anti-Xa level 0.6-1 units/ml 4hrs after LMWH dose given INR 2-3 Monitor platelets by anticoagulation protocol: Yes   Plan:   Continue Lovenox 1 mg/kg (108 mg) SQ every 24 hours  Check LMWH level 9/18 @ 1200  Give warfarin 5 mg po x 1  Check baseline INR  Monitor daily INR, CBC, clinical course, s/sx of bleed, PO intake, Drug Drug Interactions    Thank you for allowing [place] to participate in this patients care.  Jens Som, PharmD Please see amion for complete clinical pharmacist phone list. 03/24/2020 11:24 AM

## 2020-03-24 NOTE — Telephone Encounter (Signed)
Attempted to contact patient for an appointment in reference to message below, but no answer.  Left detailed message asking patient to call the clinic back to schedule an appointment. Routing message to Red Team to make them aware.

## 2020-03-25 NOTE — Discharge Summary (Signed)
Name: [name] MRN: [id number] DOB: 08-Dec-1965 54 y.o. PCP: [name], DO  Date of Admission: 03/14/2020  8:57 AM Date of Discharge: 03/24/2020 Attending Physician: Dr. Jimmye Norman  Discharge Diagnosis: Principal Problem:   Heart failure with reduced ejection fraction ([place]) Active Problems:   HTN (hypertension)   Type II diabetes mellitus, well controlled ([place])   S/p cadaver renal transplant   Secondary hyperparathyroidism of renal origin ([place])   GERD (gastroesophageal reflux disease)   Vitamin B 12 deficiency   Anemia of chronic disease   Persistent atrial fibrillation ([facility])   Chronic kidney disease, stage 3b    Discharge Medications: Allergies as of 03/24/2020      Reactions   Infed [iron Dextran] Other (See Comments)   Pt has chest pain  Chest pain   Cefazolin Itching, Nausea And Vomiting   Ancef [cefazolin Sodium] Other (See Comments)   Tolerated rocephin 11/03/10 Reports ancef causes itching, sweating, vomiting (x2-2009?)   Lisinopril Swelling   Other    Pt may be allergic to another antibiotic daughter isn't sure and pt unable to verify.      Medication List    STOP taking these medications   amLODipine 10 MG tablet Commonly known as: NORVASC   CITRACAL +D3 PO   labetalol 200 MG tablet Commonly known as: NORMODYNE   magnesium oxide 400 MG tablet Commonly known as: MAG-OX   omeprazole 40 MG capsule Commonly known as: PRILOSEC   traMADol 50 MG tablet Commonly known as: ULTRAM     TAKE these medications   acetaminophen 325 MG tablet Commonly known as: TYLENOL Take 2 tablets (650 mg total) by mouth every 6 (six) hours as needed for mild pain (or Fever >/= 101).   albuterol (2.5 MG/3ML) 0.083% nebulizer solution Commonly known as: PROVENTIL Take 3 mLs (2.5 mg total) by nebulization every 6 (six) hours as needed for wheezing or shortness of breath.   atorvastatin 40 MG tablet Commonly known as: LIPITOR Take 1 tablet (40 mg total) by  mouth daily. What changed:   medication strength  how much to take  when to take this  additional instructions   calcitRIOL 0.5 MCG capsule Commonly known as: ROCALTROL Take 1 capsule (0.5 mcg total) by mouth daily. What changed: how much to take   calcium-vitamin D 500-200 MG-UNIT tablet Commonly known as: OSCAL WITH D Take 1 tablet by mouth 2 (two) times daily.   DULoxetine 30 MG capsule Commonly known as: CYMBALTA Take 1 capsule (30 mg total) by mouth daily. What changed: See the new instructions.   ferrous sulfate 325 (65 FE) MG tablet Take 1 tablet (325 mg total) by mouth 2 (two) times daily with a meal.   furosemide 80 MG tablet Commonly known as: Lasix Take 1 tablet (80 mg total) by mouth daily. What changed:   medication strength  how much to take   guaiFENesin-dextromethorphan 100-10 MG/5ML syrup Commonly known as: ROBITUSSIN DM Take 5 mLs by mouth every 4 (four) hours as needed for cough.   hydrALAZINE 10 MG tablet Commonly known as: APRESOLINE Take 1 tablet (10 mg total) by mouth every 8 (eight) hours.   insulin aspart 100 UNIT/ML injection Commonly known as: novoLOG Inject 0-20 Units into the skin 3 (three) times daily with meals. What changed:   how much to take  when to take this  reasons to take this   isosorbide mononitrate 30 MG 24 hr tablet Commonly known as: IMDUR Take 0.5 tablets (15 mg  total) by mouth daily.   loratadine 10 MG tablet Commonly known as: CLARITIN Take 1 tablet (10 mg total) by mouth daily.   metoprolol succinate 25 MG 24 hr tablet Commonly known as: TOPROL-XL Take 1 tablet (25 mg total) by mouth daily.   mycophenolate 180 MG EC tablet Commonly known as: MYFORTIC Take 3 tablets (540 mg total) by mouth 2 (two) times daily.   pantoprazole 40 MG tablet Commonly known as: PROTONIX Take 1 tablet (40 mg total) by mouth daily.   predniSONE 2.5 MG tablet Commonly known as: DELTASONE Take 1 tablet (2.5 mg total)  by mouth daily with breakfast. What changed: when to take this   tacrolimus 1 MG capsule Commonly known as: Prograf Take 3 capsules (3 mg total) by mouth 2 (two) times daily. What changed: when to take this   vitamin B-12 1000 MCG tablet Commonly known as: CYANOCOBALAMIN Take 1 tablet (1,000 mcg total) by mouth daily.     ASK your doctor about these medications   coumadin book Misc 1 each by Does not apply route once for 1 dose. Ask about: Should I take this medication?   warfarin Misc Commonly known as: COUMADIN 1 each by Does not apply route once for 1 dose. Ask about: Should I take this medication?       Disposition and follow-up:   [name] was discharged from [facility] in Stable condition.  At the hospital follow up visit please address:  1.  Follow-up:  A. CHF - make sure patient is on goal directed therapy and has follow up at the Heart Failure clinic.    B. CKD - make sure to avoid nephrotoxic medications and has nephrology follow up   C. Afib - patient started on Warfarin due to AT3 def. Patient may be able to transition to NOAC in the clinic. She will need to be educated about NOACs and assess her interest. Please reach out to Dr. Elie Confer for more information.   2.  Labs / imaging needed at time of follow-up: CBC, CMP, PT/INR  3.  Pending labs/ test needing follow-up: none  Follow-up Appointments:  Follow-up Information    [facility] [place] Follow up.   Contact information: [place] [address] [place], [place] [facility] Follow up.   Why: rolling walker and 3n1 arranged- to be delivered to room prior to discharge Contact information: [address] [place]  [address] [phone]               Hospital Course by problem list: [name] is a 54 year old woman with a PMH significant for DMII, GERD, hypertension, renal transplant, CKD, and tobacco  use who presents due with 2 days of shortness of breath and orthopnea, 1 day of semi-productive cough and a few months of waxing and waning swelling in all extremities that acutely worsened over the last few days.    Acute respiratory failure Unclear etiology at admission. Differential diagnosis included infection, CHF, DVT/PE, multiple myeloma, and AKI on CKD. Covid negative, not in lactic acidosis, BNP >4500, urine protein >300 mg/dL, WBC 8.8 up from baseline of 3-4 then decreased to 4.9 the next day, ANC 7.8 up from 3.2 four weeks prior, all in the setting of chronic immunosuppression. Briefly febrile to 100.2 on arrival but afebrile thereafter. CXR showing likely CHF vs infection and EKG showing LBB that was present on previous EKG. She  was treated with ceftriaxone and azithromycin for possible CAP until CXR on 9/10 showed no focal consolidation and pneumonia appeared unlikely. She was initially diuresed with IV furosemide 40 mg BID but this was soon increased to 80 mg BID. Daily weights and strict I/Os were measured to monitor fluid status. We titrated supplemental O2 as needed. Echocardiogram showed EF 25-30% with diffuse hypokinesis and V/Q scan revealed no pulmonary embolus. Blood cultures drawn at admission never grew and regular CBCs and CMPs were not concerning for infection.   DVT Unclear etiology for the DVT present in her right axillary vein that was found by doppler imaging at admission. Possibly associated with her nephrotic syndrome leading to loss of ATIII. She was treated with increasing doses of Heparin for DVT/PE but was never found to have therapeutic levels of heparin and subsequently was found to have ATIII levels only 57% of normal, although this could have been the result of heparin and not necessarily the cause of her unresponsiveness thereto. Anticoagulation was thereafter pursued with lovenox, but this was complicated by a 1 gram drop in her Hgb over 4 days, PTT >200, and acute  pain in her right upper thigh found on CT to be likely related to swelling of her distal right iliopsoas muscle with subsequent congestion of the femoral canal. This was thought to be hemorrhage, so anticoagulation was stopped until 9/15, when she was started again on lovenox with the plan to bridge her to warfarin.  Atrial fibrillation with RVR This was noted on 9/8-9/9. CHA2DS2-VASc Score = 4 (1 point each for HTN, DM, vascular disease, and gender). Amiodarone IV infusion was ordered, she was loaded with a 150 mg/hr dose and then an initial 55m/hr for 6 hours and then 334mhr after for 18 hours. She was already on heparin and later on lovenox for anticoagulation, although this was stopped for concern for bleeding in her iliopsoas. Her amiodarone was switched to oral 200 mg on 9/13 and changed for metoprolol succinate 25 mg oral daily on 9/16. Anticoagulation was pursued with lovenox once the iliopsoas swelling was found to be due to bursitis. Patient was transitioned to Warfarin. She will need to be evaluated for NOAC at clinic follow up.  HFrEF New diagnosis based on echo, 25-30% EF. HF team feels most likely hypertensive cardiomyopathy, possible ischemic cardiomyopathy given CT 4/21 showing multivessel CAD w/ heavily calcified LAD. Amyloid less likely given normal SPEP. Also possibly due to tachycardia from rapid Afib. TSH normal, so not thyroid-related. Diuresis was pursued with Lasix 80 mg BID.  Unna boots were placed to assist with diuresis of edema until 9/15. Noninvasive ischemic workup was performed with NST, revealing previous silent MI as the likely cause of her HFrEF and EF of 30-44%. She was started on low dose Bidil which was then split into separate doses to optimize its effects.  CAD: Prior chest CT 4/21 reviewed and showed multivessel CAD w/ heavily calcified LAD. Not a candidate for LHC currently w/ abnormal renal function. NST revealed previous MI. Risk stratification was undertaken to  minimize risk of future events. HTN is controlled here, but uncontrolled previous to hospitalization. - Lipid panel WNL, but increased atorvastatin to 40 mg daily - DM well controlled on insulin outpatient (A1c 5.1), will start SGLT2 inhibitor by discharge - CKD followed by nephrology here and outpatient - Smoking cessation counseling provided, she is committed to stopping - Diet and exercise: Nutrition counseling in our outpatient clinic after discharge - Metoprolol 25 mg daily  Acute kidney injury in patient with chronic kidney disease stage III s/p transplant Patient presented with worsening of her kidney function over the last several months. She had significant anasarca and proteinuria concerning for nephrotic syndrome vs other paraproteinemia, supported by UPCR in nephrotic range. Free light chain, IFE, and SPEP not concerning for multiple myeloma. Nephrology was consulted and provided recommendations, which were followed. Mycophenolate 540 mg BID, Tacrolimus 3 mg BID, and prednisone 2.5 mg daily were continued for antirejection. Avoided NSAIDs, morphine, baclofen, oral sodium phosphate and mag citrate-based laxatives, and iodinated IV contrast. Monitored strict I/Os. She was diuresed with lasix throughout her stay, with resultant good urine output.  Thigh pain--possible R iliopsoas hematoma/hemorrhage LE doppler revealed no DVT. Non-contrast CT of pelvis and LE revealed asymmetric swelling of distal R iliopsoas muscle, consistent with either bursitis or hemorrhage. It appeared that this was the mostly likely cause of her R thigh pain. Hemorrhage seemed particularly likely given the timeline: her Hgb was 8.8 prior to this pain and had dropped to 7.7 since, with an acute drop to 8.0 around the time she began complaining of the pain, all in the setting of appropriate anticoagulation for her R axillary vein DVT. Anticoagulation was discontinued on 9/11 given this concern. She was prescribed Dilaudid  1 mg PO PRN q4hrs for her significant pain. CBCs were trended to monitor for bleeding and the need for transfusion. MRI on 9/15 revealed that this swelling was due to bursitis, which allowed [place] to resume anticoagulation. We encouraged mobility and ambulation and PT   DMII SSI Novolog resistant while inpatient.   GERD Her home omeprazole was switched to pantoprazole 40 mg daily.   HTN Held home amlodipine 10 mg daily. Started metoprolol 25 mg daily and she was sent home on this.   Elevated alkaline phosphatase, bony lesions Given her lack of hyperbilirubinemia, her history of various bony lesions on previous scans, anemia, renal insufficiency, and dramatically elevated alk phos now and 4 weeks ago (1308 now and 1282 four weeks ago), we evaluated [name] for multiple myeloma with SPEP and light chains, which were not concerning for malignancy. It appears that her elevated alkaline phosphatase is related to her marked secondary hyperparathyroidism.  Secondary hyperparathyroidism PTH up to 760 from 324 1 year ago. Low Ca, normal phos, and longstanding elevated PTH, consistent with secondary hyperparathyroidism. We restarted home calcitriol 0.5 mcg daily and calcium-vitamin D to 500-200 mg BID, with eventual improvement of her low calcium levels.  Anemia of chronic disease Iron studies and overall picture consistent with anemia of chronic disease. We treated this with 325 mg ferrous sulfate BID as we sought to identify and treat the underlying disorders causing this anemia. She was given 1 unit of pRBCs and her hemoglobin was 8.4 by discharge.    Discharge Vitals:   BP 122/64 (BP Location: Left Arm)   Pulse 68   Temp 99 F (37.2 C) (Oral)   Resp 19   Ht _0  (1.803 m)   Wt 104.7 kg   LMP 11/05/2012   SpO2 99%   BMI 32.19 kg/m   Pertinent Labs, Studies, and Procedures:  CBC Latest Ref Rng & Units 03/24/2020 03/23/2020 03/22/2020  WBC 4.0 - 10.5 K/uL 5.1 5.4 5.2  Hemoglobin 12.0 -  15.0 g/dL 8.4(L) 8.1(L) 7.6(L)  Hematocrit 36 - 46 % 27.2(L) 25.8(L) 24.7(L)  Platelets 150 - 400 K/uL 225 212 202    CMP Latest Ref Rng & Units 03/24/2020 03/23/2020 03/22/2020  Glucose  70 - 99 mg/dL 127(H) 98 94  BUN 6 - 20 mg/dL 37(H) 38(H) 37(H)  Creatinine 0.44 - 1.00 mg/dL 2.54(H) 2.59(H) 2.57(H)  Sodium 135 - 145 mmol/L 138 141 142  Potassium 3.5 - 5.1 mmol/L 3.7 3.9 3.8  Chloride 98 - 111 mmol/L 106 109 108  CO2 22 - 32 mmol/L _0 Calcium 8.9 - 10.3 mg/dL 8.0(L) 8.4(L) 8.2(L)  Total Protein 6.5 - 8.1 g/dL - - -  Total Bilirubin 0.3 - 1.2 mg/dL - - -  Alkaline Phos 38 - 126 U/L - - -  AST 15 - 41 U/L - - -  ALT 0 - 44 U/L - - -    DG Chest Portable 1 View  Result Date: 03/14/2020 CLINICAL DATA:  Shortness of breath EXAM: PORTABLE CHEST 1 VIEW COMPARISON:  02/15/2020 FINDINGS: Mild cardiomegaly. Pulmonary vascular congestion with increased bibasilar interstitial opacities. No large pleural fluid collection. No pneumothorax. IMPRESSION: Findings suggestive of CHF with interstitial edema. Superimposed infection would be difficult to exclude. Electronically Signed   By: Davina Poke D.O.   On: 03/14/2020 10:10   UE Venous Duplex ([facility] and [facility] ONLY)  Result Date: 03/14/2020 UPPER VENOUS STUDY  Indications: Edema, and SOB Comparison Study: No prior studies. Performing Technologist: Darlin Coco  Examination Guidelines: A complete evaluation includes B-mode imaging, spectral Doppler, color Doppler, and power Doppler as needed of all accessible portions of each vessel. Bilateral testing is considered an integral part of a complete examination. Limited examinations for reoccurring indications may be performed as noted.  Right Findings: +----------+------------+---------+-----------+----------+---------------------+ RIGHT     CompressiblePhasicitySpontaneousProperties       Summary        +----------+------------+---------+-----------+----------+---------------------+ IJV            Full       Yes       Yes              Appearance of fibrous                                                           synechiae       +----------+------------+---------+-----------+----------+---------------------+ Subclavian    Full       Yes       Yes                                    +----------+------------+---------+-----------+----------+---------------------+ Axillary      None       No        No                                     +----------+------------+---------+-----------+----------+---------------------+ Brachial      Full       Yes       Yes                                    +----------+------------+---------+-----------+----------+---------------------+ Radial        Full                                                        +----------+------------+---------+-----------+----------+---------------------+  Ulnar         Full                                                        +----------+------------+---------+-----------+----------+---------------------+ Cephalic      Full                                                        +----------+------------+---------+-----------+----------+---------------------+ Basilic       Full                                                        +----------+------------+---------+-----------+----------+---------------------+  Left Findings: +----------+------------+---------+-----------+----------+-------+ LEFT      CompressiblePhasicitySpontaneousPropertiesSummary +----------+------------+---------+-----------+----------+-------+ Subclavian    Full       Yes       Yes                      +----------+------------+---------+-----------+----------+-------+  Summary:  Right: No evidence of superficial vein thrombosis in the upper extremity. Findings consistent with acute deep vein thrombosis involving the right axillary vein.  Left: No evidence of thrombosis in the subclavian.  *See  table(s) above for measurements and observations.  Diagnosing physician: Deitra Mayo MD Electronically signed by Deitra Mayo MD on 03/14/2020 at 4:07:15 PM.    Final      Discharge Instructions: Discharge Instructions    Diet - low sodium heart healthy   Complete by: As directed    Discharge instructions   Complete by: As directed    Please see discharge paperwork for changes to your medications.   Increase activity slowly   Complete by: As directed       Signed: Marianna Payment, MD 03/25/2020, 3:03 PM   Pager: [phone]

## 2020-03-26 LAB — TACROLIMUS LEVEL: Tacrolimus (FK506) - LabCorp: 2.3 ng/mL (ref 2.0–20.0)

## 2020-03-27 ENCOUNTER — Telehealth: Payer: Self-pay | Admitting: Internal Medicine

## 2020-03-27 ENCOUNTER — Ambulatory Visit: Payer: Medicare Other | Admitting: *Deleted

## 2020-03-27 DIAGNOSIS — Z940 Kidney transplant status: Secondary | ICD-10-CM

## 2020-03-27 DIAGNOSIS — E119 Type 2 diabetes mellitus without complications: Secondary | ICD-10-CM

## 2020-03-27 DIAGNOSIS — I10 Essential (primary) hypertension: Secondary | ICD-10-CM

## 2020-03-27 DIAGNOSIS — E785 Hyperlipidemia, unspecified: Secondary | ICD-10-CM

## 2020-03-27 NOTE — Telephone Encounter (Signed)
Returned call to [place] at [facility]. Requesting dx codes for Prograf and Myfortic written 03/24/2020. Will route to Prescriber to resend Rxs with dx code. Hubbard Hartshorn, BSN, RN-BC

## 2020-03-27 NOTE — Chronic Care Management (AMB) (Signed)
  Chronic Care Management   Note  03/27/2020 Name: [name] MRN: [id number] DOB: [birth year]   Successful outreach to patient to explain Chronic Care Management Program and role of CCM RN and CCM BSW. Patient states she is suppose to make a post hospital discharge follow up appointment with the clinic and will call after clinic reopens after lunch. Patient was hospitalized [facility]  from 9/7-9/17 for dyspnea related to  heart failure, acute on chronic kidney disease (s/p cadaver transplant in October 2011), atrial fibrillation and DVT of right axillary vein   Follow up plan: The care management team will reach out to the patient again over the next 1-2 days to arrange for the initial intake assessment either via phone and during the patient's clinic visit.  Kelli Churn RN, CCM, [facility] RN Care Manager [phone]

## 2020-03-27 NOTE — Telephone Encounter (Signed)
Pls call pharmacy [phone]

## 2020-03-29 ENCOUNTER — Ambulatory Visit: Payer: Medicare Other | Admitting: *Deleted

## 2020-03-29 DIAGNOSIS — I10 Essential (primary) hypertension: Secondary | ICD-10-CM

## 2020-03-29 DIAGNOSIS — Z940 Kidney transplant status: Secondary | ICD-10-CM

## 2020-03-29 DIAGNOSIS — E119 Type 2 diabetes mellitus without complications: Secondary | ICD-10-CM

## 2020-03-29 DIAGNOSIS — E785 Hyperlipidemia, unspecified: Secondary | ICD-10-CM

## 2020-03-29 MED ORDER — MYCOPHENOLATE SODIUM 180 MG PO TBEC: 540.0000 mg | DELAYED_RELEASE_TABLET | Freq: Two times a day (BID) | ORAL | 1 refills | Status: DC

## 2020-03-29 MED ORDER — TACROLIMUS 1 MG PO CAPS: 3.0000 mg | ORAL_CAPSULE | Freq: Two times a day (BID) | ORAL | 3 refills | Status: DC

## 2020-03-29 NOTE — Telephone Encounter (Signed)
Refill placed with diagnosis.

## 2020-03-29 NOTE — Chronic Care Management (AMB) (Signed)
  Chronic Care Management   Note  03/29/2020 Name: [name] MRN: [id number] DOB: Nov 21, 1965  Successful outreach to patient to arrange chronic care management intake assessment. Patient states she has a clinic appointment on 9/27 at 9:15 am so will plan to meet with patient during her clinic appointment.  Follow up plan: Face to Face appointment with care management team member scheduled for: 04/03/20.  Kelli Churn RN, CCM, [facility] RN Care Manager [phone]

## 2020-03-30 ENCOUNTER — Other Ambulatory Visit: Payer: Self-pay | Admitting: Internal Medicine

## 2020-03-30 DIAGNOSIS — Z940 Kidney transplant status: Secondary | ICD-10-CM

## 2020-03-31 ENCOUNTER — Other Ambulatory Visit: Payer: Self-pay | Admitting: Internal Medicine

## 2020-03-31 ENCOUNTER — Telehealth: Payer: Self-pay

## 2020-03-31 DIAGNOSIS — M25562 Pain in left knee: Secondary | ICD-10-CM | POA: Diagnosis not present

## 2020-03-31 DIAGNOSIS — I4891 Unspecified atrial fibrillation: Secondary | ICD-10-CM | POA: Diagnosis not present

## 2020-03-31 DIAGNOSIS — M5136 Other intervertebral disc degeneration, lumbar region: Secondary | ICD-10-CM | POA: Diagnosis not present

## 2020-03-31 DIAGNOSIS — Z940 Kidney transplant status: Secondary | ICD-10-CM | POA: Diagnosis not present

## 2020-03-31 DIAGNOSIS — I480 Paroxysmal atrial fibrillation: Secondary | ICD-10-CM

## 2020-03-31 DIAGNOSIS — I82A11 Acute embolism and thrombosis of right axillary vein: Secondary | ICD-10-CM | POA: Diagnosis not present

## 2020-03-31 DIAGNOSIS — E1122 Type 2 diabetes mellitus with diabetic chronic kidney disease: Secondary | ICD-10-CM | POA: Diagnosis not present

## 2020-03-31 DIAGNOSIS — N189 Chronic kidney disease, unspecified: Secondary | ICD-10-CM | POA: Diagnosis not present

## 2020-03-31 DIAGNOSIS — I509 Heart failure, unspecified: Secondary | ICD-10-CM | POA: Diagnosis not present

## 2020-03-31 DIAGNOSIS — M25561 Pain in right knee: Secondary | ICD-10-CM | POA: Diagnosis not present

## 2020-03-31 DIAGNOSIS — I130 Hypertensive heart and chronic kidney disease with heart failure and stage 1 through stage 4 chronic kidney disease, or unspecified chronic kidney disease: Secondary | ICD-10-CM | POA: Diagnosis not present

## 2020-03-31 DIAGNOSIS — K219 Gastro-esophageal reflux disease without esophagitis: Secondary | ICD-10-CM | POA: Diagnosis not present

## 2020-03-31 DIAGNOSIS — I82A19 Acute embolism and thrombosis of unspecified axillary vein: Secondary | ICD-10-CM

## 2020-03-31 DIAGNOSIS — F17200 Nicotine dependence, unspecified, uncomplicated: Secondary | ICD-10-CM | POA: Diagnosis not present

## 2020-03-31 MED ORDER — WARFARIN SODIUM 5 MG PO TABS: 5.0000 mg | ORAL_TABLET | Freq: Every day | ORAL | 0 refills | Status: DC

## 2020-03-31 NOTE — Telephone Encounter (Signed)
[name] with [facility] called and notified that MD will be sending warfarin 5mg  daily for patient to take until Monday, at which time, she needs to f/u in the Coumadin clinic for INR labwork.  [place] nurse requesting VO for PT 2X/week for 3 weeks and 1X/week for 5 weeks.  VO given.  Will send to PCP for agreement or denial.  TC to patient and she was also notified of above.  She verbalized understanding and states she has an appt w/ Dr. Sharon Seller on Monday.  Appt confirmed and pt informed Dr. Sharon Seller will have her bloodwork drawn at her appt.   SChaplin, RN,BSN

## 2020-03-31 NOTE — Telephone Encounter (Signed)
Thank you, agree with plan for PT.   She will have an appointment with the Coumadin clinic as well, right?

## 2020-03-31 NOTE — Telephone Encounter (Signed)
[name] with [facility] requesting to speak with a nurse about meds. Please call pt back.

## 2020-03-31 NOTE — Telephone Encounter (Signed)
I am going to send in warfarin 5 mg daily until Monday. She needs to follow-up in the warfarin clinic on Monday so we can check her INR.

## 2020-03-31 NOTE — Telephone Encounter (Signed)
RTC to Mickel Baas w/ [facility].  She is asking if patient should be on coumadin?  Per chart review, this is not listed on her current med list.  However, pt was discharged from hospital 03/24/20 and on patient's discharge instructions, it does list start taking "coumadin book" no instructions. On resident discharge list, it states to ask Dr. About starting Coumadin. [place] nurse states pt did not receive a RX for Coumadin. Will send to PCP to advise. Thank you, SChaplin, RN,BSN

## 2020-03-31 NOTE — Telephone Encounter (Signed)
She has an appt with Ulice Dash in the Coumadin clinic at 1015, after she sees you. Thanks! Redonna Wilbert

## 2020-04-03 ENCOUNTER — Ambulatory Visit (INDEPENDENT_AMBULATORY_CARE_PROVIDER_SITE_OTHER): Payer: Medicare Other | Admitting: Internal Medicine

## 2020-04-03 ENCOUNTER — Encounter: Payer: Self-pay | Admitting: Internal Medicine

## 2020-04-03 ENCOUNTER — Telehealth: Payer: Self-pay | Admitting: *Deleted

## 2020-04-03 ENCOUNTER — Ambulatory Visit: Payer: Medicare Other | Admitting: *Deleted

## 2020-04-03 ENCOUNTER — Other Ambulatory Visit: Payer: Self-pay

## 2020-04-03 ENCOUNTER — Ambulatory Visit: Payer: Medicare Other

## 2020-04-03 VITALS — BP 156/86 | HR 70 | Temp 99.0°F | Wt 231.1 lb

## 2020-04-03 DIAGNOSIS — Z940 Kidney transplant status: Secondary | ICD-10-CM

## 2020-04-03 DIAGNOSIS — D638 Anemia in other chronic diseases classified elsewhere: Secondary | ICD-10-CM | POA: Diagnosis not present

## 2020-04-03 DIAGNOSIS — I82A11 Acute embolism and thrombosis of right axillary vein: Secondary | ICD-10-CM

## 2020-04-03 DIAGNOSIS — I10 Essential (primary) hypertension: Secondary | ICD-10-CM

## 2020-04-03 DIAGNOSIS — N2581 Secondary hyperparathyroidism of renal origin: Secondary | ICD-10-CM | POA: Diagnosis not present

## 2020-04-03 DIAGNOSIS — M7071 Other bursitis of hip, right hip: Secondary | ICD-10-CM

## 2020-04-03 DIAGNOSIS — I5022 Chronic systolic (congestive) heart failure: Secondary | ICD-10-CM

## 2020-04-03 DIAGNOSIS — I110 Hypertensive heart disease with heart failure: Secondary | ICD-10-CM

## 2020-04-03 DIAGNOSIS — Z23 Encounter for immunization: Secondary | ICD-10-CM | POA: Diagnosis not present

## 2020-04-03 DIAGNOSIS — I5020 Unspecified systolic (congestive) heart failure: Secondary | ICD-10-CM

## 2020-04-03 DIAGNOSIS — D6859 Other primary thrombophilia: Secondary | ICD-10-CM

## 2020-04-03 DIAGNOSIS — I480 Paroxysmal atrial fibrillation: Secondary | ICD-10-CM | POA: Diagnosis not present

## 2020-04-03 DIAGNOSIS — R748 Abnormal levels of other serum enzymes: Secondary | ICD-10-CM | POA: Diagnosis not present

## 2020-04-03 MED ORDER — APIXABAN 5 MG PO TABS: ORAL_TABLET | ORAL | 0 refills | Status: DC

## 2020-04-03 MED ORDER — APIXABAN 5 MG PO TABS: 5.0000 mg | ORAL_TABLET | Freq: Two times a day (BID) | ORAL | 4 refills | Status: AC

## 2020-04-03 NOTE — Patient Instructions (Addendum)
Thank you for allowing [place] to provide your care today. Today we discussed your heart failure and lower extremity swelling and blood clot of your arm.    I have ordered the following labs for you:  Complete metabolic panel and complete blood count.    I will call if any are abnormal.    Today we made the following changes to your medications:   Please START taking  Increase your lasix to two tablets in the morning and afternoon (80 mg total) for four days.  Please let your nephrology doctor know that you have increased the lasix dose.   Eliquis 10 mg - take one tablet two times per day for 7 days Then Eliquis 5 mg - take one tablet two times per day for 7 days   Please follow-up on October 7th.    I have placed a referral to the heart failure clinic so that you can follow-up with them.   Please call the internal medicine center clinic if you have any questions or concerns, we may be able to help and keep you from a long and expensive emergency room wait. Our clinic and after hours phone number is [phone], the best time to call is Monday through Friday 9 am to 4 pm but there is always someone available 24/7 if you have an emergency. If you need medication refills please notify your pharmacy one week in advance and they will send [place] a request.

## 2020-04-03 NOTE — Progress Notes (Signed)
   CC: renal failure  HPI:  [name] is a 54 y.o. with PMH as below presenting for hospital follow-up.   Please see A&P for assessment of the patient's acute and chronic medical conditions.   Past Medical History:  Diagnosis Date  . Blood transfusion without reported diagnosis   . 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 GERD (gastroesophageal reflux disease)    medication induced  . Hypertension   . Renal failure    hx of  . 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  Respiratory: Negative for cough and shortness of breath.   Cardiovascular: Positive for leg swelling. Negative for chest pain and orthopnea.  Gastrointestinal: Negative for abdominal pain, nausea and vomiting.  Genitourinary: Negative for dysuria, frequency and urgency.  Neurological: Positive for weakness. Negative for dizziness and focal weakness.   Physical Exam: Constitution: NAD, appears stated age, sitting up in chair HENT: [place]/AT, +JVP 4cm Cardio: RRR, no m/r/g, +2 LE edema Respiratory: CTA, no w/r/r Abdominal: NTTP, soft, non-distended Neuro: normal affect, a&ox3 Skin: c/d/i    Vitals:   04/03/20 1013  BP: (!) 156/86  Pulse: 70  Temp: 99 F (37.2 C)  TempSrc: Oral  SpO2: 100%  Weight: 231 lb 1.6 oz (104.8 kg)    Assessment & Plan:   See Encounters Tab for problem based charting.  Patient discussed with Dr. Jimmye Norman

## 2020-04-03 NOTE — Progress Notes (Addendum)
Internal Medicine Clinic Resident  I have personally reviewed this encounter including the documentation in this note and/or discussed this patient with the care management provider. I will address any urgent items identified by the care management provider and will communicate my actions to the patient's PCP. I have reviewed the patient's CCM visit with my supervising attending, Dr Jimmye Norman.  Jean Rosenthal, MD 04/03/2020  Internal Medicine Attending: I reviewed case and documentation and agree.

## 2020-04-03 NOTE — Assessment & Plan Note (Addendum)
She has not yet heard from the heart failure clinic. She has been taking her lasix 80 mg qd. She has had some swelling in her legs which partially improves with elevation but not completely. She has some shortness of breath with moving around but otherwise feels ok.  2+pitting edema on exam, mild JVP, lung sounds clear, weight stable compared to discharge at 230 lbs.   - increase lasix to 80 mg bid for four days, continue toprol 25 mg qd  - bmp today  - f/u with nephrology on 9/29 - referral placed to HF clinic  - f/u next week.

## 2020-04-03 NOTE — Assessment & Plan Note (Signed)
BP Readings from Last 3 Encounters:  04/03/20 (!) 156/86  03/24/20 122/64  02/16/20 139/86   Blood pressure elevated today. She has been taking toprol 25 mg, hydralazine 10 mg q8h and lasix 80 mg qd. We are increasing her lasix today for increased LE edema. If still elevated, can consider increasing toprol as she has had some sensation of palpitations with exertion. She is in NSR today.

## 2020-04-03 NOTE — Telephone Encounter (Signed)
Pharmacy called to verify date and diagnosis for renal transplant.

## 2020-04-03 NOTE — Assessment & Plan Note (Signed)
Was unable to pick up warfarin after discharge. We will start her on eliquis today. She occasionally has palpitations with exertion. HR 70, she is currently in NSR.   - f/u HF clinic  - will hold off increasing metoprolol today with increase in lasix - consider increase next week if bp stable  - start eliquis 10 mg bid for 7 days, then 5 mg bid.

## 2020-04-03 NOTE — Assessment & Plan Note (Signed)
She is taking her ferrous sulfate bid. Last hemoglobin 8.4 at discharge.   - CBC today.

## 2020-04-03 NOTE — Assessment & Plan Note (Addendum)
She was able to pick up her myfortic and prograf after discharge. No difficulty with urination.   - bmp, mg today  - f/u with nephrology 9/29

## 2020-04-03 NOTE — Chronic Care Management (AMB) (Addendum)
  Chronic Care Management   Note  04/03/2020 Name: [name] MRN: [id number] DOB: 25-Mar-1966   Met patient and her daughter in law briefly as they were leaving patient's clinic appointment. Patient was not able to stay and meet with this CCM RN to complete the initial intake.  Follow up plan: Briefly introduced the chronic care management program and the role of the CCM RN. Patient states she is going to stay temporarily with her daughter in law while her daughter is out to town and she will call this CCM RN to arrange initial telephone intake. Provided patient with Emmi education handouts on: Heart Failure and Atrial Fibrillation, Heart Failure with Reduced Ejection Fraction and Medicines for Heart Failure with Reduced Ejection Fraction.  Also provided patient with educational booklets entitled: Blood Pressure Control and Guide to Living with A Fib  Gave patient [place] Management spiral bound calendar with business cards of CCM RN and CCM BSW.  The patient has been provided with contact information for the care management team and has been advised to call with any health related questions or concerns.   Kelli Churn RN, CCM, [facility] RN Care Manager [phone]

## 2020-04-03 NOTE — Assessment & Plan Note (Signed)
She was unable to pick up warfarin after discharged. We will transition her to eliquis.   - start eliquis 10 mg bid for 7 days, then 5 mg bid

## 2020-04-04 ENCOUNTER — Telehealth (HOSPITAL_COMMUNITY): Payer: Self-pay | Admitting: Internal Medicine

## 2020-04-04 DIAGNOSIS — N189 Chronic kidney disease, unspecified: Secondary | ICD-10-CM | POA: Diagnosis not present

## 2020-04-04 DIAGNOSIS — I130 Hypertensive heart and chronic kidney disease with heart failure and stage 1 through stage 4 chronic kidney disease, or unspecified chronic kidney disease: Secondary | ICD-10-CM | POA: Diagnosis not present

## 2020-04-04 DIAGNOSIS — D6859 Other primary thrombophilia: Secondary | ICD-10-CM | POA: Insufficient documentation

## 2020-04-04 DIAGNOSIS — I82A11 Acute embolism and thrombosis of right axillary vein: Secondary | ICD-10-CM | POA: Diagnosis not present

## 2020-04-04 DIAGNOSIS — R748 Abnormal levels of other serum enzymes: Secondary | ICD-10-CM | POA: Insufficient documentation

## 2020-04-04 DIAGNOSIS — M25561 Pain in right knee: Secondary | ICD-10-CM | POA: Diagnosis not present

## 2020-04-04 DIAGNOSIS — I509 Heart failure, unspecified: Secondary | ICD-10-CM | POA: Diagnosis not present

## 2020-04-04 DIAGNOSIS — E1122 Type 2 diabetes mellitus with diabetic chronic kidney disease: Secondary | ICD-10-CM | POA: Diagnosis not present

## 2020-04-04 LAB — CBC
Hematocrit: 27 % — ABNORMAL LOW (ref 34.0–46.6)
Hemoglobin: 8.8 g/dL — ABNORMAL LOW (ref 11.1–15.9)
MCH: 29.9 pg (ref 26.6–33.0)
MCHC: 32.6 g/dL (ref 31.5–35.7)
MCV: 92 fL (ref 79–97)
Platelets: 183 10*3/uL (ref 150–450)
RBC: 2.94 x10E6/uL — ABNORMAL LOW (ref 3.77–5.28)
RDW: 15 % (ref 11.7–15.4)
WBC: 5.8 10*3/uL (ref 3.4–10.8)

## 2020-04-04 LAB — CMP14 + ANION GAP
ALT: 12 IU/L (ref 0–32)
AST: 15 IU/L (ref 0–40)
Albumin/Globulin Ratio: 1.7 (ref 1.2–2.2)
Albumin: 3.6 g/dL — ABNORMAL LOW (ref 3.8–4.9)
Alkaline Phosphatase: 1089 IU/L (ref 44–121)
Anion Gap: 17 mmol/L (ref 10.0–18.0)
BUN/Creatinine Ratio: 15 (ref 9–23)
BUN: 32 mg/dL — ABNORMAL HIGH (ref 6–24)
Bilirubin Total: 0.3 mg/dL (ref 0.0–1.2)
CO2: 18 mmol/L — ABNORMAL LOW (ref 20–29)
Calcium: 8.2 mg/dL — ABNORMAL LOW (ref 8.7–10.2)
Chloride: 110 mmol/L — ABNORMAL HIGH (ref 96–106)
Creatinine, Ser: 2.19 mg/dL — ABNORMAL HIGH (ref 0.57–1.00)
GFR calc Af Amer: 28 mL/min/{1.73_m2} — ABNORMAL LOW (ref >59)
GFR calc non Af Amer: 24 mL/min/{1.73_m2} — ABNORMAL LOW (ref >59)
Globulin, Total: 2.1 g/dL (ref 1.5–4.5)
Glucose: 93 mg/dL (ref 65–99)
Potassium: 3.7 mmol/L (ref 3.5–5.2)
Sodium: 145 mmol/L — ABNORMAL HIGH (ref 134–144)
Total Protein: 5.7 g/dL — ABNORMAL LOW (ref 6.0–8.5)

## 2020-04-04 LAB — MAGNESIUM: Magnesium: 1.7 mg/dL (ref 1.6–2.3)

## 2020-04-04 NOTE — Assessment & Plan Note (Signed)
ADDENDUM: Alk phos continues to be above 1000. PTH at discharge 9/17 was 760 with low Ca that improved to  On calcitriol .52mcg qd and calcium-vitamin D 500-200 mg bid. Corrected Ca today is 8.5.   - continue calcitriol and calcium-vitamin D - f/u with nephrology on 9/29

## 2020-04-04 NOTE — Telephone Encounter (Signed)
F/U with referral from outpatient clinic, Dr Sharon Seller, call back # [phone], Thanks

## 2020-04-04 NOTE — Assessment & Plan Note (Signed)
Alka phos >1000. Please see Overview above.

## 2020-04-05 DIAGNOSIS — I890 Lymphedema, not elsewhere classified: Secondary | ICD-10-CM | POA: Diagnosis not present

## 2020-04-05 DIAGNOSIS — N2581 Secondary hyperparathyroidism of renal origin: Secondary | ICD-10-CM | POA: Diagnosis not present

## 2020-04-05 DIAGNOSIS — Z79899 Other long term (current) drug therapy: Secondary | ICD-10-CM | POA: Diagnosis not present

## 2020-04-05 DIAGNOSIS — I129 Hypertensive chronic kidney disease with stage 1 through stage 4 chronic kidney disease, or unspecified chronic kidney disease: Secondary | ICD-10-CM | POA: Diagnosis not present

## 2020-04-05 DIAGNOSIS — I82A11 Acute embolism and thrombosis of right axillary vein: Secondary | ICD-10-CM | POA: Diagnosis not present

## 2020-04-05 DIAGNOSIS — N179 Acute kidney failure, unspecified: Secondary | ICD-10-CM | POA: Diagnosis not present

## 2020-04-05 DIAGNOSIS — I119 Hypertensive heart disease without heart failure: Secondary | ICD-10-CM | POA: Diagnosis not present

## 2020-04-05 DIAGNOSIS — N189 Chronic kidney disease, unspecified: Secondary | ICD-10-CM | POA: Diagnosis not present

## 2020-04-05 DIAGNOSIS — Z8679 Personal history of other diseases of the circulatory system: Secondary | ICD-10-CM | POA: Diagnosis not present

## 2020-04-05 DIAGNOSIS — D631 Anemia in chronic kidney disease: Secondary | ICD-10-CM | POA: Diagnosis not present

## 2020-04-05 DIAGNOSIS — Z940 Kidney transplant status: Secondary | ICD-10-CM | POA: Diagnosis not present

## 2020-04-05 DIAGNOSIS — I43 Cardiomyopathy in diseases classified elsewhere: Secondary | ICD-10-CM | POA: Diagnosis not present

## 2020-04-06 DIAGNOSIS — I82A11 Acute embolism and thrombosis of right axillary vein: Secondary | ICD-10-CM | POA: Diagnosis not present

## 2020-04-06 DIAGNOSIS — I509 Heart failure, unspecified: Secondary | ICD-10-CM | POA: Diagnosis not present

## 2020-04-06 DIAGNOSIS — M25561 Pain in right knee: Secondary | ICD-10-CM | POA: Diagnosis not present

## 2020-04-06 DIAGNOSIS — E1122 Type 2 diabetes mellitus with diabetic chronic kidney disease: Secondary | ICD-10-CM | POA: Diagnosis not present

## 2020-04-06 DIAGNOSIS — N189 Chronic kidney disease, unspecified: Secondary | ICD-10-CM | POA: Diagnosis not present

## 2020-04-06 DIAGNOSIS — I130 Hypertensive heart and chronic kidney disease with heart failure and stage 1 through stage 4 chronic kidney disease, or unspecified chronic kidney disease: Secondary | ICD-10-CM | POA: Diagnosis not present

## 2020-04-10 DIAGNOSIS — I130 Hypertensive heart and chronic kidney disease with heart failure and stage 1 through stage 4 chronic kidney disease, or unspecified chronic kidney disease: Secondary | ICD-10-CM | POA: Diagnosis not present

## 2020-04-10 DIAGNOSIS — I82A11 Acute embolism and thrombosis of right axillary vein: Secondary | ICD-10-CM | POA: Diagnosis not present

## 2020-04-10 DIAGNOSIS — I509 Heart failure, unspecified: Secondary | ICD-10-CM | POA: Diagnosis not present

## 2020-04-10 DIAGNOSIS — M25561 Pain in right knee: Secondary | ICD-10-CM | POA: Diagnosis not present

## 2020-04-10 DIAGNOSIS — N189 Chronic kidney disease, unspecified: Secondary | ICD-10-CM | POA: Diagnosis not present

## 2020-04-10 DIAGNOSIS — E1122 Type 2 diabetes mellitus with diabetic chronic kidney disease: Secondary | ICD-10-CM | POA: Diagnosis not present

## 2020-04-11 ENCOUNTER — Ambulatory Visit: Payer: Medicare Other | Admitting: *Deleted

## 2020-04-11 DIAGNOSIS — I10 Essential (primary) hypertension: Secondary | ICD-10-CM

## 2020-04-11 DIAGNOSIS — I480 Paroxysmal atrial fibrillation: Secondary | ICD-10-CM

## 2020-04-11 DIAGNOSIS — I5022 Chronic systolic (congestive) heart failure: Secondary | ICD-10-CM

## 2020-04-11 NOTE — Progress Notes (Signed)
DOS 04/03/20:  Internal Medicine Clinic Attending  Case discussed with Dr. Sharon Seller  At the time of the visit.  We reviewed the resident's history and exam and pertinent patient test results.  I agree with the assessment, diagnosis, and plan of care documented in the resident's note.

## 2020-04-11 NOTE — Chronic Care Management (AMB) (Signed)
  Chronic Care Management   Note  04/11/2020 Name: [name] MRN: [id number] DOB: 08-19-1965  Successful outreach to patient to schedule initial chronic care management assessment.  Follow up plan: Telephone follow up appointment with care management team member scheduled for:04/19/20 at 10:00 am.  Kelli Churn RN, CCM, [facility] RN Care Manager [phone]

## 2020-04-13 ENCOUNTER — Encounter: Payer: Medicare Other | Admitting: Student

## 2020-04-14 ENCOUNTER — Other Ambulatory Visit: Payer: Self-pay

## 2020-04-14 ENCOUNTER — Ambulatory Visit (INDEPENDENT_AMBULATORY_CARE_PROVIDER_SITE_OTHER): Payer: Medicare Other | Admitting: Student

## 2020-04-14 ENCOUNTER — Encounter: Payer: Self-pay | Admitting: Student

## 2020-04-14 DIAGNOSIS — Z940 Kidney transplant status: Secondary | ICD-10-CM

## 2020-04-14 DIAGNOSIS — I10 Essential (primary) hypertension: Secondary | ICD-10-CM | POA: Diagnosis not present

## 2020-04-14 DIAGNOSIS — I5020 Unspecified systolic (congestive) heart failure: Secondary | ICD-10-CM

## 2020-04-14 NOTE — Assessment & Plan Note (Addendum)
Patient states she saw her nephrologist on September 29.  She was informed that her creatinine is stable and that she should continue her transplant medications.  Denies any difficulty with urination.  Plan: --Continue follow-up with nephrology. --No records of recent labs from nephrology in system yet --BMP check at next office visit if no recent labs available --Scheduled appointment on 11/16 with transplant team

## 2020-04-14 NOTE — Assessment & Plan Note (Signed)
Patient BP elevated to 147/86. Patient states she has not taken her blood pressure medicines today. She denies any headaches, blurry vision or dizziness. Continue current antihypertensive regimen of Toprol 25 mg, hydralazine 10 mg every 8 hours and Lasix 80 mg daily.

## 2020-04-14 NOTE — Assessment & Plan Note (Signed)
Patient states she has been doing well since her last office visit. States her shortness of breath is improved and her lower extremity edema has significantly improved. Patient denies orthopnea, PND, chest pain, palpitations or wheezing. She saw her nephrologist last week but still has not heard from the heart failure clinic. Patient is back on her usual dose of Lasix daily. She has gained about 4 pounds since her last office visit but states she feels better.  Patient states she gets daily because of the hospital assessing her symptoms.  Plan: --Pending heart failure clinic referral --Continue Lasix 80 daily --Continue Toprol 25 mg daily

## 2020-04-14 NOTE — Progress Notes (Signed)
   CC: Heart failure follow-up  HPI:  [name] is a 54 y.o. female with PMH of DM, A. fib on Eliquis, CHF, HTN, anemia of chronic disease, secondary hyperparathyroidism and renal transplant who presents for heart failure follow-up.  Please see problem based charting for evaluation, assessment and plan.  Past Medical History:  Diagnosis Date  . Blood transfusion without reported diagnosis   . 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 GERD (gastroesophageal reflux disease)    medication induced  . Hypertension   . Renal failure    hx of  . 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 No acute distress. Well nourished, well developed. Head: Normocephalic, atraumatic. Cardiac: RRR. No murmurs, rubs or gallops. S1, S2. 1+ pitting edema to the mid-calf Respiratory: Lungs CTAB. No wheezing or crackles. No increased WOB. Abdominal: Soft, symmetric and non tender. No organomegaly. Normal bowel sounds Skin: Warm, dry and intact Extremities: Atraumatic. Full ROM. Pulse palpable. Neuro: A&O x 3. Moves all extremities Psych: Appropriate mood and affect. Normal judgement  Vitals:   04/14/20 1334  BP: (!) 147/86  Pulse: 85  Temp: 98.8 F (37.1 C)  TempSrc: Oral  SpO2: 100%  Weight: 235 lb (106.6 kg)    Assessment & Plan:   See Encounters Tab for problem based charting.  Patient seen with Dr. Louann Liv, MD, MPH

## 2020-04-14 NOTE — Patient Instructions (Signed)
Thank you, [name] for allowing [place] to provide your care today. Today we discussed your heart failure, kidney function and your blood pressure. We will have the HF clinic call you for an appointment.    Call the office if you start having difficulty breathing or increased swelling more than usual.   Please follow-up in 4 weeks.  Should you have any questions or concerns please call the internal medicine clinic at [phone].    Linwood Dibbles, MD, MPH [facility]   My Chart Access: [email or web address]   If you have not already done so, please get your COVID 19 vaccine  To schedule an appointment for a COVID vaccine choice any of the following: Go to [email or web address]   Go to [email or web address]                  Call [phone]                                     Call [phone] and select Option 2

## 2020-04-17 NOTE — Progress Notes (Signed)
Internal Medicine Clinic Attending  I saw and evaluated the patient.  I personally confirmed the key portions of the history and exam documented by Dr. Amponsah and I reviewed pertinent patient test results.  The assessment, diagnosis, and plan were formulated together and I agree with the documentation in the resident's note.  

## 2020-04-19 ENCOUNTER — Ambulatory Visit: Payer: Medicare Other | Admitting: *Deleted

## 2020-04-19 DIAGNOSIS — I130 Hypertensive heart and chronic kidney disease with heart failure and stage 1 through stage 4 chronic kidney disease, or unspecified chronic kidney disease: Secondary | ICD-10-CM | POA: Diagnosis not present

## 2020-04-19 DIAGNOSIS — M25561 Pain in right knee: Secondary | ICD-10-CM | POA: Diagnosis not present

## 2020-04-19 DIAGNOSIS — I10 Essential (primary) hypertension: Secondary | ICD-10-CM

## 2020-04-19 DIAGNOSIS — I480 Paroxysmal atrial fibrillation: Secondary | ICD-10-CM

## 2020-04-19 DIAGNOSIS — N189 Chronic kidney disease, unspecified: Secondary | ICD-10-CM | POA: Diagnosis not present

## 2020-04-19 DIAGNOSIS — E1122 Type 2 diabetes mellitus with diabetic chronic kidney disease: Secondary | ICD-10-CM | POA: Diagnosis not present

## 2020-04-19 DIAGNOSIS — I5020 Unspecified systolic (congestive) heart failure: Secondary | ICD-10-CM

## 2020-04-19 DIAGNOSIS — I509 Heart failure, unspecified: Secondary | ICD-10-CM | POA: Diagnosis not present

## 2020-04-19 DIAGNOSIS — I82A11 Acute embolism and thrombosis of right axillary vein: Secondary | ICD-10-CM | POA: Diagnosis not present

## 2020-04-19 NOTE — Chronic Care Management (AMB) (Addendum)
  Chronic Care Management   Note  04/19/2020 Name: [name] MRN: [id number] DOB: 1965/12/12  Successful outreach to patient with goal of completing initial chronic care management assessment via phone however a home health nurse arrived so was unable to finish assessment. During medication review it was determined that patient did not received a nebulizer machine when she was discharged form the hospital on 9/17. Will message the provider for an order. Called patient  back at 1:36 pm and left message requesting return call.  Follow up plan: The care management team will reach out to the patient again over the next 1-2 days.   Kelli Churn RN, CCM, [facility] RN Care Manager [phone]

## 2020-04-20 ENCOUNTER — Ambulatory Visit: Payer: Medicare Other | Admitting: *Deleted

## 2020-04-20 ENCOUNTER — Telehealth: Payer: Self-pay | Admitting: Internal Medicine

## 2020-04-20 ENCOUNTER — Telehealth: Payer: Medicare Other

## 2020-04-20 ENCOUNTER — Telehealth: Payer: Self-pay | Admitting: *Deleted

## 2020-04-20 DIAGNOSIS — I10 Essential (primary) hypertension: Secondary | ICD-10-CM

## 2020-04-20 DIAGNOSIS — I5020 Unspecified systolic (congestive) heart failure: Secondary | ICD-10-CM

## 2020-04-20 DIAGNOSIS — I480 Paroxysmal atrial fibrillation: Secondary | ICD-10-CM

## 2020-04-20 NOTE — Telephone Encounter (Signed)
I agree. Thanks.

## 2020-04-20 NOTE — Progress Notes (Signed)
Internal Medicine Clinic Resident  I have personally reviewed this encounter including the documentation in this note and/or discussed this patient with the care management provider. I will address any urgent items identified by the care management provider and will communicate my actions to the patient's PCP. I have reviewed the patient's CCM visit with my supervising attending, Dr Raines.  [name] D Feliciana Narayan, DO 04/20/2020    

## 2020-04-20 NOTE — Progress Notes (Signed)
Internal Medicine Clinic Attending  CCM services provided by the care management provider and their documentation were reviewed with Dr. Koleen Distance.  We reviewed the pertinent findings, urgent action items addressed by the resident and non-urgent items to be addressed by the PCP.  I agree with the assessment, diagnosis, and plan of care documented in the CCM and resident's note.  Oda Kilts, MD 04/20/2020

## 2020-04-20 NOTE — Telephone Encounter (Signed)
April, RN with [facility] called back. Verbal auth given for:   1 week 1 2 week 2 1 week 4 2 PRN visits  to work on education for heart failure. Will route to Red Team for agreement/denial. Hubbard Hartshorn, BSN, RN-BC

## 2020-04-20 NOTE — Chronic Care Management (AMB) (Signed)
  Chronic Care Management   Note  04/20/2020 Name: [name] MRN: [id number] DOB: [birth year]   Per patient request on 04/19/20, mailed 2 pill boxes, one large, one small, to patient's home address.  Follow up plan: Telephone follow up appointment with care management team member scheduled for:04/26/20 at 3:00 pm  Kelli Churn RN, CCM, [facility] RN Care Manager [phone]

## 2020-04-20 NOTE — Telephone Encounter (Addendum)
Returned call to April, RN with [facility]. No answer. Left message on VM requesting return call. Hubbard Hartshorn, RN, BSN

## 2020-04-20 NOTE — Telephone Encounter (Signed)
°  Chronic Care Management   Outreach Note  04/20/2020 Name: [name] MRN: [id number] DOB: 1966/05/03  Referred by: [name], DO Reason for referral : Chronic Care Management (HF, HTN, CAD, NIDDM, A Fib, hx of DVT)   A second unsuccessful telephone outreach was attempted today. The patient was referred to the case management team for assistance with care management and care coordination.   Follow Up Plan: A HIPAA compliant phone message was left for the patient providing contact information and requesting a return call. Included in message to patient that clinic provider has ordered home nebulizer machine and that [employer] will be contacting the patient about delivery of the machine and instruction in its use. If patietn does not return call , this CCM Rn will attmept anther outreach in 7-10 days.  Kelli Churn RN, CCM, [facility] RN Care Manager [phone]

## 2020-04-20 NOTE — Telephone Encounter (Signed)
Pls contact regarding VO [phone]

## 2020-04-20 NOTE — Addendum Note (Signed)
Addended by: Modena Nunnery D on: 04/20/2020 08:50 AM   Modules accepted: Orders

## 2020-04-21 DIAGNOSIS — N189 Chronic kidney disease, unspecified: Secondary | ICD-10-CM | POA: Diagnosis not present

## 2020-04-21 DIAGNOSIS — I130 Hypertensive heart and chronic kidney disease with heart failure and stage 1 through stage 4 chronic kidney disease, or unspecified chronic kidney disease: Secondary | ICD-10-CM | POA: Diagnosis not present

## 2020-04-21 DIAGNOSIS — E1122 Type 2 diabetes mellitus with diabetic chronic kidney disease: Secondary | ICD-10-CM | POA: Diagnosis not present

## 2020-04-21 DIAGNOSIS — I82A11 Acute embolism and thrombosis of right axillary vein: Secondary | ICD-10-CM | POA: Diagnosis not present

## 2020-04-21 DIAGNOSIS — M25561 Pain in right knee: Secondary | ICD-10-CM | POA: Diagnosis not present

## 2020-04-21 DIAGNOSIS — I509 Heart failure, unspecified: Secondary | ICD-10-CM | POA: Diagnosis not present

## 2020-04-21 NOTE — Progress Notes (Signed)
Internal Medicine Clinic Resident  I have personally reviewed this encounter including the documentation in this note and/or discussed this patient with the care management provider. I will address any urgent items identified by the care management provider and will communicate my actions to the patient's PCP. I have reviewed the patient's CCM visit with my supervising attending, Dr Rebeca Alert.  Iona Beard, MD 04/21/2020

## 2020-04-21 NOTE — Progress Notes (Signed)
Internal Medicine Clinic Attending  CCM services provided by the care management provider and their documentation were reviewed with Dr. Lisabeth Devoid.  We reviewed the pertinent findings, urgent action items addressed by the resident and non-urgent items to be addressed by the PCP.  I agree with the assessment, diagnosis, and plan of care documented in the CCM and resident's note.  Oda Kilts, MD 04/21/2020

## 2020-04-25 DIAGNOSIS — E1122 Type 2 diabetes mellitus with diabetic chronic kidney disease: Secondary | ICD-10-CM | POA: Diagnosis not present

## 2020-04-25 DIAGNOSIS — I130 Hypertensive heart and chronic kidney disease with heart failure and stage 1 through stage 4 chronic kidney disease, or unspecified chronic kidney disease: Secondary | ICD-10-CM | POA: Diagnosis not present

## 2020-04-25 DIAGNOSIS — M25561 Pain in right knee: Secondary | ICD-10-CM | POA: Diagnosis not present

## 2020-04-25 DIAGNOSIS — I82A11 Acute embolism and thrombosis of right axillary vein: Secondary | ICD-10-CM | POA: Diagnosis not present

## 2020-04-25 DIAGNOSIS — N189 Chronic kidney disease, unspecified: Secondary | ICD-10-CM | POA: Diagnosis not present

## 2020-04-25 DIAGNOSIS — I509 Heart failure, unspecified: Secondary | ICD-10-CM | POA: Diagnosis not present

## 2020-04-26 ENCOUNTER — Ambulatory Visit: Payer: Medicare Other | Admitting: *Deleted

## 2020-04-26 DIAGNOSIS — E1122 Type 2 diabetes mellitus with diabetic chronic kidney disease: Secondary | ICD-10-CM | POA: Diagnosis not present

## 2020-04-26 DIAGNOSIS — I82A11 Acute embolism and thrombosis of right axillary vein: Secondary | ICD-10-CM | POA: Diagnosis not present

## 2020-04-26 DIAGNOSIS — I130 Hypertensive heart and chronic kidney disease with heart failure and stage 1 through stage 4 chronic kidney disease, or unspecified chronic kidney disease: Secondary | ICD-10-CM | POA: Diagnosis not present

## 2020-04-26 DIAGNOSIS — I509 Heart failure, unspecified: Secondary | ICD-10-CM | POA: Diagnosis not present

## 2020-04-26 DIAGNOSIS — N189 Chronic kidney disease, unspecified: Secondary | ICD-10-CM | POA: Diagnosis not present

## 2020-04-26 DIAGNOSIS — M25561 Pain in right knee: Secondary | ICD-10-CM | POA: Diagnosis not present

## 2020-04-26 DIAGNOSIS — I10 Essential (primary) hypertension: Secondary | ICD-10-CM

## 2020-04-26 DIAGNOSIS — I480 Paroxysmal atrial fibrillation: Secondary | ICD-10-CM

## 2020-04-26 DIAGNOSIS — I5020 Unspecified systolic (congestive) heart failure: Secondary | ICD-10-CM

## 2020-04-26 DIAGNOSIS — I5022 Chronic systolic (congestive) heart failure: Secondary | ICD-10-CM

## 2020-04-26 NOTE — Chronic Care Management (AMB) (Signed)
Chronic Care Management   Follow Up Note   04/26/2020 Name: [name] MRN: [id number] DOB: 1965-07-28  Referred by: [name], DO Reason for referral : Chronic Care Management (HF, HTN, CAD, NIDDM, A Fib, hx of DVT)   [name] is a 54 y.o. year old female who is a primary care patient of [name], [name], DO. The CCM team was consulted for assistance with chronic disease management and care coordination needs.    Review of patient status, including review of consultants reports, relevant laboratory and other test results, and collaboration with appropriate care team members and the patient's provider was performed as part of comprehensive patient evaluation and provision of chronic care management services.    SDOH (Social Determinants of Health) assessments performed: Yes See Care Plan activities for detailed interventions related to SDOH)  SDOH Interventions     Most Recent Value  SDOH Interventions  Food Insecurity Interventions Other (Comment)  [will refer to CCM BSW]  Housing Interventions Intervention Not Indicated  Physical Activity Interventions Other (Comments)  [pt unable to exercise due to bilateral lymphedema in lower extremities but she is receiving HHPT]  Social Connections Interventions Intervention Not Indicated  Transportation Interventions Intervention Not Indicated       Outpatient Encounter Medications as of 04/26/2020  Medication Sig Note  . acetaminophen (TYLENOL) 325 MG tablet Take 2 tablets (650 mg total) by mouth every 6 (six) hours as needed for mild pain (or Fever >/= 101).   [name] albuterol (PROVENTIL) (2.5 MG/3ML) 0.083% nebulizer solution Take 3 mLs (2.5 mg total) by nebulization every 6 (six) hours as needed for wheezing or shortness of breath.   [name] apixaban (ELIQUIS) 5 MG TABS tablet Take 1 tablet (5 mg total) by mouth 2 (two) times daily.   [name] atorvastatin (LIPITOR) 40 MG tablet Take 1 tablet (40 mg total) by mouth daily.   .  calcitRIOL (ROCALTROL) 0.5 MCG capsule Take 1 capsule (0.5 mcg total) by mouth daily.   . calcium-vitamin D (OSCAL WITH D) 500-200 MG-UNIT tablet Take 1 tablet by mouth 2 (two) times daily.   . DULoxetine (CYMBALTA) 30 MG capsule Take 1 capsule (30 mg total) by mouth daily.   . ferrous sulfate 325 (65 FE) MG tablet Take 1 tablet (325 mg total) by mouth 2 (two) times daily with a meal.   . furosemide (LASIX) 80 MG tablet Take 1 tablet (80 mg total) by mouth daily.   . hydrALAZINE (APRESOLINE) 10 MG tablet Take 1 tablet (10 mg total) by mouth every 8 (eight) hours. 04/26/2020: Patient says she has been taking twice daily, she checked the label and verifeid she should be taking three times daily and says she will start taking it as directed  . mycophenolate (MYFORTIC) 180 MG EC tablet TAKE 3 TABLETS BY MOUTH TWICE DAILY   . pantoprazole (PROTONIX) 40 MG tablet Take 1 tablet (40 mg total) by mouth daily.   . predniSONE (DELTASONE) 2.5 MG tablet Take 1 tablet (2.5 mg total) by mouth daily with breakfast.   . tacrolimus (PROGRAF) 1 MG capsule Take 3 capsules (3 mg total) by mouth 2 (two) times daily.   . vitamin B-12 (CYANOCOBALAMIN) 1000 MCG tablet Take 1 tablet (1,000 mcg total) by mouth daily.   [name] apixaban (ELIQUIS) 5 MG TABS tablet Take 2 tablets (10mg ) twice daily for 7 days, then 1 tablet (5mg ) twice daily   . guaiFENesin-dextromethorphan (ROBITUSSIN DM) 100-10 MG/5ML syrup Take 5 mLs by mouth every  4 (four) hours as needed for cough. (Patient not taking: Reported on 04/19/2020)   . insulin aspart (NOVOLOG) 100 UNIT/ML injection Inject 0-20 Units into the skin 3 (three) times daily with meals. 04/26/2020: Very rarely requires  . isosorbide mononitrate (IMDUR) 30 MG 24 hr tablet Take 0.5 tablets (15 mg total) by mouth daily.   [name] loratadine (CLARITIN) 10 MG tablet Take 1 tablet (10 mg total) by mouth daily. 04/19/2020: Takes prn  . metoprolol succinate (TOPROL-XL) 25 MG 24 hr tablet Take 1 tablet (25  mg total) by mouth daily.   [name] triamcinolone ointment (KENALOG) 0.1 % Apply topically 2 (two) times daily as needed. (Patient not taking: Reported on 04/26/2020)    No facility-administered encounter medications on file as of 04/26/2020.     Objective:  Wt Readings from Last 3 Encounters:  04/14/20 235 lb (106.6 kg)  04/03/20 231 lb 1.6 oz (104.8 kg)  03/24/20 230 lb 13.2 oz (104.7 kg)   BP Readings from Last 3 Encounters:  04/14/20 (!) 147/86  04/03/20 (!) 156/86  03/24/20 122/64    Goals Addressed              This Visit's Progress     Patient Stated   .  " I don't know anything about an appointment with Dr Aundra Dubin on 05/16/20." (pt-stated)        CARE PLAN ENTRY (see longitudinal plan of care for additional care plan information)  Current Barriers:  . Care Coordination needs related to new patient appointment with Dr Aundra Dubin in a patient with HF, HTN, CAD, NIDDM, A Fib, hx of DVT  Nurse Case Manager Clinical Goal(s):  [name] Over the next 30 days, patient will attend all scheduled medical appointments: including new patient appointment with Dr Aundra Dubin  Interventions:  . Inter-disciplinary care team collaboration (see longitudinal plan of care) . Collaborated with scheduling staff Adolm Joseph CMA with Dr Claris Gladden office via [employer] regarding patient's lack of knowledge of new patient appointment on 05/16/20  Patient Self Care Activities:  . Patient verbalizes understanding of plan for CCM RN to contact scheduling staff at Dr. Claris Gladden office to notify patient of new patient appointment on 05/16/20 . Patient unaware of new patient appointment on 11/9 with Dr Aundra Dubin  Initial goal documentation     .  " I never received the home nebulizer machine that was ordered when I left the hospital in September." (pt-stated)        [place] (see longitudinal plan of care for additional care plan information)  Current Barriers:  . Care Coordination needs related to  needed DME/Supplies in a patient with Atrial Fibrillation, CHF, CAD, HTN, and DMII . Care Coordination needs related to securing home nebulizer machine in a patient with Atrial Fibrillation, CHF, CAD, HTN, and DMII- spoke with patient and she says the home nebulizer that was ordered on 10/14 has not been delivered  Nurse Case Manager Clinical Goal(s):  [name] Over the next 30 days, patient will verbalize understanding of plan to obtain needed DME.  . Over the next  30 days, patient will work with care guide and/or [employer]  to address needs related to DME   Interventions:  . Inter-disciplinary care team collaboration (see longitudinal plan of care) . Collaboration with/confirmation of receipt of DME orders at [place]  . Collaborated with clinic provider  regarding order for home nebulizer machine . Messaged Samara Deist with [employer] that order for nebulizer machine was written . Left message  for patient advising her [employer] will be contacting  her about delivery of home nebulizer machine . 10/20- messaged [employer] DME representatives via Epic community message that nebulizer has not been delivered  Patient Self Care Activities:  . Patient verbalizes understanding of plan to work with CCM RN in securing home nebulizer machine . Self administers medications as prescribed . Attends all scheduled provider appointments . Calls pharmacy for medication refills . Performs ADL's independently . Performs IADL's independently . Calls provider office for new concerns or questions . Unable to independently secure home nebulizer machine  Please see past updates related to this goal by clicking on the "Past Updates" button in the selected goal      .  "My home health nurse never brought me a scale and I don't have a home blood pressure monitor." (pt-stated)        [place] (see longitudinal plan of care for additional care plan information)  Current Barriers:  . Care Coordination needs related to needed  DME/Supplies in a patient with Atrial Fibrillation, CHF, CAD, HTN, HLD, and DMII . Care Coordination needs related to DME needs in a patient with Atrial Fibrillation, CHF, CAD, HTN, HLD, and DMII  Nurse Case Manager Clinical Goal(s):  [name] Over the next 30 days, patient will verbalize understanding of plan to obtain needed DME.  . Over the next 30 days, patient will work with care guide and/or  [place]  to address needs related to DME needs. . Over the next 30 days, patient will work with CCM RN and [facility] staff to address needs related to scales and home blood pressure monitor.  Interventions:  . Inter-disciplinary care team collaboration (see longitudinal plan of care) . Collaboration with/confirmation of receipt of DME orders at  [employer]. Nash Dimmer with [facility] referral coordinator Sharmon Revere via [employer] in Epic regarding procuring scales and home blood pressure monitor for patient  Patient Self Care Activities:  . Patient verbalizes understanding of plan to work with CCM team and [facility] staff in securing scales and home blood pressure monitor . Self administers medications as prescribed . Attends all scheduled provider appointments . Calls pharmacy for medication refills . Calls provider office for new concerns or questions . Unable to independently secure needed DME  Initial goal documentation         Plan:   The care management team will reach out to the patient again over the next 30-60 days.    Kelli Churn RN, CCM, [facility] RN Care Manager [phone]

## 2020-04-26 NOTE — Patient Instructions (Signed)
Visit Information It was nice speaking with you today. Goals Addressed              This Visit's Progress     Patient Stated   .  " I don't know anything about an appointment with Dr Aundra Dubin on 05/16/20." (pt-stated)        CARE PLAN ENTRY (see longitudinal plan of care for additional care plan information)  Current Barriers:  . Care Coordination needs related to new patient appointment with Dr Aundra Dubin in a patient with HF, HTN, CAD, NIDDM, A Fib, hx of DVT  Nurse Case Manager Clinical Goal(s):  [name] Over the next 30 days, patient will attend all scheduled medical appointments: including new patient appointment with Dr Aundra Dubin  Interventions:  . Inter-disciplinary care team collaboration (see longitudinal plan of care) . Collaborated with scheduling staff Adolm Joseph CMA with Dr Claris Gladden office via [employer] regarding patient's lack of knowledge of new patient appointment on 05/16/20  Patient Self Care Activities:  . Patient verbalizes understanding of plan for CCM RN to contact scheduling staff at Dr. Claris Gladden office to notify patient of new patient appointment on 05/16/20 . Patient unaware of new patient appointment on 11/9 with Dr Aundra Dubin  Initial goal documentation     .  " I never received the home nebulizer machine that was ordered when I left the hospital in September." (pt-stated)        [place] (see longitudinal plan of care for additional care plan information)  Current Barriers:  . Care Coordination needs related to needed DME/Supplies in a patient with Atrial Fibrillation, CHF, CAD, HTN, and DMII . Care Coordination needs related to securing home nebulizer machine in a patient with Atrial Fibrillation, CHF, CAD, HTN, and DMII- spoke with patient and she says the home nebulizer that was ordered on 10/14 has not been delivered  Nurse Case Manager Clinical Goal(s):  [name] Over the next 30 days, patient will verbalize understanding of plan to obtain needed DME.    . Over the next  30 days, patient will work with care guide and/or [employer]  to address needs related to DME   Interventions:  . Inter-disciplinary care team collaboration (see longitudinal plan of care) . Collaboration with/confirmation of receipt of DME orders at [place]  . Collaborated with clinic provider  regarding order for home nebulizer machine . Messaged Samara Deist with [employer] that order for nebulizer machine was written . Left message for patient advising her [employer] will be contacting  her about delivery of home nebulizer machine . 10/20- messaged [employer]ME representatives via Epic community message that nebulizer has not been delivered  Patient Self Care Activities:  . Patient verbalizes understanding of plan to work with CCM RN in securing home nebulizer machine . Self administers medications as prescribed . Attends all scheduled provider appointments . Calls pharmacy for medication refills . Performs ADL's independently . Performs IADL's independently . Calls provider office for new concerns or questions . Unable to independently secure home nebulizer machine  Please see past updates related to this goal by clicking on the "Past Updates" button in the selected goal      .  "My home health nurse never brought me a scale and I don't have a home blood pressure monitor." (pt-stated)        [place] (see longitudinal plan of care for additional care plan information)  Current Barriers:  . Care Coordination needs related to needed DME/Supplies in a  patient with Atrial Fibrillation, CHF, CAD, HTN, HLD, and DMII . Care Coordination needs related to DME needs in a patient with Atrial Fibrillation, CHF, CAD, HTN, HLD, and DMII  Nurse Case Manager Clinical Goal(s):  [name] Over the next 30 days, patient will verbalize understanding of plan to obtain needed DME.  . Over the next 30 days, patient will work with care guide and/or  [place]  to address needs related to  DME needs. . Over the next 30 days, patient will work with CCM RN and [facility] staff to address needs related to scales and home blood pressure monitor.  Interventions:  . Inter-disciplinary care team collaboration (see longitudinal plan of care) . Collaboration with/confirmation of receipt of DME orders at  [employer]. Nash Dimmer with [facility] referral coordinator Sharmon Revere via [employer] in Epic regarding procuring scales and home blood pressure monitor for patient  Patient Self Care Activities:  . Patient verbalizes understanding of plan to work with CCM team and [facility] staff in securing scales and home blood pressure monitor . Self administers medications as prescribed . Attends all scheduled provider appointments . Calls pharmacy for medication refills . Calls provider office for new concerns or questions . Unable to independently secure needed DME  Initial goal documentation        The patient verbalized understanding of instructions provided today and declined a print copy of patient instruction materials.   The care management team will reach out to the patient again over the next 30-60 days.   Kelli Churn RN, CCM, [facility] RN Care Manager [phone]

## 2020-04-27 ENCOUNTER — Ambulatory Visit: Payer: Medicare Other | Admitting: *Deleted

## 2020-04-27 DIAGNOSIS — N1832 Chronic kidney disease, stage 3b: Secondary | ICD-10-CM | POA: Diagnosis not present

## 2020-04-27 DIAGNOSIS — D631 Anemia in chronic kidney disease: Secondary | ICD-10-CM | POA: Diagnosis not present

## 2020-04-27 DIAGNOSIS — I82A11 Acute embolism and thrombosis of right axillary vein: Secondary | ICD-10-CM

## 2020-04-27 DIAGNOSIS — N2581 Secondary hyperparathyroidism of renal origin: Secondary | ICD-10-CM | POA: Diagnosis not present

## 2020-04-27 DIAGNOSIS — I119 Hypertensive heart disease without heart failure: Secondary | ICD-10-CM | POA: Diagnosis not present

## 2020-04-27 DIAGNOSIS — I480 Paroxysmal atrial fibrillation: Secondary | ICD-10-CM

## 2020-04-27 DIAGNOSIS — I43 Cardiomyopathy in diseases classified elsewhere: Secondary | ICD-10-CM | POA: Diagnosis not present

## 2020-04-27 DIAGNOSIS — E785 Hyperlipidemia, unspecified: Secondary | ICD-10-CM | POA: Diagnosis not present

## 2020-04-27 DIAGNOSIS — I5020 Unspecified systolic (congestive) heart failure: Secondary | ICD-10-CM

## 2020-04-27 DIAGNOSIS — I10 Essential (primary) hypertension: Secondary | ICD-10-CM

## 2020-04-27 DIAGNOSIS — E118 Type 2 diabetes mellitus with unspecified complications: Secondary | ICD-10-CM | POA: Diagnosis not present

## 2020-04-27 DIAGNOSIS — R809 Proteinuria, unspecified: Secondary | ICD-10-CM | POA: Diagnosis not present

## 2020-04-27 DIAGNOSIS — N189 Chronic kidney disease, unspecified: Secondary | ICD-10-CM | POA: Diagnosis not present

## 2020-04-27 DIAGNOSIS — Z940 Kidney transplant status: Secondary | ICD-10-CM

## 2020-04-27 DIAGNOSIS — N179 Acute kidney failure, unspecified: Secondary | ICD-10-CM | POA: Diagnosis not present

## 2020-04-27 DIAGNOSIS — I129 Hypertensive chronic kidney disease with stage 1 through stage 4 chronic kidney disease, or unspecified chronic kidney disease: Secondary | ICD-10-CM | POA: Diagnosis not present

## 2020-04-27 DIAGNOSIS — Z79899 Other long term (current) drug therapy: Secondary | ICD-10-CM | POA: Diagnosis not present

## 2020-04-27 NOTE — Chronic Care Management (AMB) (Signed)
Chronic Care Management   Follow Up Note   04/27/2020 Name: RAINEE SWEATT MRN: [id number] DOB: 1966-04-25  Referred by: [name], DO Reason for referral : Chronic Care Management (HF, HTN, CAD, NIDDM, A Fib, hx of DVT)   JERIANN SAYRES is a 54 y.o. year old female who is a primary care patient of [name], [name], DO. The CCM team was consulted for assistance with chronic disease management and care coordination needs.    Review of patient status, including review of consultants reports, relevant laboratory and other test results, and collaboration with appropriate care team members and the patient's provider was performed as part of comprehensive patient evaluation and provision of chronic care management services.    SDOH (Social Determinants of Health) assessments performed: No See Care Plan activities for detailed interventions related to [facility])     Outpatient Encounter Medications as of 04/27/2020  Medication Sig Note  . acetaminophen (TYLENOL) 325 MG tablet Take 2 tablets (650 mg total) by mouth every 6 (six) hours as needed for mild pain (or Fever >/= 101).   [name] albuterol (PROVENTIL) (2.5 MG/3ML) 0.083% nebulizer solution Take 3 mLs (2.5 mg total) by nebulization every 6 (six) hours as needed for wheezing or shortness of breath.   [name] apixaban (ELIQUIS) 5 MG TABS tablet Take 2 tablets (10mg ) twice daily for 7 days, then 1 tablet (5mg ) twice daily   . apixaban (ELIQUIS) 5 MG TABS tablet Take 1 tablet (5 mg total) by mouth 2 (two) times daily.   [name] atorvastatin (LIPITOR) 40 MG tablet Take 1 tablet (40 mg total) by mouth daily.   . calcitRIOL (ROCALTROL) 0.5 MCG capsule Take 1 capsule (0.5 mcg total) by mouth daily.   . calcium-vitamin D (OSCAL WITH D) 500-200 MG-UNIT tablet Take 1 tablet by mouth 2 (two) times daily.   . DULoxetine (CYMBALTA) 30 MG capsule Take 1 capsule (30 mg total) by mouth daily.   . ferrous sulfate 325 (65 FE) MG tablet Take 1 tablet (325 mg total) by  mouth 2 (two) times daily with a meal.   . furosemide (LASIX) 80 MG tablet Take 1 tablet (80 mg total) by mouth daily.   [name] guaiFENesin-dextromethorphan (ROBITUSSIN DM) 100-10 MG/5ML syrup Take 5 mLs by mouth every 4 (four) hours as needed for cough. (Patient not taking: Reported on 04/19/2020)   . hydrALAZINE (APRESOLINE) 10 MG tablet Take 1 tablet (10 mg total) by mouth every 8 (eight) hours. 04/26/2020: Patient says she has been taking twice daily, she checked the label and verifeid she should be taking three times daily and says she will start taking it as directed  . insulin aspart (NOVOLOG) 100 UNIT/ML injection Inject 0-20 Units into the skin 3 (three) times daily with meals. 04/26/2020: Very rarely requires  . isosorbide mononitrate (IMDUR) 30 MG 24 hr tablet Take 0.5 tablets (15 mg total) by mouth daily.   [name] loratadine (CLARITIN) 10 MG tablet Take 1 tablet (10 mg total) by mouth daily. 04/19/2020: Takes prn  . metoprolol succinate (TOPROL-XL) 25 MG 24 hr tablet Take 1 tablet (25 mg total) by mouth daily.   . mycophenolate (MYFORTIC) 180 MG EC tablet TAKE 3 TABLETS BY MOUTH TWICE DAILY   . pantoprazole (PROTONIX) 40 MG tablet Take 1 tablet (40 mg total) by mouth daily.   . predniSONE (DELTASONE) 2.5 MG tablet Take 1 tablet (2.5 mg total) by mouth daily with breakfast.   . tacrolimus (PROGRAF) 1 MG capsule Take 3 capsules (  3 mg total) by mouth 2 (two) times daily.   [name] triamcinolone ointment (KENALOG) 0.1 % Apply topically 2 (two) times daily as needed. (Patient not taking: Reported on 04/26/2020)   . vitamin B-12 (CYANOCOBALAMIN) 1000 MCG tablet Take 1 tablet (1,000 mcg total) by mouth daily.    No facility-administered encounter medications on file as of 04/27/2020.     Objective:  Wt Readings from Last 3 Encounters:  04/14/20 235 lb (106.6 kg)  04/03/20 231 lb 1.6 oz (104.8 kg)  03/24/20 230 lb 13.2 oz (104.7 kg)   BP Readings from Last 3 Encounters:  04/14/20 (!) 147/86  04/03/20  (!) 156/86  03/24/20 122/64    Goals Addressed              This Visit's Progress     Patient Stated   .  "My home health nurse never brought me a scale and I don't have a home blood pressure monitor." (pt-stated)        CARE PLAN ENTRY (see longitudinal plan of care for additional care plan information)  Current Barriers:  . Care Coordination needs related to needed DME/Supplies in a patient with Atrial Fibrillation, CHF, CAD, HTN, HLD, and DMII . Care Coordination needs related to DME needs in a patient with Atrial Fibrillation, CHF, CAD, HTN, HLD, and DMII  Nurse Case Manager Clinical Goal(s):  [name] Over the next 30 days, patient will verbalize understanding of plan to obtain needed DME.  . Over the next 30 days, patient will work with care guide and/or  [place]  to address needs related to DME needs. . Over the next 30 days, patient will work with CCM RN and [facility] staff to address needs related to scales and home blood pressure monitor.  Interventions:  . Inter-disciplinary care team collaboration (see longitudinal plan of care) . Collaboration with/confirmation of receipt of DME orders at  [employer]. Nash Dimmer with [facility] Referral Coordinator Sharmon Revere via [employer] in Epic regarding procuring scales and home blood pressure monitor for patient . 04/27/20- [place] Referral Coordinator at [phone] to discuss patient's DME needs. She states scales are shipped from a Medicare contracted company and it has been taking several weeks for patients to receive their DME. Malachy Mood says [facility] is also able to provide a home blood pressure monitor for the patient. Malachy Mood will follow up on the DME needs and contact this CCM RN with an update.   Patient Self Care Activities:  . Patient verbalizes understanding of plan to work with CCM team and [facility] staff in securing scales  and home blood pressure monitor . Self administers medications as prescribed . Attends all scheduled provider appointments . Calls pharmacy for medication refills . Calls provider office for new concerns or questions . Unable to independently secure needed DME  Initial goal documentation         Plan:   The care management team will reach out to the patient again over the next 30 days.    Kelli Churn RN, CCM, [facility] RN Care Manager [phone]

## 2020-04-27 NOTE — Chronic Care Management (AMB) (Signed)
Chronic Care Management   Follow Up Note   04/27/2020 Name: [name] MRN: [id number] DOB: 11-Feb-1966  Referred by: [name], DO Reason for referral : Chronic Care Management (HF, HTN, CAD, NIDDM, A Fib, hx of DVT)   [name] is a 54 y.o. year old female who is a primary care patient of [name], [name], DO. The CCM team was consulted for assistance with chronic disease management and care coordination needs.    Review of patient status, including review of consultants reports, relevant laboratory and other test results, and collaboration with appropriate care team members and the patient's provider was performed as part of comprehensive patient evaluation and provision of chronic care management services.    SDOH (Social Determinants of Health) assessments performed: No See Care Plan activities for detailed interventions related to [facility])     Outpatient Encounter Medications as of 04/27/2020  Medication Sig Note  . acetaminophen (TYLENOL) 325 MG tablet Take 2 tablets (650 mg total) by mouth every 6 (six) hours as needed for mild pain (or Fever >/= 101).   [name] albuterol (PROVENTIL) (2.5 MG/3ML) 0.083% nebulizer solution Take 3 mLs (2.5 mg total) by nebulization every 6 (six) hours as needed for wheezing or shortness of breath.   [name] apixaban (ELIQUIS) 5 MG TABS tablet Take 2 tablets (10mg ) twice daily for 7 days, then 1 tablet (5mg ) twice daily   . apixaban (ELIQUIS) 5 MG TABS tablet Take 1 tablet (5 mg total) by mouth 2 (two) times daily.   [name] atorvastatin (LIPITOR) 40 MG tablet Take 1 tablet (40 mg total) by mouth daily.   . calcitRIOL (ROCALTROL) 0.5 MCG capsule Take 1 capsule (0.5 mcg total) by mouth daily.   . calcium-vitamin D (OSCAL WITH D) 500-200 MG-UNIT tablet Take 1 tablet by mouth 2 (two) times daily.   . DULoxetine (CYMBALTA) 30 MG capsule Take 1 capsule (30 mg total) by mouth daily.   . ferrous sulfate 325 (65 FE) MG tablet Take 1 tablet (325 mg total) by  mouth 2 (two) times daily with a meal.   . furosemide (LASIX) 80 MG tablet Take 1 tablet (80 mg total) by mouth daily.   [name] guaiFENesin-dextromethorphan (ROBITUSSIN DM) 100-10 MG/5ML syrup Take 5 mLs by mouth every 4 (four) hours as needed for cough. (Patient not taking: Reported on 04/19/2020)   . hydrALAZINE (APRESOLINE) 10 MG tablet Take 1 tablet (10 mg total) by mouth every 8 (eight) hours. 04/26/2020: Patient says she has been taking twice daily, she checked the label and verifeid she should be taking three times daily and says she will start taking it as directed  . insulin aspart (NOVOLOG) 100 UNIT/ML injection Inject 0-20 Units into the skin 3 (three) times daily with meals. 04/26/2020: Very rarely requires  . isosorbide mononitrate (IMDUR) 30 MG 24 hr tablet Take 0.5 tablets (15 mg total) by mouth daily.   [name] loratadine (CLARITIN) 10 MG tablet Take 1 tablet (10 mg total) by mouth daily. 04/19/2020: Takes prn  . metoprolol succinate (TOPROL-XL) 25 MG 24 hr tablet Take 1 tablet (25 mg total) by mouth daily.   . mycophenolate (MYFORTIC) 180 MG EC tablet TAKE 3 TABLETS BY MOUTH TWICE DAILY   . pantoprazole (PROTONIX) 40 MG tablet Take 1 tablet (40 mg total) by mouth daily.   . predniSONE (DELTASONE) 2.5 MG tablet Take 1 tablet (2.5 mg total) by mouth daily with breakfast.   . tacrolimus (PROGRAF) 1 MG capsule Take 3 capsules (  3 mg total) by mouth 2 (two) times daily.   [name] triamcinolone ointment (KENALOG) 0.1 % Apply topically 2 (two) times daily as needed. (Patient not taking: Reported on 04/26/2020)   . vitamin B-12 (CYANOCOBALAMIN) 1000 MCG tablet Take 1 tablet (1,000 mcg total) by mouth daily.    No facility-administered encounter medications on file as of 04/27/2020.     Objective:   Goals Addressed              This Visit's Progress     Patient Stated   .  " I never received the home nebulizer machine that was ordered when I left the hospital in September." (pt-stated)         [place] (see longitudinal plan of care for additional care plan information)  Current Barriers:  . Care Coordination needs related to needed DME/Supplies in a patient with Atrial Fibrillation, CHF, CAD, HTN, and DMII . Care Coordination needs related to securing home nebulizer machine in a patient with Atrial Fibrillation, CHF, CAD, HTN, and DMII- spoke with patient and she says the home nebulizer that was ordered on 10/14 has not been delivered. 04/27/20- Received call from [place] DME representative Crystal advising that the diagnosis code associated with the nebulizer order will not support the need for the nebulizer, states the condition must be an ongoing chronic lung condition  Nurse Case Manager Clinical Goal(s):  [name] Over the next 30 days, patient will verbalize understanding of plan to obtain needed DME.  . Over the next  30 days, patient will work with care guide and/or [employer]  to address needs related to DME   Interventions:  . Inter-disciplinary care team collaboration (see longitudinal plan of care) . Collaboration with/confirmation of receipt of DME orders at [place]  . Collaborated with clinic provider  regarding order for home nebulizer machine . Messaged Samara Deist with [employer] that order for nebulizer machine was written . Left message for patient advising her [employer] will be contacting  her about delivery of home nebulizer machine . 10/20- messaged [employer]ME representatives via Epic community message that nebulizer has not been delivered . 10/21- returned call to [place] at [phone] and provided ICD 10 codes for acute respiratory failure and multiple pulmonary nodules in addition to heart failure with reduced ejection fraction. Per Crystal none of these codes will support need for nebulizer. . 10/21- Messaged clinic provider of need for diagnosis of chronic lung condition to support need for nebulizer  Patient Self Care Activities:  . Patient  verbalizes understanding of plan to work with CCM RN in securing home nebulizer machine . Self administers medications as prescribed . Attends all scheduled provider appointments . Calls pharmacy for medication refills . Performs ADL's independently . Performs IADL's independently . Calls provider office for new concerns or questions . Unable to independently secure home nebulizer machine  Please see past updates related to this goal by clicking on the "Past Updates" button in the selected goal          Plan:   The care management team will reach out to the patient again over the next 30-60 days.    Kelli Churn RN, CCM, [facility] RN Care Manager [phone]

## 2020-04-28 ENCOUNTER — Ambulatory Visit: Payer: Medicare Other | Admitting: *Deleted

## 2020-04-28 DIAGNOSIS — N189 Chronic kidney disease, unspecified: Secondary | ICD-10-CM | POA: Diagnosis not present

## 2020-04-28 DIAGNOSIS — I5020 Unspecified systolic (congestive) heart failure: Secondary | ICD-10-CM

## 2020-04-28 DIAGNOSIS — E1122 Type 2 diabetes mellitus with diabetic chronic kidney disease: Secondary | ICD-10-CM | POA: Diagnosis not present

## 2020-04-28 DIAGNOSIS — I82A11 Acute embolism and thrombosis of right axillary vein: Secondary | ICD-10-CM | POA: Diagnosis not present

## 2020-04-28 DIAGNOSIS — M25561 Pain in right knee: Secondary | ICD-10-CM | POA: Diagnosis not present

## 2020-04-28 DIAGNOSIS — I10 Essential (primary) hypertension: Secondary | ICD-10-CM

## 2020-04-28 DIAGNOSIS — I130 Hypertensive heart and chronic kidney disease with heart failure and stage 1 through stage 4 chronic kidney disease, or unspecified chronic kidney disease: Secondary | ICD-10-CM | POA: Diagnosis not present

## 2020-04-28 DIAGNOSIS — I480 Paroxysmal atrial fibrillation: Secondary | ICD-10-CM

## 2020-04-28 DIAGNOSIS — I509 Heart failure, unspecified: Secondary | ICD-10-CM | POA: Diagnosis not present

## 2020-04-28 DIAGNOSIS — Z940 Kidney transplant status: Secondary | ICD-10-CM

## 2020-04-28 NOTE — Chronic Care Management (AMB) (Addendum)
Chronic Care Management   Follow Up Note   04/28/2020 Name: [name] MRN: [id number] DOB: 1965/07/10  Referred by: [name], DO Reason for referral : Chronic Care Management (HF, HTN, CAD, NIDDM, A Fib, hx of DVT ( in r  arm) )   [name] is a 54 y.o. year old female who is a primary care patient of [name], [name], DO. The CCM team was consulted for assistance with chronic disease management and care coordination needs.    Review of patient status, including review of consultants reports, relevant laboratory and other test results, and collaboration with appropriate care team members and the patient's provider was performed as part of comprehensive patient evaluation and provision of chronic care management services.    SDOH (Social Determinants of Health) assessments performed: No See Care Plan activities for detailed interventions related to [facility])     Outpatient Encounter Medications as of 04/28/2020  Medication Sig Note  . acetaminophen (TYLENOL) 325 MG tablet Take 2 tablets (650 mg total) by mouth every 6 (six) hours as needed for mild pain (or Fever >/= 101).   [name] albuterol (PROVENTIL) (2.5 MG/3ML) 0.083% nebulizer solution Take 3 mLs (2.5 mg total) by nebulization every 6 (six) hours as needed for wheezing or shortness of breath.   [name] apixaban (ELIQUIS) 5 MG TABS tablet Take 2 tablets (10mg ) twice daily for 7 days, then 1 tablet (5mg ) twice daily   . apixaban (ELIQUIS) 5 MG TABS tablet Take 1 tablet (5 mg total) by mouth 2 (two) times daily.   [name] atorvastatin (LIPITOR) 40 MG tablet Take 1 tablet (40 mg total) by mouth daily.   . calcitRIOL (ROCALTROL) 0.5 MCG capsule Take 1 capsule (0.5 mcg total) by mouth daily.   . calcium-vitamin D (OSCAL WITH D) 500-200 MG-UNIT tablet Take 1 tablet by mouth 2 (two) times daily.   . DULoxetine (CYMBALTA) 30 MG capsule Take 1 capsule (30 mg total) by mouth daily.   . ferrous sulfate 325 (65 FE) MG tablet Take 1 tablet (325  mg total) by mouth 2 (two) times daily with a meal.   . furosemide (LASIX) 80 MG tablet Take 1 tablet (80 mg total) by mouth daily.   [name] guaiFENesin-dextromethorphan (ROBITUSSIN DM) 100-10 MG/5ML syrup Take 5 mLs by mouth every 4 (four) hours as needed for cough. (Patient not taking: Reported on 04/19/2020)   . hydrALAZINE (APRESOLINE) 10 MG tablet Take 1 tablet (10 mg total) by mouth every 8 (eight) hours. 04/26/2020: Patient says she has been taking twice daily, she checked the label and verifeid she should be taking three times daily and says she will start taking it as directed  . insulin aspart (NOVOLOG) 100 UNIT/ML injection Inject 0-20 Units into the skin 3 (three) times daily with meals. 04/26/2020: Very rarely requires  . isosorbide mononitrate (IMDUR) 30 MG 24 hr tablet Take 0.5 tablets (15 mg total) by mouth daily.   [name] loratadine (CLARITIN) 10 MG tablet Take 1 tablet (10 mg total) by mouth daily. 04/19/2020: Takes prn  . metoprolol succinate (TOPROL-XL) 25 MG 24 hr tablet Take 1 tablet (25 mg total) by mouth daily.   . mycophenolate (MYFORTIC) 180 MG EC tablet TAKE 3 TABLETS BY MOUTH TWICE DAILY   . pantoprazole (PROTONIX) 40 MG tablet Take 1 tablet (40 mg total) by mouth daily.   . predniSONE (DELTASONE) 2.5 MG tablet Take 1 tablet (2.5 mg total) by mouth daily with breakfast.   . tacrolimus (PROGRAF)  1 MG capsule Take 3 capsules (3 mg total) by mouth 2 (two) times daily.   [name] triamcinolone ointment (KENALOG) 0.1 % Apply topically 2 (two) times daily as needed. (Patient not taking: Reported on 04/26/2020)   . vitamin B-12 (CYANOCOBALAMIN) 1000 MCG tablet Take 1 tablet (1,000 mcg total) by mouth daily.    No facility-administered encounter medications on file as of 04/28/2020.     Objective:  Wt Readings from Last 3 Encounters:  04/14/20 235 lb (106.6 kg)  04/03/20 231 lb 1.6 oz (104.8 kg)  03/24/20 230 lb 13.2 oz (104.7 kg)   BP Readings from Last 3 Encounters:  04/14/20 (!) 147/86   04/03/20 (!) 156/86  03/24/20 122/64    Goals Addressed              This Visit's Progress     Patient Stated   .  " I never received the home nebulizer machine that was ordered when I left the hospital in September." (pt-stated)        [place] (see longitudinal plan of care for additional care plan information)  Current Barriers:  . Care Coordination needs related to needed DME/Supplies in a patient with Atrial Fibrillation, CHF, CAD, HTN, and DMII . Care Coordination needs related to securing home nebulizer machine in a patient with Atrial Fibrillation, CHF, CAD, HTN, and DMII- spoke with patient and she says the home nebulizer that was ordered on 10/14 has not been delivered. 04/27/20- Received call from [place] DME representative Crystal advising that the diagnosis code associated with the nebulizer order will not support the need for the nebulizer, states the condition must be an ongoing chronic lung condition  Nurse Case Manager Clinical Goal(s):  [name] Over the next 30 days, patient will verbalize understanding of plan to obtain needed DME.  . Over the next  30 days, patient will work with care guide and/or [employer]  to address needs related to DME   Interventions:  . Inter-disciplinary care team collaboration (see longitudinal plan of care) . Collaboration with/confirmation of receipt of DME orders at [place]  . Collaborated with clinic provider  regarding order for home nebulizer machine . Messaged Samara Deist with [employer] that order for nebulizer machine was written . Left message for patient advising her [employer] will be contacting  her about delivery of home nebulizer machine . 10/20- messaged [employer] DME representatives via Epic community message that nebulizer has not been delivered . 10/21- returned call to [place] at [phone] and provided ICD 10 codes for acute respiratory failure and multiple pulmonary nodules in addition to heart failure with  reduced ejection fraction. Per Crystal none of these codes will support need for nebulizer. . 10/21- Messaged clinic provider of need for diagnosis of chronic lung condition to support need for nebulizer . 10/22 left message for patient on her mobile number explaining why nebulizer machine has not been delivered ( need ICD 10 code from provider that will support need for nebulizer Rx)   Patient Self Care Activities:  . Patient verbalizes understanding of plan to work with CCM RN in securing home nebulizer machine . Self administers medications as prescribed . Attends all scheduled provider appointments . Calls pharmacy for medication refills . Performs ADL's independently . Performs IADL's independently . Calls provider office for new concerns or questions . Unable to independently secure home nebulizer machine  Please see past updates related to this goal by clicking on the "Past Updates" button in the selected goal      .  "  My home health nurse never brought me a scale and I don't have a home blood pressure monitor." (pt-stated)        CARE PLAN ENTRY (see longitudinal plan of care for additional care plan information)  Current Barriers:  . Care Coordination needs related to needed DME/Supplies in a patient with Atrial Fibrillation, CHF, CAD, HTN, HLD, and DMII . Care Coordination needs related to DME needs in a patient with Atrial Fibrillation, CHF, CAD, HTN, HLD, and DMII  Nurse Case Manager Clinical Goal(s):  [name] Over the next 30 days, patient will verbalize understanding of plan to obtain needed DME.  . Over the next 30 days, patient will work with care guide and/or  [place]  to address needs related to DME needs. . Over the next 30 days, patient will work with CCM RN and [facility] staff to address needs related to scales and home blood pressure monitor.  Interventions:  . Inter-disciplinary care team collaboration (see longitudinal plan of  care) . Collaboration with/confirmation of receipt of DME orders at  [employer]. Nash Dimmer with [facility] Referral Coordinator Sharmon Revere via [employer] in Epic regarding procuring scales and home blood pressure monitor for patient . 04/27/20- [place] Referral Coordinator at [phone] to discuss patient's DME needs. She states scales are shipped from a Medicare contracted company and it has been taking several weeks for patients to receive their DME. Malachy Mood says [employer] is also able to provide a home blood pressure monitor for the patient. Malachy Mood will follow up on the DME needs and contact this CCM RN with an update.  . 04/28/20- left message for patient on her mobile number advising her that Sharmon Revere with [employer] will work to expedite securing scales and home BP monitor for patient  Patient Self Care Activities:  . Patient verbalizes understanding of plan to work with CCM team and [facility] staff in securing scales and home blood pressure monitor . Self administers medications as prescribed . Attends all scheduled provider appointments . Calls pharmacy for medication refills . Calls provider office for new concerns or questions . Unable to independently secure needed DME  Please see past updates related to this goal by clicking on the "Past Updates" button in the selected goal         Plan:   Telephone follow up appointment with care management team member scheduled for:05/15/20 at 11:00 am   Kelli Churn RN, CCM, [facility] RN Care Manager [phone]

## 2020-04-30 DIAGNOSIS — I4891 Unspecified atrial fibrillation: Secondary | ICD-10-CM | POA: Diagnosis not present

## 2020-04-30 DIAGNOSIS — M25562 Pain in left knee: Secondary | ICD-10-CM | POA: Diagnosis not present

## 2020-04-30 DIAGNOSIS — I82A11 Acute embolism and thrombosis of right axillary vein: Secondary | ICD-10-CM | POA: Diagnosis not present

## 2020-04-30 DIAGNOSIS — N189 Chronic kidney disease, unspecified: Secondary | ICD-10-CM | POA: Diagnosis not present

## 2020-04-30 DIAGNOSIS — I509 Heart failure, unspecified: Secondary | ICD-10-CM | POA: Diagnosis not present

## 2020-04-30 DIAGNOSIS — E1122 Type 2 diabetes mellitus with diabetic chronic kidney disease: Secondary | ICD-10-CM | POA: Diagnosis not present

## 2020-04-30 DIAGNOSIS — Z940 Kidney transplant status: Secondary | ICD-10-CM | POA: Diagnosis not present

## 2020-04-30 DIAGNOSIS — I130 Hypertensive heart and chronic kidney disease with heart failure and stage 1 through stage 4 chronic kidney disease, or unspecified chronic kidney disease: Secondary | ICD-10-CM | POA: Diagnosis not present

## 2020-04-30 DIAGNOSIS — K219 Gastro-esophageal reflux disease without esophagitis: Secondary | ICD-10-CM | POA: Diagnosis not present

## 2020-04-30 DIAGNOSIS — M5136 Other intervertebral disc degeneration, lumbar region: Secondary | ICD-10-CM | POA: Diagnosis not present

## 2020-04-30 DIAGNOSIS — M25561 Pain in right knee: Secondary | ICD-10-CM | POA: Diagnosis not present

## 2020-04-30 DIAGNOSIS — F17200 Nicotine dependence, unspecified, uncomplicated: Secondary | ICD-10-CM | POA: Diagnosis not present

## 2020-05-01 NOTE — Progress Notes (Signed)
Internal Medicine Clinic Attending  CCM services provided by the care management provider and their documentation were discussed with Dr. Marianna Payment. We reviewed the pertinent findings, urgent action items addressed by the resident and non-urgent items to be addressed by the PCP.  I agree with the assessment, diagnosis, and plan of care documented in the CCM and resident's note.  Axel Filler, MD 05/01/2020

## 2020-05-01 NOTE — Progress Notes (Signed)
Internal Medicine Clinic Resident  I have personally reviewed this encounter including the documentation in this note and/or discussed this patient with the care management provider. I will address any urgent items identified by the care management provider and will communicate my actions to the patient's PCP. I have reviewed the patient's CCM visit with my supervising attending, Dr Vincent.  Denay Pleitez, MD 05/01/2020    

## 2020-05-05 ENCOUNTER — Ambulatory Visit: Payer: Medicare Other

## 2020-05-05 DIAGNOSIS — I82A11 Acute embolism and thrombosis of right axillary vein: Secondary | ICD-10-CM | POA: Diagnosis not present

## 2020-05-05 DIAGNOSIS — I509 Heart failure, unspecified: Secondary | ICD-10-CM | POA: Diagnosis not present

## 2020-05-05 DIAGNOSIS — N189 Chronic kidney disease, unspecified: Secondary | ICD-10-CM | POA: Diagnosis not present

## 2020-05-05 DIAGNOSIS — I5020 Unspecified systolic (congestive) heart failure: Secondary | ICD-10-CM

## 2020-05-05 DIAGNOSIS — M25561 Pain in right knee: Secondary | ICD-10-CM | POA: Diagnosis not present

## 2020-05-05 DIAGNOSIS — E1122 Type 2 diabetes mellitus with diabetic chronic kidney disease: Secondary | ICD-10-CM | POA: Diagnosis not present

## 2020-05-05 DIAGNOSIS — I10 Essential (primary) hypertension: Secondary | ICD-10-CM

## 2020-05-05 DIAGNOSIS — I130 Hypertensive heart and chronic kidney disease with heart failure and stage 1 through stage 4 chronic kidney disease, or unspecified chronic kidney disease: Secondary | ICD-10-CM | POA: Diagnosis not present

## 2020-05-05 DIAGNOSIS — I480 Paroxysmal atrial fibrillation: Secondary | ICD-10-CM

## 2020-05-05 NOTE — Patient Instructions (Signed)
Visit Information  Goals Addressed              This Visit's Progress   .  "We sometimes struggle to pay for utilities and food" (pt-stated)        CARE PLAN ENTRY (see longitudinal plan of care for additional care plan information)  Current Barriers:  . Limited income due to inability to work.  Patient's daughter lives with her and has returned to work but makes significantly less money than job she had prior to [place].  Patient states that they prioritize paying rent and sometimes struggle to afford utilities and food.  Patient does receive food stamps.    Clinical Social Work Clinical Goal(s):  [name] Over the next 30 days, patient will work with SW to address concerns related to financial constraints  Interventions: . Inter-disciplinary care team collaboration (see longitudinal plan of care) . Educated patient about [place] [address] as resource to use, as needed, for financial assistance . Mailed list of local food pantries that can be used as needed. . Discussed Meals on Wheels, however, patient does not meet eligibility criteria.  [name] Collaborated with RNCM, Kelli Churn, regarding patient report that she is unable to afford compression stockings  . Sent message to [facility] Director, Yvonna Alanis, to inquire about access to compression stockings  Patient Self Care Activities:  . Self administers medications as prescribed . Attends all scheduled provider appointments . Calls pharmacy for medication refills . Calls provider office for new concerns or questions . Financial constraints   Initial goal documentation        Patient verbalizes understanding of instructions provided today.   The patient has been provided with contact information for the care management team and has been advised to call with any health related questions or concerns.     [name], [place] Coordination Social Worker [place] [phone]

## 2020-05-05 NOTE — Progress Notes (Signed)
Internal Medicine Clinic Resident  I have personally reviewed this encounter including the documentation in this note and/or discussed this patient with the care management provider. I will address any urgent items identified by the care management provider and will communicate my actions to the patient's PCP. I have reviewed the patient's CCM visit with my supervising attending, Dr Vincent.  Iasha Mccalister, MD 05/05/2020    

## 2020-05-05 NOTE — Chronic Care Management (AMB) (Signed)
  Care Management   Follow Up Note   05/05/2020 Name: [name] MRN: [id number] DOB: 1966-02-01  Referred by: [name], DO Reason for referral : Care Coordination (communiry resources)   [name] is a 54 y.o. year old female who is a primary care patient of [name], [name], DO. The care management team was consulted for assistance with care management and care coordination needs.    Review of patient status, including review of consultants reports, relevant laboratory and other test results, and collaboration with appropriate care team members and the patient's provider was performed as part of comprehensive patient evaluation and provision of chronic care management services.    SDOH (Social Determinants of Health) assessments performed: Yes See Care Plan activities for detailed interventions related to SDOH)  SDOH Interventions     Most Recent Value  SDOH Interventions  Food Insecurity Interventions Other (Comment)  [mail list of local pantries]  Financial Strain Interventions Other (Comment)  [educated pt about [place] 211 to be used as needed for financial assistance]       Advanced Directives: See Care Plan and Vynca application for related entries.   Goals Addressed              This Visit's Progress   .  "We sometimes struggle to pay for utilities and food" (pt-stated)        CARE PLAN ENTRY (see longitudinal plan of care for additional care plan information)  Current Barriers:  . Limited income due to inability to work.  Patient's daughter lives with her and has returned to work but makes significantly less money than job she had prior to [place].  Patient states that they prioritize paying rent and sometimes struggle to afford utilities and food.  Patient does receive food stamps.    Clinical Social Work Clinical Goal(s):  [name] Over the next 30 days, patient will work with SW to address concerns related to financial  constraints  Interventions: . Inter-disciplinary care team collaboration (see longitudinal plan of care) . Educated patient about [place] 211 as resource to use, as needed, for financial assistance . Mailed list of local food pantries that can be used as needed. . Discussed Meals on Wheels, however, patient does not meet eligibility criteria.  [name] Collaborated with RNCM, Kelli Churn, regarding patient report that she is unable to afford compression stockings  . Sent message to [facility] Director, Yvonna Alanis, to inquire about access to compression stockings  Patient Self Care Activities:  . Self administers medications as prescribed . Attends all scheduled provider appointments . Calls pharmacy for medication refills . Calls provider office for new concerns or questions . Financial constraints   Initial goal documentation         The patient has been provided with contact information for the care management team and has been advised to call with any health related questions or concerns.     [name], [place] Coordination Social Worker [place] [phone]

## 2020-05-08 NOTE — Progress Notes (Signed)
Internal Medicine Clinic Attending  CCM services provided by the care management provider and their documentation were discussed with Dr. Marianna Payment. We reviewed the pertinent findings, urgent action items addressed by the resident and non-urgent items to be addressed by the PCP.  I agree with the assessment, diagnosis, and plan of care documented in the CCM and resident's note.  Axel Filler, MD 05/08/2020

## 2020-05-09 ENCOUNTER — Telehealth: Payer: Medicare Other

## 2020-05-09 ENCOUNTER — Telehealth: Payer: Self-pay

## 2020-05-09 NOTE — Telephone Encounter (Signed)
  Chronic Care Management   Outreach Note  05/09/2020 Name: [name] MRN: [id number] DOB: [birth year]  Referred by: [name], DO Reason for referral : Care Coordination (assistance with cost of compression hose)   Attempted to follow up with patient today regarding request for assistance with cost of compression hose.. The patient was referred to the case management team for assistance with care management and care coordination.   Follow Up Plan: A HIPAA compliant phone message was left for the patient providing contact information and requesting a return call.     [name], [place] Coordination Social Worker [place] [phone]

## 2020-05-11 ENCOUNTER — Telehealth: Payer: Self-pay

## 2020-05-11 DIAGNOSIS — E1122 Type 2 diabetes mellitus with diabetic chronic kidney disease: Secondary | ICD-10-CM | POA: Diagnosis not present

## 2020-05-11 DIAGNOSIS — N189 Chronic kidney disease, unspecified: Secondary | ICD-10-CM | POA: Diagnosis not present

## 2020-05-11 DIAGNOSIS — I130 Hypertensive heart and chronic kidney disease with heart failure and stage 1 through stage 4 chronic kidney disease, or unspecified chronic kidney disease: Secondary | ICD-10-CM | POA: Diagnosis not present

## 2020-05-11 DIAGNOSIS — M25561 Pain in right knee: Secondary | ICD-10-CM | POA: Diagnosis not present

## 2020-05-11 DIAGNOSIS — I82A11 Acute embolism and thrombosis of right axillary vein: Secondary | ICD-10-CM | POA: Diagnosis not present

## 2020-05-11 DIAGNOSIS — I509 Heart failure, unspecified: Secondary | ICD-10-CM | POA: Diagnosis not present

## 2020-05-11 NOTE — Telephone Encounter (Signed)
HH PT states at visit today wt is 266.6, denies chest pain, short of breath, increased abd girth. She does have increased swelling in upper R arm. Does not c/o pain or heat in the arm. HHRN will see pt tomorrow, triage ask [facility] PT to have [place] to call triage when she has visit tomorrow. She is agreeable. Will send this notification to dr Aundra Dubin also since she sees him 11/9

## 2020-05-11 NOTE — Telephone Encounter (Signed)
Return call to [facility] - no answer; left message to call the office.

## 2020-05-11 NOTE — Telephone Encounter (Signed)
[name] with [facility] requesting to speak with a nurse about pt is having weight gain. Please call back.

## 2020-05-11 NOTE — Telephone Encounter (Signed)
I believe this is Dr. Clayborne Dana patient.

## 2020-05-11 NOTE — Telephone Encounter (Signed)
So is her weight really up 35 pounds since discharge? If so increase lasix to 80 bid for 3 days and assess response. Is she scheduled to see Kirk Ruths or me?

## 2020-05-12 ENCOUNTER — Ambulatory Visit: Payer: Medicare Other

## 2020-05-12 DIAGNOSIS — I480 Paroxysmal atrial fibrillation: Secondary | ICD-10-CM

## 2020-05-12 DIAGNOSIS — I5020 Unspecified systolic (congestive) heart failure: Secondary | ICD-10-CM

## 2020-05-12 DIAGNOSIS — I10 Essential (primary) hypertension: Secondary | ICD-10-CM

## 2020-05-12 NOTE — Chronic Care Management (AMB) (Signed)
Care Management   Follow Up Note   05/12/2020 Name: [name] MRN: [id number] DOB: 1966-03-13  Referred by: Tracey Heck, DO Reason for referral : Care Coordination (community resources)   [name] is [name] 54 y.o. year old female who is [name] primary care patient of [name], [name], DO. The care management team was consulted for assistance with care management and care coordination needs.    Review of patient status, including review of consultants reports, relevant laboratory and other test results, and collaboration with appropriate care team members and the patient's provider was performed as part of comprehensive patient evaluation and provision of chronic care management services.    SDOH (Social Determinants of Health) assessments performed: No See Care Plan activities for detailed interventions related to [facility])     Advanced Directives: See Care Plan and Vynca application for related entries.   Goals Addressed              This Visit's Progress   .  "I need compression hose but can't afford them" (pt-stated)        CARE PLAN ENTRY (see longitudinal plan of care for additional care plan information)  Current Barriers:  . Unable to afford cost of compression hose  Clinical Social Work Clinical Goal(s):  [name] Over the next 30 days, patient will work with SW to address concerns related to obtaining compression hose  Interventions: . Inter-disciplinary care team collaboration (see longitudinal plan of care) . Informed patient that [name] can provide compression hose but need measurements to ensure correct size . Educated patient on how to obtain measurements . Encouraged patient to seek assistance with this during upcoming appointment at CHF clinic if unable to obtain measurements on her own.  Informed her she could also seek assistance from [name].   [name]   Patient Self Care Activities:  . Self administers medications as prescribed . Attends all scheduled provider  appointments . Calls provider office for new concerns or questions . Financial constraints  Initial goal documentation     .  COMPLETED: "We sometimes struggle to pay for utilities and food" (pt-stated)        CARE PLAN ENTRY (see longitudinal plan of care for additional care plan information)  Current Barriers:  . Limited income due to inability to work.  Patient's daughter lives with her and has returned to work but makes significantly less money than job she had prior to [name].  Patient states that they prioritize paying rent and sometimes struggle to afford utilities and food.  Patient does receive food stamps.    Clinical Social Work Clinical Goal(s):  [name] Over the next 30 days, patient will work with SW to address concerns related to financial constraints  Interventions: . Ensured that patient received resources mailed.   Patient Self Care Activities:  . Self administers medications as prescribed . Attends all scheduled provider appointments . Calls pharmacy for medication refills . Calls provider office for new concerns or questions . Financial constraints   Please see past updates related to this goal by clicking on the "Past Updates" button in the selected goal          The patient has been provided with contact information for the care management team and has been advised to call with any health related questions or concerns.     [name], [place] Coordination Social Worker [place] [phone]

## 2020-05-12 NOTE — Patient Instructions (Signed)
Visit Information  Goals Addressed              This Visit's Progress   .  "I need compression hose but can't afford them" (pt-stated)        CARE PLAN ENTRY (see longitudinal plan of care for additional care plan information)  Current Barriers:  . Unable to afford cost of compression hose  Clinical Social Work Clinical Goal(s):  [name] Over the next 30 days, patient will work with SW to address concerns related to obtaining compression hose  Interventions: . Inter-disciplinary care team collaboration (see longitudinal plan of care) . Informed patient that [facility] can provide compression hose but need measurements to ensure correct size . Educated patient on how to obtain measurements . Encouraged patient to seek assistance with this during upcoming appointment at CHF clinic if unable to obtain measurements on her own.  Informed her she could also seek assistance from [facility] PT.   [name]   Patient Self Care Activities:  . Self administers medications as prescribed . Attends all scheduled provider appointments . Calls provider office for new concerns or questions . Financial constraints  Initial goal documentation     .  COMPLETED: "We sometimes struggle to pay for utilities and food" (pt-stated)        CARE PLAN ENTRY (see longitudinal plan of care for additional care plan information)  Current Barriers:  . Limited income due to inability to work.  Patient's daughter lives with her and has returned to work but makes significantly less money than job she had prior to [place].  Patient states that they prioritize paying rent and sometimes struggle to afford utilities and food.  Patient does receive food stamps.    Clinical Social Work Clinical Goal(s):  [name] Over the next 30 days, patient will work with SW to address concerns related to financial constraints  Interventions: . Ensured that patient received resources mailed.   Patient Self Care Activities:  . Self administers medications as  prescribed . Attends all scheduled provider appointments . Calls pharmacy for medication refills . Calls provider office for new concerns or questions . Financial constraints   Please see past updates related to this goal by clicking on the "Past Updates" button in the selected goal         Patient verbalizes understanding of instructions provided today.   The patient has been provided with contact information for the care management team and has been advised to call with any health related questions or concerns.       [name], [place] Coordination Social Worker [place] [phone]

## 2020-05-12 NOTE — Telephone Encounter (Signed)
Pt is sch to see Dr Aundra Dubin 11/9, but she is Dr Bensimhon's pt so will need to change, attempted to call pt to f/u and Left message to call back

## 2020-05-12 NOTE — Progress Notes (Signed)
Internal Medicine Clinic Resident  I have personally reviewed this encounter including the documentation in this note and/or discussed this patient with the care management provider. I will address any urgent items identified by the care management provider and will communicate my actions to the patient's PCP. I have reviewed the patient's CCM visit with my supervising attending, Dr Hoffman.  Romano Stigger N Hyde [place], DO 05/12/2020   

## 2020-05-12 NOTE — Discharge Instructions (Signed)

## 2020-05-15 ENCOUNTER — Ambulatory Visit: Payer: Medicare Other | Admitting: *Deleted

## 2020-05-15 DIAGNOSIS — I480 Paroxysmal atrial fibrillation: Secondary | ICD-10-CM

## 2020-05-15 DIAGNOSIS — I10 Essential (primary) hypertension: Secondary | ICD-10-CM

## 2020-05-15 DIAGNOSIS — D638 Anemia in other chronic diseases classified elsewhere: Secondary | ICD-10-CM

## 2020-05-15 DIAGNOSIS — I82A11 Acute embolism and thrombosis of right axillary vein: Secondary | ICD-10-CM

## 2020-05-15 DIAGNOSIS — I5020 Unspecified systolic (congestive) heart failure: Secondary | ICD-10-CM

## 2020-05-15 DIAGNOSIS — Z940 Kidney transplant status: Secondary | ICD-10-CM

## 2020-05-15 NOTE — Progress Notes (Signed)
Internal Medicine Clinic Resident  I have personally reviewed this encounter including the documentation in this note and/or discussed this patient with the care management provider. I will address any urgent items identified by the care management provider and will communicate my actions to the patient's PCP. I have reviewed the patient's CCM visit with my supervising attending, Dr Hoffman.  Jya Hughston Y Marialena Wollen, MD 05/15/2020   

## 2020-05-15 NOTE — Chronic Care Management (AMB) (Signed)
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 year old female who is a primary care patient of [name], [name], DO. The CCM team was consulted for assistance with chronic disease management and care coordination needs.    Review of patient status, including review of consultants reports, relevant laboratory and other test results, and collaboration with appropriate care team members and the patient's provider was performed as part of comprehensive patient evaluation and provision of chronic care management services.    SDOH (Social Determinants of Health) assessments performed: No See Care Plan activities for detailed interventions related to [facility])     Outpatient Encounter Medications as of 05/15/2020  Medication Sig Note  . acetaminophen (TYLENOL) 325 MG tablet Take 2 tablets (650 mg total) by mouth every 6 (six) hours as needed for mild pain (or Fever >/= 101).   [name] albuterol (PROVENTIL) (2.5 MG/3ML) 0.083% nebulizer solution Take 3 mLs (2.5 mg total) by nebulization every 6 (six) hours as needed for wheezing or shortness of breath.   [name] apixaban (ELIQUIS) 5 MG TABS tablet Take 2 tablets (10mg ) twice daily for 7 days, then 1 tablet (5mg ) twice daily   . apixaban (ELIQUIS) 5 MG TABS tablet Take 1 tablet (5 mg total) by mouth 2 (two) times daily.   [name] atorvastatin (LIPITOR) 40 MG tablet Take 1 tablet (40 mg total) by mouth daily.   . calcitRIOL (ROCALTROL) 0.5 MCG capsule Take 1 capsule (0.5 mcg total) by mouth daily.   . calcium-vitamin D (OSCAL WITH D) 500-200 MG-UNIT tablet Take 1 tablet by mouth 2 (two) times daily.   . DULoxetine (CYMBALTA) 30 MG capsule Take 1 capsule (30 mg total) by mouth daily.   . ferrous sulfate 325 (65 FE) MG tablet Take 1 tablet (325  mg total) by mouth 2 (two) times daily with a meal.   . furosemide (LASIX) 80 MG tablet Take 1 tablet (80 mg total) by mouth daily.   [name] guaiFENesin-dextromethorphan (ROBITUSSIN DM) 100-10 MG/5ML syrup Take 5 mLs by mouth every 4 (four) hours as needed for cough. (Patient not taking: Reported on 04/19/2020)   . hydrALAZINE (APRESOLINE) 10 MG tablet Take 1 tablet (10 mg total) by mouth every 8 (eight) hours. 04/26/2020: Patient says she has been taking twice daily, she checked the label and verifeid she should be taking three times daily and says she will start taking it as directed  . insulin aspart (NOVOLOG) 100 UNIT/ML injection Inject 0-20 Units into the skin 3 (three) times daily with meals. 04/26/2020: Very rarely requires  . isosorbide mononitrate (IMDUR) 30 MG 24 hr tablet Take 0.5 tablets (15 mg total) by mouth daily.   [name] loratadine (CLARITIN) 10 MG tablet Take 1 tablet (10 mg total) by mouth daily. 04/19/2020: Takes prn  . metoprolol succinate (TOPROL-XL) 25 MG 24 hr tablet Take 1 tablet (25 mg total) by mouth daily.   . mycophenolate (MYFORTIC) 180 MG EC tablet TAKE 3 TABLETS BY MOUTH TWICE DAILY   . pantoprazole (PROTONIX) 40 MG tablet Take 1 tablet (40 mg total) by mouth daily.   . predniSONE (DELTASONE) 2.5 MG tablet Take 1 tablet (2.5 mg total) by mouth daily with breakfast.   . tacrolimus (PROGRAF) 1 MG  capsule Take 3 capsules (3 mg total) by mouth 2 (two) times daily.   [name] triamcinolone ointment (KENALOG) 0.1 % Apply topically 2 (two) times daily as needed. (Patient not taking: Reported on 04/26/2020)   . vitamin B-12 (CYANOCOBALAMIN) 1000 MCG tablet Take 1 tablet (1,000 mcg total) by mouth daily.    No facility-administered encounter medications on file as of 05/15/2020.     Objective:   Goals Addressed              This Visit's Progress     Patient Stated   .  " I don't know anything about an appointment with Dr Aundra Dubin on 05/16/20." (pt-stated)        CARE PLAN ENTRY (see  longitudinal plan of care for additional care plan information)  Current Barriers:  . Care Coordination needs related to new patient appointment with Dr Aundra Dubin in a patient with HF, HTN, CAD, NIDDM, A Fib, hx of DVT- spoek with patient via phone to discuss not in chart related to her appointment on 05/16/20 with cardiologist Dr Aundra Dubin Nurse Case Manager Clinical Goal(s):  [name] Over the next 30 days, patient will attend all scheduled medical appointments: including new patient appointment with Dr Aundra Dubin  Interventions:  . Inter-disciplinary care team collaboration (see longitudinal plan of care) . Collaborated with scheduling staff Adolm Joseph CMA with Dr Claris Gladden office via [employer] regarding patient's lack of knowledge of new patient appointment on 05/16/20. Jasmine states patient will receive new patient package in mail with details about the appointment, address, etc . 05/15/20- Spoke with patient by phone and encouraged her to call the heart failure clinic to clarify regarding her appointment tomorrow since there is documentation int the  chart dated 11/5 stating the office tried to call patient to reschedule the appointment with Dr Haroldine Laws instead of Dr Aundra Dubin  Patient Self Care Activities:  . Patient verbalizes understanding of plan for CCM RN to contact scheduling staff at Dr. Claris Gladden office to notify patient of new patient appointment on 05/16/20 . Patient unaware of new patient appointment on 11/9 with Dr Aundra Dubin  Please see past updates related to this goal by clicking on the "Past Updates" button in the selected goal      .  COMPLETED: " I never received the home nebulizer machine that was ordered when I left the hospital in September." (pt-stated)        [place] (see longitudinal plan of care for additional care plan information)  Current Barriers:  . Care Coordination needs related to needed DME/Supplies in a patient with Atrial Fibrillation, CHF, CAD, HTN, and  DMII . Care Coordination needs related to securing home nebulizer machine in a patient with Atrial Fibrillation, CHF, CAD, HTN, and DMII- spoke with patient and she says the home nebulizer that was ordered on 10/14 has not been delivered. 04/27/20- spoke with patient, she sates a representative form [employer] called her and explained that she could have a nebulizer delivered but she would be accountable for $100 because she does not have a qualifying diagnosis to support the use of a nebulizer. Patient says she told then she did not want it delivered and that her breathing is improving with each day.   Nurse Case Manager Clinical Goal(s):  [name] Over the next 30 days, patient will verbalize understanding of plan to obtain needed DME.  . Over the next  30 days, patient will work with care guide and/or [employer]  to address needs related to  DME   Interventions:  . Inter-disciplinary care team collaboration (see longitudinal plan of care) . Collaboration with/confirmation of receipt of DME orders at [facility]  . Collaborated with clinic provider  regarding order for home nebulizer machine . Messaged Samara Deist with [employer] that order for nebulizer machine was written . Left message for patient advising her [employer] will be contacting  her about delivery of home nebulizer machine . 10/20- messaged [employer] DME representatives via Epic community message that nebulizer has not been delivered . 10/21- returned call to [place] at [phone] and provided ICD 10 codes for acute respiratory failure and multiple pulmonary nodules in addition to heart failure with reduced ejection fraction. Per [place] none of these codes will support need for nebulizer. . 10/21- Messaged clinic provider of need for diagnosis of chronic lung condition to support need for nebulizer . 10/22 left message for patient on her mobile number explaining why nebulizer machine has not been delivered ( need ICD 10 code form provider that  will support need for nebulizer Rx)  . 05/15/20- Spoke with patient, she agrees she no longer needs the nebulizer and does not want to pay $100 for the machine  Patient Self Care Activities:  . Patient verbalizes understanding of plan to work with CCM RN in securing home nebulizer machine . Self administers medications as prescribed . Attends all scheduled provider appointments . Calls pharmacy for medication refills . Performs ADL's independently . Performs IADL's independently . Calls provider office for new concerns or questions . Unable to independently secure home nebulizer machine  Please see past updates related to this goal by clicking on the "Past Updates" button in the selected goal      .  "I need compression hose but can't afford them" (pt-stated)        CARE PLAN ENTRY (see longitudinal plan of care for additional care plan information)  Current Barriers:  . Unable to afford cost of compression hose- 05/15/20- spoke with patient, she says she will have either have the home health nurse or a nurse at the cardiologist office measure her legs for TED hose and then call the clinic with her measurements.   Clinical Social Work Clinical Goal(s):  [name] Over the next 30 days, patient will work with SW to address concerns related to obtaining compression hose  Interventions: . Inter-disciplinary care team collaboration (see longitudinal plan of care) . Informed patient that [facility] can provide compression hose but need measurements to ensure correct size . Educated patient on how to obtain measurements . Encouraged patient to seek assistance with this during upcoming appointment at CHF clinic if unable to obtain measurements on her own.  Informed her she could also seek assistance from [facility] PT.   . 05/15/20 Assessed status of TEd hose  Patient Self Care Activities:  . Self administers medications as prescribed . Attends all scheduled provider appointments . Calls provider office for new  concerns or questions . Financial constraints  Please see past updates related to this goal by clicking on the "Past Updates" button in the selected goal      .  "My home health nurse never brought me a scale and I don't have a home blood pressure monitor." (pt-stated)        [place] (see longitudinal plan of care for additional care plan information)  Current Barriers:  . Care Coordination needs related to needed DME/Supplies in a patient with Atrial Fibrillation, CHF, CAD, HTN, HLD, and DMII .  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  . Over the next 30 days, patient will work with care guide and/or  [place]  to address needs related to DME needs. . Over the next 30 days, patient will work with CCM RN and [facility] staff to address needs related to scales and home blood pressure monitor.  Interventions:  . Inter-disciplinary care team collaboration (see longitudinal plan of care) . Collaboration with/confirmation of receipt of DME orders at  [employer]. Nash Dimmer with [facility] Referral Coordinator Sharmon Revere via [employer] in Epic regarding procuring scales and home blood pressure monitor for patient . 04/27/20- [place] Referral Coordinator at [phone] to discuss patient's DME needs. She states scales are shipped from a Medicare contracted company and it has been taking several weeks for patients to receive their DME. Malachy Mood says [facility] is also able  to provide a home blood pressure monitor for the patient. Malachy Mood will follow up on the DME needs and contact this CCM RN with an update.  . 04/28/20- left message for patient on her mobile number advising her that Sharmon Revere with [facility] will work to expedite securing scales and home BP monitor for patient . Ensured patient is receiving [place] services with weights being monitored . Encouraged patient to call clinic and make acute appointment to address the r arm swelling and left hip pain . Reviewed appointment for retacrit injection on 05/16/20  Patient Self Care Activities:  . Patient verbalizes understanding of plan to work with CCM team and [facility] staff in securing scales and home blood pressure monitor . Self administers medications as prescribed . Attends all scheduled provider appointments . Calls pharmacy for medication refills . Calls provider office for new concerns or questions . Unable to independently secure needed DME  Please see past updates related to this goal by clicking on the "Past Updates" button in the selected goal          Plan:   The care management team will reach out to the patient again over the next 30-60 days.    Kelli Churn RN, CCM, [facility] RN Care Manager [phone]

## 2020-05-16 ENCOUNTER — Inpatient Hospital Stay (HOSPITAL_COMMUNITY)
Admission: RE | Admit: 2020-05-16 | Discharge: 2020-05-16 | Disposition: A | Discharge status: Home / Self Care | Payer: Medicare Other | Source: Ambulatory Visit | Attending: Nephrology | Admitting: Nephrology

## 2020-05-16 ENCOUNTER — Encounter (HOSPITAL_COMMUNITY): Payer: Medicare Other | Admitting: Cardiology

## 2020-05-16 ENCOUNTER — Encounter (HOSPITAL_COMMUNITY): Payer: Self-pay

## 2020-05-16 NOTE — Addendum Note (Signed)
Addended by: Hulan Fray on: 05/16/2020 05:44 PM   Modules accepted: Orders

## 2020-05-17 ENCOUNTER — Ambulatory Visit: Payer: Medicare Other | Admitting: *Deleted

## 2020-05-17 DIAGNOSIS — I480 Paroxysmal atrial fibrillation: Secondary | ICD-10-CM

## 2020-05-17 DIAGNOSIS — I82A11 Acute embolism and thrombosis of right axillary vein: Secondary | ICD-10-CM

## 2020-05-17 DIAGNOSIS — I5020 Unspecified systolic (congestive) heart failure: Secondary | ICD-10-CM

## 2020-05-17 DIAGNOSIS — I10 Essential (primary) hypertension: Secondary | ICD-10-CM

## 2020-05-17 NOTE — Chronic Care Management (AMB) (Signed)
Chronic Care Management   Follow Up Note   05/17/2020 Name: [name] MRN: [id number] DOB: Mar 14, 1966  Referred by: [name], DO Reason for referral : Chronic Care Management (HF, HTN, CAD, NIDDM, A Fib, hx of DVT)   [name] is a 54 y.o. year old female who is a primary care patient of [name], [name], DO. The CCM team was consulted for assistance with chronic disease management and care coordination needs.    Review of patient status, including review of consultants reports, relevant laboratory and other test results, and collaboration with appropriate care team members and the patient's provider was performed as part of comprehensive patient evaluation and provision of chronic care management services.    SDOH (Social Determinants of Health) assessments performed: No See Care Plan activities for detailed interventions related to [facility])     Outpatient Encounter Medications as of 05/17/2020  Medication Sig Note  . acetaminophen (TYLENOL) 325 MG tablet Take 2 tablets (650 mg total) by mouth every 6 (six) hours as needed for mild pain (or Fever >/= 101).   [name] albuterol (PROVENTIL) (2.5 MG/3ML) 0.083% nebulizer solution Take 3 mLs (2.5 mg total) by nebulization every 6 (six) hours as needed for wheezing or shortness of breath. (Patient not taking: Reported on 05/15/2020) 05/15/2020: Patient did not qualify for nebulizer as she has no chronic lung disease that supports the need for  nebulizer treatments  . apixaban (ELIQUIS) 5 MG TABS tablet Take 2 tablets (10mg ) twice daily for 7 days, then 1 tablet (5mg ) twice daily   . apixaban (ELIQUIS) 5 MG TABS tablet Take 1 tablet (5 mg total) by mouth 2 (two) times daily.   [name] atorvastatin (LIPITOR) 40 MG tablet Take 1 tablet (40 mg total) by mouth daily.   . calcitRIOL (ROCALTROL) 0.5 MCG capsule Take 1 capsule (0.5 mcg total) by mouth daily.   . calcium-vitamin D (OSCAL WITH D) 500-200 MG-UNIT tablet Take 1 tablet by mouth 2  (two) times daily.   . DULoxetine (CYMBALTA) 30 MG capsule Take 1 capsule (30 mg total) by mouth daily.   . ferrous sulfate 325 (65 FE) MG tablet Take 1 tablet (325 mg total) by mouth 2 (two) times daily with a meal.   . furosemide (LASIX) 80 MG tablet Take 1 tablet (80 mg total) by mouth daily.   [name] guaiFENesin-dextromethorphan (ROBITUSSIN DM) 100-10 MG/5ML syrup Take 5 mLs by mouth every 4 (four) hours as needed for cough. (Patient not taking: Reported on 04/19/2020)   . hydrALAZINE (APRESOLINE) 10 MG tablet Take 1 tablet (10 mg total) by mouth every 8 (eight) hours. 04/26/2020: Patient says she has been taking twice daily, she checked the label and verifeid she should be taking three times daily and says she will start taking it as directed  . insulin aspart (NOVOLOG) 100 UNIT/ML injection Inject 0-20 Units into the skin 3 (three) times daily with meals. 04/26/2020: Very rarely requires  . isosorbide mononitrate (IMDUR) 30 MG 24 hr tablet Take 0.5 tablets (15 mg total) by mouth daily.   [name] loratadine (CLARITIN) 10 MG tablet Take 1 tablet (10 mg total) by mouth daily. 04/19/2020: Takes prn  . metoprolol succinate (TOPROL-XL) 25 MG 24 hr tablet Take 1 tablet (25 mg total) by mouth daily.   . mycophenolate (MYFORTIC) 180 MG EC tablet TAKE 3 TABLETS BY MOUTH TWICE DAILY   . pantoprazole (PROTONIX) 40 MG tablet Take 1 tablet (40 mg total) by mouth daily.   [name]  predniSONE (DELTASONE) 2.5 MG tablet Take 1 tablet (2.5 mg total) by mouth daily with breakfast.   . tacrolimus (PROGRAF) 1 MG capsule Take 3 capsules (3 mg total) by mouth 2 (two) times daily.   [name] triamcinolone ointment (KENALOG) 0.1 % Apply topically 2 (two) times daily as needed. (Patient not taking: Reported on 04/26/2020)   . vitamin B-12 (CYANOCOBALAMIN) 1000 MCG tablet Take 1 tablet (1,000 mcg total) by mouth daily.    No facility-administered encounter medications on file as of 05/17/2020.      Goals Addressed              This  Visit's Progress     Patient Stated   .  " I don't know anything about an appointment with Dr Aundra Dubin on 05/16/20." (pt-stated)        CARE PLAN ENTRY (see longitudinal plan of care for additional care plan information)  Current Barriers:  . Care Coordination needs related to new patient appointment with Dr Aundra Dubin in a patient with HF, HTN, CAD, NIDDM, A Fib, hx of DVT- left message on patient 's contact number requesting related to her appointment on 05/16/20 with cardiologist Dr Aundra Dubin Nurse Case Manager Clinical Goal(s):  [name] Over the next 30 days, patient will attend all scheduled medical appointments: including new patient appointment with Dr Aundra Dubin  Interventions:  . Inter-disciplinary care team collaboration (see longitudinal plan of care) . Collaborated with scheduling staff Adolm Joseph CMA with Dr Claris Gladden office via [employer] regarding patient's lack of knowledge of new patient appointment on 05/16/20. Jasmine states patient will receive new patient package in mail with details about the appointment, address, etc . 05/15/20- Spoke with patient by phone and encouraged her to call the heart failure clinic to clarify regarding her appointment tomorrow since there is documentation int the  chart dated 11/5 stating the office tried to call patient to reschedule the appointment with Dr Haroldine Laws instead of Dr Aundra Dubin . 05/17/20- left message on patient's mobile number that this CCM RN was calling for update regarding patient's follow up with heart failure clinic. Advised patient this CCM RN will call her the week of 11/15 when this CCM RN returns from vacation  Patient Self Care Activities:  . Patient verbalizes understanding of plan for CCM RN to contact scheduling staff at Dr. Claris Gladden office to notify patient of new patient appointment on 05/16/20 . Patient unaware of new patient appointment on 11/9 with Dr Aundra Dubin  Please see past updates related to this goal by clicking on the "Past  Updates" button in the selected goal          Plan:   The care management team will reach out to the patient again over the next 7-14 days.   Kelli Churn RN, CCM, [facility] RN Care Manager [phone]

## 2020-05-18 NOTE — Progress Notes (Signed)
Internal Medicine Clinic Attending  CCM services provided by the care management provider and their documentation were discussed with Dr. Steen. We reviewed the pertinent findings, urgent action items addressed by the resident and non-urgent items to be addressed by the PCP.  I agree with the assessment, diagnosis, and plan of care documented in the CCM and resident's note.  Raheim Beutler, MD 05/18/2020 

## 2020-05-18 NOTE — Telephone Encounter (Signed)
Have left pt multiple messages since 11/5 and pt no-showed for appt 11/10

## 2020-05-18 NOTE — Progress Notes (Signed)
Internal Medicine Clinic Resident  I have personally reviewed this encounter including the documentation in this note and/or discussed this patient with the care management provider. I will address any urgent items identified by the care management provider and will communicate my actions to the patient's PCP. I have reviewed the patient's CCM visit with my supervising attending, Dr Guilloud.  Jeffrey Geraldina Parrott, MD 05/18/2020    

## 2020-05-22 ENCOUNTER — Telehealth: Payer: Self-pay | Admitting: *Deleted

## 2020-05-22 ENCOUNTER — Telehealth: Payer: Medicare Other

## 2020-05-22 NOTE — Telephone Encounter (Addendum)
  Chronic Care Management   Outreach Note  05/22/2020 Name: [name] MRN: [id number] DOB: 1965/12/30  Referred by: [name], DO Reason for referral : Chronic Care Management (HF, HTN, CAD, NIDDM, A Fib, hx of DVT ( in r  arm) )   A second unsuccessful telephone outreach was attempted today. The purpose of the call was to follow up regarding clarification for patient's initial appointment with the heart failure clinic. The patient was referred to the case management team for assistance with care management and care coordination.   Follow Up Plan: A HIPAA compliant phone message was left for the patient providing contact information and requesting a return call.  The care management team will reach out to the patient again over the next 7-14 days.   Kelli Churn RN, CCM, [facility] RN Care Manager [phone]

## 2020-05-25 ENCOUNTER — Telehealth: Payer: Self-pay

## 2020-05-25 ENCOUNTER — Telehealth: Payer: Self-pay | Admitting: Physician Assistant

## 2020-05-25 ENCOUNTER — Telehealth: Payer: Medicare Other

## 2020-05-25 NOTE — Telephone Encounter (Signed)
Called to discuss the homebound Covid-19 vaccination initiative with the patient and/or caregiver.   Message left to call back.  Stace Peace PA-C  [facility]     

## 2020-05-25 NOTE — Telephone Encounter (Signed)
  Chronic Care Management   Outreach Note  05/25/2020 Name: [name] MRN: [id number] DOB: 12/12/1965  Referred by: [name], DO Reason for referral : Care Coordination   An unsuccessful telephone outreach was attempted today. The patient was referred to the case management team for assistance with care management and care coordination.   Follow Up Plan: A HIPAA compliant phone message was left for the patient providing contact information and requesting a return call.     [name], [place] Coordination Social Worker [place] [phone]

## 2020-05-30 ENCOUNTER — Encounter (HOSPITAL_COMMUNITY): Payer: Medicare Other

## 2020-05-30 DIAGNOSIS — I130 Hypertensive heart and chronic kidney disease with heart failure and stage 1 through stage 4 chronic kidney disease, or unspecified chronic kidney disease: Secondary | ICD-10-CM | POA: Diagnosis not present

## 2020-05-31 ENCOUNTER — Telehealth: Payer: Medicare Other

## 2020-06-07 DIAGNOSIS — I509 Heart failure, unspecified: Secondary | ICD-10-CM

## 2020-06-07 HISTORY — DX: Heart failure, unspecified: I50.9

## 2020-06-13 ENCOUNTER — Ambulatory Visit: Payer: Medicare Other | Admitting: *Deleted

## 2020-06-13 DIAGNOSIS — I82A11 Acute embolism and thrombosis of right axillary vein: Secondary | ICD-10-CM

## 2020-06-13 DIAGNOSIS — I5020 Unspecified systolic (congestive) heart failure: Secondary | ICD-10-CM

## 2020-06-13 DIAGNOSIS — I10 Essential (primary) hypertension: Secondary | ICD-10-CM

## 2020-06-13 DIAGNOSIS — I480 Paroxysmal atrial fibrillation: Secondary | ICD-10-CM

## 2020-06-13 NOTE — Chronic Care Management (AMB) (Signed)
  Chronic Care Management   Outreach Note  06/13/2020 Name: [name] MRN: [id number] DOB: August 30, 1965  Referred by: [name], DO Reason for referral : Chronic Care Management (HF, HTN, CAD, NIDDM, A Fib, hx of DVT ( in r  arm) )   [name] is enrolled in a Managed Medicaid Health Plan: No  Third unsuccessful telephone outreach was attempted today. The patient was referred to the case management team for assistance with care management and care coordination. The patient's primary care provider has been notified of our unsuccessful attempts to make or maintain contact with the patient. The care management team is pleased to engage with this patient at any time in the future should he/she be interested in assistance from the care management team.   Follow Up Plan: No further follow up required: closing referral due to unable to maintain contact with patient  Kelli Churn RN, CCM, [facility] RN Care Manager [phone]

## 2020-06-13 NOTE — Progress Notes (Signed)
Agree with plan, thanks

## 2020-06-29 DIAGNOSIS — I130 Hypertensive heart and chronic kidney disease with heart failure and stage 1 through stage 4 chronic kidney disease, or unspecified chronic kidney disease: Secondary | ICD-10-CM | POA: Diagnosis not present

## 2020-07-04 ENCOUNTER — Inpatient Hospital Stay (HOSPITAL_COMMUNITY)
Admission: EM | Admit: 2020-07-04 | Discharge: 2020-07-20 | DRG: 291 | Disposition: A | Discharge status: Skilled Nursing Facility | Payer: Medicare Other | Attending: Student in an Organized Health Care Education/Training Program | Admitting: Student in an Organized Health Care Education/Training Program

## 2020-07-04 ENCOUNTER — Emergency Department (HOSPITAL_COMMUNITY): Payer: Medicare Other

## 2020-07-04 ENCOUNTER — Other Ambulatory Visit: Payer: Self-pay

## 2020-07-04 DIAGNOSIS — I5082 Biventricular heart failure: Secondary | ICD-10-CM | POA: Diagnosis not present

## 2020-07-04 DIAGNOSIS — N250 Renal osteodystrophy: Secondary | ICD-10-CM | POA: Diagnosis not present

## 2020-07-04 DIAGNOSIS — N2581 Secondary hyperparathyroidism of renal origin: Secondary | ICD-10-CM | POA: Diagnosis present

## 2020-07-04 DIAGNOSIS — I5043 Acute on chronic combined systolic (congestive) and diastolic (congestive) heart failure: Secondary | ICD-10-CM | POA: Diagnosis present

## 2020-07-04 DIAGNOSIS — Z79899 Other long term (current) drug therapy: Secondary | ICD-10-CM

## 2020-07-04 DIAGNOSIS — J9601 Acute respiratory failure with hypoxia: Secondary | ICD-10-CM | POA: Diagnosis not present

## 2020-07-04 DIAGNOSIS — Y929 Unspecified place or not applicable: Secondary | ICD-10-CM

## 2020-07-04 DIAGNOSIS — J9621 Acute and chronic respiratory failure with hypoxia: Secondary | ICD-10-CM | POA: Diagnosis not present

## 2020-07-04 DIAGNOSIS — E1122 Type 2 diabetes mellitus with diabetic chronic kidney disease: Secondary | ICD-10-CM | POA: Diagnosis present

## 2020-07-04 DIAGNOSIS — D638 Anemia in other chronic diseases classified elsewhere: Secondary | ICD-10-CM | POA: Diagnosis not present

## 2020-07-04 DIAGNOSIS — I509 Heart failure, unspecified: Secondary | ICD-10-CM | POA: Diagnosis present

## 2020-07-04 DIAGNOSIS — Z86718 Personal history of other venous thrombosis and embolism: Secondary | ICD-10-CM

## 2020-07-04 DIAGNOSIS — Z794 Long term (current) use of insulin: Secondary | ICD-10-CM

## 2020-07-04 DIAGNOSIS — Z23 Encounter for immunization: Secondary | ICD-10-CM

## 2020-07-04 DIAGNOSIS — R918 Other nonspecific abnormal finding of lung field: Secondary | ICD-10-CM | POA: Diagnosis not present

## 2020-07-04 DIAGNOSIS — I82621 Acute embolism and thrombosis of deep veins of right upper extremity: Secondary | ICD-10-CM | POA: Diagnosis not present

## 2020-07-04 DIAGNOSIS — M6281 Muscle weakness (generalized): Secondary | ICD-10-CM | POA: Diagnosis not present

## 2020-07-04 DIAGNOSIS — J181 Lobar pneumonia, unspecified organism: Secondary | ICD-10-CM | POA: Diagnosis not present

## 2020-07-04 DIAGNOSIS — F17210 Nicotine dependence, cigarettes, uncomplicated: Secondary | ICD-10-CM | POA: Diagnosis present

## 2020-07-04 DIAGNOSIS — L538 Other specified erythematous conditions: Secondary | ICD-10-CM | POA: Diagnosis not present

## 2020-07-04 DIAGNOSIS — Z940 Kidney transplant status: Secondary | ICD-10-CM

## 2020-07-04 DIAGNOSIS — D509 Iron deficiency anemia, unspecified: Secondary | ICD-10-CM | POA: Diagnosis not present

## 2020-07-04 DIAGNOSIS — D649 Anemia, unspecified: Secondary | ICD-10-CM | POA: Diagnosis not present

## 2020-07-04 DIAGNOSIS — A0839 Other viral enteritis: Secondary | ICD-10-CM | POA: Diagnosis not present

## 2020-07-04 DIAGNOSIS — Z7952 Long term (current) use of systemic steroids: Secondary | ICD-10-CM

## 2020-07-04 DIAGNOSIS — R279 Unspecified lack of coordination: Secondary | ICD-10-CM | POA: Diagnosis not present

## 2020-07-04 DIAGNOSIS — N179 Acute kidney failure, unspecified: Secondary | ICD-10-CM | POA: Diagnosis not present

## 2020-07-04 DIAGNOSIS — Z6841 Body Mass Index (BMI) 40.0 and over, adult: Secondary | ICD-10-CM | POA: Diagnosis not present

## 2020-07-04 DIAGNOSIS — Z888 Allergy status to other drugs, medicaments and biological substances status: Secondary | ICD-10-CM

## 2020-07-04 DIAGNOSIS — I132 Hypertensive heart and chronic kidney disease with heart failure and with stage 5 chronic kidney disease, or end stage renal disease: Principal | ICD-10-CM | POA: Diagnosis present

## 2020-07-04 DIAGNOSIS — I10 Essential (primary) hypertension: Secondary | ICD-10-CM | POA: Diagnosis not present

## 2020-07-04 DIAGNOSIS — E872 Acidosis: Secondary | ICD-10-CM | POA: Diagnosis not present

## 2020-07-04 DIAGNOSIS — H9193 Unspecified hearing loss, bilateral: Secondary | ICD-10-CM | POA: Diagnosis present

## 2020-07-04 DIAGNOSIS — E876 Hypokalemia: Secondary | ICD-10-CM | POA: Diagnosis present

## 2020-07-04 DIAGNOSIS — I4729 Other ventricular tachycardia: Secondary | ICD-10-CM

## 2020-07-04 DIAGNOSIS — E785 Hyperlipidemia, unspecified: Secondary | ICD-10-CM | POA: Diagnosis present

## 2020-07-04 DIAGNOSIS — I480 Paroxysmal atrial fibrillation: Secondary | ICD-10-CM

## 2020-07-04 DIAGNOSIS — R404 Transient alteration of awareness: Secondary | ICD-10-CM | POA: Diagnosis not present

## 2020-07-04 DIAGNOSIS — Z833 Family history of diabetes mellitus: Secondary | ICD-10-CM

## 2020-07-04 DIAGNOSIS — A419 Sepsis, unspecified organism: Secondary | ICD-10-CM | POA: Diagnosis not present

## 2020-07-04 DIAGNOSIS — J90 Pleural effusion, not elsewhere classified: Secondary | ICD-10-CM | POA: Diagnosis not present

## 2020-07-04 DIAGNOSIS — H918X3 Other specified hearing loss, bilateral: Secondary | ICD-10-CM | POA: Diagnosis not present

## 2020-07-04 DIAGNOSIS — Z452 Encounter for adjustment and management of vascular access device: Secondary | ICD-10-CM

## 2020-07-04 DIAGNOSIS — R609 Edema, unspecified: Secondary | ICD-10-CM | POA: Diagnosis not present

## 2020-07-04 DIAGNOSIS — I351 Nonrheumatic aortic (valve) insufficiency: Secondary | ICD-10-CM | POA: Diagnosis present

## 2020-07-04 DIAGNOSIS — I252 Old myocardial infarction: Secondary | ICD-10-CM

## 2020-07-04 DIAGNOSIS — R911 Solitary pulmonary nodule: Secondary | ICD-10-CM

## 2020-07-04 DIAGNOSIS — I472 Ventricular tachycardia: Secondary | ICD-10-CM | POA: Diagnosis not present

## 2020-07-04 DIAGNOSIS — R200 Anesthesia of skin: Secondary | ICD-10-CM | POA: Diagnosis not present

## 2020-07-04 DIAGNOSIS — I4819 Other persistent atrial fibrillation: Secondary | ICD-10-CM | POA: Diagnosis present

## 2020-07-04 DIAGNOSIS — M170 Bilateral primary osteoarthritis of knee: Secondary | ICD-10-CM | POA: Diagnosis not present

## 2020-07-04 DIAGNOSIS — D631 Anemia in chronic kidney disease: Secondary | ICD-10-CM | POA: Diagnosis present

## 2020-07-04 DIAGNOSIS — D84821 Immunodeficiency due to drugs: Secondary | ICD-10-CM | POA: Diagnosis not present

## 2020-07-04 DIAGNOSIS — J80 Acute respiratory distress syndrome: Secondary | ICD-10-CM | POA: Diagnosis not present

## 2020-07-04 DIAGNOSIS — I447 Left bundle-branch block, unspecified: Secondary | ICD-10-CM | POA: Diagnosis present

## 2020-07-04 DIAGNOSIS — R1084 Generalized abdominal pain: Secondary | ICD-10-CM | POA: Diagnosis not present

## 2020-07-04 DIAGNOSIS — R0600 Dyspnea, unspecified: Secondary | ICD-10-CM | POA: Diagnosis not present

## 2020-07-04 DIAGNOSIS — Z8249 Family history of ischemic heart disease and other diseases of the circulatory system: Secondary | ICD-10-CM

## 2020-07-04 DIAGNOSIS — R5381 Other malaise: Secondary | ICD-10-CM | POA: Diagnosis not present

## 2020-07-04 DIAGNOSIS — R0689 Other abnormalities of breathing: Secondary | ICD-10-CM | POA: Diagnosis not present

## 2020-07-04 DIAGNOSIS — Z20822 Contact with and (suspected) exposure to covid-19: Secondary | ICD-10-CM | POA: Diagnosis present

## 2020-07-04 DIAGNOSIS — Z9114 Patient's other noncompliance with medication regimen: Secondary | ICD-10-CM

## 2020-07-04 DIAGNOSIS — K219 Gastro-esophageal reflux disease without esophagitis: Secondary | ICD-10-CM | POA: Diagnosis not present

## 2020-07-04 DIAGNOSIS — E119 Type 2 diabetes mellitus without complications: Secondary | ICD-10-CM | POA: Diagnosis not present

## 2020-07-04 DIAGNOSIS — I130 Hypertensive heart and chronic kidney disease with heart failure and stage 1 through stage 4 chronic kidney disease, or unspecified chronic kidney disease: Secondary | ICD-10-CM | POA: Diagnosis not present

## 2020-07-04 DIAGNOSIS — R188 Other ascites: Secondary | ICD-10-CM | POA: Diagnosis not present

## 2020-07-04 DIAGNOSIS — R0602 Shortness of breath: Secondary | ICD-10-CM | POA: Diagnosis not present

## 2020-07-04 DIAGNOSIS — N184 Chronic kidney disease, stage 4 (severe): Secondary | ICD-10-CM | POA: Diagnosis not present

## 2020-07-04 DIAGNOSIS — M7989 Other specified soft tissue disorders: Secondary | ICD-10-CM | POA: Diagnosis not present

## 2020-07-04 DIAGNOSIS — T502X5A Adverse effect of carbonic-anhydrase inhibitors, benzothiadiazides and other diuretics, initial encounter: Secondary | ICD-10-CM | POA: Diagnosis present

## 2020-07-04 DIAGNOSIS — N189 Chronic kidney disease, unspecified: Secondary | ICD-10-CM | POA: Diagnosis not present

## 2020-07-04 DIAGNOSIS — D8489 Other immunodeficiencies: Secondary | ICD-10-CM | POA: Diagnosis not present

## 2020-07-04 DIAGNOSIS — R32 Unspecified urinary incontinence: Secondary | ICD-10-CM | POA: Diagnosis present

## 2020-07-04 DIAGNOSIS — I5023 Acute on chronic systolic (congestive) heart failure: Secondary | ICD-10-CM | POA: Diagnosis not present

## 2020-07-04 DIAGNOSIS — I2510 Atherosclerotic heart disease of native coronary artery without angina pectoris: Secondary | ICD-10-CM | POA: Diagnosis present

## 2020-07-04 DIAGNOSIS — L298 Other pruritus: Secondary | ICD-10-CM | POA: Diagnosis present

## 2020-07-04 DIAGNOSIS — I517 Cardiomegaly: Secondary | ICD-10-CM | POA: Diagnosis not present

## 2020-07-04 DIAGNOSIS — J9600 Acute respiratory failure, unspecified whether with hypoxia or hypercapnia: Secondary | ICD-10-CM | POA: Diagnosis not present

## 2020-07-04 DIAGNOSIS — R197 Diarrhea, unspecified: Secondary | ICD-10-CM | POA: Diagnosis not present

## 2020-07-04 DIAGNOSIS — Z7901 Long term (current) use of anticoagulants: Secondary | ICD-10-CM | POA: Diagnosis not present

## 2020-07-04 DIAGNOSIS — Z8261 Family history of arthritis: Secondary | ICD-10-CM

## 2020-07-04 HISTORY — DX: Heart failure, unspecified: I50.9

## 2020-07-04 LAB — URINALYSIS, ROUTINE W REFLEX MICROSCOPIC
Bilirubin Urine: NEGATIVE
Glucose, UA: NEGATIVE mg/dL
Ketones, ur: NEGATIVE mg/dL
Leukocytes,Ua: NEGATIVE
Nitrite: NEGATIVE
Protein, ur: >=300 mg/dL — AB
Specific Gravity, Urine: 1.012 (ref 1.005–1.030)
pH: 5 (ref 5.0–8.0)

## 2020-07-04 LAB — CBC WITH DIFFERENTIAL/PLATELET
Abs Immature Granulocytes: 0.04 10*3/uL (ref 0.00–0.07)
Basophils Absolute: 0 10*3/uL (ref 0.0–0.1)
Basophils Relative: 0 %
Eosinophils Absolute: 0.1 10*3/uL (ref 0.0–0.5)
Eosinophils Relative: 1 %
HCT: 27.9 % — ABNORMAL LOW (ref 36.0–46.0)
Hemoglobin: 8 g/dL — ABNORMAL LOW (ref 12.0–15.0)
Immature Granulocytes: 1 %
Lymphocytes Relative: 8 %
Lymphs Abs: 0.5 10*3/uL — ABNORMAL LOW (ref 0.7–4.0)
MCH: 28.6 pg (ref 26.0–34.0)
MCHC: 28.7 g/dL — ABNORMAL LOW (ref 30.0–36.0)
MCV: 99.6 fL (ref 80.0–100.0)
Monocytes Absolute: 0.3 10*3/uL (ref 0.1–1.0)
Monocytes Relative: 4 %
Neutro Abs: 5.7 10*3/uL (ref 1.7–7.7)
Neutrophils Relative %: 86 %
Platelets: 198 10*3/uL (ref 150–400)
RBC: 2.8 MIL/uL — ABNORMAL LOW (ref 3.87–5.11)
RDW: 17.7 % — ABNORMAL HIGH (ref 11.5–15.5)
WBC: 6.5 10*3/uL (ref 4.0–10.5)
nRBC: 0 % (ref 0.0–0.2)

## 2020-07-04 LAB — I-STAT ARTERIAL BLOOD GAS, ED
Acid-base deficit: 6 mmol/L — ABNORMAL HIGH (ref 0.0–2.0)
Bicarbonate: 18.5 mmol/L — ABNORMAL LOW (ref 20.0–28.0)
Calcium, Ion: 0.93 mmol/L — ABNORMAL LOW (ref 1.15–1.40)
HCT: 24 % — ABNORMAL LOW (ref 36.0–46.0)
Hemoglobin: 8.2 g/dL — ABNORMAL LOW (ref 12.0–15.0)
O2 Saturation: 65 %
Patient temperature: 97.9
Potassium: 2.9 mmol/L — ABNORMAL LOW (ref 3.5–5.1)
Sodium: 141 mmol/L (ref 135–145)
TCO2: 19 mmol/L — ABNORMAL LOW (ref 22–32)
pCO2 arterial: 30.5 mmHg — ABNORMAL LOW (ref 32.0–48.0)
pH, Arterial: 7.39 (ref 7.350–7.450)
pO2, Arterial: 33 mmHg — CL (ref 83.0–108.0)

## 2020-07-04 LAB — I-STAT CHEM 8, ED
BUN: 34 mg/dL — ABNORMAL HIGH (ref 6–20)
Calcium, Ion: 0.77 mmol/L — CL (ref 1.15–1.40)
Chloride: 111 mmol/L (ref 98–111)
Creatinine, Ser: 2.8 mg/dL — ABNORMAL HIGH (ref 0.44–1.00)
Glucose, Bld: 131 mg/dL — ABNORMAL HIGH (ref 70–99)
HCT: 26 % — ABNORMAL LOW (ref 36.0–46.0)
Hemoglobin: 8.8 g/dL — ABNORMAL LOW (ref 12.0–15.0)
Potassium: 3 mmol/L — ABNORMAL LOW (ref 3.5–5.1)
Sodium: 146 mmol/L — ABNORMAL HIGH (ref 135–145)
TCO2: 21 mmol/L — ABNORMAL LOW (ref 22–32)

## 2020-07-04 LAB — COMPREHENSIVE METABOLIC PANEL
ALT: 18 U/L (ref 0–44)
AST: 24 U/L (ref 15–41)
Albumin: 2.5 g/dL — ABNORMAL LOW (ref 3.5–5.0)
Alkaline Phosphatase: 445 U/L — ABNORMAL HIGH (ref 38–126)
Anion gap: 12 (ref 5–15)
BUN: 33 mg/dL — ABNORMAL HIGH (ref 6–20)
CO2: 20 mmol/L — ABNORMAL LOW (ref 22–32)
Calcium: 5.5 mg/dL — CL (ref 8.9–10.3)
Chloride: 113 mmol/L — ABNORMAL HIGH (ref 98–111)
Creatinine, Ser: 2.94 mg/dL — ABNORMAL HIGH (ref 0.44–1.00)
GFR, Estimated: 18 mL/min — ABNORMAL LOW (ref >60)
Glucose, Bld: 142 mg/dL — ABNORMAL HIGH (ref 70–99)
Potassium: 3 mmol/L — ABNORMAL LOW (ref 3.5–5.1)
Sodium: 145 mmol/L (ref 135–145)
Total Bilirubin: 0.6 mg/dL (ref 0.3–1.2)
Total Protein: 4.9 g/dL — ABNORMAL LOW (ref 6.5–8.1)

## 2020-07-04 LAB — LACTIC ACID, PLASMA
Lactic Acid, Venous: 1.2 mmol/L (ref 0.5–1.9)
Lactic Acid, Venous: 0.8 mmol/L (ref 0.5–1.9)

## 2020-07-04 LAB — RESP PANEL BY RT-PCR (FLU A&B, COVID) ARPGX2
Influenza A by PCR: NEGATIVE
Influenza B by PCR: NEGATIVE
SARS Coronavirus 2 by RT PCR: NEGATIVE

## 2020-07-04 LAB — PROTIME-INR
INR: 1.4 — ABNORMAL HIGH (ref 0.8–1.2)
Prothrombin Time: 16.6 seconds — ABNORMAL HIGH (ref 11.4–15.2)

## 2020-07-04 LAB — APTT: aPTT: 38 seconds — ABNORMAL HIGH (ref 24–36)

## 2020-07-04 LAB — TROPONIN I (HIGH SENSITIVITY)
Troponin I (High Sensitivity): 111 ng/L (ref <18)
Troponin I (High Sensitivity): 118 ng/L (ref <18)

## 2020-07-04 LAB — BRAIN NATRIURETIC PEPTIDE: B Natriuretic Peptide: 2834.2 pg/mL — ABNORMAL HIGH (ref 0.0–100.0)

## 2020-07-04 MED ORDER — PREDNISONE 5 MG PO TABS
2.5000 mg | ORAL_TABLET | Freq: Every day | ORAL | Status: DC
  Administered 2020-07-05 – 2020-07-11 (×7): 2.5 mg via ORAL
  Filled 2020-07-04 (×8): qty 1

## 2020-07-04 MED ORDER — SODIUM CHLORIDE 0.9 % IV SOLN
250.0000 mL | INTRAVENOUS | Status: DC | PRN
  Administered 2020-07-16: 250 mL via INTRAVENOUS

## 2020-07-04 MED ORDER — TACROLIMUS 1 MG PO CAPS
3.0000 mg | ORAL_CAPSULE | Freq: Two times a day (BID) | ORAL | Status: DC
  Administered 2020-07-05 – 2020-07-14 (×20): 3 mg via ORAL
  Filled 2020-07-04 (×22): qty 3

## 2020-07-04 MED ORDER — FUROSEMIDE 10 MG/ML IJ SOLN
80.0000 mg | Freq: Once | INTRAMUSCULAR | Status: AC
  Administered 2020-07-04: 20:00:00 80 mg via INTRAVENOUS
  Filled 2020-07-04: qty 8

## 2020-07-04 MED ORDER — MYCOPHENOLATE SODIUM 180 MG PO TBEC
540.0000 mg | DELAYED_RELEASE_TABLET | Freq: Two times a day (BID) | ORAL | Status: DC
  Administered 2020-07-05 – 2020-07-20 (×32): 540 mg via ORAL
  Filled 2020-07-04 (×34): qty 3

## 2020-07-04 MED ORDER — LEVOFLOXACIN IN D5W 750 MG/150ML IV SOLN: 750.0000 mg | INTRAVENOUS | Status: DC

## 2020-07-04 MED ORDER — FUROSEMIDE 10 MG/ML IJ SOLN
80.0000 mg | Freq: Two times a day (BID) | INTRAMUSCULAR | Status: DC
  Administered 2020-07-05 – 2020-07-06 (×3): 80 mg via INTRAVENOUS
  Filled 2020-07-04 (×3): qty 8

## 2020-07-04 MED ORDER — INSULIN ASPART 100 UNIT/ML ~~LOC~~ SOLN: 0.0000 [IU] | Freq: Three times a day (TID) | SUBCUTANEOUS | Status: DC

## 2020-07-04 MED ORDER — PANTOPRAZOLE SODIUM 40 MG PO TBEC
40.0000 mg | DELAYED_RELEASE_TABLET | Freq: Every day | ORAL | Status: DC
  Administered 2020-07-05 – 2020-07-20 (×16): 40 mg via ORAL
  Filled 2020-07-04 (×16): qty 1

## 2020-07-04 MED ORDER — ONDANSETRON HCL 4 MG/2ML IJ SOLN: 4.0000 mg | Freq: Four times a day (QID) | INTRAMUSCULAR | Status: DC | PRN

## 2020-07-04 MED ORDER — SODIUM CHLORIDE 0.9% FLUSH: 3.0000 mL | INTRAVENOUS | Status: DC | PRN

## 2020-07-04 MED ORDER — CEFTRIAXONE SODIUM 2 G IJ SOLR
2.0000 g | INTRAMUSCULAR | Status: DC
  Administered 2020-07-04: 22:00:00 2 g via INTRAVENOUS
  Filled 2020-07-04: qty 20

## 2020-07-04 MED ORDER — ACETAMINOPHEN 325 MG PO TABS
650.0000 mg | ORAL_TABLET | Freq: Four times a day (QID) | ORAL | Status: DC | PRN
  Administered 2020-07-07 – 2020-07-08 (×2): 650 mg via ORAL
  Filled 2020-07-04 (×2): qty 2

## 2020-07-04 MED ORDER — APIXABAN 5 MG PO TABS
5.0000 mg | ORAL_TABLET | Freq: Two times a day (BID) | ORAL | Status: DC
  Administered 2020-07-05 – 2020-07-07 (×6): 5 mg via ORAL
  Filled 2020-07-04 (×6): qty 1

## 2020-07-04 MED ORDER — SODIUM CHLORIDE 0.9% FLUSH
3.0000 mL | Freq: Two times a day (BID) | INTRAVENOUS | Status: DC
  Administered 2020-07-05 – 2020-07-19 (×20): 3 mL via INTRAVENOUS

## 2020-07-04 MED ORDER — INSULIN ASPART 100 UNIT/ML ~~LOC~~ SOLN: 0.0000 [IU] | Freq: Every day | SUBCUTANEOUS | Status: DC

## 2020-07-04 MED ORDER — AZITHROMYCIN 500 MG IV SOLR
500.0000 mg | INTRAVENOUS | Status: DC
  Administered 2020-07-05: 01:00:00 500 mg via INTRAVENOUS
  Filled 2020-07-04: qty 500

## 2020-07-04 NOTE — H&P (Addendum)
NAME:  [name], MRN:  [id number], DOB:  Jun 09, 1966, LOS: 0 ADMISSION DATE:  07/04/2020, Primary: [name], DO  CHIEF COMPLAINT:  fatigue, dyspnea  Medical Service: Internal Medicine Teaching Service         Attending Physician: Dr. Oda Kilts, MD    First Contact: Dr. Konrad Penta Pager: [phone]  Second Contact: Dr. 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 Patient reports she has been feeling fatigued for about a week. During this time, she reports subjective fevers, productive cough with yellow sputum, non-bloody diarrhea up to four times per day, and nausea. She mentions her symptoms have worsened over the last two days with increased dyspnea, cough, and fatigue. Patient also reports increased leg swelling and intermittent dysuria during this time. She notes she has not taken any medications over the past two days as she has felt too tired. She also has had some mild chest pain associated with coughing. [name] does states she has been able to eat some over the last few days.   Of note, patient was previously hospitalized in September on IMTS service for acute respiratory failure 2/2 acute on chronic systolic heart failure. During that hospitalization, heart failure team consulted. Myoview performed, suggested ischemic cardiomyopathy as etiology. Further investigation with angiography was deferred given patient's history of CKD and lack of high-grade anginal symptoms. Per last HF note in September, patient was to continue with Bidil TID, carvedilol 3.175m  BID, and diuretics for heart failure and amiodarone for atrial fibrillation. Per discharge summary, patient discharged on Bidil TID and lasix for heart failure and metoprolol for atrial fibrillation. Patient has not been seen by heart failure clinic since discharge.  Per chart review, patient was severely tachypnic and in respiratory distress without hypoxia upon arrival of EMS. At that time she was given albuterol nebulizer, steroids, magnesium, and IM epinephrine 0.337m She was subsequently placed on BiPAP [facility]: SeMarty HeckDO  ED COURSE  In ED, patient afebrile with soft blood pressures sating well on BiPAP upon arrival. Initial labs revealed no leukocytosis, elevated BNP, hypokalemia, hypocalcemia, elevated creatinine, and mild anion gap acidosis. Troponin 111>118. ECG without ischemic changes. CXR with pulmonary congestion, possible infection. IMTS consulted for admission for acute on chronic systolic heart failure.  PAST MEDICAL HISTORY   Past Medical History:  Diagnosis Date  . Blood transfusion without reported diagnosis   . 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 GERD (gastroesophageal reflux disease)    medication induced  . Hypertension   . Renal failure    hx of  . 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 Authorizing Provider  acetaminophen (TYLENOL)  325 MG tablet Take 2 tablets (650 mg total) by mouth every 6 (six) hours as needed for mild pain (or Fever >/= 101). 03/24/20   Marianna Payment, MD  albuterol (PROVENTIL) (2.5 MG/3ML) 0.083% nebulizer solution Take 3 mLs (2.5 mg  total) by nebulization every 6 (six) hours as needed for wheezing or shortness of breath. Patient not taking: Reported on 05/15/2020 03/24/20   Marianna Payment, MD  apixaban (ELIQUIS) 5 MG TABS tablet Take 2 tablets (54m) twice daily for 7 days, then 1 tablet (571m twice daily 04/03/20 04/25/20  WiAngelica PouMD  apixaban (ELIQUIS) 5 MG TABS tablet Take 1 tablet (5 mg total) by mouth 2 (two) times daily. 04/26/20 10/01/20  WiAngelica PouMD  atorvastatin (LIPITOR) 40 MG tablet Take 1 tablet (40 mg total) by mouth daily. 03/25/20   CoMarianna PaymentMD  calcitRIOL (ROCALTROL) 0.5 MCG capsule Take 1 capsule (0.5 mcg total) by mouth daily. 03/25/20   CoMarianna PaymentMD  calcium-vitamin D (OSCAL WITH D) 500-200 MG-UNIT tablet Take 1 tablet by mouth 2 (two) times daily. 03/24/20   CoMarianna PaymentMD  DULoxetine (CYMBALTA) 30 MG capsule Take 1 capsule (30 mg total) by mouth daily. 03/25/20   CoMarianna PaymentMD  ferrous sulfate 325 (65 FE) MG tablet Take 1 tablet (325 mg total) by mouth 2 (two) times daily with a meal. 03/24/20   CoMarianna PaymentMD  furosemide (LASIX) 80 MG tablet Take 1 tablet (80 mg total) by mouth daily. 03/24/20 03/24/21  CoMarianna PaymentMD  guaiFENesin-dextromethorphan (ROBITUSSIN DM) 100-10 MG/5ML syrup Take 5 mLs by mouth every 4 (four) hours as needed for cough. Patient not taking: Reported on 04/19/2020 03/24/20   CoMarianna PaymentMD  hydrALAZINE (APRESOLINE) 10 MG tablet Take 1 tablet (10 mg total) by mouth every 8 (eight) hours. 03/24/20   CoMarianna PaymentMD  insulin aspart (NOVOLOG) 100 UNIT/ML injection Inject 0-20 Units into the skin 3 (three) times daily with meals. 03/24/20   CoMarianna PaymentMD  isosorbide mononitrate (IMDUR) 30 MG 24 hr tablet Take 0.5 tablets (15 mg total) by mouth daily. 03/25/20   CoMarianna PaymentMD  loratadine (CLARITIN) 10 MG tablet Take 1 tablet (10 mg total) by mouth daily. 03/25/20   CoMarianna PaymentMD  metoprolol succinate (TOPROL-XL) 25 MG 24 hr tablet Take 1  tablet (25 mg total) by mouth daily. 03/25/20   CoMarianna PaymentMD  mycophenolate (MYFORTIC) 180 MG EC tablet TAKE 3 TABLETS BY MOUTH TWICE DAILY 03/31/20   WiMaudie MercuryMD  pantoprazole (PROTONIX) 40 MG tablet Take 1 tablet (40 mg total) by mouth daily. 03/25/20   CoMarianna PaymentMD  predniSONE (DELTASONE) 2.5 MG tablet Take 1 tablet (2.5 mg total) by mouth daily with breakfast. 03/25/20   CoMarianna PaymentMD  tacrolimus (PROGRAF) 1 MG capsule Take 3 capsules (3 mg total) by mouth 2 (two) times daily. 03/29/20   [name], [name], DO  triamcinolone ointment (KENALOG) 0.1 % Apply topically 2 (two) times daily as needed. Patient not taking: Reported on 04/26/2020 02/24/20   [provider]  vitamin B-12 (CYANOCOBALAMIN) 1000 MCG tablet Take 1 tablet (1,000 mcg total) by mouth daily. 10/11/19   JoLadona HornsMD    ALLERGIES   Allergies as of 07/04/2020 - Review Complete 05/15/2020  Allergen Reaction Noted  . Infed [iron dextran] Other (See Comments) 12/09/2011  . Cefazolin Itching and Nausea And Vomiting 11/30/2015  . Ancef [cefazolin sodium] Other (See Comments) 11/03/2011  . Lisinopril Swelling  11/30/2015  . Other  11/03/2011    SOCIAL HISTORY  [name] lives with her daughter, Benjaman Lobe ([phone]), in [place]. Reports she could complete her ADL's prior to feeling tired one week ago. Reports she previously had quit using cigarettes but has used some since dealing with the anniversary of her father's passing in November. Denies past or present alcohol use or recreational drug use.  FAMILY HISTORY  Her family history includes Arthritis in her father; Atrial fibrillation in her father; Diabetes in her brother and mother; Heart failure in her sister; Hypertension in her brother, father, mother, and sister. There is no history of Colon cancer or Stomach cancer.   REVIEW OF SYSTEMS  ROS per history of present illness.  PHYSICAL EXAMINATION  Blood pressure (!) 73/61, pulse 74,  temperature 97.9 F (36.6 C), temperature source Axillary, resp. rate (!) 21, height _0  (1.803 m), weight 106.6 kg, last menstrual period 11/05/2012, SpO2 100 %.    Filed Weights   07/04/20 1823  Weight: 106.6 kg   GENERAL: Obese, ill-appearing, non-diaphoretic, acyanotic, mild distress HENT: Normocephalic. Atraumatic. Nose normal.  EYES: Vision grossly intact. No scleral icterus bilaterally. CV:  Regular rate, rhythm. Difficult to assess for murmurs given respiratory status. 2+ distal pulses bilaterally. JVD to jawline. PULM: Tachypnea. Increased work of breathing without use of accessory muscles. Coarse rales and wheezing appreciated bilaterally throughout lung fields.  ABD:  Soft, non-tender, non-distended. Normoactive bowel sounds. MSK: 2+ pitting edema to hips bilateral lower extremities. R upper arm circumferentially larger than L upper arm. Extremities moderately cool to touch bilaterally. SKIN: Chronic, dark skin changes bilateral lower extremities. Loose skin in upper extremities. NEURO: Lethargic, awake, responding to questions appropriately. Moving extremities appropriately. PSYCH: Cooperative. Normal mood, affect, speech.  SIGNIFICANT DIAGNOSTIC TESTS  ECG: Normal sinus rhythm with left axis deviation, widened QRS, QT prolongation. Low voltage throughout. Similar to previous studies.  I personally reviewed patient's ECG with my interpretation as above.  CXR: Pulmonary congestion, worse than previous study in September. Hazy opacities bilateral lung bases, cannot rule out infection. No effusion appreciated.   I personally reviewed patient's CXR with my interpretation as above.  LABS   CBC Latest Ref Rng & Units 07/04/2020 07/04/2020 07/04/2020  WBC 4.0 - 10.5 K/uL - - 6.5  Hemoglobin 12.0 - 15.0 g/dL 8.2(L) 8.8(L) 8.0(L)  Hematocrit 36.0 - 46.0 % 24.0(L) 26.0(L) 27.9(L)  Platelets 150 - 400 K/uL - - 198   BMP Latest Ref Rng & Units 07/04/2020 07/04/2020 07/04/2020   Glucose 70 - 99 mg/dL - 131(H) 142(H)  BUN 6 - 20 mg/dL - 34(H) 33(H)  Creatinine 0.44 - 1.00 mg/dL - 2.80(H) 2.94(H)  BUN/Creat Ratio 9 - 23 - - -  Sodium 135 - 145 mmol/L 141 146(H) 145  Potassium 3.5 - 5.1 mmol/L 2.9(L) 3.0(L) 3.0(L)  Chloride 98 - 111 mmol/L - 111 113(H)  CO2 22 - 32 mmol/L - - 20(L)  Calcium 8.9 - 10.3 mg/dL - - 5.5(LL)   *Of note, patient's ABG listed under "labs" is VBG. Confirmed with Glena Norfolk, RRT, who performed VBG.  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  PLAN  Active Problems:   Acute on chronic heart failure ([place])  #Acute on  chronic systolic heart failure  #Acute respiratory distress without hypoxia Patient presenting with acute respiratory distress in setting of one week history of fatigue, cough, increased leg swelling, diarrhea, and dysuria in setting of medication non-compliance over the last two days. Recent TTE in September revealed EF 25-30%, LV dilation, mildly elevated pulmonary artery pressure, LA dilation, RA dilation, mild-moderate regurgitation of all four cardiac valves. On exam in ED patient is markedly hypervolemic with rales bilaterally throughout lungs and 2+ pitting edema to hips bilaterally. She is now sating well on 4L supplemental O2 after being weaned off BiPAP. BNP elevated to 2800. Troponin mildly elevated 111>118 in setting of CKD, no lactic acidosis. Unclear on if patient had precipitating event, possibly an infection given her productive cough, diarrhea, and dysuria. Her blood pressures have been soft since arrival. Per chart review, patient usually more hypertensive. Do not believe patient is in cardiogenic shock, but has low-output heart  failure. Previously seen by heart failure team during admission in September. Likely heart failure is ischemic, although deferred angiography due to lack of anginal symptoms and patient's CKD. Will begin aggressive diuresis and plan to contact heart failure team in AM for further evaluation. Of note, patient's dry weight is 230. Number listed as weight on arrival is incorrect, can hopefully re-weight tomorrow AM. - Given IV lasix 56m in ED - Start IV lasix 835mBID - Hold home metoprolol, lasix - F/u CBC, BMP, Mg, phos in AM - Daily weights - Strict I&O's - Tele  #Productive cough #Dysuria #Diarrhea Patient reports roughly one week's worth of productive cough with yellow sputum, non-bloody diarrhea up to four times per day, and dysuria. During this time also mentions some subjective fevers. On arrival, patient is afebrile with soft blood pressures. No leukocytosis or lactic acidosis. However, patient is on chronic immunosuppression and infection could not be ruled out on chest x-ray. Patient's presentation could have infectious etiology which started heart failure exacerbation. Cultures drawn in ED, will follow-up and continue empiric antibiotics. - F/u BCx, UCx, UA, GI panel - C/w ceftriaxone, azithromycin  #AKI on CKD IV  #s/p renal transplant on chronic immunosuppression [facility] visit [facility]/20. At that time patient's baseline creatinine believed to be 1.7. During that visit sCr 2.49, believed to be 2/2 cardiorenal syndrome. On arrival, sCr 2.94 w/ GFR 18. Most likely worsening of cardiorenal syndrome vs pre-renal etiology 2/2 GI losses. Plan to diurese patient, will continue to monitor renal function.  In addition, will re-check tacrolimus level while hospitalized. Can consider nephrology consult.  - F/u UA, UCx, tacrolimus level, phos - C/w mycophenolate, tacrolimus, prednisone - Strict I&O's - Avoid nephrotoxic  medications  #Hypokalemia K 3.0 on arrival (3.7 previously 3 months ago). Unclear etiology, as patient has not had lasix in the past few days or other obvious enticing event. Will replete and re-check Mg, BMP in AM. - Replete K - F/u Mg, BMP in AM  #Hypocalcemia #Elevated alkaline phosphatase #Renal osteodystrophy d/t secondary hyperparathyroidism During previous admission in September, found to have elevated PTH (857), low calcium, and elevated alkaline phosphatase with bony lesions on previous imaging. SPEP, light chains unremarkable for multiple myeloma. She was discharged with calcitriol and calcium-vitamin D supplement. Corrected Ca 6.7 on arrival (8.5 in September). Unknown if patient taking supplements as prescribed. Will give one time dose 2g calcium gluconate tonight and re-start home medications. - IV calcium gluconate 2g x1 tonight - Start home calcitriol, calcium-vitamin D supplement -  F/u RFP  CHRONIC: #Hypertension Patient with soft blood pressures on arrival, which is unusual for this patient's typical presentation. Will hold all home antihypertensives at this time. - Hold home Imdur, hydralazine, metoprolol  #Type II diabetes mellitus A1c previously 5.0 in September. Glucose 142 on arrival. Will re-check A1c while inpatient and start on sliding scale. Previously not on insulin but was re-started upon discharge. Will continue to monitor - CBG monitoring - F/u A!c  #Paroxysmal atrial fibrillation CHA2DS2-VASC 4. Patient has history of atrial fibrillation rate controlled with metoprolol. Given her blood pressures, will hold off re-starting her home metoprolol. Can consider amiodarone for rate control if she converts to Afib. Currently hemodynamically stable in normal sinus rhythm. Continue with home eliquis. 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 During last admission, found to be  ATIII deficient. Will continue with eliquis at this time and continue to monitor. - C/w home eliquis  #GERD Patient well-controlled with pantoprazole.  - C/w home pantoprazole  #Normocytic anemia Appears chronic, most likely due to kidney disease. Hgb 8.0 (previously 8.8). No obvious signs of bleeding on exam. Continue to monitor CBC. - Trend CBC  BEST PRACTICE  DIET: NPO IVF: n/a DVT PPX: home Eliquis CODE: FULL FAM COM: Patient mentions to call her daughter, Benjaman Lobe (listed under contact information) in the case of serious or adverse event.  DISPO: Admit patient to Inpatient with expected length of stay greater than 2 midnights.  Sanjuan Dame, MD Internal Medicine Resident PGY-1 PAGER: [phone] 07/04/2020 9:15 PM  If after hours (below), please contact on-call pager: [phone] 5PM-7AM Monday-Friday 1PM-7AM Saturday-Sunday

## 2020-07-04 NOTE — ED Provider Notes (Signed)
[facility] EMERGENCY DEPARTMENT Provider Note   CSN: [id number] Arrival date & time: 07/04/20  1746     History Chief Complaint  Patient presents with  . Respiratory Distress    [name] is a 54 y.o. female.  Patient is a 54 year old female who presents with respiratory distress.  She has a history of diabetes, paroxysmal atrial fibrillation, CHF, hypertension, DVT of her right axillary vein on Eliquis, kidney transplant on immunosuppressant medications.  She reports that over the last week she has had some mild URI symptoms with runny nose and coughing which is somewhat productive.  She has had some intermittent fevers.  She has had some loose stools but no vomiting.  She has received 1 Pfizer shot for [employer].  She reports that over the last couple days she has had some worsening shortness of breath and edema.  She has had some increased leg swelling and swelling of her arms.  She has some chest pain which is mostly from coughing.  When EMS arrived, she was in respiratory distress with severe tachypnea.  She was hunched over and had shallow breathing.  She was given albuterol nebulizer treatment along with Solu-Medrol and magnesium.  She was given 0.3 mg of epinephrine IM for her respiratory distress.  She was placed on BiPAP and has improved on the BiPAP.  She was not noted to be hypoxic by EMS but just having a severe respiratory distress.        Past Medical History:  Diagnosis Date  . Blood transfusion without reported diagnosis   . 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 GERD (gastroesophageal reflux disease)    medication induced  . Hypertension   . Renal failure    hx of  . 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 Elevated alkaline phosphatase level 04/04/2020  . DVT of right axillary vein, acute ([place]) 04/03/2020  . Iliopsoas bursitis of right hip 04/03/2020  . Paroxysmal atrial fibrillation ([place]) 03/22/2020  . Chronic kidney disease, stage 3b ([place]) 03/22/2020  . Anemia of chronic disease 03/20/2020  . Heart failure with reduced ejection fraction ([place]) 03/19/2020  . Multiple pulmonary nodules 11/14/2019  . Vitamin B 12 deficiency 10/11/2019  . Myalgia 10/11/2019  . Leg weakness, bilateral 08/19/2019  . GERD (gastroesophageal reflux disease) 01/27/2019  . Secondary hyperparathyroidism of renal origin ([place]) 06/11/2018  . Encounter for monitoring tacrolimus therapy 05/22/2018  . Hyperlipidemia 02/12/2017  . Preventative health care 08/12/2012  . Tobacco abuse 04/30/2012  . S/p cadaver renal transplant 11/19/2011  . Type II diabetes mellitus, well controlled ([place]) 11/04/2011  . HTN (hypertension) 11/03/2011    Past Surgical History:  Procedure Laterality Date  . [place]  . CHOLECYSTECTOMY  2008  . COLONOSCOPY  10 years ago   Dr.Hung="normal"  . DG AV DIALYSIS  SHUNT ACCESS EXIST*L* OR    . KIDNEY TRANSPLANT  2011  . UMBILICAL HERNIA REPAIR  2009     OB History   No obstetric history on file.     Family History  Problem  Relation Age of Onset  . Hypertension Mother   . Diabetes Mother   . Atrial fibrillation Father   . Hypertension Father   . Arthritis Father   . Heart failure Sister   . Hypertension Sister        all 4 sisters  . Hypertension Brother        both brothers  . Diabetes Brother   . Colon cancer Neg Hx   . Stomach cancer Neg Hx     Social History   Tobacco Use  . Smoking status: Current Some Day Smoker    Packs/day: 0.50    Types: Cigarettes    Last attempt to quit: 11/05/2017    Years  since quitting: 2.6  . Smokeless tobacco: Never Used  . Tobacco comment: 10 some days   Vaping Use  . Vaping Use: Never used  Substance Use Topics  . Alcohol use: Never    Alcohol/week: 0.0 standard drinks  . Drug use: Never    Home Medications Prior to Admission medications   Medication Sig Start Date End Date Taking? Authorizing Provider  acetaminophen (TYLENOL) 325 MG tablet Take 2 tablets (650 mg total) by mouth every 6 (six) hours as needed for mild pain (or Fever >/= 101). 03/24/20   Marianna Payment, MD  albuterol (PROVENTIL) (2.5 MG/3ML) 0.083% nebulizer solution Take 3 mLs (2.5 mg total) by nebulization every 6 (six) hours as needed for wheezing or shortness of breath. Patient not taking: Reported on 05/15/2020 03/24/20   Marianna Payment, MD  apixaban (ELIQUIS) 5 MG TABS tablet Take 2 tablets (10mg ) twice daily for 7 days, then 1 tablet (5mg ) twice daily 04/03/20 04/25/20  Angelica Pou, MD  apixaban (ELIQUIS) 5 MG TABS tablet Take 1 tablet (5 mg total) by mouth 2 (two) times daily. 04/26/20 10/01/20  Angelica Pou, MD  atorvastatin (LIPITOR) 40 MG tablet Take 1 tablet (40 mg total) by mouth daily. 03/25/20   Marianna Payment, MD  calcitRIOL (ROCALTROL) 0.5 MCG capsule Take 1 capsule (0.5 mcg total) by mouth daily. 03/25/20   Marianna Payment, MD  calcium-vitamin D (OSCAL WITH D) 500-200 MG-UNIT tablet Take 1 tablet by mouth 2 (two) times daily. 03/24/20   Marianna Payment, MD  DULoxetine (CYMBALTA) 30 MG capsule Take 1 capsule (30 mg total) by mouth daily. 03/25/20   Marianna Payment, MD  ferrous sulfate 325 (65 FE) MG tablet Take 1 tablet (325 mg total) by mouth 2 (two) times daily with a meal. 03/24/20   Marianna Payment, MD  furosemide (LASIX) 80 MG tablet Take 1 tablet (80 mg total) by mouth daily. 03/24/20 03/24/21  Marianna Payment, MD  guaiFENesin-dextromethorphan (ROBITUSSIN DM) 100-10 MG/5ML syrup Take 5 mLs by mouth every 4 (four) hours as needed for cough. Patient not taking: Reported on  04/19/2020 03/24/20   Marianna Payment, MD  hydrALAZINE (APRESOLINE) 10 MG tablet Take 1 tablet (10 mg total) by mouth every 8 (eight) hours. 03/24/20   Marianna Payment, MD  insulin aspart (NOVOLOG) 100 UNIT/ML injection Inject 0-20 Units into the skin 3 (three) times daily with meals. 03/24/20   Marianna Payment, MD  isosorbide mononitrate (IMDUR) 30 MG 24 hr tablet Take 0.5 tablets (15 mg total) by mouth daily. 03/25/20   Marianna Payment, MD  loratadine (CLARITIN) 10 MG tablet Take 1 tablet (10 mg total) by mouth daily. 03/25/20   Marianna Payment, MD  metoprolol succinate (TOPROL-XL) 25 MG 24 hr tablet Take 1 tablet (25 mg total) by mouth daily. 03/25/20  Marianna Payment, MD  mycophenolate (MYFORTIC) 180 MG EC tablet TAKE 3 TABLETS BY MOUTH TWICE DAILY 03/31/20   Maudie Mercury, MD  pantoprazole (PROTONIX) 40 MG tablet Take 1 tablet (40 mg total) by mouth daily. 03/25/20   Marianna Payment, MD  predniSONE (DELTASONE) 2.5 MG tablet Take 1 tablet (2.5 mg total) by mouth daily with breakfast. 03/25/20   Marianna Payment, MD  tacrolimus (PROGRAF) 1 MG capsule Take 3 capsules (3 mg total) by mouth 2 (two) times daily. 03/29/20   [name], [name], DO  triamcinolone ointment (KENALOG) 0.1 % Apply topically 2 (two) times daily as needed. Patient not taking: Reported on 04/26/2020 02/24/20   [provider]  vitamin B-12 (CYANOCOBALAMIN) 1000 MCG tablet Take 1 tablet (1,000 mcg total) by mouth daily. 10/11/19   Ladona Horns, MD    Allergies    Ardyth Harps Antonieta Pert dextran], Cefazolin, Ancef [cefazolin sodium], Lisinopril, and Other  Review of Systems   Review of Systems  Constitutional: Positive for fatigue and fever. Negative for chills and diaphoresis.  HENT: Positive for congestion and rhinorrhea. Negative for sneezing.   Eyes: Negative.   Respiratory: Positive for cough and shortness of breath. Negative for chest tightness.   Cardiovascular: Positive for chest pain and leg swelling.  Gastrointestinal: Positive for diarrhea.  Negative for abdominal pain, blood in stool, nausea and vomiting.  Genitourinary: Negative for difficulty urinating, flank pain, frequency and hematuria.  Musculoskeletal: Negative for arthralgias and back pain.  Skin: Negative for rash.  Neurological: Negative for dizziness, speech difficulty, weakness, numbness and headaches.    Physical Exam Updated Vital Signs BP (!) 73/61   Pulse 74   Temp 97.9 F (36.6 C) (Axillary)   Resp (!) 21   Ht 5\' 11"  (1.803 m)   Wt 106.6 kg   LMP 11/05/2012   SpO2 100%   BMI 32.78 kg/m   Physical Exam Constitutional:      General: She is in acute distress.     Appearance: She is well-developed and well-nourished.  HENT:     Head: Normocephalic and atraumatic.  Eyes:     Pupils: Pupils are equal, round, and reactive to light.  Cardiovascular:     Rate and Rhythm: Normal rate and regular rhythm.     Heart sounds: Normal heart sounds.  Pulmonary:     Effort: Respiratory distress present.     Breath sounds: Wheezing and rales present.  Chest:     Chest wall: No tenderness.  Abdominal:     General: Bowel sounds are normal.     Palpations: Abdomen is soft.     Tenderness: There is no abdominal tenderness. There is no guarding or rebound.  Musculoskeletal:        General: Normal range of motion.     Cervical back: Normal range of motion and neck supple.     Right lower leg: Edema present.     Left lower leg: Edema present.     Comments: 2+ pitting edema to lower extremities bilaterally  Lymphadenopathy:     Cervical: No cervical adenopathy.  Skin:    General: Skin is warm and dry.     Findings: No rash.  Neurological:     Mental Status: She is alert and oriented to person, place, and time.  Psychiatric:        Mood and Affect: Mood and affect normal.     ED Results / Procedures / Treatments   Labs (all labs ordered are listed, but only abnormal results  are displayed) Labs Reviewed  COMPREHENSIVE METABOLIC PANEL - Abnormal; Notable  for the following components:      Result Value   Potassium 3.0 (*)    Chloride 113 (*)    CO2 20 (*)    Glucose, Bld 142 (*)    BUN 33 (*)    Creatinine, Ser 2.94 (*)    Calcium 5.5 (*)    Total Protein 4.9 (*)    Albumin 2.5 (*)    Alkaline Phosphatase 445 (*)    GFR, Estimated 18 (*)    All other components within normal limits  CBC WITH DIFFERENTIAL/PLATELET - Abnormal; Notable for the following components:   RBC 2.80 (*)    Hemoglobin 8.0 (*)    HCT 27.9 (*)    MCHC 28.7 (*)    RDW 17.7 (*)    Lymphs Abs 0.5 (*)    All other components within normal limits  PROTIME-INR - Abnormal; Notable for the following components:   Prothrombin Time 16.6 (*)    INR 1.4 (*)    All other components within normal limits  APTT - Abnormal; Notable for the following components:   aPTT 38 (*)    All other components within normal limits  BRAIN NATRIURETIC PEPTIDE - Abnormal; Notable for the following components:   B Natriuretic Peptide 2,834.2 (*)    All other components within normal limits  I-STAT CHEM 8, ED - Abnormal; Notable for the following components:   Sodium 146 (*)    Potassium 3.0 (*)    BUN 34 (*)    Creatinine, Ser 2.80 (*)    Glucose, Bld 131 (*)    Calcium, Ion 0.77 (*)    TCO2 21 (*)    Hemoglobin 8.8 (*)    HCT 26.0 (*)    All other components within normal limits  I-STAT ARTERIAL BLOOD GAS, ED - Abnormal; Notable for the following components:   pCO2 arterial 30.5 (*)    pO2, Arterial 33 (*)    Bicarbonate 18.5 (*)    TCO2 19 (*)    Acid-base deficit 6.0 (*)    Potassium 2.9 (*)    Calcium, Ion 0.93 (*)    HCT 24.0 (*)    Hemoglobin 8.2 (*)    All other components within normal limits  TROPONIN I (HIGH SENSITIVITY) - Abnormal; Notable for the following components:   Troponin I (High Sensitivity) 111 (*)    All other components within normal limits  RESP PANEL BY RT-PCR (FLU A&B, COVID) ARPGX2  CULTURE, BLOOD (SINGLE)  URINE CULTURE  LACTIC ACID, PLASMA   LACTIC ACID, PLASMA  URINALYSIS, ROUTINE W REFLEX MICROSCOPIC  BLOOD GAS, ARTERIAL  TROPONIN I (HIGH SENSITIVITY)    EKG EKG Interpretation  Date/Time:  Tuesday July 04 2020 17:57:05 EST Ventricular Rate:  72 PR Interval:    QRS Duration: 159 QT Interval:  453 QTC Calculation: 496 R Axis:   -65 Text Interpretation: Atrial fibrillation Ventricular premature complex Nonspecific IVCD with LAD Left ventricular hypertrophy Probable lateral infarct, age indeterminate Anterior Q waves, possibly due to LVH Confirmed by Malvin Johns [phone]) on 07/04/2020 6:22:25 PM   Radiology DG Chest Port 1 View  Result Date: 07/04/2020 CLINICAL DATA:  Short of breath, sepsis EXAM: PORTABLE CHEST 1 VIEW COMPARISON:  03/16/2020 FINDINGS: Single frontal view of the chest demonstrates an enlarged cardiac silhouette. There is central vascular congestion, with increased interstitial prominence, bibasilar consolidation, and bilateral effusions. Changes are more pronounced on the left. No pneumothorax. IMPRESSION:  1. Findings most consistent with congestive heart failure. Superimposed infection would be difficult to exclude. Electronically Signed   By: Randa Ngo M.D.   On: 07/04/2020 18:38    Procedures Procedures (including critical care time)  Medications Ordered in ED Medications  cefTRIAXone (ROCEPHIN) 2 g in sodium chloride 0.9 % 100 mL IVPB (has no administration in time range)  azithromycin (ZITHROMAX) 500 mg in sodium chloride 0.9 % 250 mL IVPB (has no administration in time range)  furosemide (LASIX) injection 80 mg (80 mg Intravenous Given 07/04/20 1935)    ED Course  I have reviewed the triage vital signs and the nursing notes.  Pertinent labs & imaging results that were available during my care of the patient were reviewed by me and considered in my medical decision making (see chart for details).    MDM Rules/Calculators/A&P                          Patient presents in  respiratory distress. She was placed on BiPAP and did improve after the BiPAP. She is maintaining normal oxygen saturations on the BiPAP. Her chest x-ray is consistent with fluid overload/pulmonary edema. Her BNP is markedly elevated at 2800. Her creatinine is elevated but similar to prior values. Her WBC count is normal. She is anemic but her hemoglobin is similar to prior values. Her lactate is normal. Her blood pressure is soft in the 80s and 90s. Currently is 96/74 on my reexam. I feel like her respiratory distress is mostly from pulmonary edema although she reports fever and productive cough over the last week so I will go ahead and treat her with antibiotics for potential pneumonia as well. Her Covid test is negative. Her troponin was mildly elevated. Repeat is pending. She does not have ischemic changes on EKG. Her chest pain seems to be more associated with coughing and does not sound is suspicious for ACS. She has some mild hypokalemia. I will consult internal medicine teaching service who is her PCP for further treatment/admission.  CRITICAL CARE Performed by: Malvin Johns Total critical care time: 45 minutes Critical care time was exclusive of separately billable procedures and treating other patients. Critical care was necessary to treat or prevent imminent or life-threatening deterioration. Critical care was time spent personally by me on the following activities: development of treatment plan with patient and/or surrogate as well as nursing, discussions with consultants, evaluation of patient's response to treatment, examination of patient, obtaining history from patient or surrogate, ordering and performing treatments and interventions, ordering and review of laboratory studies, ordering and review of radiographic studies, pulse oximetry and re-evaluation of patient's condition.  Final Clinical Impression(s) / ED Diagnoses Final diagnoses:  Acute on chronic congestive heart failure,  unspecified heart failure type ([place])  Acute on chronic respiratory failure with hypoxia [facility])    Rx / DC Orders ED Discharge Orders    None       Malvin Johns, MD 07/04/20 2118

## 2020-07-04 NOTE — Progress Notes (Signed)
Patient trialed off BIPAP at this time. Patient tolerating 4L [place] well at this time.

## 2020-07-04 NOTE — ED Triage Notes (Addendum)
Pt arrives to ED BIB [facility] for Respiratory Distress. Per EMS initial call was for edema but upon EMS arrival it  Was noted that pt was hunched over and having a difficult time breathing. Per EMS pt stated being [facility] x2 days. EMS placed pt on CPAP. 5mg  Albuterol, .5 Atrovent. 2gram MagSulfate and 125 Solu-Medrol was administered by EMS in route. Edema is noted on all extremities including abdomen. Extensive Hx including CHF, Kidney Failure/Kidney Transplant, Diabetes in Remission etc.. RT at bedside upon pt's arrival and placed pt on Bi-PAP, pt is tolerating it well sating at 100%. Pt is a/o x4.  T 100.1 BP 148/82 HR 100 R 60 O2 100% CPAP

## 2020-07-05 ENCOUNTER — Encounter (HOSPITAL_COMMUNITY): Payer: Self-pay | Admitting: Internal Medicine

## 2020-07-05 ENCOUNTER — Inpatient Hospital Stay (HOSPITAL_COMMUNITY): Payer: Medicare Other

## 2020-07-05 DIAGNOSIS — I5023 Acute on chronic systolic (congestive) heart failure: Secondary | ICD-10-CM

## 2020-07-05 DIAGNOSIS — I2510 Atherosclerotic heart disease of native coronary artery without angina pectoris: Secondary | ICD-10-CM | POA: Diagnosis not present

## 2020-07-05 DIAGNOSIS — I480 Paroxysmal atrial fibrillation: Secondary | ICD-10-CM

## 2020-07-05 DIAGNOSIS — N184 Chronic kidney disease, stage 4 (severe): Secondary | ICD-10-CM | POA: Diagnosis not present

## 2020-07-05 LAB — ECHOCARDIOGRAM COMPLETE
Area-P 1/2: 3.77 cm2
Height: 71 in
P 1/2 time: 502 msec
S' Lateral: 5.2 cm
Weight: 3760.17 oz

## 2020-07-05 LAB — BLOOD CULTURE ID PANEL (REFLEXED) - BCID2

## 2020-07-05 LAB — HEMOGLOBIN A1C
Hgb A1c MFr Bld: 5.6 % (ref 4.8–5.6)
Mean Plasma Glucose: 114.02 mg/dL

## 2020-07-05 LAB — RENAL FUNCTION PANEL
Albumin: 2.4 g/dL — ABNORMAL LOW (ref 3.5–5.0)
Anion gap: 12 (ref 5–15)
BUN: 34 mg/dL — ABNORMAL HIGH (ref 6–20)
CO2: 20 mmol/L — ABNORMAL LOW (ref 22–32)
Calcium: 5.8 mg/dL — CL (ref 8.9–10.3)
Chloride: 113 mmol/L — ABNORMAL HIGH (ref 98–111)
Creatinine, Ser: 2.81 mg/dL — ABNORMAL HIGH (ref 0.44–1.00)
GFR, Estimated: 19 mL/min — ABNORMAL LOW (ref >60)
Glucose, Bld: 137 mg/dL — ABNORMAL HIGH (ref 70–99)
Phosphorus: 5.3 mg/dL — ABNORMAL HIGH (ref 2.5–4.6)
Potassium: 3.4 mmol/L — ABNORMAL LOW (ref 3.5–5.1)
Sodium: 145 mmol/L (ref 135–145)

## 2020-07-05 LAB — CBC WITH DIFFERENTIAL/PLATELET
Abs Immature Granulocytes: 0.07 10*3/uL (ref 0.00–0.07)
Basophils Absolute: 0 10*3/uL (ref 0.0–0.1)
Basophils Relative: 0 %
Eosinophils Absolute: 0 10*3/uL (ref 0.0–0.5)
Eosinophils Relative: 0 %
HCT: 28.6 % — ABNORMAL LOW (ref 36.0–46.0)
Hemoglobin: 8.6 g/dL — ABNORMAL LOW (ref 12.0–15.0)
Immature Granulocytes: 1 %
Lymphocytes Relative: 5 %
Lymphs Abs: 0.3 10*3/uL — ABNORMAL LOW (ref 0.7–4.0)
MCH: 29.6 pg (ref 26.0–34.0)
MCHC: 30.1 g/dL (ref 30.0–36.0)
MCV: 98.3 fL (ref 80.0–100.0)
Monocytes Absolute: 0.1 10*3/uL (ref 0.1–1.0)
Monocytes Relative: 2 %
Neutro Abs: 6.7 10*3/uL (ref 1.7–7.7)
Neutrophils Relative %: 92 %
Platelets: 220 10*3/uL (ref 150–400)
RBC: 2.91 MIL/uL — ABNORMAL LOW (ref 3.87–5.11)
RDW: 17.8 % — ABNORMAL HIGH (ref 11.5–15.5)
WBC: 7.3 10*3/uL (ref 4.0–10.5)
nRBC: 0 % (ref 0.0–0.2)

## 2020-07-05 LAB — TSH: TSH: 3.41 u[IU]/mL (ref 0.350–4.500)

## 2020-07-05 LAB — MAGNESIUM
Magnesium: 1.8 mg/dL (ref 1.7–2.4)
Magnesium: 1.7 mg/dL (ref 1.7–2.4)

## 2020-07-05 LAB — GLUCOSE, CAPILLARY
Glucose-Capillary: 127 mg/dL — ABNORMAL HIGH (ref 70–99)
Glucose-Capillary: 124 mg/dL — ABNORMAL HIGH (ref 70–99)
Glucose-Capillary: 128 mg/dL — ABNORMAL HIGH (ref 70–99)

## 2020-07-05 LAB — CBG MONITORING, ED: Glucose-Capillary: 148 mg/dL — ABNORMAL HIGH (ref 70–99)

## 2020-07-05 LAB — POTASSIUM: Potassium: 3.7 mmol/L (ref 3.5–5.1)

## 2020-07-05 MED ORDER — CALCIUM CARBONATE-VITAMIN D 500-200 MG-UNIT PO TABS
1.0000 | ORAL_TABLET | Freq: Two times a day (BID) | ORAL | Status: DC
  Administered 2020-07-05 – 2020-07-20 (×31): 1 via ORAL
  Filled 2020-07-05 (×32): qty 1

## 2020-07-05 MED ORDER — DULOXETINE HCL 30 MG PO CPEP
30.0000 mg | ORAL_CAPSULE | Freq: Every day | ORAL | Status: DC
  Administered 2020-07-05 – 2020-07-20 (×16): 30 mg via ORAL
  Filled 2020-07-05 (×17): qty 1

## 2020-07-05 MED ORDER — CALCIUM CARBONATE ANTACID 500 MG PO CHEW
1000.0000 mg | CHEWABLE_TABLET | Freq: Every day | ORAL | Status: DC
  Administered 2020-07-05 – 2020-07-14 (×10): 1000 mg via ORAL
  Filled 2020-07-05 (×11): qty 5

## 2020-07-05 MED ORDER — MAGNESIUM SULFATE 2 GM/50ML IV SOLN
2.0000 g | Freq: Once | INTRAVENOUS | Status: AC
  Administered 2020-07-05: 20:00:00 2 g via INTRAVENOUS
  Filled 2020-07-05: qty 50

## 2020-07-05 MED ORDER — CALCIUM CARBONATE-VITAMIN D 500-200 MG-UNIT PO TABS
1.0000 | ORAL_TABLET | Freq: Two times a day (BID) | ORAL | Status: DC
  Filled 2020-07-05 (×2): qty 1

## 2020-07-05 MED ORDER — CALCITRIOL 0.5 MCG PO CAPS
0.5000 ug | ORAL_CAPSULE | Freq: Every day | ORAL | Status: DC
  Administered 2020-07-05 – 2020-07-20 (×16): 0.5 ug via ORAL
  Filled 2020-07-05 (×16): qty 1

## 2020-07-05 MED ORDER — FERROUS SULFATE 325 (65 FE) MG PO TABS
325.0000 mg | ORAL_TABLET | Freq: Two times a day (BID) | ORAL | Status: DC
  Administered 2020-07-05 – 2020-07-20 (×30): 325 mg via ORAL
  Filled 2020-07-05 (×29): qty 1

## 2020-07-05 MED ORDER — CALCITRIOL 0.5 MCG PO CAPS
0.5000 ug | ORAL_CAPSULE | Freq: Every day | ORAL | Status: DC
  Filled 2020-07-05 (×2): qty 1

## 2020-07-05 MED ORDER — ATORVASTATIN CALCIUM 40 MG PO TABS
40.0000 mg | ORAL_TABLET | Freq: Every day | ORAL | Status: DC
  Administered 2020-07-05 – 2020-07-20 (×16): 40 mg via ORAL
  Filled 2020-07-05 (×16): qty 1

## 2020-07-05 MED ORDER — CALCIUM CARBONATE 1250 (500 CA) MG PO TABS: 1.0000 | ORAL_TABLET | Freq: Once | ORAL | Status: DC

## 2020-07-05 MED ORDER — CALCIUM GLUCONATE-NACL 2-0.675 GM/100ML-% IV SOLN
2.0000 g | Freq: Once | INTRAVENOUS | Status: AC
  Administered 2020-07-05: 04:00:00 2000 mg via INTRAVENOUS
  Filled 2020-07-05: qty 100

## 2020-07-05 MED ORDER — VITAMIN B-12 1000 MCG PO TABS
1000.0000 ug | ORAL_TABLET | Freq: Every day | ORAL | Status: DC
  Administered 2020-07-05 – 2020-07-20 (×16): 1000 ug via ORAL
  Filled 2020-07-05 (×16): qty 1

## 2020-07-05 MED ORDER — POTASSIUM CHLORIDE 10 MEQ/100ML IV SOLN
10.0000 meq | INTRAVENOUS | Status: AC
  Administered 2020-07-05 (×6): 10 meq via INTRAVENOUS
  Filled 2020-07-05 (×6): qty 100

## 2020-07-05 NOTE — ED Notes (Signed)
Echo at bedside

## 2020-07-05 NOTE — Progress Notes (Signed)
  Echocardiogram 2D Echocardiogram has been performed.  [name] 07/05/2020, 11:49 AM

## 2020-07-05 NOTE — ED Notes (Signed)
Lunch Tray Ordered @ 1038.  

## 2020-07-05 NOTE — Progress Notes (Signed)
PHARMACY - PHYSICIAN COMMUNICATION CRITICAL VALUE ALERT - BLOOD CULTURE IDENTIFICATION (BCID)  [name] is an 54 y.o. female who presented to [facility] on 07/04/2020 with a chief complaint of heart failure exacerbation   Assessment:  Bcx 1/2 anaerobic bottle growing GPC, BCID positive for staph epidermidis, no resistance. Received Ceftriaxone and azithromycin x1 after cultures drawn. WBC wnl. Afebril. Likely contaminant.   Name of physician (or Provider) Contacted: Dr. Hadassah Pais  Current antibiotics: none  Changes to prescribed antibiotics recommended:  No antibiotics recommended   No results found for this or any previous visit.  Benetta Spar, PharmD, BCPS, BCCP Clinical Pharmacist  Please check AMION for all [place] phone numbers After 10:00 PM, call [place] [phone]

## 2020-07-05 NOTE — Evaluation (Signed)
Physical Therapy Evaluation Patient Details Name: [name] MRN: [id number] DOB: 1965-08-17 Today's Date: 07/05/2020   History of Present Illness  [name] is 54yo female with HFrEF (Echo 03/2020: EF 25-30%), paroxysmal atrial fibrillation, CKD IV s/p renal transplant on chronic immunosuppression, hypertension, type II diabetes mellitus, hx DVT of R axillary vein on Eliquis, GERD admitted 12/28 for acute on chronic heart failure.  Clinical Impression  Prior to admission, pt states she is a limited household ambulator with a walker and requires assist for ADL's. Pt presents with significant change from her functional baseline, demonstrating gross weakness, BLE edema, poor activity tolerance, pain, and balance deficits. Pt requiring heavy max-total assist for bed mobility and unable to tolerate sitting up on edge of bed > 1 minute. Recommending SNF at discharge to maximize functional mobility and decrease caregiver burden.    Follow Up Recommendations SNF    Equipment Recommendations  Wheelchair (measurements PT);Wheelchair cushion (measurements PT);Hospital bed;3in1 (PT) (hoyer lift)    Recommendations for Other Services       Precautions / Restrictions Precautions Precautions: Fall Restrictions Weight Bearing Restrictions: No      Mobility  Bed Mobility Overal bed mobility: Needs Assistance Bed Mobility: Supine to Sit;Sit to Supine;Rolling     Supine to sit: Max assist Sit to supine: Max assist   General bed mobility comments: Heavy maxA for BLE and trunk management for supine <> sit. Max cues for use of bed rail. TotalA for rolling to right and left to remove soiled linens and replace; pt not initiating    Transfers                 General transfer comment: unable  Ambulation/Gait                Stairs            Wheelchair Mobility    Modified Rankin (Stroke Patients Only)       Balance Overall balance assessment: Needs  assistance Sitting-balance support: Feet unsupported;Bilateral upper extremity supported Sitting balance-Leahy Scale: Poor Sitting balance - Comments: reliant on BUE support                                     Pertinent Vitals/Pain Pain Assessment: Faces Faces Pain Scale: Hurts worst Pain Location: BLE's with palpation or movement Pain Descriptors / Indicators: Moaning;Grimacing;Guarding Pain Intervention(s): Limited activity within patient's tolerance;Monitored during session;Repositioned    Home Living Family/patient expects to be discharged to:: Private residence Living Arrangements: Children Available Help at Discharge: Family;Available 24 hours/day Type of Home: Apartment Home Access: Level entry     Home Layout: Two level;Able to live on main level with bedroom/bathroom Home Equipment: Kasandra Knudsen - single point;Shower seat;Grab bars - tub/shower;Walker - 2 wheels      Prior Function Level of Independence: Needs assistance   Gait / Transfers Assistance Needed: using walker for limited household distances  ADL's / Homemaking Assistance Needed: reports increased difficulty with ADL's and requiring assist recently        Hand Dominance        Extremity/Trunk Assessment   Upper Extremity Assessment Upper Extremity Assessment: RUE deficits/detail;LUE deficits/detail RUE Deficits / Details: Shoulder flexion AROM WFL LUE Deficits / Details: Shoulder flexion AROM WFL, reports elbow pain    Lower Extremity Assessment Lower Extremity Assessment: Generalized weakness;RLE deficits/detail;LLE deficits/detail RLE Deficits / Details: Increased edema LLE Deficits / Details: Increased  edema    Cervical / Trunk Assessment Cervical / Trunk Assessment: Other exceptions Cervical / Trunk Exceptions: increased body habitus  Communication   Communication: No difficulties  Cognition Arousal/Alertness: Awake/alert Behavior During Therapy: WFL for tasks  assessed/performed Overall Cognitive Status: Impaired/Different from baseline Area of Impairment: Safety/judgement                         Safety/Judgement: Decreased awareness of deficits            General Comments      Exercises General Exercises - Lower Extremity Ankle Circles/Pumps: Both;10 reps;Supine   Assessment/Plan    PT Assessment Patient needs continued PT services  PT Problem List Decreased strength;Decreased range of motion;Decreased activity tolerance;Decreased balance;Decreased mobility;Obesity;Pain       PT Treatment Interventions DME instruction;Gait training;Functional mobility training;Therapeutic activities;Therapeutic exercise;Balance training;Patient/family education;Wheelchair mobility training    PT Goals (Current goals can be found in the Care Plan section)  Acute Rehab PT Goals Patient Stated Goal: less pain PT Goal Formulation: With patient Time For Goal Achievement: 07/19/20 Potential to Achieve Goals: Fair    Frequency Min 2X/week   Barriers to discharge        Co-evaluation               AM-PAC PT "6 Clicks" Mobility  Outcome Measure Help needed turning from your back to your side while in a flat bed without using bedrails?: Total Help needed moving from lying on your back to sitting on the side of a flat bed without using bedrails?: Total Help needed moving to and from a bed to a chair (including a wheelchair)?: Total Help needed standing up from a chair using your arms (e.g., wheelchair or bedside chair)?: Total Help needed to walk in hospital room?: Total Help needed climbing 3-5 steps with a railing? : Total 6 Click Score: 6    End of Session   Activity Tolerance: Patient limited by pain Patient left: in bed;with call bell/phone within reach Nurse Communication: Mobility status PT Visit Diagnosis: Other abnormalities of gait and mobility (R26.89);Muscle weakness (generalized) (M62.81);Pain Pain - Right/Left:   (both) Pain - part of body: Leg    Time: 3748-2707 PT Time Calculation (min) (ACUTE ONLY): 41 min   Charges:   PT Evaluation $PT Eval Moderate Complexity: 1 Mod PT Treatments $Therapeutic Activity: 23-37 mins        Wyona Almas, PT, DPT Acute Rehabilitation Services Pager [phone] Office [phone]   Deno Etienne 07/05/2020, 5:04 PM

## 2020-07-05 NOTE — Progress Notes (Signed)
Subjective:   Patient reports she has not been as active as usual and thinks this may be due to her medical conditions but that there may be a component of depression as well. She states she has been feeling poorly for about a week, particularly with fatigue and productive cough. She also noticed increased swelling and difficulty breathing. She feels a little better this morning.   Objective:  Vital signs in last 24 hours: Vitals:   07/05/20 1100 07/05/20 1115 07/05/20 1130 07/05/20 1206  BP: 101/66 104/77 98/70 (!) 94/56  Pulse: 75 74 77 73  Resp: (!) 0 $Rem'12 10 20  'SyPG$ Temp:   97.7 F (36.5 C) 98.6 F (37 C)  TempSrc:   Oral Oral  SpO2: 100% 100% 99% (!) 79%  Weight:      Height:       General- obese female, non-diaphoretic, no acute distress Cardio- RRR, no murmurs, rubs or gallops Pulm- normal work of breathing, on 4L [place], bilateral diffuse rales appreciated through all lung fields Abd- soft, nontender, non-distended, bowel sounds present MSK- bilateral LE 2+ pitting edema Skin- chronic indurated appearing skin changes to lower abdomen and right axilla, also firm to touch Neuro- alert and oriented x3 Psych- cooperative, normal mood  Assessment/Plan:  Active Problems:   Acute on chronic heart failure [facility])  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  #Acute on chronic systolic heart failure  #Acute respiratory distress without hypoxia Likely in the setting on medication non-compliance. Recent new diagnosis of HFrEF, presumed to be due to HTN vs ischemic cardiomyopathy. Most recent ECHO showed EF 25-30%. Initially on BiPAP and now stable on 4L [place] this morning. Unclear precipitating event, low suspicion of infectious process.  Productive cough may be in the setting of HF. Will continue to monitor. Will continue aggressive diuresis and consult HF. Continue to hold metoprolol. - Consulted HF, appreciate recs - Continue IV lasix $Remove'80mg'hdUsTsO$  BID - Daily weights - Strict I&O's - Tele - F/u blood and urine cultures and GI panel  #AKI on CKD IV  #s/p renal transplant on chronic immunosuppression S/p renal transplant about ten years ago. Follows with [facility].  Baseline creatinine approximately 1.7, however, per chart review during her last visit with her nephrologist creatinine was elevated at 2.49 believed to be due to cardiorenal syndrome.  Creatinine on admission 2.94, decreased to 2.8 today.  Likely worsening cardiorenal syndrome versus prerenal.  Consulted nephrology he does not have any recommendations at this time.  They did however state if there is any concern regarding her renal transplant to obtain a renal ultrasound (of note they also stated that this will likely demonstrate hydronephrosis which is expected). - F/u urine culture and tacrolimus level - C/w mycophenolate, tacrolimus, prednisone - Strict I&O's - Avoid nephrotoxic medications - Trend BMP  #Hypokalemia K 3.0 on arrival, improved to 3.4.  - Replete K -Trend BMP  #Hypocalcemia #Elevated alkaline phosphatase #Renal osteodystrophy d/t secondary hyperparathyroidism Previous work-up for hypocalcemia demonstrated elevated PTH, low calcium and elevated alk phos and bony lesions.  Multiple myeloma work-up was unremarkable.  Her medications include Calcitrol and calcium vitamin D supplement.  Question of compliance due to low calcium on admission.  She was given 1 dose of 2 g calcium gluconate and  resumed home medications. -Continue home medications calcitriol, calcium-vitamin D supplement  #Hypertension Patient's blood pressures remain soft, continuing to hold home medications. - Hold home Imdur, hydralazine, metoprolol  #Type II  diabetes mellitus Hemoglobin A1c 5.6.  - CBG monitoring - SSI  #Paroxysmal atrial fibrillation CHA2DS2-VASC 4. Patient has history of atrial fibrillation rate controlled with metoprolol. Given her blood pressures, will hold off re-starting her home metoprolol. Can consider amiodarone for rate control if she converts to Afib. Currently hemodynamically stable in normal sinus rhythm. Continue with home eliquis. - Hold home metoprolol - C/w home eliquis   Dispo: Anticipated discharge pending clinical improvement.   Athol Bolds N, DO 07/05/2020, 2:07 PM Pager: [phone] After 5pm on weekdays and 1pm on weekends: On Call pager [phone]

## 2020-07-05 NOTE — Progress Notes (Signed)
PT Cancellation Note  Patient Details Name: [name] MRN: [id number] DOB: [birth year]   Cancelled Treatment:    Reason Eval/Treat Not Completed: Other (comment) (pt with admission RN and was trying to eat lunch)  Lyanne Co, DPT Acute Rehabilitation Services [phone]  [name] 07/05/2020, 2:49 PM

## 2020-07-05 NOTE — Consult Note (Addendum)
Advanced Heart Failure Team Consult Note   Primary Physician: [name], DO PCP-HF Cardiologist:  Dr Haroldine Laws  Reason for Consultation: A/C Systolic Heart Failure   HPI:    [name] is seen today for evaluation of A/C Systolic Heart failure at the request of Dr Rebeca Alert.   [name] is a  54 y/o AAF w/ h/o HTN, T2DM, tobacco abuse, CAD (extensive CAD on prior chest CT) and CKD s/p rt renal transplant in 2011 (HTN) at [facility]. She is followed by Dr Marval Regal. She has chronic LBBB but no other prior cardiac history. Her sister had heart transplant yesterday at [facility]. She smokes 1-2 cigarettes.   Admitted 03/2020 with acute systolic heart failure and A fib RVR.ECHO showed reduced EF 25-30%. SPEP no M Spike.  Converted with IV amio. Diuresed with IV lasix and later transitioned to lasix 80 mg daily. Had myoview cardiomyopathy more ischemic in nature and not due to AF. Given CKD and lack of high-grade anginal symptoms/severe ischemia on Myoview would not pursue coronary angiography at this time.   She has not been seen in the HF clinic. She no showed for HF appointment in November.  She does not weigh on a regular basis. Over the last month she has stopped moving around much and has noticed increased leg edema. She does not go to the store. She is able to walk in the house but not much more due to shortness of breath.  Complaining of cough and shortness of breath. SOB with exertion. No chest pain. Missed all medications on Monday/Tuesday. She has difficulty paying rent and affording food. She receives food stamps.   Yesterday she called 911 with increased shortness breath and leg edema. Given breathing treatment and transported to [facility]. Placed on Bipap on arrival. SARS 2 negative. CXR concerning for HF. Given IV lasix. Blood cultures obtained.  Started on azithromycin + ceftriaxone.     Pertinent admission labs: HS Trop 111>118, lactic acid 0.8, calcium 5.8, Albumin 2.4, creatinine  2.8, K 3.4, hgb A 1C 5.6, mag 1.8 , hgb 8.6 , TSH 3.4, and BNP 2834. [name]   Echo 03/16/2020 EF 25-30% RV mildly reduce.  FH: Sister - Heart Failure --> Transplanted 07/04/20 at [facility]    Review of Systems: [y] = yes, [ ]  = no   . General: Weight gain [Y ]; Weight loss [ ] ; Anorexia [ ] ; Fatigue [Y ]; Fever [ ] ; Chills [ ] ; Weakness [Y ]  . Cardiac: Chest pain/pressure [ ] ; Resting SOB [ Y]; Exertional SOB [Y ]; Orthopnea [Y ]; Pedal Edema [Y ]; Palpitations [ ] ; Syncope [ ] ; Presyncope [ ] ; Paroxysmal nocturnal dyspnea[ ]   . Pulmonary: Cough [ ] ; Wheezing[ ] ; Hemoptysis[ ] ; Sputum [ ] ; Snoring [ ]   . GI: Vomiting[ ] ; Dysphagia[ ] ; Melena[ ] ; Hematochezia [ ] ; Heartburn[ ] ; Abdominal pain [ ] ; Constipation [ ] ; Diarrhea [ ] ; BRBPR [ ]   . GU: Hematuria[ ] ; Dysuria [ ] ; Nocturia[ ]   . Vascular: Pain in legs with walking [ ] ; Pain in feet with lying flat [ ] ; Non-healing sores [ ] ; Stroke [ ] ; TIA [ ] ; Slurred speech [ ] ;  . Neuro: Headaches[ ] ; Vertigo[ ] ; Seizures[ ] ; Paresthesias[ ] ;Blurred vision [ ] ; Diplopia [ ] ; Vision changes [ ]   . Ortho/Skin: Arthritis [ ] ; Joint pain [Y ]; Muscle pain [ ] ; Joint swelling [ ] ; Back Pain [ Y]; Rash [ ]   . Psych: Depression[Y ]; Anxiety[ ]   .  Heme: Bleeding problems [ ] ; Clotting disorders [ ] ; Anemia [Y ]  . Endocrine: Diabetes [Y ]; Thyroid dysfunction[ ]   Home Medications Prior to Admission medications   Medication Sig Start Date End Date Taking? Authorizing Provider  acetaminophen (TYLENOL) 325 MG tablet Take 2 tablets (650 mg total) by mouth every 6 (six) hours as needed for mild pain (or Fever >/= 101). 03/24/20  Yes Marianna Payment, MD  apixaban (ELIQUIS) 5 MG TABS tablet Take 1 tablet (5 mg total) by mouth 2 (two) times daily. 04/26/20 10/01/20 Yes Angelica Pou, MD  atorvastatin (LIPITOR) 40 MG tablet Take 1 tablet (40 mg total) by mouth daily. 03/25/20  Yes Marianna Payment, MD  calcitRIOL (ROCALTROL) 0.5 MCG capsule Take 1 capsule (0.5 mcg total)  by mouth daily. 03/25/20  Yes Marianna Payment, MD  calcium-vitamin D (OSCAL WITH D) 500-200 MG-UNIT tablet Take 1 tablet by mouth 2 (two) times daily. 03/24/20  Yes Marianna Payment, MD  DULoxetine (CYMBALTA) 30 MG capsule Take 1 capsule (30 mg total) by mouth daily. 03/25/20  Yes Marianna Payment, MD  ferrous sulfate 325 (65 FE) MG tablet Take 1 tablet (325 mg total) by mouth 2 (two) times daily with a meal. 03/24/20  Yes Marianna Payment, MD  furosemide (LASIX) 80 MG tablet Take 1 tablet (80 mg total) by mouth daily. 03/24/20 03/24/21 Yes Marianna Payment, MD  guaiFENesin-dextromethorphan (ROBITUSSIN DM) 100-10 MG/5ML syrup Take 5 mLs by mouth every 4 (four) hours as needed for cough. 03/24/20  Yes Marianna Payment, MD  hydrALAZINE (APRESOLINE) 10 MG tablet Take 1 tablet (10 mg total) by mouth every 8 (eight) hours. 03/24/20  Yes Marianna Payment, MD  insulin aspart (NOVOLOG) 100 UNIT/ML injection Inject 0-20 Units into the skin 3 (three) times daily with meals. 03/24/20  Yes Marianna Payment, MD  isosorbide mononitrate (IMDUR) 30 MG 24 hr tablet Take 0.5 tablets (15 mg total) by mouth daily. 03/25/20  Yes Marianna Payment, MD  loratadine (CLARITIN) 10 MG tablet Take 1 tablet (10 mg total) by mouth daily. 03/25/20  Yes Marianna Payment, MD  metoprolol succinate (TOPROL-XL) 25 MG 24 hr tablet Take 1 tablet (25 mg total) by mouth daily. 03/25/20  Yes Marianna Payment, MD  mycophenolate (MYFORTIC) 180 MG EC tablet TAKE 3 TABLETS BY MOUTH TWICE DAILY Patient taking differently: Take 540 mg by mouth 2 (two) times daily. 03/31/20  Yes Maudie Mercury, MD  omeprazole (PRILOSEC) 40 MG capsule Take 40 mg by mouth daily. 05/22/20  Yes [provider]  predniSONE (DELTASONE) 2.5 MG tablet Take 1 tablet (2.5 mg total) by mouth daily with breakfast. 03/25/20  Yes Marianna Payment, MD  tacrolimus (PROGRAF) 1 MG capsule Take 3 capsules (3 mg total) by mouth 2 (two) times daily. 03/29/20  Yes [name], [name], DO  triamcinolone ointment (KENALOG) 0.1 %  Apply topically 2 (two) times daily as needed. 02/24/20  Yes [provider]  vitamin B-12 (CYANOCOBALAMIN) 1000 MCG tablet Take 1 tablet (1,000 mcg total) by mouth daily. 10/11/19  Yes Ladona Horns, MD  albuterol (PROVENTIL) (2.5 MG/3ML) 0.083% nebulizer solution Take 3 mLs (2.5 mg total) by nebulization every 6 (six) hours as needed for wheezing or shortness of breath. 03/24/20   Marianna Payment, MD  apixaban (ELIQUIS) 5 MG TABS tablet Take 2 tablets (10mg ) twice daily for 7 days, then 1 tablet (5mg ) twice daily Patient not taking: Reported on 07/05/2020 04/03/20 04/25/20  Angelica Pou, MD    Past Medical History: Past Medical History:  Diagnosis Date  . Blood transfusion without reported diagnosis   . 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 GERD (gastroesophageal reflux disease)    medication induced  . Hypertension   . Renal failure    hx of  . 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 [place]  . CHOLECYSTECTOMY  2008  . COLONOSCOPY  10 years ago   Dr.Hung="normal"  . DG AV DIALYSIS  SHUNT ACCESS EXIST*L* OR    . KIDNEY TRANSPLANT  2011  . UMBILICAL HERNIA REPAIR  2009    Family History: Family History  Problem Relation Age of Onset  . Hypertension Mother   . Diabetes Mother   . Atrial fibrillation Father   . Hypertension Father   . Arthritis Father   . Heart failure Sister   . Hypertension Sister        all 4 sisters  . Hypertension Brother        both brothers  . Diabetes Brother   . Colon cancer Neg Hx   . Stomach cancer Neg Hx     Social History: Social History    Socioeconomic History  . Marital status: Single    Spouse name: Not on file  . Number of children: 2  . Years of education: 24  . Highest education level: Not on file  Occupational History    Employer: UNEMPLOYED  Tobacco Use  . Smoking status: Current Some Day Smoker    Packs/day: 0.50    Types: Cigarettes    Last attempt to quit: 11/05/2017    Years since quitting: 2.6  . Smokeless tobacco: Never Used  . Tobacco comment: 10 some days   Vaping Use  . Vaping Use: Never used  Substance and Sexual Activity  . Alcohol use: Never    Alcohol/week: 0.0 standard drinks  . Drug use: Never  . Sexual activity: Never  Other Topics Concern  . 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 Difficulty of Paying Living Expenses: Somewhat hard  Food Insecurity: Food Insecurity Present  . Worried About Charity fundraiser in the Last Year: Sometimes true  . Ran Out of Food in the Last Year: Sometimes true  Transportation Needs: No Transportation Needs  . Lack of Transportation (Medical): No  . Lack of Transportation (Non-Medical): No  Physical Activity: Inactive  . Days of Exercise per Week: 0 days  . Minutes of Exercise per Session: 0 min  Stress: Not on file  Social Connections: Unknown  . Frequency of Communication with Friends and Family: More than three times a week  . Frequency of Social Gatherings with Friends and Family: More than  three times a week  . Attends Religious Services: Not on file  . Active Member of Clubs or Organizations: Not on file  . Attends Archivist Meetings: Not  on file  . Marital Status: Not on file    Allergies:  Allergies  Allergen Reactions  . Infed [Iron Dextran] Other (See Comments)    Pt has chest pain  Chest pain  . Cefazolin Itching and Nausea And Vomiting  . Ancef [Cefazolin Sodium] Other (See Comments)    Tolerated rocephin 11/03/10 Reports ancef causes itching, sweating, vomiting (x2-2009?)  . Lisinopril Swelling  . Other     Pt may be allergic to another antibiotic daughter isn't sure and pt unable to verify.    Objective:    Vital Signs:   Temp:  [97.4 F (36.3 C)-97.9 F (36.6 C)] 97.7 F (36.5 C) (12/29 1130) Pulse Rate:  [64-85] 77 (12/29 1130) Resp:  [0-22] 10 (12/29 1130) BP: (73-115)/(54-84) 98/70 (12/29 1130) SpO2:  [91 %-100 %] 99 % (12/29 1130) FiO2 (%):  [100 %] 100 % (12/28 1820) Weight:  [106.6 kg] 106.6 kg (12/28 1823)    Weight change: Filed Weights   07/04/20 1823  Weight: 106.6 kg    Intake/Output:   Intake/Output Summary (Last 24 hours) at 07/05/2020 1202 Last data filed at 07/05/2020 0445 Gross per 24 hour  Intake 100 ml  Output --  Net 100 ml      Physical Exam    General:  Anasarca  HEENT: normal Neck: supple. JVP to jaw. Carotids 2+ bilat; no bruits. No lymphadenopathy or thyromegaly appreciated. Cor: PMI nondisplaced. Regular rate & rhythm. No rubs, gallops or murmurs. Lungs: EW throughout on 4 liters Gross.  Abdomen: obese, soft, nontender, nondistended. No hepatosplenomegaly. No bruits or masses. Good bowel sounds. Extremities: no cyanosis, clubbing, rash, 3+ edema extending to thighs.  Neuro: alert & orientedx3, cranial nerves grossly intact. moves all 4 extremities w/o difficulty. Affect pleasant   Telemetry  SR 70s   EKG    SR 66 bpm   Labs   Basic Metabolic Panel: Recent Labs  Lab 07/04/20 1813 07/04/20 1904 07/04/20 2100 07/05/20 0532  NA 145 146* 141 145  K 3.0* 3.0* 2.9* 3.4*  CL 113* 111  --  113*  CO2 20*  --   --  20*  GLUCOSE 142* 131*  --  137*   BUN 33* 34*  --  34*  CREATININE 2.94* 2.80*  --  2.81*  CALCIUM 5.5*  --   --  5.8*  MG  --   --   --  1.8  PHOS  --   --   --  5.3*    Liver Function Tests: Recent Labs  Lab 07/04/20 1813 07/05/20 0532  AST 24  --   ALT 18  --   ALKPHOS 445*  --   BILITOT 0.6  --   PROT 4.9*  --   ALBUMIN 2.5* 2.4*   No results for input(s): LIPASE, AMYLASE in the last 168 hours. No results for input(s): AMMONIA in the last 168 hours.  CBC: Recent Labs  Lab 07/04/20 1813 07/04/20 1904 07/04/20 2100 07/05/20 0527  WBC 6.5  --   --  7.3  NEUTROABS 5.7  --   --  6.7  HGB 8.0* 8.8* 8.2* 8.6*  HCT 27.9* 26.0* 24.0* 28.6*  MCV 99.6  --   --  98.3  PLT 198  --   --  220    Cardiac  Enzymes: No results for input(s): CKTOTAL, CKMB, CKMBINDEX, TROPONINI in the last 168 hours.  BNP: BNP (last 3 results) Recent Labs    03/14/20 0920 07/04/20 1813  BNP >4,500.0* 8,341.9*    ProBNP (last 3 results) No results for input(s): PROBNP in the last 8760 hours.   CBG: Recent Labs  Lab 07/05/20 0852  GLUCAP 148*    Coagulation Studies: Recent Labs    07/04/20 1813  LABPROT 16.6*  INR 1.4*     Imaging   DG Chest Port 1 View  Result Date: 07/04/2020 CLINICAL DATA:  Short of breath, sepsis EXAM: PORTABLE CHEST 1 VIEW COMPARISON:  03/16/2020 FINDINGS: Single frontal view of the chest demonstrates an enlarged cardiac silhouette. There is central vascular congestion, with increased interstitial prominence, bibasilar consolidation, and bilateral effusions. Changes are more pronounced on the left. No pneumothorax. IMPRESSION: 1. Findings most consistent with congestive heart failure. Superimposed infection would be difficult to exclude. Electronically Signed   By: Randa Ngo M.D.   On: 07/04/2020 18:38      Medications:     Current Medications: . apixaban  5 mg Oral BID  . atorvastatin  40 mg Oral Daily  . calcitRIOL  0.5 mcg Oral Daily  . calcium carbonate  1,000 mg Oral  Daily  . calcium-vitamin D  1 tablet Oral BID  . DULoxetine  30 mg Oral Daily  . ferrous sulfate  325 mg Oral BID WC  . furosemide  80 mg Intravenous BID  . insulin aspart  0-5 Units Subcutaneous QHS  . insulin aspart  0-6 Units Subcutaneous TID WC  . mycophenolate  540 mg Oral BID  . pantoprazole  40 mg Oral Daily  . predniSONE  2.5 mg Oral Q breakfast  . sodium chloride flush  3 mL Intravenous Q12H  . tacrolimus  3 mg Oral BID  . vitamin B-12  1,000 mcg Oral Daily     Infusions: . sodium chloride          Assessment/Plan    1. Acute /Chronic Respiratory Failure  Initially on Bipap and later weaned to nasal cannula with stable saturations.  ? WBC/lacitic acid ok. EW on exam. Placed on antibiotics on admit..  Blood Cx obtained.   2/ A/C Systolic Heart Failure Diagnosed with HFrEF in September - had myoview - suggestive of ischemic cardiomyopathy. She was not having chest pain and given CKD cath was not pursued.  Most recent ECHO 03/2020 EF 25-30%. ECHO repeated.  - On exam she has massive volume overload. Started on lasix 80 mg twice a day. Will continue.  - BP on low side, would not add hydralazine/nitrate. Hold bb for now.   3. CKD Stage III S/P Renal Transplant 2011 [facility] Followed by Dr Marval Regal at [facility].  Creatinine on admit 2.8. Looks like her creatinine on most recent discharge was 2.2.  Will need nephrology on board.  Tacrolimus level pending.   4. PAF In SR today. Holding BB with soft BP.  Needs to restart eliquis 5 mg twice a day. May need to switch to heparin drip if procedures needed.   5.  DMII  On ssi.   6. CAD  prior chest CT 4/21 reviewed and showed multivessel CAD w/ heavily calcified LAD - High risk NST showing prior anterior infarct with mild peri-infarct ischemia and prior inferior infarct with mild peri-infarct ischemia.  Continue statin HS Trop 111>118 No chest pain.   7. H/O DVT  8. Anemia of chronic disease/IDA  Length  of Stay: 1  Amy Clegg, NP  07/05/2020, 12:02 PM  Advanced Heart Failure Team Pager [phone] (M-F; 7a - 4p)  Please contact [facility] for night-coverage after hours (4p -7a ) and weekends on [email or web address]  Patient seen with NP, agree with the above note.  She was admitted with increasing dyspnea and lower extremity edema. Creatinine 2.8 today, baseline around 2.5 in setting of prior renal transplant.  Short of breath with most exertion.  She is in NSR today.   Echo was done today, EF 25-30% with mild LV dilation, severe LVH, moderately decreased RV function/mild RV enlargement, moderate aortic insufficiency.   General: NAD Neck: JVP 14+ cm, no thyromegaly or thyroid nodule.  Lungs: Rhonchi CV: Nondisplaced PMI.  Heart regular S1/S2, no S3/S4, no murmur.  2+ edema to thighs.  No carotid bruit.  Difficult to palpate pedal pulses.  Abdomen: Soft, nontender, no hepatosplenomegaly, no distention.  Skin: Intact without lesions or rashes.  Neurologic: Alert and oriented x 3.  Psych: Normal affect. Extremities: No clubbing or cyanosis.  HEENT: Normal.   1. Acute on chronic systolic CHF: Echo today with EF 25-30% with mild LV dilation, severe LVH, moderately decreased RV function/mild RV enlargement, moderate aortic insufficiency. Cause for cardiomyopathy is uncertain.  She had Cardiolite in 9/21 showing inferior and anterior fixed defects with minimal ischemia, possible ischemic cardiomyopathy.  Not cathed given no chest pain and CKD stage IV. Of note, sister had a recent heart transplant (?familial cardiomyopathy).  On exam, she is markedly volume overloaded.  - Start with Lasix 80 mg IV bid, follow creatinine closely. - Unna boots.  - SBP around 100-110, will hold hydralazine/Imdur for now but restart if BP remains stable.  - With CKD stage IV, no ARB/ARNI/spironolactone/digoxin.  - Can restart her home Toprol XL 25 mg daily when volume better controlled.  2. CKD stage IV: History of renal  transplant at [facility].  - Continue prednisone and tacrolimus.  - Follow creatinine closely with diuresis.  3. CAD: Extensive CAD noted on prior chest CT.  She had Cardiolite in 9/21 showing inferior and anterior fixed defects with minimal ischemia, possible ischemic cardiomyopathy.  Not cathed given no chest pain and CKD stage IV. - Continue statin.  - No ASA given apixaban use.  4. Atrial fibrillation: Paroxysmal.  She is in NSR.  - Continue apixaban.  5. H/o DVT: On apixaban.  6. Type 2 DM.   Loralie Champagne 07/05/2020

## 2020-07-06 ENCOUNTER — Inpatient Hospital Stay (HOSPITAL_COMMUNITY): Payer: Medicare Other

## 2020-07-06 ENCOUNTER — Encounter (HOSPITAL_COMMUNITY): Payer: Self-pay | Admitting: Internal Medicine

## 2020-07-06 DIAGNOSIS — I5043 Acute on chronic combined systolic (congestive) and diastolic (congestive) heart failure: Secondary | ICD-10-CM

## 2020-07-06 LAB — GLUCOSE, CAPILLARY
Glucose-Capillary: 128 mg/dL — ABNORMAL HIGH (ref 70–99)
Glucose-Capillary: 133 mg/dL — ABNORMAL HIGH (ref 70–99)
Glucose-Capillary: 138 mg/dL — ABNORMAL HIGH (ref 70–99)
Glucose-Capillary: 123 mg/dL — ABNORMAL HIGH (ref 70–99)

## 2020-07-06 LAB — CBC
HCT: 27.2 % — ABNORMAL LOW (ref 36.0–46.0)
Hemoglobin: 8.2 g/dL — ABNORMAL LOW (ref 12.0–15.0)
MCH: 29.6 pg (ref 26.0–34.0)
MCHC: 30.1 g/dL (ref 30.0–36.0)
MCV: 98.2 fL (ref 80.0–100.0)
Platelets: 217 10*3/uL (ref 150–400)
RBC: 2.77 MIL/uL — ABNORMAL LOW (ref 3.87–5.11)
RDW: 17.8 % — ABNORMAL HIGH (ref 11.5–15.5)
WBC: 6.3 10*3/uL (ref 4.0–10.5)
nRBC: 0 % (ref 0.0–0.2)

## 2020-07-06 LAB — RENAL FUNCTION PANEL
Albumin: 2.7 g/dL — ABNORMAL LOW (ref 3.5–5.0)
Anion gap: 12 (ref 5–15)
BUN: 38 mg/dL — ABNORMAL HIGH (ref 6–20)
CO2: 18 mmol/L — ABNORMAL LOW (ref 22–32)
Calcium: 6.1 mg/dL — CL (ref 8.9–10.3)
Chloride: 112 mmol/L — ABNORMAL HIGH (ref 98–111)
Creatinine, Ser: 2.98 mg/dL — ABNORMAL HIGH (ref 0.44–1.00)
GFR, Estimated: 18 mL/min — ABNORMAL LOW (ref >60)
Glucose, Bld: 110 mg/dL — ABNORMAL HIGH (ref 70–99)
Phosphorus: 5.3 mg/dL — ABNORMAL HIGH (ref 2.5–4.6)
Potassium: 3.5 mmol/L (ref 3.5–5.1)
Sodium: 142 mmol/L (ref 135–145)

## 2020-07-06 LAB — PROTEIN / CREATININE RATIO, URINE
Creatinine, Urine: 69.49 mg/dL
Protein Creatinine Ratio: 1.5 mg/mg{Cre} — ABNORMAL HIGH (ref 0.00–0.15)
Total Protein, Urine: 104 mg/dL

## 2020-07-06 LAB — MAGNESIUM: Magnesium: 1.9 mg/dL (ref 1.7–2.4)

## 2020-07-06 MED ORDER — MAGNESIUM SULFATE IN D5W 1-5 GM/100ML-% IV SOLN
1.0000 g | Freq: Once | INTRAVENOUS | Status: AC
  Administered 2020-07-06: 17:00:00 1 g via INTRAVENOUS
  Filled 2020-07-06: qty 100

## 2020-07-06 MED ORDER — MILRINONE LACTATE IN DEXTROSE 20-5 MG/100ML-% IV SOLN
0.1250 ug/kg/min | INTRAVENOUS | Status: DC
  Administered 2020-07-06 – 2020-07-17 (×18): 0.125 ug/kg/min via INTRAVENOUS
  Filled 2020-07-06 (×16): qty 100

## 2020-07-06 MED ORDER — POTASSIUM CHLORIDE CRYS ER 20 MEQ PO TBCR
20.0000 meq | EXTENDED_RELEASE_TABLET | Freq: Once | ORAL | Status: AC
  Administered 2020-07-06: 11:00:00 20 meq via ORAL
  Filled 2020-07-06: qty 1

## 2020-07-06 MED ORDER — FUROSEMIDE 10 MG/ML IJ SOLN
40.0000 mg | Freq: Once | INTRAMUSCULAR | Status: AC
  Administered 2020-07-06: 11:00:00 40 mg via INTRAVENOUS
  Filled 2020-07-06: qty 4

## 2020-07-06 MED ORDER — FUROSEMIDE 10 MG/ML IJ SOLN
120.0000 mg | Freq: Two times a day (BID) | INTRAVENOUS | Status: DC
  Administered 2020-07-06 – 2020-07-07 (×3): 120 mg via INTRAVENOUS
  Filled 2020-07-06 (×2): qty 10
  Filled 2020-07-06: qty 12
  Filled 2020-07-06: qty 10
  Filled 2020-07-06: qty 12

## 2020-07-06 NOTE — Progress Notes (Signed)
Notified by lab of critical calcium this AM of 6.1, up from previously.  MD notified.  While on the phone with MD, patient also mentioned that R side of tongue felt swollen.  No difficulty swallowing or respiratory issues.  No adventisious lung sounds, O2 98% on RA.  MD also made aware due to pt having history of  Drug allergies.  No new orders for either issue at this time.  Will continue to monitor

## 2020-07-06 NOTE — Progress Notes (Signed)
Orthopedic Tech Progress Note Patient Details:  [name] 11/06/1965 [id number]  Ortho Devices Type of Ortho Device: Louretta Parma boot Ortho Device/Splint Location: Bi LE Ortho Device/Splint Interventions: Application   Post Interventions Patient Tolerated: Well   [name] [name] 07/06/2020, 9:47 PM

## 2020-07-06 NOTE — Progress Notes (Signed)
Unable to obtain CoOX lab without central line, cardiology PA made aware.

## 2020-07-06 NOTE — Evaluation (Signed)
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   Clinical Impression   Pt has been bedbound for months and homebound for a year. She stays on the second floor of her apartment. Her daughter is her caregiver. Pt is able to self feed and to groom with set up, but is otherwise dependent in sponge bathing, dressing and using bed pan. Pt desires to have a better quality of life and is willing to go to rehab in SNF when medically ready. OT will follow acutely.     Follow Up Recommendations  SNF;Supervision/Assistance - 24 hour    Equipment Recommendations  Wheelchair (measurements OT);Wheelchair cushion (measurements OT);Hospital bed;Other (comment) (hoyer lift)    Recommendations for Other Services       Precautions / Restrictions Precautions Precautions: Fall      Mobility Bed Mobility Overal bed mobility: Needs Assistance Bed Mobility: Supine to Sit;Sit to Supine     Supine to sit: Max assist Sit to supine: Max assist   General bed mobility comments: heavy max assist    Transfers                 General transfer comment: unable, will require lift equipment    Balance Overall balance assessment: Needs assistance Sitting-balance support: Feet unsupported;Bilateral upper extremity supported Sitting balance-Leahy Scale: Poor Sitting balance - Comments: reliant on BUE support                                   ADL either performed or assessed with clinical judgement   ADL Overall ADL's : Needs assistance/impaired Eating/Feeding: Set up;Bed level    Grooming: Set up;Bed level   Upper Body Bathing: Maximal assistance;Bed level   Lower Body Bathing: Bed level;Total assistance   Upper Body Dressing : Minimal assistance;Bed level   Lower Body Dressing: Total assistance;Bed level       Toileting- Clothing Manipulation and Hygiene: Bed level;Total assistance Toileting - Clothing Manipulation Details (indicate cue type and reason): has been using a bedpan             Vision Baseline Vision/History: Wears glasses Wears Glasses: Reading only Patient Visual Report: No change from baseline       Perception     Praxis      Pertinent Vitals/Pain Pain Assessment: Faces Faces Pain Scale: Hurts even more Pain Location: B LEs Pain Descriptors / Indicators: Moaning;Grimacing;Guarding Pain Intervention(s): Monitored during session;Repositioned     Hand Dominance Right   Extremity/Trunk Assessment Upper Extremity Assessment Upper Extremity Assessment: Overall WFL for tasks assessed   Lower Extremity Assessment Lower Extremity Assessment: Defer to PT evaluation   Cervical / Trunk Assessment Cervical / Trunk Assessment: Other exceptions Cervical / Trunk Exceptions: increased body habitus   Communication Communication Communication: No difficulties   Cognition Arousal/Alertness: Awake/alert Behavior During Therapy: WFL for tasks assessed/performed Overall Cognitive Status: Within Functional Limits for tasks assessed  General Comments       Exercises     Shoulder Instructions      Home Living Family/patient expects to be discharged to:: Private residence Living Arrangements: Children (daughter) Available Help at Discharge: Family;Available 24 hours/day Type of Home: Apartment Home Access: Level entry     Home Layout: Two level;Able to live on main level with bedroom/bathroom (pt lives on the second floor) Alternate Level Stairs-Number of Steps: 9 Alternate  Level Stairs-Rails: Right;Left Bathroom Shower/Tub: Teacher, early years/pre: Standard     Home Equipment: Cane - single point;Shower seat;Grab bars - tub/shower;Walker - 2 wheels;Bedside commode          Prior Functioning/Environment Level of Independence: Needs assistance  Gait / Transfers Assistance Needed: bed bound ADL's / Homemaking Assistance Needed: self feeds and grooms, otherwise dependent at bed level            OT Problem List: Decreased strength;Decreased activity tolerance;Impaired balance (sitting and/or standing);Decreased knowledge of use of DME or AE;Pain;Other (comment);Obesity      OT Treatment/Interventions: Self-care/ADL training;DME and/or AE instruction;Patient/family education;Balance training;Therapeutic activities    OT Goals(Current goals can be found in the care plan section) Acute Rehab OT Goals Patient Stated Goal: to get stronger OT Goal Formulation: With patient Time For Goal Achievement: 07/20/20 Potential to Achieve Goals: Good  OT Frequency: Min 2X/week   Barriers to D/C:            Co-evaluation              AM-PAC OT "6 Clicks" Daily Activity     Outcome Measure Help from another person eating meals?: None Help from another person taking care of personal grooming?: A Little Help from another person toileting, which includes using toliet, bedpan, or urinal?: Total Help from another person bathing (including washing, rinsing, drying)?: A Lot Help from another person to put on and taking off regular upper body clothing?: A Lot Help from another person to put on and taking off regular lower body clothing?: Total 6 Click Score: 13   End of Session    Activity Tolerance: Patient tolerated treatment well Patient left: in bed;with call bell/phone within reach;with family/visitor present  OT Visit Diagnosis: Pain;Muscle weakness (generalized) (M62.81)                Time: 6962-9528 OT Time Calculation (min): 25  min Charges:  OT General Charges $OT Visit: 1 Visit OT Evaluation $OT Eval Moderate Complexity: 1 Mod OT Treatments $Therapeutic Activity: 8-22 mins  [name], OTR/L Acute Rehabilitation Services Pager: [phone] Office: [phone]  [name] 07/06/2020, 10:05 AM

## 2020-07-06 NOTE — Progress Notes (Signed)
Subjective:   [name] continues to endorse chest tightness with coughing that has improved since admission. Continues to feel short of breath. She says her daughter picked her up albuterol to use at home although does not endorse using this regularly. Denies history of asthma or COPD. She does not feeling like her swelling has improved with diuresis. She states the swelling of her right arm has been worse than the left since she was diagnosed with DVT last admission. Denies arm pain. She feels the urge to urinate, although feels she is unable to do so. Did urinate some overnight although less than usual. Denies enies dysuria, change in color of urine, abdominal or flank pain. Notes left-sided tongue numbness and believes she bit her tongue overnight. Denies history of seizures. Has not been taking her calcium at home. Does not seem to take home medications regularly as prescribed.   Objective:  Vital signs in last 24 hours: Vitals:   07/05/20 1900 07/06/20 0000 07/06/20 0328 07/06/20 1116  BP: 139/90 (!) 124/92 136/77   Pulse: 75 63    Resp: $Remo'15 16 14   'povwp$ Temp: 98.5 F (36.9 C) 98.5 F (36.9 C) 98.6 F (37 C) 98.1 F (36.7 C)  TempSrc: Oral Oral Oral Oral  SpO2: 100% 100% 100%   Weight:   (!) 148.3 kg   Height:       General- Morbidly obese. Resting comfortably in no acute distress. Cardio- RRR, no murmurs, rubs or gallops. Bilateral 2+ LE edema, R > L and RUE pitting edema present. Pulm- patient is tachypneic with increased work of breathing, although saturating 100% on room air. Expiratory wheezing present throughout all lung fields.  Abd- indurated although not tender or distended. Bowel sounds intact.  GU: No CVA tenderness. 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  #Acute on chronic combined CHF  #Acute respiratory distress without hypoxia Recent new diagnosis of HFrEF, presumed to be due to HTN vs ischemic cardiomyopathy. ECHO yesterday showed LVEF 25-30% with severe LVH, mild LV dilation, Grade III Diastolic Dysfunction, elevated LA pressure, moderately reduced RV function with mild increase in pulmonary artery pressure, also with moderate AR, mild-moderate TR and mild MR. LV function and AR stable since previous ECHO 03/2020 although RV function somewhat worsened. Myeloma workup previously negative, although PYP cardiac imaging had not been performed to rule out cardiac amyloidosis. Patient does have low voltage EKG despite severe LVH that seems to have rapidly progressed. Patient now saturating 100% on room air despite subjective SOB. States decreased urination despite Lasix $RemoveBefor'80mg'pEQeDECKzlmb$  IV twice daily.  - Heart failure following, appreciate their recommendations - Consider PYP imaging to r/o cardiac amyloidosis (although pt is relatively young with reduced EF) - Lasix increased to $RemoveBefo'120mg'aaPuWLwVbIi$  IV twice daily - If little response to increased in Lasix, may require central line for milrinone  - Hold on starting hydralazine, nitrate, beta blocker given low BP  - Daily weights -  Strict I&O's - Continuous telemetry  - 1/2 blood cultures positive for Staph epidermidis, although likely contaminant. MecA negative.   #AKI on CKD IV  #s/p renal transplant in 2011 on chronic immunosuppression S/p renal transplant about ten years ago. Follows with Dr. Marval Regal at [facility]   Patient has had progression in renal dysfunction more recently with worsening proteinuria. Creatinine on admission 2.94 and stable. Consulted nephrology who do not have further recommendations at this time and agree on cardiorenal etiology. However, also consider renal graft pathology vs. Prerenal component in the setting of recent diarrhea. Endorses difficulties urinating with decreased urination despite diuresis. Bladder scan showed 400cc's of urine, although patient did urinate 300cc's 3 hours later. - Continue to monitor for urinary retention - Will check renal transplant [place] with dopplers - Check urinary protein to Cr ratio - Will coordinate trough tacrolimus levels with nursing staff  - F/u urine culture, GI panel   - C/w mycophenolate, tacrolimus, prednisone - Strict I&O's - Avoid nephrotoxic medications - Trend renal function  #Hypokalemia, Resolved # Hypomagnesemia K 3.0 on arrival, stable at 3.5 this morning. Mg 1.9 today.  - Replete K as needed - Continue to monitor electrolytes  #Hypocalcemia # Hyperphosphatemia #Renal osteodystrophy d/t secondary hyperparathyroidism Previous work-up for hypocalcemia demonstrated elevated PTH, low calcium and elevated alk phos and bony lesions.  Multiple myeloma work-up was unremarkable.  Her medications include Calcitrol and calcium vitamin D supplement however was not taking these at home.  She was given 1 dose of 2 g calcium gluconate and resumed home medications. Calcium slowly improving with corrected calcium today of 7.1. Phosphorus 5.3. -Continue home medications calcitriol, calcium-vitamin D supplement -Continue to monitor daily  # CAD Prior chest CT 4/21 showed multivessel CAD w/ heavily calcified LAD. High risk NST showedprior anterior infarct with mild peri-infarct ischemia and prior inferior infarct with mild peri-infarct ischemia.Patient denies CP currently with flat troponins. - Continue home statin   #Hypertension Patient's  blood pressures remain soft, continuing to hold home medications. - Hold home Imdur, hydralazine, metoprolol  #Type II diabetes mellitus Hemoglobin A1c 5.6. CBG's remain stable.  - SSI  #Paroxysmal atrial fibrillation CHA2DS2-VASC 4. Patient has history of atrial fibrillation rate controlled with metoprolol. Given her recent low blood pressures, will hold off re-starting her home metoprolol. Can consider amiodarone for rate control if she converts to Afib. Currently hemodynamically stable in normal sinus rhythm. Continue with home eliquis. - Hold home metoprolol - Will add back metoprolol if BP continue to remain stable  - C/w home eliquis  Dispo: Anticipated discharge pending clinical improvement.   Code Status: Full Code  Jeralyn Bennett, MD 07/06/2020, 3:21 PM Pager: [phone] After 5pm on weekdays and 1pm on weekends: On Call pager [phone]

## 2020-07-06 NOTE — TOC Initial Note (Signed)
Transition of Care [facility]) - Initial/Assessment Note    Patient Details  Name: [name] MRN: [id number] Date of Birth: 1965/12/26  Transition of Care [facility] CM/SW Contact:    Bethann Berkshire, [place] Phone Number: 07/06/2020, 3:40 PM  Clinical Narrative:                 CSW met with pt to discuss SNF recommendation. Pt lives in apartment with her daughter in [place]. Pt is agreeable to SNF workup. CSW provides medicare star rating list. Pt will discuss SNF with her daughter. FL2 completed and bed requests sent in hub.   Expected Discharge Plan: Skilled Nursing Facility Barriers to Discharge: Continued Medical Work up   Patient Goals and CMS Choice Patient states their goals for this hospitalization and ongoing recovery are:: SNF for rehab CMS Medicare.gov Compare Post Acute Care list provided to:: Patient Choice offered to / list presented to : Patient  Expected Discharge Plan and Services Expected Discharge Plan: [place]       Living arrangements for the past 2 months: Apartment                                      Prior Living Arrangements/Services Living arrangements for the past 2 months: Apartment Lives with:: Adult Children          Need for Family Participation in Patient Care: No (Comment) Care giver support system in place?: Yes (comment)      Activities of Daily Living Home Assistive Devices/Equipment: Gilford Rile (specify type) ADL Screening (condition at time of admission) Patient's cognitive ability adequate to safely complete daily activities?: Yes Is the patient deaf or have difficulty hearing?: No Does the patient have difficulty seeing, even when wearing glasses/contacts?: No Does the patient have difficulty concentrating, remembering, or making decisions?: No Patient able to express need for assistance with ADLs?: Yes Does the patient have difficulty dressing or bathing?: No Independently performs ADLs?: Yes (appropriate for  developmental age) Does the patient have difficulty walking or climbing stairs?: Yes Weakness of Legs: Both Weakness of Arms/Hands: None  Permission Sought/Granted                  Emotional Assessment Appearance:: Appears stated age Attitude/Demeanor/Rapport: Engaged Affect (typically observed): Accepting Orientation: : Oriented to Self,Oriented to Place,Oriented to  Time,Oriented to Situation Alcohol / Substance Use: Not Applicable Psych Involvement: No (comment)  Admission diagnosis:  Acute on chronic heart failure (HCC) [I50.9] Acute on chronic respiratory failure with hypoxia (HCC) [J96.21] Acute on chronic congestive heart failure, unspecified heart failure type [facility]) [I50.9] Patient Active Problem List   Diagnosis Date Noted  . Chronic kidney disease (CKD), stage IV (severe) ([place]) 07/05/2020  . Acute on chronic heart failure ([place]) 07/04/2020  . 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 Elevated alkaline phosphatase level 04/04/2020  . DVT of right axillary vein, acute ([place]) 04/03/2020  . Iliopsoas bursitis of right hip 04/03/2020  . Paroxysmal atrial fibrillation ([place]) 03/22/2020  . Chronic kidney disease, stage 3b ([place]) 03/22/2020  . Anemia of chronic disease 03/20/2020  . Heart failure with reduced ejection fraction ([place]) 03/19/2020  . Multiple pulmonary nodules 11/14/2019  . Vitamin B 12 deficiency 10/11/2019  . Myalgia 10/11/2019  . Leg weakness, bilateral 08/19/2019  . GERD (gastroesophageal reflux disease) 01/27/2019  . Secondary hyperparathyroidism of renal origin ([place]) 06/11/2018  . Encounter for monitoring tacrolimus therapy 05/22/2018  .  Hyperlipidemia 02/12/2017  . Preventative health care 08/12/2012  . Tobacco abuse 04/30/2012  . S/p cadaver renal transplant 11/19/2011  . Type II diabetes mellitus, well controlled ([place]) 11/04/2011  . HTN (hypertension) 11/03/2011   PCP:  [name], DO Pharmacy:   [facility] [place], [place] [address] Phone: [phone] Fax: [phone]     Social Determinants of Health (SDOH) Interventions    Readmission Risk Interventions No flowsheet data found.

## 2020-07-06 NOTE — NC FL2 (Signed)
[place] LEVEL OF CARE SCREENING TOOL     IDENTIFICATION  Patient Name: [name] Birthdate: April 05, 1966 Sex: female Admission Date (Current Location): 07/04/2020  [facility] and [place] Number:  Herbalist and Address:  The . [facility], [place] [address], [place], [place] [address]      Provider Number: [id number]  Attending Physician Name and Address:  Axel Filler, *  Relative Name and Phone Number:  [name], daughter, [phone]    Current Level of Care: Hospital Recommended Level of Care: [place] Prior Approval Number:    Date Approved/Denied:   PASRR Number: [id number]  Discharge Plan: SNF    Current Diagnoses: Patient Active Problem List   Diagnosis Date Noted  . Chronic kidney disease (CKD), stage IV (severe) ([place]) 07/05/2020  . Acute on chronic heart failure ([place]) 07/04/2020  . 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 Elevated alkaline phosphatase level 04/04/2020  . DVT of right axillary vein, acute ([place]) 04/03/2020  . Iliopsoas bursitis of right hip 04/03/2020  . Paroxysmal atrial fibrillation ([place]) 03/22/2020  . Chronic kidney disease, stage 3b ([place]) 03/22/2020  . Anemia of chronic disease 03/20/2020  . Heart failure with reduced ejection fraction ([place]) 03/19/2020  . Multiple pulmonary nodules 11/14/2019  . Vitamin B 12 deficiency 10/11/2019  . Myalgia 10/11/2019  . Leg weakness, bilateral 08/19/2019  . GERD (gastroesophageal reflux disease) 01/27/2019  . Secondary hyperparathyroidism of renal origin ([place]) 06/11/2018  . Encounter for monitoring tacrolimus therapy 05/22/2018  . Hyperlipidemia 02/12/2017  . Preventative health care 08/12/2012  . Tobacco abuse 04/30/2012  . S/p cadaver renal transplant 11/19/2011  . Type II diabetes mellitus, well controlled ([place]) 11/04/2011  . HTN (hypertension) 11/03/2011    Orientation RESPIRATION BLADDER Height & Weight      Self,Time,Situation,Place  O2 ([place] 4L) External catheter Weight: (!) 326 lb 15.1 oz (148.3 kg) Height:  5\' 11"  (180.3 cm)  BEHAVIORAL SYMPTOMS/MOOD NEUROLOGICAL BOWEL NUTRITION STATUS      Incontinent Diet (see d/c summary)  AMBULATORY STATUS COMMUNICATION OF NEEDS Skin   Extensive Assist Verbally Normal                       Personal Care Assistance Level of Assistance  Bathing,Feeding,Dressing Bathing Assistance: Maximum assistance Feeding assistance: Limited assistance Dressing Assistance: Maximum assistance     Functional Limitations Info  Sight,Hearing,Speech Sight Info: Adequate Hearing Info: Adequate Speech Info: Adequate    SPECIAL CARE FACTORS FREQUENCY  PT (By licensed PT),OT (By licensed OT)     PT Frequency: 5x/week OT Frequency: 5x/week            Contractures Contractures Info: Not present    Additional Factors Info  Code Status,Allergies,Psychotropic,Insulin Sliding Scale Code Status Info: Full Allergies Info: Infed (Iron Dextran), Cefazolin, Ancef (Cefazolin Sodium), Lisinopril, Other Psychotropic Info: Cymblata Insulin Sliding Scale Info: See DC Summary       Current Medications (07/06/2020):  This is the current hospital active medication list Current Facility-Administered Medications  Medication Dose Route Frequency Provider Last Rate Last Admin  . 0.9 %  sodium chloride infusion  250 mL Intravenous PRN Madalyn Rob, MD      . acetaminophen (TYLENOL) tablet 650 mg  650 mg Oral Q6H PRN Madalyn Rob, MD      . apixaban Arne Cleveland) tablet 5 mg  5 mg Oral BID Madalyn Rob, MD   5 mg at 07/06/20 0854  . atorvastatin (LIPITOR) tablet 40  mg  40 mg Oral Daily Madalyn Rob, MD   40 mg at 07/06/20 0854  . calcitRIOL (ROCALTROL) capsule 0.5 mcg  0.5 mcg Oral Daily Sanjuan Dame, MD   0.5 mcg at 07/06/20 0855  . calcium carbonate (TUMS - dosed in mg elemental calcium) chewable tablet 1,000 mg  1,000 mg Oral Daily Sanjuan Dame, MD   1,000 mg at  07/06/20 0857  . calcium-vitamin D (OSCAL WITH D) 500-200 MG-UNIT per tablet 1 tablet  1 tablet Oral BID Sanjuan Dame, MD   1 tablet at 07/06/20 0854  . DULoxetine (CYMBALTA) DR capsule 30 mg  30 mg Oral Daily Madalyn Rob, MD   30 mg at 07/06/20 0854  . ferrous sulfate tablet 325 mg  325 mg Oral BID WC Madalyn Rob, MD   325 mg at 07/06/20 0854  . furosemide (LASIX) 120 mg in dextrose 5 % 50 mL IVPB  120 mg Intravenous BID Clegg, Amy D, NP      . insulin aspart (novoLOG) injection 0-5 Units  0-5 Units Subcutaneous QHS Madalyn Rob, MD      . insulin aspart (novoLOG) injection 0-6 Units  0-6 Units Subcutaneous TID WC Madalyn Rob, MD      . magnesium sulfate IVPB 1 g 100 mL  1 g Intravenous Once Jeralyn Bennett, MD      . milrinone (PRIMACOR) 20 MG/100 ML (0.2 mg/mL) infusion  0.125 mcg/kg/min Intravenous Continuous Clegg, Amy D, NP 5.56 mL/hr at 07/06/20 1510 0.125 mcg/kg/min at 07/06/20 1510  . mycophenolate (MYFORTIC) EC tablet 540 mg  540 mg Oral BID Madalyn Rob, MD   540 mg at 07/06/20 0854  . ondansetron (ZOFRAN) injection 4 mg  4 mg Intravenous Q6H PRN Madalyn Rob, MD      . pantoprazole (PROTONIX) EC tablet 40 mg  40 mg Oral Daily Madalyn Rob, MD   40 mg at 07/06/20 0854  . predniSONE (DELTASONE) tablet 2.5 mg  2.5 mg Oral Q breakfast Madalyn Rob, MD   2.5 mg at 07/06/20 0854  . sodium chloride flush (NS) 0.9 % injection 3 mL  3 mL Intravenous Q12H Madalyn Rob, MD   3 mL at 07/06/20 0856  . sodium chloride flush (NS) 0.9 % injection 3 mL  3 mL Intravenous PRN Madalyn Rob, MD      . tacrolimus (PROGRAF) capsule 3 mg  3 mg Oral BID Madalyn Rob, MD   3 mg at 07/06/20 0854  . vitamin B-12 (CYANOCOBALAMIN) tablet 1,000 mcg  1,000 mcg Oral Daily Madalyn Rob, MD   1,000 mcg at 07/06/20 3785     Discharge Medications: Please see discharge summary for a list of discharge medications.  Relevant Imaging Results:  Relevant Lab Results:   Additional Information SSN: [id number].  Only one Pfizer COVID-19 Vaccine on 03/24/2020  Bethann Berkshire, LCSW

## 2020-07-06 NOTE — Progress Notes (Addendum)
Advanced Heart Failure Rounding Note  PCP-Cardiologist: No primary care provider on file.   Subjective:   Yesterday diuresed with IV lasix. Minimal urine output documented. Bladder scan 400cc urine.   Remains SOB with exertion.   Lab Results  Component Value Date   CREATININE 2.98 (H) 07/06/2020   CREATININE 2.81 (H) 07/05/2020   CREATININE 2.80 (H) 07/04/2020    Objective:   Weight Range: (!) 148.3 kg Body mass index is 45.6 kg/m.   Vital Signs:   Temp:  [97.7 F (36.5 C)-98.6 F (37 C)] 98.6 F (37 C) (12/30 0328) Pulse Rate:  [63-81] 63 (12/30 0000) Resp:  [0-20] 14 (12/30 0328) BP: (94-139)/(56-92) 136/77 (12/30 0328) SpO2:  [79 %-100 %] 100 % (12/30 0328) Weight:  [148.3 kg] 148.3 kg (12/30 0328) Last BM Date: 07/04/20  Weight change: Filed Weights   07/04/20 1823 07/06/20 0328  Weight: 106.6 kg (!) 148.3 kg    Intake/Output:   Intake/Output Summary (Last 24 hours) at 07/06/2020 1000 Last data filed at 07/06/2020 0851 Gross per 24 hour  Intake 610 ml  Output --  Net 610 ml      Physical Exam   General:  No resp difficulty. Anasarca  HEENT: normal Neck: supple. JVP to jaw. Carotids 2+ bilat; no bruits. No lymphadenopathy or thryomegaly appreciated. Cor: PMI nondisplaced. Regular rate & rhythm. No rubs, gallops or murmurs. Lungs: EW throughout Abdomen: edema noted,nontender, nondistended. No hepatosplenomegaly. No bruits or masses. Good bowel sounds. Extremities: no cyanosis, clubbing, rash, R and LLE 3+ edema Neuro: alert & orientedx3, cranial nerves grossly intact. moves all 4 extremities w/o difficulty. Affect pleasant   Telemetry   SR 60-70s   EKG    N/a   Labs    CBC Recent Labs    07/04/20 1813 07/04/20 1904 07/05/20 0527 07/06/20 0705  WBC 6.5  --  7.3 6.3  NEUTROABS 5.7  --  6.7  --   HGB 8.0*   < > 8.6* 8.2*  HCT 27.9*   < > 28.6* 27.2*  MCV 99.6  --  98.3 98.2  PLT 198  --  220 217   < > = values in this interval  not displayed.   Basic Metabolic Panel Recent Labs    07/05/20 0532 07/05/20 1623 07/06/20 0302  NA 145  --  142  K 3.4* 3.7 3.5  CL 113*  --  112*  CO2 20*  --  18*  GLUCOSE 137*  --  110*  BUN 34*  --  38*  CREATININE 2.81*  --  2.98*  CALCIUM 5.8*  --  6.1*  MG 1.8 1.7 1.9  PHOS 5.3*  --  5.3*   Liver Function Tests Recent Labs    07/04/20 1813 07/05/20 0532 07/06/20 0302  AST 24  --   --   ALT 18  --   --   ALKPHOS 445*  --   --   BILITOT 0.6  --   --   PROT 4.9*  --   --   ALBUMIN 2.5* 2.4* 2.7*   No results for input(s): LIPASE, AMYLASE in the last 72 hours. Cardiac Enzymes No results for input(s): CKTOTAL, CKMB, CKMBINDEX, TROPONINI in the last 72 hours.  BNP: BNP (last 3 results) Recent Labs    03/14/20 0920 07/04/20 1813  BNP >4,500.0* 3,329.5*    ProBNP (last 3 results) No results for input(s): PROBNP in the last 8760 hours.   D-Dimer No results for input(s): DDIMER  in the last 72 hours. Hemoglobin A1C Recent Labs    07/05/20 0532  HGBA1C 5.6   Fasting Lipid Panel No results for input(s): CHOL, HDL, LDLCALC, TRIG, CHOLHDL, LDLDIRECT in the last 72 hours. Thyroid Function Tests Recent Labs    07/05/20 0532  TSH 3.410    Other results:   Imaging    ECHOCARDIOGRAM COMPLETE  Result Date: 07/05/2020    ECHOCARDIOGRAM REPORT   Patient Name:   [name] Date of Exam: 07/05/2020 Medical Rec #:  [id number]       Height:       71.0 in Accession #:    [id number]      Weight:       235.0 lb Date of Birth:  February 03, 1966       BSA:          2.258 m Patient Age:    54 years        BP:           101/66 mmHg Patient Gender: F               HR:           70 bpm. Exam Location:  Inpatient Procedure: 2D Echo, Cardiac Doppler and Color Doppler Indications:    I50.23 Acute on chronic systolic (congestive) heart failure  History:        Patient has prior history of Echocardiogram examinations, most                 recent 03/16/2020. Risk  Factors:Hypertension and Diabetes. GERD.                 Renal Transplant.  Sonographer:    Jonelle Sidle Dance Referring Phys: 90 [name]  Sonographer Comments: No subcostal window. IMPRESSIONS  1. Left ventricular ejection fraction, by estimation, is 25 to 30%. The left ventricle has severely decreased function. The left ventricle demonstrates global hypokinesis. The left ventricular internal cavity size was mildly dilated. There is severe left ventricular hypertrophy. Left ventricular diastolic parameters are consistent with Grade III diastolic dysfunction (restrictive). Elevated left atrial pressure.  2. Right ventricular systolic function is moderately reduced. The right ventricular size is mildly enlarged. There is mildly elevated pulmonary artery systolic pressure. The estimated right ventricular systolic pressure is 44.3 mmHg.  3. Left atrial size was mildly dilated.  4. The mitral valve is normal in structure. Mild mitral valve regurgitation.  5. Tricuspid valve regurgitation is mild to moderate.  6. The aortic valve is tricuspid. There is moderate calcification of the aortic valve. Aortic valve regurgitation is moderate. Mild to moderate aortic valve sclerosis/calcification is present, without any evidence of aortic stenosis. FINDINGS  Left Ventricle: Left ventricular ejection fraction, by estimation, is 25 to 30%. The left ventricle has severely decreased function. The left ventricle demonstrates global hypokinesis. The left ventricular internal cavity size was mildly dilated. There is severe left ventricular hypertrophy. Left ventricular diastolic parameters are consistent with Grade III diastolic dysfunction (restrictive). Elevated left atrial pressure. Right Ventricle: The right ventricular size is mildly enlarged. Right vetricular wall thickness was not well visualized. Right ventricular systolic function is moderately reduced. There is mildly elevated pulmonary artery systolic pressure. The  tricuspid  regurgitant velocity is 2.61 m/s, and with an assumed right atrial pressure of 8 mmHg, the estimated right ventricular systolic pressure is 15.4 mmHg. Left Atrium: Left atrial size was mildly dilated. Right Atrium: Right atrial size was normal in size. Pericardium: Trivial pericardial effusion is  present. Mitral Valve: The mitral valve is normal in structure. Mild mitral valve regurgitation. Tricuspid Valve: The tricuspid valve is normal in structure. Tricuspid valve regurgitation is mild to moderate. Aortic Valve: The aortic valve is tricuspid. There is moderate calcification of the aortic valve. Aortic valve regurgitation is moderate. Aortic regurgitation PHT measures 502 msec. Mild to moderate aortic valve sclerosis/calcification is present, without any evidence of aortic stenosis. Pulmonic Valve: The pulmonic valve was grossly normal. Pulmonic valve regurgitation is mild to moderate. Aorta: The aortic root and ascending aorta are structurally normal, with no evidence of dilitation. IAS/Shunts: The interatrial septum was not well visualized. Additional Comments: There is a small pleural effusion in the left lateral region.  LEFT VENTRICLE PLAX 2D LVIDd:         6.70 cm LVIDs:         5.20 cm LV PW:         1.50 cm LV IVS:        1.50 cm LVOT diam:     2.10 cm LV SV:         75 LV SV Index:   33 LVOT Area:     3.46 cm  RIGHT VENTRICLE RV Basal diam:  3.70 cm RV S prime:     7.51 cm/s TAPSE (M-mode): 1.5 cm LEFT ATRIUM              Index       RIGHT ATRIUM           Index LA diam:        6.20 cm  2.75 cm/m  RA Area:     21.20 cm LA Vol (A2C):   103.0 ml 45.61 ml/m RA Volume:   53.80 ml  23.82 ml/m LA Vol (A4C):   74.7 ml  33.08 ml/m LA Biplane Vol: 90.7 ml  40.16 ml/m  AORTIC VALVE LVOT Vmax:   121.95 cm/s LVOT Vmean:  75.600 cm/s LVOT VTI:    0.216 m AI PHT:      502 msec  AORTA Ao Root diam: 3.30 cm Ao Asc diam:  3.60 cm MITRAL VALVE               TRICUSPID VALVE MV Area (PHT): 3.77 cm    TR  Peak grad:   27.2 mmHg MV Decel Time: 201 msec    TR Vmax:        261.00 cm/s MV E velocity: 98.50 cm/s MV A velocity: 48.50 cm/s  SHUNTS MV E/A ratio:  2.03        Systemic VTI:  0.22 m                            Systemic Diam: 2.10 cm Oswaldo Milian MD Electronically signed by Oswaldo Milian MD Signature Date/Time: 07/05/2020/2:37:26 PM    Final      Medications:     Scheduled Medications: . apixaban  5 mg Oral BID  . atorvastatin  40 mg Oral Daily  . calcitRIOL  0.5 mcg Oral Daily  . calcium carbonate  1,000 mg Oral Daily  . calcium-vitamin D  1 tablet Oral BID  . DULoxetine  30 mg Oral Daily  . ferrous sulfate  325 mg Oral BID WC  . furosemide  40 mg Intravenous Once  . insulin aspart  0-5 Units Subcutaneous QHS  . insulin aspart  0-6 Units Subcutaneous TID WC  . mycophenolate  540 mg Oral BID  .  pantoprazole  40 mg Oral Daily  . potassium chloride  20 mEq Oral Once  . predniSONE  2.5 mg Oral Q breakfast  . sodium chloride flush  3 mL Intravenous Q12H  . tacrolimus  3 mg Oral BID  . vitamin B-12  1,000 mcg Oral Daily    Infusions: . sodium chloride    . furosemide      PRN Medications: sodium chloride, acetaminophen, ondansetron (ZOFRAN) IV, sodium chloride flush    Assessment/Plan   1. Acute /Chronic Respiratory Failure  Initially on Bipap and later weaned to nasal cannula with stable saturations.  ? WBC/lacitic acid ok. EW on exam. Placed on antibiotics on admit..  Blood Cx obtained.   2/ A/C Systolic Heart Failure Diagnosed with HFrEF in September - had myoview - suggestive of ischemic cardiomyopathy. She was not having chest pain and given CKD cath was not pursued.  Most recent ECHO 03/2020 EF 25-30%. ECHO repeated.  - Sluggish response to IV lasix 80/80. Increase lasix to 120 mg twice a day.  - May need milrinone through PIV if has ongoing poor response . Would not place PICC but favor central line given worsening renal function.  - BP on low  side, would not add hydralazine/nitrate. Hold bb for now.  - place unna boots.   3. CKD Stage III S/P Renal Transplant 2011 [facility] Followed by Dr Marval Regal at [facility].  Creatinine on admit 2.8>3 Looks like her creatinine on most recent discharge was 2.2.  Tacrolimus level pending.   4. PAF In SR today. Holding BB with soft BP.  Needs to restart eliquis 5 mg twice a day. May need to switch to heparin drip if procedures needed.   5.  DMII  On ssi.   6. CAD  prior chest CT 4/21 reviewed and showed multivessel CAD w/ heavily calcified LAD - High risk NST showingprior anterior infarct with mild peri-infarct ischemia and prior inferior infarct with mild peri-infarct ischemia. Continue statin HS Trop 111>118 No chest pain.   7. H/O DVT  8. Anemia of chronic disease/IDA  Length of Stay: 2  Tracey Clegg, NP  07/06/2020, 10:00 AM  Advanced Heart Failure Team Pager [phone] (M-F; [place])  Please contact [facility] for night-coverage after hours (4p -7a ) and weekends on [email or web address]  Patient seen with NP, agree with the above note.   She does not appear to have diuresed well overnight.  Creatinine is mildly higher at 2.98 (baseline 2.5).  She is still edematous.  NSR today.   General: NAD Neck: JVP 14 cm, no thyromegaly or thyroid nodule.  Lungs: Occasional rhonchi.  CV: Nondisplaced PMI.  Heart regular S1/S2, no S3/S4, no murmur.  2+ edema to thighs.   Abdomen: Soft, nontender, no hepatosplenomegaly, no distention.  Skin: Intact without lesions or rashes.  Neurologic: Alert and oriented x 3.  Psych: Normal affect. Extremities: No clubbing or cyanosis.  HEENT: Normal.   1. Acute on chronic systolic CHF: Echo this admission with EF 25-30% with mild LV dilation, severe LVH, moderately decreased RV function/mild RV enlargement, moderate aortic insufficiency. Cause for cardiomyopathy is uncertain.  She had Cardiolite in 9/21 showing inferior and anterior fixed  defects with minimal ischemia, possible ischemic cardiomyopathy.  Not cathed given no chest pain and CKD stage IV. Of note, sister had a recent heart transplant (?familial cardiomyopathy).  On exam, she is still markedly volume overloaded, she did not diurese well.  - Increase Lasix to 120 mg bid.  -  Unna boots still needed.  - With poor diuresis and worsening renal function, will start milrinone 0.125 to see if this can stabilize renal function and trigger diuresis. She is not a PICC candidate with elevated creatinine.  Will need RHC if no improvement.  - SBP around 90s-110s, will hold hydralazine/Imdur for now but restart if BP remains stable.  - With CKD stage IV, no ARB/ARNI/spironolactone/digoxin.  - Can restart her home Toprol XL 25 mg daily when volume better controlled.  2. CKD stage IV: History of renal transplant at [facility].  - Continue prednisone and tacrolimus.  - Follow creatinine closely with diuresis.  3. CAD: Extensive CAD noted on prior chest CT.  She had Cardiolite in 9/21 showing inferior and anterior fixed defects with minimal ischemia, possible ischemic cardiomyopathy.  Not cathed given no chest pain and CKD stage IV. - Continue statin.  - No ASA given apixaban use.  4. Atrial fibrillation: Paroxysmal.  She is in NSR.  - Continue apixaban.  5. H/o DVT: On apixaban.  6. Type 2 DM.   Loralie Champagne 07/06/2020 2:09 PM

## 2020-07-06 NOTE — Consult Note (Signed)
   [facility] Inpatient Consult   07/06/2020  [name] August 16, 1965 [id number]   [place] [ACO] Patient: Medicare   High Risk score for unplanned readmission risk discussed with progression team  Patient was screened for [place] Management services. Patient will have the transition of care call conducted by the primary care provider. Met with the patient at the bedside and she endorses the [facility] health Internal Medicine  Embedded practice.  Patient also acknowledges that she has received messages from the Embedded RNCM and has left a message as well.  Explained about the chronic disease management Embedded Care Management team and their help.  Patient continues to exhibit shortness of breath noted during the time.  Plan: Notification sent to the [place] Management team regarding admission and made aware of above needs.   Please contact for further questions,  Natividad Brood, RN BSN [facility] Liaison  [phone] business mobile phone Toll free office [phone]  Fax number: [phone] [name]

## 2020-07-07 DIAGNOSIS — I5023 Acute on chronic systolic (congestive) heart failure: Secondary | ICD-10-CM | POA: Diagnosis not present

## 2020-07-07 DIAGNOSIS — J9600 Acute respiratory failure, unspecified whether with hypoxia or hypercapnia: Secondary | ICD-10-CM

## 2020-07-07 DIAGNOSIS — D649 Anemia, unspecified: Secondary | ICD-10-CM

## 2020-07-07 DIAGNOSIS — I130 Hypertensive heart and chronic kidney disease with heart failure and stage 1 through stage 4 chronic kidney disease, or unspecified chronic kidney disease: Secondary | ICD-10-CM

## 2020-07-07 DIAGNOSIS — Z7901 Long term (current) use of anticoagulants: Secondary | ICD-10-CM

## 2020-07-07 DIAGNOSIS — N250 Renal osteodystrophy: Secondary | ICD-10-CM

## 2020-07-07 DIAGNOSIS — E1122 Type 2 diabetes mellitus with diabetic chronic kidney disease: Secondary | ICD-10-CM

## 2020-07-07 DIAGNOSIS — N2581 Secondary hyperparathyroidism of renal origin: Secondary | ICD-10-CM

## 2020-07-07 DIAGNOSIS — D8489 Other immunodeficiencies: Secondary | ICD-10-CM

## 2020-07-07 DIAGNOSIS — E876 Hypokalemia: Secondary | ICD-10-CM

## 2020-07-07 DIAGNOSIS — N184 Chronic kidney disease, stage 4 (severe): Secondary | ICD-10-CM | POA: Diagnosis not present

## 2020-07-07 LAB — CBC
HCT: 25 % — ABNORMAL LOW (ref 36.0–46.0)
Hemoglobin: 7.6 g/dL — ABNORMAL LOW (ref 12.0–15.0)
MCH: 29.3 pg (ref 26.0–34.0)
MCHC: 30.4 g/dL (ref 30.0–36.0)
MCV: 96.5 fL (ref 80.0–100.0)
Platelets: 198 10*3/uL (ref 150–400)
RBC: 2.59 MIL/uL — ABNORMAL LOW (ref 3.87–5.11)
RDW: 17.9 % — ABNORMAL HIGH (ref 11.5–15.5)
WBC: 5.4 10*3/uL (ref 4.0–10.5)
nRBC: 0 % (ref 0.0–0.2)

## 2020-07-07 LAB — VITAMIN D 25 HYDROXY (VIT D DEFICIENCY, FRACTURES): Vit D, 25-Hydroxy: 9.6 ng/mL — ABNORMAL LOW (ref 30–100)

## 2020-07-07 LAB — RENAL FUNCTION PANEL
Albumin: 2.7 g/dL — ABNORMAL LOW (ref 3.5–5.0)
Anion gap: 12 (ref 5–15)
BUN: 41 mg/dL — ABNORMAL HIGH (ref 6–20)
CO2: 19 mmol/L — ABNORMAL LOW (ref 22–32)
Calcium: 6.1 mg/dL — CL (ref 8.9–10.3)
Chloride: 110 mmol/L (ref 98–111)
Creatinine, Ser: 3.14 mg/dL — ABNORMAL HIGH (ref 0.44–1.00)
GFR, Estimated: 17 mL/min — ABNORMAL LOW (ref >60)
Glucose, Bld: 92 mg/dL (ref 70–99)
Phosphorus: 5 mg/dL — ABNORMAL HIGH (ref 2.5–4.6)
Potassium: 3.6 mmol/L (ref 3.5–5.1)
Sodium: 141 mmol/L (ref 135–145)

## 2020-07-07 LAB — GLUCOSE, CAPILLARY
Glucose-Capillary: 91 mg/dL (ref 70–99)
Glucose-Capillary: 107 mg/dL — ABNORMAL HIGH (ref 70–99)
Glucose-Capillary: 145 mg/dL — ABNORMAL HIGH (ref 70–99)
Glucose-Capillary: 138 mg/dL — ABNORMAL HIGH (ref 70–99)

## 2020-07-07 LAB — URINALYSIS, ROUTINE W REFLEX MICROSCOPIC
Bilirubin Urine: NEGATIVE
Glucose, UA: NEGATIVE mg/dL
Ketones, ur: NEGATIVE mg/dL
Nitrite: NEGATIVE
Protein, ur: 100 mg/dL — AB
Specific Gravity, Urine: 1.011 (ref 1.005–1.030)
pH: 5 (ref 5.0–8.0)

## 2020-07-07 LAB — MAGNESIUM: Magnesium: 1.9 mg/dL (ref 1.7–2.4)

## 2020-07-07 LAB — CULTURE, BLOOD (SINGLE)

## 2020-07-07 LAB — PREPARE RBC (CROSSMATCH)

## 2020-07-07 MED ORDER — CHLORHEXIDINE GLUCONATE CLOTH 2 % EX PADS
6.0000 | MEDICATED_PAD | Freq: Every day | CUTANEOUS | Status: DC
  Administered 2020-07-08 – 2020-07-20 (×13): 6 via TOPICAL

## 2020-07-07 MED ORDER — POTASSIUM CHLORIDE CRYS ER 20 MEQ PO TBCR
20.0000 meq | EXTENDED_RELEASE_TABLET | Freq: Once | ORAL | Status: AC
  Administered 2020-07-07: 20 meq via ORAL
  Filled 2020-07-07: qty 1

## 2020-07-07 MED ORDER — SODIUM CHLORIDE 0.9% IV SOLUTION: Freq: Once | INTRAVENOUS | Status: AC

## 2020-07-07 MED ORDER — MAGNESIUM SULFATE IN D5W 1-5 GM/100ML-% IV SOLN
1.0000 g | Freq: Once | INTRAVENOUS | Status: AC
  Administered 2020-07-07: 1 g via INTRAVENOUS
  Filled 2020-07-07: qty 100

## 2020-07-07 NOTE — Care Management Important Message (Signed)
Important Message  Patient Details  Name: [name] MRN: [id number] Date of Birth: [birth year]   Medicare Important Message Given:  Yes     Orbie Pyo 07/07/2020, 2:40 PM

## 2020-07-07 NOTE — Progress Notes (Signed)
Physical Therapy Treatment Patient Details Name: [name] MRN: [id number] DOB: 23-Oct-1965 Today's Date: 07/07/2020    History of Present Illness 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    PT Comments    Patient continues to be limited by pain throughout all joints, especially with movement. Session focused on there ex in bed with LEs, trunk and rolling. Requires heavy Max-total A to roll towards right side using LUE to assist as well as pads. Pt with swelling, tightness and pain in BLEs limiting AROM at knees and hips. Participatory with exercise during session but limited due to pain. Recommend lift to chair as able. Concern for pressure wounds as pt not able to reposition self well independently. Continue to recommend SNF. Will follow.   Follow Up Recommendations  SNF     Equipment Recommendations  Wheelchair (measurements PT);Wheelchair cushion (measurements PT);Hospital bed;Other (comment);3in1 (PT) (hoyer lift)    Recommendations for Other Services       Precautions / Restrictions Precautions Precautions: Fall Restrictions Weight Bearing Restrictions: No    Mobility  Bed Mobility Overal bed mobility: Needs Assistance Bed Mobility: Rolling Rolling: Max assist         General bed mobility comments: Rolling towards right using LUE to assist and pads, but limited by pain throughout hips.Romilda Joy.  Transfers                 General transfer comment: unable, will require lift equipment  Ambulation/Gait                 Stairs             Wheelchair Mobility    Modified Rankin (Stroke Patients Only)       Balance                                            Cognition  Arousal/Alertness: Awake/alert Behavior During Therapy: WFL for tasks assessed/performed Overall Cognitive Status: Within Functional Limits for tasks assessed                                 General Comments: Requires repetition at times but this is likely due to hearing?      Exercises General Exercises - Lower Extremity Ankle Circles/Pumps: Both;10 reps;Supine;AROM Quad Sets: AROM;Both;10 reps;Supine Heel Slides: AAROM;Both;5 reps;Supine Hip ABduction/ADduction: AAROM;Both;5 reps;Supine Other Exercises Other Exercises: Worked on using LUE to lift trunk off bed to activate abdominals x5    General Comments General comments (skin integrity, edema, etc.): Swelling present RUE and limited functional use.      Pertinent Vitals/Pain Pain Assessment: Faces Faces Pain Scale: Hurts whole lot Pain Location: B LEs, whole body with any movement Pain Descriptors / Indicators: Moaning;Grimacing;Guarding Pain Intervention(s): Monitored during session;Repositioned;Limited activity within patient's tolerance    Home Living                      Prior Function            PT Goals (current goals can now be found in the care plan section) Progress towards PT goals:  Progressing toward goals (very slowly)    Frequency    Min 2X/week      PT Plan Current plan remains appropriate    Co-evaluation              AM-PAC PT "6 Clicks" Mobility   Outcome Measure  Help needed turning from your back to your side while in a flat bed without using bedrails?: Total Help needed moving from lying on your back to sitting on the side of a flat bed without using bedrails?: Total Help needed moving to and from a bed to a chair (including a wheelchair)?: Total Help needed standing up from a chair using your arms (e.g., wheelchair or bedside chair)?: Total Help needed to walk in hospital room?: Total Help needed climbing 3-5 steps with a railing? : Total 6 Click Score:  6    End of Session   Activity Tolerance: Patient limited by pain Patient left: in bed;with call bell/phone within reach;with bed alarm set Nurse Communication: Mobility status;Need for lift equipment PT Visit Diagnosis: Other abnormalities of gait and mobility (R26.89);Muscle weakness (generalized) (M62.81);Pain Pain - Right/Left:  (bil) Pain - part of body: Leg     Time: 8099-8338 PT Time Calculation (min) (ACUTE ONLY): 19 min  Charges:  $Therapeutic Exercise: 8-22 mins                     Marisa Severin, PT, DPT Acute Rehabilitation Services Pager [phone] Office [phone]       Marguarite Arbour A Sabra Heck 07/07/2020, 11:57 AM

## 2020-07-07 NOTE — Progress Notes (Signed)
Progress Note  Patient Name: [name] Date of Encounter: 07/07/2020  [facility] Cardiologist: No primary care provider on file.   Subjective   Feels about the same as she did yesterday.  Laying in bed.  No chest pain.  Shortness of breath with minimal activity.  Mild improvement in urinary output with milrinone and increased Lasix.  Inpatient Medications    Scheduled Meds: . apixaban  5 mg Oral BID  . atorvastatin  40 mg Oral Daily  . calcitRIOL  0.5 mcg Oral Daily  . calcium carbonate  1,000 mg Oral Daily  . calcium-vitamin D  1 tablet Oral BID  . DULoxetine  30 mg Oral Daily  . ferrous sulfate  325 mg Oral BID WC  . insulin aspart  0-5 Units Subcutaneous QHS  . insulin aspart  0-6 Units Subcutaneous TID WC  . mycophenolate  540 mg Oral BID  . pantoprazole  40 mg Oral Daily  . predniSONE  2.5 mg Oral Q breakfast  . sodium chloride flush  3 mL Intravenous Q12H  . tacrolimus  3 mg Oral BID  . vitamin B-12  1,000 mcg Oral Daily   Continuous Infusions: . sodium chloride    . furosemide 120 mg (07/06/20 1741)  . magnesium sulfate bolus IVPB 1 g (07/07/20 0828)  . milrinone 0.125 mcg/kg/min (07/07/20 0542)   PRN Meds: sodium chloride, acetaminophen, ondansetron (ZOFRAN) IV, sodium chloride flush   Vital Signs    Vitals:   07/06/20 1535 07/07/20 0000 07/07/20 0430 07/07/20 0829  BP:  (!) 148/97 140/86   Pulse:  72 66 74  Resp:  12 18 13   Temp: 98.2 F (36.8 C) 98.2 F (36.8 C) 98.2 F (36.8 C)   TempSrc: Oral  Oral   SpO2:  100% 99% 100%  Weight:   (!) 149.2 kg   Height:        Intake/Output Summary (Last 24 hours) at 07/07/2020 0859 Last data filed at 07/07/2020 0546 Gross per 24 hour  Intake 1077.31 ml  Output 1100 ml  Net -22.69 ml   Last 3 Weights 07/07/2020 07/06/2020 07/04/2020  Weight (lbs) 328 lb 14.8 oz 326 lb 15.1 oz 235 lb 0.2 oz  Weight (kg) 149.2 kg 148.3 kg 106.6 kg      Telemetry    Sinus rhythm- Personally Reviewed  ECG     Sinus rhythm, no new- Personally Reviewed  Physical Exam   GEN: No acute distress.   Neck: No JVD Cardiac: RRR, no murmurs, rubs, or gallops.  Respiratory: Clear to auscultation bilaterally.  Minor wheeze heard at end expiration GI: Soft, nontender, non-distended  MS: No edema; No deformity. Neuro:  Nonfocal  Psych: Normal affect   Labs    High Sensitivity Troponin:   Recent Labs  Lab 07/04/20 1813 07/04/20 2156  TROPONINIHS 111* 118*      Chemistry Recent Labs  Lab 07/04/20 1813 07/04/20 1904 07/05/20 0532 07/05/20 1623 07/06/20 0302 07/07/20 0254  NA 145   < > 145  --  142 141  K 3.0*   < > 3.4* 3.7 3.5 3.6  CL 113*   < > 113*  --  112* 110  CO2 20*  --  20*  --  18* 19*  GLUCOSE 142*   < > 137*  --  110* 92  BUN 33*   < > 34*  --  38* 41*  CREATININE 2.94*   < > 2.81*  --  2.98* 3.14*  CALCIUM  5.5*  --  5.8*  --  6.1* 6.1*  PROT 4.9*  --   --   --   --   --   ALBUMIN 2.5*  --  2.4*  --  2.7* 2.7*  AST 24  --   --   --   --   --   ALT 18  --   --   --   --   --   ALKPHOS 445*  --   --   --   --   --   BILITOT 0.6  --   --   --   --   --   GFRNONAA 18*  --  19*  --  18* 17*  ANIONGAP 12  --  12  --  12 12   < > = values in this interval not displayed.     Hematology Recent Labs  Lab 07/05/20 0527 07/06/20 0705 07/07/20 0254  WBC 7.3 6.3 5.4  RBC 2.91* 2.77* 2.59*  HGB 8.6* 8.2* 7.6*  HCT 28.6* 27.2* 25.0*  MCV 98.3 98.2 96.5  MCH 29.6 29.6 29.3  MCHC 30.1 30.1 30.4  RDW 17.8* 17.8* 17.9*  PLT 220 217 198    BNP Recent Labs  Lab 07/04/20 1813  BNP 2,834.2*     DDimer No results for input(s): DDIMER in the last 168 hours.   Radiology    US Renal Transplant w/Doppler  Result Date: 07/06/2020 CLINICAL DATA:  Acute kidney injury on chronic kidney disease. EXAM: ULTRASOUND OF RENAL TRANSPLANT WITH RENAL DOPPLER ULTRASOUND TECHNIQUE: Ultrasound examination of the renal transplant was performed with gray-scale, color and duplex doppler  evaluation. COMPARISON:  CT 03/17/2020 FINDINGS: Transplant kidney location: Right lower quadrant Transplant Kidney: Renal measurements: 12.6 x 6.9 x 5.6 cm = volume: 252mL. Normal in size and parenchymal echogenicity. No evidence of mass or hydronephrosis. A small amount of perinephric fluid is demonstrated. Color flow in the main renal artery:  Present Color flow in the main renal vein:  Present Duplex Doppler Evaluation: Main Renal Artery Velocity: 33 cm/sec Main Renal Artery Resistive Index: 0.8 Venous waveform in main renal vein:  Present Intrarenal resistive index in upper pole:  0.7 (normal 0.6-0.8; equivocal 0.8-0.9; abnormal >= 0.9) Intrarenal resistive index in lower pole: 0.9 (normal 0.6-0.8; equivocal 0.8-0.9; abnormal >= 0.9) Bladder: Normal for degree of bladder distention. Other findings:  None. IMPRESSION: 1. Normal ultrasound appearance of the right lower quadrant renal transplant with small amount of perinephric fluid. No evidence of hydronephrosis. 2. Resistive indices are borderline elevated at 0.8. Electronically Signed   By: Lucienne Capers M.D.   On: 07/06/2020 19:55   ECHOCARDIOGRAM COMPLETE  Result Date: 07/05/2020    ECHOCARDIOGRAM REPORT   Patient Name:   [name] Date of Exam: 07/05/2020 Medical Rec #:  [id number]       Height:       71.0 in Accession #:    [id number]      Weight:       235.0 lb Date of Birth:  May 29, 1966       BSA:          2.258 m Patient Age:    54 years        BP:           101/66 mmHg Patient Gender: F               HR:           70 bpm.  Exam Location:  Inpatient Procedure: 2D Echo, Cardiac Doppler and Color Doppler Indications:    I50.23 Acute on chronic systolic (congestive) heart failure  History:        Patient has prior history of Echocardiogram examinations, most                 recent 03/16/2020. Risk Factors:Hypertension and Diabetes. GERD.                 Renal Transplant.  Sonographer:    Jonelle Sidle Dance Referring Phys: 76 [name]   Sonographer Comments: No subcostal window. IMPRESSIONS  1. Left ventricular ejection fraction, by estimation, is 25 to 30%. The left ventricle has severely decreased function. The left ventricle demonstrates global hypokinesis. The left ventricular internal cavity size was mildly dilated. There is severe left ventricular hypertrophy. Left ventricular diastolic parameters are consistent with Grade III diastolic dysfunction (restrictive). Elevated left atrial pressure.  2. Right ventricular systolic function is moderately reduced. The right ventricular size is mildly enlarged. There is mildly elevated pulmonary artery systolic pressure. The estimated right ventricular systolic pressure is 01.6 mmHg.  3. Left atrial size was mildly dilated.  4. The mitral valve is normal in structure. Mild mitral valve regurgitation.  5. Tricuspid valve regurgitation is mild to moderate.  6. The aortic valve is tricuspid. There is moderate calcification of the aortic valve. Aortic valve regurgitation is moderate. Mild to moderate aortic valve sclerosis/calcification is present, without any evidence of aortic stenosis. FINDINGS  Left Ventricle: Left ventricular ejection fraction, by estimation, is 25 to 30%. The left ventricle has severely decreased function. The left ventricle demonstrates global hypokinesis. The left ventricular internal cavity size was mildly dilated. There is severe left ventricular hypertrophy. Left ventricular diastolic parameters are consistent with Grade III diastolic dysfunction (restrictive). Elevated left atrial pressure. Right Ventricle: The right ventricular size is mildly enlarged. Right vetricular wall thickness was not well visualized. Right ventricular systolic function is moderately reduced. There is mildly elevated pulmonary artery systolic pressure. The tricuspid  regurgitant velocity is 2.61 m/s, and with an assumed right atrial pressure of 8 mmHg, the estimated right ventricular systolic pressure  is 01.0 mmHg. Left Atrium: Left atrial size was mildly dilated. Right Atrium: Right atrial size was normal in size. Pericardium: Trivial pericardial effusion is present. Mitral Valve: The mitral valve is normal in structure. Mild mitral valve regurgitation. Tricuspid Valve: The tricuspid valve is normal in structure. Tricuspid valve regurgitation is mild to moderate. Aortic Valve: The aortic valve is tricuspid. There is moderate calcification of the aortic valve. Aortic valve regurgitation is moderate. Aortic regurgitation PHT measures 502 msec. Mild to moderate aortic valve sclerosis/calcification is present, without any evidence of aortic stenosis. Pulmonic Valve: The pulmonic valve was grossly normal. Pulmonic valve regurgitation is mild to moderate. Aorta: The aortic root and ascending aorta are structurally normal, with no evidence of dilitation. IAS/Shunts: The interatrial septum was not well visualized. Additional Comments: There is a small pleural effusion in the left lateral region.  LEFT VENTRICLE PLAX 2D LVIDd:         6.70 cm LVIDs:         5.20 cm LV PW:         1.50 cm LV IVS:        1.50 cm LVOT diam:     2.10 cm LV SV:         75 LV SV Index:   33 LVOT Area:     3.46 cm  RIGHT VENTRICLE RV Basal diam:  3.70 cm RV S prime:     7.51 cm/s TAPSE (M-mode): 1.5 cm LEFT ATRIUM              Index       RIGHT ATRIUM           Index LA diam:        6.20 cm  2.75 cm/m  RA Area:     21.20 cm LA Vol (A2C):   103.0 ml 45.61 ml/m RA Volume:   53.80 ml  23.82 ml/m LA Vol (A4C):   74.7 ml  33.08 ml/m LA Biplane Vol: 90.7 ml  40.16 ml/m  AORTIC VALVE LVOT Vmax:   121.95 cm/s LVOT Vmean:  75.600 cm/s LVOT VTI:    0.216 m AI PHT:      502 msec  AORTA Ao Root diam: 3.30 cm Ao Asc diam:  3.60 cm MITRAL VALVE               TRICUSPID VALVE MV Area (PHT): 3.77 cm    TR Peak grad:   27.2 mmHg MV Decel Time: 201 msec    TR Vmax:        261.00 cm/s MV E velocity: 98.50 cm/s MV A velocity: 48.50 cm/s  SHUNTS MV E/A  ratio:  2.03        Systemic VTI:  0.22 m                            Systemic Diam: 2.10 cm Oswaldo Milian MD Electronically signed by Oswaldo Milian MD Signature Date/Time: 07/05/2020/2:37:26 PM    Final     Cardiac Studies   EF 25-30  Patient Profile     54 y.o. female post renal transplant here with acute on chronic systolic heart failure  Assessment & Plan    Acute on chronic systolic heart failure -EF 25 to 30%, severe LVH moderately decreased RV function -Cause uncertain, possible hypertensive -Sister also had recent heart transplant, question familial cardiomyopathy. -Recently increase Lasix to 120 mg twice a day.  Poor diuresis. -Output was 1.1 L yesterday but still net positive.  Continue with current treatment plan. -Milrinone 0.125 mg started 07/06/2020 to see if this helps trigger diuresis.  Cannot utilize PICC line because of elevated creatinine and potential need for hemodialysis in the future. -Potentially may need right heart catheterization to assess volume status if no improvement but clinically she is clearly overloaded. -Hydralazine and isosorbide have been held because of blood pressures between 90 and 110.  Unable to utilize ARB or Arni or spironolactone or digoxin given her chronic kidney disease stage IV.  Blood pressure currently improved 140/86 at last check. -Plan to resume Toprol when able.  CKD stage IV -Prior renal transplant failed.  [facility]  On prednisone and tacrolimus.  Nephrology. -Creatinine staying around 3. -Anemia noted between 7 and 8 hemoglobin  Coronary artery disease -Extensive CAD/calcified plaque noted on prior chest CT. nuclear stress test 03/2020 showed inferior and anterior fixed defects minimal ischemia.  Possible ischemic cardiomyopathy.  Unable to cath given chronic kidney disease stage IV. -Not on aspirin because of apixaban.  Morbid obesity -149 kg.  Paroxysmal atrial fibrillation -Currently in sinus rhythm.   Doing well.  Chronic anticoagulation -On Eliquis.  No bleeding.  Prior history of DVT -On Eliquis  Diabetes with cardiomyopathy -Glucose being closely monitored.     For questions or updates, please contact  [facility] Please consult [email or web address] for contact info under        Signed, Candee Furbish, MD  07/07/2020, 8:59 AM

## 2020-07-07 NOTE — Progress Notes (Signed)
   07/07/20 1521  Height and Weight  Weight (!) 148.3 kg  Type of Scale Used Bed  Type of Weight Actual  BMI (Calculated) 45.[name]

## 2020-07-07 NOTE — Progress Notes (Addendum)
Subjective:   Patient evaluated at bedside. States she feels roughly the same today as yesterday. Reports that the swelling in her legs feels better, although swelling in her right hand has worsened with weeping of fluid, but without pain. Endorses mild lightheadedness today. Has not been able to get out of bed since coming to the hospital. Per nursing has chronic urinary and stool incontinence, although patient states both of these are new since her admission here. Denies any abdominal pain, CP, flank pain.  Objective:  Vital signs in last 24 hours: Vitals:   07/06/20 1535 07/07/20 0000 07/07/20 0430 07/07/20 0829  BP:  (!) 148/97 140/86   Pulse:  72 66 74  Resp:  12 18 13   Temp: 98.2 F (36.8 C) 98.2 F (36.8 C) 98.2 F (36.8 C)   TempSrc: Oral  Oral   SpO2:  100% 99% 100%  Weight:   (!) 149.2 kg   Height:       General- Morbidly obese. Resting comfortably in no acute distress. Cardio- RRR, no murmurs, rubs or gallops. Bilateral 1+ LE edema, R > L and RUE pitting edema present with weeping that is new.  Pulm- Minimally increased work of breathing without tachypnea, saturating 100% on room air. Expiratory wheezing present throughout all lung fields.  Skin- Chronic diffuse skin induration on abdomen and extremities. Extremities are cool to the touch. Neuro- alert and oriented x3  Assessment/Plan:  Principal Problem:   Acute on chronic heart failure (HCC) Active Problems:   S/p cadaver renal transplant   Paroxysmal atrial fibrillation (HCC)   Chronic kidney disease (CKD), stage IV (severe) (HCC)  Ms. Nicci Vaughan is 54yo female with HFrEF (Echo 03/2020: EF 25-30%), paroxysmal atrial fibrillation, CKD IV s/p renal transplant on chronic immunosuppression, hypertension, type II diabetes mellitus, hx DVT of R axillary vein on Eliquis admitted 12/28 for acute on chronic heart failure.  #Acute on chronic combined CHF  #Acute respiratory distress without hypoxia Recent new  diagnosis of HFrEF, presumed to be due to HTN vs ischemic cardiomyopathy. Myeloma workup previously negative, although PYP cardiac imaging had not been performed to rule out cardiac amyloidosis. ECHO here significant for LVEF 25-30% with severe LVH, Grade III Diastolic Dysfunction, moderately reduced RV function with mild increase in pulmonary artery pressure and moderate AR. RV dysfunction slightly worse than ECHO 03/2020 (when diagnosed with CHF). Patient continues to saturate 100% on room air with improvement in subjective SOB. Lasix increased from 80mg  IV BID to 120mg  IV BID yesterday, and she was started on milrinone through peripheral IV, as PICC couldn't be placed due to kidney function and possible need for HD in the future. Continues to have poor diuresis, net positive I&O. - Heart failure following, appreciate their recommendations - Will hold off on PYP imaging at this time due to age and low EF  - Continue Lasix 120mg  twice daily - Continue milrinone 0.125mg  (day 2) - hydralazine, nitrate, and beta blockers were held due to soft pressures - Consider addition of Toprol, per cardiology - Daily weights - Strict I&O's - Continuous telemetry   #AKI on CKD IV  #s/p renal transplant in 2011 on chronic immunosuppression Follows with Dr. Marval Regal at [facility]. Patient has had progression in renal dysfunction more recently with worsening proteinuria. Creatinine on admission 2.94, continuing to increase. Consulted nephrology who agreed with cardiorenal etiology. Patient noted to have urinary incontinence, which she states is new since admission. Denies flank pain or dysuria. Urinalysis with >300 protein and  small hemoglobin, but otherwise unremarkable. Urine Protein:Cr 1.5. Repeat bladder scan today showed >400cc's urine and I&O cath was performed. Transplant ultrasound showed normal echogenicity of RLQ renal transplant with small perinephric fluid, although no hydronephrosis and borderline  elevated resistance indices "normal for degree of bladder distention".   - Foley catheter placed for retention and strict I&O - Will coordinate trough tacrolimus level lab draw before evening dose tonight. - F/u urine culture, GI panel   - C/w mycophenolate, tacrolimus, prednisone - Strict I&O's - Avoid nephrotoxic medications - Trend renal function  # Normocytic Anemia  All cell counts have decreased since admission. Hgb down to 7.6 today without any obvious source of bleeding.  - Will transfuse 1 unit pRBC - F/u post-transfusion H&H - Continue daily CBC - Repeat transfusion if Hgb < 8.0  #Hypokalemia, Resolved #Hypomagnesemia Potassium remains stable. Magnesium remains low at 1.9 despite replacement. Goal Mg > 2.  - Will give Mg Sulfate 2g IVPB - Recheck morning magnesium - If low tomorrow morning, will start magnesium oxide, which patient appears to take outpatient  # Hypocalcemia # Hyperphosphatemia # Renal osteodystrophy d/t secondary hyperparathyroidism Previous work-up for hypocalcemia demonstrated elevated PTH, low calcium and elevated alk phos and bony lesions.  Multiple myeloma work-up was unremarkable.  Her medications include Calcitrol and calcium vitamin D supplement however was not taking these at home.  She was given 1 dose of 2 g calcium gluconate and resumed home medications with addition of TUMS. Corrected calcium remains 7.1 today. Phosphorus 5.3. Patient had severe vitamin D-25OH deficiency in the past.  -Continue home medications calcitriol, calcium-vitamin D supplement -Check vitamin D-25OH level; replace if indicated -Continue to monitor daily  #Hypertension Blood pressures have improved, now mildly elevated.  - Cardiology recommend continuing to hold Imdur, hydralazine, waiting on Toprol  - Will likely add Toprol if pressures remain stable   #Type II diabetes mellitus Hemoglobin A1c 5.6. CBG's stable without insulin past 24 hours.  - Continue to  monitor CBG's - SSI  #Paroxysmal atrial fibrillation CHA2DS2-VASC 4. Patient has history of atrial fibrillation rate controlled with metoprolol. Given her recent low blood pressures, will hold off re-starting her home metoprolol. Can consider amiodarone for rate control if she converts to Afib. Currently hemodynamically stable in normal sinus rhythm. Continue with home eliquis. - Hold home metoprolol - C/w home eliquis  Dispo: Anticipated discharge pending clinical improvement.   Code Status: Full Code  Jeralyn Bennett, MD 07/07/2020, 2:28 PM Pager: [phone] After 5pm on weekdays and 1pm on weekends: On Call pager [phone]

## 2020-07-07 NOTE — TOC Progression Note (Addendum)
Transition of Care [facility]) - Progression Note    Patient Details  Name: [name] MRN: [id number] Date of Birth: September 19, 1965  [facility]) CM/SW Contact  Zenon Mayo, RN Phone Number: 07/07/2020, 3:16 PM  Clinical Narrative:    NCM offered choice to patient, which was between [place] and [facility],  She chose [place], [place] called [place] and spoke to [place] the administrator she states Carolyne Fiscal is now the CSW and I would need to talk  [facility] [place] at the same number.  [place] will inform [facility] who is there this weekend that this patient has chosen their facility and possibly may be ready by next week.     Expected Discharge Plan: [place] Barriers to Discharge: Continued Medical Work up  Expected Discharge Plan and Services Expected Discharge Plan: [place] arrangements for the past 2 months: Apartment                                       Social Determinants of Health (SDOH) Interventions    Readmission Risk Interventions No flowsheet data found.

## 2020-07-08 ENCOUNTER — Inpatient Hospital Stay (HOSPITAL_COMMUNITY): Payer: Medicare Other

## 2020-07-08 DIAGNOSIS — E559 Vitamin D deficiency, unspecified: Secondary | ICD-10-CM

## 2020-07-08 DIAGNOSIS — I5023 Acute on chronic systolic (congestive) heart failure: Secondary | ICD-10-CM | POA: Diagnosis not present

## 2020-07-08 DIAGNOSIS — I5043 Acute on chronic combined systolic (congestive) and diastolic (congestive) heart failure: Secondary | ICD-10-CM | POA: Diagnosis not present

## 2020-07-08 LAB — GLUCOSE, CAPILLARY
Glucose-Capillary: 110 mg/dL — ABNORMAL HIGH (ref 70–99)
Glucose-Capillary: 103 mg/dL — ABNORMAL HIGH (ref 70–99)
Glucose-Capillary: 152 mg/dL — ABNORMAL HIGH (ref 70–99)
Glucose-Capillary: 131 mg/dL — ABNORMAL HIGH (ref 70–99)

## 2020-07-08 LAB — URINE CULTURE

## 2020-07-08 LAB — COOXEMETRY PANEL
Carboxyhemoglobin: 1.4 % (ref 0.5–1.5)
Carboxyhemoglobin: 1.3 % (ref 0.5–1.5)
Methemoglobin: 1 % (ref 0.0–1.5)
Methemoglobin: 1 % (ref 0.0–1.5)
O2 Saturation: 94.6 %
O2 Saturation: 90.1 %
Total hemoglobin: 7.5 g/dL — ABNORMAL LOW (ref 12.0–16.0)
Total hemoglobin: 7 g/dL — ABNORMAL LOW (ref 12.0–16.0)

## 2020-07-08 LAB — RENAL FUNCTION PANEL
Albumin: 2.7 g/dL — ABNORMAL LOW (ref 3.5–5.0)
Anion gap: 12 (ref 5–15)
BUN: 43 mg/dL — ABNORMAL HIGH (ref 6–20)
CO2: 20 mmol/L — ABNORMAL LOW (ref 22–32)
Calcium: 6.1 mg/dL — CL (ref 8.9–10.3)
Chloride: 110 mmol/L (ref 98–111)
Creatinine, Ser: 3.1 mg/dL — ABNORMAL HIGH (ref 0.44–1.00)
GFR, Estimated: 17 mL/min — ABNORMAL LOW (ref >60)
Glucose, Bld: 121 mg/dL — ABNORMAL HIGH (ref 70–99)
Phosphorus: 5.2 mg/dL — ABNORMAL HIGH (ref 2.5–4.6)
Potassium: 3.5 mmol/L (ref 3.5–5.1)
Sodium: 142 mmol/L (ref 135–145)

## 2020-07-08 LAB — OCCULT BLOOD X 1 CARD TO LAB, STOOL: Fecal Occult Bld: NEGATIVE

## 2020-07-08 LAB — CBC
HCT: 24.6 % — ABNORMAL LOW (ref 36.0–46.0)
Hemoglobin: 7.7 g/dL — ABNORMAL LOW (ref 12.0–15.0)
MCH: 30.1 pg (ref 26.0–34.0)
MCHC: 31.3 g/dL (ref 30.0–36.0)
MCV: 96.1 fL (ref 80.0–100.0)
Platelets: 161 10*3/uL (ref 150–400)
RBC: 2.56 MIL/uL — ABNORMAL LOW (ref 3.87–5.11)
RDW: 18 % — ABNORMAL HIGH (ref 11.5–15.5)
WBC: 6.8 10*3/uL (ref 4.0–10.5)
nRBC: 0 % (ref 0.0–0.2)

## 2020-07-08 LAB — PREPARE RBC (CROSSMATCH)

## 2020-07-08 LAB — HEMOGLOBIN AND HEMATOCRIT, BLOOD
HCT: 24.8 % — ABNORMAL LOW (ref 36.0–46.0)
Hemoglobin: 7.3 g/dL — ABNORMAL LOW (ref 12.0–15.0)

## 2020-07-08 LAB — MAGNESIUM: Magnesium: 1.9 mg/dL (ref 1.7–2.4)

## 2020-07-08 MED ORDER — MAGNESIUM OXIDE 400 (241.3 MG) MG PO TABS
800.0000 mg | ORAL_TABLET | Freq: Every day | ORAL | Status: DC
Start: 2020-07-08 — End: 2020-07-11
  Administered 2020-07-09 – 2020-07-11 (×3): 800 mg via ORAL
  Filled 2020-07-08 (×3): qty 2

## 2020-07-08 MED ORDER — ISOSORB DINITRATE-HYDRALAZINE 20-37.5 MG PO TABS
0.5000 | ORAL_TABLET | Freq: Three times a day (TID) | ORAL | Status: DC
  Administered 2020-07-08 – 2020-07-09 (×3): 0.5 via ORAL
  Filled 2020-07-08 (×3): qty 1

## 2020-07-08 MED ORDER — FUROSEMIDE 10 MG/ML IJ SOLN
20.0000 mg/h | INTRAVENOUS | Status: DC
  Administered 2020-07-08 – 2020-07-09 (×3): 15 mg/h via INTRAVENOUS
  Administered 2020-07-10: 20 mg/h via INTRAVENOUS
  Administered 2020-07-10: 15 mg/h via INTRAVENOUS
  Administered 2020-07-11 – 2020-07-13 (×6): 20 mg/h via INTRAVENOUS
  Filled 2020-07-08 (×14): qty 20

## 2020-07-08 MED ORDER — VITAMIN D (ERGOCALCIFEROL) 1.25 MG (50000 UNIT) PO CAPS
50000.0000 [IU] | ORAL_CAPSULE | ORAL | Status: DC
  Administered 2020-07-08 – 2020-07-15 (×2): 50000 [IU] via ORAL
  Filled 2020-07-08 (×5): qty 1

## 2020-07-08 MED ORDER — SODIUM CHLORIDE 0.9% IV SOLUTION: Freq: Once | INTRAVENOUS | Status: DC

## 2020-07-08 MED ORDER — APIXABAN 5 MG PO TABS
5.0000 mg | ORAL_TABLET | Freq: Two times a day (BID) | ORAL | Status: DC
  Administered 2020-07-08 – 2020-07-20 (×25): 5 mg via ORAL
  Filled 2020-07-08 (×25): qty 1

## 2020-07-08 MED ORDER — FUROSEMIDE 10 MG/ML IJ SOLN
80.0000 mg | Freq: Once | INTRAMUSCULAR | Status: AC
  Administered 2020-07-08: 80 mg via INTRAVENOUS
  Filled 2020-07-08: qty 8

## 2020-07-08 MED ORDER — SODIUM CHLORIDE 0.9 % IV SOLN
20.0000 ug | Freq: Once | INTRAVENOUS | Status: AC
  Administered 2020-07-08: 20 ug via INTRAVENOUS
  Filled 2020-07-08: qty 5

## 2020-07-08 MED ORDER — POTASSIUM CHLORIDE CRYS ER 20 MEQ PO TBCR
40.0000 meq | EXTENDED_RELEASE_TABLET | Freq: Once | ORAL | Status: AC
  Administered 2020-07-08: 40 meq via ORAL
  Filled 2020-07-08: qty 2

## 2020-07-08 NOTE — Progress Notes (Signed)
Patient ID: [name], female   DOB: [birth year], 55 y.o.   MRN: [id number]     Advanced Heart Failure Rounding Note  PCP-Cardiologist: No primary care provider on file.   Subjective:    Sluggish diuresis with Lasix 120 mg IV bid despite starting milrinone 0.125.  CVL just placed this morning.  Creatinine stable at 3.1. SBP 150s.   Lab Results  Component Value Date   CREATININE 3.10 (H) 07/08/2020   CREATININE 3.14 (H) 07/07/2020   CREATININE 2.98 (H) 07/06/2020    Objective:   Weight Range: (!) 150.2 kg Body mass index is 46.18 kg/m.   Vital Signs:   Temp:  [97.6 F (36.4 C)-98.4 F (36.9 C)] 97.9 F (36.6 C) (01/01 1144) Pulse Rate:  [72-77] 75 (01/01 1144) Resp:  [9-18] 9 (01/01 1144) BP: (139-161)/(60-84) 153/76 (01/01 1144) SpO2:  [96 %-100 %] 96 % (01/01 1144) FiO2 (%):  [96 %] 96 % (01/01 1144) Weight:  [148.3 kg-150.2 kg] 150.2 kg (01/01 0006) Last BM Date: 07/07/20  Weight change: Filed Weights   07/07/20 0430 07/07/20 1521 07/08/20 0006  Weight: (!) 149.2 kg (!) 148.3 kg (!) 150.2 kg    Intake/Output:   Intake/Output Summary (Last 24 hours) at 07/08/2020 1204 Last data filed at 07/08/2020 0842 Gross per 24 hour  Intake 1890 ml  Output 1850 ml  Net 40 ml      Physical Exam   General: NAD Neck: JVP 14-16 cm, no thyromegaly or thyroid nodule.  Lungs: Decreased at bases CV: Nonpalpable PMI.  Heart regular S1/S2, no S3/S4, no murmur.  2+ edema to knees.   Abdomen: Soft, nontender, no hepatosplenomegaly, no distention.  Skin: Intact without lesions or rashes.  Neurologic: Alert and oriented x 3.  Psych: Normal affect. Extremities: No clubbing or cyanosis.  HEENT: Normal.    Telemetry   NSR 80s (personally reviewed)  EKG    N/a   Labs    CBC Recent Labs    07/07/20 0254 07/08/20 0031 07/08/20 0500  WBC 5.4 6.8  --   HGB 7.6* 7.7* 7.3*  HCT 25.0* 24.6* 24.8*  MCV 96.5 96.1  --   PLT 198 161  --    Basic Metabolic Panel Recent  Labs    07/07/20 0254 07/08/20 0031  NA 141 142  K 3.6 3.5  CL 110 110  CO2 19* 20*  GLUCOSE 92 121*  BUN 41* 43*  CREATININE 3.14* 3.10*  CALCIUM 6.1* 6.1*  MG 1.9 1.9  PHOS 5.0* 5.2*   Liver Function Tests Recent Labs    07/07/20 0254 07/08/20 0031  ALBUMIN 2.7* 2.7*   No results for input(s): LIPASE, AMYLASE in the last 72 hours. Cardiac Enzymes No results for input(s): CKTOTAL, CKMB, CKMBINDEX, TROPONINI in the last 72 hours.  BNP: BNP (last 3 results) Recent Labs    03/14/20 0920 07/04/20 1813  BNP >4,500.0* 7,619.5*    ProBNP (last 3 results) No results for input(s): PROBNP in the last 8760 hours.   D-Dimer No results for input(s): DDIMER in the last 72 hours. Hemoglobin A1C No results for input(s): HGBA1C in the last 72 hours. Fasting Lipid Panel No results for input(s): CHOL, HDL, LDLCALC, TRIG, CHOLHDL, LDLDIRECT in the last 72 hours. Thyroid Function Tests No results for input(s): TSH, T4TOTAL, T3FREE, THYROIDAB in the last 72 hours.  Invalid input(s): FREET3  Other results:   Imaging    No results found.   Medications:     Scheduled Medications: .  sodium chloride   Intravenous Once  . apixaban  5 mg Oral BID  . atorvastatin  40 mg Oral Daily  . calcitRIOL  0.5 mcg Oral Daily  . calcium carbonate  1,000 mg Oral Daily  . calcium-vitamin D  1 tablet Oral BID  . Chlorhexidine Gluconate Cloth  6 each Topical Daily  . DULoxetine  30 mg Oral Daily  . ferrous sulfate  325 mg Oral BID WC  . furosemide  80 mg Intravenous Once  . insulin aspart  0-5 Units Subcutaneous QHS  . insulin aspart  0-6 Units Subcutaneous TID WC  . isosorbide-hydrALAZINE  0.5 tablet Oral TID  . mycophenolate  540 mg Oral BID  . pantoprazole  40 mg Oral Daily  . potassium chloride  40 mEq Oral Once  . predniSONE  2.5 mg Oral Q breakfast  . sodium chloride flush  3 mL Intravenous Q12H  . tacrolimus  3 mg Oral BID  . vitamin B-12  1,000 mcg Oral Daily  . Vitamin  D (Ergocalciferol)  50,000 Units Oral Q Sat    Infusions: . sodium chloride    . furosemide (LASIX) 200 mg in dextrose 5% 100 mL (2mg /mL) infusion    . milrinone 0.125 mcg/kg/min (07/07/20 2306)    PRN Medications: sodium chloride, acetaminophen, ondansetron (ZOFRAN) IV, sodium chloride flush    Assessment/Plan    1. Acute on chronic systolic CHF: Echo this admission with EF 25-30% with mild LV dilation, severe LVH, moderately decreased RV function/mild RV enlargement, moderate aortic insufficiency. Cause for cardiomyopathy is uncertain.  She had Cardiolite in 9/21 showing inferior and anterior fixed defects with minimal ischemia, possible ischemic cardiomyopathy.  Not cathed given no chest pain and CKD stage IV. Of note, sister had a recent heart transplant (?familial cardiomyopathy).  On exam, she is still volume overloaded, she did not diurese well.  - CVL placed, will send co-ox and check CVP.  - Continue milrinone 0.125 for now, if co-ox low will increase.  - Unna boots - With poor diuresis will give Lasix 80 mg IV x 1 today then start Lasix gtt 15 mg/hr.  - BP high, start Bidil 1/2 tab tid.  - With CKD stage IV, no ARB/ARNI/spironolactone/digoxin.  - Can restart her home Toprol XL 25 mg daily when volume better controlled.  2. CKD stage IV: History of renal transplant at [facility].  - Continue prednisone and tacrolimus.  - Follow creatinine closely with diuresis.  3. CAD: Extensive CAD noted on prior chest CT.  She had Cardiolite in 9/21 showing inferior and anterior fixed defects with minimal ischemia, possible ischemic cardiomyopathy.  Not cathed given no chest pain and CKD stage IV. - Continue statin.  - No ASA given apixaban use.  4. Atrial fibrillation: Paroxysmal.  She is in NSR.  - Continue apixaban.  5. H/o DVT: On apixaban.  6. Type 2 DM.  7. Anemia: Hgb 7.7 today, ?due to chronic disease/renal disease.  FOBT was negative.   Loralie Champagne 07/08/2020 12:04 PM

## 2020-07-08 NOTE — Progress Notes (Signed)
S/p central line placement.   1218 noted different nurse in room evaluating L neck central line.   Insertion site noted with raised, soft area with bandage saturated with blood.   Held pressure x 4 totalling 30 mins.  Site continues to leak.  Light ressure dressing applied at 1315.  Nurse at bedside monitoring continues x 30 mins.   Neck and surrounding area soft, tender to palpation w/o bleeding at this tiime.   Rapid Respones aware, who notified MD.  New orders placed [facility]

## 2020-07-08 NOTE — TOC Progression Note (Signed)
Transition of Care [facility]) - Progression Note    Patient Details  Name: [name] MRN: [id number] Date of Birth: 1966-06-01  Transition of Care [facility]) CM/SW [place], [place] Phone Number: [phone] 07/08/2020, 9:53 AM  Clinical Narrative:     CSW spoke with Nicole Kindred from [facility] to confirm the bed offer and has stated that they should have a bed for patient.  TOC team will continue to assist with discharge planning needs.  Expected Discharge Plan: [place] Barriers to Discharge: Continued Medical Work up  Expected Discharge Plan and Services Expected Discharge Plan: [place] arrangements for the past 2 months: Apartment                                       Social Determinants of Health (SDOH) Interventions    Readmission Risk Interventions No flowsheet data found.

## 2020-07-08 NOTE — Procedures (Signed)
Central Venous Catheter Insertion Procedure Note  [name]  [id number]  [birth year]  Date:07/08/20  Time:1:27 PM   Provider Performing:Geralene Afshar Cipriano [place]   Procedure: Insertion of Non-tunneled Central Venous [phone]) with US guidance (19914)   Indication(s) Difficult access  Consent Risks of the procedure as well as the alternatives and risks of each were explained to the patient and/or caregiver.  Consent for the procedure was obtained and is signed in the bedside chart  Anesthesia Topical only with 1% lidocaine   Timeout Verified patient identification, verified procedure, site/side was marked, verified correct patient position, special equipment/implants available, medications/allergies/relevant history reviewed, required imaging and test results available.  Sterile Technique Maximal sterile technique including full sterile barrier drape, hand hygiene, sterile gown, sterile gloves, mask, hair covering, sterile ultrasound probe cover (if used).  Procedure Description Area of catheter insertion was cleaned with chlorhexidine and draped in sterile fashion.  With real-time ultrasound guidance a central venous catheter was placed into the left internal jugular vein. Nonpulsatile blood flow and easy flushing noted in all ports.  The catheter was sutured in place and sterile dressing applied.  Complications/Tolerance None; patient tolerated the procedure well. Chest X-ray is ordered to verify placement for internal jugular or subclavian cannulation.   Chest x-ray is not ordered for femoral cannulation.  EBL Minimal  Specimen(s) None

## 2020-07-08 NOTE — Progress Notes (Signed)
Subjective:   Overnight, [name]'s hemoglobin dropped, and she was okay with receiving a second blood transfusion this morning. She was only able to receive 2/3 of her first transfusion as her peripheral IV infiltrated. Continues to deny any bleeding. Held off on second unit due to her fluid status.   Denies chest pain or palpitations. She feels lightheaded. She states her feet feel "neutral" and leg swelling feels somewhat better. She is not sure if she had a BM since yesterday. She reports a history of dark stools. No blood in her urine that she has noticed. No other bleeding anywhere that she has noticed. Denies nausea, abdominal pain or nausea.   Objective:  Vital signs in last 24 hours: Vitals:   07/07/20 1947 07/08/20 0000 07/08/20 0006 07/08/20 0400  BP: (!) 156/76 (!) 144/75  (!) 143/69  Pulse: 72   73  Resp: 12 14  (!) 9  Temp: 98.4 F (36.9 C) 98.3 F (36.8 C)  98.4 F (36.9 C)  TempSrc: Oral Oral  Oral  SpO2: 100% 100%  100%  Weight:   (!) 150.2 kg   Height:       General- Morbidly obese. Resting comfortably in no acute distress. Cardio- RRR, no murmurs, rubs or gallops. Bilateral 2+ LE edema and RUE pitting edema Pulm- Minimally increased work of breathing without tachypnea, saturating 100% on room air. Expiratory wheezing present throughout all lung fields.  Skin- Chronic diffuse skin induration on abdomen and extremities. Right extremity cool to the touch. Neuro- alert and oriented x3  Assessment/Plan:  Principal Problem:   Acute on chronic heart failure (HCC) Active Problems:   S/p cadaver renal transplant   Paroxysmal atrial fibrillation (HCC)   Chronic kidney disease (CKD), stage IV (severe) (HCC)  [name] is 55yo female with HFrEF (Echo 03/2020: EF 25-30%), paroxysmal atrial fibrillation, CKD IV s/p renal transplant on chronic immunosuppression, hypertension, type II diabetes mellitus, hx DVT of R axillary vein on Eliquis admitted 12/28 for  acute on chronic heart failure.  #Acute on chronic combined CHF  #Acute respiratory distress without hypoxia Recent new diagnosis of HFrEF, presumed to be due to HTN vs ischemic cardiomyopathy.  Saturating 100% on room air with improvement of shortness of breath.  Unfortunately overnight patient lost IV access and PCCM was consulted for central line placement; avoiding PICC line placement due to kidney function and possible need for hemodialysis in the future.  She is now status post left IJ central venous catheter. Coax panel is indicative of an unsteady state, will repeat. Continuing to diurese. - Heart failure following, appreciate their recommendations - Per cards, Lasix 80 mg IV today and then to start Lasix gtt 49m/h - Continue milrinone 0.1273m - Started BiDil today - Plan to restart Toprol 25 mg daily when volume better controlled - Daily weights - Strict I&O's - Continuous telemetry   #AKI on CKD IV, Stable  #s/p renal transplant in 2011 on chronic immunosuppression Cr remains stable at 3.1.  Continuing to monitor.  Urine culture demonstrated multiple species present suggestive of her recollection.  At this time no signs or symptoms of infection, will repeat urine culture if there is concern. - Foley catheter placed for retention and strict I&O - Tacrolimus level pending - F/u urine culture, GI panel   - C/w mycophenolate, tacrolimus, prednisone - Strict I&O's - Avoid nephrotoxic medications - Trend renal function  # Normocytic Anemia, Unstable   All cell counts have decreased since admission. Hgb  down to 7.6 today without any obvious source of bleeding. Checking FOBT. Goal Hgb >7. Will monitor and transfuse prn, with careful consideration given patient's volume status. - S/p 1 unit pRBC - Trend CBC - Repeat transfusion if Hgb < 8.0  #Hypokalemia, Resolved #Hypomagnesemia, Persistent  Potassium remains stable. Magnesium remains low at 1.9 despite replacement. Goal Mg > 2.  Likely in the setting of hypocalcemia, this will be difficult to correct. Continuing to replenish calcium and vitamin D.   - Trend renal function panel and mag - Resume magnesium oxide   # Hypocalcemia, Persistent # Hyperphosphatemia # Severe Vitamin D deficiency, Chronic # Renal osteodystrophy d/t secondary hyperparathyroidism Previous work-up for hypocalcemia demonstrated elevated PTH, low calcium and elevated alk phos and bony lesions.  Multiple myeloma work-up was unremarkable.  Her medications include Calcitrol and calcium vitamin D supplement however was not taking these at home.  She was given 1 dose of 2 g calcium gluconate and resumed home medications with addition of TUMS. Corrected calcium remains 7.1 today. Phosphorus stably elevated. Vitamin D low at 9.6 this admission.  -Continue home medications calcitriol, calcium-vitamin D supplement -Start Vitamin D 50,000 units weekly (x 6 weeks)  -Continue to monitor daily  #Hypertension Blood pressures now elevated.  - Restarted bidil today  - Plan to resume toprol when volume status improves  #Type II diabetes mellitus - CBG monitoring - SSI  #Paroxysmal atrial fibrillation CHA2DS2-VASC 4. Patient has history of atrial fibrillation rate controlled. In NSR, off home metoprolol, due to recent low pressures; however given pressures improved, will restart metoprolol when volume status improves. - Hold toprol  - Continue home Eliquis 44m twice daily for now   Dispo: Anticipated discharge pending clinical improvement.   DVT PPx: SCD's currently due to bleeding Code Status: Full Code IVF: None aside from transfusion  Code Status: Full Code  SJeralyn Bennett MD 07/08/2020, 6:50 AM Pager: [phone] 5pm on weekdays and 1pm on weekends: On Call pager [phone]

## 2020-07-09 DIAGNOSIS — N184 Chronic kidney disease, stage 4 (severe): Secondary | ICD-10-CM | POA: Diagnosis not present

## 2020-07-09 DIAGNOSIS — I5043 Acute on chronic combined systolic (congestive) and diastolic (congestive) heart failure: Secondary | ICD-10-CM | POA: Diagnosis not present

## 2020-07-09 LAB — RENAL FUNCTION PANEL
Albumin: 2.5 g/dL — ABNORMAL LOW (ref 3.5–5.0)
Anion gap: 11 (ref 5–15)
BUN: 43 mg/dL — ABNORMAL HIGH (ref 6–20)
CO2: 20 mmol/L — ABNORMAL LOW (ref 22–32)
Calcium: 6.2 mg/dL — CL (ref 8.9–10.3)
Chloride: 112 mmol/L — ABNORMAL HIGH (ref 98–111)
Creatinine, Ser: 3.05 mg/dL — ABNORMAL HIGH (ref 0.44–1.00)
GFR, Estimated: 17 mL/min — ABNORMAL LOW (ref >60)
Glucose, Bld: 97 mg/dL (ref 70–99)
Phosphorus: 5 mg/dL — ABNORMAL HIGH (ref 2.5–4.6)
Potassium: 3.3 mmol/L — ABNORMAL LOW (ref 3.5–5.1)
Sodium: 143 mmol/L (ref 135–145)

## 2020-07-09 LAB — HEMOGLOBIN AND HEMATOCRIT, BLOOD
HCT: 25.5 % — ABNORMAL LOW (ref 36.0–46.0)
Hemoglobin: 7.6 g/dL — ABNORMAL LOW (ref 12.0–15.0)

## 2020-07-09 LAB — COOXEMETRY PANEL
Carboxyhemoglobin: 1.5 % (ref 0.5–1.5)
Carboxyhemoglobin: 1.5 % (ref 0.5–1.5)
Methemoglobin: 1.1 % (ref 0.0–1.5)
Methemoglobin: 0.9 % (ref 0.0–1.5)
O2 Saturation: 85.1 %
O2 Saturation: 78.5 %
Total hemoglobin: 7.1 g/dL — ABNORMAL LOW (ref 12.0–16.0)
Total hemoglobin: 7.5 g/dL — ABNORMAL LOW (ref 12.0–16.0)

## 2020-07-09 LAB — GLUCOSE, CAPILLARY
Glucose-Capillary: 104 mg/dL — ABNORMAL HIGH (ref 70–99)
Glucose-Capillary: 103 mg/dL — ABNORMAL HIGH (ref 70–99)
Glucose-Capillary: 126 mg/dL — ABNORMAL HIGH (ref 70–99)
Glucose-Capillary: 126 mg/dL — ABNORMAL HIGH (ref 70–99)
Glucose-Capillary: 118 mg/dL — ABNORMAL HIGH (ref 70–99)

## 2020-07-09 LAB — CBC
HCT: 23.2 % — ABNORMAL LOW (ref 36.0–46.0)
Hemoglobin: 6.8 g/dL — CL (ref 12.0–15.0)
MCH: 28.7 pg (ref 26.0–34.0)
MCHC: 29.3 g/dL — ABNORMAL LOW (ref 30.0–36.0)
MCV: 97.9 fL (ref 80.0–100.0)
Platelets: 170 10*3/uL (ref 150–400)
RBC: 2.37 MIL/uL — ABNORMAL LOW (ref 3.87–5.11)
RDW: 17.7 % — ABNORMAL HIGH (ref 11.5–15.5)
WBC: 6.2 10*3/uL (ref 4.0–10.5)
nRBC: 0 % (ref 0.0–0.2)

## 2020-07-09 LAB — MAGNESIUM: Magnesium: 1.8 mg/dL (ref 1.7–2.4)

## 2020-07-09 LAB — PREPARE RBC (CROSSMATCH)

## 2020-07-09 MED ORDER — METOLAZONE 5 MG PO TABS
5.0000 mg | ORAL_TABLET | Freq: Once | ORAL | Status: AC
  Administered 2020-07-09: 5 mg via ORAL
  Filled 2020-07-09: qty 1

## 2020-07-09 MED ORDER — ISOSORB DINITRATE-HYDRALAZINE 20-37.5 MG PO TABS
1.0000 | ORAL_TABLET | Freq: Three times a day (TID) | ORAL | Status: DC
  Administered 2020-07-09 – 2020-07-10 (×3): 1 via ORAL
  Filled 2020-07-09 (×3): qty 1

## 2020-07-09 MED ORDER — POTASSIUM CHLORIDE CRYS ER 20 MEQ PO TBCR
40.0000 meq | EXTENDED_RELEASE_TABLET | Freq: Once | ORAL | Status: AC
  Administered 2020-07-09: 40 meq via ORAL
  Filled 2020-07-09: qty 2

## 2020-07-09 MED ORDER — POTASSIUM CHLORIDE CRYS ER 20 MEQ PO TBCR
40.0000 meq | EXTENDED_RELEASE_TABLET | Freq: Every day | ORAL | Status: DC
  Administered 2020-07-10 (×2): 40 meq via ORAL
  Filled 2020-07-09: qty 2

## 2020-07-09 MED ORDER — SODIUM CHLORIDE 0.9% IV SOLUTION: Freq: Once | INTRAVENOUS | Status: AC

## 2020-07-09 NOTE — Progress Notes (Signed)
Current vitals are as follows:   07/09/20 0945  PACU Vital Signs  Pulse Rate 84  ECG Heart Rate 84  Pulse Rate Source Monitor  Resp 18  BP (!) 164/80  MAP (mmHg) 100  BP Location Right Arm  BP Method Automatic  Patient Position (if appropriate) Lying  Invasive Hemodynamic Monitoring  CVP (mmHg) 18 mmHg  Oxygen Therapy  SpO2 100 %  O2 Device Nasal Cannula  O2 Flow Rate (L/min) 4.5 L/min   Patient alert and oriented X4, denies chest pain, shortness of breath or dizziness.  Per CCMD, patient had 10 beat run of V-tach. Internal medicine services- Dr. Thereasa Distance, aware.

## 2020-07-09 NOTE — Progress Notes (Addendum)
CRITICAL VALUE ALERT  Critical Value: Hemoglobin 6.8  Date & Time Notied:  07/10/2019 0845  Provider Notified: Internal Medicine Services- Dr. Laural Golden  Orders Received/Actions taken: See New orders.

## 2020-07-09 NOTE — Progress Notes (Signed)
Per CCMD, patient had a 16 beat run of SVT. Current vitals are as follows:   07/09/20 1426  PACU Vital Signs  Pulse Rate 89  ECG Heart Rate 83  Resp 18  BP (!) 156/62  MAP (mmHg) 86  BP Location Right Arm  BP Method Automatic  Patient Position (if appropriate) Lying  Invasive Hemodynamic Monitoring  CVP (mmHg) 19 mmHg  Oxygen Therapy  SpO2 100 %  O2 Device Nasal Cannula  O2 Flow Rate (L/min) 4.5 L/min   Patient denies shortness of breath, chest pain, or dizziness. Patient is lying comfortably in bed with eyes closed. Internal medicine services- Dr. Laural Golden aware.

## 2020-07-09 NOTE — Progress Notes (Signed)
Patient ID: [name], female   DOB: Aug 26, 1965, 55 y.o.   MRN: [id number]     Advanced Heart Failure Rounding Note  PCP-Cardiologist: No primary care provider on file.   Subjective:    Diuresis better, weight down.  Creatinine lower at 3.05.  Co-ox 78% on milrinone 0.125.  CVP still 20.   Lab Results  Component Value Date   CREATININE 3.05 (H) 07/09/2020   CREATININE 3.10 (H) 07/08/2020   CREATININE 3.14 (H) 07/07/2020    Objective:   Weight Range: (!) 148.5 kg Body mass index is 45.66 kg/m.   Vital Signs:   Temp:  [97.8 F (36.6 C)-98.6 F (37 C)] 98.3 F (36.8 C) (01/02 1049) Pulse Rate:  [75-84] 78 (01/02 1049) Resp:  [9-18] 15 (01/02 1049) BP: (140-164)/(64-88) 157/78 (01/02 1049) SpO2:  [96 %-100 %] 97 % (01/02 1049) FiO2 (%):  [96 %] 96 % (01/01 1144) Weight:  [148.5 kg] 148.5 kg (01/02 0600) Last BM Date: 07/07/20  Weight change: Filed Weights   07/07/20 1521 07/08/20 0006 07/09/20 0600  Weight: (!) 148.3 kg (!) 150.2 kg (!) 148.5 kg    Intake/Output:   Intake/Output Summary (Last 24 hours) at 07/09/2020 1100 Last data filed at 07/09/2020 0911 Gross per 24 hour  Intake 840 ml  Output 3250 ml  Net -2410 ml      Physical Exam   General: NAD Neck: Thick, JVP difficult but appears elevated, no thyromegaly or thyroid nodule.  Lungs: Decreased at bases.  CV: Nonpalpable PMI.  Heart regular S1/S2, no S3/S4, no murmur.  2+ edema to knees.  Abdomen: Soft, nontender, no hepatosplenomegaly, no distention.  Skin: Intact without lesions or rashes.  Neurologic: Alert and oriented x 3.  Psych: Normal affect. Extremities: No clubbing or cyanosis.  HEENT: Normal.    Telemetry   NSR 80s (personally reviewed)  EKG    N/a   Labs    CBC Recent Labs    07/08/20 0031 07/08/20 0500 07/09/20 0648  WBC 6.8  --  6.2  HGB 7.7* 7.3* 6.8*  HCT 24.6* 24.8* 23.2*  MCV 96.1  --  97.9  PLT 161  --  409   Basic Metabolic Panel Recent Labs     07/08/20 0031 07/09/20 0405  NA 142 143  K 3.5 3.3*  CL 110 112*  CO2 20* 20*  GLUCOSE 121* 97  BUN 43* 43*  CREATININE 3.10* 3.05*  CALCIUM 6.1* 6.2*  MG 1.9 1.8  PHOS 5.2* 5.0*   Liver Function Tests Recent Labs    07/08/20 0031 07/09/20 0405  ALBUMIN 2.7* 2.5*   No results for input(s): LIPASE, AMYLASE in the last 72 hours. Cardiac Enzymes No results for input(s): CKTOTAL, CKMB, CKMBINDEX, TROPONINI in the last 72 hours.  BNP: BNP (last 3 results) Recent Labs    03/14/20 0920 07/04/20 1813  BNP >4,500.0* 8,119.1*    ProBNP (last 3 results) No results for input(s): PROBNP in the last 8760 hours.   D-Dimer No results for input(s): DDIMER in the last 72 hours. Hemoglobin A1C No results for input(s): HGBA1C in the last 72 hours. Fasting Lipid Panel No results for input(s): CHOL, HDL, LDLCALC, TRIG, CHOLHDL, LDLDIRECT in the last 72 hours. Thyroid Function Tests No results for input(s): TSH, T4TOTAL, T3FREE, THYROIDAB in the last 72 hours.  Invalid input(s): FREET3  Other results:   Imaging    DG Chest Port 1 View  Result Date: 07/08/2020 CLINICAL DATA:  PICC line placement EXAM: PORTABLE  CHEST 1 VIEW COMPARISON:  July 04, 2020 FINDINGS: Left PICC line is identified with distal tip in the superior vena cava. There is no pneumothorax. The heart size is enlarged. Diffuse hazy ground-glass opacity with increased interstitium is identified in both lungs. Dense consolidation of bilateral lung bases are noted. The pulmonary findings are significantly worse compared prior exam. IMPRESSION: 1. Left PICC line distal tip in the superior vena cava. No pneumothorax. 2. Diffuse hazy ground-glass opacity with increased pulmonary interstitium and dense consolidation of bilateral lungs. These findings are significantly worse compared prior exam. Electronically Signed   By: Abelardo Diesel M.D.   On: 07/08/2020 12:53     Medications:     Scheduled Medications: . sodium  chloride   Intravenous Once  . apixaban  5 mg Oral BID  . atorvastatin  40 mg Oral Daily  . calcitRIOL  0.5 mcg Oral Daily  . calcium carbonate  1,000 mg Oral Daily  . calcium-vitamin D  1 tablet Oral BID  . Chlorhexidine Gluconate Cloth  6 each Topical Daily  . DULoxetine  30 mg Oral Daily  . ferrous sulfate  325 mg Oral BID WC  . insulin aspart  0-5 Units Subcutaneous QHS  . insulin aspart  0-6 Units Subcutaneous TID WC  . isosorbide-hydrALAZINE  1 tablet Oral TID  . magnesium oxide  800 mg Oral Daily  . metolazone  5 mg Oral Once  . mycophenolate  540 mg Oral BID  . pantoprazole  40 mg Oral Daily  . predniSONE  2.5 mg Oral Q breakfast  . sodium chloride flush  3 mL Intravenous Q12H  . tacrolimus  3 mg Oral BID  . vitamin B-12  1,000 mcg Oral Daily  . Vitamin D (Ergocalciferol)  50,000 Units Oral Q Sat    Infusions: . sodium chloride    . furosemide (LASIX) 200 mg in dextrose 5% 100 mL (2mg /mL) infusion 15 mg/hr (07/09/20 0228)  . milrinone 0.125 mcg/kg/min (07/09/20 0348)    PRN Medications: sodium chloride, acetaminophen, ondansetron (ZOFRAN) IV, sodium chloride flush    Assessment/Plan    1. Acute on chronic systolic CHF: Echo this admission with EF 25-30% with mild LV dilation, severe LVH, moderately decreased RV function/mild RV enlargement, moderate aortic insufficiency. Cause for cardiomyopathy is uncertain.  She had Cardiolite in 9/21 showing inferior and anterior fixed defects with minimal ischemia, possible ischemic cardiomyopathy.  Not cathed given no chest pain and CKD stage IV. Of note, sister had a recent heart transplant (?familial cardiomyopathy).  On exam, she is still volume overloaded with CVP 20.  She is diuresing better on Lasix gtt.  She is on milrinone 0.125 with co-ox 78%.  - Continue milrinone 0.125.   - Unna boots - Continue Lasix gtt 15 mg/hr and give metolazone 5 mg x 1.  Will give dose of KCl.  - BP high, increase Bidil to 1 tab tid.   - With  CKD stage IV, no ARB/ARNI/spironolactone/digoxin.  - Can restart her home Toprol XL 25 mg daily when volume better controlled.  2. CKD stage IV: History of renal transplant at [facility].  - Continue prednisone and tacrolimus.  - Follow creatinine closely with diuresis.  3. CAD: Extensive CAD noted on prior chest CT.  She had Cardiolite in 9/21 showing inferior and anterior fixed defects with minimal ischemia, possible ischemic cardiomyopathy.  Not cathed given no chest pain and CKD stage IV. - Continue statin.  - No ASA given apixaban use.  4. Atrial  fibrillation: Paroxysmal.  She is in NSR.  - Continue apixaban.  5. H/o DVT: On apixaban.  6. Type 2 DM.  7. Anemia: Hgb down to 6.8 today, ?due to chronic disease/renal disease.  FOBT was negative.  - Getting 1 unit PRBCs.   Loralie Champagne 07/09/2020 11:00 AM

## 2020-07-09 NOTE — Progress Notes (Addendum)
Subjective:   Patient denies any particular complaints this morning. States her breathing is improving. Unsure if her leg swelling has changed. States her central line went well, no pain at insertion site. No new complaints.  Objective:  Vital signs in last 24 hours: Vitals:   07/08/20 1516 07/08/20 2000 07/09/20 0000 07/09/20 0600  BP:  (!) 162/88 (!) 155/74   Pulse:  79    Resp:  14 14   Temp: 97.8 F (36.6 C) 98.6 F (37 C) 98.3 F (36.8 C)   TempSrc: Oral Oral Oral   SpO2:   100%   Weight:    (!) 148.5 kg  Height:       Physical Exam: General- Morbidly obese. Resting comfortably in no acute distress. Cardio- RRR, no murmurs, rubs or gallops.  Firm bilateral 2+ LE edema extending to bilateral hips and RUE pitting edema Pulm- Minimally increased work of breathing without tachypnea, saturating 100% on room air. Expiratory wheezing present throughout all lung fields.  Extremities-warmer to touch, dry Skin- Chronic diffuse skin induration on abdomen and extremities. Right extremity cool to the touch. Neuro- alert and oriented x3  Assessment/Plan:  Principal Problem:   Acute on chronic heart failure (HCC) Active Problems:   S/p cadaver renal transplant   Paroxysmal atrial fibrillation (HCC)   Chronic kidney disease (CKD), stage IV (severe) (HCC)  [name] is 55yo female with HFrEF (Echo 03/2020: EF 25-30%), paroxysmal atrial fibrillation,CKD IV s/p renal transplant on chronic immunosuppression,hypertension, type II diabetes mellitus, hx DVT of R axillary vein on Eliquis admitted 12/28 for acute on chronic heart failure.  Recent new diagnosis of HFrEF presumed to be due to hypertension versus ischemic cardiomyopathy.  Acute on chronic combined CHF  Acute respiratory distress without hypoxia Patient continues to be hypervolemic with significant firm pitting edema up to bilateral hips.  Somewhat improved urine output in the past 24 hours, net 2.1 L.  Patient had a  left IJ central line placed yesterday.  Able to monitor CVP with this central line as well as Cox panels which have consistently shown unsteady state.  Continuing to diurese aggressively, starting Lasix GTT today.  Heart failure continuing to follow, appreciate recommendations. -  Start lasix gtt 15mg /h - Metolazone 5 mg x 1 - Continue milrinone 0.125mg   - Increased BiDil to 1 tab TID  - Plan to restart Toprol 25 mg daily when volume better controlled - Daily weights - Strict I&O's - Continuous telemetry   AKI on CKD IV, Stable  s/p renal transplant in 2011 on chronic immunosuppression Cr remains stable at 3.1.  Foley catheter in place for retention and strict ins and outs. - Tacrolimus level pending - C/w mycophenolate, tacrolimus, prednisone - Avoid nephrotoxic medications - Trend renal function  Normocytic Anemia, Unstable   Hemoglobin down to 6.8 this morning, ordered another unit of PRBC.  Unclear source of bleeding.  FOBT is negative.  - Ordered 1 unit pRBC, now total of 2 units of PRBC - Follow-up H&H - Trend CBC - Repeat transfusion if Hgb < 8.0  Electrolyte disturbances K3.3, mag 1.8.  Persistently difficult to correct in the setting of hypocalcemia, secondary hyperparathyroidism and severe vitamin D deficiency which is chronic.  Continue to replenish calcium and vitamin D as well.   - Trend renal function panel and mag - Continue magnesium oxide, Calcitrol and calcium vitamin D supplement - Vitamin D 50,000 units weekly for 6 weeks  - Potassium chloride 40 mEq qd  Hypertension  Increase BiDil to 1 tablets 3 times daily due to elevated blood pressures. - Continue BiDil 1 tablet 3 times daily  - Plan to resume toprol when volume status improves  Type II diabetes mellitus - CBG monitoring - SSI  Paroxysmal atrial fibrillation - Hold toprol  - Continue home Eliquis 5mg  twice daily for now   Prior to Admission Living Arrangement: Anticipated Discharge  Location: Barriers to Discharge: Dispo: Anticipated discharge pending clinical improvement.   Mike Craze, DO 07/09/2020, 6:52 AM Pager: [phone] After 5pm on weekdays and 1pm on weekends: On Call pager [phone]

## 2020-07-10 ENCOUNTER — Telehealth: Payer: Self-pay

## 2020-07-10 LAB — COOXEMETRY PANEL
Carboxyhemoglobin: 1.7 % — ABNORMAL HIGH (ref 0.5–1.5)
Methemoglobin: 0.8 % (ref 0.0–1.5)
O2 Saturation: 79.1 %
Total hemoglobin: 7.4 g/dL — ABNORMAL LOW (ref 12.0–16.0)

## 2020-07-10 LAB — BASIC METABOLIC PANEL
Anion gap: 13 (ref 5–15)
BUN: 48 mg/dL — ABNORMAL HIGH (ref 6–20)
CO2: 22 mmol/L (ref 22–32)
Calcium: 6.6 mg/dL — ABNORMAL LOW (ref 8.9–10.3)
Chloride: 106 mmol/L (ref 98–111)
Creatinine, Ser: 3.15 mg/dL — ABNORMAL HIGH (ref 0.44–1.00)
GFR, Estimated: 17 mL/min — ABNORMAL LOW (ref >60)
Glucose, Bld: 113 mg/dL — ABNORMAL HIGH (ref 70–99)
Potassium: 3.2 mmol/L — ABNORMAL LOW (ref 3.5–5.1)
Sodium: 141 mmol/L (ref 135–145)

## 2020-07-10 LAB — BPAM RBC
Blood Product Expiration Date: 202201072359
Blood Product Expiration Date: 202201242359
ISSUE DATE / TIME: 202112311739
ISSUE DATE / TIME: 202201021027
Unit Type and Rh: 6200
Unit Type and Rh: 6200

## 2020-07-10 LAB — CBC
HCT: 23.4 % — ABNORMAL LOW (ref 36.0–46.0)
Hemoglobin: 7.4 g/dL — ABNORMAL LOW (ref 12.0–15.0)
MCH: 29.7 pg (ref 26.0–34.0)
MCHC: 31.6 g/dL (ref 30.0–36.0)
MCV: 94 fL (ref 80.0–100.0)
Platelets: 165 10*3/uL (ref 150–400)
RBC: 2.49 MIL/uL — ABNORMAL LOW (ref 3.87–5.11)
RDW: 17.8 % — ABNORMAL HIGH (ref 11.5–15.5)
WBC: 6.8 10*3/uL (ref 4.0–10.5)
nRBC: 0 % (ref 0.0–0.2)

## 2020-07-10 LAB — GLUCOSE, CAPILLARY
Glucose-Capillary: 130 mg/dL — ABNORMAL HIGH (ref 70–99)
Glucose-Capillary: 91 mg/dL (ref 70–99)
Glucose-Capillary: 109 mg/dL — ABNORMAL HIGH (ref 70–99)
Glucose-Capillary: 114 mg/dL — ABNORMAL HIGH (ref 70–99)
Glucose-Capillary: 122 mg/dL — ABNORMAL HIGH (ref 70–99)

## 2020-07-10 LAB — TYPE AND SCREEN
ABO/RH(D): A POS
Antibody Screen: NEGATIVE
Unit division: 0
Unit division: 0

## 2020-07-10 LAB — RENAL FUNCTION PANEL
Albumin: 2.7 g/dL — ABNORMAL LOW (ref 3.5–5.0)
Anion gap: 13 (ref 5–15)
BUN: 44 mg/dL — ABNORMAL HIGH (ref 6–20)
CO2: 21 mmol/L — ABNORMAL LOW (ref 22–32)
Calcium: 6.8 mg/dL — ABNORMAL LOW (ref 8.9–10.3)
Chloride: 109 mmol/L (ref 98–111)
Creatinine, Ser: 3.19 mg/dL — ABNORMAL HIGH (ref 0.44–1.00)
GFR, Estimated: 16 mL/min — ABNORMAL LOW (ref >60)
Glucose, Bld: 91 mg/dL (ref 70–99)
Phosphorus: 5.1 mg/dL — ABNORMAL HIGH (ref 2.5–4.6)
Potassium: 3.1 mmol/L — ABNORMAL LOW (ref 3.5–5.1)
Sodium: 143 mmol/L (ref 135–145)

## 2020-07-10 LAB — TACROLIMUS LEVEL: Tacrolimus (FK506) - LabCorp: 3.5 ng/mL (ref 2.0–20.0)

## 2020-07-10 LAB — MAGNESIUM: Magnesium: 2 mg/dL (ref 1.7–2.4)

## 2020-07-10 MED ORDER — GUAIFENESIN ER 600 MG PO TB12
600.0000 mg | ORAL_TABLET | Freq: Two times a day (BID) | ORAL | Status: DC
  Administered 2020-07-10 – 2020-07-20 (×21): 600 mg via ORAL
  Filled 2020-07-10 (×22): qty 1

## 2020-07-10 MED ORDER — METOLAZONE 5 MG PO TABS
5.0000 mg | ORAL_TABLET | Freq: Once | ORAL | Status: AC
  Administered 2020-07-10: 5 mg via ORAL
  Filled 2020-07-10: qty 1

## 2020-07-10 MED ORDER — POTASSIUM CHLORIDE CRYS ER 20 MEQ PO TBCR
40.0000 meq | EXTENDED_RELEASE_TABLET | Freq: Once | ORAL | Status: AC
  Administered 2020-07-10: 40 meq via ORAL
  Filled 2020-07-10: qty 2

## 2020-07-10 MED ORDER — ISOSORB DINITRATE-HYDRALAZINE 20-37.5 MG PO TABS
1.5000 | ORAL_TABLET | Freq: Three times a day (TID) | ORAL | Status: DC
  Administered 2020-07-10 – 2020-07-11 (×5): 1.5 via ORAL
  Filled 2020-07-10 (×5): qty 2

## 2020-07-10 MED ORDER — METOLAZONE 5 MG PO TABS
5.0000 mg | ORAL_TABLET | Freq: Two times a day (BID) | ORAL | Status: DC
Start: 2020-07-10 — End: 2020-07-13
  Administered 2020-07-10 – 2020-07-12 (×5): 5 mg via ORAL
  Filled 2020-07-10 (×5): qty 1

## 2020-07-10 NOTE — Telephone Encounter (Signed)
-----   Message from Candee Furbish, MD sent at 07/08/2020  1:41 PM EST ----- Regarding: Lung nodule appt BI 2/10 Hi I ordered this patient a PET to be done late Jan and BI would like to see her in clinic 2/10 if you could help facilitate.  She is currently hospitalized.  Thank you  Erskine Emery

## 2020-07-10 NOTE — Plan of Care (Signed)
  Problem: Clinical Measurements: Goal: Will remain free from infection Outcome: Progressing Goal: Diagnostic test results will improve Outcome: Progressing   

## 2020-07-10 NOTE — Progress Notes (Signed)
Orthopedic Tech Progress Note Patient Details:  [name] 12/07/1965 [id number]  Ortho Devices Type of Ortho Device: Louretta Parma boot Ortho Device/Splint Location: BLE Ortho Device/Splint Interventions: Ordered,Application   Post Interventions Patient Tolerated: Well Instructions Provided: [facility] of [place] 07/10/2020, 12:53 PM

## 2020-07-10 NOTE — Progress Notes (Signed)
   Subjective:  [name] is a 55 y.o. with PMH of HFrEF, PAF, CKD4 s/p renal transplant, T2DM, HTN, DVT on eliquis admit for acute on chronic systolic heart failure on hospital day 6  She states she feels alright, states her night was alright. Notes she is coughing productive of green-yellow sputum, unsure if it is new. Denies fevers overnight, CP, palpitations. Discussed that urine output is improving, but may still be several more days until she is euvolemic, she is agreeable.  Objective:  Vital signs in last 24 hours: Vitals:   07/09/20 1632 07/09/20 2000 07/10/20 0033 07/10/20 0300  BP: (!) 154/73 (!) 174/68 (!) 147/73 (!) 147/75  Pulse: 77 84 76 80  Resp: 17  15 16   Temp:  98.3 F (36.8 C) 98.4 F (36.9 C) 98.1 F (36.7 C)  TempSrc:  Axillary Oral Oral  SpO2: 99% 97% 98% 100%  Weight:    (!) 147.9 kg  Height:       Gen: Well-developed, well nourished, NAD HEENT: NCAT head, hearing intact, EOMI CV: RRR, S1, S2 normal, No rubs, no murmurs, no gallops Pulm: Bilateral rales up to mid thorax Abd: Soft, BS+, Distended with abdominal wall edema Extm: Unna boots in place, 3+ pitting edema up thighs and abdomen, Stable RUE edema   Assessment/Plan:  Principal Problem:   Acute on chronic heart failure (HCC) Active Problems:   S/p cadaver renal transplant   Paroxysmal atrial fibrillation (HCC)   Chronic kidney disease (CKD), stage IV (severe) (HCC)  [name] is a 55 y.o. with PMH of HFrEF, PAF, CKD4 s/p renal transplant, T2DM, HTN, DVT on eliquis admit for acute on chronic systolic heart failure  Acute hypoxic respiratory failure 2/2 pulmonary edema Acute on chronic combined systolic/diastolic heart failure Continues to have anasarca on exam. Able to diurese net 3L out yesterday with addition of metolazone with lasix gtt. Cox panel w/o significant change. K 3.1, Mag 2 - Appreciate cardiology recs: c/w milrinone 0.125 and furosemide 15mg  /hr - C/w metolazone for  today - Trend BMP, Mag - Strict I&Os, Daily Weights - Fluid restriction - Keep O2 sat >88 - Replenish K as needed >4.0  CKD4 s/p renal transplant On chronic immunosuppression. Baseline creatinine 2.5. Renal fx stable around 3 since admission, 3.14->3.1->3.05->3.19 - Trend renal fx - Avoid nephrotoxic meds when able - C/w home meds: prednisone, tacrolimus  Acute on chronic anemia of chronic disease Hgb 6.8 yesterday. Received 1 unit pRBC yesterday. No evidence of bleed while on anti-coagulation. Hgb stable overnight 7.6->7.4 - Trend cbc - Transfuse if hgb<8  PAF CHADSVASC2 score of 4. On anticoagulation with eliquis. HR 80.  - Holding beta-blocker in setting of heart failure - Telemetry - C/w eliquis  HTN BP 171/80. Should improve with diuresis - Diuresis as above  T2DM Am cbg 91 - SSI - Glucose checks  DVT prophx: eliquis Diet: CM, renal Code: Full  Prior to Admission Living Arrangement: Home Anticipated Discharge Location: SNF Barriers to Discharge: Medical tx Dispo: Anticipated discharge in approximately 4-5 day(s).   Mosetta Anis, MD 07/10/2020, 6:03 AM Pager: [phone] After 5pm on weekdays and 1pm on weekends: On Call Pager: [phone]

## 2020-07-10 NOTE — Telephone Encounter (Signed)
Patient has been scheduled for 2/10 at 10am with Dr. Valeta Harms.

## 2020-07-10 NOTE — Progress Notes (Signed)
Pt is resting comfortably and denies needs at this time. Pt refused to be turned at 1800. Call bell within reach.

## 2020-07-10 NOTE — Progress Notes (Addendum)
Patient ID: [name], female   DOB: Mar 31, 1966, 55 y.o.   MRN: [id number]     Advanced Heart Failure Rounding Note  PCP-Cardiologist: No primary care provider on file.   Subjective:    Diuresis better with UOP -5.3L but weight only down 1lbs.  Co-ox 79% on milrinone 0.125 and lasix 15 gtt.  CVP 17 with orange peel edematous extremities.    [name] reports main complaint is a productive cough, denies CP or SOB.    Lab Results  Component Value Date   CREATININE 3.19 (H) 07/10/2020   CREATININE 3.05 (H) 07/09/2020   CREATININE 3.10 (H) 07/08/2020    Objective:   Weight Range: (!) 147.9 kg Body mass index is 45.48 kg/m.   Vital Signs:   Temp:  [98.1 F (36.7 C)-98.6 F (37 C)] 98.4 F (36.9 C) (01/03 0728) Pulse Rate:  [76-89] 86 (01/03 0728) Resp:  [15-18] 16 (01/03 0728) BP: (140-179)/(62-83) 179/75 (01/03 0728) SpO2:  [95 %-100 %] 95 % (01/03 0728) Weight:  [147.9 kg] 147.9 kg (01/03 0300) Last BM Date: 07/09/20  Weight change: Filed Weights   07/08/20 0006 07/09/20 0600 07/10/20 0300  Weight: (!) 150.2 kg (!) 148.5 kg (!) 147.9 kg    Intake/Output:   Intake/Output Summary (Last 24 hours) at 07/10/2020 0926 Last data filed at 07/10/2020 0910 Gross per 24 hour  Intake 1449.38 ml  Output 5250 ml  Net -3800.62 ml      Physical Exam   CVP 17 General:  Well appearing. No resp difficulty HEENT: normal Neck: supple. + JVD.  Cor: Regular rate & rhythm. + murmurs. Lungs: Wheezing and rales noted.  Abdomen: soft, nontender, nondistended. Extremities: no cyanosis, clubbing, rash. Orange peel looking extremities w/ edema to hips.  Neuro: alert & oriented x 3,  moves all 4 extremities w/o difficulty. Affect pleasant  Telemetry   SR with rates 80s.  Personally reviewed.   EKG    N/a   Labs    CBC Recent Labs    07/09/20 0648 07/09/20 1556 07/10/20 0728  WBC 6.2  --  6.8  HGB 6.8* 7.6* 7.4*  HCT 23.2* 25.5* 23.4*  MCV 97.9  --  94.0  PLT 170   --  921   Basic Metabolic Panel Recent Labs    07/09/20 0405 07/10/20 0728  NA 143 143  K 3.3* 3.1*  CL 112* 109  CO2 20* 21*  GLUCOSE 97 91  BUN 43* 44*  CREATININE 3.05* 3.19*  CALCIUM 6.2* 6.8*  MG 1.8 2.0  PHOS 5.0* 5.1*   Liver Function Tests Recent Labs    07/09/20 0405 07/10/20 0728  ALBUMIN 2.5* 2.7*   No results for input(s): LIPASE, AMYLASE in the last 72 hours. Cardiac Enzymes No results for input(s): CKTOTAL, CKMB, CKMBINDEX, TROPONINI in the last 72 hours.  BNP: BNP (last 3 results) Recent Labs    03/14/20 0920 07/04/20 1813  BNP >4,500.0* 1,941.7*    ProBNP (last 3 results) No results for input(s): PROBNP in the last 8760 hours.   D-Dimer No results for input(s): DDIMER in the last 72 hours. Hemoglobin A1C No results for input(s): HGBA1C in the last 72 hours. Fasting Lipid Panel No results for input(s): CHOL, HDL, LDLCALC, TRIG, CHOLHDL, LDLDIRECT in the last 72 hours. Thyroid Function Tests No results for input(s): TSH, T4TOTAL, T3FREE, THYROIDAB in the last 72 hours.  Invalid input(s): FREET3  Other results:   Imaging     No results found.  Medications:     Scheduled Medications:    sodium chloride     Intravenous  Once     apixaban   5 mg  Oral  BID     atorvastatin   40 mg  Oral  Daily     calcitRIOL   0.5 mcg  Oral  Daily     calcium carbonate   1,000 mg  Oral  Daily     calcium-vitamin D   1 tablet  Oral  BID     Chlorhexidine Gluconate Cloth   6 each  Topical  Daily     DULoxetine   30 mg  Oral  Daily     ferrous sulfate   325 mg  Oral  BID WC     insulin aspart   0-5 Units  Subcutaneous  QHS     insulin aspart   0-6 Units  Subcutaneous  TID WC     isosorbide-hydrALAZINE   1 tablet  Oral  TID     magnesium oxide   800 mg  Oral  Daily     mycophenolate   540 mg  Oral  BID     pantoprazole   40 mg  Oral  Daily     potassium  chloride   40 mEq  Oral  Daily     predniSONE   2.5 mg  Oral  Q breakfast     sodium chloride flush   3 mL  Intravenous  Q12H     tacrolimus   3 mg  Oral  BID     vitamin B-12   1,000 mcg  Oral  Daily     Vitamin D (Ergocalciferol)   50,000 Units  Oral  Q Sat     Infusions:    sodium chloride        furosemide (LASIX) 200 mg in dextrose 5% 100 mL (2mg /mL) infusion  15 mg/hr (07/10/20 1962)     milrinone  0.125 mcg/kg/min (07/10/20 0026)     PRN Medications:  sodium chloride, acetaminophen, ondansetron (ZOFRAN) IV, sodium chloride flush    Assessment/Plan    1. Acute on chronic systolic CHF: Echo this admission with EF 25-30% with mild LV dilation, severe LVH, moderately decreased RV function/mild RV enlargement, moderate aortic insufficiency. Cause for cardiomyopathy is uncertain.  She had Cardiolite in 9/21 showing inferior and anterior fixed defects with minimal ischemia, possible ischemic cardiomyopathy.  Not cathed given no chest pain and CKD stage IV. Of note, sister had a recent heart transplant (? familial cardiomyopathy).  On exam, CVP 17, extremities edematous.  She is on milrinone 0.125 with co-ox 79%.  - Continue milrinone 0.125 and Lasix gtt 15 mg/hr.  Today one time dose of metolazone. Replete electrolytes PRN.  - Unna boots - BP high despite increase in Bidil to 1 tab tid yesterday.  Will increase to 1.5 tab TID.  - With CKD stage IV, no ARB/ARNI/spironolactone/digoxin.  - Can restart her home Toprol XL 25 mg daily when volume better controlled.  2. CKD stage IV: History of renal transplant at [facility]. Baseline Cr around 2.5. Today Cr 3.19 trending up. UOP -5.3L in last 24H.  - Continue prednisone and tacrolimus.  - Follow creatinine closely with diuresis.  3. CAD: Extensive CAD noted on prior chest CT.  She had Cardiolite in 9/21 showing inferior and anterior fixed defects with minimal ischemia, possible ischemic  cardiomyopathy.  Not cathed given no chest pain and CKD stage IV. - Continue statin.  -  No ASA given apixaban use.  4. Atrial fibrillation: Paroxysmal.  She is in NSR.  - Continue apixaban.  5. H/o DVT: On apixaban.  6. Type 2 DM.  -Stable on SSI.  7. Anemia: Hgb down to 7.4 today, ? due to chronic disease/renal disease.  FOBT was negative.  - s/p 1 PRBC 07/10/2019 -continue to monitor 8. Productive cough  -requiring 4.5L O2 SpO2 100%, on presentation required bipap -not on home oxygen -CXR on 1/1 opacity and edema -Consider repeat imaging tomorrow if continue requiring O2 and cough -added mucinex BID -wheeze on exam likely cardiac related to volume status, continue w/ diureses  Carlene Coria 07/10/2020 9:26 AM   Patient seen and examined with the above-signed Advanced Practice Provider and/or Housestaff. I personally reviewed laboratory data, imaging studies and relevant notes. I independently examined the patient and formulated the important aspects of the plan. I have edited the note to reflect any of my changes or salient points. I have personally discussed the plan with the patient and/or family.  She remains SOB and volume overloaded. On milrinone 0.125. Co-ox  79%. CVP 17. Creatinine remains elevated ~ 3.1. Weight essentially unchanged.   General:  Obese woman with audible wheezing HEENT: normal Neck: supple. JVP hard to see Carotids 2+ bilat; no bruits. No lymphadenopathy or thryomegaly appreciated. Cor: PMI nondisplaced. Regular rate & rhythm. No rubs, gallops or murmurs. Lungs: + wheeze Abdomen: obese soft, nontender, nondistended. No hepatosplenomegaly. No bruits or masses. Good bowel sounds. Extremities: no cyanosis, clubbing, rash, 2-3+ edema Neuro: alert & orientedx3, cranial nerves grossly intact. moves all 4 extremities w/o difficulty. Affect pleasant  She has marked volume overload  (~100 pounds) without significant improvement over the last week in setting of  significant biventricular dysfunction and CKD IV with transplanted kidney. Will wrap legs, increase lasix to 20/hr give metolazone 5 bid. Will touch base with renal as she will either respond to high-dose lasix or we will need to consider HD.   Glori Bickers, MD  2:07 PM

## 2020-07-11 DIAGNOSIS — Z940 Kidney transplant status: Secondary | ICD-10-CM

## 2020-07-11 DIAGNOSIS — I480 Paroxysmal atrial fibrillation: Secondary | ICD-10-CM | POA: Diagnosis not present

## 2020-07-11 DIAGNOSIS — I5023 Acute on chronic systolic (congestive) heart failure: Secondary | ICD-10-CM | POA: Diagnosis not present

## 2020-07-11 LAB — CBC
HCT: 23.9 % — ABNORMAL LOW (ref 36.0–46.0)
Hemoglobin: 7.6 g/dL — ABNORMAL LOW (ref 12.0–15.0)
MCH: 29.9 pg (ref 26.0–34.0)
MCHC: 31.8 g/dL (ref 30.0–36.0)
MCV: 94.1 fL (ref 80.0–100.0)
Platelets: 162 10*3/uL (ref 150–400)
RBC: 2.54 MIL/uL — ABNORMAL LOW (ref 3.87–5.11)
RDW: 17.3 % — ABNORMAL HIGH (ref 11.5–15.5)
WBC: 6.9 10*3/uL (ref 4.0–10.5)
nRBC: 0 % (ref 0.0–0.2)

## 2020-07-11 LAB — COOXEMETRY PANEL
Carboxyhemoglobin: 1.8 % — ABNORMAL HIGH (ref 0.5–1.5)
Methemoglobin: 1.1 % (ref 0.0–1.5)
O2 Saturation: 81 %
Total hemoglobin: 7.9 g/dL — ABNORMAL LOW (ref 12.0–16.0)

## 2020-07-11 LAB — RENAL FUNCTION PANEL
Albumin: 2.6 g/dL — ABNORMAL LOW (ref 3.5–5.0)
Anion gap: 11 (ref 5–15)
BUN: 48 mg/dL — ABNORMAL HIGH (ref 6–20)
CO2: 22 mmol/L (ref 22–32)
Calcium: 6.7 mg/dL — ABNORMAL LOW (ref 8.9–10.3)
Chloride: 109 mmol/L (ref 98–111)
Creatinine, Ser: 3.17 mg/dL — ABNORMAL HIGH (ref 0.44–1.00)
GFR, Estimated: 17 mL/min — ABNORMAL LOW (ref >60)
Glucose, Bld: 89 mg/dL (ref 70–99)
Phosphorus: 4.9 mg/dL — ABNORMAL HIGH (ref 2.5–4.6)
Potassium: 3 mmol/L — ABNORMAL LOW (ref 3.5–5.1)
Sodium: 142 mmol/L (ref 135–145)

## 2020-07-11 LAB — MAGNESIUM: Magnesium: 1.8 mg/dL (ref 1.7–2.4)

## 2020-07-11 LAB — GLUCOSE, CAPILLARY
Glucose-Capillary: 89 mg/dL (ref 70–99)
Glucose-Capillary: 115 mg/dL — ABNORMAL HIGH (ref 70–99)
Glucose-Capillary: 124 mg/dL — ABNORMAL HIGH (ref 70–99)
Glucose-Capillary: 114 mg/dL — ABNORMAL HIGH (ref 70–99)

## 2020-07-11 LAB — TACROLIMUS LEVEL: Tacrolimus (FK506) - LabCorp: 3.3 ng/mL (ref 2.0–20.0)

## 2020-07-11 MED ORDER — MAGNESIUM SULFATE 2 GM/50ML IV SOLN
2.0000 g | Freq: Once | INTRAVENOUS | Status: AC
  Administered 2020-07-11: 2 g via INTRAVENOUS
  Filled 2020-07-11: qty 50

## 2020-07-11 MED ORDER — AMIODARONE HCL IN DEXTROSE 360-4.14 MG/200ML-% IV SOLN
30.0000 mg/h | INTRAVENOUS | Status: AC
Start: 2020-07-11 — End: 2020-07-11
  Administered 2020-07-11: 60 mg/h via INTRAVENOUS
  Filled 2020-07-11: qty 200

## 2020-07-11 MED ORDER — PREDNISONE 5 MG PO TABS
5.0000 mg | ORAL_TABLET | Freq: Every day | ORAL | Status: DC
Start: 2020-07-12 — End: 2020-07-20
  Administered 2020-07-12 – 2020-07-20 (×9): 5 mg via ORAL
  Filled 2020-07-11 (×9): qty 1

## 2020-07-11 MED ORDER — AMIODARONE HCL IN DEXTROSE 360-4.14 MG/200ML-% IV SOLN
30.0000 mg/h | INTRAVENOUS | Status: DC
  Administered 2020-07-12 – 2020-07-13 (×3): 30 mg/h via INTRAVENOUS
  Filled 2020-07-11 (×5): qty 200

## 2020-07-11 MED ORDER — POTASSIUM CHLORIDE CRYS ER 20 MEQ PO TBCR
40.0000 meq | EXTENDED_RELEASE_TABLET | Freq: Two times a day (BID) | ORAL | Status: DC
  Administered 2020-07-11: 40 meq via ORAL
  Filled 2020-07-11: qty 2

## 2020-07-11 MED ORDER — POTASSIUM CHLORIDE CRYS ER 20 MEQ PO TBCR
40.0000 meq | EXTENDED_RELEASE_TABLET | Freq: Three times a day (TID) | ORAL | Status: DC
  Administered 2020-07-11 – 2020-07-13 (×6): 40 meq via ORAL
  Filled 2020-07-11 (×6): qty 2

## 2020-07-11 MED ORDER — LOPERAMIDE HCL 2 MG PO CAPS
4.0000 mg | ORAL_CAPSULE | Freq: Every day | ORAL | Status: DC
  Administered 2020-07-11 – 2020-07-15 (×5): 4 mg via ORAL
  Filled 2020-07-11 (×5): qty 2

## 2020-07-11 NOTE — Consult Note (Addendum)
[facility]  HISTORY AND PHYSICAL  [name] is an 55 y.o. female.    Chief Complaint:  Cough and SOB  HPI: Pt is a 72F with a PMH sig for HTN, HLD, DM, ESRD s/p renal transplant 2011, h/o DVT, and chronic systolic and diastolic CHF who is now seen in consultation at the request of Drs Evette Doffing and [place] for management of CKD/ transplanation in the setting of aggressive diuresis.    Briefly, pt was admitted 07/05/11 for SOB and cough.  Was found to be markedly fluid overloaded with diffuse anasarca.  TTE 07/05/20 with EF 25-30% with mild LV dilation, severe LVH, moderately decreased RV function/mild RV enlargement, moderate aortic insufficiency.  AHF consulted, aggressive diuresis pursued with Lasix gtt, metolazone, and augmented by milrinone.  Co-ox 79%.  Baseline Cr is 2.5, now up to 3.0-3.1 in the setting of decompensated CHF.    She initially wasn't making much headway with her diuresis but she is down  7 kg from yesterday to today with robust UOP.  Creatinine has held steady for the past 3 days.  In this setting we are asked to see.    Today, she is sleeping but easily arousable.  Had had new onset Afib.  On amio gtt now.  She reports a cough still.   In regards to transplant, last visit with Dr Marval Regal was 04/27/20.  She is on myfortic 540 mg BID, pred 5 mg daily, and tacrolimus 3 mg BID (goal trough ~6).  She had 7 g protein at that time.    PMH: Past Medical History:  Diagnosis Date  . Acute heart failure ([place]) 06/2020  . Blood transfusion without reported diagnosis   . CHF (congestive heart failure) ([place])   . 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 GERD (gastroesophageal reflux disease)    medication induced  . Hypertension   . Renal failure    hx of  . 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 [place]  . CHOLECYSTECTOMY  2008  . COLONOSCOPY  10 years ago   Dr.Hung="normal"  . DG AV DIALYSIS  SHUNT ACCESS EXIST*L* OR    . KIDNEY TRANSPLANT  2011  . UMBILICAL HERNIA REPAIR  2009    Past Medical History:  Diagnosis Date  . Acute heart failure ([place]) 06/2020  . Blood transfusion without reported diagnosis   . CHF (congestive heart failure) ([place])   . 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 GERD (gastroesophageal reflux disease)    medication induced  . Hypertension   . Renal failure    hx of  . 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   [facility]; donor was a 57 yo CMV positive person with elevated PRA at 65%; she developed de novo donor specific AB post transplant & was treated wit hplasmaperesis & IVIG & rituximab; As of 05/2012, baselin Cr 1.4-1.6    Medications:   Scheduled: . sodium chloride   Intravenous Once  . apixaban  5 mg Oral BID  . atorvastatin  40 mg Oral Daily  .  calcitRIOL  0.5 mcg Oral Daily  . calcium carbonate  1,000 mg Oral Daily  . calcium-vitamin D  1 tablet Oral BID  . Chlorhexidine Gluconate Cloth  6 each Topical Daily  . DULoxetine  30 mg Oral Daily  . ferrous sulfate  325 mg Oral BID WC  . guaiFENesin  600 mg Oral BID  . insulin aspart  0-5 Units Subcutaneous QHS  . insulin aspart  0-6 Units Subcutaneous TID WC  . isosorbide-hydrALAZINE  1.5 tablet Oral TID  . loperamide  4 mg Oral Daily  . metolazone  5 mg Oral BID  . mycophenolate  540 mg Oral BID  . pantoprazole  40 mg Oral Daily  . potassium chloride  40 mEq Oral TID  . predniSONE  2.5 mg Oral Q breakfast  . sodium chloride flush  3 mL Intravenous Q12H  . tacrolimus  3 mg Oral BID  . vitamin B-12  1,000 mcg Oral  Daily  . Vitamin D (Ergocalciferol)  50,000 Units Oral Q Sat    Medications Prior to Admission  Medication Sig Dispense Refill  . acetaminophen (TYLENOL) 325 MG tablet Take 2 tablets (650 mg total) by mouth every 6 (six) hours as needed for mild pain (or Fever >/= 101). 50 tablet 1  . apixaban (ELIQUIS) 5 MG TABS tablet Take 1 tablet (5 mg total) by mouth 2 (two) times daily. 60 tablet 4  . atorvastatin (LIPITOR) 40 MG tablet Take 1 tablet (40 mg total) by mouth daily. 90 tablet 3  . calcitRIOL (ROCALTROL) 0.5 MCG capsule Take 1 capsule (0.5 mcg total) by mouth daily. 60 capsule 3  . calcium-vitamin D (OSCAL WITH D) 500-200 MG-UNIT tablet Take 1 tablet by mouth 2 (two) times daily. 60 tablet 3  . DULoxetine (CYMBALTA) 30 MG capsule Take 1 capsule (30 mg total) by mouth daily. 60 capsule 3  . ferrous sulfate 325 (65 FE) MG tablet Take 1 tablet (325 mg total) by mouth 2 (two) times daily with a meal. 60 tablet 3  . furosemide (LASIX) 80 MG tablet Take 1 tablet (80 mg total) by mouth daily. 30 tablet 11  . guaiFENesin-dextromethorphan (ROBITUSSIN DM) 100-10 MG/5ML syrup Take 5 mLs by mouth every 4 (four) hours as needed for cough. 118 mL 0  . hydrALAZINE (APRESOLINE) 10 MG tablet Take 1 tablet (10 mg total) by mouth every 8 (eight) hours. 90 tablet 3  . insulin aspart (NOVOLOG) 100 UNIT/ML injection Inject 0-20 Units into the skin 3 (three) times daily with meals. 10 mL 11  . isosorbide mononitrate (IMDUR) 30 MG 24 hr tablet Take 0.5 tablets (15 mg total) by mouth daily. 30 tablet 3  . loratadine (CLARITIN) 10 MG tablet Take 1 tablet (10 mg total) by mouth daily. 90 tablet 3  . metoprolol succinate (TOPROL-XL) 25 MG 24 hr tablet Take 1 tablet (25 mg total) by mouth daily. 30 tablet 3  . mycophenolate (MYFORTIC) 180 MG EC tablet TAKE 3 TABLETS BY MOUTH TWICE DAILY (Patient taking differently: Take 540 mg by mouth 2 (two) times daily.) 540 tablet 0  . omeprazole (PRILOSEC) 40 MG capsule Take 40 mg by  mouth daily.    . predniSONE (DELTASONE) 2.5 MG tablet Take 1 tablet (2.5 mg total) by mouth daily with breakfast. 30 tablet 3  . tacrolimus (PROGRAF) 1 MG capsule Take 3 capsules (3 mg total) by mouth 2 (two) times daily. 180 capsule 3  . triamcinolone ointment (KENALOG) 0.1 % Apply topically 2 (two) times  daily as needed.    . vitamin B-12 (CYANOCOBALAMIN) 1000 MCG tablet Take 1 tablet (1,000 mcg total) by mouth daily. 30 tablet 3  . albuterol (PROVENTIL) (2.5 MG/3ML) 0.083% nebulizer solution Take 3 mLs (2.5 mg total) by nebulization every 6 (six) hours as needed for wheezing or shortness of breath. 75 mL 12  . apixaban (ELIQUIS) 5 MG TABS tablet Take 2 tablets (43m) twice daily for 7 days, then 1 tablet (572m twice daily (Patient not taking: Reported on 07/05/2020) 60 tablet 0    ALLERGIES:   Allergies  Allergen Reactions  . Infed [Iron Dextran] Other (See Comments)    Pt has chest pain  Chest pain  . Cefazolin Itching and Nausea And Vomiting  . Ancef [Cefazolin Sodium] Other (See Comments)    Tolerated rocephin 11/03/10 Reports ancef causes itching, sweating, vomiting (x2-2009?)  . Lisinopril Swelling  . Other     Pt may be allergic to another antibiotic daughter isn't sure and pt unable to verify.    FAM HX: Family History  Problem Relation Age of Onset  . Hypertension Mother   . Diabetes Mother   . Atrial fibrillation Father   . Hypertension Father   . Arthritis Father   . Heart failure Sister   . Hypertension Sister        all 4 sisters  . Hypertension Brother        both brothers  . Diabetes Brother   . Colon cancer Neg Hx   . Stomach cancer Neg Hx     Social History:   reports that she has been smoking cigarettes. She has been smoking about 0.50 packs per day. She has never used smokeless tobacco. She reports that she does not drink alcohol and does not use drugs.  ROS: ROS: all other systems reviewed and are negative except as per HPI  Blood pressure  132/69, pulse 85, temperature 98.6 F (37 C), temperature source Oral, resp. rate 15, height '5\' 11"'  (1.803 m), weight (!) 140.2 kg, last menstrual period 11/05/2012, SpO2 93 %. PHYSICAL EXAM: Physical Exam  GEN sleeping, easily arousable HEENT + facial fullness NECK + JVD to angle of mandible PULM muffled breath sounds bilaterally CV tachycardic, irregular ABD soft EXT 3+ anasarca NEURO AAO x 3   Results for orders placed or performed during the hospital encounter of 07/04/20 (from the past 48 hour(s))  Glucose, capillary     Status: Abnormal   Collection Time: 07/09/20  3:42 PM  Result Value Ref Range   Glucose-Capillary 126 (H) 70 - 99 mg/dL    Comment: Glucose reference range applies only to samples taken after fasting for at least 8 hours.  Hemoglobin and hematocrit, blood     Status: Abnormal   Collection Time: 07/09/20  3:56 PM  Result Value Ref Range   Hemoglobin 7.6 (L) 12.0 - 15.0 g/dL   HCT 25.5 (L) 36.0 - 46.0 %    Comment: Performed at [facility] [place] [address]Glucose, capillary     Status: Abnormal   Collection Time: 07/09/20  9:04 PM  Result Value Ref Range   Glucose-Capillary 118 (H) 70 - 99 mg/dL    Comment: Glucose reference range applies only to samples taken after fasting for at least 8 hours.  Glucose, capillary     Status: None   Collection Time: 07/10/20  5:59 AM  Result Value Ref Range   Glucose-Capillary 91 70 - 99 mg/dL  Comment: Glucose reference range applies only to samples taken after fasting for at least 8 hours.   Comment 1 Notify RN   Cooxemetry Panel (carboxy, met, total hgb, O2 sat)     Status: Abnormal   Collection Time: 07/10/20  6:08 AM  Result Value Ref Range   Total hemoglobin 7.4 (L) 12.0 - 16.0 g/dL   O2 Saturation 79.1 %   Carboxyhemoglobin 1.7 (H) 0.5 - 1.5 %   Methemoglobin 0.8 0.0 - 1.5 %    Comment: Performed at [facility] [address], [place], [place] [address]  Magnesium      Status: None   Collection Time: 07/10/20  7:28 AM  Result Value Ref Range   Magnesium 2.0 1.7 - 2.4 mg/dL    Comment: Performed at [facility] [address], [place], [place] [address]  CBC     Status: Abnormal   Collection Time: 07/10/20  7:28 AM  Result Value Ref Range   WBC 6.8 4.0 - 10.5 K/uL   RBC 2.49 (L) 3.87 - 5.11 MIL/uL   Hemoglobin 7.4 (L) 12.0 - 15.0 g/dL   HCT 23.4 (L) 36.0 - 46.0 %   MCV 94.0 80.0 - 100.0 fL   MCH 29.7 26.0 - 34.0 pg   MCHC 31.6 30.0 - 36.0 g/dL   RDW 17.8 (H) 11.5 - 15.5 %   Platelets 165 150 - 400 K/uL   nRBC 0.0 0.0 - 0.2 %    Comment: Performed at [facility], [place] [address], [place], [place] [address]  Renal function panel     Status: Abnormal   Collection Time: 07/10/20  7:28 AM  Result Value Ref Range   Sodium 143 135 - 145 mmol/L   Potassium 3.1 (L) 3.5 - 5.1 mmol/L   Chloride 109 98 - 111 mmol/L   CO2 21 (L) 22 - 32 mmol/L   Glucose, Bld 91 70 - 99 mg/dL    Comment: Glucose reference range applies only to samples taken after fasting for at least 8 hours.   BUN 44 (H) 6 - 20 mg/dL   Creatinine, Ser 3.19 (H) 0.44 - 1.00 mg/dL   Calcium 6.8 (L) 8.9 - 10.3 mg/dL   Phosphorus 5.1 (H) 2.5 - 4.6 mg/dL   Albumin 2.7 (L) 3.5 - 5.0 g/dL   GFR, Estimated 16 (L) >60 mL/min    Comment: (NOTE) Calculated using the CKD-EPI Creatinine Equation (2021)    Anion gap 13 5 - 15    Comment: Performed at [facility] [address] [place], [place] [address]  Glucose, capillary     Status: Abnormal   Collection Time: 07/10/20 11:50 AM  Result Value Ref Range   Glucose-Capillary 109 (H) 70 - 99 mg/dL    Comment: Glucose reference range applies only to samples taken after fasting for at least 8 hours.  Basic metabolic panel     Status: Abnormal   Collection Time: 07/10/20  3:29 PM  Result Value Ref Range   Sodium 141 135 - 145 mmol/L   Potassium 3.2 (L) 3.5 - 5.1 mmol/L   Chloride 106 98 - 111 mmol/L   CO2 22 22 - 32 mmol/L    Glucose, Bld 113 (H) 70 - 99 mg/dL    Comment: Glucose reference range applies only to samples taken after fasting for at least 8 hours.   BUN 48 (H) 6 - 20 mg/dL   Creatinine, Ser 3.15 (H) 0.44 - 1.00  mg/dL   Calcium 6.6 (L) 8.9 - 10.3 mg/dL   GFR, Estimated 17 (L) >60 mL/min    Comment: (NOTE) Calculated using the CKD-EPI Creatinine Equation (2021)    Anion gap 13 5 - 15    Comment: Performed at [facility], [place] [address], [place], [place] [address]  Glucose, capillary     Status: Abnormal   Collection Time: 07/10/20  4:43 PM  Result Value Ref Range   Glucose-Capillary 114 (H) 70 - 99 mg/dL    Comment: Glucose reference range applies only to samples taken after fasting for at least 8 hours.  Glucose, capillary     Status: Abnormal   Collection Time: 07/10/20  9:37 PM  Result Value Ref Range   Glucose-Capillary 122 (H) 70 - 99 mg/dL    Comment: Glucose reference range applies only to samples taken after fasting for at least 8 hours.   Comment 1 Notify RN    Comment 2 Document in Chart   Cooxemetry Panel (carboxy, met, total hgb, O2 sat)     Status: Abnormal   Collection Time: 07/11/20  5:30 AM  Result Value Ref Range   Total hemoglobin 7.9 (L) 12.0 - 16.0 g/dL   O2 Saturation 81.0 %   Carboxyhemoglobin 1.8 (H) 0.5 - 1.5 %   Methemoglobin 1.1 0.0 - 1.5 %    Comment: Performed at [facility] [address], [place], [place] [address]  Magnesium     Status: None   Collection Time: 07/11/20  5:30 AM  Result Value Ref Range   Magnesium 1.8 1.7 - 2.4 mg/dL    Comment: Performed at [facility] [address] [place], [place] [address]  CBC     Status: Abnormal   Collection Time: 07/11/20  5:30 AM  Result Value Ref Range   WBC 6.9 4.0 - 10.5 K/uL   RBC 2.54 (L) 3.87 - 5.11 MIL/uL   Hemoglobin 7.6 (L) 12.0 - 15.0 g/dL   HCT 23.9 (L) 36.0 - 46.0 %   MCV 94.1 80.0 - 100.0 fL   MCH 29.9 26.0 - 34.0 pg   MCHC 31.8 30.0 - 36.0 g/dL   RDW 17.3 (H) 11.5 - 15.5  %   Platelets 162 150 - 400 K/uL   nRBC 0.0 0.0 - 0.2 %    Comment: Performed at [facility] [address] [place], [place] [address]  Renal function panel     Status: Abnormal   Collection Time: 07/11/20  5:30 AM  Result Value Ref Range   Sodium 142 135 - 145 mmol/L   Potassium 3.0 (L) 3.5 - 5.1 mmol/L   Chloride 109 98 - 111 mmol/L   CO2 22 22 - 32 mmol/L   Glucose, Bld 89 70 - 99 mg/dL    Comment: Glucose reference range applies only to samples taken after fasting for at least 8 hours.   BUN 48 (H) 6 - 20 mg/dL   Creatinine, Ser 3.17 (H) 0.44 - 1.00 mg/dL   Calcium 6.7 (L) 8.9 - 10.3 mg/dL   Phosphorus 4.9 (H) 2.5 - 4.6 mg/dL   Albumin 2.6 (L) 3.5 - 5.0 g/dL   GFR, Estimated 17 (L) >60 mL/min    Comment: (NOTE) Calculated using the CKD-EPI Creatinine Equation (2021)    Anion gap 11 5 - 15    Comment: Performed at [facility] [address] [place], [place] [address]  Glucose, capillary  Status: None   Collection Time: 07/11/20  6:40 AM  Result Value Ref Range   Glucose-Capillary 89 70 - 99 mg/dL    Comment: Glucose reference range applies only to samples taken after fasting for at least 8 hours.  Glucose, capillary     Status: Abnormal   Collection Time: 07/11/20 11:27 AM  Result Value Ref Range   Glucose-Capillary 115 (H) 70 - 99 mg/dL    Comment: Glucose reference range applies only to samples taken after fasting for at least 8 hours.    No results found.  Assessment/Plan  1.  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  Fortunately kidney is holding steady with aggressive diuresis.  No real indication for RRT yet as she made > 5L UOP yesterday.   - will check tac level - continue present IS - no indication for RRT at present, following closely  - recheck UP/C  2.  Acute on chronic systolic and diastolic CHF - EF 77-37% with mild LV dilation, severe LVH, moderately decreased RV function/mild RV enlargement,  moderate aortic insufficiency.  - AHF following - on Lasix gtt, milrinone, and metolazone - good diuresis- has much more to go  3.  Afib - new - on amio gtt now - being considered for cardioversion  4.  H/o DVT - on Eliquis  5.  Diarrhea: - GI panel pending - this can potentiate tac levels too  6.  Dispo: pending   [name] 07/11/2020, 1:22 PM

## 2020-07-11 NOTE — Progress Notes (Addendum)
Patient ID: [name], female   DOB: 08-Nov-1965, 55 y.o.   MRN: [id number]     Advanced Heart Failure Rounding Note  PCP-Cardiologist: No primary care provider on file.   Subjective:   Yesterday lasix drip increased to 20 mg bid and given metolazone 5 mg twice a day. Remains on milrinone. Brisk diuresis noted.     Complaining of cough.   Lab Results  Component Value Date   CREATININE 3.17 (H) 07/11/2020   CREATININE 3.15 (H) 07/10/2020   CREATININE 3.19 (H) 07/10/2020    Objective:   Weight Range: (!) 140.2 kg Body mass index is 43.11 kg/m.   Vital Signs:   Temp:  [98 F (36.7 C)-98.6 F (37 C)] 98.6 F (37 C) (01/04 0400) Pulse Rate:  [73-87] 85 (01/04 0400) Resp:  [15-17] 15 (01/04 0400) BP: (142-169)/(62-83) 142/62 (01/04 0400) SpO2:  [93 %-100 %] 93 % (01/04 0400) Weight:  [140.2 kg] 140.2 kg (01/04 0535) Last BM Date: 07/11/19  Weight change: Filed Weights   07/09/20 0600 07/10/20 0300 07/11/20 0535  Weight: (!) 148.5 kg (!) 147.9 kg (!) 140.2 kg    Intake/Output:   Intake/Output Summary (Last 24 hours) at 07/11/2020 0932 Last data filed at 07/11/2020 0500 Gross per 24 hour  Intake 240 ml  Output 4425 ml  Net -4185 ml      Physical Exam  CVP 14-15  General No resp difficulty HEENT: normal Neck: supple. JVP to jaw . Carotids 2+ bilat; no bruits. No lymphadenopathy or thryomegaly appreciated. Cor: PMI nondisplaced. Irregular rate & rhythm. No rubs, gallops. AS. Lungs: EW on 4.5 liters oxygen Abdomen: obese, with edema,nontender, nondistended. No hepatosplenomegaly. No bruits or masses. Good bowel sounds. Extremities: no cyanosis, clubbing, rash, R and LLE 3+ edema  unna boots.  Neuro: alert & orientedx3, cranial nerves grossly intact. moves all 4 extremities w/o difficulty. Affect pleasant    Afib 130-140s   EKG    N/a   Labs    CBC Recent Labs    07/10/20 0728 07/11/20 0530  WBC 6.8 6.9  HGB 7.4* 7.6*  HCT 23.4* 23.9*  MCV 94.0  94.1  PLT 165 409   Basic Metabolic Panel Recent Labs    07/10/20 0728 07/10/20 1529 07/11/20 0530  NA 143 141 142  K 3.1* 3.2* 3.0*  CL 109 106 109  CO2 21* 22 22  GLUCOSE 91 113* 89  BUN 44* 48* 48*  CREATININE 3.19* 3.15* 3.17*  CALCIUM 6.8* 6.6* 6.7*  MG 2.0  --  1.8  PHOS 5.1*  --  4.9*   Liver Function Tests Recent Labs    07/10/20 0728 07/11/20 0530  ALBUMIN 2.7* 2.6*   No results for input(s): LIPASE, AMYLASE in the last 72 hours. Cardiac Enzymes No results for input(s): CKTOTAL, CKMB, CKMBINDEX, TROPONINI in the last 72 hours.  BNP: BNP (last 3 results) Recent Labs    03/14/20 0920 07/04/20 1813  BNP >4,500.0* 8,119.1*    ProBNP (last 3 results) No results for input(s): PROBNP in the last 8760 hours.   D-Dimer No results for input(s): DDIMER in the last 72 hours. Hemoglobin A1C No results for input(s): HGBA1C in the last 72 hours. Fasting Lipid Panel No results for input(s): CHOL, HDL, LDLCALC, TRIG, CHOLHDL, LDLDIRECT in the last 72 hours. Thyroid Function Tests No results for input(s): TSH, T4TOTAL, T3FREE, THYROIDAB in the last 72 hours.  Invalid input(s): FREET3  Other results:   Imaging    No results found.  Medications:     Scheduled Medications: . sodium chloride   Intravenous Once  . apixaban  5 mg Oral BID  . atorvastatin  40 mg Oral Daily  . calcitRIOL  0.5 mcg Oral Daily  . calcium carbonate  1,000 mg Oral Daily  . calcium-vitamin D  1 tablet Oral BID  . Chlorhexidine Gluconate Cloth  6 each Topical Daily  . DULoxetine  30 mg Oral Daily  . ferrous sulfate  325 mg Oral BID WC  . guaiFENesin  600 mg Oral BID  . insulin aspart  0-5 Units Subcutaneous QHS  . insulin aspart  0-6 Units Subcutaneous TID WC  . isosorbide-hydrALAZINE  1.5 tablet Oral TID  . loperamide  4 mg Oral Daily  . magnesium oxide  800 mg Oral Daily  . metolazone  5 mg Oral BID  . mycophenolate  540 mg Oral BID  . pantoprazole  40 mg Oral Daily  .  potassium chloride  40 mEq Oral BID  . predniSONE  2.5 mg Oral Q breakfast  . sodium chloride flush  3 mL Intravenous Q12H  . tacrolimus  3 mg Oral BID  . vitamin B-12  1,000 mcg Oral Daily  . Vitamin D (Ergocalciferol)  50,000 Units Oral Q Sat    Infusions: . sodium chloride    . furosemide (LASIX) 200 mg in dextrose 5% 100 mL (2mg /mL) infusion 20 mg/hr (07/11/20 0654)  . milrinone 0.125 mcg/kg/min (07/11/20 0653)    PRN Medications: sodium chloride, acetaminophen, ondansetron (ZOFRAN) IV, sodium chloride flush    Assessment/Plan    1. Acute on chronic systolic CHF: Echo this admission with EF 25-30% with mild LV dilation, severe LVH, moderately decreased RV function/mild RV enlargement, moderate aortic insufficiency. Cause for cardiomyopathy is uncertain.  She had Cardiolite in 9/21 showing inferior and anterior fixed defects with minimal ischemia, possible ischemic cardiomyopathy.  Not cathed given no chest pain and CKD stage IV. Of note, sister had a recent heart transplant (? familial cardiomyopathy).  On exam, CVP 17, extremities edematous.  She is on milrinone 0.125 with co-ox 81%.  - Continue milrinone 0.125 and Lasix gtt 20 mg/hr.  Continue metolazone 5 mg twice a day.  - Unna boots - Increase bidil 2 tabs tid.  - With CKD stage IV, no ARB/ARNI/spironolactone/digoxin.  - Can restart her home Toprol XL 25 mg daily when volume better controlled.  2. CKD stage IV: History of renal transplant at [facility]. Baseline Cr around 2.5. Today Cr 3.2. Making > 4 liters of urine.   - Continue prednisone and tacrolimus.  - Follow creatinine closely with diuresis. - Nephrology was consulted.   3. CAD: Extensive CAD noted on prior chest CT.  She had Cardiolite in 9/21 showing inferior and anterior fixed defects with minimal ischemia, possible ischemic cardiomyopathy.  Not cathed given no chest pain and CKD stage IV. - Continue statin.  - No ASA given apixaban use.  4. Atrial fibrillation: She  had been in NSR until this morning. .   - In A fib RVR today. Start amio drip. Will need cardioversion if she doesn't convert.  - Continue apixaban.  5. H/o DVT: On apixaban.  6. Type 2 DM.  -Stable on SSI.  7. Anemia: Hgb down to 7.6today, ? due to chronic disease/renal disease.  FOBT was negative.  - s/p 1 PRBC 07/10/2019 -continue to monitor 8. Productive cough  -requiring 4.5L O2 SpO2 100%, on presentation required bipap -not on home oxygen -CXR on 1/1 opacity  and edema -Continue  mucinex BID   Amy Clegg NP-C  07/11/2020 9:32 AM   Patient seen and examined with the above-signed Advanced Practice Provider and/or Housestaff. I personally reviewed laboratory data, imaging studies and relevant notes. I independently examined the patient and formulated the important aspects of the plan. I have edited the note to reflect any of my changes or salient points. I have personally discussed the plan with the patient and/or family.  She is starting to diurese well with milrinone and increase lasix drip. Remains markedly fluid overloaded. Renal function stable overnight. Developed AF with RVR.  General:  Sitting up in bed No resp difficulty HEENT: normal Neck: supple. JVP to jaw. Carotids 2+ bilat; no bruits. No lymphadenopathy or thryomegaly appreciated. Cor: PMI nondisplaced. Irregular tachy Lungs: clear Abdomen: obese soft, nontender, nondistended. No hepatosplenomegaly. No bruits or masses. Good bowel sounds. Extremities: no cyanosis, clubbing, rash, 3+ edema Neuro: alert & orientedx3, cranial nerves grossly intact. moves all 4 extremities w/o difficulty. Affect pleasant  Diuresis improved. Renal function stable overnight. Will continue aggressive fluid removal gas she is markedly volume overloaded. If fails diuresis will need HD. D/w Renal who will see her today. Would not push anti-HTN regimen too aggressively as we will need BP for kidney perfusion and diuresis.   She is in AF with RVR.  Start IV amio (watch for interaction with tacrolimus - will raise levels - Pharmacy following. Continue apixaban  Glori Bickers, MD  12:04 PM

## 2020-07-11 NOTE — Progress Notes (Signed)
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    PT Comments    Pt not progressing towards physical therapy goals; remains very limited in her mobility level due to significant pain in BLE's, edema, and weakness. Pt received with liquid stool incontinence despite rectal tube placement; assisted with rolling to right and left for peri care. Pt with poor tolerance for even rolling in the bed. Discussed with MD to order bariatric air mattress bed for pressure relief. Continue to recommend SNF for ongoing Physical Therapy.       Follow Up Recommendations  SNF     Equipment Recommendations  Wheelchair (measurements PT);Wheelchair cushion (measurements PT);Hospital bed;Other (comment);3in1 (PT) (hoyer lift)    Recommendations for Other Services       Precautions / Restrictions Precautions Precautions: Fall Restrictions Weight Bearing Restrictions: No    Mobility  Bed Mobility Overal bed mobility: Needs Assistance Bed Mobility: Rolling Rolling: +2 for physical assistance;Max assist         General bed mobility comments: Heavy maxA + 2 to roll to left and right, cues for reaching and turning head towards side she was rolling to  Transfers                 General transfer comment: unable, will require lift equipment  Ambulation/Gait                 Stairs             Wheelchair Mobility    Modified Rankin (Stroke Patients Only)       Balance                                            Cognition Arousal/Alertness: Awake/alert Behavior  During Therapy: WFL for tasks assessed/performed Overall Cognitive Status: Difficult to assess                                 General Comments: Requires repetition at times but this is likely due to hearing?      Exercises General Exercises - Lower Extremity Ankle Circles/Pumps: PROM;Both;10 reps;Supine    General Comments        Pertinent Vitals/Pain Pain Assessment: Faces Faces Pain Scale: Hurts whole lot Pain Location: B LEs, whole body with any movement Pain Descriptors / Indicators: Moaning;Grimacing;Guarding Pain Intervention(s): Limited activity within patient's tolerance;Monitored during session;Repositioned    Home Living                      Prior Function            PT Goals (current goals can now be found in the care plan section) Acute Rehab PT Goals Potential to Achieve Goals: Fair Progress towards PT goals: Not progressing toward goals - comment    Frequency    Min 2X/week      PT Plan Current plan remains appropriate    Co-evaluation  AM-PAC PT "6 Clicks" Mobility   Outcome Measure  Help needed turning from your back to your side while in a flat bed without using bedrails?: Total Help needed moving from lying on your back to sitting on the side of a flat bed without using bedrails?: Total Help needed moving to and from a bed to a chair (including a wheelchair)?: Total Help needed standing up from a chair using your arms (e.g., wheelchair or bedside chair)?: Total Help needed to walk in hospital room?: Total Help needed climbing 3-5 steps with a railing? : Total 6 Click Score: 6    End of Session Equipment Utilized During Treatment: Oxygen Activity Tolerance: Patient limited by pain;Other (comment) (tachycardia) Patient left: in bed;with call bell/phone within reach;with bed alarm set Nurse Communication: Mobility status;Need for lift equipment PT Visit Diagnosis: Other abnormalities of gait and  mobility (R26.89);Muscle weakness (generalized) (M62.81);Pain     Time: 1000-1024 PT Time Calculation (min) (ACUTE ONLY): 24 min  Charges:  $Therapeutic Activity: 23-37 mins                     Wyona Almas, PT, DPT Acute Rehabilitation Services Pager [phone] Office [place] 07/11/2020, 12:37 PM

## 2020-07-11 NOTE — Progress Notes (Addendum)
   Subjective:  [name] is a 55 y.o. with PMH of HFrEF, PAF, CKD4 s/p renal transplant, T2DM, HTN, DVT on eliquis admit for acute on chronic systolic heart failure on hospital day 7   Patient evaluated at bedside this morning. States her breathing feels pretty good currently. Notes that her flexiseal is leaking somewhat. States she rested pretty well. Denies abdominal pain, CP, palpitations. Has not noticed bleeding anywhere.   Objective:  Vital signs in last 24 hours: Vitals:   07/10/20 1940 07/11/20 0036 07/11/20 0400 07/11/20 0535  BP: (!) 169/81 (!) 161/76 (!) 142/62   Pulse: 73 81 85   Resp: 16 16 15    Temp: 98.4 F (36.9 C) 98.3 F (36.8 C) 98.6 F (37 C)   TempSrc: Oral Oral Oral   SpO2: 100% 96% 93%   Weight:    (!) 140.2 kg  Height:       Gen: Well-developed, well nourished, chronically ill-appearing, NAD HEENT: NCAT head, hearing intact, EOMI, [place] on 4L CV: RRR, S1, S2 normal, No rubs, no murmurs, no gallops Pulm: Rales bilaterally up to mid thorax Abd: Soft, BS+, Distended, + abdominal wall edema Extm: Unna boots in place, 3+ pitting edema up thighs and abdomen Skin: Dry, Warm, normal turgor  Assessment/Plan:  Principal Problem:   Acute on chronic heart failure (HCC) Active Problems:   S/p cadaver renal transplant   Paroxysmal atrial fibrillation (HCC)   Chronic kidney disease (CKD), stage IV (severe) (HCC)  [name] is a 55 y.o. with PMH of HFrEF, PAF, CKD4 s/p renal transplant, T2DM, HTN, DVT on eliquis admit for acute on chronic systolic heart failure  Acute hypoxic respiratory failure 2/2 pulmonary edema Acute on chronic combined systolic/diastolic heart failure Continues to have anasarca on exam. Able to diurese net 4.8L out yesterday. Co-ox  panel w/o significant change. Current weight 140.2kg, dry weight 104kg K 3.0, Mag 1.8 - Appreciate cardiology recs: c/w milrinone 0.125 and furosemide 20mg  /hr, metolazone 5mg  bid, consult nephro -  Appreciate nephro recs - Trend BMP, Mag - Strict I&Os, Daily Weights - Fluid restriction - Keep O2 sat >88 - Replenish K as needed >4.0, Mag as needed >2.0  CKD4 s/p renal transplant On chronic immunosuppression. Baseline creatinine 2.5. Renal fx stable around 3 since admission, 3.14->3.1->3.05->3.19->3.17 - Trend renal fx - Avoid nephrotoxic meds when able - C/w home meds: prednisone, tacrolimus  Acute on chronic anemia of chronic disease Required 1unit pRbc on 07/09/20. No evidence of bleed while on anti-coagulation. Hgb stable overnight 7.6->7.4->7.6 - Trend cbc - Transfuse if hgb<7  PAF CHADSVASC2 score of 4. On anticoagulation with eliquis. HR 85 this am.  - Holding beta-blocker in setting of heart failure - Telemetry - C/w eliquis  Diarrhea:  Multiple liquid bowel movements that are putting the foley at risk for infection, even despite flexiseal. No abdominal pain, little suspicion for acute infectious diarrhea. Probably related to gut edema. Will treat with imodium to reduce stool output and hopefully lower risk of catheter-associated UTI.  HTN Am bp 142/62. On milrinone. - C/w diuresis as above  T2DM Am cbg 89 - SSI - Glucose checks  DVT prophx: eliquis Diet: CM, renal Code: Full  Prior to Admission Living Arrangement: Home Anticipated Discharge Location: SNF Barriers to Discharge: Medical tx Dispo: Anticipated discharge in approximately 4-5 day(s).   Mosetta Anis, MD 07/11/2020, 7:08 AM Pager: [phone] After 5pm on weekdays and 1pm on weekends: On Call Pager: [phone]

## 2020-07-12 DIAGNOSIS — I472 Ventricular tachycardia: Secondary | ICD-10-CM | POA: Diagnosis not present

## 2020-07-12 DIAGNOSIS — I480 Paroxysmal atrial fibrillation: Secondary | ICD-10-CM | POA: Diagnosis not present

## 2020-07-12 DIAGNOSIS — I5023 Acute on chronic systolic (congestive) heart failure: Secondary | ICD-10-CM | POA: Diagnosis not present

## 2020-07-12 DIAGNOSIS — I4729 Other ventricular tachycardia: Secondary | ICD-10-CM

## 2020-07-12 DIAGNOSIS — R197 Diarrhea, unspecified: Secondary | ICD-10-CM

## 2020-07-12 LAB — CBC
HCT: 24.5 % — ABNORMAL LOW (ref 36.0–46.0)
Hemoglobin: 8.1 g/dL — ABNORMAL LOW (ref 12.0–15.0)
MCH: 30.7 pg (ref 26.0–34.0)
MCHC: 33.1 g/dL (ref 30.0–36.0)
MCV: 92.8 fL (ref 80.0–100.0)
Platelets: 192 10*3/uL (ref 150–400)
RBC: 2.64 MIL/uL — ABNORMAL LOW (ref 3.87–5.11)
RDW: 17.3 % — ABNORMAL HIGH (ref 11.5–15.5)
WBC: 7.2 10*3/uL (ref 4.0–10.5)
nRBC: 0 % (ref 0.0–0.2)

## 2020-07-12 LAB — GLUCOSE, CAPILLARY
Glucose-Capillary: 107 mg/dL — ABNORMAL HIGH (ref 70–99)
Glucose-Capillary: 115 mg/dL — ABNORMAL HIGH (ref 70–99)
Glucose-Capillary: 149 mg/dL — ABNORMAL HIGH (ref 70–99)
Glucose-Capillary: 121 mg/dL — ABNORMAL HIGH (ref 70–99)

## 2020-07-12 LAB — GASTROINTESTINAL PANEL BY PCR, STOOL (REPLACES STOOL CULTURE)

## 2020-07-12 LAB — BASIC METABOLIC PANEL
Anion gap: 11 (ref 5–15)
BUN: 50 mg/dL — ABNORMAL HIGH (ref 6–20)
CO2: 23 mmol/L (ref 22–32)
Calcium: 6.9 mg/dL — ABNORMAL LOW (ref 8.9–10.3)
Chloride: 108 mmol/L (ref 98–111)
Creatinine, Ser: 3.28 mg/dL — ABNORMAL HIGH (ref 0.44–1.00)
GFR, Estimated: 16 mL/min — ABNORMAL LOW (ref >60)
Glucose, Bld: 141 mg/dL — ABNORMAL HIGH (ref 70–99)
Potassium: 3.3 mmol/L — ABNORMAL LOW (ref 3.5–5.1)
Sodium: 142 mmol/L (ref 135–145)

## 2020-07-12 LAB — RENAL FUNCTION PANEL
Albumin: 2.5 g/dL — ABNORMAL LOW (ref 3.5–5.0)
Anion gap: 12 (ref 5–15)
BUN: 52 mg/dL — ABNORMAL HIGH (ref 6–20)
CO2: 24 mmol/L (ref 22–32)
Calcium: 6.8 mg/dL — ABNORMAL LOW (ref 8.9–10.3)
Chloride: 106 mmol/L (ref 98–111)
Creatinine, Ser: 3.18 mg/dL — ABNORMAL HIGH (ref 0.44–1.00)
GFR, Estimated: 16 mL/min — ABNORMAL LOW (ref >60)
Glucose, Bld: 99 mg/dL (ref 70–99)
Phosphorus: 4.9 mg/dL — ABNORMAL HIGH (ref 2.5–4.6)
Potassium: 3.2 mmol/L — ABNORMAL LOW (ref 3.5–5.1)
Sodium: 142 mmol/L (ref 135–145)

## 2020-07-12 LAB — COOXEMETRY PANEL
Carboxyhemoglobin: 1.5 % (ref 0.5–1.5)
Methemoglobin: 0.8 % (ref 0.0–1.5)
O2 Saturation: 76.5 %
Total hemoglobin: 8 g/dL — ABNORMAL LOW (ref 12.0–16.0)

## 2020-07-12 LAB — MAGNESIUM
Magnesium: 2 mg/dL (ref 1.7–2.4)
Magnesium: 1.9 mg/dL (ref 1.7–2.4)

## 2020-07-12 LAB — PROTEIN / CREATININE RATIO, URINE
Creatinine, Urine: 24.81 mg/dL
Protein Creatinine Ratio: 1.25 mg/mg{Cre} — ABNORMAL HIGH (ref 0.00–0.15)
Total Protein, Urine: 31 mg/dL

## 2020-07-12 MED ORDER — POTASSIUM CHLORIDE CRYS ER 20 MEQ PO TBCR
20.0000 meq | EXTENDED_RELEASE_TABLET | Freq: Once | ORAL | Status: AC
  Administered 2020-07-12: 20 meq via ORAL
  Filled 2020-07-12: qty 1

## 2020-07-12 MED ORDER — POTASSIUM CHLORIDE CRYS ER 20 MEQ PO TBCR
40.0000 meq | EXTENDED_RELEASE_TABLET | Freq: Once | ORAL | Status: AC
  Administered 2020-07-12: 40 meq via ORAL
  Filled 2020-07-12: qty 2

## 2020-07-12 MED ORDER — ISOSORB DINITRATE-HYDRALAZINE 20-37.5 MG PO TABS: 2.0000 | ORAL_TABLET | Freq: Three times a day (TID) | ORAL | Status: DC

## 2020-07-12 MED ORDER — ISOSORB DINITRATE-HYDRALAZINE 20-37.5 MG PO TABS
1.5000 | ORAL_TABLET | Freq: Three times a day (TID) | ORAL | Status: DC
  Administered 2020-07-12 – 2020-07-18 (×18): 1.5 via ORAL
  Filled 2020-07-12 (×18): qty 2

## 2020-07-12 NOTE — Care Management Important Message (Signed)
Important Message  Patient Details  Name: HAADIYA FROGGE MRN: [id number] Date of Birth: 26-Sep-1965   Medicare Important Message Given:  Yes     Shelda Altes 07/12/2020, 9:04 AM

## 2020-07-12 NOTE — Progress Notes (Signed)
[facility] Progress Note    Assessment/ Plan:   1.  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  Fortunately kidney is holding steady with aggressive diuresis.  No real indication for RRT yet as she made > 5L UOP yesterday.   - will check tac level- tac level 3.3 on 07/2020, level drawn 07/11/20 pending - continue present IS - no indication for RRT at present, following closely  - recheck UP/C, pending (was 1.5 g on admission) - I am favoring trying to resolve volume overload with aggressive diuresis as being done--> my main concern with RRT (likely CRRT) would be inducing ATN in the transplant kidney and then having no/ prolonged recovery.  As long as Cr stable, UOP good, weights are coming down, we can keep the course as is  2.  Acute on chronic systolic and diastolic CHF - EF 62-37% with mild LV dilation, severe LVH, moderately decreased RV function/mild RV enlargement, moderate aortic insufficiency.  - AHF following - on Lasix gtt, milrinone, and metolazone - good diuresis- has much more to go but we are making headway  3.  Afib - new - on amio gtt now - being considered for cardioversion  4.  H/o DVT - on Eliquis  5.  Diarrhea: - GI panel + for sapovirus - this can potentiate tac levels too  6.  Dispo: pending  Subjective:    Continues to have brisk diuresis and stable creatinine.  Weights are down significantly.     Objective:   BP (!) 157/69 (BP Location: Right Arm)   Pulse 79   Temp 98.5 F (36.9 C) (Oral)   Resp 18   Ht 5\' 11"  (1.803 m)   Wt 134.3 kg   LMP 11/05/2012   SpO2 96%   BMI 41.28 kg/m   Intake/Output Summary (Last 24 hours) at 07/12/2020 1126 Last data filed at 07/12/2020 0542 Gross per 24 hour  Intake 1362.72 ml  Output 3600 ml  Net -2237.28 ml   Weight change: -5.935 kg  Physical Exam: GEN sleeping, easily arousable HEENT + facial fullness NECK + JVD to angle of mandible PULM muffled  breath sounds bilaterally CV RRR this AM to ascultation, some ectopy noted on monitor ABD soft EXT 3+ anasarca NEURO AAO x 3  Imaging: No results found.  Labs: BMET Recent Labs  Lab 07/06/20 0302 07/07/20 0254 07/08/20 0031 07/09/20 0405 07/10/20 0728 07/10/20 1529 07/11/20 0530 07/12/20 0302  NA 142 141 142 143 143 141 142 142  K 3.5 3.6 3.5 3.3* 3.1* 3.2* 3.0* 3.2*  CL 112* 110 110 112* 109 106 109 106  CO2 18* 19* 20* 20* 21* 22 22 24   GLUCOSE 110* 92 121* 97 91 113* 89 99  BUN 38* 41* 43* 43* 44* 48* 48* 52*  CREATININE 2.98* 3.14* 3.10* 3.05* 3.19* 3.15* 3.17* 3.18*  CALCIUM 6.1* 6.1* 6.1* 6.2* 6.8* 6.6* 6.7* 6.8*  PHOS 5.3* 5.0* 5.2* 5.0* 5.1*  --  4.9* 4.9*   CBC Recent Labs  Lab 07/09/20 0648 07/09/20 1556 07/10/20 0728 07/11/20 0530 07/12/20 0302  WBC 6.2  --  6.8 6.9 7.2  HGB 6.8* 7.6* 7.4* 7.6* 8.1*  HCT 23.2* 25.5* 23.4* 23.9* 24.5*  MCV 97.9  --  94.0 94.1 92.8  PLT 170  --  165 162 192    Medications:    . sodium chloride   Intravenous Once  . apixaban  5 mg Oral BID  . atorvastatin  40 mg Oral Daily  . calcitRIOL  0.5 mcg Oral Daily  . calcium carbonate  1,000 mg Oral Daily  . calcium-vitamin D  1 tablet Oral BID  . Chlorhexidine Gluconate Cloth  6 each Topical Daily  . DULoxetine  30 mg Oral Daily  . ferrous sulfate  325 mg Oral BID WC  . guaiFENesin  600 mg Oral BID  . insulin aspart  0-5 Units Subcutaneous QHS  . insulin aspart  0-6 Units Subcutaneous TID WC  . isosorbide-hydrALAZINE  1.5 tablet Oral TID  . loperamide  4 mg Oral Daily  . metolazone  5 mg Oral BID  . mycophenolate  540 mg Oral BID  . pantoprazole  40 mg Oral Daily  . potassium chloride  40 mEq Oral TID  . potassium chloride  40 mEq Oral Once  . predniSONE  5 mg Oral Q breakfast  . sodium chloride flush  3 mL Intravenous Q12H  . tacrolimus  3 mg Oral BID  . vitamin B-12  1,000 mcg Oral Daily  . Vitamin D (Ergocalciferol)  50,000 Units Oral Q Sat       Madelon Lips MD 07/12/2020, 11:26 AM

## 2020-07-12 NOTE — Progress Notes (Signed)
Central monitor called patient had 9 seconds V.Tach 24 beats patient was sleeping at the time asymptomatic. Cardiology NP notified, see epic for intervention. Will continue to monitor the patient.

## 2020-07-12 NOTE — Progress Notes (Addendum)
Patient ID: [name], female   DOB: May 19, 1966, 55 y.o.   MRN: [id number]     Advanced Heart Failure Rounding Note  PCP-Cardiologist: No primary care provider on file.   Subjective:    Co-ox 77%, CVP 10, -13 lbs on lasix 20 mg/h with metolazone 5 BID.  Today, sleeping but wakes to verbal stimuli, alert and oriented to place, year and events.  Still has productive cough which reports improving.  Denies chest pain.    Lab Results  Component Value Date   CREATININE 3.18 (H) 07/12/2020   CREATININE 3.17 (H) 07/11/2020   CREATININE 3.15 (H) 07/10/2020    Objective:   Weight Range: 134.3 kg Body mass index is 41.28 kg/m.   Vital Signs:   Temp:  [98.6 F (37 C)-98.8 F (37.1 C)] 98.8 F (37.1 C) (01/05 0402) Pulse Rate:  [72-77] 74 (01/05 0402) Resp:  [13-19] 13 (01/05 0402) BP: (132-165)/(66-69) 165/66 (01/05 0402) SpO2:  [97 %-100 %] 97 % (01/05 0402) Weight:  [134.3 kg] 134.3 kg (01/05 0009) Last BM Date: 07/10/20  Weight change: Filed Weights   07/10/20 0300 07/11/20 0535 07/12/20 0009  Weight: (!) 147.9 kg (!) 140.2 kg 134.3 kg    Intake/Output:   Intake/Output Summary (Last 24 hours) at 07/12/2020 0720 Last data filed at 07/12/2020 0542 Gross per 24 hour  Intake 1362.72 ml  Output 3600 ml  Net -2237.28 ml      Physical Exam   CVP 10 General:  Well appearing. No resp difficulty HEENT: normal Neck: supple. + JVD.  Cor:  Irregular rate & rhythm. No rubs, gallops. + murmurs. Lungs: Diminished.  Abdomen: soft, nontender, nondistended.  Good bowel sounds. Extremities: no cyanosis, clubbing, rash. RUE>LUE edema and B/L LE edema Neuro: alert & oriented x 3 (person, place, year and events), moves all 4 extremities w/o difficulty. Affect sleepy.    Telemetry: Afib.  Personally reviewed.   EKG    N/a   Labs    CBC Recent Labs    07/11/20 0530 07/12/20 0302  WBC 6.9 7.2  HGB 7.6* 8.1*  HCT 23.9* 24.5*  MCV 94.1 92.8  PLT 162 673   Basic  Metabolic Panel Recent Labs    07/11/20 0530 07/12/20 0302  NA 142 142  K 3.0* 3.2*  CL 109 106  CO2 22 24  GLUCOSE 89 99  BUN 48* 52*  CREATININE 3.17* 3.18*  CALCIUM 6.7* 6.8*  MG 1.8 2.0  PHOS 4.9* 4.9*   Liver Function Tests Recent Labs    07/11/20 0530 07/12/20 0302  ALBUMIN 2.6* 2.5*   No results for input(s): LIPASE, AMYLASE in the last 72 hours. Cardiac Enzymes No results for input(s): CKTOTAL, CKMB, CKMBINDEX, TROPONINI in the last 72 hours.  BNP: BNP (last 3 results) Recent Labs    03/14/20 0920 07/04/20 1813  BNP >4,500.0* 4,193.7*    ProBNP (last 3 results) No results for input(s): PROBNP in the last 8760 hours.   D-Dimer No results for input(s): DDIMER in the last 72 hours. Hemoglobin A1C No results for input(s): HGBA1C in the last 72 hours. Fasting Lipid Panel No results for input(s): CHOL, HDL, LDLCALC, TRIG, CHOLHDL, LDLDIRECT in the last 72 hours. Thyroid Function Tests No results for input(s): TSH, T4TOTAL, T3FREE, THYROIDAB in the last 72 hours.  Invalid input(s): FREET3  Other results:   Imaging    No results found.   Medications:     Scheduled Medications: . sodium chloride   Intravenous Once  .  apixaban  5 mg Oral BID  . atorvastatin  40 mg Oral Daily  . calcitRIOL  0.5 mcg Oral Daily  . calcium carbonate  1,000 mg Oral Daily  . calcium-vitamin D  1 tablet Oral BID  . Chlorhexidine Gluconate Cloth  6 each Topical Daily  . DULoxetine  30 mg Oral Daily  . ferrous sulfate  325 mg Oral BID WC  . guaiFENesin  600 mg Oral BID  . insulin aspart  0-5 Units Subcutaneous QHS  . insulin aspart  0-6 Units Subcutaneous TID WC  . isosorbide-hydrALAZINE  1.5 tablet Oral TID  . loperamide  4 mg Oral Daily  . metolazone  5 mg Oral BID  . mycophenolate  540 mg Oral BID  . pantoprazole  40 mg Oral Daily  . potassium chloride  40 mEq Oral TID  . predniSONE  5 mg Oral Q breakfast  . sodium chloride flush  3 mL Intravenous Q12H  .  tacrolimus  3 mg Oral BID  . vitamin B-12  1,000 mcg Oral Daily  . Vitamin D (Ergocalciferol)  50,000 Units Oral Q Sat    Infusions: . sodium chloride    . amiodarone 30 mg/hr (07/12/20 0527)  . furosemide (LASIX) 200 mg in dextrose 5% 100 mL (2mg /mL) infusion 20 mg/hr (07/12/20 0310)  . milrinone 0.125 mcg/kg/min (07/12/20 0310)    PRN Medications: sodium chloride, acetaminophen, ondansetron (ZOFRAN) IV, sodium chloride flush    Assessment/Plan    1. Acute on chronic systolic CHF: Echo this admission with EF 25-30% with mild LV dilation, severe LVH, moderately decreased RV function/mild RV enlargement, moderate aortic insufficiency. Cause for cardiomyopathy is uncertain.  She had Cardiolite in 9/21 showing inferior and anterior fixed defects with minimal ischemia, possible ischemic cardiomyopathy.  Not cathed given no chest pain and CKD stage IV. Of note, sister had a recent heart transplant (? familial cardiomyopathy).  On exam, CVP 10 with co-ox 77%.  - Continue milrinone 0.125 and Lasix gtt 20 mg/hr.  Continue metolazone 5 mg twice a day.   - Unna boots - Could increase bidil 2 tabs tid, if SBP remains elevated.  - With CKD stage IV, no ARB/ARNI/spironolactone/digoxin.  - Can restart her home Toprol XL 25 mg daily when volume better controlled.  2. CKD stage IV: History of renal [facility]. Baseline Cr around 2.5. Today Cr 3.2 which has been for several days. Making > 3 liters of urine.   - Continue prednisone and tacrolimus.  - Follow creatinine closely with diuresis. - Nephrology was consulted.   3. CAD: Extensive CAD noted on prior chest CT.  She had Cardiolite in 9/21 showing inferior and anterior fixed defects with minimal ischemia, possible ischemic cardiomyopathy.  Not cathed given no chest pain and CKD stage IV. - Continue statin.  - No ASA given apixaban use.  4. Atrial fibrillation w RVR during admission started on amio drip.  - Will need cardioversion if she  doesn't convert.  - Continue apixaban.  - Replete electrolytes PRN.  5. H/o DVT: On apixaban.  6. Type 2 DM.  -Stable on SSI.  7. Anemia: Hgb  8.1, ? due to chronic disease/renal disease.  FOBT was negative.  - s/p 1 PRBC 07/10/2019 -continue to monitor 8. Productive cough  -On presentation required bipap -not on home oxygen -CXR on 1/1 opacity and edema -Continue mucinex BID   Carlene Coria NP 07/12/2020 7:20 AM   Patient seen and examined with the above-signed Advanced Practice  Provider and/or Housestaff. I personally reviewed laboratory data, imaging studies and relevant notes. I independently examined the patient and formulated the important aspects of the plan. I have edited the note to reflect any of my changes or salient points. I have personally discussed the plan with the patient and/or family.   Remains on milrinone and IV amio. Continues to diurese well. Symptoms improving. Co-ox 77%. Remains in NSR with IV amio. No bleeding with Eliquis. Renal function stable.   General:  Obese woman lying in bed  No resp difficulty HEENT: normal Neck: supple JVP to jaw. Carotids 2+ bilat; no bruits. No lymphadenopathy or thryomegaly appreciated. Cor: PMI nondisplaced. Regular rate & rhythm. 2/6 TR Lungs: clear Abdomen: obese soft, nontender, nondistended. No hepatosplenomegaly. No bruits or masses. Good bowel sounds. Extremities: no cyanosis, clubbing, rash, 2+ edema Neuro: alert & orientedx3, cranial nerves grossly intact. moves all 4 extremities w/o difficulty. Affect pleasant  Improving steadily. Weight down 35 pounds. Co-ox and renal function stable. I suspect she has about 20 more pounds to go. Continue current regimen. AF is quiescent on IV amio but has had some NSVT on tele. Continue IV amio. Supp electrolytes.   Glori Bickers, MD  2:31 PM

## 2020-07-12 NOTE — Consult Note (Signed)
Cherylann Banas is a 55 year old female presenting to ED due to respiratory distress. Her past medical history includes HFrEF, paroxysmal atrial fibrillation, and DVT, for which she is on Eliquis, CKD IV status post-renal transplant. She is on chronic immunosuppressants with mycophenolate 540mg  BID and tacrolimus 3mg  BID at home. The team continued these medications in the hospital after admission (07/04/2021) and ordered a tacrolimus concentration yesterday (07/11/2020 @2100 ). Dr. Haroldine Laws started an amiodarone drip due to atrial fibrillation with rapid ventricular response yesterday at 1030, and is she is currently on continuous infusion. The EKG showed QTc 562ms before and 563ms after amiodarone administration. There is a drug-drug interaction between amiodarone and tacrolimus. Per [employer], amiodarone may increase the serum concentration of tacrolimus. Also, tacrolimus may enhance the QTc-prolongation effect of amiodarone. I recommend closely monitoring tacrolimus concentration, assessing the level of tacrolimus when available. A case report describes a 55 year old transplant patient receiving amiodarone who began tacrolimus (7mg /day), yielding the tacrolimus level of 63.0ng/mL. After reducing tacrolimus dosage (2mg /day), the concentration was 12.9ng/mL, back to normal.  Linward Foster, Pharmacist Student

## 2020-07-12 NOTE — Progress Notes (Signed)
Subjective:  [name] is a 55 y.o. with PMH of HFrEF, PAF, CKD4 s/p renal transplant, T2DM, HTN, DVT on eliquis admit for acute on chronic systolic heart failure on hospital day 8   [name] was examined and evaluated at bedside this am. She mentions her diarrhea has improved last night. She states she feels her legs are lighter and she is able to mobilize her lower extremities easier today. Denies CP, palpitations. She endorses decreased hearing recently, which started prior to admission. Denies tinnitus, states it is as if the volume is turned down.  On further questioning on re-evaluation, she mentions that she has had worsening hearing for about 2 weeks without obvious inciting event. She mentions gradual onset diminished hearing, describing voices progressively sounding more 'muffled.' She denies any tinnitus, dizziness, ear pain or drainage.  Objective:  Vital signs in last 24 hours: Vitals:   07/11/20 1945 07/12/20 0009 07/12/20 0036 07/12/20 0402  BP: (!) 153/68  (!) 165/66 (!) 165/66  Pulse: 77  72 74  Resp: 16  19 13   Temp: 98.7 F (37.1 C)  98.6 F (37 C) 98.8 F (37.1 C)  TempSrc: Oral  Oral Oral  SpO2: 100%  100% 97%  Weight:  134.3 kg    Height:       Gen: Well-developed, well nourished, NAD HEENT: NCAT head, diminished hearing bilaterally, worse on L side, no ear tenderness, drainage, internal ear canal without significant findings, tympanic membrane intact, Webber lateralizes to L, Rinne wnl bilaterally CV: RRR, S1, S2 normal, No rubs, no murmurs, no gallops Pulm: Rales bilaterally up to mid thorax Abd: Soft, BS+, Distended w/ + abdominal wall edema Extm: 3+ pitting edema up the thighs, Unna boots in place Skin: Dry, Warm  Assessment/Plan:  Principal Problem:   Acute on chronic heart failure (HCC) Active Problems:   S/p cadaver renal transplant   Paroxysmal atrial fibrillation (HCC)   Chronic kidney disease (CKD), stage IV (severe) (HCC)  [name] is a 55 y.o. with PMH of HFrEF, PAF, CKD4 s/p renal transplant, T2DM, HTN, DVT on eliquis admit for acute on chronic systolic heart failure  Acute hypoxic respiratory failure 2/2 pulmonary edema Acute on chronic combined systolic/diastolic heart failure Continues to have anasarca on exam. Able to diurese net 2.2L out yesterday. Co-ox panel w/o significant change. Current weight 134.3kg, dry weight 104kg K 3.2, Mag 2.0 - Appreciate cardiology recs: c/w milrinone 0.125 and furosemide 20mg  /hr, metolazone 5mg  bid - Appreciate nephrology recs: No RRT for now, monitor - Trend BMP, Mag - Strict I&Os, Daily Weights - Fluid restriction - Keep O2 sat >88 - Replenish K as needed >4.0, Mag as needed >2.0  Progressive hearing loss Complaint of progressive hearing loss bilaterally. Denies tenderness, drainage, vertigo. Unclear etiology. History suggest bilateral gradually worsening hearing loss with onset prior to admission. Exam suggests conductive hearing loss on L side but no obvious cause on visualization. Currently on high dose IV furosemide and would need to monitor closely for ototoxicity. Would benefit from formal audiometry evaluation after discharge. - Monitor for worsening hearing loss - F/u outpatient  CKD4 s/p renal transplant On chronic immunosuppression. Baseline creatinine 2.5. Renal fx stable around 3 since admission, 3.14->3.1->3.05->3.15->3.17->3.18 - Trend renal fx - Avoid nephrotoxic meds when able - C/w home meds: prednisone, tacrolimus  Acute on chronic anemia of chronic disease Required 1unit pRbc on 07/09/20. No evidence of bleed while on anti-coagulation. Hgb stable overnight 7.6->7.4->7.6->8.1 - Trend cbc - Transfuse if  hgb<7  Atrial Fibrillation with RVR CHADSVASC2 score of 4. On anticoagulation with eliquis. Converted from sinus to a.fib with RVR yesterday afternoon with HR up to 140s. Started on amiodarone gtt by cardiology. Current HR 74 in persistent A.fib -  Cardiology considering cardioversion if a.fib persists - Holding beta-blocker in setting of heart failure - Telemetry - C/w eliquis  Diarrhea 2/2 viral enteritis Appear to be resolving after treatment with imodium. Gi panel overnight positive for Sapovirus. Treat symptomatically - C/w imodium  HTN Am bp 165/66. On milrinone. - C/w diuresis as above  T2DM Am cbg 99 - SSI - Glucose checks  DVT prophx: eliquis Diet: CM, renal Code: Full  Prior to Admission Living Arrangement: Home Anticipated Discharge Location: SNF Barriers to Discharge: Medical tx Dispo: Anticipated discharge in approximately 4-5 day(s).   Mosetta Anis, MD 07/12/2020, 6:40 AM Pager: [phone] After 5pm on weekdays and 1pm on weekends: On Call Pager: [phone]

## 2020-07-13 DIAGNOSIS — I5023 Acute on chronic systolic (congestive) heart failure: Secondary | ICD-10-CM | POA: Diagnosis not present

## 2020-07-13 DIAGNOSIS — I480 Paroxysmal atrial fibrillation: Secondary | ICD-10-CM | POA: Diagnosis not present

## 2020-07-13 DIAGNOSIS — I472 Ventricular tachycardia: Secondary | ICD-10-CM | POA: Diagnosis not present

## 2020-07-13 LAB — CBC
HCT: 24.3 % — ABNORMAL LOW (ref 36.0–46.0)
Hemoglobin: 7.3 g/dL — ABNORMAL LOW (ref 12.0–15.0)
MCH: 28.3 pg (ref 26.0–34.0)
MCHC: 30 g/dL (ref 30.0–36.0)
MCV: 94.2 fL (ref 80.0–100.0)
Platelets: 166 10*3/uL (ref 150–400)
RBC: 2.58 MIL/uL — ABNORMAL LOW (ref 3.87–5.11)
RDW: 17 % — ABNORMAL HIGH (ref 11.5–15.5)
WBC: 5.9 10*3/uL (ref 4.0–10.5)
nRBC: 0 % (ref 0.0–0.2)

## 2020-07-13 LAB — COOXEMETRY PANEL
Carboxyhemoglobin: 1.4 % (ref 0.5–1.5)
Carboxyhemoglobin: 1.5 % (ref 0.5–1.5)
Methemoglobin: 1 % (ref 0.0–1.5)
Methemoglobin: 1 % (ref 0.0–1.5)
O2 Saturation: 93.2 %
O2 Saturation: 87 %
Total hemoglobin: 7.6 g/dL — ABNORMAL LOW (ref 12.0–16.0)
Total hemoglobin: 7.6 g/dL — ABNORMAL LOW (ref 12.0–16.0)

## 2020-07-13 LAB — RENAL FUNCTION PANEL
Albumin: 2.6 g/dL — ABNORMAL LOW (ref 3.5–5.0)
Anion gap: 11 (ref 5–15)
BUN: 50 mg/dL — ABNORMAL HIGH (ref 6–20)
CO2: 24 mmol/L (ref 22–32)
Calcium: 6.9 mg/dL — ABNORMAL LOW (ref 8.9–10.3)
Chloride: 108 mmol/L (ref 98–111)
Creatinine, Ser: 3.37 mg/dL — ABNORMAL HIGH (ref 0.44–1.00)
GFR, Estimated: 15 mL/min — ABNORMAL LOW (ref >60)
Glucose, Bld: 103 mg/dL — ABNORMAL HIGH (ref 70–99)
Phosphorus: 4.7 mg/dL — ABNORMAL HIGH (ref 2.5–4.6)
Potassium: 3.9 mmol/L (ref 3.5–5.1)
Sodium: 143 mmol/L (ref 135–145)

## 2020-07-13 LAB — GLUCOSE, CAPILLARY
Glucose-Capillary: 95 mg/dL (ref 70–99)
Glucose-Capillary: 125 mg/dL — ABNORMAL HIGH (ref 70–99)
Glucose-Capillary: 129 mg/dL — ABNORMAL HIGH (ref 70–99)
Glucose-Capillary: 115 mg/dL — ABNORMAL HIGH (ref 70–99)

## 2020-07-13 LAB — MAGNESIUM: Magnesium: 1.9 mg/dL (ref 1.7–2.4)

## 2020-07-13 MED ORDER — MAGNESIUM SULFATE IN D5W 1-5 GM/100ML-% IV SOLN
1.0000 g | Freq: Once | INTRAVENOUS | Status: AC
  Administered 2020-07-13: 1 g via INTRAVENOUS
  Filled 2020-07-13: qty 100

## 2020-07-13 MED ORDER — AMIODARONE HCL 200 MG PO TABS
200.0000 mg | ORAL_TABLET | Freq: Every day | ORAL | Status: DC
  Administered 2020-07-13 – 2020-07-20 (×8): 200 mg via ORAL
  Filled 2020-07-13 (×8): qty 1

## 2020-07-13 MED ORDER — AMIODARONE HCL 200 MG PO TABS: 200.0000 mg | ORAL_TABLET | Freq: Every day | ORAL | Status: DC

## 2020-07-13 NOTE — Progress Notes (Signed)
[facility] Progress Note    Assessment/ Plan:   1.  AKI on CKD stage IV: Cardiorenal syndrome in setting of decompensated CHF. Cr trend up, but overall GFR remains steady with aggressive diuresis.  continuing current course. Net negative ~2 L yesterday, discordant weight 134 kg > 133  - tacro level drawn 07/11/20 pending - continue mycophenolate, tacrolimus, and prednisone  2.  Acute on chronic systolic and diastolic CHF - EF 81-44% with mild LV dilation, severe LVH, moderately decreased RV function/mild RV enlargement, moderate aortic insufficiency.  - AHF following -  On BiDil, Toprolol Xl being held until volume status has improved and patient not a candidate for other GDMT ( ARB/ARNI/spironolactone/digoxin) with CKD stage IV - on milrinone -  metolazone and lasix being held today  3.  Afib  - new  - on amiodarone GGT - regular rate and rhythm on exam  4.  H/o DVT - apixaban   5.  Diarrhea: GI panel + for sapovirus, patient at risk for not clearing virus given immunosuppression. Bowel movements improved. Place enteric precautions. Given improvement , will not decrease immunosuppression at this time. Discussed with Dr.Upton and ID curbsided , have found some success treating Noravirus  ( same family as Sapovirus) with Nitazoxanide. Could consider if diarrhea continues.   6. Acute on chronic anemia - Hgb stable range, 7 to 8 - on ferrous sulfate daily   7.  Dispo: pending  Subjective:    No acute events overnight. Continues to have good urine output. Reports bowel movements have improved.   Objective:   BP (!) 136/58 (BP Location: Right Wrist)   Pulse 74   Temp 98.6 F (37 C) (Oral)   Resp 12   Ht 5\' 11"  (1.803 m)   Wt 133.4 kg   LMP 11/05/2012   SpO2 100%   BMI 41.00 kg/m   Intake/Output Summary (Last 24 hours) at 07/13/2020 0636 Last data filed at 07/13/2020 8185 Gross per 24 hour  Intake 1914.56 ml  Output 4050 ml  Net -2135.44 ml   Weight  change: -0.907 kg  Physical Exam:  General: NAD, pleasant  Cardiovascular: Normal rate, regular rhythm.  No murmurs, rubs, or gallops Pulmonary : decreased breath sound bilaterally , no distress Abdominal: soft, nontender,  bowel sounds present Ext: BL edema, warm  Imaging: No results found.  Labs: BMET Recent Labs  Lab 07/07/20 0254 07/08/20 0031 07/09/20 0405 07/10/20 0728 07/10/20 1529 07/11/20 0530 07/12/20 0302 07/12/20 1305 07/13/20 0401  NA 141 142 143 143 141 142 142 142 143  K 3.6 3.5 3.3* 3.1* 3.2* 3.0* 3.2* 3.3* 3.9  CL 110 110 112* 109 106 109 106 108 108  CO2 19* 20* 20* 21* 22 22 24 23 24   GLUCOSE 92 121* 97 91 113* 89 99 141* 103*  BUN 41* 43* 43* 44* 48* 48* 52* 50* 50*  CREATININE 3.14* 3.10* 3.05* 3.19* 3.15* 3.17* 3.18* 3.28* 3.37*  CALCIUM 6.1* 6.1* 6.2* 6.8* 6.6* 6.7* 6.8* 6.9* 6.9*  PHOS 5.0* 5.2* 5.0* 5.1*  --  4.9* 4.9*  --  4.7*   CBC Recent Labs  Lab 07/10/20 0728 07/11/20 0530 07/12/20 0302 07/13/20 0401  WBC 6.8 6.9 7.2 5.9  HGB 7.4* 7.6* 8.1* 7.3*  HCT 23.4* 23.9* 24.5* 24.3*  MCV 94.0 94.1 92.8 94.2  PLT 165 162 192 166    Medications:    . sodium chloride   Intravenous Once  . apixaban  5 mg Oral BID  .  atorvastatin  40 mg Oral Daily  . calcitRIOL  0.5 mcg Oral Daily  . calcium carbonate  1,000 mg Oral Daily  . calcium-vitamin D  1 tablet Oral BID  . Chlorhexidine Gluconate Cloth  6 each Topical Daily  . DULoxetine  30 mg Oral Daily  . ferrous sulfate  325 mg Oral BID WC  . guaiFENesin  600 mg Oral BID  . insulin aspart  0-5 Units Subcutaneous QHS  . insulin aspart  0-6 Units Subcutaneous TID WC  . isosorbide-hydrALAZINE  1.5 tablet Oral TID  . loperamide  4 mg Oral Daily  . metolazone  5 mg Oral BID  . mycophenolate  540 mg Oral BID  . pantoprazole  40 mg Oral Daily  . potassium chloride  40 mEq Oral TID  . predniSONE  5 mg Oral Q breakfast  . sodium chloride flush  3 mL Intravenous Q12H  . tacrolimus  3 mg Oral  BID  . vitamin B-12  1,000 mcg Oral Daily  . Vitamin D (Ergocalciferol)  50,000 Units Oral Q Sat      Tamsen Snider, MD PGY2 IM Resident 07/13/2020, 6:36 AM   See attending attestation for final recommendations

## 2020-07-13 NOTE — Plan of Care (Signed)
  Problem: Education: Goal: Knowledge of General Education information will improve Description Including pain rating scale, medication(s)/side effects and non-pharmacologic comfort measures Outcome: Progressing   Problem: Health Behavior/Discharge Planning: Goal: Ability to manage health-related needs will improve Outcome: Progressing   

## 2020-07-13 NOTE — Progress Notes (Addendum)
Patient ID: [name], female   DOB: [birth year], 55 y.o.   MRN: [id number]     Advanced Heart Failure Rounding Note  PCP-Cardiologist: No primary care provider on file.   Subjective:    Co-ox 93.2% this morning, repeat co-ox 87%, CVP 11, UOP 4L yesterday.   Today, denies chest pain or shortness of breath.  Cough is improving.  Noticed increase swelling to RUE and redness/irritation to LFA.  Also reports decrease in BMs.   Lab Results  Component Value Date   CREATININE 3.37 (H) 07/13/2020   CREATININE 3.28 (H) 07/12/2020   CREATININE 3.18 (H) 07/12/2020    Objective:   Weight Range: 133.4 kg Body mass index is 41 kg/m.   Vital Signs:   Temp:  [98.1 F (36.7 C)-98.7 F (37.1 C)] 98.6 F (37 C) (01/06 0000) Pulse Rate:  [73-79] 74 (01/06 0000) Resp:  [12-18] 12 (01/06 0000) BP: (136-162)/(58-70) 136/58 (01/06 0000) SpO2:  [96 %-100 %] 100 % (01/06 0000) Weight:  [133.4 kg] 133.4 kg (01/06 0043) Last BM Date: 07/12/20  Weight change: Filed Weights   07/11/20 0535 07/12/20 0009 07/13/20 0043  Weight: (!) 140.2 kg 134.3 kg 133.4 kg    Intake/Output:   Intake/Output Summary (Last 24 hours) at 07/13/2020 0713 Last data filed at 07/13/2020 0605 Gross per 24 hour  Intake 1914.56 ml  Output 4050 ml  Net -2135.44 ml      Physical Exam   CVP 11 General:  Well appearing. No resp difficulty HEENT: Minimal d/c.  Neck: supple. Cor: Regular rate & rhythm. +  murmurs. Lungs: Rales to lower bases.  Abdomen: soft, nontender, nondistended. Good bowel sounds. Extremities: no cyanosis, clubbing, rash.  RUE, BL LE edema.  Erythema noted to L FA.  Neuro: alert & oriented x 3,  moves all 4 extremities w/o difficulty. Affect pleasant    Telemetry: SR with prolong QTc and PVCs.  Personally reviewed.   EKG    N/a   Labs    CBC Recent Labs    07/12/20 0302 07/13/20 0401  WBC 7.2 5.9  HGB 8.1* 7.3*  HCT 24.5* 24.3*  MCV 92.8 94.2  PLT 192 102   Basic Metabolic  Panel Recent Labs    07/12/20 0302 07/12/20 1305 07/13/20 0401  NA 142 142 143  K 3.2* 3.3* 3.9  CL 106 108 108  CO2 24 23 24   GLUCOSE 99 141* 103*  BUN 52* 50* 50*  CREATININE 3.18* 3.28* 3.37*  CALCIUM 6.8* 6.9* 6.9*  MG 2.0 1.9 1.9  PHOS 4.9*  --  4.7*   Liver Function Tests Recent Labs    07/12/20 0302 07/13/20 0401  ALBUMIN 2.5* 2.6*   No results for input(s): LIPASE, AMYLASE in the last 72 hours. Cardiac Enzymes No results for input(s): CKTOTAL, CKMB, CKMBINDEX, TROPONINI in the last 72 hours.  BNP: BNP (last 3 results) Recent Labs    03/14/20 0920 07/04/20 1813  BNP >4,500.0* 7,253.6*    ProBNP (last 3 results) No results for input(s): PROBNP in the last 8760 hours.   D-Dimer No results for input(s): DDIMER in the last 72 hours. Hemoglobin A1C No results for input(s): HGBA1C in the last 72 hours. Fasting Lipid Panel No results for input(s): CHOL, HDL, LDLCALC, TRIG, CHOLHDL, LDLDIRECT in the last 72 hours. Thyroid Function Tests No results for input(s): TSH, T4TOTAL, T3FREE, THYROIDAB in the last 72 hours.  Invalid input(s): FREET3  Other results:   Imaging    No results found.  Medications:     Scheduled Medications: . sodium chloride   Intravenous Once  . apixaban  5 mg Oral BID  . atorvastatin  40 mg Oral Daily  . calcitRIOL  0.5 mcg Oral Daily  . calcium carbonate  1,000 mg Oral Daily  . calcium-vitamin D  1 tablet Oral BID  . Chlorhexidine Gluconate Cloth  6 each Topical Daily  . DULoxetine  30 mg Oral Daily  . ferrous sulfate  325 mg Oral BID WC  . guaiFENesin  600 mg Oral BID  . insulin aspart  0-5 Units Subcutaneous QHS  . insulin aspart  0-6 Units Subcutaneous TID WC  . isosorbide-hydrALAZINE  1.5 tablet Oral TID  . loperamide  4 mg Oral Daily  . metolazone  5 mg Oral BID  . mycophenolate  540 mg Oral BID  . pantoprazole  40 mg Oral Daily  . potassium chloride  40 mEq Oral TID  . predniSONE  5 mg Oral Q breakfast  .  sodium chloride flush  3 mL Intravenous Q12H  . tacrolimus  3 mg Oral BID  . vitamin B-12  1,000 mcg Oral Daily  . Vitamin D (Ergocalciferol)  50,000 Units Oral Q Sat    Infusions: . sodium chloride    . amiodarone 30 mg/hr (07/13/20 0617)  . furosemide (LASIX) 200 mg in dextrose 5% 100 mL (2mg /mL) infusion 20 mg/hr (07/13/20 0617)  . milrinone 0.125 mcg/kg/min (07/13/20 0305)    PRN Medications: sodium chloride, acetaminophen, ondansetron (ZOFRAN) IV, sodium chloride flush    Assessment/Plan    1. Acute on chronic systolic CHF: Echo this admission with EF 25-30% with mild LV dilation, severe LVH, moderately decreased RV function/mild RV enlargement, moderate aortic insufficiency. Cause for cardiomyopathy is uncertain.  She had Cardiolite in 9/21 showing inferior and anterior fixed defects with minimal ischemia, possible ischemic cardiomyopathy.  Not cathed given no chest pain and CKD stage IV. Of note, sister had a recent heart transplant (? familial cardiomyopathy).  On exam, decrease swelling to BL LE, RUE edema, CVP 11.  - Continue milrinone 0.125 and Lasix gtt 20 mg/hr.  Hold metolazone 5 BID due to increase Cr for now will discuss with Dr. Haroldine Laws.  Louretta Parma boots - Could increase bidil 2 tabs tid, if SBP remains elevated above 140.  - With CKD stage IV, no ARB/ARNI/spironolactone/digoxin.  - Can restart her home Toprol XL 25 mg daily when volume better controlled.  2. CKD stage IV: History of renal transplant at [facility]. Baseline Cr around 2.5. Today Cr 3.4, UOP > 4L   - Continue prednisone and tacrolimus.  - Follow creatinine closely with diuresis. - Nephrology was consulted, recommendations appreciated.   3. CAD: Extensive CAD noted on prior chest CT.  She had Cardiolite in 9/21 showing inferior and anterior fixed defects with minimal ischemia, possible ischemic cardiomyopathy.  Not cathed given no chest pain and CKD stage IV. - Continue statin.  - No ASA given apixaban use.   4. Atrial fibrillation w RVR during admission started on amio drip.  - SR today.  - Continue apixaban.  - Replete electrolytes PRN.  5. H/o DVT: On apixaban.  6. Type 2 DM.  -Stable on SSI.  7. Anemia: Hgb  7.3, ? due to chronic disease/renal disease.  FOBT was negative.  - s/p 1 PRBC 07/10/2019 -continue to monitor 8. Productive cough  -On presentation required bipap -not on home oxygen - Reports improvement -CXR on 1/1 opacity and edema -Continue mucinex BID  9.  Upper extremity swelling -Remains on eliquis for RUE DVT; Erythema now present to L FA -Keep upper extremity elevated    Carlene Coria NP 07/13/2020 7:13 AM   Patient seen and examined with the above-signed Advanced Practice Provider and/or Housestaff. I personally reviewed laboratory data, imaging studies and relevant notes. I independently examined the patient and formulated the important aspects of the plan. I have edited the note to reflect any of my changes or salient points. I have personally discussed the plan with the patient and/or family.  Remains on milrinone. Co-ox high. IV lasix stopped this am due to rising creatinine. CVP 13. (checked personally). Denies SOB, orthopnea or PND RUE more swollen  General: lying in bed . No resp difficulty HEENT: normal Neck: supple. JVP to jaw Carotids 2+ bilat; no bruits. No lymphadenopathy or thryomegaly appreciated. Cor: PMI nondisplaced. Regular rate & rhythm. 2/6 TR Lungs: clear Abdomen: obese soft, nontender, nondistended. No hepatosplenomegaly. No bruits or masses. Good bowel sounds. Extremities: no cyanosis, clubbing, rash, 2+ LE edema RUE with 3+ edema. Multiple failed grafts  Neuro: alert & orientedx3, cranial nerves grossly intact. moves all 4 extremities w/o difficulty. Affect pleasant  She remains volume overloaded but now developing AKI. Would hold lasix gtt for now. Continue milrinone. If creatinine improves tomorrow would start torsemide 80 daily and try to  diurese a bit further. D/w Dr. Hollie Salk.  RUE with increased swelling will check RUE u/s and place ACE.   Remains in NSR. Having runs of NSVT. Can switch amio to po. Keep K > 4.0 Mg > 2.0. Continue Eliquis.  Follow TAC level.   Glori Bickers, MD  3:19 PM

## 2020-07-13 NOTE — Progress Notes (Signed)
Occupational Therapy Treatment Patient Details Name: [name] MRN: [id number] DOB: [birth year] Today's Date: 07/13/2020    History of present illness [name] is 55yo female with HFrEF (Echo 03/2020: EF 25-30%), paroxysmal atrial fibrillation, CKD IV s/p renal transplant on chronic immunosuppression, hypertension, type II diabetes mellitus, hx DVT of R axillary vein on Eliquis, GERD admitted 12/28 for acute on chronic heart failure.   OT comments  Patient continues to make incremental progress towards goals in skilled OT session. Patient's session encompassed bed level exercises and education with regard to edema especially in RUE. Pt continues to state pain with all movement, greatly limiting progress. Education provided to patient to elevate RUE on pillow as well as digit flexion and extension and wrist flexion and extension to work fluid back into body (demonstration noted). Discharge remains appropriate, therapy will continue to follow.    Follow Up Recommendations  SNF;Supervision/Assistance - 24 hour    Equipment Recommendations  Wheelchair (measurements OT);Wheelchair cushion (measurements OT);Hospital bed;Other (comment)    Recommendations for Other Services      Precautions / Restrictions Precautions Precautions: Fall Restrictions Weight Bearing Restrictions: No       Mobility Bed Mobility Overal bed mobility: Needs Assistance Bed Mobility: Rolling Rolling: +2 for physical assistance;Max assist            Transfers                      Balance                                           ADL either performed or assessed with clinical judgement   ADL Overall ADL's : Needs assistance/impaired                                       General ADL Comments: Session focus on bed mobility exercises, remains limited due to pain in all extremeties and previous deconditioning     Vision       Perception      Praxis      Cognition Arousal/Alertness: Awake/alert Behavior During Therapy: WFL for tasks assessed/performed Overall Cognitive Status: Difficult to assess                                 General Comments: Appears to understand prompts, but is hard of hearing, presents with flat affect that takes time to warm up to the idea of therapy and increased movement        Exercises General Exercises - Upper Extremity Shoulder Flexion: AAROM;Both;5 reps Shoulder Extension: AAROM;Both;5 reps General Exercises - Lower Extremity Ankle Circles/Pumps: AROM;Both;10 reps Straight Leg Raises: AAROM;Both;5 reps   Shoulder Instructions       General Comments      Pertinent Vitals/ Pain       Pain Assessment: Faces Faces Pain Scale: Hurts whole lot Pain Location: B LEs, whole body with any movement Pain Descriptors / Indicators: Moaning;Grimacing;Guarding Pain Intervention(s): Limited activity within patient's tolerance;Monitored during session;Repositioned  Home Living  Prior Functioning/Environment              Frequency  Min 2X/week        Progress Toward Goals  OT Goals(current goals can now be found in the care plan section)  Progress towards OT goals: Progressing toward goals  Acute Rehab OT Goals Patient Stated Goal: to get stronger OT Goal Formulation: With patient Time For Goal Achievement: 07/20/20 Potential to Achieve Goals: Good  Plan Discharge plan remains appropriate    Co-evaluation                 AM-PAC OT "6 Clicks" Daily Activity     Outcome Measure   Help from another person eating meals?: None Help from another person taking care of personal grooming?: A Little Help from another person toileting, which includes using toliet, bedpan, or urinal?: Total Help from another person bathing (including washing, rinsing, drying)?: A Lot Help from another person to put on and  taking off regular upper body clothing?: A Lot Help from another person to put on and taking off regular lower body clothing?: Total 6 Click Score: 13    End of Session    OT Visit Diagnosis: Pain;Muscle weakness (generalized) (M62.81)   Activity Tolerance Patient limited by fatigue;Patient limited by pain   Patient Left in bed;with call bell/phone within reach   Nurse Communication Mobility status        Time: 1222-4114 OT Time Calculation (min): 14 min  Charges: OT General Charges $OT Visit: 1 Visit OT Treatments $Self Care/Home Management : 8-22 mins  Corinne [place]s [place]. [place], [place] [facility] [place] 07/13/2020, 3:30 PM

## 2020-07-13 NOTE — Progress Notes (Addendum)
CCMD called that patient had a 14 beat run of SVT. Patient is asymptomatic and sleeping. VS are stable. Telemetry reviewed. No abnormal rhythms noted.

## 2020-07-13 NOTE — Progress Notes (Signed)
Orthopedic Tech Progress Note Patient Details:  [name] August 28, 1965 [id number] Spoke with RN the [place] will be tomorrow so ortho will ace wrap tomorrow Ortho Devices Type of Ortho Device: Unna boot Ortho Device/Splint Location: ble Ortho Device/Splint Interventions: Ordered,Application   Post Interventions Patient Tolerated: Well Instructions Provided: Care of device,Poper ambulation with device   [name] 07/13/2020, 6:53 PM

## 2020-07-13 NOTE — Progress Notes (Addendum)
Subjective:  Patient reports had some chest pain previously that has resolved. Reports palpitations are resolving. Some shortness of breath, similar to yesterday. Reports pruritic, erythematous area on left forearm; and tenderness to the lateral RUE. Denies diarrhea.   Objective:  Vital signs in last 24 hours: Vitals:   07/13/20 0000 07/13/20 0043 07/13/20 0805 07/13/20 0917  BP: (!) 136/58  (!) 172/67 (!) 142/63  Pulse: 74  78   Resp: 12  15 16   Temp: 98.6 F (37 C)     TempSrc: Oral  Oral   SpO2: 100%  93% 100%  Weight:  133.4 kg    Height:       Weight change: -0.907 kg  Intake/Output Summary (Last 24 hours) at 07/13/2020 1251 Last data filed at 07/13/2020 0913 Gross per 24 hour  Intake 1794.56 ml  Output 4050 ml  Net -2255.44 ml    GENERAL- Alert, co-operative, appears as stated age. No acute distress. HEENT- Atraumatic, normocephalic. Some crusting to eye.  CARDIAC- Regular rate and rhythm. RESP-Mildly enhanced effort, coarse breath sounds bilaterally ABDOMEN- soft, nontender, distended NEURO- alert and oriented, no focal deficits EXTREMITIES-Patch of Left forearm erythema, Lateral RUE tenderness. Moderate bilateral LE edema, decreased from previous. PSYCH- Normal mood and affect, appropriate thought content and speech.  Assessment/Plan:  [name] is a 55 y.o. with PMH of HFrEF, PAF, CKD4 s/p renal transplant, T2DM, HTN, DVT on eliquis admit for acute on chronic systolic heart failure   Principal Problem:   Acute on chronic heart failure (HCC) Active Problems:   S/p cadaver renal transplant   PAF (paroxysmal atrial fibrillation) (HCC)   Chronic kidney disease (CKD), stage IV (severe) (HCC)   NSVT (nonsustained ventricular tachycardia) (HCC)   Acute hypoxic respiratory failure 2/2 pulmonary edema Acute on chronic combined systolic/diastolic heart failure Continues to have anasarca on exam. Able to diurese net 2.2L out yesterday. Co-ox panel w/o  significant change. Current weight 133.4 kg, dry weight 104kg K 3.9, Mag 1.9. - Appreciate cardiology recs: c/w milrinone 0.125.  Holding IV furosemide and metolazone 5mg  bid in light of rise in creatinine.  - Appreciate nephrology recs: No RRT for now, monitor - Trend BMP, Mag - Strict I&Os, Daily Weights - Fluid restriction - Keep O2 sat >88 - Replenish K as needed >4.0, Mag as needed >2.0   Left forearm Erythema with Associated Pruritis Patient reporting new-onset large pruritic, erythematous patch on the left forearm that she attributes to the tape used. Potential for hematoma vs allergic reaction.  -upper extremity elevation -Monitor for progression. May consider hydrocortisone cream if moderate pruritis   Progressive hearing loss Complaint of progressive hearing loss bilaterally. Denies tenderness, drainage, vertigo. Unclear etiology. History suggest bilateral gradually worsening hearing loss with onset prior to admission. Exam suggests conductive hearing loss on L side but no obvious cause on visualization. Previously on high dose IV furosemide and would need to monitor closely for ototoxicity. Would benefit from formal audiometry evaluation after discharge. - Monitor for worsening hearing loss - F/u outpatient   CKD4 s/p renal transplant On chronic immunosuppression. Baseline creatinine 2.5. Renal fx stable around 3 since admission. 3.14->3.1->3.05->3.15->3.17->3.18-->3.37 - Trend renal fx - Avoid nephrotoxic meds when able - C/w home meds: prednisone, tacrolimus   Acute on chronic anemia of chronic disease Required 1unit pRbc on 07/09/20. No evidence of bleed while on anti-coagulation. Hgb stable overnight 7.6->7.4->7.6->8.1->7.3 - Trend cbc - Transfuse if hgb<7   Atrial Fibrillation with RVR CHADSVASC2 score of 4. On  anticoagulation with eliquis. Started on amiodarone gtt by cardiology. Current HR 78  - Cardiology considering cardioversion if a.fib persists - Holding  beta-blocker in setting of heart failure - Telemetry - C/w eliquis   Diarrhea 2/2 viral enteritis Appear to be resolving with treatment with imodium. Gi panel positive for Sapovirus. Treat symptomatically - C/w imodium   HTN BP 142/63. On milrinone. - C/w diuresis as above   T2DM Am cbg 121 - SSI - Glucose checks   LOS: 9 days   [name], Medical Student 07/13/2020, 12:51 PM

## 2020-07-14 ENCOUNTER — Inpatient Hospital Stay (HOSPITAL_COMMUNITY): Payer: Medicare Other

## 2020-07-14 DIAGNOSIS — M7989 Other specified soft tissue disorders: Secondary | ICD-10-CM

## 2020-07-14 DIAGNOSIS — H9193 Unspecified hearing loss, bilateral: Secondary | ICD-10-CM

## 2020-07-14 DIAGNOSIS — L538 Other specified erythematous conditions: Secondary | ICD-10-CM

## 2020-07-14 DIAGNOSIS — I5023 Acute on chronic systolic (congestive) heart failure: Secondary | ICD-10-CM | POA: Diagnosis not present

## 2020-07-14 DIAGNOSIS — N189 Chronic kidney disease, unspecified: Secondary | ICD-10-CM | POA: Diagnosis not present

## 2020-07-14 DIAGNOSIS — R609 Edema, unspecified: Secondary | ICD-10-CM

## 2020-07-14 DIAGNOSIS — N179 Acute kidney failure, unspecified: Secondary | ICD-10-CM | POA: Diagnosis not present

## 2020-07-14 DIAGNOSIS — L298 Other pruritus: Secondary | ICD-10-CM

## 2020-07-14 LAB — IRON AND TIBC
Iron: 61 ug/dL (ref 28–170)
Saturation Ratios: 28 % (ref 10.4–31.8)
TIBC: 221 ug/dL — ABNORMAL LOW (ref 250–450)
UIBC: 160 ug/dL

## 2020-07-14 LAB — GLUCOSE, CAPILLARY
Glucose-Capillary: 118 mg/dL — ABNORMAL HIGH (ref 70–99)
Glucose-Capillary: 134 mg/dL — ABNORMAL HIGH (ref 70–99)
Glucose-Capillary: 140 mg/dL — ABNORMAL HIGH (ref 70–99)
Glucose-Capillary: 97 mg/dL (ref 70–99)

## 2020-07-14 LAB — RENAL FUNCTION PANEL
Albumin: 2.7 g/dL — ABNORMAL LOW (ref 3.5–5.0)
Anion gap: 13 (ref 5–15)
BUN: 53 mg/dL — ABNORMAL HIGH (ref 6–20)
CO2: 23 mmol/L (ref 22–32)
Calcium: 7.2 mg/dL — ABNORMAL LOW (ref 8.9–10.3)
Chloride: 106 mmol/L (ref 98–111)
Creatinine, Ser: 3.39 mg/dL — ABNORMAL HIGH (ref 0.44–1.00)
GFR, Estimated: 15 mL/min — ABNORMAL LOW (ref >60)
Glucose, Bld: 124 mg/dL — ABNORMAL HIGH (ref 70–99)
Phosphorus: 4.9 mg/dL — ABNORMAL HIGH (ref 2.5–4.6)
Potassium: 3.9 mmol/L (ref 3.5–5.1)
Sodium: 142 mmol/L (ref 135–145)

## 2020-07-14 LAB — COOXEMETRY PANEL
Carboxyhemoglobin: 1.3 % (ref 0.5–1.5)
Methemoglobin: 0.9 % (ref 0.0–1.5)
O2 Saturation: 80 %
Total hemoglobin: 7.5 g/dL — ABNORMAL LOW (ref 12.0–16.0)

## 2020-07-14 LAB — CBC
HCT: 24.6 % — ABNORMAL LOW (ref 36.0–46.0)
Hemoglobin: 7.6 g/dL — ABNORMAL LOW (ref 12.0–15.0)
MCH: 29.3 pg (ref 26.0–34.0)
MCHC: 30.9 g/dL (ref 30.0–36.0)
MCV: 95 fL (ref 80.0–100.0)
Platelets: 178 10*3/uL (ref 150–400)
RBC: 2.59 MIL/uL — ABNORMAL LOW (ref 3.87–5.11)
RDW: 17.2 % — ABNORMAL HIGH (ref 11.5–15.5)
WBC: 6.3 10*3/uL (ref 4.0–10.5)
nRBC: 0 % (ref 0.0–0.2)

## 2020-07-14 LAB — TACROLIMUS LEVEL: Tacrolimus (FK506) - LabCorp: 1.3 ng/mL — ABNORMAL LOW (ref 2.0–20.0)

## 2020-07-14 LAB — FERRITIN: Ferritin: 307 ng/mL (ref 11–307)

## 2020-07-14 LAB — MAGNESIUM: Magnesium: 2.1 mg/dL (ref 1.7–2.4)

## 2020-07-14 MED ORDER — POTASSIUM CHLORIDE CRYS ER 20 MEQ PO TBCR
20.0000 meq | EXTENDED_RELEASE_TABLET | Freq: Once | ORAL | Status: AC
  Administered 2020-07-14: 20 meq via ORAL
  Filled 2020-07-14: qty 1

## 2020-07-14 MED ORDER — TORSEMIDE 20 MG PO TABS
80.0000 mg | ORAL_TABLET | Freq: Every day | ORAL | Status: DC
  Administered 2020-07-14 – 2020-07-20 (×7): 80 mg via ORAL
  Filled 2020-07-14 (×7): qty 4

## 2020-07-14 MED ORDER — TACROLIMUS 1 MG PO CAPS
4.0000 mg | ORAL_CAPSULE | Freq: Two times a day (BID) | ORAL | Status: DC
  Administered 2020-07-14 – 2020-07-19 (×10): 4 mg via ORAL
  Filled 2020-07-14 (×10): qty 4

## 2020-07-14 NOTE — Progress Notes (Signed)
Pt had a 18 bt run of NSVT earlier, no s/s, K 3.9, MD notified, will continue to monitor, Thanks Arvella Nigh RN.

## 2020-07-14 NOTE — Progress Notes (Addendum)
Patient ID: [name], female   DOB: [birth year], 55 y.o.   MRN: [id number]     Advanced Heart Failure Rounding Note  PCP-Cardiologist: No primary care provider on file.   Subjective:    Lasix gtt stopped yesterday due to rising creatinine. Co-ox 80%%, CVP 13  Today, denies CP or shortness of breath.  Reports improvement in BMs.   Denies pain to upper extremities just reports itching to LUE.    Lab Results  Component Value Date   CREATININE 3.39 (H) 07/14/2020   CREATININE 3.37 (H) 07/13/2020   CREATININE 3.28 (H) 07/12/2020    Objective:   Weight Range: 129.7 kg Body mass index is 39.89 kg/m.   Vital Signs:   Temp:  [97.9 F (36.6 C)-98.4 F (36.9 C)] 97.9 F (36.6 C) (01/07 0349) Pulse Rate:  [71-78] 75 (01/07 0349) Resp:  [15-22] 22 (01/07 0640) BP: (121-172)/(63-76) 162/76 (01/07 0640) SpO2:  [93 %-100 %] 100 % (01/07 0640) Weight:  [129.7 kg] 129.7 kg (01/07 0349) Last BM Date: 07/12/20  Weight change: Filed Weights   07/12/20 0009 07/13/20 0043 07/14/20 0349  Weight: 134.3 kg 133.4 kg 129.7 kg    Intake/Output:   Intake/Output Summary (Last 24 hours) at 07/14/2020 0719 Last data filed at 07/14/2020 0400 Gross per 24 hour  Intake 807.91 ml  Output 1250 ml  Net -442.09 ml      Physical Exam   CVP 13 General:  Well appearing.  HEENT: normal Neck: supple. + JVD.  Cor: Regular rate & rhythm. No rubs, gallops. + murmurs. Lungs: Diminished lower lobes.  Abdomen: soft, nontender, nondistended. Good bowel sounds. Extremities: no cyanosis, clubbing, rash. + edema thighs and RUE.  Neuro: alert & oriented x 3, moves all 4 extremities w/o difficulty. Affect pleasant  Telemetry: SR with episodes of NSVT and prolong QT.  Rates 66s.  Personally reviewed.   EKG    N/a   Labs    CBC Recent Labs    07/13/20 0401 07/14/20 0253  WBC 5.9 6.3  HGB 7.3* 7.6*  HCT 24.3* 24.6*  MCV 94.2 95.0  PLT 166 956   Basic Metabolic Panel Recent Labs     07/12/20 1305 07/13/20 0401 07/14/20 0253  NA 142 143 142  K 3.3* 3.9 3.9  CL 108 108 106  CO2 23 24 23   GLUCOSE 141* 103* 124*  BUN 50* 50* 53*  CREATININE 3.28* 3.37* 3.39*  CALCIUM 6.9* 6.9* 7.2*  MG 1.9 1.9  --   PHOS  --  4.7* 4.9*   Liver Function Tests Recent Labs    07/13/20 0401 07/14/20 0253  ALBUMIN 2.6* 2.7*   No results for input(s): LIPASE, AMYLASE in the last 72 hours. Cardiac Enzymes No results for input(s): CKTOTAL, CKMB, CKMBINDEX, TROPONINI in the last 72 hours.  BNP: BNP (last 3 results) Recent Labs    03/14/20 0920 07/04/20 1813  BNP >4,500.0* 2,130.8*    ProBNP (last 3 results) No results for input(s): PROBNP in the last 8760 hours.   D-Dimer No results for input(s): DDIMER in the last 72 hours. Hemoglobin A1C No results for input(s): HGBA1C in the last 72 hours. Fasting Lipid Panel No results for input(s): CHOL, HDL, LDLCALC, TRIG, CHOLHDL, LDLDIRECT in the last 72 hours. Thyroid Function Tests No results for input(s): TSH, T4TOTAL, T3FREE, THYROIDAB in the last 72 hours.  Invalid input(s): FREET3  Other results:   Imaging    No results found.   Medications:  Scheduled Medications: . sodium chloride   Intravenous Once  . amiodarone  200 mg Oral Daily  . apixaban  5 mg Oral BID  . atorvastatin  40 mg Oral Daily  . calcitRIOL  0.5 mcg Oral Daily  . calcium carbonate  1,000 mg Oral Daily  . calcium-vitamin D  1 tablet Oral BID  . Chlorhexidine Gluconate Cloth  6 each Topical Daily  . DULoxetine  30 mg Oral Daily  . ferrous sulfate  325 mg Oral BID WC  . guaiFENesin  600 mg Oral BID  . insulin aspart  0-5 Units Subcutaneous QHS  . insulin aspart  0-6 Units Subcutaneous TID WC  . isosorbide-hydrALAZINE  1.5 tablet Oral TID  . loperamide  4 mg Oral Daily  . mycophenolate  540 mg Oral BID  . pantoprazole  40 mg Oral Daily  . predniSONE  5 mg Oral Q breakfast  . sodium chloride flush  3 mL Intravenous Q12H  .  tacrolimus  3 mg Oral BID  . vitamin B-12  1,000 mcg Oral Daily  . Vitamin D (Ergocalciferol)  50,000 Units Oral Q Sat    Infusions: . sodium chloride    . milrinone 0.125 mcg/kg/min (07/13/20 1305)    PRN Medications: sodium chloride, acetaminophen, ondansetron (ZOFRAN) IV, sodium chloride flush    Assessment/Plan    1. Acute on chronic systolic CHF: Echo this admission with EF 25-30% with mild LV dilation, severe LVH, moderately decreased RV function/mild RV enlargement, moderate aortic insufficiency. Cause for cardiomyopathy is uncertain.  She had Cardiolite in 9/21 showing inferior and anterior fixed defects with minimal ischemia, possible ischemic cardiomyopathy.  Not cathed given no chest pain and CKD stage IV. Of note, sister had a recent heart transplant (? familial cardiomyopathy).  On exam, CVP 13, volume thighs and RUE.   - Continue milrinone 0.125 and held diuretics yesterday due to increase Cr despite being volume overload. Could consider starting torsemide 80 daily when Cr improves, hold off for now.  - Unna boots - Could increase bidil 2 tabs tid, if SBP remains elevated above 140.  - With CKD stage IV, no ARB/ARNI/spironolactone/digoxin.  - Can restart her home Toprol XL 25 mg daily when volume better controlled.  2. CKD stage IV: History of renal transplant at [facility]. Baseline Cr around 2.5. Today Cr 3.4, UOP @ 48mL/kg/h - Continue prednisone and tacrolimus.  - Follow creatinine closely with diuresis. - Nephrology was consulted, recommendations appreciated.   3. CAD: Extensive CAD noted on prior chest CT.  She had Cardiolite in 9/21 showing inferior and anterior fixed defects with minimal ischemia, possible ischemic cardiomyopathy.  Not cathed given no chest pain and CKD stage IV. - Continue statin.  - No ASA given apixaban use.  4. Atrial fibrillation w RVR during admission started on amio drip.  - SR today with NSVT.  - Continue apixaban.  - Replete electrolytes PRN.   5. H/o DVT: On apixaban.  6. Type 2 DM.  -Stable on SSI.  7. Anemia: Hgb  7.6, ? due to chronic disease/renal disease.  FOBT was negative. S/p 1 PRBC 07/10/2019 -continue to monitor 8. Productive cough  -On presentation required bipap -not on home oxygen - Reports improvement -CXR on 1/1 opacity and edema -Continue mucinex BID 9.  Upper extremity swelling -Remains on eliquis for RUE DVT; Erythema now present to L FA -Keep upper extremity elevated  -Check u/s   Carlene Coria NP 07/14/2020 7:19 AM   Patient seen and  examined with the above-signed Advanced Practice Provider and/or Housestaff. I personally reviewed laboratory data, imaging studies and relevant notes. I independently examined the patient and formulated the important aspects of the plan. I have edited the note to reflect any of my changes or salient points. I have personally discussed the plan with the patient and/or family.  Lasix gtt stopped yesterday due to worsening renal function. However remains volume overloaded. CVP 13. Co-ox 80%. Remains in NSR on po amio   General:  Sitting up in bed No resp difficulty HEENT: normal Neck: supple. JVP jaw Carotids 2+ bilat; no bruits. No lymphadenopathy or thryomegaly appreciated. Cor: PMI nondisplaced. Regular rate & rhythm. 2/6 TR Lungs: clear Abdomen: obese soft, nontender, nondistended. No hepatosplenomegaly. No bruits or masses. Good bowel sounds. Extremities: no cyanosis, clubbing, rash, 2+ edema RUE edematous Neuro: alert & orientedx3, cranial nerves grossly intact. moves all 4 extremities w/o difficulty. Affect pleasant  Agree with restarting torsemide 80 daily and watch renal function closely. Increase as tolerated. Can wean milrinone. I suspect this may be as good as we can get her and may need to consider in near future to achieve euvolemia. Continue po amio and Eliquis for AF. RUE u/s pending.   Glori Bickers, MD  3:10 PM

## 2020-07-14 NOTE — Progress Notes (Signed)
[facility] Progress Note   Assessment/ Plan:   1. AKI on CKD stage IV: Cardiorenal syndrome in setting of decompensated CHF.  - . Mild NAGMA.Anasarca. K nl. 1.25 L UO without diuretic yesterday. - Cr stable from yesterday, will monitor with Torsemide today - [place] level 1/4 and 1/6 pending  - recommend holding off on RRT while patient has good urine outpu - Continue mycophenolate, tacrolimus, and prednisone 2.Acute on Chronic systolic heart failure    AHRF -EF 25 to 30% with mild LV dilation, severe LVH, moderately decreased RV function/mild RV enlargement, moderate aortic insufficiency -Coox 80%, CVP 13 - On 4 L [place] -On milrinone, una boots, BiDil. CKDIV , avoiding ARB/ARNI/spironolactone/digoxin - now 129.7kg. Dry weight 104 kg.   - Torsemide 80 mg  3. Anemia: Normocytic anemia. On B12 and ferrous sulfate. Labs do not suggest IDA. Further workup with reticulocytes if needed, but labs suggest anemia of chronic CKD 4. Secondary hyperparathyroidism from CKD, chronic condition - Ca corrected 8.2 today , Phos 4.9 - stopped calcium carbonate - Continue Calcium-Vitamin D 5. Diarrhea  - + Sapovirus, symptoms improving  - reports on bowel movement yesterday ,bristol scale ~5.   See attending attestation for final recommendations  Subjective:   [name] is a 55 y.o. with PMH of HfrEF, PAF, CKD4 s/p renal transplant, T2Dm, HTN, DVT of RUE on Eliquis admitted for acute on chronic heart failure on hospital day 10   In pleasant mood on approach, she is waiting on her grandchildren to FT. Reports one bowel movement overnight. Right arm still swollen,reports [place] was just completed. All questions and concerns addressed.    Objective:   BP (!) 155/72 (BP Location: Right Wrist)   Pulse 78   Temp 98 F (36.7 C) (Oral)   Resp 13   Ht 5\' 11"  (1.803 m)   Wt 129.7 kg   LMP 11/05/2012   SpO2 100%   BMI 39.89 kg/m   Physical Exam: Gen:NAD, pleasant CVS: RRR, no  murmus Resp:coarse rales bilaterally, no wheezing Abd: NT, active bowel sounds  AYT:[id number], RUE>LUE   Labs: BMET Recent Labs  Lab 07/08/20 0031 07/09/20 0405 07/10/20 0728 07/10/20 1529 07/11/20 0530 07/12/20 0302 07/12/20 1305 07/13/20 0401 07/14/20 0253  NA 142 143 143 141 142 142 142 143 142  K 3.5 3.3* 3.1* 3.2* 3.0* 3.2* 3.3* 3.9 3.9  CL 110 112* 109 106 109 106 108 108 106  CO2 20* 20* 21* 22 22 24 23 24 23   GLUCOSE 121* 97 91 113* 89 99 141* 103* 124*  BUN 43* 43* 44* 48* 48* 52* 50* 50* 53*  CREATININE 3.10* 3.05* 3.19* 3.15* 3.17* 3.18* 3.28* 3.37* 3.39*  CALCIUM 6.1* 6.2* 6.8* 6.6* 6.7* 6.8* 6.9* 6.9* 7.2*  PHOS 5.2* 5.0* 5.1*  --  4.9* 4.9*  --  4.7* 4.9*   CBC Recent Labs  Lab 07/11/20 0530 07/12/20 0302 07/13/20 0401 07/14/20 0253  WBC 6.9 7.2 5.9 6.3  HGB 7.6* 8.1* 7.3* 7.6*  HCT 23.9* 24.5* 24.3* 24.6*  MCV 94.1 92.8 94.2 95.0  PLT 162 192 166 178      Medications:    . sodium chloride   Intravenous Once  . amiodarone  200 mg Oral Daily  . apixaban  5 mg Oral BID  . atorvastatin  40 mg Oral Daily  . calcitRIOL  0.5 mcg Oral Daily  . calcium carbonate  1,000 mg Oral Daily  . calcium-vitamin D  1 tablet Oral BID  .  Chlorhexidine Gluconate Cloth  6 each Topical Daily  . DULoxetine  30 mg Oral Daily  . ferrous sulfate  325 mg Oral BID WC  . guaiFENesin  600 mg Oral BID  . insulin aspart  0-5 Units Subcutaneous QHS  . insulin aspart  0-6 Units Subcutaneous TID WC  . isosorbide-hydrALAZINE  1.5 tablet Oral TID  . loperamide  4 mg Oral Daily  . mycophenolate  540 mg Oral BID  . pantoprazole  40 mg Oral Daily  . predniSONE  5 mg Oral Q breakfast  . sodium chloride flush  3 mL Intravenous Q12H  . tacrolimus  3 mg Oral BID  . torsemide  80 mg Oral Daily  . vitamin B-12  1,000 mcg Oral Daily  . Vitamin D (Ergocalciferol)  50,000 Units Oral Q Sat     Tamsen Snider, MD 07/14/2020, 11:45 AM

## 2020-07-14 NOTE — Progress Notes (Signed)
Upper extremity venous RT study completed.  Preliminary results relayed to Evette Doffing, MD.   See CV Proc for preliminary results report.   Darlin Coco, RDMS

## 2020-07-14 NOTE — TOC Progression Note (Signed)
Transition of Care [facility]) - Progression Note    Patient Details  Name: [name] MRN: [id number] Date of Birth: 29-Sep-1965  [facility]) CM/SW Contact  Zenon Mayo, RN Phone Number: 07/14/2020, 4:15 PM  Clinical Narrative:    Diuresing, CVP is coming down, but cret is going up. Has bed at [facility] when ready. TOC team will continue to follow.   Expected Discharge Plan: [place] Barriers to Discharge: Continued Medical Work up  Expected Discharge Plan and Services Expected Discharge Plan: [place] arrangements for the past 2 months: Apartment                                       Social Determinants of Health (SDOH) Interventions    Readmission Risk Interventions No flowsheet data found.

## 2020-07-14 NOTE — Progress Notes (Signed)
Subjective: Pt had a 18 bt run of NSVT earlier in the AM.   Patient reports not having diarrhea. Denies chest pain. Reports improvement to pruritus of Left forearm.   Objective:  Vital signs in last 24 hours: Vitals:   07/14/20 0000 07/14/20 0349 07/14/20 0640 07/14/20 0850  BP: 121/65 (!) 141/74 (!) 162/76 (!) 155/72  Pulse:  75  78  Resp: 16 17 (!) 22 13  Temp: 98.1 F (36.7 C) 97.9 F (36.6 C)  98 F (36.7 C)  TempSrc: Oral Oral  Oral  SpO2: 100% 100% 100%   Weight:  129.7 kg    Height:       Weight change: -3.629 kg  Intake/Output Summary (Last 24 hours) at 07/14/2020 1125 Last data filed at 07/14/2020 4098 Gross per 24 hour  Intake 690.91 ml  Output 1250 ml  Net -559.09 ml    Lab Results  Component Value Date   CREATININE 3.39 (H) 07/14/2020   CREATININE 3.37 (H) 07/13/2020   CREATININE 3.28 (H) 07/12/2020   GENERAL- Alert, co-operative, No acute distress.  HEENT- Atraumatic, normocephalic CARDIAC- RRR, no murmurs, rubs or gallops. RESP-On supplemental oxygen. Normal effort, course sounds in lower bases.  ABDOMEN- soft, nontender, nondistended NEURO- alert and oriented, grossly nonfocal EXTREMITIES- bilateral lower extremity pitting edema. RUE swelling.  PSYCH- Normal mood and affect, appropriate thought content and speech.  Assessment/Plan:  [name] is a 55 y.o. with PMH of HFrEF, PAF, CKD4 s/p renal transplant, T2DM, HTN, DVT on eliquis admit for acute on chronic systolic heart failure  Principal Problem:   Acute on chronic heart failure (HCC) Active Problems:   S/p cadaver renal transplant   PAF (paroxysmal atrial fibrillation) (HCC)   Chronic kidney disease (CKD), stage IV (severe) (HCC)   NSVT (nonsustained ventricular tachycardia) (HCC)  Acute hypoxic respiratory failure 2/2 pulmonary edema Acute on chronic combined systolic/diastolic heart failure Continues to have anasarca on exam. Able to diurese net1.25L out yesterday. Co-ox panel  w/o significant change. Current weight 129.7 kg, dry weight 104kg K 3.9, Mag2.1. Held IV furosemide and metolazone 5mg  bid in light of rise in creatinine yesterday. Creatinine 3.37-->3.39 today  - Appreciate cardiology recs: c/w milrinone 0.125. Started Torsemide 80mg .  - Appreciate nephrology recs: No RRT for now, monitor - Trend BMP, Mag - Strict I&Os, Daily Weights - Fluid restriction - Keep O2 sat >88 - Replenish K as needed >4.0, Mag as needed >2.0  Upper extremity swelling 2/2 RUE DVT Stable. Remains on eliquis for RUE DVT. Upper extremity u/s today essentially unchanged from prior.  -keep elevated    Left forearm Erythema with Associated Pruritis Patient reporting new-onset large pruritic, erythematous patch on the left forearm that she attributes to the tape used. Potential for hematoma vs allergic reaction. Resolving erythema and pruritus.   -upper extremity elevation -Monitor for resolution. May consider hydrocortisone cream if moderate pruritis  Progressive hearing loss Complaint of progressive hearing loss bilaterally. Denies tenderness, drainage, vertigo. Unclear etiology. History suggest bilateral gradually worsening hearing loss with onset prior to admission. Exam suggests conductive hearing loss on L side but no obvious cause on visualization. Previously on high dose IV furosemide and would need to monitor closely for ototoxicity. Would benefit from formal audiometry evaluation after discharge. - Monitor for worsening hearing loss - F/u outpatient  CKD4 s/p renal transplant On chronic immunosuppression. Baseline creatinine 2.5. Renal fx stable around 3 since admission. 3.14->3.1->3.05->3.15->3.17->3.18-->3.37-->3.39 - Trend renal fx - Avoid nephrotoxic meds when able -  C/w home meds: prednisone, tacrolimus  Acute on chronic anemia of chronic disease Required 1unit pRbc on 07/09/20. No evidence of bleed while on anti-coagulation. Hgb stable overnight  7.6->7.4->7.6->8.1->7.3->7.6 - Trend cbc - Transfuse if hgb<7  Atrial Fibrillation with RVR CHADSVASC2 score of 4. On anticoagulation with eliquis. On amiodarone. Current HR 78  -Continue Amiodarone 200mg  - Cardiology considering cardioversion if a.fib persists - Holding beta-blocker in setting of heart failure - Telemetry - C/w eliquis  Diarrhea2/2 viral enteritis Resolving with treatment with imodium. Gi panel positive for Sapovirus. Treat symptomatically - C/w imodium  HTN BP 155/72. On milrinone. - C/w diuresis as above -Appreciate cardiology recommendation: Could increase bidil 2 tabs tid, if SBP remains elevated above 140.   T2DM Am [id number] - SSI - Glucose checks   LOS: 10 days   Azell Der, Medical Student 07/14/2020, 11:25 AM

## 2020-07-14 NOTE — Plan of Care (Signed)
  Problem: Nutrition: Goal: Adequate nutrition will be maintained Outcome: Completed/Met   Problem: Pain Managment: Goal: General experience of comfort will improve Outcome: Completed/Met

## 2020-07-14 NOTE — Progress Notes (Signed)
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 Pt also with diarrhea due to + Sapovirus    PT Comments    Pt making gradual progress today.  She was able to transfer to EOB with max x 2 but was unable to stand.  She sat EOB with supervision for 6 mins.  Continues to be limited by LE pain, edema, and weakness.  Pt with good participation.     Follow Up Recommendations  SNF     Equipment Recommendations  Wheelchair (measurements PT);Wheelchair cushion (measurements PT);Hospital bed;Other (comment);3in1 (PT) (hoyer)    Recommendations for Other Services       Precautions / Restrictions Precautions Precautions: Fall    Mobility  Bed Mobility Overal bed mobility: Needs Assistance Bed Mobility: Rolling;Supine to Sit;Sit to Supine Rolling: +2 for physical assistance;Max assist   Supine to sit: Max assist;+2 for physical assistance Sit to supine: +2 for physical assistance;Max assist   General bed mobility comments: Pt was max A of 2 to sit on EOB - required assist for legs and to lift trunk  Transfers Overall transfer level: Needs assistance               General transfer comment: attempted with max x 2 but unable due to pain/weakness - will need maxi move for OOB  Ambulation/Gait                 Stairs             Wheelchair Mobility    Modified Rankin (Stroke Patients Only)       Balance Overall balance assessment: Needs assistance Sitting-balance support: Feet unsupported;Bilateral upper extremity supported;No upper extremity supported Sitting  balance-Leahy Scale: Fair Sitting balance - Comments: Mostly using UE but was able to sit without support for short time.  Pt sat on EOB for 8 mins with close supervision       Standing balance comment: unable                            Cognition Arousal/Alertness: Awake/alert Behavior During Therapy: WFL for tasks assessed/performed Overall Cognitive Status: Within Functional Limits for tasks assessed                                        Exercises General Exercises - Lower Extremity Ankle Circles/Pumps: AROM;Both;10 reps Long Arc Quad: AAROM;Both;5 reps;Seated    General Comments General comments (skin integrity, edema, etc.): VSS - pt reports some dizziness with sitting but all VSS, eased after a few seconds      Pertinent Vitals/Pain Pain Assessment: Faces Faces Pain Scale: Hurts even more Pain Location: Bil legs/knees with movment Pain Descriptors / Indicators: Grimacing;Moaning Pain Intervention(s): Limited activity within patient's tolerance;Monitored during session;Repositioned    Home Living                      Prior Function            PT Goals (current goals can now be  found in the care plan section) Acute Rehab PT Goals Patient Stated Goal: to get stronger PT Goal Formulation: With patient Time For Goal Achievement: 07/19/20 Potential to Achieve Goals: Fair Progress towards PT goals: Progressing toward goals    Frequency    Min 2X/week      PT Plan Current plan remains appropriate    Co-evaluation              AM-PAC PT "6 Clicks" Mobility   Outcome Measure  Help needed turning from your back to your side while in a flat bed without using bedrails?: Total Help needed moving from lying on your back to sitting on the side of a flat bed without using bedrails?: Total Help needed moving to and from a bed to a chair (including a wheelchair)?: Total Help needed standing up from a chair using your arms  (e.g., wheelchair or bedside chair)?: Total Help needed to walk in hospital room?: Total Help needed climbing 3-5 steps with a railing? : Total 6 Click Score: 6    End of Session Equipment Utilized During Treatment: Oxygen Activity Tolerance: Patient limited by pain Patient left: in bed;with call bell/phone within reach;with bed alarm set Nurse Communication: Mobility status;Need for lift equipment PT Visit Diagnosis: Other abnormalities of gait and mobility (R26.89);Muscle weakness (generalized) (M62.81);Pain Pain - part of body: Leg     Time: 1351-1421 PT Time Calculation (min) (ACUTE ONLY): 30 min  Charges:  $Therapeutic Exercise: 8-22 mins $Therapeutic Activity: 8-22 mins                     Abran Richard, PT Acute Rehab Services Pager [phone] [facility] 07/14/2020, 3:13 PM

## 2020-07-15 DIAGNOSIS — H918X3 Other specified hearing loss, bilateral: Secondary | ICD-10-CM

## 2020-07-15 DIAGNOSIS — J9601 Acute respiratory failure with hypoxia: Secondary | ICD-10-CM

## 2020-07-15 DIAGNOSIS — N189 Chronic kidney disease, unspecified: Secondary | ICD-10-CM | POA: Diagnosis not present

## 2020-07-15 DIAGNOSIS — I5082 Biventricular heart failure: Secondary | ICD-10-CM

## 2020-07-15 DIAGNOSIS — N179 Acute kidney failure, unspecified: Secondary | ICD-10-CM | POA: Diagnosis not present

## 2020-07-15 DIAGNOSIS — D631 Anemia in chronic kidney disease: Secondary | ICD-10-CM

## 2020-07-15 DIAGNOSIS — I5023 Acute on chronic systolic (congestive) heart failure: Secondary | ICD-10-CM | POA: Diagnosis not present

## 2020-07-15 LAB — CBC
HCT: 23.9 % — ABNORMAL LOW (ref 36.0–46.0)
Hemoglobin: 7.2 g/dL — ABNORMAL LOW (ref 12.0–15.0)
MCH: 28.6 pg (ref 26.0–34.0)
MCHC: 30.1 g/dL (ref 30.0–36.0)
MCV: 94.8 fL (ref 80.0–100.0)
Platelets: 169 10*3/uL (ref 150–400)
RBC: 2.52 MIL/uL — ABNORMAL LOW (ref 3.87–5.11)
RDW: 17.1 % — ABNORMAL HIGH (ref 11.5–15.5)
WBC: 5.9 10*3/uL (ref 4.0–10.5)
nRBC: 0 % (ref 0.0–0.2)

## 2020-07-15 LAB — RENAL FUNCTION PANEL
Albumin: 2.7 g/dL — ABNORMAL LOW (ref 3.5–5.0)
Anion gap: 15 (ref 5–15)
BUN: 57 mg/dL — ABNORMAL HIGH (ref 6–20)
CO2: 24 mmol/L (ref 22–32)
Calcium: 7.1 mg/dL — ABNORMAL LOW (ref 8.9–10.3)
Chloride: 104 mmol/L (ref 98–111)
Creatinine, Ser: 3.47 mg/dL — ABNORMAL HIGH (ref 0.44–1.00)
GFR, Estimated: 15 mL/min — ABNORMAL LOW (ref >60)
Glucose, Bld: 89 mg/dL (ref 70–99)
Phosphorus: 4.6 mg/dL (ref 2.5–4.6)
Potassium: 3.2 mmol/L — ABNORMAL LOW (ref 3.5–5.1)
Sodium: 143 mmol/L (ref 135–145)

## 2020-07-15 LAB — COOXEMETRY PANEL
Carboxyhemoglobin: 1.5 % (ref 0.5–1.5)
Methemoglobin: 1 % (ref 0.0–1.5)
O2 Saturation: 79.1 %
Total hemoglobin: 7.4 g/dL — ABNORMAL LOW (ref 12.0–16.0)

## 2020-07-15 LAB — GLUCOSE, CAPILLARY
Glucose-Capillary: 90 mg/dL (ref 70–99)
Glucose-Capillary: 115 mg/dL — ABNORMAL HIGH (ref 70–99)
Glucose-Capillary: 135 mg/dL — ABNORMAL HIGH (ref 70–99)
Glucose-Capillary: 127 mg/dL — ABNORMAL HIGH (ref 70–99)

## 2020-07-15 MED ORDER — LOPERAMIDE HCL 2 MG PO CAPS
4.0000 mg | ORAL_CAPSULE | Freq: Every day | ORAL | Status: DC | PRN
  Administered 2020-07-15: 4 mg via ORAL
  Filled 2020-07-15: qty 2

## 2020-07-15 MED ORDER — POTASSIUM CHLORIDE CRYS ER 20 MEQ PO TBCR
40.0000 meq | EXTENDED_RELEASE_TABLET | Freq: Once | ORAL | Status: AC
  Administered 2020-07-15: 40 meq via ORAL
  Filled 2020-07-15: qty 2

## 2020-07-15 MED ORDER — METOPROLOL SUCCINATE ER 25 MG PO TB24
25.0000 mg | ORAL_TABLET | Freq: Every day | ORAL | Status: DC
  Administered 2020-07-15 – 2020-07-20 (×6): 25 mg via ORAL
  Filled 2020-07-15 (×6): qty 1

## 2020-07-15 MED ORDER — DARBEPOETIN ALFA 60 MCG/0.3ML IJ SOSY
60.0000 ug | PREFILLED_SYRINGE | INTRAMUSCULAR | Status: DC
  Administered 2020-07-15: 60 ug via SUBCUTANEOUS
  Filled 2020-07-15: qty 0.3

## 2020-07-15 NOTE — Progress Notes (Addendum)
Subjective:   [name] states [name] continues to breathe better and was able to lay more flat overnight. [name] is still having pain in her right shoulder area but this is unchanged. No other acute complaints at this time.   Objective:  Vital signs in last 24 hours: Vitals:   07/14/20 1620 07/14/20 2015 07/14/20 2340 07/15/20 0355  BP: (!) 137/56 (!) 140/55 (!) 141/56 (!) 136/52  Pulse: 79 74 79 77  Resp:  12 10 12   Temp:  98.6 F (37 C) 98.1 F (36.7 C) 98.2 F (36.8 C)  TempSrc:  Oral Oral Oral  SpO2:  100% 100% 99%  Weight:      Height:       Physical Exam Vitals and nursing note reviewed.  Constitutional:      General: [name] is not in acute distress.    Appearance: [name] is obese.  HENT:     Head: Normocephalic and atraumatic.  Cardiovascular:     Rate and Rhythm: Normal rate and regular rhythm.     Heart sounds: No murmur heard.   Pulmonary:     Effort: Pulmonary effort is normal. No respiratory distress.     Breath sounds: Decreased breath sounds (throughout) present. No wheezing or rales.  Musculoskeletal:     Right lower leg: Edema (2+ pitting ) present.     Left lower leg: Edema (2+ pitting) present.  Skin:    General: Skin is warm and dry.  Neurological:     General: No focal deficit present.     Mental Status: [name] is alert and oriented to person, place, and time. Mental status is at baseline.  Psychiatric:        Mood and Affect: Mood normal.        Behavior: Behavior normal.    Assessment/Plan:  Principal Problem:   Acute on chronic heart failure (HCC) Active Problems:   Acute kidney injury superimposed on chronic kidney disease (HCC)   S/p cadaver renal transplant   PAF (paroxysmal atrial fibrillation) (HCC)   Chronic kidney disease (CKD), stage IV (severe) (HCC)   NSVT (nonsustained ventricular tachycardia) (HCC)  [name] is a 55 y.o. with PMH of HFrEF, PAF, CKD4 s/p renal transplant, T2DM, HTN, DVT on eliquis admit for acute on chronic  systolic heart failure.   # Acute hypoxic respiratory failure # Acute Exacerbation of Chronic Biventricular Heart Failure  [name] continues to be hypervolemic on examination, although improved compared to admission. Since starting Torsemide 80 mg daily, [name] has diuresed well overnight. No new weights this morning. Heart failure team has started Metoprolol.   - Cardiology following; appreciate their recommendations  - Milrinone per Cardiology - Torsemide 80 mg daily with diuresis goal of 1-2 L per day  - Metoprolol succinate 25 mg daily  - Maintain K > 4.0, Mag > 2.0  - Strict I&Os, Daily Weights - Fluid restriction - Keep O2 sat >88%  # Acute Kidney Injury  # Chronic Kidney Disease Stage 4 # S/p Renal Transplant (2011) AKI secondary to diuresis, however stable at this time. On chronic immunosuppression with Tacrolimus, Mycophenolate and Prednisone.  - Nephrology following; appreciate their recommendations  - Monitor creatinine  - Avoid nephrotoxic meds when able - Continue home Prednisone and Mycophenolate  - Tacrolimus dose increased today to 4 mg BID - Repeat Tacrolimus level pending   # Chronic Normocytic Anemia  Baseline hemoglobin between 7-8 for the past year. On admission hemoglobin of 8 that downtrended to  6.8, requiring 1 unit of pRBCs with appropriate response. No bleeding source identified. No evidence of iron deficiency on iron panel. Most consistent with anemia of chronic disease. Nephrology has started Aranesp.   - Monitor hemoglobin  - Transfuse PRN for hemoglobin < 7.0  - Aranesp per Nephrology   # Atrial Fibrillation with RVR CHADSVASC2 score of 4. Rate controlled at this time.   - Telemetry - Continue Amiodarone 200mg  - Continue Eliquis 5 mg BID   # Acute Gastroenteritis 2/2 Sapovirus Improving.   - Change Imodium to PRN   # Right Upper Extremity DVT First diagnosed in September 2021 with repeat venous duplex this admission showing  persistent right axillary DVT with new SVT in the right cephalic vein. Patient is already anticoagulated with Eliquis; no medication changes indicated.   - Continue Eliquis 5 mg BID   # Hypertension  Blood pressure stable. Currently on Bidil for HFrEF and Metoprolol has been added today.   - Bidil 20-37.5 mg TID - Metoprolol succinate 25 mg daily   # Type 2 Diabetes Mellitus  A1c of 5.6% on admission. Patient has not required any insulin since admission.   - Discontinue SSI - Discontinue Glucose checks  # Progressive hearing loss: Will need outpatient audiology testing and ENT follow up  Prior to Admission Living Arrangement:  Anticipated Discharge Location: SNF Barriers to Discharge: Continued medical evaluation   Dispo: Anticipated discharge in approximately 4-5 day(s).   Dr. Jose Persia Internal Medicine PGY-2  Pager: [phone] After 5pm on weekdays and 1pm on weekends: On Call pager [phone]  07/15/2020, 6:46 AM

## 2020-07-15 NOTE — Progress Notes (Signed)
[facility] Progress Note   Assessment/ Plan:   1. AKI on CKD stage IV: Cardiorenal syndrome in setting of decompensated CHF. Remains volume overloaded. 1.5L of UOP. Crt minimally increased with torsemide 80mg  daily.   -Continues to appear overloaded to me -> would continue gentle diuresis w/ 1-2L negative daily despite Creatinine rise -Tac level 1.3 on 1/4 -> increase tac to 4mg  BID; level today - recommend holding off on RRT while patient has good urine outpu - Continue mycophenolate, tacrolimus, and prednisone 2.Acute on Chronic systolic heart failure w/ hypoxic respiratory failure: EF 25-30%. remains on O2. Advanced HF following -On milrinone, avoiding ARB/ARNI/spironolactone/digoxin -HTN med titration per HF team - continue diuresis as above 3. Anemia of CKD: Normocytic anemia. On B12 and ferrous sulfate. Iron sat 28 and ferritin 307. Aranesp 19mcg today. Hgb 7.2; partially dilutional 4. Secondary hyperparathyroidism from CKD, chronic condition - Ca correct near normal when accounting for albumin 5. Diarrhea  - + Sapovirus, symptoms improving  See attending attestation for final recommendations  Subjective:   [name] is a 55 y.o. with PMH of HfrEF, PAF, CKD4 s/p renal transplant, T2Dm, HTN, DVT of RUE on Eliquis admitted for acute on chronic heart failure on hospital day 11   Patient feels okay today without significant complaints. Continues to urinate. Crt slightly higher today at 3.5. Diarrhea improved.   Objective:   BP (!) 136/52   Pulse 77   Temp 98.2 F (36.8 C) (Oral)   Resp 12   Ht 5\' 11"  (1.803 m)   Wt 129.7 kg   LMP 11/05/2012   SpO2 99%   BMI 39.89 kg/m   Physical Exam: Gen:NAD, pleasant CVS: RRR, no murmus Resp:coarse rales bilaterally, no wheezing Abd: NT, active bowel sounds  SEG:BTDVVOHY, RUE>LUE   Labs: BMET Recent Labs  Lab 07/09/20 0405 07/10/20 0728 07/10/20 1529 07/11/20 0530 07/12/20 0302 07/12/20 1305  07/13/20 0401 07/14/20 0253 07/15/20 0815  NA 143 143 141 142 142 142 143 142 143  K 3.3* 3.1* 3.2* 3.0* 3.2* 3.3* 3.9 3.9 3.2*  CL 112* 109 106 109 106 108 108 106 104  CO2 20* 21* 22 22 24 23 24 23 24   GLUCOSE 97 91 113* 89 99 141* 103* 124* 89  BUN 43* 44* 48* 48* 52* 50* 50* 53* 57*  CREATININE 3.05* 3.19* 3.15* 3.17* 3.18* 3.28* 3.37* 3.39* 3.47*  CALCIUM 6.2* 6.8* 6.6* 6.7* 6.8* 6.9* 6.9* 7.2* 7.1*  PHOS 5.0* 5.1*  --  4.9* 4.9*  --  4.7* 4.9* 4.6   CBC Recent Labs  Lab 07/12/20 0302 07/13/20 0401 07/14/20 0253 07/15/20 0815  WBC 7.2 5.9 6.3 5.9  HGB 8.1* 7.3* 7.6* 7.2*  HCT 24.5* 24.3* 24.6* 23.9*  MCV 92.8 94.2 95.0 94.8  PLT 192 166 178 169      Medications:    . sodium chloride   Intravenous Once  . amiodarone  200 mg Oral Daily  . apixaban  5 mg Oral BID  . atorvastatin  40 mg Oral Daily  . calcitRIOL  0.5 mcg Oral Daily  . calcium-vitamin D  1 tablet Oral BID  . Chlorhexidine Gluconate Cloth  6 each Topical Daily  . DULoxetine  30 mg Oral Daily  . ferrous sulfate  325 mg Oral BID WC  . guaiFENesin  600 mg Oral BID  . insulin aspart  0-5 Units Subcutaneous QHS  . insulin aspart  0-6 Units Subcutaneous TID WC  . isosorbide-hydrALAZINE  1.5 tablet Oral  TID  . loperamide  4 mg Oral Daily  . mycophenolate  540 mg Oral BID  . pantoprazole  40 mg Oral Daily  . predniSONE  5 mg Oral Q breakfast  . sodium chloride flush  3 mL Intravenous Q12H  . tacrolimus  4 mg Oral BID  . torsemide  80 mg Oral Daily  . vitamin B-12  1,000 mcg Oral Daily  . Vitamin D (Ergocalciferol)  50,000 Units Oral Q Sat     Reesa Chew  07/15/2020, 9:48 AM

## 2020-07-15 NOTE — Progress Notes (Signed)
Patient ID: [name], female   DOB: 06-11-1966, 55 y.o.   MRN: [id number]     Advanced Heart Failure Rounding Note  PCP-Cardiologist: No primary care provider on file.   Subjective:    This morning, co-ox 79% on milrinone 0.125.  Creatinine mildly higher at 3.47.  Now on po torsemide, CVP 9-10 on my read.   She says her breathing is better but very weak and was unable to get out of bed to walk.   Hgb lower at 7.2.   Lab Results  Component Value Date   CREATININE 3.47 (H) 07/15/2020   CREATININE 3.39 (H) 07/14/2020   CREATININE 3.37 (H) 07/13/2020    Objective:   Weight Range: 129.7 kg Body mass index is 39.89 kg/m.   Vital Signs:   Temp:  [98.1 F (36.7 C)-98.6 F (37 C)] 98.2 F (36.8 C) (01/08 0355) Pulse Rate:  [74-79] 77 (01/08 0355) Resp:  [10-12] 12 (01/08 0355) BP: (136-141)/(52-56) 136/52 (01/08 0355) SpO2:  [99 %-100 %] 99 % (01/08 0355) Last BM Date: 07/12/20  Weight change: Filed Weights   07/12/20 0009 07/13/20 0043 07/14/20 0349  Weight: 134.3 kg 133.4 kg 129.7 kg    Intake/Output:   Intake/Output Summary (Last 24 hours) at 07/15/2020 1044 Last data filed at 07/15/2020 0650 Gross per 24 hour  Intake 240 ml  Output 1500 ml  Net -1260 ml      Physical Exam   CVP 9-10 General: NAD Neck: JVP 10 cm, no thyromegaly or thyroid nodule.  Lungs: Clear to auscultation bilaterally with normal respiratory effort. CV: Nondisplaced PMI.  Heart regular S1/S2, no S3/S4, no murmur.  No peripheral edema.   Abdomen: Soft, nontender, no hepatosplenomegaly, no distention.  Skin: Intact without lesions or rashes.  Neurologic: Alert and oriented x 3.  Psych: Normal affect. Extremities: No clubbing or cyanosis.  HEENT: Normal.   Telemetry: NSR 70s.  Personally reviewed.   EKG    N/a   Labs    CBC Recent Labs    07/14/20 0253 07/15/20 0815  WBC 6.3 5.9  HGB 7.6* 7.2*  HCT 24.6* 23.9*  MCV 95.0 94.8  PLT 178 244   Basic Metabolic  Panel Recent Labs    07/13/20 0401 07/14/20 0253 07/15/20 0815  NA 143 142 143  K 3.9 3.9 3.2*  CL 108 106 104  CO2 24 23 24   GLUCOSE 103* 124* 89  BUN 50* 53* 57*  CREATININE 3.37* 3.39* 3.47*  CALCIUM 6.9* 7.2* 7.1*  MG 1.9 2.1  --   PHOS 4.7* 4.9* 4.6   Liver Function Tests Recent Labs    07/14/20 0253 07/15/20 0815  ALBUMIN 2.7* 2.7*   No results for input(s): LIPASE, AMYLASE in the last 72 hours. Cardiac Enzymes No results for input(s): CKTOTAL, CKMB, CKMBINDEX, TROPONINI in the last 72 hours.  BNP: BNP (last 3 results) Recent Labs    03/14/20 0920 07/04/20 1813  BNP >4,500.0* 0,102.7*    ProBNP (last 3 results) No results for input(s): PROBNP in the last 8760 hours.   D-Dimer No results for input(s): DDIMER in the last 72 hours. Hemoglobin A1C No results for input(s): HGBA1C in the last 72 hours. Fasting Lipid Panel No results for input(s): CHOL, HDL, LDLCALC, TRIG, CHOLHDL, LDLDIRECT in the last 72 hours. Thyroid Function Tests No results for input(s): TSH, T4TOTAL, T3FREE, THYROIDAB in the last 72 hours.  Invalid input(s): FREET3  Other results:   Imaging    No results found.  Medications:     Scheduled Medications: . sodium chloride   Intravenous Once  . amiodarone  200 mg Oral Daily  . apixaban  5 mg Oral BID  . atorvastatin  40 mg Oral Daily  . calcitRIOL  0.5 mcg Oral Daily  . calcium-vitamin D  1 tablet Oral BID  . Chlorhexidine Gluconate Cloth  6 each Topical Daily  . darbepoetin (ARANESP) injection - NON-DIALYSIS  60 mcg Subcutaneous Q Sat-1800  . DULoxetine  30 mg Oral Daily  . ferrous sulfate  325 mg Oral BID WC  . guaiFENesin  600 mg Oral BID  . insulin aspart  0-5 Units Subcutaneous QHS  . insulin aspart  0-6 Units Subcutaneous TID WC  . isosorbide-hydrALAZINE  1.5 tablet Oral TID  . loperamide  4 mg Oral Daily  . mycophenolate  540 mg Oral BID  . pantoprazole  40 mg Oral Daily  . potassium chloride  40 mEq Oral  Once  . predniSONE  5 mg Oral Q breakfast  . sodium chloride flush  3 mL Intravenous Q12H  . tacrolimus  4 mg Oral BID  . torsemide  80 mg Oral Daily  . vitamin B-12  1,000 mcg Oral Daily  . Vitamin D (Ergocalciferol)  50,000 Units Oral Q Sat    Infusions: . sodium chloride    . milrinone 0.125 mcg/kg/min (07/15/20 0354)    PRN Medications: sodium chloride, acetaminophen, ondansetron (ZOFRAN) IV, sodium chloride flush    Assessment/Plan    1. Acute on chronic systolic CHF: Echo this admission with EF 25-30% with mild LV dilation, severe LVH, moderately decreased RV function/mild RV enlargement, moderate aortic insufficiency. Cause for cardiomyopathy is uncertain.  She had Cardiolite in 9/21 showing inferior and anterior fixed defects with minimal ischemia, possible ischemic cardiomyopathy.  Not cathed given no chest pain and CKD stage IV. Of note, sister had a recent heart transplant (? familial cardiomyopathy).  CVP 9-10 with co-ox 79%.  Creatinine up to 3.47.  - With rise in creatinine and CVP only mildly elevated, will keep her on torsemide 80 mg daily without change today.  - Continue milrinone 0.125, if creatinine starts to trend down tomorrow may stop.  - Unna boots - Continue Bidil 1.5 tabs tid.  - With CKD stage IV, no ARB/ARNI/spironolactone/digoxin.  - Restart home Toprol XL 25 mg daily.  2. CKD stage IV: History of renal [facility] at [facility]. Baseline Cr around 2.5. Today's creatinine mildly higher at 3.47.  - Continue prednisone and tacrolimus.  - Nephrology was consulted, recommendations appreciated.   3. CAD: Extensive CAD noted on prior chest CT.  She had Cardiolite in 9/21 showing inferior and anterior fixed defects with minimal ischemia, possible ischemic cardiomyopathy.  Not cathed given no chest pain and CKD stage IV. - Continue statin.  - No ASA given apixaban use.  4. Atrial fibrillation w RVR during admission started on amio drip. NSR today.  - Continue  apixaban.  - Replete electrolytes PRN.  5. H/o DVT: On apixaban.  6. Type 2 DM.  -Stable on SSI.  7. Anemia: Hgb  7.2, ? due to chronic disease/renal disease.  FOBT was negative. S/p 1 PRBC 07/10/2019 - Transfuse hgb < 7.  8. Upper extremity swelling: Remains on eliquis for right axillary DVT.  -Keep upper extremity elevated   Loralie Champagne, MD  10:44 AM  07/15/2020

## 2020-07-16 DIAGNOSIS — I5043 Acute on chronic combined systolic (congestive) and diastolic (congestive) heart failure: Secondary | ICD-10-CM | POA: Diagnosis not present

## 2020-07-16 LAB — HEMOGLOBIN AND HEMATOCRIT, BLOOD
HCT: 25.9 % — ABNORMAL LOW (ref 36.0–46.0)
Hemoglobin: 7.9 g/dL — ABNORMAL LOW (ref 12.0–15.0)

## 2020-07-16 LAB — RENAL FUNCTION PANEL
Albumin: 2.7 g/dL — ABNORMAL LOW (ref 3.5–5.0)
Anion gap: 11 (ref 5–15)
BUN: 58 mg/dL — ABNORMAL HIGH (ref 6–20)
CO2: 25 mmol/L (ref 22–32)
Calcium: 7.3 mg/dL — ABNORMAL LOW (ref 8.9–10.3)
Chloride: 107 mmol/L (ref 98–111)
Creatinine, Ser: 3.59 mg/dL — ABNORMAL HIGH (ref 0.44–1.00)
GFR, Estimated: 14 mL/min — ABNORMAL LOW (ref >60)
Glucose, Bld: 91 mg/dL (ref 70–99)
Phosphorus: 4.9 mg/dL — ABNORMAL HIGH (ref 2.5–4.6)
Potassium: 3.4 mmol/L — ABNORMAL LOW (ref 3.5–5.1)
Sodium: 143 mmol/L (ref 135–145)

## 2020-07-16 LAB — CBC
HCT: 22.6 % — ABNORMAL LOW (ref 36.0–46.0)
Hemoglobin: 7 g/dL — ABNORMAL LOW (ref 12.0–15.0)
MCH: 29.4 pg (ref 26.0–34.0)
MCHC: 31 g/dL (ref 30.0–36.0)
MCV: 95 fL (ref 80.0–100.0)
Platelets: 151 10*3/uL (ref 150–400)
RBC: 2.38 MIL/uL — ABNORMAL LOW (ref 3.87–5.11)
RDW: 17.1 % — ABNORMAL HIGH (ref 11.5–15.5)
WBC: 5.7 10*3/uL (ref 4.0–10.5)
nRBC: 0 % (ref 0.0–0.2)

## 2020-07-16 LAB — MAGNESIUM: Magnesium: 2 mg/dL (ref 1.7–2.4)

## 2020-07-16 LAB — COOXEMETRY PANEL
Carboxyhemoglobin: 1.5 % (ref 0.5–1.5)
Methemoglobin: 1.1 % (ref 0.0–1.5)
O2 Saturation: 88.3 %
Total hemoglobin: 7.2 g/dL — ABNORMAL LOW (ref 12.0–16.0)

## 2020-07-16 LAB — TACROLIMUS LEVEL: Tacrolimus (FK506) - LabCorp: 1.9 ng/mL — ABNORMAL LOW (ref 2.0–20.0)

## 2020-07-16 LAB — PREPARE RBC (CROSSMATCH)

## 2020-07-16 MED ORDER — DIPHENHYDRAMINE-ZINC ACETATE 2-0.1 % EX CREA
TOPICAL_CREAM | Freq: Every day | CUTANEOUS | Status: AC | PRN
  Filled 2020-07-16: qty 28

## 2020-07-16 MED ORDER — POTASSIUM CHLORIDE CRYS ER 20 MEQ PO TBCR: 40.0000 meq | EXTENDED_RELEASE_TABLET | Freq: Two times a day (BID) | ORAL | Status: DC

## 2020-07-16 MED ORDER — POTASSIUM CHLORIDE CRYS ER 20 MEQ PO TBCR
40.0000 meq | EXTENDED_RELEASE_TABLET | Freq: Two times a day (BID) | ORAL | Status: DC
  Administered 2020-07-16 – 2020-07-20 (×9): 40 meq via ORAL
  Filled 2020-07-16 (×9): qty 2

## 2020-07-16 MED ORDER — SODIUM CHLORIDE 0.9% IV SOLUTION: Freq: Once | INTRAVENOUS | Status: AC

## 2020-07-16 NOTE — Progress Notes (Addendum)
Subjective: Patient reports some palpitations overnight that have resolved; denies chest pain. Breathing stable. Denies abdominal pain, and remains without diarrhea. Endorses continued pruritis to left forearm, though improving.   Objective:  Vital signs in last 24 hours: Vitals:   07/16/20 0126 07/16/20 0434 07/16/20 0724 07/16/20 0953  BP: (!) 146/67 (!) 144/58 (!) 154/65 (!) 157/73  Pulse: 69 74 72 84  Resp: 14 15 19    Temp: 98.5 F (36.9 C) 98.1 F (36.7 C) 97.7 F (36.5 C)   TempSrc: Oral Oral Oral   SpO2: 100% 98% 100%   Weight: 128.8 kg     Height:       Weight change:   Intake/Output Summary (Last 24 hours) at 07/16/2020 1121 Last data filed at 07/16/2020 0914 Gross per 24 hour  Intake 580 ml  Output 875 ml  Net -295 ml    CMP Latest Ref Rng & Units 07/16/2020 07/15/2020 07/14/2020  Glucose 70 - 99 mg/dL 91 89 124(H)  BUN 6 - 20 mg/dL 58(H) 57(H) 53(H)  Creatinine 0.44 - 1.00 mg/dL 3.59(H) 3.47(H) 3.39(H)  Sodium 135 - 145 mmol/L 143 143 142  Potassium 3.5 - 5.1 mmol/L 3.4(L) 3.2(L) 3.9  Chloride 98 - 111 mmol/L 107 104 106  CO2 22 - 32 mmol/L 25 24 23   Calcium 8.9 - 10.3 mg/dL 7.3(L) 7.1(L) 7.2(L)  Total Protein 6.5 - 8.1 g/dL - - -  Total Bilirubin 0.3 - 1.2 mg/dL - - -  Alkaline Phos 38 - 126 U/L - - -  AST 15 - 41 U/L - - -  ALT 0 - 44 U/L - - -   GENERAL- Alert, co-operative, No acute distress. Seen with nasal cannula on forehead.  HEENT- Atraumatic, normocephalic CARDIAC- RRR, no murmurs, rubs or gallops. RESP-Breathing comfortably on RA. Normal effort, coarse breath sounds bilaterally ABDOMEN- soft, nontender, nondistended NEURO- alert and oriented, grossly nonfocal EXTREMITIES-symmetric pitting edema, improved from prior exams. Superficial Excoriations to LUE; Resolving erythema to Left forearm, with some hyperpigmentation.  SKIN- Warm, dry, No rash or lesion. PSYCH- Normal mood and affect, appropriate thought content and  speech.   Assessment/Plan:  Principal Problem:   Acute on chronic heart failure (HCC) Active Problems:   Acute kidney injury superimposed on chronic kidney disease ([name])   S/p cadaver renal transplant   PAF (paroxysmal atrial fibrillation) (HCC)   Chronic kidney disease (CKD), stage IV (severe) (HCC)   NSVT (nonsustained ventricular tachycardia) (HCC)  [name] is a 55 y.o. with PMH of HFrEF, PAF, CKD4 s/p renal transplant, T2DM, HTN, DVT on eliquis admit for acute on chronic systolic heart failure.   # Acute hypoxic respiratory failure # Acute Exacerbation of Chronic Biventricular Heart Failure  [name] continues to be hypervolemic on examination, although improved compared to admission. Since starting Torsemide 80 mg daily, she has diuresed well overnight. No new weights this morning. Heart failure team had started Metoprolol.   - Cardiology following; appreciate their recommendations  - Milrinone per Cardiology - Torsemide 80 mg daily with diuresis goal of 1-2 L per day  - Metoprolol succinate 25 mg daily  - Maintain K > 4.0, Mag > 2.0  - Strict I&Os, Daily Weights - Fluid restriction - Keep O2 sat >88%  # Acute Kidney Injury  # Chronic Kidney Disease Stage 4 # S/p Renal Transplant (2011) Tacrolimus dose increased yesterday to 4 mg BID. Notable elevation in creatine from 3.47->3.59 today. AKI secondary to diuresis; with potential contribution of tacrolimus dose  increase.  On chronic immunosuppression with Tacrolimus, Mycophenolate and Prednisone.  - Nephrology following; appreciate their recommendations  - Monitor creatinine  - Avoid nephrotoxic meds when able - Continue home Prednisone and Mycophenolate  - Tacrolimus 4 mg BID - Repeat Tacrolimus level pending   # Chronic Normocytic Anemia  Baseline hemoglobin between 7-8 for the past year. On admission hemoglobin of 8 that downtrended to 6.8, requiring 1 unit of pRBCs with appropriate response. No bleeding  source identified. No evidence of iron deficiency on iron panel. Most consistent with anemia of chronic kidney disease. Nephrology has started Aranesp. Hgb of 7 today; patient asymptomatic. Transfusion scheduled.   - Monitor hemoglobin  - Transfuse PRN for hemoglobin < 7.0.   - Aranesp per Nephrology   #Left forearm pruritus 2/2 IV Adhesive Patient with stable patch of resolving erythema; and associated pruritus to left upper extremity that is improving. Multiple superficial excoriations on exam today. -Topical benadryl to Left forearm PRN x 3days   # Atrial Fibrillation with RVR CHADSVASC2 score of 4. Controlled at this time.   - Telemetry - Continue Amiodarone 200mg  - Continue Eliquis 5 mg BID   # Acute Gastroenteritis 2/2 Sapovirus Improved. Patient continues without diarrhea.   - Imodium PRN    # Right Upper Extremity DVT First diagnosed in September 2021 with repeat venous duplex this admission showing persistent right axillary DVT with new SVT in the right cephalic vein. Patient is already anticoagulated with Eliquis; no medication changes indicated.   - Continue Eliquis 5 mg BID   # Hypertension  Blood pressure stable. Currently on Bidil for HFrEF and Metoprolol had been added.   - Bidil 20-37.5 mg TID - Metoprolol succinate 25 mg daily   # Type 2 Diabetes Mellitus  A1c of 5.6% on admission. Patient has not required any insulin since admission. SSI and glucose checks were subsequently discontinued.   # Progressive hearing loss: Will need outpatient audiology testing and ENT follow up    LOS: 12 days   [name], Medical Student 07/16/2020, 11:21 AM    Attestation for Student Documentation:  I personally was present and performed or re-performed the history, physical exam and medical decision-making activities of this service and have verified that the service and findings are accurately documented in the student's note.  Tracey Servidio N,  DO 07/16/2020, 12:57 PM

## 2020-07-16 NOTE — Progress Notes (Signed)
[facility] Progress Note   Assessment/ Plan:   1. AKI on CKD stage IV: Cardiorenal syndrome in setting of decompensated CHF.  Volume overload improving.  Urine output continues to be adequate.  Creatinine continues to minimally increase. -Continue torsemide 80 mg daily -Tac level 1.3 on 1/4 -> increased tac to 4mg  BID on 1/8, f/u repeat level -No need for RRT at this time - Continue mycophenolate, tacrolimus, and prednisone 2.Acute on Chronic systolic heart failure w/ hypoxic respiratory failure: EF 25-30%. remains on O2. Advanced HF following -On milrinone, avoiding ARB/ARNI/spironolactone/digoxin -HTN med titration per HF team - continue diuresis as above 3. Anemia of CKD: Normocytic anemia. On B12 and ferrous sulfate. Iron sat 28 and ferritin 307. Aranesp 12mcg given on 1/8. Hgb 7 4. Secondary hyperparathyroidism from CKD, chronic condition - Ca correct near normal when accounting for albumin 5. Diarrhea  - + Sapovirus, diarrhea resolved  See attending attestation for final recommendations  Subjective:   [name] is a 55 y.o. with PMH of HfrEF, PAF, CKD4 s/p renal transplant, T2Dm, HTN, DVT of RUE on Eliquis admitted for acute on chronic heart failure on hospital day 12   Patient feels well today without complaint.  Continues to urinate well.  Creatinine slightly higher again today.  Feels that shortness of breath is improved   Objective:   BP (!) 157/73   Pulse 84   Temp 97.7 F (36.5 C) (Oral)   Resp 19   Ht 5\' 11"  (1.803 m)   Wt 128.8 kg   LMP 11/05/2012   SpO2 100%   BMI 39.61 kg/m   Physical Exam: Gen:NAD, pleasant CVS: RRR, no murmus Resp:coarse rales bilaterally, no wheezing Abd: NT, active bowel sounds  Ext: Bilateral lower extremity edema present, warm and well perfused  Labs: BMET Recent Labs  Lab 07/10/20 0728 07/10/20 1529 07/11/20 0530 07/12/20 0302 07/12/20 1305 07/13/20 0401 07/14/20 0253 07/15/20 0815 07/16/20 0423   NA 143   < > 142 142 142 143 142 143 143  K 3.1*   < > 3.0* 3.2* 3.3* 3.9 3.9 3.2* 3.4*  CL 109   < > 109 106 108 108 106 104 107  CO2 21*   < > 22 24 23 24 23 24 25   GLUCOSE 91   < > 89 99 141* 103* 124* 89 91  BUN 44*   < > 48* 52* 50* 50* 53* 57* 58*  CREATININE 3.19*   < > 3.17* 3.18* 3.28* 3.37* 3.39* 3.47* 3.59*  CALCIUM 6.8*   < > 6.7* 6.8* 6.9* 6.9* 7.2* 7.1* 7.3*  PHOS 5.1*  --  4.9* 4.9*  --  4.7* 4.9* 4.6 4.9*   < > = values in this interval not displayed.   CBC Recent Labs  Lab 07/13/20 0401 07/14/20 0253 07/15/20 0815 07/16/20 0423  WBC 5.9 6.3 5.9 5.7  HGB 7.3* 7.6* 7.2* 7.0*  HCT 24.3* 24.6* 23.9* 22.6*  MCV 94.2 95.0 94.8 95.0  PLT 166 178 169 151      Medications:    . sodium chloride   Intravenous Once  . amiodarone  200 mg Oral Daily  . apixaban  5 mg Oral BID  . atorvastatin  40 mg Oral Daily  . calcitRIOL  0.5 mcg Oral Daily  . calcium-vitamin D  1 tablet Oral BID  . Chlorhexidine Gluconate Cloth  6 each Topical Daily  . darbepoetin (ARANESP) injection - NON-DIALYSIS  60 mcg Subcutaneous Q Sat-1800  . DULoxetine  30 mg Oral Daily  . ferrous sulfate  325 mg Oral BID WC  . guaiFENesin  600 mg Oral BID  . isosorbide-hydrALAZINE  1.5 tablet Oral TID  . metoprolol succinate  25 mg Oral Daily  . mycophenolate  540 mg Oral BID  . pantoprazole  40 mg Oral Daily  . potassium chloride  40 mEq Oral BID  . predniSONE  5 mg Oral Q breakfast  . sodium chloride flush  3 mL Intravenous Q12H  . tacrolimus  4 mg Oral BID  . torsemide  80 mg Oral Daily  . vitamin B-12  1,000 mcg Oral Daily  . Vitamin D (Ergocalciferol)  50,000 Units Oral Q Sat     Reesa Chew  07/16/2020, 10:09 AM

## 2020-07-16 NOTE — Progress Notes (Signed)
Patient ID: [name], female   DOB: 1965-08-27, 55 y.o.   MRN: [id number]     Advanced Heart Failure Rounding Note  PCP-Cardiologist: No primary care provider on file.   Subjective:    This morning, co-ox 88% on milrinone 0.125.  Creatinine mildly higher at 3.47 => 3.59.  Now on po torsemide, CVP 9 on my read.   Only 1 bowel movement overnight, improved.  Still has not gotten out of bed.   Hgb lower at 7.0  Lab Results  Component Value Date   CREATININE 3.59 (H) 07/16/2020   CREATININE 3.47 (H) 07/15/2020   CREATININE 3.39 (H) 07/14/2020    Objective:   Weight Range: 128.8 kg Body mass index is 39.61 kg/m.   Vital Signs:   Temp:  [97.7 F (36.5 C)-99 F (37.2 C)] 97.7 F (36.5 C) (01/09 0724) Pulse Rate:  [69-84] 84 (01/09 0953) Resp:  [11-19] 19 (01/09 0724) BP: (124-162)/(57-73) 157/73 (01/09 0953) SpO2:  [98 %-100 %] 100 % (01/09 0724) Weight:  [128.8 kg] 128.8 kg (01/09 0126) Last BM Date: 07/16/19  Weight change: Filed Weights   07/13/20 0043 07/14/20 0349 07/16/20 0126  Weight: 133.4 kg 129.7 kg 128.8 kg    Intake/Output:   Intake/Output Summary (Last 24 hours) at 07/16/2020 1003 Last data filed at 07/16/2020 0914 Gross per 24 hour  Intake 580 ml  Output 1475 ml  Net -895 ml      Physical Exam   CVP 9 General: NAD Neck: JVP 9 cm, no thyromegaly or thyroid nodule.  Lungs: Clear to auscultation bilaterally with normal respiratory effort. CV: Nondisplaced PMI.  Heart regular S1/S2, no S3/S4, no murmur.  Trace ankle edema. Abdomen: Soft, nontender, no hepatosplenomegaly, no distention.  Skin: Intact without lesions or rashes.  Neurologic: Alert and oriented x 3.  Psych: Normal affect. Extremities: No clubbing or cyanosis.  HEENT: Normal.   Telemetry: NSR 70s.  Personally reviewed.   EKG    N/a   Labs    CBC Recent Labs    07/15/20 0815 07/16/20 0423  WBC 5.9 5.7  HGB 7.2* 7.0*  HCT 23.9* 22.6*  MCV 94.8 95.0  PLT 169 962    Basic Metabolic Panel Recent Labs    07/14/20 0253 07/15/20 0815 07/16/20 0423  NA 142 143 143  K 3.9 3.2* 3.4*  CL 106 104 107  CO2 23 24 25   GLUCOSE 124* 89 91  BUN 53* 57* 58*  CREATININE 3.39* 3.47* 3.59*  CALCIUM 7.2* 7.1* 7.3*  MG 2.1  --  2.0  PHOS 4.9* 4.6 4.9*   Liver Function Tests Recent Labs    07/15/20 0815 07/16/20 0423  ALBUMIN 2.7* 2.7*   No results for input(s): LIPASE, AMYLASE in the last 72 hours. Cardiac Enzymes No results for input(s): CKTOTAL, CKMB, CKMBINDEX, TROPONINI in the last 72 hours.  BNP: BNP (last 3 results) Recent Labs    03/14/20 0920 07/04/20 1813  BNP >4,500.0* 8,366.2*    ProBNP (last 3 results) No results for input(s): PROBNP in the last 8760 hours.   D-Dimer No results for input(s): DDIMER in the last 72 hours. Hemoglobin A1C No results for input(s): HGBA1C in the last 72 hours. Fasting Lipid Panel No results for input(s): CHOL, HDL, LDLCALC, TRIG, CHOLHDL, LDLDIRECT in the last 72 hours. Thyroid Function Tests No results for input(s): TSH, T4TOTAL, T3FREE, THYROIDAB in the last 72 hours.  Invalid input(s): FREET3  Other results:   Imaging    No results  found.   Medications:     Scheduled Medications: . sodium chloride   Intravenous Once  . amiodarone  200 mg Oral Daily  . apixaban  5 mg Oral BID  . atorvastatin  40 mg Oral Daily  . calcitRIOL  0.5 mcg Oral Daily  . calcium-vitamin D  1 tablet Oral BID  . Chlorhexidine Gluconate Cloth  6 each Topical Daily  . darbepoetin (ARANESP) injection - NON-DIALYSIS  60 mcg Subcutaneous Q Sat-1800  . DULoxetine  30 mg Oral Daily  . ferrous sulfate  325 mg Oral BID WC  . guaiFENesin  600 mg Oral BID  . isosorbide-hydrALAZINE  1.5 tablet Oral TID  . metoprolol succinate  25 mg Oral Daily  . mycophenolate  540 mg Oral BID  . pantoprazole  40 mg Oral Daily  . potassium chloride  40 mEq Oral BID  . predniSONE  5 mg Oral Q breakfast  . sodium chloride flush  3  mL Intravenous Q12H  . tacrolimus  4 mg Oral BID  . torsemide  80 mg Oral Daily  . vitamin B-12  1,000 mcg Oral Daily  . Vitamin D (Ergocalciferol)  50,000 Units Oral Q Sat    Infusions: . sodium chloride    . milrinone 0.125 mcg/kg/min (07/15/20 2052)    PRN Medications: sodium chloride, acetaminophen, loperamide, ondansetron (ZOFRAN) IV, sodium chloride flush    Assessment/Plan    1. Acute on chronic systolic CHF: Echo this admission with EF 25-30% with mild LV dilation, severe LVH, moderately decreased RV function/mild RV enlargement, moderate aortic insufficiency. Cause for cardiomyopathy is uncertain.  She had Cardiolite in 9/21 showing inferior and anterior fixed defects with minimal ischemia, possible ischemic cardiomyopathy.  Not cathed given no chest pain and CKD stage IV. Of note, sister had a recent heart transplant (? familial cardiomyopathy).  CVP 9 with co-ox ?88%.  Creatinine up to 3.59, mild rise.  - Continue torsemide 80 mg daily without change today.  - Continue milrinone 0.125, if creatinine starts to trend down tomorrow may stop.  - Unna boots - Continue Bidil 1.5 tabs tid.  - With CKD stage IV, no ARB/ARNI/spironolactone/digoxin.  - Continue Toprol XL 25 mg daily.  2. CKD stage IV: History of renal transplant at [facility]. Baseline Cr around 2.5. Today's creatinine mildly higher at 3.59.  - Continue prednisone and tacrolimus.  - Nephrology was consulted, recommendations appreciated.   3. CAD: Extensive CAD noted on prior chest CT.  She had Cardiolite in 9/21 showing inferior and anterior fixed defects with minimal ischemia, possible ischemic cardiomyopathy.  Not cathed given no chest pain and CKD stage IV. - Continue statin.  - No ASA given apixaban use.  4. Atrial fibrillation w RVR during admission started on amio drip. NSR today.  - Continue apixaban.  - Replete electrolytes PRN.  5. H/o DVT: On apixaban.  6. Type 2 DM.  -Stable on SSI.  7. Anemia: Hgb  7.0, ?  due to chronic disease/renal disease.  FOBT was negative. S/p 1 PRBC 07/10/2019 - Transfuse hgb < 7, likely will need transfusion soon.  8. Upper extremity swelling: Remains on eliquis for right axillary DVT.  -Keep upper extremity elevated   Need to mobilize.   Loralie Champagne, MD  10:03 AM  07/16/2020

## 2020-07-17 DIAGNOSIS — I5023 Acute on chronic systolic (congestive) heart failure: Secondary | ICD-10-CM | POA: Diagnosis not present

## 2020-07-17 DIAGNOSIS — N189 Chronic kidney disease, unspecified: Secondary | ICD-10-CM | POA: Diagnosis not present

## 2020-07-17 DIAGNOSIS — N179 Acute kidney failure, unspecified: Secondary | ICD-10-CM | POA: Diagnosis not present

## 2020-07-17 LAB — COOXEMETRY PANEL
Carboxyhemoglobin: 1.6 % — ABNORMAL HIGH (ref 0.5–1.5)
Methemoglobin: 1.2 % (ref 0.0–1.5)
O2 Saturation: 81 %
Total hemoglobin: 7.8 g/dL — ABNORMAL LOW (ref 12.0–16.0)

## 2020-07-17 LAB — BPAM RBC
Blood Product Expiration Date: 202201272359
ISSUE DATE / TIME: 202201091538
Unit Type and Rh: 6200

## 2020-07-17 LAB — CBC
HCT: 24.4 % — ABNORMAL LOW (ref 36.0–46.0)
Hemoglobin: 7.5 g/dL — ABNORMAL LOW (ref 12.0–15.0)
MCH: 29.1 pg (ref 26.0–34.0)
MCHC: 30.7 g/dL (ref 30.0–36.0)
MCV: 94.6 fL (ref 80.0–100.0)
Platelets: 171 10*3/uL (ref 150–400)
RBC: 2.58 MIL/uL — ABNORMAL LOW (ref 3.87–5.11)
RDW: 16.8 % — ABNORMAL HIGH (ref 11.5–15.5)
WBC: 6.1 10*3/uL (ref 4.0–10.5)
nRBC: 0 % (ref 0.0–0.2)

## 2020-07-17 LAB — TYPE AND SCREEN
ABO/RH(D): A POS
Antibody Screen: NEGATIVE
Unit division: 0

## 2020-07-17 LAB — RENAL FUNCTION PANEL
Albumin: 2.6 g/dL — ABNORMAL LOW (ref 3.5–5.0)
Anion gap: 13 (ref 5–15)
BUN: 61 mg/dL — ABNORMAL HIGH (ref 6–20)
CO2: 24 mmol/L (ref 22–32)
Calcium: 7.3 mg/dL — ABNORMAL LOW (ref 8.9–10.3)
Chloride: 105 mmol/L (ref 98–111)
Creatinine, Ser: 3.58 mg/dL — ABNORMAL HIGH (ref 0.44–1.00)
GFR, Estimated: 14 mL/min — ABNORMAL LOW (ref >60)
Glucose, Bld: 98 mg/dL (ref 70–99)
Phosphorus: 4.9 mg/dL — ABNORMAL HIGH (ref 2.5–4.6)
Potassium: 3.4 mmol/L — ABNORMAL LOW (ref 3.5–5.1)
Sodium: 142 mmol/L (ref 135–145)

## 2020-07-17 LAB — MAGNESIUM: Magnesium: 1.9 mg/dL (ref 1.7–2.4)

## 2020-07-17 NOTE — Progress Notes (Signed)
Removed Foley per order. Will monitor pt's output.

## 2020-07-17 NOTE — Progress Notes (Signed)
[facility] Progress Note   Assessment/ Plan:   1. AKI on CKD stage IV: Cardiorenal syndrome in setting of decompensated CHF. Baseline Cr estimated to be .7 at outpatient visit 04/2020. Recent review of Cr suggest baseline ~2.5.  - Cr stable today - Continues to have adequate urine output with diuretics and support with milrinone  (output 2.4 L, net ~1L).  -Tacro level 1.3 on 1/4, 1.9 on 1/6.  Tacrolimus increased to 4 mg twice daily on 1/8, 1/8 level pending - Continue diuresis , IV Torsemide 80 mg daily  2.Acute on chronic systolic heart failure with hypoxic respiratory failure - CVP improved, 7 today. Oxygen has been weaned down over the weekend. JVP nl. Continues to have pitting edema with albumin ~2.6. Bed weights and diuresis discordant. Overall impression is she is getting adequate diuresis. Mobilization with PT/OT will help improve further extravascular fluid removal.  - On milrinone, BiDil, Toprol XL. Avoiding ARB/Arni/Spironolactone/digoxin. On PO furosemide 80 mg at home.  - IV Torsemide 80 mg daily currently  3. Anemia of CKD: Normocytic anemia, on B12 and ferrous sulfate. Aranesp 24mcg given 1/8. Hgb stable at 7.5 today.  4. Secondary hyperparathyroidism from CKD, chronic condition: - Ca corrects to 8.4 - Continue Calcitriol, Calcium - vitamin D 5. Nutrition - Renal diet  See attending attestation for final recommendations  Subjective:   [name] is a 55 y.o. with PMH of HFrEF, PAF, CKD4 s/p renal transplant, T2DM, HTN, DVT of RUE on Eliquis admitted for acute on chronic heart failure on hospital day  13.  Continues to improve. Remains weak. No diarrhea over the weekend. No new concerns on exam.   Objective:   BP (!) 162/78 (BP Location: Right Arm)   Pulse 69   Temp 98 F (36.7 C) (Oral)   Resp 16   Ht 5\' 11"  (1.803 m)   Wt 129.3 kg   LMP 11/05/2012   SpO2 99%   BMI 39.75 kg/m   Physical Exam: Gen: Chronically ill appearing, lying in bed in  NAD,  CVS: Regular rate and rhythm, no  Murmurs rubs or gallops. JVP 7-8 Resp: Coarse rales bilaterally, no wheezing or rhonchi Abd: NT , active bowel sounds  Ext: Dependent 2+ edema in thighs ( improving) , rue 2+ pitting edema ( wrapped)   Labs: BMET Recent Labs  Lab 07/11/20 0530 07/12/20 0302 07/12/20 1305 07/13/20 0401 07/14/20 0253 07/15/20 0815 07/16/20 0423 07/17/20 0500  NA 142 142 142 143 142 143 143 142  K 3.0* 3.2* 3.3* 3.9 3.9 3.2* 3.4* 3.4*  CL 109 106 108 108 106 104 107 105  CO2 22 24 23 24 23 24 25 24   GLUCOSE 89 99 141* 103* 124* 89 91 98  BUN 48* 52* 50* 50* 53* 57* 58* 61*  CREATININE 3.17* 3.18* 3.28* 3.37* 3.39* 3.47* 3.59* 3.58*  CALCIUM 6.7* 6.8* 6.9* 6.9* 7.2* 7.1* 7.3* 7.3*  PHOS 4.9* 4.9*  --  4.7* 4.9* 4.6 4.9* 4.9*   CBC Recent Labs  Lab 07/14/20 0253 07/15/20 0815 07/16/20 0423 07/16/20 2143 07/17/20 0500  WBC 6.3 5.9 5.7  --  6.1  HGB 7.6* 7.2* 7.0* 7.9* 7.5*  HCT 24.6* 23.9* 22.6* 25.9* 24.4*  MCV 95.0 94.8 95.0  --  94.6  PLT 178 169 151  --  171      Medications:    . sodium chloride   Intravenous Once  . amiodarone  200 mg Oral Daily  . apixaban  5 mg  Oral BID  . atorvastatin  40 mg Oral Daily  . calcitRIOL  0.5 mcg Oral Daily  . calcium-vitamin D  1 tablet Oral BID  . Chlorhexidine Gluconate Cloth  6 each Topical Daily  . darbepoetin (ARANESP) injection - NON-DIALYSIS  60 mcg Subcutaneous Q Sat-1800  . DULoxetine  30 mg Oral Daily  . ferrous sulfate  325 mg Oral BID WC  . guaiFENesin  600 mg Oral BID  . isosorbide-hydrALAZINE  1.5 tablet Oral TID  . metoprolol succinate  25 mg Oral Daily  . mycophenolate  540 mg Oral BID  . pantoprazole  40 mg Oral Daily  . potassium chloride  40 mEq Oral BID  . predniSONE  5 mg Oral Q breakfast  . sodium chloride flush  3 mL Intravenous Q12H  . tacrolimus  4 mg Oral BID  . torsemide  80 mg Oral Daily  . vitamin B-12  1,000 mcg Oral Daily  . Vitamin D (Ergocalciferol)  50,000 Units  Oral Q Sat     Tamsen Snider, MD 07/17/2020, 9:09 AM

## 2020-07-17 NOTE — Progress Notes (Signed)
Subjective: Patient reports doing well. Breathing well, though with some nocturnal dyspnea. Intermittent palpitations, none at present. Denies chest pain and abdominal pain. She has not had diarrhea. Significant improvement in pruritis following Benadryl cream administration.   Objective:  Vital signs in last 24 hours: Vitals:   07/17/20 0000 07/17/20 0459 07/17/20 0514 07/17/20 0800  BP: (!) 147/66 (!) 161/72  (!) 162/78  Pulse: 68 69  69  Resp: 18 16  16   Temp: 98.1 F (36.7 C) 98.1 F (36.7 C)  98 F (36.7 C)  TempSrc: Oral Oral  Oral  SpO2: 100% 100%  99%  Weight:   129.3 kg   Height:       Weight change: 0.454 kg  Intake/Output Summary (Last 24 hours) at 07/17/2020 0942 Last data filed at 07/17/2020 0900 Gross per 24 hour  Intake 1379.07 ml  Output 2125 ml  Net -745.93 ml   GENERAL- Alert, co-operative, No acute distress.  HEENT- Atraumatic, normocephalic  CARDIAC- RRR, no murmurs, rubs or gallops. RESP- LFNC, Normal effort, clear to auscultation bilaterally ABDOMEN- soft, nontender, nondistended NEURO- alert and oriented EXTREMITIES- symmetric, + 1 pitting edema. Resolving patch of erythema on LFA with hyperpigmentation  SKIN- Warm, dry. PSYCH- Normal mood and affect, appropriate thought content and speech.  Assessment/Plan:  Principal Problem:   Acute on chronic heart failure (HCC) Active Problems:   Acute kidney injury superimposed on chronic kidney disease ([place])   S/p cadaver renal transplant   PAF (paroxysmal atrial fibrillation) (HCC)   Chronic kidney disease (CKD), stage IV (severe) (HCC)   NSVT (nonsustained ventricular tachycardia) (HCC)  [name] is a 55 y.o. with PMH of HFrEF, PAF, CKD4 s/p renal transplant, T2DM, HTN, DVT on eliquis admit for acute on chronic systolic heart failure.  #Acute hypoxic respiratory failure #AcuteExacerbation of Chronic Biventricular Heart Failure  [name] continues to be hypervolemic on examination,  although improved compared to admission. Since starting Torsemide 80 mg daily, she has diuresed well overnight. Heart failure team had started Metoprolol. Catheter removal today, with transition to [place]  -monitor for urine production; bladder scan  -Cardiology following; appreciate their recommendations  - Milrinone discontinued by Cardiology - Torsemide 80 mg daily with diuresis goal of 1-2 L per day  - Metoprolol succinate 25 mg daily -Maintain K > 4.0, Mag > 2.0 - Strict I&Os, Daily Weights - Fluid restriction - Keep O2 sat >88%  # Acute Kidney Injury  # Chronic Kidney Disease Stage 4 # S/p Renal Transplant (2011) Tacrolimus dose increased to 4 mg BID. Creatinine stable 3.58 from 3.59 the day prior. AKI secondary to diuresis; with potential contribution of tacrolimus dose increase. On chronic immunosuppressionwith Tacrolimus, Mycophenolateand Prednisone.  - Nephrology following; appreciate their recommendations -Monitor creatinine - Avoid nephrotoxic meds when able -Continue home Prednisoneand Mycophenolate - Tacrolimus 4 mg BID - Repeat Tacrolimus level pending  # Chronic Normocytic Anemia  Baseline hemoglobin between 7-8 for the past year. On admission hemoglobin of 8 that downtrended to 6.8, requiring 1 unit of pRBCs with appropriate response. No bleeding source identified. No evidence of iron deficiency on iron panel. Most consistent with anemia of chronic kidney disease. Nephrology has started Aranesp.Transfused day prior, Hgb 7.5 today.   -Monitor hemoglobin -Transfuse PRN for hemoglobin <7.0.   - Aranesp per Nephrology  #Left forearm pruritus 2/2 IV Adhesive Patient with stable patch of resolving erythema; and associated pruritus to left upper extremity that is improving.  -Continue Topical benadryl to Left forearm PRN (  3days)  #Atrial Fibrillation with RVR CHADSVASC2 score of 4.Controlled at this time.  - Telemetry - Continue Amiodarone  200mg  - Continue Eliquis 5 mg BID  # Acute Gastroenteritis 2/2 Sapovirus Improved. Patient continues without diarrhea.   - Imodium PRN   # Right Upper ExtremityDVT First diagnosed in September 2021 with repeat venous duplex this admission showing persistent right axillary DVT with new SVT in the right cephalic vein. Patient is already anticoagulated with Eliquis; no medication changes indicated.   - Continue Eliquis 5 mg BID   # Hypertension  Blood pressure stable. Currently on Bidil for HFrEF and Metoprolol.   - Bidil 20-37.5 mg TID - Metoprolol succinate 25 mg daily  # Type 2 Diabetes Mellitus  A1c of 5.6% on admission. Patient has not required any insulin since admission.SSI and glucose checks were subsequently discontinued.   #Progressive hearing loss: Will need outpatient audiology testing and ENT follow up    LOS: 13 days   Azell Der, Medical Student 07/17/2020, 9:42 AM

## 2020-07-17 NOTE — Progress Notes (Signed)
Orthopedic Tech Progress Note Patient Details:  TAURA LAMARRE 03/18/1966 [id number]  Ortho Devices Type of Ortho Device: Louretta Parma boot Ortho Device/Splint Location: Bilateral Lower Extremity Ortho Device/Splint Interventions: Ordered,Application   Post Interventions Patient Tolerated: Well Instructions Provided: Care of device,Poper ambulation with device   Aariv Medlock P Lorel Monaco 07/17/2020, 11:45 AM

## 2020-07-17 NOTE — Progress Notes (Addendum)
Patient ID: [name], female   DOB: 20-Sep-1965, 55 y.o.   MRN: [id number]     Advanced Heart Failure Rounding Note  PCP-Cardiologist: No primary care provider on file.   Subjective:   Remains on milrinone 0.125 mcg. CO-OX 81%.   Feeling better. Still unable to stable. Denies shortness of breath.   Lab Results  Component Value Date   CREATININE 3.58 (H) 07/17/2020   CREATININE 3.59 (H) 07/16/2020   CREATININE 3.47 (H) 07/15/2020    Objective:   Weight Range: 129.3 kg Body mass index is 39.75 kg/m.   Vital Signs:   Temp:  [97.8 F (36.6 C)-98.9 F (37.2 C)] 98 F (36.7 C) (01/10 0800) Pulse Rate:  [67-84] 69 (01/10 0800) Resp:  [15-20] 16 (01/10 0800) BP: (127-162)/(56-78) 162/78 (01/10 0800) SpO2:  [96 %-100 %] 99 % (01/10 0800) Weight:  [129.3 kg] 129.3 kg (01/10 0514) Last BM Date: 07/17/19  Weight change: Filed Weights   07/14/20 0349 07/16/20 0126 07/17/20 0514  Weight: 129.7 kg 128.8 kg 129.3 kg    Intake/Output:   Intake/Output Summary (Last 24 hours) at 07/17/2020 0930 Last data filed at 07/17/2020 0900 Gross per 24 hour  Intake 1379.07 ml  Output 2125 ml  Net -745.93 ml      Physical Exam  CVP 6-7  General:   No resp difficulty HEENT: normal Neck: supple. JVP 6-7 . Carotids 2+ bilat; no bruits. No lymphadenopathy or thryomegaly appreciated. Cor: PMI nondisplaced. Regular rate & rhythm. No rubs, gallops or murmurs. Lungs: clear Abdomen: obese, soft, nontender, nondistended. No hepatosplenomegaly. No bruits or masses. Good bowel sounds. Extremities: no cyanosis, clubbing, rash, Upper thigh 1+ edema. RUE ace wrap.  Neuro: alert & orientedx3, cranial nerves grossly intact. moves all 4 extremities w/o difficulty. Affect pleasant  Telemetry: NSR 70-80s   EKG    N/a   Labs    CBC Recent Labs    07/16/20 0423 07/16/20 2143 07/17/20 0500  WBC 5.7  --  6.1  HGB 7.0* 7.9* 7.5*  HCT 22.6* 25.9* 24.4*  MCV 95.0  --  94.6  PLT 151  --   010   Basic Metabolic Panel Recent Labs    07/16/20 0423 07/17/20 0500  NA 143 142  K 3.4* 3.4*  CL 107 105  CO2 25 24  GLUCOSE 91 98  BUN 58* 61*  CREATININE 3.59* 3.58*  CALCIUM 7.3* 7.3*  MG 2.0 1.9  PHOS 4.9* 4.9*   Liver Function Tests Recent Labs    07/16/20 0423 07/17/20 0500  ALBUMIN 2.7* 2.6*   No results for input(s): LIPASE, AMYLASE in the last 72 hours. Cardiac Enzymes No results for input(s): CKTOTAL, CKMB, CKMBINDEX, TROPONINI in the last 72 hours.  BNP: BNP (last 3 results) Recent Labs    03/14/20 0920 07/04/20 1813  BNP >4,500.0* 9,323.5*    ProBNP (last 3 results) No results for input(s): PROBNP in the last 8760 hours.   D-Dimer No results for input(s): DDIMER in the last 72 hours. Hemoglobin A1C No results for input(s): HGBA1C in the last 72 hours. Fasting Lipid Panel No results for input(s): CHOL, HDL, LDLCALC, TRIG, CHOLHDL, LDLDIRECT in the last 72 hours. Thyroid Function Tests No results for input(s): TSH, T4TOTAL, T3FREE, THYROIDAB in the last 72 hours.  Invalid input(s): FREET3  Other results:   Imaging    No results found.   Medications:     Scheduled Medications: . sodium chloride   Intravenous Once  . amiodarone  200  mg Oral Daily  . apixaban  5 mg Oral BID  . atorvastatin  40 mg Oral Daily  . calcitRIOL  0.5 mcg Oral Daily  . calcium-vitamin D  1 tablet Oral BID  . Chlorhexidine Gluconate Cloth  6 each Topical Daily  . darbepoetin (ARANESP) injection - NON-DIALYSIS  60 mcg Subcutaneous Q Sat-1800  . DULoxetine  30 mg Oral Daily  . ferrous sulfate  325 mg Oral BID WC  . guaiFENesin  600 mg Oral BID  . isosorbide-hydrALAZINE  1.5 tablet Oral TID  . metoprolol succinate  25 mg Oral Daily  . mycophenolate  540 mg Oral BID  . pantoprazole  40 mg Oral Daily  . potassium chloride  40 mEq Oral BID  . predniSONE  5 mg Oral Q breakfast  . sodium chloride flush  3 mL Intravenous Q12H  . tacrolimus  4 mg Oral BID  .  torsemide  80 mg Oral Daily  . vitamin B-12  1,000 mcg Oral Daily  . Vitamin D (Ergocalciferol)  50,000 Units Oral Q Sat    Infusions: . sodium chloride 250 mL (07/16/20 1505)  . milrinone 0.125 mcg/kg/min (07/17/20 0525)    PRN Medications: sodium chloride, acetaminophen, diphenhydrAMINE-zinc acetate, loperamide, ondansetron (ZOFRAN) IV, sodium chloride flush    Assessment/Plan    1. Acute on chronic systolic CHF: Echo this admission with EF 25-30% with mild LV dilation, severe LVH, moderately decreased RV function/mild RV enlargement, moderate aortic insufficiency. Cause for cardiomyopathy is uncertain.  She had Cardiolite in 9/21 showing inferior and anterior fixed defects with minimal ischemia, possible ischemic cardiomyopathy.  Not cathed given no chest pain and CKD stage IV. Of note, sister had a recent heart transplant (? familial cardiomyopathy).    Creatinine up to 3.58 unchanged.   - Volume status much improved. CVP 6-7. Continue torsemide 80 mg daily without change today.  - Stop milrinone. Watch renal function.  - Continue Bidil 1.5 tabs tid.  - With CKD stage IV, no ARB/ARNI/spironolactone/digoxin.  - Continue Toprol XL 25 mg daily.  2. CKD stage IV: History of renal [facility] at [facility]. Baseline Cr around 2.5. Today's creatinine mildly higher at 3.58  - Continue prednisone and tacrolimus.  - Nephrology was consulted, recommendations appreciated.   3. CAD: Extensive CAD noted on prior chest CT.  She had Cardiolite in 9/21 showing inferior and anterior fixed defects with minimal ischemia, possible ischemic cardiomyopathy.  Not cathed given no chest pain and CKD stage IV. - Continue statin.  - No ASA given apixaban use.  4. Atrial fibrillation w RVR during admission started on amio drip. NSR today.  - Continue apixaban.  - Replete electrolytes PRN.  5. H/o DVT: On apixaban.  6. Type 2 DM.  -Stable on SSI.  7. Anemia: Hgb  Up to 7.5. FOBT was negative. S/p 1 PRBC  07/10/2019 - Transfuse hgb < 7, likely will need transfusion soon.  8. Upper extremity swelling: Remains on eliquis for right axillary DVT.  -Continue wraps.  9. Severe deconditioning PT following. Needs SNF.   Darrick Grinder, NP  9:30 AM  07/17/2020   Patient seen and examined with the above-signed Advanced Practice Provider and/or Housestaff. I personally reviewed laboratory data, imaging studies and relevant notes. I independently examined the patient and formulated the important aspects of the plan. I have edited the note to reflect any of my changes or salient points. I have personally discussed the plan with the patient and/or family.  She remains on milrinone.  Weight down 50 pounds. CVP 7. Denies SOB, orthopnea or PND. Co-ox 81%  Remains in NSR. Very weak. Unable to stand. Creatinine stable at 3.6  General:  Lying in bed  No resp difficulty HEENT: normal Neck: supple. CVP 7-8. Carotids 2+ bilat; no bruits. No lymphadenopathy or thryomegaly appreciated. Cor: PMI nondisplaced. Regular rate & rhythm. 2/6 TR Lungs: clear Abdomen: obese soft, nontender, nondistended. No hepatosplenomegaly. No bruits or masses. Good bowel sounds. Extremities: no cyanosis, clubbing, rash, 1-2+ edema Neuro: alert & orientedx3, cranial nerves grossly intact. moves all 4 extremities w/o difficulty. Affect pleasant  Weight down 50 pounds but still has some LE edema. Suspect this is as good as we can get her from a HF standpoint. Can stop milrinone. Will defer to Renal from this point forward regarding management of diuretics. Suspect she will need HD sood for long-term management of volume status. Will need SNF. Continue po amio and Eliquis for AF.   Glori Bickers, MD  11:21 AM

## 2020-07-17 NOTE — Progress Notes (Signed)
Physical Therapy Treatment Patient Details Name: [name] MRN: [id number] DOB: 1965/12/13 Today's Date: 07/17/2020    History of Present Illness Ms. Brigette Hopfer is 55yo female with HFrEF (Echo 03/2020: EF 25-30%), paroxysmal atrial fibrillation, CKD IV s/p renal transplant on chronic immunosuppression, hypertension, type II diabetes mellitus, hx DVT of R axillary vein on Eliquis, GERD admitted 12/28 for acute on chronic heart failure. Pt also with diarrhea due to + Sapovirus    PT Comments    Pt was seen for mobility on bed, due to having been sitting in bed needing to get cleaned up.  Rolling and scooting with one to two max assist  Esp to roll and get cleaned up.  Pt does not assist with her LE movement but can do some active moving.  Her fatigue after cleaning up and getting up on bed was significant.  Follow up with her for AAROM to move hips and knees.    Follow Up Recommendations  SNF     Equipment Recommendations  Wheelchair (measurements PT);Wheelchair cushion (measurements PT);Hospital bed;Other (comment);3in1 (PT)    Recommendations for Other Services       Precautions / Restrictions Precautions Precautions: Fall Precaution Comments: monitor vitals with all movement Restrictions Weight Bearing Restrictions: No    Mobility  Bed Mobility Overal bed mobility: Needs Assistance Bed Mobility: Supine to Sit;Sit to Supine Rolling: Max assist            Transfers Overall transfer level: Needs assistance                  Ambulation/Gait                 Stairs             Wheelchair Mobility    Modified Rankin (Stroke Patients Only)       Balance                                            Cognition Arousal/Alertness: Awake/alert Behavior During Therapy: WFL for tasks assessed/performed Overall Cognitive Status: Within Functional Limits for tasks assessed Area of Impairment: Following  commands;Safety/judgement                       Following Commands: Follows one step commands inconsistently;Follows one step commands with increased time Safety/Judgement: Decreased awareness of safety;Decreased awareness of deficits            Exercises General Exercises - Lower Extremity Heel Slides: Strengthening;10 reps Hip ABduction/ADduction: Strengthening;10 reps Hip Flexion/Marching: AROM;10 reps    General Comments        Pertinent Vitals/Pain Pain Assessment: Faces Faces Pain Scale: Hurts even more Pain Location: Bil legs/knees with movment Pain Descriptors / Indicators: Guarding;Grimacing Pain Intervention(s): Limited activity within patient's tolerance;Premedicated before session;Repositioned    Home Living                      Prior Function            PT Goals (current goals can now be found in the care plan section) Acute Rehab PT Goals Patient Stated Goal: to get stronger Progress towards PT goals: Progressing toward goals    Frequency    Min 2X/week      PT Plan Current plan remains appropriate    Co-evaluation  AM-PAC PT "6 Clicks" Mobility   Outcome Measure  Help needed turning from your back to your side while in a flat bed without using bedrails?: A Lot Help needed moving from lying on your back to sitting on the side of a flat bed without using bedrails?: A Lot Help needed moving to and from a bed to a chair (including a wheelchair)?: A Lot Help needed standing up from a chair using your arms (e.g., wheelchair or bedside chair)?: A Lot Help needed to walk in hospital room?: Total Help needed climbing 3-5 steps with a railing? : Total 6 Click Score: 10    End of Session Equipment Utilized During Treatment: Oxygen Activity Tolerance: Patient limited by pain Patient left: in bed;with call bell/phone within reach;with bed alarm set Nurse Communication: Mobility status;Need for lift equipment PT  Visit Diagnosis: Other abnormalities of gait and mobility (R26.89);Muscle weakness (generalized) (M62.81);Pain Pain - Right/Left:  (ba) Pain - part of body:  (back)     Time: 3291-9166 PT Time Calculation (min) (ACUTE ONLY): 29 min  Charges:  $Therapeutic Exercise: 8-22 mins $Therapeutic Activity: 8-22 mins           Ramond Dial 07/17/2020, 8:11 PM  Mee Hives, PT MS Acute Rehab Dept. Number: [place]

## 2020-07-18 DIAGNOSIS — N189 Chronic kidney disease, unspecified: Secondary | ICD-10-CM | POA: Diagnosis not present

## 2020-07-18 DIAGNOSIS — I82621 Acute embolism and thrombosis of deep veins of right upper extremity: Secondary | ICD-10-CM

## 2020-07-18 DIAGNOSIS — N179 Acute kidney failure, unspecified: Secondary | ICD-10-CM | POA: Diagnosis not present

## 2020-07-18 DIAGNOSIS — I5023 Acute on chronic systolic (congestive) heart failure: Secondary | ICD-10-CM | POA: Diagnosis not present

## 2020-07-18 DIAGNOSIS — K5289 Other specified noninfective gastroenteritis and colitis: Secondary | ICD-10-CM

## 2020-07-18 DIAGNOSIS — I480 Paroxysmal atrial fibrillation: Secondary | ICD-10-CM | POA: Diagnosis not present

## 2020-07-18 LAB — COOXEMETRY PANEL
Carboxyhemoglobin: 1.7 % — ABNORMAL HIGH (ref 0.5–1.5)
Methemoglobin: 0.9 % (ref 0.0–1.5)
O2 Saturation: 87.9 %
Total hemoglobin: 8.4 g/dL — ABNORMAL LOW (ref 12.0–16.0)

## 2020-07-18 LAB — GLUCOSE, CAPILLARY
Glucose-Capillary: 91 mg/dL (ref 70–99)
Glucose-Capillary: 131 mg/dL — ABNORMAL HIGH (ref 70–99)
Glucose-Capillary: 163 mg/dL — ABNORMAL HIGH (ref 70–99)

## 2020-07-18 LAB — RENAL FUNCTION PANEL
Albumin: 2.6 g/dL — ABNORMAL LOW (ref 3.5–5.0)
Anion gap: 13 (ref 5–15)
BUN: 63 mg/dL — ABNORMAL HIGH (ref 6–20)
CO2: 25 mmol/L (ref 22–32)
Calcium: 7.3 mg/dL — ABNORMAL LOW (ref 8.9–10.3)
Chloride: 105 mmol/L (ref 98–111)
Creatinine, Ser: 3.61 mg/dL — ABNORMAL HIGH (ref 0.44–1.00)
GFR, Estimated: 14 mL/min — ABNORMAL LOW (ref >60)
Glucose, Bld: 81 mg/dL (ref 70–99)
Phosphorus: 4.8 mg/dL — ABNORMAL HIGH (ref 2.5–4.6)
Potassium: 3.4 mmol/L — ABNORMAL LOW (ref 3.5–5.1)
Sodium: 143 mmol/L (ref 135–145)

## 2020-07-18 LAB — CBC
HCT: 25.1 % — ABNORMAL LOW (ref 36.0–46.0)
Hemoglobin: 7.7 g/dL — ABNORMAL LOW (ref 12.0–15.0)
MCH: 28.9 pg (ref 26.0–34.0)
MCHC: 30.7 g/dL (ref 30.0–36.0)
MCV: 94.4 fL (ref 80.0–100.0)
Platelets: 150 10*3/uL (ref 150–400)
RBC: 2.66 MIL/uL — ABNORMAL LOW (ref 3.87–5.11)
RDW: 16.3 % — ABNORMAL HIGH (ref 11.5–15.5)
WBC: 6.1 10*3/uL (ref 4.0–10.5)
nRBC: 0 % (ref 0.0–0.2)

## 2020-07-18 LAB — MAGNESIUM: Magnesium: 1.9 mg/dL (ref 1.7–2.4)

## 2020-07-18 LAB — TACROLIMUS LEVEL: Tacrolimus (FK506) - LabCorp: 2.1 ng/mL (ref 2.0–20.0)

## 2020-07-18 MED ORDER — ISOSORB DINITRATE-HYDRALAZINE 20-37.5 MG PO TABS
2.0000 | ORAL_TABLET | Freq: Three times a day (TID) | ORAL | Status: DC
  Administered 2020-07-18 – 2020-07-20 (×6): 2 via ORAL
  Filled 2020-07-18 (×6): qty 2

## 2020-07-18 NOTE — Progress Notes (Signed)
[facility] Progress Note   Assessment/ Plan:   1. AKI on CKD stage IV: Cardiorenal syndrome in setting of decompensated CHF. Baseline Cr estimated to be .7 at outpatient visit 04/2020. Recent review of Cr suggest baseline ~2.5.  - Cr last 3 days has been stable ( 3.59>3.58>3.61), BUN 58>61>63, continues to have adequate urine output - Recommend converting to PO Torsemide  and monitoring response. Appreciate patient is still above her baseline and this may take some time to improve. She will need follow up with her outpatient transplant center for evaluation.  - Continue mycophenolate, tacrolimus, and prednisone  2.Acute on chronic systolic heart failure with hypoxic respiratory failure - AHF following, milrinone stopped on 1/10. Came in at ~ 330 and diuresed to 285. CVP 6-7 per AHF check. Overall assessment is she is euvolemic. - Wean supplemental oxygen - On Bidil, Toprolo Xl. Avoiding ARB/ARNI/spirinolactone/digoxin in setting of CKD - Torsemide recommendation as above  3. Anemia of CKD: Normocytic anemia, on B12 and ferrous sulfate. Aranesp 19mcg given 1/8. Hgb stable today at 7.7 - Could consider transfusion goal of <8 given patient has preexisting CAD.  4. Secondary hyperparathyroidism from CKD, chronic condition: - Ca corrected for albumin stable at 8.4 - Continue Calcitriol, Calcium - vitamin D 5. Nutrition - Renal diet  See attending attestation for final recommendations  Subjective:   [name] is a 55 y.o. with PMH of HFrEF, PAF, CKD4 s/p renal transplant, T2DM, HTN, DVT of RUE on Eliquis admitted for acute on chronic heart failure on hospital day  14.  No acute concerns this morning. Patient reports she hasn't walked in a month. Continues to feel weak, but working with PT to regain strength.     Objective:   BP (!) 165/97 (BP Location: Right Arm)   Pulse 68   Temp 98.6 F (37 C) (Oral)   Resp 13   Ht 5\' 11"  (1.803 m)   Wt 129.3 kg   LMP 11/05/2012    SpO2 100%   BMI 39.75 kg/m   Physical Exam: Gen: Chronically ill appearing, lying in bed in NAD  CVS: Regular rate and rhythm, no murmurs rubs or gallops. JVP 7-8 Resp: CTAB, no wheezes, rhonchi, or rales Abd: obese, active bowel sounds, non tender, non distended  Ext: Dependent 2+ edema in thighs , RUE in ace wrap  Labs: BMET Recent Labs  Lab 07/12/20 0302 07/12/20 1305 07/13/20 0401 07/14/20 0253 07/15/20 0815 07/16/20 0423 07/17/20 0500  NA 142 142 143 142 143 143 142  K 3.2* 3.3* 3.9 3.9 3.2* 3.4* 3.4*  CL 106 108 108 106 104 107 105  CO2 24 23 24 23 24 25 24   GLUCOSE 99 141* 103* 124* 89 91 98  BUN 52* 50* 50* 53* 57* 58* 61*  CREATININE 3.18* 3.28* 3.37* 3.39* 3.47* 3.59* 3.58*  CALCIUM 6.8* 6.9* 6.9* 7.2* 7.1* 7.3* 7.3*  PHOS 4.9*  --  4.7* 4.9* 4.6 4.9* 4.9*   CBC Recent Labs  Lab 07/14/20 0253 07/15/20 0815 07/16/20 0423 07/16/20 2143 07/17/20 0500  WBC 6.3 5.9 5.7  --  6.1  HGB 7.6* 7.2* 7.0* 7.9* 7.5*  HCT 24.6* 23.9* 22.6* 25.9* 24.4*  MCV 95.0 94.8 95.0  --  94.6  PLT 178 169 151  --  171      Medications:    . sodium chloride   Intravenous Once  . amiodarone  200 mg Oral Daily  . apixaban  5 mg Oral BID  .  atorvastatin  40 mg Oral Daily  . calcitRIOL  0.5 mcg Oral Daily  . calcium-vitamin D  1 tablet Oral BID  . Chlorhexidine Gluconate Cloth  6 each Topical Daily  . darbepoetin (ARANESP) injection - NON-DIALYSIS  60 mcg Subcutaneous Q Sat-1800  . DULoxetine  30 mg Oral Daily  . ferrous sulfate  325 mg Oral BID WC  . guaiFENesin  600 mg Oral BID  . isosorbide-hydrALAZINE  1.5 tablet Oral TID  . metoprolol succinate  25 mg Oral Daily  . mycophenolate  540 mg Oral BID  . pantoprazole  40 mg Oral Daily  . potassium chloride  40 mEq Oral BID  . predniSONE  5 mg Oral Q breakfast  . sodium chloride flush  3 mL Intravenous Q12H  . tacrolimus  4 mg Oral BID  . torsemide  80 mg Oral Daily  . vitamin B-12  1,000 mcg Oral Daily  . Vitamin D  (Ergocalciferol)  50,000 Units Oral Q Sat     Tamsen Snider, MD 07/18/2020, 6:36 AM

## 2020-07-18 NOTE — Progress Notes (Addendum)
Patient ID: [name], female   DOB: 1966/03/26, 55 y.o.   MRN: [id number]     Advanced Heart Failure Rounding Note  PCP-Cardiologist: No primary care provider on file.   Subjective:   Yesterday milrinone stopped. CVP remains 6-7   Denies SOB. Continues to complain of leg fatigue.   Lab Results  Component Value Date   CREATININE 3.61 (H) 07/18/2020   CREATININE 3.58 (H) 07/17/2020   CREATININE 3.59 (H) 07/16/2020    Objective:   Weight Range: 129.3 kg Body mass index is 39.75 kg/m.   Vital Signs:   Temp:  [97.8 F (36.6 C)-98.6 F (37 C)] 98.6 F (37 C) (01/11 0300) Pulse Rate:  [61-69] 68 (01/11 0400) Resp:  [13-18] 13 (01/11 0400) BP: (155-165)/(67-97) 165/97 (01/11 0300) SpO2:  [99 %-100 %] 100 % (01/11 0400) FiO2 (%):  [97 %] 97 % (01/11 0400) Weight:  [129.3 kg] 129.3 kg (01/11 0500) Last BM Date: 07/17/20  Weight change: Filed Weights   07/16/20 0126 07/17/20 0514 07/18/20 0500  Weight: 128.8 kg 129.3 kg 129.3 kg    Intake/Output:   Intake/Output Summary (Last 24 hours) at 07/18/2020 0759 Last data filed at 07/18/2020 0400 Gross per 24 hour  Intake 443 ml  Output 1900 ml  Net -1457 ml      Physical Exam   CVP 6-7  General: In bed.  . No resp difficulty HEENT: normal Neck: supple.JVP 6-7 . Carotids 2+ bilat; no bruits. No lymphadenopathy or thryomegaly appreciated. Cor: PMI nondisplaced. Regular rate & rhythm. No rubs, gallops. 2/6 TR  Lungs: clear Abdomen: soft, nontender, nondistended. No hepatosplenomegaly. No bruits or masses. Good bowel sounds. Extremities: no cyanosis, clubbing, rash, R and LLE unna boots. Trace-1+ edema upper thighs. Neuro: alert & orientedx3, cranial nerves grossly intact. moves all 4 extremities w/o difficulty. Affect pleasant  Telemetry: NSR 60s   EKG    N/a   Labs    CBC Recent Labs    07/17/20 0500 07/18/20 0700  WBC 6.1 6.1  HGB 7.5* 7.7*  HCT 24.4* 25.1*  MCV 94.6 94.4  PLT 171 737   Basic  Metabolic Panel Recent Labs    07/17/20 0500 07/18/20 0700  NA 142 143  K 3.4* 3.4*  CL 105 105  CO2 24 25  GLUCOSE 98 81  BUN 61* 63*  CREATININE 3.58* 3.61*  CALCIUM 7.3* 7.3*  MG 1.9 1.9  PHOS 4.9* 4.8*   Liver Function Tests Recent Labs    07/17/20 0500 07/18/20 0700  ALBUMIN 2.6* 2.6*   No results for input(s): LIPASE, AMYLASE in the last 72 hours. Cardiac Enzymes No results for input(s): CKTOTAL, CKMB, CKMBINDEX, TROPONINI in the last 72 hours.  BNP: BNP (last 3 results) Recent Labs    03/14/20 0920 07/04/20 1813  BNP >4,500.0* 1,062.6*    ProBNP (last 3 results) No results for input(s): PROBNP in the last 8760 hours.   D-Dimer No results for input(s): DDIMER in the last 72 hours. Hemoglobin A1C No results for input(s): HGBA1C in the last 72 hours. Fasting Lipid Panel No results for input(s): CHOL, HDL, LDLCALC, TRIG, CHOLHDL, LDLDIRECT in the last 72 hours. Thyroid Function Tests No results for input(s): TSH, T4TOTAL, T3FREE, THYROIDAB in the last 72 hours.  Invalid input(s): FREET3  Other results:   Imaging    No results found.   Medications:     Scheduled Medications: . sodium chloride   Intravenous Once  . amiodarone  200 mg Oral Daily  .  apixaban  5 mg Oral BID  . atorvastatin  40 mg Oral Daily  . calcitRIOL  0.5 mcg Oral Daily  . calcium-vitamin D  1 tablet Oral BID  . Chlorhexidine Gluconate Cloth  6 each Topical Daily  . darbepoetin (ARANESP) injection - NON-DIALYSIS  60 mcg Subcutaneous Q Sat-1800  . DULoxetine  30 mg Oral Daily  . ferrous sulfate  325 mg Oral BID WC  . guaiFENesin  600 mg Oral BID  . isosorbide-hydrALAZINE  1.5 tablet Oral TID  . metoprolol succinate  25 mg Oral Daily  . mycophenolate  540 mg Oral BID  . pantoprazole  40 mg Oral Daily  . potassium chloride  40 mEq Oral BID  . predniSONE  5 mg Oral Q breakfast  . sodium chloride flush  3 mL Intravenous Q12H  . tacrolimus  4 mg Oral BID  . torsemide  80  mg Oral Daily  . vitamin B-12  1,000 mcg Oral Daily  . Vitamin D (Ergocalciferol)  50,000 Units Oral Q Sat    Infusions: . sodium chloride 250 mL (07/16/20 1505)    PRN Medications: sodium chloride, acetaminophen, diphenhydrAMINE-zinc acetate, loperamide, ondansetron (ZOFRAN) IV, sodium chloride flush    Assessment/Plan    1. Acute on chronic systolic CHF: Echo this admission with EF 25-30% with mild LV dilation, severe LVH, moderately decreased RV function/mild RV enlargement, moderate aortic insufficiency. Cause for cardiomyopathy is uncertain.  She had Cardiolite in 9/21 showing inferior and anterior fixed defects with minimal ischemia, possible ischemic cardiomyopathy.  Not cathed given no chest pain and CKD stage IV. Of note, sister had a recent heart transplant (? familial cardiomyopathy).   - Volume status stable. Continue current dose of torsemide. CVP 6-7. Creatinine 3.6 today which is unchanged from the last 3 days.  - Continue Bidil 1.5 tabs tid.  - With CKD stage IV, no ARB/ARNI/spironolactone/digoxin.  - Continue Toprol XL 25 mg daily.  2. CKD stage IV: History of renal transplant at [facility]. Baseline Cr around 2.5.  Creatinine 3.6 for the last 3 days.  - Continue prednisone and tacrolimus.  - Nephrology was consulted, recommendations appreciated.   3. CAD: Extensive CAD noted on prior chest CT.  She had Cardiolite in 9/21 showing inferior and anterior fixed defects with minimal ischemia, possible ischemic cardiomyopathy.  Not cathed given no chest pain and CKD stage IV. - No chest pain  - Continue statin.  - No ASA given apixaban use.  4. Atrial fibrillation w RVR during admission started on amio drip. NSR today.  - Continue apixaban.  - Replete electrolytes PRN.  5. H/o DVT: On apixaban.  6. Type 2 DM.  -Stable on SSI.  7. Anemia: Hgb  Up to 7.7. FOBT was negative. S/p 1 PRBC 07/10/2019 - Transfuse hgb < 7, likely will need transfusion soon.  8. Upper extremity  swelling: Remains on eliquis for right axillary DVT.  9. Severe deconditioning PT following. Needs SNF.   Check CO-OX. Now.   Darrick Grinder, NP  7:59 AM  07/18/2020  Patient seen and examined with the above-signed Advanced Practice Provider and/or Housestaff. I personally reviewed laboratory data, imaging studies and relevant notes. I independently examined the patient and formulated the important aspects of the plan. I have edited the note to reflect any of my changes or salient points. I have personally discussed the plan with the patient and/or family.  Off milrinone. Weight, co-ox and renal function stable on torsemide 80 daily. BP still high. Remains  in NSR  General: Lying in bed  No resp difficulty HEENT: normal Neck: supple. JVP to jaw. Carotids 2+ bilat; no bruits. No lymphadenopathy or thryomegaly appreciated. Cor: PMI nondisplaced. Regular rate & rhythm. 2/6 TR Lungs: clear Abdomen: obese soft, nontender, nondistended. No hepatosplenomegaly. No bruits or masses. Good bowel sounds. Extremities: no cyanosis, clubbing, rash, 1+ edema + UNNA Neuro: alert & orientedx3, cranial nerves grossly intact. moves all 4 extremities w/o difficulty. Affect pleasant  She is off inotropes.Weight and renal function stable.  I think this is a s good as we can get her. Will increase bidil to 2tabs tid.   HF team will s/o  Would continue  Eliquis 5 bid Bidil 2tabs bid Toprol XL 25 Amio 200 daily Atorva 40 Torsemide 80 daily   Glori Bickers, MD  12:03 PM

## 2020-07-18 NOTE — Progress Notes (Addendum)
Subjective: Patient reports doing well. Denies chest pain, sob, and abdominal pain. Some tenderness to RUQ, which is longstanding.   Objective:  Vital signs in last 24 hours: Vitals:   07/18/20 0400 07/18/20 0500 07/18/20 0810 07/18/20 1100  BP:      Pulse: 68  70 66  Resp: 13  20 19   Temp:   97.8 F (36.6 C) 97.7 F (36.5 C)  TempSrc:   Oral Oral  SpO2: 100%     Weight:  129.3 kg    Height:       Weight change: 0 kg  Intake/Output Summary (Last 24 hours) at 07/18/2020 1128 Last data filed at 07/18/2020 0804 Gross per 24 hour  Intake 563 ml  Output 1900 ml  Net -1337 ml   GENERAL- Alert, pleasant, No acute distress. Seen lying in bed.  HEENT- Atraumatic, normocephalic CARDIAC- RRR, no murmurs, rubs or gallops. RESP-Normal effort, course breath sounds bilaterally ABDOMEN- soft, nontender NEURO- alert and oriented, grossly nonfocal EXTREMITIES- symmetric, +1 pitting edema. SKIN- Warm, dry. PSYCH- Normal mood and affect, appropriate thought content and speech.  Assessment/Plan:  Principal Problem:   Acute on chronic heart failure (HCC) Active Problems:   Acute kidney injury superimposed on chronic kidney disease ([place])   S/p cadaver renal transplant   PAF (paroxysmal atrial fibrillation) (HCC)   Chronic kidney disease (CKD), stage IV (severe) (HCC)   NSVT (nonsustained ventricular tachycardia) (HCC)  [name] is a 55 y.o. with PMH of HFrEF, PAF, CKD4 s/p renal transplant, T2DM, HTN, DVT on eliquis admit for acute on chronic systolic heart failure.    # Acute hypoxic respiratory failure # Acute Exacerbation of Chronic Biventricular Heart Failure  [name] continues to be hypervolemic on examination, although much improved compared to admission. She is diuresing well on Torsemide 80 mg daily.      - Cardiology followed, signing off; appreciate their recommendations  - Milrinone discontinued by Cardiology on 1/10 - Torsemide 80 mg daily with diuresis goal  of 1-2 L per day  - Metoprolol succinate 25 mg daily  -Seen by PT 1/10, recommending SNF - Maintain K > 4.0, Mag > 2.0  - Strict I&Os, Daily Weights - Fluid restriction - Keep O2 sat >88%  # Acute Kidney Injury  # Chronic Kidney Disease Stage 4 # S/p Renal Transplant (2011) Tacrolimus dose previously increased to 4 mg BID. Creatinine stable 3.61 from 3.58 the day prior. AKI secondary to diuresis; with potential contribution of tacrolimus dose increase. On chronic immunosuppression with Tacrolimus, Mycophenolate and Prednisone.   - Nephrology following; appreciate their recommendations  - Monitor creatinine  - Avoid nephrotoxic meds when able - Continue home Prednisone and Mycophenolate  - Tacrolimus 4 mg BID - Repeat Tacrolimus level pending  # Chronic Normocytic Anemia  Baseline hemoglobin between 7-8 for the past year. On admission hemoglobin of 8 that downtrended to 6.8, requiring 1 unit of pRBCs with appropriate response. No bleeding source identified. No evidence of iron deficiency on iron panel. Most consistent with anemia of chronic kidney disease. Nephrology has started Aranesp. Transfused day prior, Hgb 7.7 today.    - Monitor hemoglobin  -Transfuse PRN for hemoglobin < 7.0.   - Aranesp per Nephrology     #Left forearm pruritus 2/2 IV Adhesive Patient with stable patch of resolving erythema; and associated pruritus to left upper extremity that is improving.  -Continue Topical benadryl to Left forearm PRN (3days)   # Atrial Fibrillation with RVR CHADSVASC2 score of  4. Controlled at this time.    - Telemetry - Continue Amiodarone 200mg  - Continue Eliquis 5 mg BID   # Acute Gastroenteritis 2/2 Sapovirus Improved. Patient continues without diarrhea.    - Imodium PRN  # Right Upper Extremity DVT First diagnosed in September 2021 with repeat venous duplex this admission showing persistent right axillary DVT with new SVT in the right cephalic vein. Patient is already  anticoagulated with Eliquis; no medication changes indicated.    - Continue Eliquis 5 mg BID    # Hypertension  Some elevation to bp. Currently on Bidil for HFrEF and Metoprolol.    - Bidil 40mg  TID - Metoprolol succinate 25 mg daily   # Type 2 Diabetes Mellitus  A1c of 5.6% on admission. Patient has not required any insulin since admission. SSI and glucose checks were subsequently discontinued.    # Progressive hearing loss: Will need outpatient audiology testing and ENT follow up      LOS: 14 days   Azell Der, Medical Student 07/18/2020, 11:28 AM

## 2020-07-19 LAB — CBC
HCT: 25.4 % — ABNORMAL LOW (ref 36.0–46.0)
Hemoglobin: 7.8 g/dL — ABNORMAL LOW (ref 12.0–15.0)
MCH: 29.1 pg (ref 26.0–34.0)
MCHC: 30.7 g/dL (ref 30.0–36.0)
MCV: 94.8 fL (ref 80.0–100.0)
Platelets: 177 10*3/uL (ref 150–400)
RBC: 2.68 MIL/uL — ABNORMAL LOW (ref 3.87–5.11)
RDW: 16.4 % — ABNORMAL HIGH (ref 11.5–15.5)
WBC: 5.8 10*3/uL (ref 4.0–10.5)
nRBC: 0 % (ref 0.0–0.2)

## 2020-07-19 LAB — RENAL FUNCTION PANEL
Albumin: 2.6 g/dL — ABNORMAL LOW (ref 3.5–5.0)
Anion gap: 14 (ref 5–15)
BUN: 66 mg/dL — ABNORMAL HIGH (ref 6–20)
CO2: 24 mmol/L (ref 22–32)
Calcium: 7.1 mg/dL — ABNORMAL LOW (ref 8.9–10.3)
Chloride: 105 mmol/L (ref 98–111)
Creatinine, Ser: 3.77 mg/dL — ABNORMAL HIGH (ref 0.44–1.00)
GFR, Estimated: 13 mL/min — ABNORMAL LOW (ref >60)
Glucose, Bld: 102 mg/dL — ABNORMAL HIGH (ref 70–99)
Phosphorus: 4.5 mg/dL (ref 2.5–4.6)
Potassium: 3.6 mmol/L (ref 3.5–5.1)
Sodium: 143 mmol/L (ref 135–145)

## 2020-07-19 LAB — COOXEMETRY PANEL
Carboxyhemoglobin: 1.6 % — ABNORMAL HIGH (ref 0.5–1.5)
Methemoglobin: 0.9 % (ref 0.0–1.5)
O2 Saturation: 77 %
Total hemoglobin: 7.6 g/dL — ABNORMAL LOW (ref 12.0–16.0)

## 2020-07-19 LAB — SARS CORONAVIRUS 2 (TAT 6-24 HRS): SARS Coronavirus 2: NEGATIVE

## 2020-07-19 LAB — GLUCOSE, CAPILLARY: Glucose-Capillary: 134 mg/dL — ABNORMAL HIGH (ref 70–99)

## 2020-07-19 LAB — MAGNESIUM: Magnesium: 1.9 mg/dL (ref 1.7–2.4)

## 2020-07-19 MED ORDER — TACROLIMUS 1 MG PO CAPS
5.0000 mg | ORAL_CAPSULE | Freq: Two times a day (BID) | ORAL | Status: DC
  Administered 2020-07-19 – 2020-07-20 (×2): 5 mg via ORAL
  Filled 2020-07-19 (×2): qty 5

## 2020-07-19 MED ORDER — SODIUM CHLORIDE 0.9% FLUSH
10.0000 mL | Freq: Two times a day (BID) | INTRAVENOUS | Status: DC
  Administered 2020-07-20: 10 mL

## 2020-07-19 MED ORDER — SODIUM CHLORIDE 0.9% FLUSH: 10.0000 mL | INTRAVENOUS | Status: DC | PRN

## 2020-07-19 NOTE — Progress Notes (Signed)
Subjective: Patient reports doing well. Seen 30 mins off of [place]; breathing well. She had very limited mobility for some time prior to admission due to fluid overload from HF, got assistance from family members. She reports bilateral knee pain with attempted motion at present; able to move arms with little concern (though some difficulty with right).   Objective:  Vital signs in last 24 hours: Vitals:   07/19/20 0229 07/19/20 0505 07/19/20 0530 07/19/20 0756  BP:  (!) 161/68 (!) 153/67 (!) 144/60  Pulse:  68  72  Resp:  15  17  Temp:  98.3 F (36.8 C)  98.4 F (36.9 C)  TempSrc:  Oral  Oral  SpO2:  100%  100%  Weight: 124.3 kg     Height:       Weight change: -4.99 kg  Intake/Output Summary (Last 24 hours) at 07/19/2020 1152 Last data filed at 07/19/2020 1047 Gross per 24 hour  Intake 1040 ml  Output 2200 ml  Net -1160 ml   General: Well appearing, no acute distress. Lying comfortably in bed.  CV: RRR, no murmurs Pulmonary: Breathing comfortably on room air. Mildly coarse bilaterally.  Abdominal: Soft, non-tender, obese.  Extremities: mild pitting edema.  MSK: bilateral knee pain with passive ROM. Knees symmetric.   Assessment/Plan:  Principal Problem:   Acute on chronic heart failure (HCC) Active Problems:   Acute kidney injury superimposed on chronic kidney disease ([place])   S/p cadaver renal transplant   PAF (paroxysmal atrial fibrillation) (HCC)   Chronic kidney disease (CKD), stage IV (severe) (HCC)   NSVT (nonsustained ventricular tachycardia) (HCC)   [name] is a 55 y.o. with PMH of HFrEF, PAF, CKD4 s/p renal transplant, T2DM, HTN, DVT on eliquis admit for acute on chronic systolic heart failure.  #Acute hypoxic respiratory failure #AcuteExacerbation of Chronic Biventricular Heart Failure  [name] is much improved compared to admission. She is stable on Torsemide 80 mg daily. PT/OT recommended SNF. Patient has had very limited mobility for  extended time; with notable deconditioning.   -Cardiology followed, signed off; appreciate their recommendations  -Likely discharge to SNF tomorrow -wean supplemental oxygen as tolerated - Milrinonediscontinued byCardiology on 1/10 - Torsemide 80 mg daily  - Metoprolol succinate 25 mg daily -Maintain K > 4.0, Mag > 2.0 - Strict I&Os, Daily Weights - Fluid restriction - Keep O2 sat >88%   # Acute Kidney Injury  # Chronic Kidney Disease Stage 4 # S/p Renal Transplant (2011) Tacrolimus dose increased to 5 mg BID by Nephrology.Bump in creatinine: 3.77 from 3.61 the day prior.Cardiorenal syndrome in the setting of decompensated CHF. On chronic immunosuppressionwith Tacrolimus, Mycophenolateand Prednisone.  -Appreciate Nephrology recs: Tacrolimus increased to 5mg  BID. Recheck level on 1/14. Schedule outpatient appointment with Dr.Singh practice if patient is discharged before then  -Monitor creatinine - Avoid nephrotoxic meds when able -Continue home Prednisoneand Mycophenolate - Tacrolimus 5 mg BID - Repeat Tacrolimus level    # Chronic Normocytic Anemia  Baseline hemoglobin between 7-8 for the past year. On admission hemoglobin of 8 that downtrended to 6.8, requiring 1 unit of pRBCs with appropriate response. No bleeding source identified. No evidence of iron deficiency on iron panel. Most consistent with anemia of chronickidneydisease. On Aranesp, started by Nephrology.Hgb stable.   -Monitor hemoglobin -Transfuse PRN for hemoglobin <7.0. - Aranesp per Nephrology   #Atrial Fibrillation  CHADSVASC2 score of 4.Controlled at this time.  - Telemetry - Continue Amiodarone 200mg  - Continue Eliquis 5 mg BID  #  Acute Gastroenteritis 2/2 Sapovirus Resolved.   # Right Upper ExtremityDVT First diagnosed in September 2021 with repeat venous duplex this admission showing persistent right axillary DVT with new SVT in the right cephalic vein. Patient  is already anticoagulated with Eliquis; no medication changes indicated.   - Continue Eliquis 5 mg BID    # Hypertension  Currently on Bidil for HFrEF, and Metoprolol.   - Bidil 2 tablets TID - Metoprolol succinate 25 mg daily  # Type 2 Diabetes Mellitus  A1c of 5.6% on admission. Patient has not required any insulin since admission.SSI and glucose checks were subsequently discontinued.  #Progressive hearing loss: Will need outpatient audiology testing and ENT follow up    LOS: 15 days   Azell Der, Medical Student 07/19/2020, 11:52 AM

## 2020-07-19 NOTE — Progress Notes (Signed)
Order for CVP and d/c Central line placed. Will d/c central line and CVP. Primary team okay with patient not having an PIV since all meds are PO and plan is to d/c the patient hopefully tomorrow.

## 2020-07-19 NOTE — Progress Notes (Signed)
[facility] Progress Note   Assessment/ Plan:   1. AKI on CKD stage IV: Cardiorenal syndrome in setting of decompensated CHF. Baseline Cr estimated to be .7 at outpatient visit 04/2020. Recent review of Cr suggest baseline ~2.5.  - Continue mycophenolate, and prednisone  - Continues to have adequate urine output. Cr/BUN continues to trend up slowly - Will need outpatient follow up with her transplant center.  - Tacrolimus level 2.1 on 1/8 , increase dose of Tacrolimus to 5mg  BID, recheck level on 1/14. If patient is discharged before , Dr.Singh will have patient scheduled for outpatient appointment with his practice.  2.Acute on chronic systolic heart failure with hypoxic respiratory failure - AHF has signed off. Final recommendations to continue Torsemide 80 mg daily, Bidil 2 tabs BID, Toprolol XL 25, Amio 200 daily , Eliquis 5 BID, and Atorva 40. - Wean supplemental oxygen as tolerated 3. Anemia of CKD: Normocytic anemia, on B12 and ferrous sulfate. Aranesp 46mcg given 1/8. Hgb stable today at 7.8  4. Secondary hyperparathyroidism from CKD, chronic condition: - Ca corrected for albumin  - Continue Calcitriol, Calcium - vitamin D 5. Nutrition - Renal diet  See attending attestation for final recommendations  Subjective:   [name] is a 55 y.o. with PMH of HFrEF, PAF, CKD4 s/p renal transplant, T2DM, HTN, DVT of RUE on Eliquis admitted for acute on chronic heart failure on hospital day  15.  No acute changes overnight. Patient continues to not have enough strength to walk. We discussed and I encouraged focusing her attention on regaining her strength. All questions and concerns addressed.    Objective:   BP (!) 153/67   Pulse 68   Temp 98.3 F (36.8 C) (Oral)   Resp 15   Ht 5\' 11"  (1.803 m)   Wt 124.3 kg   LMP 11/05/2012   SpO2 100%   BMI 38.22 kg/m   Physical Exam: Gen: Chronically ill appearing, lying in bed in NAD  CVS: Regular rate and rhythm, no  murmurs rubs or gallops. JVP 7-8 Resp: CTAB, no wheezes, rhonchi, or rales Abd: obese, active bowel sounds, non tender, non distended  Ext: Dependent 2+ edema in thighs , RUE in ace wrap  Labs: BMET Recent Labs  Lab 07/13/20 0401 07/14/20 0253 07/15/20 0815 07/16/20 0423 07/17/20 0500 07/18/20 0700 07/19/20 0327  NA 143 142 143 143 142 143 143  K 3.9 3.9 3.2* 3.4* 3.4* 3.4* 3.6  CL 108 106 104 107 105 105 105  CO2 24 23 24 25 24 25 24   GLUCOSE 103* 124* 89 91 98 81 102*  BUN 50* 53* 57* 58* 61* 63* 66*  CREATININE 3.37* 3.39* 3.47* 3.59* 3.58* 3.61* 3.77*  CALCIUM 6.9* 7.2* 7.1* 7.3* 7.3* 7.3* 7.1*  PHOS 4.7* 4.9* 4.6 4.9* 4.9* 4.8* 4.5   CBC Recent Labs  Lab 07/16/20 0423 07/16/20 2143 07/17/20 0500 07/18/20 0700 07/19/20 0327  WBC 5.7  --  6.1 6.1 5.8  HGB 7.0* 7.9* 7.5* 7.7* 7.8*  HCT 22.6* 25.9* 24.4* 25.1* 25.4*  MCV 95.0  --  94.6 94.4 94.8  PLT 151  --  171 150 177      Medications:    . sodium chloride   Intravenous Once  . amiodarone  200 mg Oral Daily  . apixaban  5 mg Oral BID  . atorvastatin  40 mg Oral Daily  . calcitRIOL  0.5 mcg Oral Daily  . calcium-vitamin D  1 tablet Oral BID  .  Chlorhexidine Gluconate Cloth  6 each Topical Daily  . darbepoetin (ARANESP) injection - NON-DIALYSIS  60 mcg Subcutaneous Q Sat-1800  . DULoxetine  30 mg Oral Daily  . ferrous sulfate  325 mg Oral BID WC  . guaiFENesin  600 mg Oral BID  . isosorbide-hydrALAZINE  2 tablet Oral TID  . metoprolol succinate  25 mg Oral Daily  . mycophenolate  540 mg Oral BID  . pantoprazole  40 mg Oral Daily  . potassium chloride  40 mEq Oral BID  . predniSONE  5 mg Oral Q breakfast  . sodium chloride flush  3 mL Intravenous Q12H  . tacrolimus  4 mg Oral BID  . torsemide  80 mg Oral Daily  . vitamin B-12  1,000 mcg Oral Daily  . Vitamin D (Ergocalciferol)  50,000 Units Oral Q Sat     Tamsen Snider, MD 07/19/2020, 7:05 AM

## 2020-07-19 NOTE — TOC Progression Note (Signed)
Transition of Care [facility]) - Progression Note    Patient Details  Name: [name] MRN: [id number] Date of Birth: 03-08-1966  Transition of Care [facility]) CM/SW [place], [place] Phone Number: 07/19/2020, 2:39 PM  Clinical Narrative:     [facility] cannot take pt as pt is not fully vaccinated and they don't have any unvacc beds available.   CSW spoke with Juliann Pulse at [facility]. They may be able to accept pt tomorrow.   CSW notified pt daughter. She is okay with [facility].   Expected Discharge Plan: [place] Barriers to Discharge: Continued Medical Work up  Expected Discharge Plan and Services Expected Discharge Plan: [place] arrangements for the past 2 months: Apartment                                       Social Determinants of Health (SDOH) Interventions    Readmission Risk Interventions No flowsheet data found.

## 2020-07-19 NOTE — Progress Notes (Signed)
Occupational Therapy Treatment Patient Details Name: [name] MRN: [id number] DOB: 1965-12-12 Today's Date: 07/19/2020    History of present illness [name] is 55yo female with HFrEF (Echo 03/2020: EF 25-30%), paroxysmal atrial fibrillation, CKD IV s/p renal transplant on chronic immunosuppression, hypertension, type II diabetes mellitus, hx DVT of R axillary vein on Eliquis, GERD admitted 12/28 for acute on chronic heart failure. Pt also with diarrhea due to + Sapovirus   OT comments  Patient making small gains to stated OT goals. Attempted AA and theraband HEP to upper extremities.  Yellow theraband used.  Patient with complaint of R shoulder and L elbow pain with higher resistance.   Will have to be gentle with theraband use.  Having patient hold her hands together for AA to start out may be a good warm up for theraband use.  Patient noted with dyspnea with exertion.  Rest breaks given and education on breathing during exertion phase of exercise.  OT to continue efforts in the acute setting.  SNF is recommended post acute.    Follow Up Recommendations  SNF;Supervision/Assistance - 24 hour    Equipment Recommendations  Wheelchair (measurements OT);Wheelchair cushion (measurements OT);Hospital bed;Other (comment)    Recommendations for Other Services      Precautions / Restrictions Precautions Precautions: Fall                                                                                   Exercises General Exercises - Upper Extremity Shoulder Flexion: AAROM;Both;5 reps Shoulder Extension: AAROM;Both;5 reps Elbow Flexion: AAROM;Both;10 reps Elbow Extension: AAROM;Both;10 reps Other Exercises Other Exercises: thera band: attempted tricep push, bicep curl, chest press and lat pull.  one set and up to 5 reps.   Shoulder Instructions       General Comments      Pertinent Vitals/ Pain       Faces Pain Scale: Hurts little  more Pain Location: R shoulder and L elbow Pain Descriptors / Indicators: Tender Pain Intervention(s): Monitored during session                                                          Frequency  Min 2X/week        Progress Toward Goals  OT Goals(current goals can now be found in the care plan section)  Progress towards OT goals: Progressing toward goals  Acute Rehab OT Goals Patient Stated Goal: to get stronger OT Goal Formulation: With patient Time For Goal Achievement: 08/02/20 Potential to Achieve Goals: Fair ADL Goals Pt/caregiver will Perform Home Exercise Program: Increased ROM;Increased strength;Both right and left upper extremity;With theraband;With minimal assist;With written HEP provided  Plan Discharge plan remains appropriate    Co-evaluation                 AM-PAC OT "6 Clicks" Daily Activity     Outcome Measure   Help from another person eating meals?: None Help from another person taking care of personal grooming?: A Little  Help from another person toileting, which includes using toliet, bedpan, or urinal?: Total Help from another person bathing (including washing, rinsing, drying)?: A Lot Help from another person to put on and taking off regular upper body clothing?: A Lot Help from another person to put on and taking off regular lower body clothing?: Total 6 Click Score: 13    End of Session    OT Visit Diagnosis: Pain;Muscle weakness (generalized) (M62.81) Pain - Right/Left: Right Pain - part of body: Shoulder   Activity Tolerance Patient limited by fatigue;Patient limited by pain   Patient Left in bed;with call bell/phone within reach   Nurse Communication          Time: 7356-7014 OT Time Calculation (min): 14 min  Charges: OT General Charges $OT Visit: 1 Visit OT Treatments $Therapeutic Exercise: 8-22 mins  07/19/2020  Rich, OTR/L  Acute Rehabilitation Services  Office:   [phone]    Metta Clines 07/19/2020, 1:31 PM

## 2020-07-19 NOTE — Progress Notes (Signed)
Per MD keep the central line for now due to patient being hard stick. No CVP needed. Dressing changed by two RN's.

## 2020-07-20 DIAGNOSIS — R279 Unspecified lack of coordination: Secondary | ICD-10-CM | POA: Diagnosis not present

## 2020-07-20 DIAGNOSIS — E785 Hyperlipidemia, unspecified: Secondary | ICD-10-CM | POA: Diagnosis present

## 2020-07-20 DIAGNOSIS — D6859 Other primary thrombophilia: Secondary | ICD-10-CM | POA: Diagnosis present

## 2020-07-20 DIAGNOSIS — I130 Hypertensive heart and chronic kidney disease with heart failure and stage 1 through stage 4 chronic kidney disease, or unspecified chronic kidney disease: Secondary | ICD-10-CM | POA: Diagnosis not present

## 2020-07-20 DIAGNOSIS — E876 Hypokalemia: Secondary | ICD-10-CM | POA: Diagnosis present

## 2020-07-20 DIAGNOSIS — Z7901 Long term (current) use of anticoagulants: Secondary | ICD-10-CM | POA: Diagnosis not present

## 2020-07-20 DIAGNOSIS — D84821 Immunodeficiency due to drugs: Secondary | ICD-10-CM | POA: Diagnosis present

## 2020-07-20 DIAGNOSIS — I10 Essential (primary) hypertension: Secondary | ICD-10-CM

## 2020-07-20 DIAGNOSIS — G894 Chronic pain syndrome: Secondary | ICD-10-CM | POA: Diagnosis not present

## 2020-07-20 DIAGNOSIS — E669 Obesity, unspecified: Secondary | ICD-10-CM | POA: Diagnosis present

## 2020-07-20 DIAGNOSIS — D696 Thrombocytopenia, unspecified: Secondary | ICD-10-CM | POA: Diagnosis present

## 2020-07-20 DIAGNOSIS — D638 Anemia in other chronic diseases classified elsewhere: Secondary | ICD-10-CM | POA: Diagnosis not present

## 2020-07-20 DIAGNOSIS — I5043 Acute on chronic combined systolic (congestive) and diastolic (congestive) heart failure: Secondary | ICD-10-CM | POA: Diagnosis not present

## 2020-07-20 DIAGNOSIS — Z23 Encounter for immunization: Secondary | ICD-10-CM | POA: Diagnosis not present

## 2020-07-20 DIAGNOSIS — E119 Type 2 diabetes mellitus without complications: Secondary | ICD-10-CM

## 2020-07-20 DIAGNOSIS — D631 Anemia in chronic kidney disease: Secondary | ICD-10-CM | POA: Diagnosis present

## 2020-07-20 DIAGNOSIS — N179 Acute kidney failure, unspecified: Secondary | ICD-10-CM | POA: Diagnosis present

## 2020-07-20 DIAGNOSIS — I5023 Acute on chronic systolic (congestive) heart failure: Secondary | ICD-10-CM | POA: Diagnosis not present

## 2020-07-20 DIAGNOSIS — M6281 Muscle weakness (generalized): Secondary | ICD-10-CM | POA: Diagnosis not present

## 2020-07-20 DIAGNOSIS — N185 Chronic kidney disease, stage 5: Secondary | ICD-10-CM | POA: Diagnosis present

## 2020-07-20 DIAGNOSIS — I5042 Chronic combined systolic (congestive) and diastolic (congestive) heart failure: Secondary | ICD-10-CM | POA: Diagnosis present

## 2020-07-20 DIAGNOSIS — I82621 Acute embolism and thrombosis of deep veins of right upper extremity: Secondary | ICD-10-CM | POA: Diagnosis not present

## 2020-07-20 DIAGNOSIS — M25561 Pain in right knee: Secondary | ICD-10-CM | POA: Diagnosis not present

## 2020-07-20 DIAGNOSIS — N2581 Secondary hyperparathyroidism of renal origin: Secondary | ICD-10-CM | POA: Diagnosis present

## 2020-07-20 DIAGNOSIS — R0602 Shortness of breath: Secondary | ICD-10-CM | POA: Diagnosis not present

## 2020-07-20 DIAGNOSIS — I472 Ventricular tachycardia: Secondary | ICD-10-CM | POA: Diagnosis not present

## 2020-07-20 DIAGNOSIS — K625 Hemorrhage of anus and rectum: Secondary | ICD-10-CM | POA: Diagnosis not present

## 2020-07-20 DIAGNOSIS — R195 Other fecal abnormalities: Secondary | ICD-10-CM | POA: Diagnosis not present

## 2020-07-20 DIAGNOSIS — R5381 Other malaise: Secondary | ICD-10-CM | POA: Diagnosis not present

## 2020-07-20 DIAGNOSIS — M170 Bilateral primary osteoarthritis of knee: Secondary | ICD-10-CM | POA: Diagnosis not present

## 2020-07-20 DIAGNOSIS — J9601 Acute respiratory failure with hypoxia: Secondary | ICD-10-CM | POA: Diagnosis not present

## 2020-07-20 DIAGNOSIS — R197 Diarrhea, unspecified: Secondary | ICD-10-CM | POA: Diagnosis not present

## 2020-07-20 DIAGNOSIS — Y830 Surgical operation with transplant of whole organ as the cause of abnormal reaction of the patient, or of later complication, without mention of misadventure at the time of the procedure: Secondary | ICD-10-CM | POA: Diagnosis present

## 2020-07-20 DIAGNOSIS — D509 Iron deficiency anemia, unspecified: Secondary | ICD-10-CM

## 2020-07-20 DIAGNOSIS — I132 Hypertensive heart and chronic kidney disease with heart failure and with stage 5 chronic kidney disease, or end stage renal disease: Secondary | ICD-10-CM | POA: Diagnosis present

## 2020-07-20 DIAGNOSIS — Z20822 Contact with and (suspected) exposure to covid-19: Secondary | ICD-10-CM | POA: Diagnosis present

## 2020-07-20 DIAGNOSIS — T8619 Other complication of kidney transplant: Secondary | ICD-10-CM | POA: Diagnosis present

## 2020-07-20 DIAGNOSIS — R5383 Other fatigue: Secondary | ICD-10-CM | POA: Diagnosis not present

## 2020-07-20 DIAGNOSIS — I482 Chronic atrial fibrillation, unspecified: Secondary | ICD-10-CM | POA: Diagnosis not present

## 2020-07-20 DIAGNOSIS — D62 Acute posthemorrhagic anemia: Secondary | ICD-10-CM | POA: Diagnosis present

## 2020-07-20 DIAGNOSIS — K219 Gastro-esophageal reflux disease without esophagitis: Secondary | ICD-10-CM | POA: Diagnosis not present

## 2020-07-20 DIAGNOSIS — I480 Paroxysmal atrial fibrillation: Secondary | ICD-10-CM | POA: Diagnosis present

## 2020-07-20 DIAGNOSIS — Z86010 Personal history of colonic polyps: Secondary | ICD-10-CM | POA: Diagnosis not present

## 2020-07-20 DIAGNOSIS — F32A Depression, unspecified: Secondary | ICD-10-CM | POA: Diagnosis present

## 2020-07-20 DIAGNOSIS — M25562 Pain in left knee: Secondary | ICD-10-CM | POA: Diagnosis not present

## 2020-07-20 DIAGNOSIS — Z6836 Body mass index (BMI) 36.0-36.9, adult: Secondary | ICD-10-CM | POA: Diagnosis not present

## 2020-07-20 DIAGNOSIS — D649 Anemia, unspecified: Secondary | ICD-10-CM | POA: Diagnosis not present

## 2020-07-20 DIAGNOSIS — I5082 Biventricular heart failure: Secondary | ICD-10-CM | POA: Diagnosis present

## 2020-07-20 DIAGNOSIS — N184 Chronic kidney disease, stage 4 (severe): Secondary | ICD-10-CM | POA: Diagnosis not present

## 2020-07-20 DIAGNOSIS — Z86718 Personal history of other venous thrombosis and embolism: Secondary | ICD-10-CM | POA: Diagnosis not present

## 2020-07-20 DIAGNOSIS — E1122 Type 2 diabetes mellitus with diabetic chronic kidney disease: Secondary | ICD-10-CM | POA: Diagnosis present

## 2020-07-20 DIAGNOSIS — Z940 Kidney transplant status: Secondary | ICD-10-CM | POA: Diagnosis not present

## 2020-07-20 DIAGNOSIS — Y929 Unspecified place or not applicable: Secondary | ICD-10-CM | POA: Diagnosis not present

## 2020-07-20 DIAGNOSIS — E8809 Other disorders of plasma-protein metabolism, not elsewhere classified: Secondary | ICD-10-CM | POA: Diagnosis present

## 2020-07-20 LAB — CBC
HCT: 25.6 % — ABNORMAL LOW (ref 36.0–46.0)
Hemoglobin: 7.7 g/dL — ABNORMAL LOW (ref 12.0–15.0)
MCH: 28.6 pg (ref 26.0–34.0)
MCHC: 30.1 g/dL (ref 30.0–36.0)
MCV: 95.2 fL (ref 80.0–100.0)
Platelets: 163 10*3/uL (ref 150–400)
RBC: 2.69 MIL/uL — ABNORMAL LOW (ref 3.87–5.11)
RDW: 16.3 % — ABNORMAL HIGH (ref 11.5–15.5)
WBC: 6.4 10*3/uL (ref 4.0–10.5)
nRBC: 0 % (ref 0.0–0.2)

## 2020-07-20 LAB — RENAL FUNCTION PANEL
Albumin: 2.4 g/dL — ABNORMAL LOW (ref 3.5–5.0)
Anion gap: 13 (ref 5–15)
BUN: 66 mg/dL — ABNORMAL HIGH (ref 6–20)
CO2: 23 mmol/L (ref 22–32)
Calcium: 7.2 mg/dL — ABNORMAL LOW (ref 8.9–10.3)
Chloride: 106 mmol/L (ref 98–111)
Creatinine, Ser: 3.64 mg/dL — ABNORMAL HIGH (ref 0.44–1.00)
GFR, Estimated: 14 mL/min — ABNORMAL LOW (ref >60)
Glucose, Bld: 107 mg/dL — ABNORMAL HIGH (ref 70–99)
Phosphorus: 4.4 mg/dL (ref 2.5–4.6)
Potassium: 3.4 mmol/L — ABNORMAL LOW (ref 3.5–5.1)
Sodium: 142 mmol/L (ref 135–145)

## 2020-07-20 LAB — MAGNESIUM: Magnesium: 1.8 mg/dL (ref 1.7–2.4)

## 2020-07-20 MED ORDER — ISOSORB DINITRATE-HYDRALAZINE 20-37.5 MG PO TABS: 1.5000 | ORAL_TABLET | Freq: Three times a day (TID) | ORAL | Status: DC

## 2020-07-20 MED ORDER — DARBEPOETIN ALFA 60 MCG/0.3ML IJ SOSY: 60.0000 ug | PREFILLED_SYRINGE | INTRAMUSCULAR | Status: AC

## 2020-07-20 MED ORDER — ISOSORB DINITRATE-HYDRALAZINE 20-37.5 MG PO TABS: 2.0000 | ORAL_TABLET | Freq: Three times a day (TID) | ORAL | Status: DC

## 2020-07-20 MED ORDER — TACROLIMUS 5 MG PO CAPS
5.0000 mg | ORAL_CAPSULE | Freq: Two times a day (BID) | ORAL | Status: AC
Start: 2020-07-20 — End: ?

## 2020-07-20 MED ORDER — PREDNISONE 5 MG PO TABS: 5.0000 mg | ORAL_TABLET | Freq: Every day | ORAL | Status: AC

## 2020-07-20 MED ORDER — TORSEMIDE 20 MG PO TABS: 80.0000 mg | ORAL_TABLET | Freq: Every day | ORAL | Status: AC

## 2020-07-20 MED ORDER — COVID-19 MRNA VACCINE (PFIZER) 30 MCG/0.3ML IM SUSP
0.3000 mL | Freq: Once | INTRAMUSCULAR | Status: AC
  Administered 2020-07-20: 0.3 mL via INTRAMUSCULAR
  Filled 2020-07-20: qty 0.3

## 2020-07-20 MED ORDER — POTASSIUM CHLORIDE 20 MEQ PO PACK
40.0000 meq | PACK | Freq: Once | ORAL | Status: AC
  Administered 2020-07-20: 40 meq via ORAL
  Filled 2020-07-20: qty 2

## 2020-07-20 MED ORDER — LOPERAMIDE HCL 2 MG PO CAPS: 4.0000 mg | ORAL_CAPSULE | Freq: Every day | ORAL | 0 refills | Status: AC | PRN

## 2020-07-20 MED ORDER — AMIODARONE HCL 200 MG PO TABS: 200.0000 mg | ORAL_TABLET | Freq: Every day | ORAL | 0 refills | Status: AC

## 2020-07-20 NOTE — Progress Notes (Signed)
Subjective:  Patient reports doing well. Breathing comfortably without supplemental oxygen for several hours. Denies pain. She desires her second [place] vaccine (first was on 9/17); patient reports had planned to receive second vaccine at home previously, but ultimately did not get.   Objective:  Vital signs in last 24 hours: Vitals:   07/19/20 1627 07/19/20 2052 07/20/20 0409 07/20/20 0818  BP: (!) 175/78 (!) 159/72 (!) 151/69 (!) 146/83  Pulse: 77 66  71  Resp: 18 (!) 9 18 12   Temp: (!) 97.5 F (36.4 C) 98.2 F (36.8 C) 97.8 F (36.6 C) 98.4 F (36.9 C)  TempSrc: Oral Oral Oral Oral  SpO2: 100% 100% 98% 99%  Weight:   123.8 kg   Height:       Weight change: -0.454 kg  Intake/Output Summary (Last 24 hours) at 07/20/2020 0854 Last data filed at 07/20/2020 0824 Gross per 24 hour  Intake 600 ml  Output 2650 ml  Net -2050 ml   GENERAL- Alert, co-operative, No acute distress.  HEENT- Atraumatic, normocephalic CARDIAC- Regular rate and rythmn, no murmurs RESP-Breathing comfortably on RA, clear to auscultation bilaterally, no wheezes or crackles.  ABDOMEN- soft, nontender, nondistended NEURO- alert and oriented, grossly nonfocal EXTREMITIES- symmetric, markedly diminished pedal edema. SKIN- Warm, dry. PSYCH- Normal mood and affect, appropriate thought content and speech.   Assessment/Plan:  Principal Problem:   Acute on chronic heart failure (HCC) Active Problems:   Acute kidney injury superimposed on chronic kidney disease ([name])   S/p cadaver renal transplant   PAF (paroxysmal atrial fibrillation) (HCC)   Chronic kidney disease (CKD), stage IV (severe) (HCC)   NSVT (nonsustained ventricular tachycardia) (HCC)  [name] is a 55 y.o. with PMH of HFrEF, PAF, CKD4 s/p renal transplant, T2DM, HTN, DVT on eliquis admit for acute on chronic systolic heart failure.   #Acute hypoxic respiratory failure #AcuteExacerbation of Chronic Biventricular Heart  Failure  #Vaccination [name] near euvolemic on examination. She is compensated on Torsemide 80 mg daily, and Metoprolol 25mg  daily. Coronavirus negative. Patient partially vaccinated against COVID, desires second vaccination prior to discharge. Appreciate PT, Social Work, Cardiology, and Nephrology recommendations.   -Cardiology followed, signed off -Discharge to SNF today -To receive Second Pfizer vaccine today - Milrinonediscontinued byCardiology on 1/10 - Torsemide 80 mg daily  - Metoprolol succinate 25 mg daily -Wean oxygen as tolerated -Maintain K > 4.0, Mag > 2.0 - Strict I&Os, Daily Weights - Fluid restriction - Keep O2 sat >88%   # Acute Kidney Injury  # Chronic Kidney Disease Stage 4 # S/p Renal Transplant (2011) Tacrolimus dose previously increased to 5 mg BID due to low levels.Creatinine stable 3.64 from 3.77 the day prior.AKI secondary to diuresis, with potential contribution of Tacrolimus. On chronic immunosuppressionwith Tacrolimus, Mycophenolateand Prednisone.  - Nephrology following; appreciate their recommendations -Monitor creatinine - Avoid nephrotoxic meds when able -Continue home Prednisoneand Mycophenolate - Tacrolimus 5 mg BID -Tacrolimus level: Target trough: 5-7. Repeat trough outpatient tomorrow morning with Dr. Candiss Norse  # Chronic Normocytic Anemia  Baseline hemoglobin between 7-8 for the past year. On admission hemoglobin of 8 that downtrended to 6.8, requiring 1 unit of pRBCs with appropriate response. No bleeding source identified. No evidence of iron deficiency on iron panel. Most consistent with anemia of chronickidneydisease. On Aranesp, started by Nephrology.Hgb stable.   -Monitor hemoglobin -Transfuse PRN for hemoglobin <7.0. - Aranesp per Nephrology  # Acute Gastroenteritis 2/2 Sapovirus Patient with notable gastro with +Sapovirus per GI panel. Diarrhea  improved with Imodium. Some looser textured stools today.     -Imodium PRN  # Right Upper ExtremityDVT First diagnosed in September 2021 with repeat venous duplex this admission showing persistent right axillary DVT with new SVT in the right cephalic vein. Patient is already anticoagulated with Eliquis; no medication changes indicated.   - Continue Eliquis 5 mg BID   # Hypertension Currently on Bidil for HFrEF, and Metoprolol.   - Bidil2 tablets TID - Metoprolol succinate 25 mg daily  # Type 2 Diabetes Mellitus  A1c of 5.6% on admission. Patient has not required any insulin since admission.SSI and glucose checks were subsequently discontinued.  #Progressive hearing loss: Will need outpatient audiology testing and ENT follow up   LOS: 16 days   [name], Medical Student 07/20/2020, 8:54 AM

## 2020-07-20 NOTE — Hospital Course (Addendum)
[name] is a 55 y.o. with PMH of HFrEF, PAF, CKD4 s/p renal transplant, T2DM, HTN, DVT on eliquis admit for acute on chronic systolic heart failure complicated by advanced CKD.    # Acute hypoxic respiratory failure # Acute Exacerbation of Chronic Biventricular Heart Failure  Patient presented in acute respiratory distress; markedly hypervolemic, ~100 lbs above her previous dry weight of 230 lbs; with BNP elevated to 2800; and soft blood pressures. Consequently followed by heart failure team, and diuresed on IV Lasix, with later transition to p.o Torsemide. Supplemental oxygen weaned as tolerated; improvement to respiratory status with diuresis. Patient near euvolemic, with weight of 273 lbs prior to discharge. Discharged on Metoprolol 25 mg, and Torsemide 80 mg daily per Cardiology recommendations.   # Acute Kidney Injury  # Chronic Kidney Disease Stage 4 # S/p Renal Transplant (2011)  Patient notably s/p renal transplant in 2011, with Stage 4 CKD; on immunosuppressants. Patient baseline creatinine previously noted as 1.7 on visit with [name] on 04/27/20. Notably elevated on admission. Followed by Nephrology team during time here, with close monitoring of creatine in light of diuresis. Elevation of creatinine to 3.64 with diuresis. Tacrolimus dose increased to 5mg  BID in light of low levels; with continuation of home Mycophenolate and Prednisone. Per Nephrology recommendations, repeat Tacrolimus trough needed on 1/14, with target range of 5-7.   Hypertension Patient with history of chronic hypertension. Transitioned to daily Metoprolol 25 mg; and increased dose of Bidil 2 tablets TID per Cardiology recommendations, with some improvement to blood pressure. Avoiding nephrotoxic agents given CKD history.    # Chronic Normocytic Anemia  Patient Stage 4 CKD. Baseline hemoglobin between 7-8 for the past year. On admission hemoglobin of 8 that downtrended to 6.8, requiring  pRBCs units with appropriate response. No bleeding source identified. No evidence of iron deficiency on iron panel. Most consistent with anemia of chronic kidney disease. Nephrology started weekly Aranesp (given on 1/8). Hgb stable. Recommend follow up CBC.      Hypokalemia Patient hypokalemic to 3.0 on arrival in the context of decompensated heart failure. Received scheduled p.o. potassium repletion BID. Potassium of 3.4 on discharge. Recommend Follow up BMP.    Progressive hearing loss Patient reporting bilateral hearing loss starting days prior to admission, with no significant abnormalities appreciated on physical exam, and Weber-Rhinne inconclusive. Will need outpatient audiology testing and ENT follow up.    Diarrhea Patient presented with some days duration of non-bloody diarrhea and subjective fevers, notably afebrile on admission. GI panel conducted during hospital course positive for Sapovirus. Improved on imodium.

## 2020-07-20 NOTE — Plan of Care (Signed)
  Problem: Education: Goal: Knowledge of General Education information will improve Description: Including pain rating scale, medication(s)/side effects and non-pharmacologic comfort measures Outcome: Adequate for Discharge   Problem: Health Behavior/Discharge Planning: Goal: Ability to manage health-related needs will improve Outcome: Adequate for Discharge   Problem: Clinical Measurements: Goal: Ability to maintain clinical measurements within normal limits will improve Outcome: Adequate for Discharge Goal: Will remain free from infection Outcome: Adequate for Discharge Goal: Diagnostic test results will improve Outcome: Adequate for Discharge Goal: Respiratory complications will improve Outcome: Adequate for Discharge Goal: Cardiovascular complication will be avoided Outcome: Adequate for Discharge   Problem: Activity: Goal: Risk for activity intolerance will decrease Outcome: Adequate for Discharge   Problem: Coping: Goal: Level of anxiety will decrease Outcome: Adequate for Discharge   Problem: Elimination: Goal: Will not experience complications related to bowel motility Outcome: Adequate for Discharge Goal: Will not experience complications related to urinary retention Outcome: Adequate for Discharge   Problem: Safety: Goal: Ability to remain free from injury will improve Outcome: Adequate for Discharge   Problem: Skin Integrity: Goal: Risk for impaired skin integrity will decrease Outcome: Adequate for Discharge

## 2020-07-20 NOTE — TOC Transition Note (Signed)
Transition of Care [facility]) - CM/SW Discharge Note   Patient Details  Name: MEIAH ZAMUDIO MRN: [id number] Date of Birth: [birth year]  [facility]) CM/SW Contact:  Bethann Berkshire, [place] Phone Number: 07/20/2020, 2:32 PM   Clinical Narrative:     Patient will DC to: [facility] date: 07/20/2020 Family notified: Benjaman Lobe (Daughter)  [phone] (Mobile) Transport by: [facility] - Non emergency ambulance   Per MD patient ready for DC to [facility] . RN, patient, patient's family, and facility notified of DC. Discharge Summary and FL2 sent to facility. RN to call report prior to discharge [phone]). DC packet on chart. Ambulance transport requested for patient.   CSW will sign off for now as social work intervention is no longer needed. Please consult [place] again if new needs arise.   Final next level of care: Skilled Nursing Facility Barriers to Discharge: No Barriers Identified   Patient Goals and CMS Choice Patient states their goals for this hospitalization and ongoing recovery are:: SNF for rehab CMS Medicare.gov Compare Post Acute Care list provided to:: Patient Choice offered to / list presented to : Patient  Discharge Placement              Patient chooses bed at: [facility] Patient to be transferred to facility by: [facility] - Non emergency ambulance Name of family member notified: Benjaman Lobe (Daughter)   [phone] (Mobile) Patient and family notified of of transfer: 07/20/20  Discharge Plan and Services                                     Social Determinants of Health (SDOH) Interventions     Readmission Risk Interventions No flowsheet data found.

## 2020-07-20 NOTE — Progress Notes (Signed)
[facility] Progress Note   Assessment/ Plan:   1. AKI on CKD stage IV: Cardiorenal syndrome in setting of decompensated CHF. Baseline Cr estimated to be .7 at outpatient visit 04/2020. Recent review of Cr suggest baseline ~2.5.  - Continue mycophenolate, and prednisone  - Continues to have adequate urine output. Cr/BUN continues to trend up slowly - Will need outpatient follow up with her transplant center.  - subtherapeutic prograf levels (true trough), inc'ed prograf to 5+5 on 1/12. Target trough: 5-7. Repeat trough in AM if still here. Advised patient to call the office and her transplant team to be set up for follow up labs -would be okay for discharge from a nephrology perspective 2.Acute on chronic systolic heart failure with hypoxic respiratory failure - AHF has signed off. Final recommendations to continue Torsemide 80 mg daily, Bidil 2 tabs BID, Toprolol XL 25, Amio 200 daily , Eliquis 5 BID, and Atorva 40. - Wean supplemental oxygen as tolerated 3. Anemia of CKD: Normocytic anemia, on B12 and ferrous sulfate. Aranesp 27mcg given 1/8. Hgb stable today at 7.8  4. Secondary hyperparathyroidism from CKD, chronic condition: - Ca corrected for albumin  - Continue Calcitriol, Calcium - vitamin D 5. Nutrition - Renal diet  Gean Quint, MD [facility] Subjective:   [name] is a 55 y.o. with PMH of HFrEF, PAF, CKD4 s/p renal transplant, T2DM, HTN, DVT of RUE on Eliquis admitted for acute on chronic heart failure on hospital day  65.  No acute events. No complaints currently.    Objective:   BP (!) 146/83 (BP Location: Right Wrist)   Pulse 71   Temp 98.4 F (36.9 C) (Oral)   Resp 12   Ht 5\' 11"  (1.803 m)   Wt 123.8 kg   LMP 11/05/2012   SpO2 99%   BMI 38.08 kg/m   Physical Exam: Gen: Chronically ill appearing, lying in bed in NAD  CVS: Regular rate and rhythm, no murmurs rubs or gallops Resp: CTAB, no wheezes, rhonchi, or rales Abd:  obese, active bowel sounds, non tender, non distended, no tenderness over transplant site  Ext: Dependent 2+ edema in thighs , RUE in ace wrap  Labs: BMET Recent Labs  Lab 07/14/20 0253 07/15/20 0815 07/16/20 0423 07/17/20 0500 07/18/20 0700 07/19/20 0327 07/20/20 0322  NA 142 143 143 142 143 143 142  K 3.9 3.2* 3.4* 3.4* 3.4* 3.6 3.4*  CL 106 104 107 105 105 105 106  CO2 23 24 25 24 25 24 23   GLUCOSE 124* 89 91 98 81 102* 107*  BUN 53* 57* 58* 61* 63* 66* 66*  CREATININE 3.39* 3.47* 3.59* 3.58* 3.61* 3.77* 3.64*  CALCIUM 7.2* 7.1* 7.3* 7.3* 7.3* 7.1* 7.2*  PHOS 4.9* 4.6 4.9* 4.9* 4.8* 4.5 4.4   CBC Recent Labs  Lab 07/17/20 0500 07/18/20 0700 07/19/20 0327 07/20/20 0322  WBC 6.1 6.1 5.8 6.4  HGB 7.5* 7.7* 7.8* 7.7*  HCT 24.4* 25.1* 25.4* 25.6*  MCV 94.6 94.4 94.8 95.2  PLT 171 150 177 163      Medications:    . sodium chloride   Intravenous Once  . amiodarone  200 mg Oral Daily  . apixaban  5 mg Oral BID  . atorvastatin  40 mg Oral Daily  . calcitRIOL  0.5 mcg Oral Daily  . calcium-vitamin D  1 tablet Oral BID  . Chlorhexidine Gluconate Cloth  6 each Topical Daily  . COVID-19 mRNA vaccine (Pfizer)  0.3 mL  Intramuscular Once  . darbepoetin (ARANESP) injection - NON-DIALYSIS  60 mcg Subcutaneous Q Sat-1800  . DULoxetine  30 mg Oral Daily  . ferrous sulfate  325 mg Oral BID WC  . guaiFENesin  600 mg Oral BID  . isosorbide-hydrALAZINE  2 tablet Oral TID  . metoprolol succinate  25 mg Oral Daily  . mycophenolate  540 mg Oral BID  . pantoprazole  40 mg Oral Daily  . potassium chloride  40 mEq Oral Once  . potassium chloride  40 mEq Oral BID  . predniSONE  5 mg Oral Q breakfast  . sodium chloride flush  10-40 mL Intracatheter Q12H  . sodium chloride flush  3 mL Intravenous Q12H  . tacrolimus  5 mg Oral BID  . torsemide  80 mg Oral Daily  . vitamin B-12  1,000 mcg Oral Daily  . Vitamin D (Ergocalciferol)  50,000 Units Oral Q Sat

## 2020-07-20 NOTE — Progress Notes (Signed)
Orthopedic Tech Progress Note Patient Details:  [name] 10-30-1965 [id number]  Ortho Devices Type of Ortho Device: Louretta Parma boot Ortho Device/Splint Location: Bilateral Ortho Device/Splint Interventions: Application,Ordered   Post Interventions Patient Tolerated: Well Instructions Provided: Care of device,Poper ambulation with device   [name] A Gerold Sar 07/20/2020, 1:56 PM

## 2020-07-20 NOTE — Discharge Summary (Signed)
Name: [name] MRN: [id number] DOB: 26-Feb-1966 55 y.o. PCP: Tracey Heck, DO  Date of Admission: 07/04/2020  5:51 PM Date of Discharge: 07/20/2020 Attending Physician: Tracey Contes MD   Discharge Diagnosis: 1. Acute on Chronic Heart Failure  2. Acute Hypoxic Respiratory Failure 3. Acute kidney injury 4. CKD Stage 4 5. Chronic normocytic anemia  6. Atrial Fibrillation 7. Acute gastroenteritis 2/2 sapovirus 8. RUE DVT  9. HTN 10. Progressive hearing loss   Discharge Medications: Allergies as of 07/20/2020      Reactions   Infed [iron Dextran] Other (See Comments)   Pt has chest pain  Chest pain   Cefazolin Itching, Nausea And Vomiting   Ancef [cefazolin Sodium] Other (See Comments)   Tolerated rocephin 11/03/10 Reports ancef causes itching, sweating, vomiting (x2-2009?)   Lisinopril Swelling   Other    Pt may be allergic to another antibiotic daughter isn't sure and pt unable to verify.      Medication List    STOP taking these medications   furosemide 80 MG tablet Commonly known as: Lasix   hydrALAZINE 10 MG tablet Commonly known as: APRESOLINE   isosorbide mononitrate 30 MG 24 hr tablet Commonly known as: IMDUR     TAKE these medications   acetaminophen 325 MG tablet Commonly known as: TYLENOL Take 2 tablets (650 mg total) by mouth every 6 (six) hours as needed for mild pain (or Fever >/= 101).   albuterol (2.5 MG/3ML) 0.083% nebulizer solution Commonly known as: PROVENTIL Take 3 mLs (2.5 mg total) by nebulization every 6 (six) hours as needed for wheezing or shortness of breath.   amiodarone 200 MG tablet Commonly known as: PACERONE Take 1 tablet (200 mg total) by mouth daily. Start taking on: July 21, 2020   apixaban 5 MG Tabs tablet Commonly known as: ELIQUIS Take 1 tablet (5 mg total) by mouth 2 (two) times daily. What changed: Another medication with the same name was removed. Continue taking this medication, and follow the  directions you see here.   atorvastatin 40 MG tablet Commonly known as: LIPITOR Take 1 tablet (40 mg total) by mouth daily.   calcitRIOL 0.5 MCG capsule Commonly known as: ROCALTROL Take 1 capsule (0.5 mcg total) by mouth daily.   calcium-vitamin D 500-200 MG-UNIT tablet Commonly known as: OSCAL WITH D Take 1 tablet by mouth 2 (two) times daily.   Darbepoetin Alfa 60 MCG/0.3ML Sosy injection Commonly known as: ARANESP Inject 0.3 mLs (60 mcg total) into the skin every Saturday at 6 PM. Start taking on: July 22, 2020   DULoxetine 30 MG capsule Commonly known as: CYMBALTA Take 1 capsule (30 mg total) by mouth daily.   ferrous sulfate 325 (65 FE) MG tablet Take 1 tablet (325 mg total) by mouth 2 (two) times daily with a meal.   guaiFENesin-dextromethorphan 100-10 MG/5ML syrup Commonly known as: ROBITUSSIN DM Take 5 mLs by mouth every 4 (four) hours as needed for cough.   insulin aspart 100 UNIT/ML injection Commonly known as: novoLOG Inject 0-20 Units into the skin 3 (three) times daily with meals.   isosorbide-hydrALAZINE 20-37.5 MG tablet Commonly known as: BIDIL Take 1.5 tablets by mouth 3 (three) times daily.   loperamide 2 MG capsule Commonly known as: IMODIUM Take 2 capsules (4 mg total) by mouth daily as needed for diarrhea or loose stools.   loratadine 10 MG tablet Commonly known as: CLARITIN Take 1 tablet (10 mg total) by mouth daily.   metoprolol succinate  25 MG 24 hr tablet Commonly known as: TOPROL-XL Take 1 tablet (25 mg total) by mouth daily.   mycophenolate 180 MG EC tablet Commonly known as: MYFORTIC TAKE 3 TABLETS BY MOUTH TWICE DAILY   omeprazole 40 MG capsule Commonly known as: PRILOSEC Take 40 mg by mouth daily.   predniSONE 5 MG tablet Commonly known as: DELTASONE Take 1 tablet (5 mg total) by mouth daily with breakfast. Start taking on: July 21, 2020 What changed:   medication strength  how much to take   tacrolimus 5 MG  capsule Commonly known as: Prograf Take 1 capsule (5 mg total) by mouth 2 (two) times daily. What changed:   medication strength  how much to take   torsemide 20 MG tablet Commonly known as: DEMADEX Take 4 tablets (80 mg total) by mouth daily. Start taking on: July 21, 2020   triamcinolone ointment 0.1 % Commonly known as: KENALOG Apply topically 2 (two) times daily as needed.   vitamin B-12 1000 MCG tablet Commonly known as: CYANOCOBALAMIN Take 1 tablet (1,000 mcg total) by mouth daily.       Disposition and follow-up:   [name] was discharged from [facility] in Stable condition.  At the hospital follow up visit please address:  1.  Heart Failure- medications were adjusted during hospital stay, please assess compliance.       AKI- patient's Cr remained elevated after extensive diuresis. Her transplant medication               Tacrolimus was also adjusted during hospitalization. She will need close follow up with her                 transplant doctor.   2.  Labs / imaging needed at time of follow-up: BMP, CBC  3.  Pending labs/ test needing follow-up: None  Follow-up Appointments:  Contact information for after-discharge care    Destination    [facility] Preferred SNF .   Service: Skilled Nursing Contact information: [address] [place] Hospital Course by problem list:  1. Acute on Chronic Heart Failure - Patient presented in acute respiratory distress requiring supplemental oxygen; markedly hypervolemic, ~100 lbs above her previous dry weight of 230 lbs; with BNP elevated to 2800; and soft blood pressures.  She was recently diagnosed with biventricular heart failure in September 2021 likely secondary to hypertension versus ischemic cardiomyopathy.  Consequently followed by heart failure team, and diuresed on very high doses of IV Lasix, with later  transition to p.o Torsemide. Supplemental oxygen weaned as tolerated; improvement to respiratory status with diuresis. Patient near euvolemic, with weight of 273 lbs prior to discharge. Discharged on Metoprolol 25 mg, BiDil 2 tablets 3 times daily and Torsemide 80 mg daily per Cardiology recommendations.   With chronic kidney disease, no ARB/Arni/spironolactone or digoxin  2. Acute Hypoxic Respiratory Failure-as above.  3. Acute kidney injury on CKD stage IV- cardiorenal syndrome in the setting of decompensated heart failure.  Patient notably s/p renal transplant in 2011, with Stage 4 CKD; on immunosuppressants. Patient baseline creatinine previously noted as 1.7 on visit with [place] on 04/27/20. Notably elevated on admission. Followed by Nephrology team during time here, with close monitoring of creatine in light of diuresis. Elevation of creatinine to 3.64 with diuresis. Tacrolimus dose increased to 5mg  BID in  light of low levels; with continuation of home Mycophenolate and Prednisone. Per Nephrology recommendations, repeat Tacrolimus trough needed on 1/14, with target range of 5-7.  Nephrology also suggested baseline is approximately 2.5.  4. Hypokalemia- Patient hypokalemic to 3.0 on arrival in the context of decompensated heart failure.  Replenished during hospital course.  Recommend follow-up BMP.  5. Chronic normocytic anemia- Patient Stage 4 CKD. Baseline hemoglobin between 7-8 for the past year. On admission hemoglobin of 8 that downtrended to 6.8, requiring pRBCs units with appropriate response. No bleeding source identified. No evidence of iron deficiency on iron panel. Most consistent with anemia of chronickidneydisease.Nephrology started weekly Aranesp (given on 1/8).Hgb stable.Recommend follow up CBC.    6. Atrial Fibrillation-with RVR during admission and started on amnio drip which was transitioned to oral amiodarone.  Continued apixaban.  7. Acute gastroenteritis  2/2 sapovirus- Patient presented with some days duration of non-bloody diarrhea and subjective fevers, notably afebrile on admission. GI panel conducted during hospital course positive for Sapovirus. Improved on imodium.  8. RUE DVT -patient has chronic right upper extremity swelling.  Right upper extremity Doppler showed age-indeterminate DVT of the right axillary vein and age-indeterminate superficial vein thrombosis of the right cephalic vein, unchanged from prior examinations.  On apixaban.  9. HTN- Patient with history of chronic hypertension. Transitioned to daily Metoprolol 25 mg; and increased dose of Bidil 2 tablets TID per Cardiology recommendations, with some improvement to blood pressure. Avoiding nephrotoxic agents given CKD history.   10. Progressive hearing loss - Patient reporting bilateral hearing loss starting days prior to admission, with no significant abnormalities appreciated on physical exam, and Weber-Rhinne inconclusive. Will need outpatient audiology testing and ENT follow up.    Discharge Vitals:   BP (!) 146/83 (BP Location: Right Wrist)   Pulse 71   Temp 98.4 F (36.9 C) (Oral)   Resp 12   Ht 5\' 11"  (1.803 m)   Wt 123.8 kg   LMP 11/05/2012   SpO2 99%   BMI 38.08 kg/m   Pertinent Labs, Studies, and Procedures:   CBC Latest Ref Rng & Units 07/20/2020 07/19/2020 07/18/2020  WBC 4.0 - 10.5 K/uL 6.4 5.8 6.1  Hemoglobin 12.0 - 15.0 g/dL 7.7(L) 7.8(L) 7.7(L)  Hematocrit 36.0 - 46.0 % 25.6(L) 25.4(L) 25.1(L)  Platelets 150 - 400 K/uL 163 177 150   BMP Latest Ref Rng & Units 07/20/2020 07/19/2020 07/18/2020  Glucose 70 - 99 mg/dL 107(H) 102(H) 81  BUN 6 - 20 mg/dL 66(H) 66(H) 63(H)  Creatinine 0.44 - 1.00 mg/dL 3.64(H) 3.77(H) 3.61(H)  BUN/Creat Ratio 9 - 23 - - -  Sodium 135 - 145 mmol/L 142 143 143  Potassium 3.5 - 5.1 mmol/L 3.4(L) 3.6 3.4(L)  Chloride 98 - 111 mmol/L 106 105 105  CO2 22 - 32 mmol/L 23 24 25   Calcium 8.9 - 10.3 mg/dL 7.2(L) 7.1(L) 7.3(L)    Filed Weights   07/18/20 0500 07/19/20 0229 07/20/20 0409  Weight: 129.3 kg 124.3 kg 123.8 kg   DG Chest Port 1 View  Result Date: 07/04/2020 CLINICAL DATA:  Short of breath, sepsis EXAM: PORTABLE CHEST 1 VIEW COMPARISON:  03/16/2020 FINDINGS: Single frontal view of the chest demonstrates an enlarged cardiac silhouette. There is central vascular congestion, with increased interstitial prominence, bibasilar consolidation, and bilateral effusions. Changes are more pronounced on the left. No pneumothorax. IMPRESSION: 1. Findings most consistent with congestive heart failure. Superimposed infection would be difficult to exclude. Electronically Signed   By: Diana Eves.D.  On: 07/04/2020 18:38   ECHOCARDIOGRAM COMPLETE  Result Date: 07/05/2020    ECHOCARDIOGRAM REPORT   Patient Name:   [name] Date of Exam: 07/05/2020 Medical Rec #:  [id number]       Height:       71.0 in Accession #:    [id number]      Weight:       235.0 lb Date of Birth:  07-02-1966       BSA:          2.258 m Patient Age:    61 years        BP:           101/66 mmHg Patient Gender: F               HR:           70 bpm. Exam Location:  Inpatient Procedure: 2D Echo, Cardiac Doppler and Color Doppler Indications:    I50.23 Acute on chronic systolic (congestive) heart failure  History:        Patient has prior history of Echocardiogram examinations, most                 recent 03/16/2020. Risk Factors:Hypertension and Diabetes. GERD.                 Renal Transplant.  Sonographer:    Jonelle Sidle Dance Referring Phys: 9 [name]  Sonographer Comments: No subcostal window. IMPRESSIONS  1. Left ventricular ejection fraction, by estimation, is 25 to 30%. The left ventricle has severely decreased function. The left ventricle demonstrates global hypokinesis. The left ventricular internal cavity size was mildly dilated. There is severe left ventricular hypertrophy. Left ventricular diastolic parameters are consistent with Grade  III diastolic dysfunction (restrictive). Elevated left atrial pressure.  2. Right ventricular systolic function is moderately reduced. The right ventricular size is mildly enlarged. There is mildly elevated pulmonary artery systolic pressure. The estimated right ventricular systolic pressure is 92.1 mmHg.  3. Left atrial size was mildly dilated.  4. The mitral valve is normal in structure. Mild mitral valve regurgitation.  5. Tricuspid valve regurgitation is mild to moderate.  6. The aortic valve is tricuspid. There is moderate calcification of the aortic valve. Aortic valve regurgitation is moderate. Mild to moderate aortic valve sclerosis/calcification is present, without any evidence of aortic stenosis. FINDINGS  Left Ventricle: Left ventricular ejection fraction, by estimation, is 25 to 30%. The left ventricle has severely decreased function. The left ventricle demonstrates global hypokinesis. The left ventricular internal cavity size was mildly dilated. There is severe left ventricular hypertrophy. Left ventricular diastolic parameters are consistent with Grade III diastolic dysfunction (restrictive). Elevated left atrial pressure. Right Ventricle: The right ventricular size is mildly enlarged. Right vetricular wall thickness was not well visualized. Right ventricular systolic function is moderately reduced. There is mildly elevated pulmonary artery systolic pressure. The tricuspid  regurgitant velocity is 2.61 m/s, and with an assumed right atrial pressure of 8 mmHg, the estimated right ventricular systolic pressure is 19.4 mmHg. Left Atrium: Left atrial size was mildly dilated. Right Atrium: Right atrial size was normal in size. Pericardium: Trivial pericardial effusion is present. Mitral Valve: The mitral valve is normal in structure. Mild mitral valve regurgitation. Tricuspid Valve: The tricuspid valve is normal in structure. Tricuspid valve regurgitation is mild to moderate. Aortic Valve: The aortic valve  is tricuspid. There is moderate calcification of the aortic valve. Aortic valve regurgitation is moderate. Aortic regurgitation PHT measures 502 msec.  Mild to moderate aortic valve sclerosis/calcification is present, without any evidence of aortic stenosis. Pulmonic Valve: The pulmonic valve was grossly normal. Pulmonic valve regurgitation is mild to moderate. Aorta: The aortic root and ascending aorta are structurally normal, with no evidence of dilitation. IAS/Shunts: The interatrial septum was not well visualized. Additional Comments: There is a small pleural effusion in the left lateral region.  LEFT VENTRICLE PLAX 2D LVIDd:         6.70 cm LVIDs:         5.20 cm LV PW:         1.50 cm LV IVS:        1.50 cm LVOT diam:     2.10 cm LV SV:         75 LV SV Index:   33 LVOT Area:     3.46 cm  RIGHT VENTRICLE RV Basal diam:  3.70 cm RV S prime:     7.51 cm/s TAPSE (M-mode): 1.5 cm LEFT ATRIUM              Index       RIGHT ATRIUM           Index LA diam:        6.20 cm  2.75 cm/m  RA Area:     21.20 cm LA Vol (A2C):   103.0 ml 45.61 ml/m RA Volume:   53.80 ml  23.82 ml/m LA Vol (A4C):   74.7 ml  33.08 ml/m LA Biplane Vol: 90.7 ml  40.16 ml/m  AORTIC VALVE LVOT Vmax:   121.95 cm/s LVOT Vmean:  75.600 cm/s LVOT VTI:    0.216 m AI PHT:      502 msec  AORTA Ao Root diam: 3.30 cm Ao Asc diam:  3.60 cm MITRAL VALVE               TRICUSPID VALVE MV Area (PHT): 3.77 cm    TR Peak grad:   27.2 mmHg MV Decel Time: 201 msec    TR Vmax:        261.00 cm/s MV E velocity: 98.50 cm/s MV A velocity: 48.50 cm/s  SHUNTS MV E/A ratio:  2.03        Systemic VTI:  0.22 m                            Systemic Diam: 2.10 cm Oswaldo Milian MD Electronically signed by Oswaldo Milian MD Signature Date/Time: 07/05/2020/2:37:26 PM    Final     Discharge Instructions: Discharge Instructions    (HEART FAILURE PATIENTS) Call MD:  Anytime you have any of the following symptoms: 1) 3 pound weight gain in 24 hours or 5 pounds  in 1 week 2) shortness of breath, with or without a dry hacking cough 3) swelling in the hands, feet or stomach 4) if you have to sleep on extra pillows at night in order to breathe.   Complete by: As directed    Call MD for:  difficulty breathing, headache or visual disturbances   Complete by: As directed    Call MD for:  temperature >100.4   Complete by: As directed    Diet - low sodium heart healthy   Complete by: As directed    Discharge instructions   Complete by: As directed    [name],   You were hospitalized due to your severe heart failure. You were treated with IV diuretics and other  medications to help your heart pump. Your kidney was closely followed by the kidney doctor's as well and you will need to follow up with both heart failure and your transplant nephrologist in 1-2 weeks to continue adjusting your medications. I also want you to follow up with your PCP in 1 week.   Please note the changes in your medications:  START Torsemide 80 mg daily START Bidil 2 tables three times daily  START Amiodarone 200 mg daily  START Aranesp 2 times weekly   Continue Eliquis 5 mg twice daily   INCREASE prednisone to 5mg  daily  INCREASE Tacrolimus to 5 mg twice daily   Continue the rest of your medications as prescribed.  We wish you the best of luck at your skilled nursing facility!   Increase activity slowly   Complete by: As directed       Signed: Jadrian Bulman N, DO 07/20/2020, 12:00 PM

## 2020-07-20 NOTE — Progress Notes (Signed)
Physical Therapy Treatment Patient Details Name: [name] MRN: [id number] DOB: Aug 01, 1965 Today's Date: 07/20/2020    History of Present Illness [name] is 55yo female with HFrEF (Echo 03/2020: EF 25-30%), paroxysmal atrial fibrillation, CKD IV s/p renal transplant on chronic immunosuppression, hypertension, type II diabetes mellitus, hx DVT of R axillary vein on Eliquis, GERD admitted 12/28 for acute on chronic heart failure. Pt also with diarrhea due to + Sapovirus    PT Comments    On arrival to room pt reported she was in need of cleaning up. Pt was sitting in large volume of liquid stool and required max A +2 to roll and for peri care. Once completed pt was very fatigued but agreeable to some exercises. Attempted to place bed in chair position at end of session, however her bed type does not lower the LEs, so she was instead placed in long sitting and agreed to attempt sitting up for ~1 hour. Would do well to progress EOB next session if her activity tolerance will allow.    Follow Up Recommendations  SNF     Equipment Recommendations  Wheelchair (measurements PT);Wheelchair cushion (measurements PT);Hospital bed;Other (comment);3in1 (PT)    Recommendations for Other Services       Precautions / Restrictions Precautions Precautions: Fall Precaution Comments: monitor vitals with all movement Restrictions Weight Bearing Restrictions: No    Mobility  Bed Mobility Overal bed mobility: Needs Assistance Bed Mobility: Rolling Rolling: Max assist;+2 for physical assistance         General bed mobility comments: Pt rolled for peri care with max A +2. Multimodal cues for hand placement with rails. Pt struggling to bend LEs to assist in rolling.  Transfers                    Ambulation/Gait                 Stairs             Wheelchair Mobility    Modified Rankin (Stroke Patients Only)       Balance Overall balance assessment:  Needs assistance             Standing balance comment: unable                            Cognition Arousal/Alertness: Awake/alert Behavior During Therapy: WFL for tasks assessed/performed Overall Cognitive Status: Within Functional Limits for tasks assessed                                        Exercises General Exercises - Lower Extremity Quad Sets: AROM;Both;10 reps;Supine Heel Slides: AAROM;Both;10 reps;Supine Other Exercises Other Exercises: HOB elevated and pt lifted trunk into long sitting 3x. She maintained long sitting for ~10 seconds each time. Rest required secondary to Back pain. Mod A given to elevate trunk.    General Comments        Pertinent Vitals/Pain Pain Assessment: Faces Faces Pain Scale: Hurts little more Pain Location: Bil Knees R shoulder Pain Descriptors / Indicators: Tender Pain Intervention(s): Monitored during session;Limited activity within patient's tolerance;Repositioned    Home Living                      Prior Function            PT Goals (  current goals can now be found in the care plan section) Acute Rehab PT Goals Patient Stated Goal: to get stronger PT Goal Formulation: With patient Time For Goal Achievement: 07/19/20 Potential to Achieve Goals: Fair Progress towards PT goals: Progressing toward goals    Frequency    Min 2X/week      PT Plan Current plan remains appropriate    Co-evaluation              AM-PAC PT "6 Clicks" Mobility   Outcome Measure  Help needed turning from your back to your side while in a flat bed without using bedrails?: A Lot Help needed moving from lying on your back to sitting on the side of a flat bed without using bedrails?: A Lot Help needed moving to and from a bed to a chair (including a wheelchair)?: A Lot Help needed standing up from a chair using your arms (e.g., wheelchair or bedside chair)?: A Lot Help needed to walk in hospital room?:  Total Help needed climbing 3-5 steps with a railing? : Total 6 Click Score: 10    End of Session Equipment Utilized During Treatment: Oxygen Activity Tolerance: Patient limited by pain;Patient limited by fatigue Patient left: in bed;with call bell/phone within reach;with bed alarm set (bed in long sitting position.) Nurse Communication: Mobility status PT Visit Diagnosis: Other abnormalities of gait and mobility (R26.89);Muscle weakness (generalized) (M62.81);Pain Pain - Right/Left:  (ba) Pain - part of body:  (back)     Time: 1020-1102 PT Time Calculation (min) (ACUTE ONLY): 42 min  Charges:  $Therapeutic Exercise: 8-22 mins $Therapeutic Activity: 23-37 mins                     Benjiman Core, [place] Pager [phone] Acute Rehab   Allena Katz 07/20/2020, 12:07 PM

## 2020-07-26 DIAGNOSIS — G894 Chronic pain syndrome: Secondary | ICD-10-CM | POA: Diagnosis not present

## 2020-07-26 DIAGNOSIS — M25562 Pain in left knee: Secondary | ICD-10-CM | POA: Diagnosis not present

## 2020-07-26 DIAGNOSIS — M25561 Pain in right knee: Secondary | ICD-10-CM | POA: Diagnosis not present

## 2020-07-31 ENCOUNTER — Inpatient Hospital Stay (HOSPITAL_COMMUNITY)
Admission: EM | Admit: 2020-07-31 | Discharge: 2020-08-06 | DRG: 812 | Disposition: A | Discharge status: Skilled Nursing Facility | Payer: Medicare Other | Source: Skilled Nursing Facility | Attending: Internal Medicine | Admitting: Internal Medicine

## 2020-07-31 ENCOUNTER — Encounter (HOSPITAL_COMMUNITY): Payer: Self-pay | Admitting: *Deleted

## 2020-07-31 DIAGNOSIS — Z883 Allergy status to other anti-infective agents status: Secondary | ICD-10-CM

## 2020-07-31 DIAGNOSIS — E785 Hyperlipidemia, unspecified: Secondary | ICD-10-CM | POA: Diagnosis present

## 2020-07-31 DIAGNOSIS — E876 Hypokalemia: Secondary | ICD-10-CM | POA: Diagnosis not present

## 2020-07-31 DIAGNOSIS — E1122 Type 2 diabetes mellitus with diabetic chronic kidney disease: Secondary | ICD-10-CM | POA: Diagnosis present

## 2020-07-31 DIAGNOSIS — Z888 Allergy status to other drugs, medicaments and biological substances status: Secondary | ICD-10-CM

## 2020-07-31 DIAGNOSIS — D631 Anemia in chronic kidney disease: Secondary | ICD-10-CM | POA: Diagnosis present

## 2020-07-31 DIAGNOSIS — D84821 Immunodeficiency due to drugs: Secondary | ICD-10-CM | POA: Diagnosis present

## 2020-07-31 DIAGNOSIS — D696 Thrombocytopenia, unspecified: Secondary | ICD-10-CM | POA: Diagnosis not present

## 2020-07-31 DIAGNOSIS — K219 Gastro-esophageal reflux disease without esophagitis: Secondary | ICD-10-CM | POA: Diagnosis present

## 2020-07-31 DIAGNOSIS — R053 Chronic cough: Secondary | ICD-10-CM | POA: Diagnosis present

## 2020-07-31 DIAGNOSIS — I480 Paroxysmal atrial fibrillation: Secondary | ICD-10-CM | POA: Diagnosis present

## 2020-07-31 DIAGNOSIS — I5043 Acute on chronic combined systolic (congestive) and diastolic (congestive) heart failure: Secondary | ICD-10-CM | POA: Diagnosis not present

## 2020-07-31 DIAGNOSIS — Z7901 Long term (current) use of anticoagulants: Secondary | ICD-10-CM

## 2020-07-31 DIAGNOSIS — Z8249 Family history of ischemic heart disease and other diseases of the circulatory system: Secondary | ICD-10-CM

## 2020-07-31 DIAGNOSIS — I130 Hypertensive heart and chronic kidney disease with heart failure and stage 1 through stage 4 chronic kidney disease, or unspecified chronic kidney disease: Secondary | ICD-10-CM | POA: Diagnosis not present

## 2020-07-31 DIAGNOSIS — I5020 Unspecified systolic (congestive) heart failure: Secondary | ICD-10-CM | POA: Diagnosis present

## 2020-07-31 DIAGNOSIS — Z833 Family history of diabetes mellitus: Secondary | ICD-10-CM

## 2020-07-31 DIAGNOSIS — Z800 Family history of malignant neoplasm of digestive organs: Secondary | ICD-10-CM

## 2020-07-31 DIAGNOSIS — I5082 Biventricular heart failure: Secondary | ICD-10-CM | POA: Diagnosis present

## 2020-07-31 DIAGNOSIS — Z20822 Contact with and (suspected) exposure to covid-19: Secondary | ICD-10-CM | POA: Diagnosis present

## 2020-07-31 DIAGNOSIS — Z940 Kidney transplant status: Secondary | ICD-10-CM

## 2020-07-31 DIAGNOSIS — D62 Acute posthemorrhagic anemia: Principal | ICD-10-CM | POA: Diagnosis present

## 2020-07-31 DIAGNOSIS — T8619 Other complication of kidney transplant: Secondary | ICD-10-CM | POA: Diagnosis present

## 2020-07-31 DIAGNOSIS — Z79899 Other long term (current) drug therapy: Secondary | ICD-10-CM

## 2020-07-31 DIAGNOSIS — R5383 Other fatigue: Secondary | ICD-10-CM | POA: Diagnosis not present

## 2020-07-31 DIAGNOSIS — M6281 Muscle weakness (generalized): Secondary | ICD-10-CM | POA: Diagnosis not present

## 2020-07-31 DIAGNOSIS — D649 Anemia, unspecified: Secondary | ICD-10-CM | POA: Diagnosis not present

## 2020-07-31 DIAGNOSIS — Z86718 Personal history of other venous thrombosis and embolism: Secondary | ICD-10-CM

## 2020-07-31 DIAGNOSIS — I5042 Chronic combined systolic (congestive) and diastolic (congestive) heart failure: Secondary | ICD-10-CM | POA: Diagnosis present

## 2020-07-31 DIAGNOSIS — N184 Chronic kidney disease, stage 4 (severe): Secondary | ICD-10-CM | POA: Diagnosis not present

## 2020-07-31 DIAGNOSIS — D6859 Other primary thrombophilia: Secondary | ICD-10-CM | POA: Diagnosis present

## 2020-07-31 DIAGNOSIS — Z6836 Body mass index (BMI) 36.0-36.9, adult: Secondary | ICD-10-CM

## 2020-07-31 DIAGNOSIS — D509 Iron deficiency anemia, unspecified: Secondary | ICD-10-CM | POA: Diagnosis present

## 2020-07-31 DIAGNOSIS — N179 Acute kidney failure, unspecified: Secondary | ICD-10-CM | POA: Diagnosis present

## 2020-07-31 DIAGNOSIS — Y830 Surgical operation with transplant of whole organ as the cause of abnormal reaction of the patient, or of later complication, without mention of misadventure at the time of the procedure: Secondary | ICD-10-CM | POA: Diagnosis present

## 2020-07-31 DIAGNOSIS — E669 Obesity, unspecified: Secondary | ICD-10-CM | POA: Diagnosis present

## 2020-07-31 DIAGNOSIS — Z9049 Acquired absence of other specified parts of digestive tract: Secondary | ICD-10-CM

## 2020-07-31 DIAGNOSIS — N2581 Secondary hyperparathyroidism of renal origin: Secondary | ICD-10-CM | POA: Diagnosis not present

## 2020-07-31 DIAGNOSIS — E8809 Other disorders of plasma-protein metabolism, not elsewhere classified: Secondary | ICD-10-CM | POA: Diagnosis present

## 2020-07-31 DIAGNOSIS — I132 Hypertensive heart and chronic kidney disease with heart failure and with stage 5 chronic kidney disease, or end stage renal disease: Secondary | ICD-10-CM | POA: Diagnosis present

## 2020-07-31 DIAGNOSIS — N185 Chronic kidney disease, stage 5: Secondary | ICD-10-CM | POA: Diagnosis present

## 2020-07-31 DIAGNOSIS — Z7952 Long term (current) use of systemic steroids: Secondary | ICD-10-CM

## 2020-07-31 DIAGNOSIS — F17210 Nicotine dependence, cigarettes, uncomplicated: Secondary | ICD-10-CM | POA: Diagnosis present

## 2020-07-31 DIAGNOSIS — Z86010 Personal history of colonic polyps: Secondary | ICD-10-CM

## 2020-07-31 DIAGNOSIS — K625 Hemorrhage of anus and rectum: Secondary | ICD-10-CM

## 2020-07-31 DIAGNOSIS — F32A Depression, unspecified: Secondary | ICD-10-CM | POA: Diagnosis present

## 2020-07-31 DIAGNOSIS — Z8261 Family history of arthritis: Secondary | ICD-10-CM

## 2020-07-31 LAB — COMPREHENSIVE METABOLIC PANEL
ALT: 15 U/L (ref 0–44)
AST: 15 U/L (ref 15–41)
Albumin: 2.9 g/dL — ABNORMAL LOW (ref 3.5–5.0)
Alkaline Phosphatase: 356 U/L — ABNORMAL HIGH (ref 38–126)
Anion gap: 14 (ref 5–15)
BUN: 100 mg/dL — ABNORMAL HIGH (ref 6–20)
CO2: 25 mmol/L (ref 22–32)
Calcium: 5.9 mg/dL — CL (ref 8.9–10.3)
Chloride: 103 mmol/L (ref 98–111)
Creatinine, Ser: 3.8 mg/dL — ABNORMAL HIGH (ref 0.44–1.00)
GFR, Estimated: 13 mL/min — ABNORMAL LOW (ref >60)
Glucose, Bld: 163 mg/dL — ABNORMAL HIGH (ref 70–99)
Potassium: 3.1 mmol/L — ABNORMAL LOW (ref 3.5–5.1)
Sodium: 142 mmol/L (ref 135–145)
Total Bilirubin: 0.6 mg/dL (ref 0.3–1.2)
Total Protein: 5 g/dL — ABNORMAL LOW (ref 6.5–8.1)

## 2020-07-31 LAB — CBC
HCT: 18.5 % — ABNORMAL LOW (ref 36.0–46.0)
Hemoglobin: 5.3 g/dL — CL (ref 12.0–15.0)
MCH: 28.6 pg (ref 26.0–34.0)
MCHC: 28.6 g/dL — ABNORMAL LOW (ref 30.0–36.0)
MCV: 100 fL (ref 80.0–100.0)
Platelets: 128 10*3/uL — ABNORMAL LOW (ref 150–400)
RBC: 1.85 MIL/uL — ABNORMAL LOW (ref 3.87–5.11)
RDW: 17.3 % — ABNORMAL HIGH (ref 11.5–15.5)
WBC: 5.8 10*3/uL (ref 4.0–10.5)
nRBC: 0 % (ref 0.0–0.2)

## 2020-07-31 NOTE — ED Triage Notes (Signed)
Pt arrived by [facility] from [facility]. Was sent here for abnormal lab- low Hbg of 5.4. Hx of same with anemia and blood transfusions. Pt has no complaints.

## 2020-08-01 ENCOUNTER — Encounter (HOSPITAL_COMMUNITY): Payer: Self-pay | Admitting: Internal Medicine

## 2020-08-01 ENCOUNTER — Other Ambulatory Visit: Payer: Self-pay

## 2020-08-01 DIAGNOSIS — E876 Hypokalemia: Secondary | ICD-10-CM | POA: Diagnosis not present

## 2020-08-01 DIAGNOSIS — Z940 Kidney transplant status: Secondary | ICD-10-CM | POA: Diagnosis not present

## 2020-08-01 DIAGNOSIS — D649 Anemia, unspecified: Secondary | ICD-10-CM | POA: Diagnosis present

## 2020-08-01 DIAGNOSIS — N179 Acute kidney failure, unspecified: Secondary | ICD-10-CM | POA: Diagnosis not present

## 2020-08-01 DIAGNOSIS — N184 Chronic kidney disease, stage 4 (severe): Secondary | ICD-10-CM | POA: Diagnosis not present

## 2020-08-01 DIAGNOSIS — F32A Depression, unspecified: Secondary | ICD-10-CM

## 2020-08-01 DIAGNOSIS — I130 Hypertensive heart and chronic kidney disease with heart failure and stage 1 through stage 4 chronic kidney disease, or unspecified chronic kidney disease: Secondary | ICD-10-CM | POA: Diagnosis not present

## 2020-08-01 DIAGNOSIS — I5043 Acute on chronic combined systolic (congestive) and diastolic (congestive) heart failure: Secondary | ICD-10-CM | POA: Diagnosis not present

## 2020-08-01 DIAGNOSIS — D696 Thrombocytopenia, unspecified: Secondary | ICD-10-CM | POA: Diagnosis not present

## 2020-08-01 DIAGNOSIS — I5082 Biventricular heart failure: Secondary | ICD-10-CM | POA: Diagnosis not present

## 2020-08-01 LAB — CBC WITH DIFFERENTIAL/PLATELET
Abs Immature Granulocytes: 0.03 10*3/uL (ref 0.00–0.07)
Basophils Absolute: 0 10*3/uL (ref 0.0–0.1)
Basophils Relative: 0 %
Eosinophils Absolute: 0.2 10*3/uL (ref 0.0–0.5)
Eosinophils Relative: 2 %
HCT: 24.5 % — ABNORMAL LOW (ref 36.0–46.0)
Hemoglobin: 7.4 g/dL — ABNORMAL LOW (ref 12.0–15.0)
Immature Granulocytes: 0 %
Lymphocytes Relative: 10 %
Lymphs Abs: 0.7 10*3/uL (ref 0.7–4.0)
MCH: 28.6 pg (ref 26.0–34.0)
MCHC: 30.2 g/dL (ref 30.0–36.0)
MCV: 94.6 fL (ref 80.0–100.0)
Monocytes Absolute: 0.3 10*3/uL (ref 0.1–1.0)
Monocytes Relative: 5 %
Neutro Abs: 5.7 10*3/uL (ref 1.7–7.7)
Neutrophils Relative %: 83 %
Platelets: 129 10*3/uL — ABNORMAL LOW (ref 150–400)
RBC: 2.59 MIL/uL — ABNORMAL LOW (ref 3.87–5.11)
RDW: 17.3 % — ABNORMAL HIGH (ref 11.5–15.5)
WBC: 6.9 10*3/uL (ref 4.0–10.5)
nRBC: 0 % (ref 0.0–0.2)

## 2020-08-01 LAB — CBC
HCT: 23 % — ABNORMAL LOW (ref 36.0–46.0)
Hemoglobin: 7.1 g/dL — ABNORMAL LOW (ref 12.0–15.0)
MCH: 29.5 pg (ref 26.0–34.0)
MCHC: 30.9 g/dL (ref 30.0–36.0)
MCV: 95.4 fL (ref 80.0–100.0)
Platelets: 129 10*3/uL — ABNORMAL LOW (ref 150–400)
RBC: 2.41 MIL/uL — ABNORMAL LOW (ref 3.87–5.11)
RDW: 18.2 % — ABNORMAL HIGH (ref 11.5–15.5)
WBC: 6.8 10*3/uL (ref 4.0–10.5)
nRBC: 0 % (ref 0.0–0.2)

## 2020-08-01 LAB — IRON AND TIBC
Iron: 225 ug/dL — ABNORMAL HIGH (ref 28–170)
Saturation Ratios: 74 % — ABNORMAL HIGH (ref 10.4–31.8)
TIBC: 304 ug/dL (ref 250–450)
UIBC: 79 ug/dL

## 2020-08-01 LAB — COMPREHENSIVE METABOLIC PANEL
ALT: 15 U/L (ref 0–44)
AST: 15 U/L (ref 15–41)
Albumin: 3.1 g/dL — ABNORMAL LOW (ref 3.5–5.0)
Alkaline Phosphatase: 361 U/L — ABNORMAL HIGH (ref 38–126)
Anion gap: 17 — ABNORMAL HIGH (ref 5–15)
BUN: 103 mg/dL — ABNORMAL HIGH (ref 6–20)
CO2: 23 mmol/L (ref 22–32)
Calcium: 6 mg/dL — CL (ref 8.9–10.3)
Chloride: 104 mmol/L (ref 98–111)
Creatinine, Ser: 3.81 mg/dL — ABNORMAL HIGH (ref 0.44–1.00)
GFR, Estimated: 13 mL/min — ABNORMAL LOW (ref >60)
Glucose, Bld: 96 mg/dL (ref 70–99)
Potassium: 2.9 mmol/L — ABNORMAL LOW (ref 3.5–5.1)
Sodium: 144 mmol/L (ref 135–145)
Total Bilirubin: 1.4 mg/dL — ABNORMAL HIGH (ref 0.3–1.2)
Total Protein: 5.3 g/dL — ABNORMAL LOW (ref 6.5–8.1)

## 2020-08-01 LAB — PHOSPHORUS: Phosphorus: 6.5 mg/dL — ABNORMAL HIGH (ref 2.5–4.6)

## 2020-08-01 LAB — MAGNESIUM: Magnesium: 1.5 mg/dL — ABNORMAL LOW (ref 1.7–2.4)

## 2020-08-01 LAB — PREPARE RBC (CROSSMATCH)

## 2020-08-01 LAB — SARS CORONAVIRUS 2 (TAT 6-24 HRS): SARS Coronavirus 2: NEGATIVE

## 2020-08-01 LAB — POC SARS CORONAVIRUS 2 AG -  ED: SARS Coronavirus 2 Ag: NEGATIVE

## 2020-08-01 LAB — FERRITIN: Ferritin: 255 ng/mL (ref 11–307)

## 2020-08-01 MED ORDER — POTASSIUM CHLORIDE CRYS ER 20 MEQ PO TBCR
40.0000 meq | EXTENDED_RELEASE_TABLET | Freq: Two times a day (BID) | ORAL | Status: AC
  Administered 2020-08-01 (×2): 40 meq via ORAL
  Filled 2020-08-01 (×2): qty 2

## 2020-08-01 MED ORDER — SODIUM CHLORIDE 0.9% FLUSH
3.0000 mL | Freq: Two times a day (BID) | INTRAVENOUS | Status: DC
  Administered 2020-08-01 – 2020-08-05 (×10): 3 mL via INTRAVENOUS

## 2020-08-01 MED ORDER — TACROLIMUS 1 MG PO CAPS
5.0000 mg | ORAL_CAPSULE | Freq: Two times a day (BID) | ORAL | Status: DC
  Administered 2020-08-01 – 2020-08-06 (×11): 5 mg via ORAL
  Filled 2020-08-01 (×13): qty 5

## 2020-08-01 MED ORDER — PREDNISONE 5 MG PO TABS
5.0000 mg | ORAL_TABLET | Freq: Every day | ORAL | Status: DC
  Administered 2020-08-01 – 2020-08-06 (×6): 5 mg via ORAL
  Filled 2020-08-01 (×7): qty 1

## 2020-08-01 MED ORDER — ACETAMINOPHEN 325 MG PO TABS: 650.0000 mg | ORAL_TABLET | Freq: Four times a day (QID) | ORAL | Status: DC | PRN

## 2020-08-01 MED ORDER — METOPROLOL SUCCINATE ER 25 MG PO TB24
25.0000 mg | ORAL_TABLET | Freq: Every day | ORAL | Status: DC
  Administered 2020-08-01 – 2020-08-06 (×6): 25 mg via ORAL
  Filled 2020-08-01 (×6): qty 1

## 2020-08-01 MED ORDER — CALCITRIOL 0.5 MCG PO CAPS
0.5000 ug | ORAL_CAPSULE | Freq: Two times a day (BID) | ORAL | Status: DC
  Administered 2020-08-01 – 2020-08-06 (×10): 0.5 ug via ORAL
  Filled 2020-08-01 (×11): qty 1

## 2020-08-01 MED ORDER — TORSEMIDE 20 MG PO TABS
80.0000 mg | ORAL_TABLET | Freq: Every day | ORAL | Status: DC
  Administered 2020-08-01 – 2020-08-03 (×3): 80 mg via ORAL
  Filled 2020-08-01 (×3): qty 4

## 2020-08-01 MED ORDER — SODIUM CHLORIDE 0.9 % IV SOLN
10.0000 mL/h | Freq: Once | INTRAVENOUS | Status: AC
  Administered 2020-08-01: 10 mL/h via INTRAVENOUS

## 2020-08-01 MED ORDER — ACETAMINOPHEN 650 MG RE SUPP: 650.0000 mg | Freq: Four times a day (QID) | RECTAL | Status: DC | PRN

## 2020-08-01 MED ORDER — DARBEPOETIN ALFA 100 MCG/0.5ML IJ SOSY: 100.0000 ug | PREFILLED_SYRINGE | Freq: Once | INTRAMUSCULAR | Status: DC

## 2020-08-01 MED ORDER — AMIODARONE HCL 200 MG PO TABS
200.0000 mg | ORAL_TABLET | Freq: Every day | ORAL | Status: DC
  Administered 2020-08-01 – 2020-08-06 (×6): 200 mg via ORAL
  Filled 2020-08-01 (×6): qty 1

## 2020-08-01 MED ORDER — MAGNESIUM SULFATE 2 GM/50ML IV SOLN
2.0000 g | Freq: Once | INTRAVENOUS | Status: AC
  Administered 2020-08-01: 2 g via INTRAVENOUS
  Filled 2020-08-01: qty 50

## 2020-08-01 MED ORDER — PANTOPRAZOLE SODIUM 40 MG PO TBEC
40.0000 mg | DELAYED_RELEASE_TABLET | Freq: Every day | ORAL | Status: DC
  Administered 2020-08-01 – 2020-08-06 (×6): 40 mg via ORAL
  Filled 2020-08-01 (×6): qty 1

## 2020-08-01 MED ORDER — VITAMIN B-12 1000 MCG PO TABS
1000.0000 ug | ORAL_TABLET | Freq: Every day | ORAL | Status: DC
  Administered 2020-08-01 – 2020-08-06 (×6): 1000 ug via ORAL
  Filled 2020-08-01 (×6): qty 1

## 2020-08-01 MED ORDER — CALCITRIOL 0.5 MCG PO CAPS
0.5000 ug | ORAL_CAPSULE | Freq: Every day | ORAL | Status: DC
  Administered 2020-08-01: 0.5 ug via ORAL
  Filled 2020-08-01: qty 1

## 2020-08-01 MED ORDER — ISOSORB DINITRATE-HYDRALAZINE 20-37.5 MG PO TABS: 2.0000 | ORAL_TABLET | Freq: Three times a day (TID) | ORAL | Status: DC

## 2020-08-01 MED ORDER — CALCIUM GLUCONATE-NACL 1-0.675 GM/50ML-% IV SOLN
1.0000 g | Freq: Once | INTRAVENOUS | Status: AC
  Administered 2020-08-01: 1000 mg via INTRAVENOUS
  Filled 2020-08-01: qty 50

## 2020-08-01 MED ORDER — MYCOPHENOLATE SODIUM 180 MG PO TBEC
540.0000 mg | DELAYED_RELEASE_TABLET | Freq: Two times a day (BID) | ORAL | Status: DC
  Administered 2020-08-01 – 2020-08-06 (×11): 540 mg via ORAL
  Filled 2020-08-01 (×12): qty 3

## 2020-08-01 MED ORDER — FERROUS SULFATE 325 (65 FE) MG PO TABS
325.0000 mg | ORAL_TABLET | Freq: Two times a day (BID) | ORAL | Status: DC
  Administered 2020-08-01 – 2020-08-02 (×3): 325 mg via ORAL
  Filled 2020-08-01 (×3): qty 1

## 2020-08-01 MED ORDER — DULOXETINE HCL 20 MG PO CPEP
20.0000 mg | ORAL_CAPSULE | Freq: Every day | ORAL | Status: DC
Start: 2020-08-02 — End: 2020-08-06
  Administered 2020-08-02 – 2020-08-06 (×5): 20 mg via ORAL
  Filled 2020-08-01 (×5): qty 1

## 2020-08-01 MED ORDER — ATORVASTATIN CALCIUM 40 MG PO TABS
40.0000 mg | ORAL_TABLET | Freq: Every day | ORAL | Status: DC
  Administered 2020-08-01 – 2020-08-05 (×5): 40 mg via ORAL
  Filled 2020-08-01 (×5): qty 1

## 2020-08-01 MED ORDER — DULOXETINE HCL 30 MG PO CPEP
30.0000 mg | ORAL_CAPSULE | Freq: Every day | ORAL | Status: DC
  Administered 2020-08-01: 30 mg via ORAL
  Filled 2020-08-01: qty 1

## 2020-08-01 MED ORDER — APIXABAN 5 MG PO TABS
5.0000 mg | ORAL_TABLET | Freq: Two times a day (BID) | ORAL | Status: DC
  Administered 2020-08-01 – 2020-08-02 (×3): 5 mg via ORAL
  Filled 2020-08-01 (×3): qty 1

## 2020-08-01 MED ORDER — CALCIUM CARBONATE-VITAMIN D 500-200 MG-UNIT PO TABS
1.0000 | ORAL_TABLET | Freq: Two times a day (BID) | ORAL | Status: DC
  Administered 2020-08-01 – 2020-08-03 (×4): 1 via ORAL
  Filled 2020-08-01 (×6): qty 1

## 2020-08-01 MED ORDER — POTASSIUM CHLORIDE CRYS ER 20 MEQ PO TBCR
40.0000 meq | EXTENDED_RELEASE_TABLET | Freq: Once | ORAL | Status: AC
  Administered 2020-08-01: 40 meq via ORAL
  Filled 2020-08-01: qty 2

## 2020-08-01 MED ORDER — GUAIFENESIN-DM 100-10 MG/5ML PO SYRP
5.0000 mL | ORAL_SOLUTION | ORAL | Status: DC | PRN
  Administered 2020-08-04: 5 mL via ORAL
  Filled 2020-08-01 (×2): qty 5

## 2020-08-01 MED ORDER — ISOSORB DINITRATE-HYDRALAZINE 20-37.5 MG PO TABS
1.5000 | ORAL_TABLET | Freq: Three times a day (TID) | ORAL | Status: DC
  Administered 2020-08-01 – 2020-08-06 (×17): 1.5 via ORAL
  Filled 2020-08-01: qty 2
  Filled 2020-08-01: qty 1.5
  Filled 2020-08-01 (×4): qty 2
  Filled 2020-08-01: qty 1.5
  Filled 2020-08-01 (×4): qty 2
  Filled 2020-08-01: qty 1.5
  Filled 2020-08-01 (×4): qty 2
  Filled 2020-08-01: qty 1.5
  Filled 2020-08-01 (×2): qty 2

## 2020-08-01 MED ORDER — SODIUM CHLORIDE 0.9% IV SOLUTION: Freq: Once | INTRAVENOUS | Status: AC

## 2020-08-01 MED ORDER — POLYETHYLENE GLYCOL 3350 17 G PO PACK: 17.0000 g | PACK | Freq: Every day | ORAL | Status: DC | PRN

## 2020-08-01 NOTE — H&P (Signed)
Date: 08/01/2020               Patient Name:  [name] MRN: [id number]  DOB: 1966-06-04 Age / Sex: 55 y.o., female   PCP: [name], DO         Medical Service: Internal Medicine Teaching Service         Attending Physician: Dr. Jimmye Norman, Elaina Pattee, MD    First Contact: Dr. Wynetta Emery Pager: [phone]  Second Contact: Dr. 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 Patient was at [facility] and found have to have Hgb 5.4. Appears that her baseline Hgb is around 7-8 from her anemia of chronic disease.   [name] states she has only been feeling more tired lately with lightheadedness with movement. She had 1 episode of dizziness "the other night" but it resolved quickly, so she did not think much of it. She endorses generalized weakness but notes that is chronic and why she is at a SNF. She denies any hematemesis, melena, hematochezia, hematuria, vaginal bleeding. She states her stool is always dark since starting iron supplementation, but no recent change in color. She endorses prolonged bleeding when cut but only since starting Eliquis.   She denies any other acute complaints at this time, including fever, chills, HA, blurry vision, N/V, sore throat, chest pain, palpitations, abdominal pain, constipation, diarrhea, urinary retention, dysuria.   As per patient, she has a history of anemia requiring blood transfusions, with her last one being during a recent hospitalization earlier this  month. She reports that her father had a bleeding problem but etiology was never discovered.  Does endorse persistent cough, which is unchanged from prior hospitalization. She is on chronic oxygen at 3L via [place] as needed.  History of renal failure s/p cadaver renal transplant in 2011. 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 She is currently on prograf, myfortic, and chronic steroids.   Meds:  No current facility-administered medications on file prior to encounter.   Current Outpatient Medications on File Prior to Encounter  Medication Sig Dispense Refill  . acetaminophen (TYLENOL) 325 MG tablet Take 2 tablets (650 mg total) by mouth every 6 (six) hours as needed for mild pain (or Fever >/= 101). 50 tablet 1  . albuterol (PROVENTIL) (2.5 MG/3ML) 0.083% nebulizer solution Take 3 mLs (2.5 mg total) by nebulization every 6 (six) hours as needed for wheezing or shortness of breath. 75 mL 12  . amiodarone (PACERONE) 200 MG tablet Take 1 tablet (200 mg total) by mouth daily. 30 tablet 0  . apixaban (ELIQUIS) 5 MG TABS tablet Take 1 tablet (5 mg total) by mouth 2 (two) times daily. 60 tablet 4  . atorvastatin (LIPITOR) 40 MG tablet Take 1 tablet (40 mg total) by mouth daily. (Patient taking differently: Take 40 mg by mouth at bedtime.) 90 tablet 3  . calcitRIOL (ROCALTROL) 0.5 MCG capsule Take 1  capsule (0.5 mcg total) by mouth daily. 60 capsule 3  . calcium-vitamin D (OSCAL WITH D) 500-200 MG-UNIT tablet Take 1 tablet by mouth 2 (two) times daily. 60 tablet 3  . Darbepoetin Alfa (ARANESP) 60 MCG/0.3ML SOSY injection Inject 0.3 mLs (60 mcg total) into the skin every Saturday at 6 PM. 4.2 mL   . DULoxetine (CYMBALTA) 30 MG capsule Take 1 capsule (30 mg total) by mouth daily. 60 capsule 3  . ferrous sulfate 325 (65 FE) MG tablet Take 1 tablet (325 mg total) by mouth 2 (two) times daily with a meal. 60 tablet 3   . guaiFENesin-dextromethorphan (ROBITUSSIN DM) 100-10 MG/5ML syrup Take 5 mLs by mouth every 4 (four) hours as needed for cough. 118 mL 0  . insulin aspart (NOVOLOG) 100 UNIT/ML injection Inject 0-20 Units into the skin 3 (three) times daily with meals. 10 mL 11  . isosorbide-hydrALAZINE (BIDIL) 20-37.5 MG tablet Take 2 tablets by mouth 3 (three) times daily.    [name] loperamide (IMODIUM) 2 MG capsule Take 2 capsules (4 mg total) by mouth daily as needed for diarrhea or loose stools. 30 capsule 0  . loratadine (CLARITIN) 10 MG tablet Take 1 tablet (10 mg total) by mouth daily. (Patient taking differently: Take 10 mg by mouth daily as needed for allergies.) 90 tablet 3  . metoprolol succinate (TOPROL-XL) 25 MG 24 hr tablet Take 1 tablet (25 mg total) by mouth daily. 30 tablet 3  . mycophenolate (MYFORTIC) 180 MG EC tablet TAKE 3 TABLETS BY MOUTH TWICE DAILY (Patient taking differently: Take 540 mg by mouth 2 (two) times daily.) 540 tablet 0  . omeprazole (PRILOSEC) 40 MG capsule Take 40 mg by mouth daily.    . predniSONE (DELTASONE) 5 MG tablet Take 1 tablet (5 mg total) by mouth daily with breakfast.    . tacrolimus (PROGRAF) 5 MG capsule Take 1 capsule (5 mg total) by mouth 2 (two) times daily.    [name] torsemide (DEMADEX) 20 MG tablet Take 4 tablets (80 mg total) by mouth daily.    [name] triamcinolone ointment (KENALOG) 0.1 % Apply 1 application topically 2 (two) times daily as needed (rash).    . vitamin B-12 (CYANOCOBALAMIN) 1000 MCG tablet Take 1 tablet (1,000 mcg total) by mouth daily. 30 tablet 3   Allergies: Allergies as of 07/31/2020 - Review Complete 07/31/2020  Allergen Reaction Noted  . Infed [iron dextran] Other (See Comments) 12/09/2011  . Cefazolin Itching and Nausea And Vomiting 11/30/2015  . Ancef [cefazolin sodium] Other (See Comments) 11/03/2011  . Lisinopril Swelling 11/30/2015  . Other  11/03/2011   Past Medical History:  Diagnosis Date  . Acute heart failure ([place]) 06/2020  .  Blood transfusion without reported diagnosis   . CHF (congestive heart failure) ([place])   . 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 GERD (gastroesophageal reflux disease)    medication induced  . Hypertension   . Renal failure    hx of  . 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 Hypertension Mother   . Diabetes Mother   . Atrial fibrillation Father   . Hypertension  Father   . Arthritis Father   . Heart failure Sister   . Hypertension Sister        all 4 sisters  . Hypertension Brother        both brothers  . Diabetes Brother   . Colon cancer Neg Hx   . Stomach cancer Neg Hx    Social History: Endorses previous tobacco use, 1 pack per day for 30 years. Denies any alcohol or drug use.  Currently resides at rehab center, however previously lived with daughter.   Review of Systems: A complete ROS was negative except as per HPI.   Physical Exam: Blood pressure (!) 155/64, pulse (!) 56, temperature 98.2 F (36.8 C), temperature source Oral, resp. rate 16, height $RemoveBe'5\' 11"'vaYxGNhta$  (1.803 m), weight 106.1 kg, last menstrual period 11/05/2012, SpO2 100 %.  Physical Exam Constitutional:      Appearance: She is obese. She is not ill-appearing.     Comments: Pleasant female, lying in bed, NAD  HENT:     Head: Normocephalic and atraumatic.  Eyes:     Extraocular Movements: Extraocular movements intact.     Pupils: Pupils are equal, round, and reactive to light.     Comments: Conjunctival pallor  Cardiovascular:     Rate and Rhythm: Normal rate and regular rhythm.     Pulses: Normal pulses.     Heart sounds: Normal heart sounds. No murmur  heard. No friction rub. No gallop.   Pulmonary:     Effort: Pulmonary effort is normal.     Breath sounds: Normal breath sounds. No wheezing, rhonchi or rales.  Abdominal:     General: Bowel sounds are normal. There is no distension.     Palpations: Abdomen is soft.     Tenderness: There is no abdominal tenderness.  Musculoskeletal:     Comments: 1-2+ pitting edema bilaterally.  Skin:    General: Skin is warm and dry.  Neurological:     General: No focal deficit present.     Mental Status: She is alert and oriented to person, place, and time.     Motor: Weakness present.  Psychiatric:        Mood and Affect: Mood normal.        Behavior: Behavior normal.        Thought Content: Thought content normal.     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  Anemia of chronic disease Patient presenting with severe anemia with Hgb 5.3. Baseline Hgb around 7-8 for the past year. During prior admission earlier this month, noted to have Hgb down to 6.8 requiring pRBCs with appropriate response. During that admission, workup performed but no IDA noted and no bleeding source identified. Started on weekly Aranesp by nephrology on 07/15/20.  -received 1 unit pRBC, may need another unit -trend CBC -transfuse for Hgb <7 and/or active bleeding -continue B12 and ferrous sulfate  Renal failure s/p cadaver renal transplant in 2011 On immunosuppression with prograf, myfortic, and chronic prednisone CKD Stage V Patient's creatinine has been fluctuating, but progressively worsening over the past few years so unable to establish baseline. Her GFR  is  now 83, so patient is now considered to have progressed to CKD Stage V. CMP also showing BUN 100, which is increased from priors. Followed by [facility]. On prograf 5mg  BID, myfortic 540mg  BID, and prednisone 5mg  daily for immunosuppression. -continue home myfortic, prograf, and prednisone -consider nephro consult given progression to CKD Stage V and renal transplant history -monitor UOP -avoid nephrotoxic medications  Secondary hyperparathyroidism from CKD Hypocalcemia Hypokalemia Calcium of 5.9 on admission. Corrected calcium ~6.7. Secondary hyperparathyroidism also thought to be the cause of her elevated alk phos (had negative multiple myeloma workup in the past). -ordered IV calcium gluconate 1g -continue calcitriol, calcium-vitamin D -trend BMP  Hypokalemia K 3.1. Chronically low. -ordered PO K 18mEq once -trend BMP  Chronic biventricular heart failure Atrial fibrillation (on eliquis) HTN HLD On 2L O2 at baseline Recent hospital admission for HF exacerbation s/p extensive diuresis (~60 lbs). ECHO during prior admission showing EF 25-30% with mild LV dilation, severe LVH, moderately decreased RV function/mild RV enlargement, moderate aortic insuffficiency. Patient does have 1-2+ pitting edema in bilateral lower extremities, but will need to be cautious with diuresis given CKD stage V. Most recent dry weight appears to be around 273 lbs.   -continue torsemide 80mg  daily for HF -continue bidil 1.5 tabs TID, toprol XL 25mg  daily for HTN -continue amiodarone 200mg  daily and eliquis 5mg  BID for A-fib -continue lipitor 40mg  daily for HLD -strict I/Os, daily weights, fluid restriction -avoid ACEI, ARB, spironolactone, digoxin given CKD stage V  Hx of DVT Antithrombin III deficiency RUE doppler during prior admission showing age-indeterminate DVT of right axillary vein and age-indeterminate superficial vein thrombosis of right cephalic vein, unchanged from priors. ATIII  levels found to be 57% of normal in 03/2020, thought to be due to nephrotic syndrome. -continue eliquis 5mg  BID -SCDs  GERD -continue home protonix 40mg  daily  Depression -continue home cymbalta 30mg  daily  Dispo: Admit patient to Observation with expected length of stay less than 2 midnights.  Signed: Virl Axe, MD 08/01/2020, 4:00 AM  Pager: [phone] After 5pm on weekdays and 1pm on weekends: On Call pager: [phone]

## 2020-08-01 NOTE — ED Notes (Signed)
1st unit PRBC infusing with no adverse effect , no fever /VSS , IV site intact , respirations unlabored /denies pain .

## 2020-08-01 NOTE — ED Notes (Signed)
Patient signed consent form for Blood Transfusion. 

## 2020-08-01 NOTE — Progress Notes (Signed)
Patient arrived to unit in NAD, VS stable and patient free from pain. Patient oriented to room and call bell in reach.

## 2020-08-01 NOTE — Hospital Course (Addendum)
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 Baseline Hgb around 7-8 for the past year. Patient presented with severe anemia with Hgb 5.3, endorsing weakness and mild fatigue prior to admission. Initially received 2 units of RBCs, with appropriate rise in Hgb. Hgb subsequently dropped to 6.5, requiring 2 additional units. Patient remained clinically stable throughout, and hgb remains          Endoscopy and colonoscopy unremarkable.

## 2020-08-01 NOTE — ED Notes (Signed)
1st unit PRBC satrted at 120 ml /hr, VSS/afebrile , denies pain , respirations unlabored , IV site intact.

## 2020-08-01 NOTE — ED Notes (Signed)
2nd unit PRBC infusing with no adverse effect , afebrile/VSS , denies pain , respirations unlabored , IV site unremarkable .

## 2020-08-01 NOTE — ED Provider Notes (Signed)
[facility]ERGENCY DEPARTMENT Provider Note   CSN: [id number] Arrival date & time: 07/31/20  1616     History Chief Complaint  Patient presents with  . Abnormal Lab    [name] is a 55 y.o. female.  55 y/o female with hx of HTN, CHF, DM, CKD stage IV, chronic normocytic anemia, GERD presents to the ED for low hemoglobin. This was checked by [facility] and found to be 5.4; baseline is ~7-8. Patient hx of anemia requiring transfusions, most recently during hospitalization this month. [name] has dark stools due to taking daily iron tablets. No change to this recently. No hematochezia. Denies hematuria, vaginal bleeding, hematemesis, hemoptysis, increased bleeding/bruising, fever, syncope, increased fatigue. C/o persistent congested cough with "phlegm" in [name] chest; largely unchanged since discharge 2 weeks ago. States [name] chronically uses 3L O2 via [place] PRN.       Past Medical History:  Diagnosis Date  . Acute heart failure ([place]) 06/2020  . Blood transfusion without reported diagnosis   . CHF (congestive heart failure) ([place])   . 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 GERD (gastroesophageal reflux disease)    medication induced  . Hypertension   . Renal failure    hx of  . 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 NSVT (nonsustained ventricular tachycardia) ([place])   . Chronic kidney disease (CKD), stage IV (severe) ([place]) 07/05/2020  . Acute on chronic heart failure ([place]) 07/04/2020  . 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 Elevated alkaline phosphatase level 04/04/2020  . DVT of right axillary vein,  acute ([place]) 04/03/2020  . Iliopsoas bursitis of right hip 04/03/2020  . PAF (paroxysmal atrial fibrillation) ([place]) 03/22/2020  . Chronic kidney disease, stage 3b ([place]) 03/22/2020  . Anemia of chronic disease 03/20/2020  . Heart failure with reduced ejection fraction ([place]) 03/19/2020  . Multiple pulmonary nodules 11/14/2019  . Vitamin B 12 deficiency 10/11/2019  . Myalgia 10/11/2019  . Leg weakness, bilateral 08/19/2019  . GERD (gastroesophageal reflux disease) 01/27/2019  . Secondary hyperparathyroidism of renal origin ([place]) 06/11/2018  . Encounter for monitoring tacrolimus therapy 05/22/2018  . Hyperlipidemia 02/12/2017  . Preventative health care 08/12/2012  . Tobacco abuse 04/30/2012  . S/p cadaver renal transplant 11/19/2011  . Type II diabetes mellitus, well controlled ([place]) 11/04/2011  . Acute kidney injury superimposed on chronic kidney disease ([place]) 11/03/2011  . HTN (hypertension) 11/03/2011    Past Surgical History:  Procedure Laterality Date  . [place]  . CHOLECYSTECTOMY  2008  . COLONOSCOPY  10 years ago   Dr.Hung="normal"  . DG AV DIALYSIS  SHUNT ACCESS EXIST*L* OR    . KIDNEY TRANSPLANT  2011  . UMBILICAL HERNIA REPAIR  2009     OB History   No obstetric history on file.     Family History  Problem Relation Age of Onset  . Hypertension Mother   . Diabetes Mother   . Atrial fibrillation Father   . Hypertension Father   . Arthritis Father   . Heart failure Sister   . Hypertension Sister  all 4 sisters  . Hypertension Brother        both brothers  . Diabetes Brother   . Colon cancer Neg Hx   . Stomach cancer Neg Hx     Social History   Tobacco Use  . Smoking status: Current Some Day Smoker    Packs/day: 0.50    Types: Cigarettes  . Smokeless tobacco: Never Used  . Tobacco comment: 10 some days   Vaping Use  . Vaping Use: Never used  Substance Use Topics  . Alcohol use: Never    Alcohol/week: 0.0 standard drinks   . Drug use: Never    Home Medications Prior to Admission medications   Medication Sig Start Date End Date Taking? Authorizing Provider  acetaminophen (TYLENOL) 325 MG tablet Take 2 tablets (650 mg total) by mouth every 6 (six) hours as needed for mild pain (or Fever >/= 101). 03/24/20  Yes Marianna Payment, MD  albuterol (PROVENTIL) (2.5 MG/3ML) 0.083% nebulizer solution Take 3 mLs (2.5 mg total) by nebulization every 6 (six) hours as needed for wheezing or shortness of breath. 03/24/20  Yes Marianna Payment, MD  amiodarone (PACERONE) 200 MG tablet Take 1 tablet (200 mg total) by mouth daily. 07/21/20 08/20/20 Yes Rehman, Areeg N, DO  apixaban (ELIQUIS) 5 MG TABS tablet Take 1 tablet (5 mg total) by mouth 2 (two) times daily. 04/26/20 10/01/20 Yes Angelica Pou, MD  atorvastatin (LIPITOR) 40 MG tablet Take 1 tablet (40 mg total) by mouth daily. Patient taking differently: Take 40 mg by mouth at bedtime. 03/25/20  Yes Marianna Payment, MD  calcitRIOL (ROCALTROL) 0.5 MCG capsule Take 1 capsule (0.5 mcg total) by mouth daily. 03/25/20  Yes Marianna Payment, MD  calcium-vitamin D (OSCAL WITH D) 500-200 MG-UNIT tablet Take 1 tablet by mouth 2 (two) times daily. 03/24/20  Yes Marianna Payment, MD  Darbepoetin Alfa (ARANESP) 60 MCG/0.3ML SOSY injection Inject 0.3 mLs (60 mcg total) into the skin every Saturday at 6 PM. 07/22/20  Yes Rehman, Areeg N, DO  DULoxetine (CYMBALTA) 30 MG capsule Take 1 capsule (30 mg total) by mouth daily. 03/25/20  Yes Marianna Payment, MD  ferrous sulfate 325 (65 FE) MG tablet Take 1 tablet (325 mg total) by mouth 2 (two) times daily with a meal. 03/24/20  Yes Marianna Payment, MD  guaiFENesin-dextromethorphan (ROBITUSSIN DM) 100-10 MG/5ML syrup Take 5 mLs by mouth every 4 (four) hours as needed for cough. 03/24/20  Yes Marianna Payment, MD  insulin aspart (NOVOLOG) 100 UNIT/ML injection Inject 0-20 Units into the skin 3 (three) times daily with meals. 03/24/20  Yes Marianna Payment, MD   isosorbide-hydrALAZINE (BIDIL) 20-37.5 MG tablet Take 2 tablets by mouth 3 (three) times daily. 07/20/20  Yes Rehman, Areeg N, DO  loperamide (IMODIUM) 2 MG capsule Take 2 capsules (4 mg total) by mouth daily as needed for diarrhea or loose stools. 07/20/20  Yes Rehman, Areeg N, DO  loratadine (CLARITIN) 10 MG tablet Take 1 tablet (10 mg total) by mouth daily. Patient taking differently: Take 10 mg by mouth daily as needed for allergies. 03/25/20  Yes Marianna Payment, MD  metoprolol succinate (TOPROL-XL) 25 MG 24 hr tablet Take 1 tablet (25 mg total) by mouth daily. 03/25/20  Yes Marianna Payment, MD  mycophenolate (MYFORTIC) 180 MG EC tablet TAKE 3 TABLETS BY MOUTH TWICE DAILY Patient taking differently: Take 540 mg by mouth 2 (two) times daily. 03/31/20  Yes Maudie Mercury, MD  omeprazole (PRILOSEC) 40 MG capsule Take 40 mg by  mouth daily. 05/22/20  Yes [provider]  predniSONE (DELTASONE) 5 MG tablet Take 1 tablet (5 mg total) by mouth daily with breakfast. 07/21/20  Yes Rehman, Areeg N, DO  tacrolimus (PROGRAF) 5 MG capsule Take 1 capsule (5 mg total) by mouth 2 (two) times daily. 07/20/20  Yes Rehman, Areeg N, DO  torsemide (DEMADEX) 20 MG tablet Take 4 tablets (80 mg total) by mouth daily. 07/21/20  Yes Rehman, Areeg N, DO  triamcinolone ointment (KENALOG) 0.1 % Apply 1 application topically 2 (two) times daily as needed (rash). 02/24/20  Yes [provider]  vitamin B-12 (CYANOCOBALAMIN) 1000 MCG tablet Take 1 tablet (1,000 mcg total) by mouth daily. 10/11/19  Yes Ladona Horns, MD    Allergies    Ardyth Harps Antonieta Pert dextran], Cefazolin, Ancef [cefazolin sodium], Lisinopril, and Other  Review of Systems   Review of Systems Ten systems reviewed and are negative for acute change, except as noted in the HPI.    Physical Exam Updated Vital Signs BP (!) 147/77 (BP Location: Right Arm)   Pulse 61   Temp 97.6 F (36.4 C) (Oral)   Resp 17   Ht 5\' 11"  (1.803 m)   Wt 106.1 kg   LMP  11/05/2012   SpO2 98%   BMI 32.64 kg/m   Physical Exam Vitals and nursing note reviewed.  Constitutional:      General: [name] is not in acute distress.    Appearance: [name] is well-developed and well-nourished. [name] is not diaphoretic.     Comments: Obese, nontoxic AA female  HENT:     Head: Normocephalic and atraumatic.  Eyes:     General: No scleral icterus.    Extraocular Movements: EOM normal.     Conjunctiva/sclera: Conjunctivae normal.  Cardiovascular:     Rate and Rhythm: Normal rate and regular rhythm.     Pulses: Normal pulses.  Pulmonary:     Effort: Pulmonary effort is normal. No respiratory distress.     Breath sounds: No wheezing.     Comments: Decreased BS b/l bases. Congested, nonproductive cough. Respirations even and unlabored. Genitourinary:    Comments: Deferred  Musculoskeletal:        General: Normal range of motion.     Cervical back: Normal range of motion.  Skin:    General: Skin is warm and dry.     Coloration: Skin is not pale.     Findings: No erythema or rash.  Neurological:     Mental Status: [name] is alert and oriented to person, place, and time.     Coordination: Coordination normal.  Psychiatric:        Mood and Affect: Mood and affect normal.        Behavior: Behavior normal.     ED Results / Procedures / Treatments   Labs (all labs ordered are listed, but only abnormal results are displayed) Labs Reviewed  COMPREHENSIVE METABOLIC PANEL - Abnormal; Notable for the following components:      Result Value   Potassium 3.1 (*)    Glucose, Bld 163 (*)    BUN 100 (*)    Creatinine, Ser 3.80 (*)    Calcium 5.9 (*)    Total Protein 5.0 (*)    Albumin 2.9 (*)    Alkaline Phosphatase 356 (*)    GFR, Estimated 13 (*)    All other components within normal limits  CBC - Abnormal; Notable for the following components:   RBC 1.85 (*)    Hemoglobin 5.3 (*)  HCT 18.5 (*)    MCHC 28.6 (*)    RDW 17.3 (*)    Platelets 128 (*)    All other  components within normal limits  SARS CORONAVIRUS 2 (TAT 6-24 HRS)  TYPE AND SCREEN  PREPARE RBC (CROSSMATCH)    EKG None  Radiology No results found.  Procedures .Critical Care Performed by: Antonietta Breach, PA-C Authorized by: Antonietta Breach, PA-C   Critical care provider statement:    Critical care time (minutes):  45   Critical care was necessary to treat or prevent imminent or life-threatening deterioration of the following conditions: acute anemia requiring trasfusion.   Critical care was time spent personally by me on the following activities:  Discussions with consultants, evaluation of patient's response to treatment, examination of patient, ordering and performing treatments and interventions, ordering and review of laboratory studies, ordering and review of radiographic studies, pulse oximetry, re-evaluation of patient's condition, obtaining history from patient or surrogate and review of old charts     Medications Ordered in ED Medications  0.9 %  sodium chloride infusion (has no administration in time range)    ED Course  I have reviewed the triage vital signs and the nursing notes.  Pertinent labs & imaging results that were available during my care of the patient were reviewed by me and considered in my medical decision making (see chart for details).  Clinical Course as of 08/01/20 0304  Tue Aug 01, 2020  0037 Labs reviewed. Corrected calcium in the setting of hypoalbuminemia calculated to be 6.8 mg/dL. [KH]  6967 Chart reviewed. Patient's chronic normocytic anemia has been suspected to be 2/2 [name] hx of CKD. Recently with normal iron deficiency panel. Required PRBC transfusion during most recent admission; discharge 07/20/20 with Hgb of 7.2. Nephrology reported to have started weekly Aranesp 76mcg on 07/15/20. [KH]  0127 Case discussed with IM teaching who will assess in the ED for admission. [KH]    Clinical Course User Index [KH] Beverely Pace   MDM  Rules/Calculators/A&P                          55 year old female presenting for evaluation of acute on chronic anemia.  Hemoglobin 5.3 down from baseline of 7-8.  [name] has been ordered to receive 1 unit PRBCs.  Will admit to internal medicine teaching service for observation; may require additional transfusions or interventions for anemia management. Hemodynamically stable at this time.   Final Clinical Impression(s) / ED Diagnoses Final diagnoses:  Acute on chronic anemia  Hypocalcemia    Rx / DC Orders ED Discharge Orders    None       Antonietta Breach, PA-C 89/38/10 1751    Delora Fuel, MD 02/58/52 [phone]

## 2020-08-01 NOTE — Progress Notes (Addendum)
Subjective:  Patient reports doing well. Denies sob. She reports sporadically requires oxygen at SNF, notably with PT. Few symptoms associated with her chronic anemia prior to admission; though notably felt tired. She has not noticed any bleeding.   Objective:  Vital signs in last 24 hours: Vitals:   08/01/20 0930 08/01/20 1327 08/01/20 1341 08/01/20 1530  BP: 132/76 (!) 163/81  (!) 160/82  Pulse: (!) 114 60  (!) 58  Resp: $Remo'12 14  15  'eGopL$ Temp:   98.2 F (36.8 C)   TempSrc:   Oral   SpO2: 99% 96%  96%  Weight:      Height:       Weight change:   Intake/Output Summary (Last 24 hours) at 08/01/2020 1608 Last data filed at 08/01/2020 1445 Gross per 24 hour  Intake 680 ml  Output 950 ml  Net -270 ml   Physical Exam Constitutional:      General: She is not in acute distress.    Appearance: Normal appearance. She is obese. She is not ill-appearing, toxic-appearing or diaphoretic.  Eyes:     Comments: Mild conjunctival pallor  Pulmonary:     Effort: Pulmonary effort is normal. No respiratory distress.     Breath sounds: Normal breath sounds.  Musculoskeletal:     Right lower leg: 1+ Pitting Edema present.     Left lower leg: 1+ Pitting Edema present.  Neurological:     Mental Status: She is alert and oriented to person, place, and time.     Assessment/Plan:  Principal Problem:   Acute on chronic anemia Active Problems:   S/p cadaver renal transplant   Secondary hyperparathyroidism of renal origin ([place])   Heart failure with reduced ejection fraction (HCC)   PAF (paroxysmal atrial fibrillation) (H[facility])  [name] is a 55 yo female with hx of HTN, CHF, A-fib, T2DM, renal failure s/p cadaver renal transplant in 2011 (on prograf, myfortic, and chronic steroids) now with CKD Stage V, chronic normocytic anemia, antithrombin III deficiency complicated by DVT (on eliquis) admitted for anemia with Hgb 5.3. S/p 2 units of RBCs, with appropriate rise in Hgb.   Anemia of chronic  disease Patient presented with severe anemia with Hgb 5.3. Baseline Hgb around 7-8 for the past year. During prior admission earlier this month, noted to have Hgb down to 6.8 requiring pRBCs with appropriate response. During that admission, workup performed but no IDA noted and no bleeding source identified. Started on weekly Aranesp by nephrology on 07/15/20. Patient received 2 units of RBCs, with appropriate rise in Hgb to 7.4. She is stable and denies symptoms of anemia at present.   -f/u CBC this evening -trend CBC -consider GI consult if H/H drops again -transfuse for Hgb <7 and/or active bleeding -continue B12 and ferrous sulfate  Renal failure s/p cadaver renal transplant in 2011 On immunosuppression with prograf, myfortic, and chronic prednisone CKD Stage V Patient's creatinine has been fluctuating, but progressively worsening over the past few years so unable to establish baseline. Her eGFR is now 13, so patient is now considered to have progressed to CKD Stage V. CMP also showing BUN 100, which is increased from priors. Followed by [facility]. On prograf $RemoveBe'5mg'[id number]$  BID, myfortic $RemoveBefor'540mg'[id number]$  BID, and prednisone $RemoveBefore'5mg'[id number]$  daily for immunosuppression. BUN remains elevated at 103; phosphorus newly elevated to 6.5.  -Nephrology consulted, f/u recs -continue home myfortic, prograf, and prednisone -Tacrolimus levels pending -monitor UOP -avoid nephrotoxic medications   Secondary hyperparathyroidism from CKD Hypocalcemia Hypokalemia  Calcium of 5.9 on admission. Corrected calcium ~6.7. Secondary hyperparathyroidism also thought to be the cause of her elevated alk phos (had negative multiple myeloma workup in the past). Initially given IV calcium gluconate 1g. Phosphorus noted to be newly elevated at 6.5. Will defer further calcium supplementation at this time.  -F/u Nephrology recommendations on potentially starting binders -continue calcitriol, calcium-vitamin D -trend  BMP  Hypokalemia Hypomagnesemia  K 3.1 on admission. Chronically low. Low today.  -Administered K and Mg -trend BMP -Repleat as required  Chronic biventricular heart failure Atrial fibrillation (on eliquis) HTN HLD On 2L O2 at baseline Recent hospital admission for HF exacerbation s/p extensive diuresis (~60 lbs). ECHO during prior admission showing EF 25-30% with mild LV dilation, severe LVH, moderately decreased RV function/mild RV enlargement, moderate aortic insuffficiency. Patient does have 1-2+ pitting edema in bilateral lower extremities, but will need to be cautious with diuresis given CKD stage V. Most recent dry weight appears to be around 273 lbs.   -continue torsemide 80mg  daily for HF -continue bidil 1.5 tabs TID, toprol XL 25mg  daily for HTN -continue amiodarone 200mg  daily and eliquis 5mg  BID for A-fib -continue lipitor 40mg  daily for HLD -strict I/Os, daily weights, fluid restriction -avoid ACEI, ARB, spironolactone, digoxin given CKD stage V  Hx of DVT Antithrombin III deficiency RUE doppler during prior admission showing age-indeterminate DVT of right axillary vein and age-indeterminate superficial vein thrombosis of right cephalic vein, unchanged from priors. ATIII levels found to be 57% of normal in 03/2020, thought to be due to nephrotic syndrome. -continue eliquis 5mg  BID -SCDs  GERD -continue home protonix 40mg  daily   Depression -continue home cymbalta 30mg  daily   LOS: 0 days   Azell Der, Medical Student 08/01/2020, 4:08 PM

## 2020-08-01 NOTE — Consult Note (Signed)
[place]  Reason for Consultation: AKI on CKD s/p renal transplant Requesting Provider: Dr. Rebeca Alert  HPI: [name] is an 55 y.o. female with a PMH sig for HTN, HLD, DM, ESRD s/p renal transplant 2011, h/o DVT, and chronic systolic and diastolic CHF who is seen for evaluation and management of AKI on CKD s/p renal tranplant.   Presented to ED overnight with symptomatic anemia in the 5s on labwork from baseline Hb in th 7-8s typically.   She's been treated with pRBC and repeat Hb in the 7.4 this AM.  WBC normal, plt in the 120s, typically normal.    Creatinine 04/2020 was 1.7 but more recently in the high 3s and was 3.8 on presentation, BUN was in the 60s but is now 100.  Na 142, K 3.1, Bicarb 25, Ca 5.9, Albumin 2.9, ALP 356 (typically elevated), PHos 6.5. Ferritin 255, iron sat 74%.   Recently admitted 12/28 - 07/20/20 with hypoxia from volume overload, diuresed.  She was started on aranesp 1/8.  A fib with RVR started po amiodarone.  Gastroenteritis with sapovirus.  Tac dose increased due to low levels. She discharged to [facility].  She tells me that if she generally is feeling at her recent baseline.  If it weren't for the labs sending her to the ED she wouldn't really know differently.  She's been doing PT at [facility] and is able to stand some but isn't walking.  She has no other new complaints.  No LUTs, dysuria, pain over kidney transplant, no change to chronic cough.     PMH: Past Medical History:  Diagnosis Date  . Acute heart failure ([place]) 06/2020  . Blood transfusion without reported diagnosis   . CHF (congestive heart failure) ([place])   . 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 GERD (gastroesophageal reflux disease)    medication induced  . Hypertension   . Renal failure    hx of  . 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 [place]  . CHOLECYSTECTOMY  2008  . COLONOSCOPY  10 years ago   Dr.Hung="normal"  . DG AV DIALYSIS  SHUNT ACCESS EXIST*L* OR    . KIDNEY TRANSPLANT  2011  . UMBILICAL HERNIA REPAIR  2009    Past Medical History:  Diagnosis Date  . Acute heart failure ([place]) 06/2020  . Blood transfusion without reported diagnosis   . CHF (congestive heart failure) ([place])   . 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 GERD (gastroesophageal reflux disease)    medication induced  . Hypertension   . Renal failure    hx of  . 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  (Not in a hospital admission)   ALLERGIES:   Allergies  Allergen Reactions  . Infed [Iron Dextran] Other (See Comments)    Pt has chest pain  Chest pain  . Cefazolin Itching and Nausea And Vomiting  . Ancef [Cefazolin Sodium] Other (See Comments)    Tolerated rocephin 11/03/10 Reports ancef causes itching, sweating, vomiting (x2-2009?)  . Lisinopril Swelling  . Other     Pt may be allergic to another antibiotic daughter isn't sure and pt unable to verify.    FAM HX: Family History  Problem Relation Age of Onset  . Hypertension Mother   . Diabetes Mother   . Atrial fibrillation Father   . Hypertension Father   . Arthritis Father   . Heart failure Sister   . Hypertension Sister        all 4 sisters  . Hypertension Brother         both brothers  . Diabetes Brother   . Colon cancer Neg Hx   . Stomach cancer Neg Hx     Social History:   reports that she has been smoking cigarettes. She has been smoking about 0.50 packs per day. She has never used smokeless tobacco. She reports that she does not drink alcohol and does not use drugs.  ROS: 12 system ROS per HPI above  Blood pressure 132/76, pulse (!) 114, temperature 97.7 F (36.5 C), temperature source Oral, resp. rate 12, height 5\' 11"  (1.803 m), weight 106.1 kg, last menstrual period 11/05/2012, SpO2 99 %. PHYSICAL EXAM: Gen: chronically ill but nontoxic  Eyes: anicteric ENT: MMM Neck: supple CV:  RRR, III/VI SEM Abd: soft, nontender, RLQ kidney transplant nontender Lungs: a few scattered rhonchi, normal WOB GU: no foley Extr:  Trace ankle edema Neuro: nonfocal Skin: clotted av access noted LUE   Results for orders placed or performed during the hospital encounter of 07/31/20 (from the past 48 hour(s))  Type and screen [place]     Status: None (Preliminary result)   Collection Time: 07/31/20  4:30 PM  Result Value Ref Range   ABO/RH(D) A POS    Antibody Screen NEG    Sample Expiration 08/03/2020,2359    Unit Number [id number]    Blood Component Type RED CELLS,LR    Unit division 00    Status of Unit ISSUED    Transfusion Status OK TO TRANSFUSE    Crossmatch Result Compatible    Unit Number [id number]    Blood Component Type RED CELLS,LR    Unit division 00    Status of Unit ISSUED    Transfusion Status OK TO TRANSFUSE    Crossmatch Result      Compatible Performed at [facility], [address], [place], [place] [address]   Comprehensive metabolic panel     Status: Abnormal   Collection Time: 07/31/20  4:34 PM  Result Value Ref Range   Sodium 142 135 - 145 mmol/L   Potassium 3.1 (L) 3.5 - 5.1 mmol/L   Chloride 103 98 - 111 mmol/L   CO2 25 22 - 32 mmol/L   Glucose, Bld 163 (H) 70 - 99 mg/dL    Comment:  Glucose reference range applies only to samples taken after fasting for at least 8 hours.   BUN 100 (H) 6 - 20 mg/dL   Creatinine, Ser 3.80 (H) 0.44 - 1.00 mg/dL   Calcium 5.9 (LL) 8.9 - 10.3 mg/dL    Comment: CRITICAL RESULT CALLED TO, READ BACK BY AND VERIFIED WITH: K.PATE RN 1726 07/31/20 MCCORMICK K  Total Protein 5.0 (L) 6.5 - 8.1 g/dL   Albumin 2.9 (L) 3.5 - 5.0 g/dL   AST 15 15 - 41 U/L   ALT 15 0 - 44 U/L   Alkaline Phosphatase 356 (H) 38 - 126 U/L   Total Bilirubin 0.6 0.3 - 1.2 mg/dL   GFR, Estimated 13 (L) >60 mL/min    Comment: (NOTE) Calculated using the CKD-EPI Creatinine Equation (2021)    Anion gap 14 5 - 15    Comment: Performed at [facility] [address], [place] [address]  CBC     Status: Abnormal   Collection Time: 07/31/20  4:34 PM  Result Value Ref Range   WBC 5.8 4.0 - 10.5 K/uL   RBC 1.85 (L) 3.87 - 5.11 MIL/uL   Hemoglobin 5.3 (LL) 12.0 - 15.0 g/dL    Comment: REPEATED TO VERIFY THIS CRITICAL RESULT HAS VERIFIED AND BEEN CALLED TO M SCRUGGS RN BY KIRSTENE FORSYTH ON 01 24 2022 AT 1702, AND HAS BEEN READ BACK.     HCT 18.5 (L) 36.0 - 46.0 %   MCV 100.0 80.0 - 100.0 fL   MCH 28.6 26.0 - 34.0 pg   MCHC 28.6 (L) 30.0 - 36.0 g/dL   RDW 17.3 (H) 11.5 - 15.5 %   Platelets 128 (L) 150 - 400 K/uL   nRBC 0.0 0.0 - 0.2 %    Comment: Performed at [facility] [address] [place], [place] [address]  Prepare RBC (crossmatch)     Status: None   Collection Time: 08/01/20  1:13 AM  Result Value Ref Range   Order Confirmation      ORDER PROCESSED BY BLOOD BANK Performed at [facility], [place] [address], [place], [place] [address]   POC SARS Coronavirus 2 Ag-ED -     Status: None   Collection Time: 08/01/20  3:06 AM  Result Value Ref Range   SARS Coronavirus 2 Ag NEGATIVE NEGATIVE    Comment: (NOTE) SARS-CoV-2 antigen NOT DETECTED.   Negative results are presumptive.  Negative results do not preclude SARS-CoV-2 infection  and should not be used as the sole basis for treatment or other patient management decisions, including infection  control decisions, particularly in the presence of clinical signs and  symptoms consistent with COVID-19, or in those who have been in contact with the virus.  Negative results must be combined with clinical observations, patient history, and epidemiological information. The expected result is Negative.  Fact Sheet for Patients: HandmadeRecipes.com.cy  Fact Sheet for Healthcare Providers: FuneralLife.at  This test is not yet approved or cleared by the [place] FDA and  has been authorized for detection and/or diagnosis of SARS-CoV-2 by FDA under an Emergency Use Authorization (EUA).  This EUA will remain in effect (meaning this test can be used) for the duration of  the COV ID-19 declaration under Section 564(b)(1) of the Act, 21 U.S.C. section 360bbb-3(b)(1), unless the authorization is terminated or revoked sooner.    Prepare RBC (crossmatch)     Status: None   Collection Time: 08/01/20  4:00 AM  Result Value Ref Range   Order Confirmation      ORDER PROCESSED BY BLOOD BANK Performed at [facility], [place] [address], [place], [place] [address]   CBC with Differential     Status: Abnormal   Collection Time: 08/01/20  6:01 AM  Result Value Ref Range   WBC 6.9 4.0 - 10.5 K/uL  RBC 2.59 (L) 3.87 - 5.11 MIL/uL   Hemoglobin 7.4 (L) 12.0 - 15.0 g/dL    Comment: REPEATED TO VERIFY POST TRANSFUSION SPECIMEN    HCT 24.5 (L) 36.0 - 46.0 %   MCV 94.6 80.0 - 100.0 fL   MCH 28.6 26.0 - 34.0 pg   MCHC 30.2 30.0 - 36.0 g/dL   RDW 17.3 (H) 11.5 - 15.5 %   Platelets 129 (L) 150 - 400 K/uL   nRBC 0.0 0.0 - 0.2 %   Neutrophils Relative % 83 %   Neutro Abs 5.7 1.7 - 7.7 K/uL   Lymphocytes Relative 10 %   Lymphs Abs 0.7 0.7 - 4.0 K/uL   Monocytes Relative 5 %   Monocytes Absolute 0.3 0.1 - 1.0 K/uL   Eosinophils  Relative 2 %   Eosinophils Absolute 0.2 0.0 - 0.5 K/uL   Basophils Relative 0 %   Basophils Absolute 0.0 0.0 - 0.1 K/uL   Immature Granulocytes 0 %   Abs Immature Granulocytes 0.03 0.00 - 0.07 K/uL    Comment: Performed at [facility], [address] [place], [place] [address]  SARS CORONAVIRUS 2 (TAT 6-24 HRS) Nasopharyngeal Nasopharyngeal Swab     Status: None   Collection Time: 08/01/20  7:31 AM   Specimen: Nasopharyngeal Swab  Result Value Ref Range   SARS Coronavirus 2 NEGATIVE NEGATIVE    Comment: (NOTE) SARS-CoV-2 target nucleic acids are NOT DETECTED.  The SARS-CoV-2 RNA is generally detectable in upper and lower respiratory specimens during the acute phase of infection. Negative results do not preclude SARS-CoV-2 infection, do not rule out co-infections with other pathogens, and should not be used as the sole basis for treatment or other patient management decisions. Negative results must be combined with clinical observations, patient history, and epidemiological information. The expected result is Negative.  Fact Sheet for Patients: SugarRoll.be  Fact Sheet for Healthcare Providers: https://www.woods-mathews.com/  This test is not yet approved or cleared by the [place] FDA and  has been authorized for detection and/or diagnosis of SARS-CoV-2 by FDA under an Emergency Use Authorization (EUA). This EUA will remain  in effect (meaning this test can be used) for the duration of the COVID-19 declaration under Se ction 564(b)(1) of the Act, 21 U.S.C. section 360bbb-3(b)(1), unless the authorization is terminated or revoked sooner.  Performed at [facility], [place] [address] [place], [place] [address]   Iron and TIBC     Status: Abnormal   Collection Time: 08/01/20  7:31 AM  Result Value Ref Range   Iron 225 (H) 28 - 170 ug/dL   TIBC 304 250 - 450 ug/dL   Saturation Ratios 74 (H) 10.4 - 31.8 %   UIBC 79  ug/dL    Comment: Performed at [facility], [place] [address], [place], [place] [address]  Ferritin     Status: None   Collection Time: 08/01/20  7:31 AM  Result Value Ref Range   Ferritin 255 11 - 307 ng/mL    Comment: Performed at [facility], [place] [address] [place], [place] [address]  Phosphorus     Status: Abnormal   Collection Time: 08/01/20  7:31 AM  Result Value Ref Range   Phosphorus 6.5 (H) 2.5 - 4.6 mg/dL    Comment: Performed at [facility] [address] [place], [place] [address]  Magnesium     Status: Abnormal   Collection Time: 08/01/20  7:31 AM  Result Value Ref Range   Magnesium 1.5 (L) 1.7 - 2.4 mg/dL    Comment: Performed at [facility] [address], [place], [place] [address]  Comprehensive metabolic panel Once     Status: Abnormal   Collection Time: 08/01/20  7:31 AM  Result Value Ref Range   Sodium 144 135 - 145 mmol/L   Potassium 2.9 (L) 3.5 - 5.1 mmol/L   Chloride 104 98 - 111 mmol/L   CO2 23 22 - 32 mmol/L   Glucose, Bld 96 70 - 99 mg/dL    Comment: Glucose reference range applies only to samples taken after fasting for at least 8 hours.   BUN 103 (H) 6 - 20 mg/dL   Creatinine, Ser 3.81 (H) 0.44 - 1.00 mg/dL   Calcium 6.0 (LL) 8.9 - 10.3 mg/dL    Comment: CRITICAL RESULT CALLED TO, READ BACK BY AND VERIFIED WITH: S.BERTRAND,RN 1020 08/01/20 CLARK,S    Total Protein 5.3 (L) 6.5 - 8.1 g/dL   Albumin 3.1 (L) 3.5 - 5.0 g/dL   AST 15 15 - 41 U/L   ALT 15 0 - 44 U/L   Alkaline Phosphatase 361 (H) 38 - 126 U/L   Total Bilirubin 1.4 (H) 0.3 - 1.2 mg/dL   GFR, Estimated 13 (L) >60 mL/min    Comment: (NOTE) Calculated using the CKD-EPI Creatinine Equation (2021)    Anion gap 17 (H) 5 - 15    Comment: Performed at [facility], [place] [address] [place],  [address]    No results found.  Assessment/Plan **Anemia: suspect multifactorial ? GI blood loss, hemoccult pending and certainly a component of  hypoproliferative due to CKD.  Rec'd dose of ESA 07/15/20, repeat dose today and will need ongoing dosing out of hospital.  Now s/p 2u pRBC, repeat H/H pending this PM  **AKI on CKD:  Renal transplant with recent worsening renal function in setting of CHF and diuresis.  Cr ~3.5 most recently and stable at that level compared to recent hospital discharge but noted is BUN ~ 100 now.  She does not look hypovolemic and I think the BUN ^ may be due to GI bleeding.  F/u hemoccult and if negative would consider holding dose of diuretics but in light of recent massive volume overload favor continuing now so we don't get behind.   **Hypocalcemia, secondary hyperPTH:  Last measured PTH in 800s; PTH resistance in context of CKD.  Double calcitriol to 0.5 BID. She's been started on BID oscal as well.  Trend.  Recheck PTH. Phos 6.5, start binder.   **Hypokalemia:  Replete. 65mEq ordered, f/u tomorrow AMs level.   **s/p renal transplant:  Recent tac troughs low and dose adjusted, check level tomorrow.  Cont current immunosuppression with tac, myfortic and pred.   **CHF:  Continue outpt meds including diuretics (torsemide 80 daily).  2g sodium/fluid restriction, daily weights.   **thrombocytopenia:  Modest in 120s, follow.    **HTN:  Variable BP, monitor for now on current meds.    Will follow closely, page with issues.   Justin Mend 08/01/2020, 12:56 PM

## 2020-08-01 NOTE — ED Notes (Signed)
2nd unit PRBC completed with no adverse effect ,VSS/afebrile , respirations unlabored/denies pain IV site intact.

## 2020-08-01 NOTE — ED Notes (Signed)
Unable to stand up or sit for orthostatic VS.

## 2020-08-02 DIAGNOSIS — Z86010 Personal history of colonic polyps: Secondary | ICD-10-CM

## 2020-08-02 DIAGNOSIS — E539 Vitamin B deficiency, unspecified: Secondary | ICD-10-CM | POA: Diagnosis not present

## 2020-08-02 DIAGNOSIS — E119 Type 2 diabetes mellitus without complications: Secondary | ICD-10-CM | POA: Diagnosis not present

## 2020-08-02 DIAGNOSIS — I5082 Biventricular heart failure: Secondary | ICD-10-CM | POA: Diagnosis present

## 2020-08-02 DIAGNOSIS — D631 Anemia in chronic kidney disease: Secondary | ICD-10-CM | POA: Diagnosis present

## 2020-08-02 DIAGNOSIS — D638 Anemia in other chronic diseases classified elsewhere: Secondary | ICD-10-CM | POA: Diagnosis not present

## 2020-08-02 DIAGNOSIS — D84821 Immunodeficiency due to drugs: Secondary | ICD-10-CM | POA: Diagnosis present

## 2020-08-02 DIAGNOSIS — R5381 Other malaise: Secondary | ICD-10-CM | POA: Diagnosis not present

## 2020-08-02 DIAGNOSIS — E785 Hyperlipidemia, unspecified: Secondary | ICD-10-CM | POA: Diagnosis present

## 2020-08-02 DIAGNOSIS — J9601 Acute respiratory failure with hypoxia: Secondary | ICD-10-CM | POA: Diagnosis not present

## 2020-08-02 DIAGNOSIS — D62 Acute posthemorrhagic anemia: Secondary | ICD-10-CM | POA: Diagnosis present

## 2020-08-02 DIAGNOSIS — I132 Hypertensive heart and chronic kidney disease with heart failure and with stage 5 chronic kidney disease, or end stage renal disease: Secondary | ICD-10-CM | POA: Diagnosis present

## 2020-08-02 DIAGNOSIS — I472 Ventricular tachycardia: Secondary | ICD-10-CM | POA: Diagnosis not present

## 2020-08-02 DIAGNOSIS — I10 Essential (primary) hypertension: Secondary | ICD-10-CM | POA: Diagnosis not present

## 2020-08-02 DIAGNOSIS — N184 Chronic kidney disease, stage 4 (severe): Secondary | ICD-10-CM | POA: Diagnosis not present

## 2020-08-02 DIAGNOSIS — I5023 Acute on chronic systolic (congestive) heart failure: Secondary | ICD-10-CM | POA: Diagnosis not present

## 2020-08-02 DIAGNOSIS — K219 Gastro-esophageal reflux disease without esophagitis: Secondary | ICD-10-CM | POA: Diagnosis not present

## 2020-08-02 DIAGNOSIS — Z86718 Personal history of other venous thrombosis and embolism: Secondary | ICD-10-CM | POA: Diagnosis not present

## 2020-08-02 DIAGNOSIS — Z7901 Long term (current) use of anticoagulants: Secondary | ICD-10-CM

## 2020-08-02 DIAGNOSIS — E1122 Type 2 diabetes mellitus with diabetic chronic kidney disease: Secondary | ICD-10-CM | POA: Diagnosis present

## 2020-08-02 DIAGNOSIS — I5042 Chronic combined systolic (congestive) and diastolic (congestive) heart failure: Secondary | ICD-10-CM | POA: Diagnosis present

## 2020-08-02 DIAGNOSIS — Y830 Surgical operation with transplant of whole organ as the cause of abnormal reaction of the patient, or of later complication, without mention of misadventure at the time of the procedure: Secondary | ICD-10-CM | POA: Diagnosis present

## 2020-08-02 DIAGNOSIS — N185 Chronic kidney disease, stage 5: Secondary | ICD-10-CM | POA: Diagnosis present

## 2020-08-02 DIAGNOSIS — I130 Hypertensive heart and chronic kidney disease with heart failure and stage 1 through stage 4 chronic kidney disease, or unspecified chronic kidney disease: Secondary | ICD-10-CM | POA: Diagnosis not present

## 2020-08-02 DIAGNOSIS — E669 Obesity, unspecified: Secondary | ICD-10-CM | POA: Diagnosis present

## 2020-08-02 DIAGNOSIS — M6281 Muscle weakness (generalized): Secondary | ICD-10-CM | POA: Diagnosis not present

## 2020-08-02 DIAGNOSIS — R195 Other fecal abnormalities: Secondary | ICD-10-CM | POA: Diagnosis not present

## 2020-08-02 DIAGNOSIS — Z7401 Bed confinement status: Secondary | ICD-10-CM | POA: Diagnosis not present

## 2020-08-02 DIAGNOSIS — N179 Acute kidney failure, unspecified: Secondary | ICD-10-CM | POA: Diagnosis present

## 2020-08-02 DIAGNOSIS — M2550 Pain in unspecified joint: Secondary | ICD-10-CM | POA: Diagnosis not present

## 2020-08-02 DIAGNOSIS — F32A Depression, unspecified: Secondary | ICD-10-CM | POA: Diagnosis present

## 2020-08-02 DIAGNOSIS — Z6836 Body mass index (BMI) 36.0-36.9, adult: Secondary | ICD-10-CM | POA: Diagnosis not present

## 2020-08-02 DIAGNOSIS — D509 Iron deficiency anemia, unspecified: Secondary | ICD-10-CM | POA: Diagnosis not present

## 2020-08-02 DIAGNOSIS — I480 Paroxysmal atrial fibrillation: Secondary | ICD-10-CM | POA: Diagnosis present

## 2020-08-02 DIAGNOSIS — Z940 Kidney transplant status: Secondary | ICD-10-CM | POA: Diagnosis not present

## 2020-08-02 DIAGNOSIS — K625 Hemorrhage of anus and rectum: Secondary | ICD-10-CM | POA: Diagnosis not present

## 2020-08-02 DIAGNOSIS — N2581 Secondary hyperparathyroidism of renal origin: Secondary | ICD-10-CM | POA: Diagnosis present

## 2020-08-02 DIAGNOSIS — D6859 Other primary thrombophilia: Secondary | ICD-10-CM | POA: Diagnosis present

## 2020-08-02 DIAGNOSIS — M170 Bilateral primary osteoarthritis of knee: Secondary | ICD-10-CM | POA: Diagnosis not present

## 2020-08-02 DIAGNOSIS — I482 Chronic atrial fibrillation, unspecified: Secondary | ICD-10-CM | POA: Diagnosis not present

## 2020-08-02 DIAGNOSIS — E876 Hypokalemia: Secondary | ICD-10-CM | POA: Diagnosis present

## 2020-08-02 DIAGNOSIS — I5043 Acute on chronic combined systolic (congestive) and diastolic (congestive) heart failure: Secondary | ICD-10-CM | POA: Diagnosis not present

## 2020-08-02 DIAGNOSIS — E8809 Other disorders of plasma-protein metabolism, not elsewhere classified: Secondary | ICD-10-CM | POA: Diagnosis present

## 2020-08-02 DIAGNOSIS — Z20822 Contact with and (suspected) exposure to covid-19: Secondary | ICD-10-CM | POA: Diagnosis present

## 2020-08-02 DIAGNOSIS — D649 Anemia, unspecified: Secondary | ICD-10-CM | POA: Diagnosis not present

## 2020-08-02 DIAGNOSIS — T8619 Other complication of kidney transplant: Secondary | ICD-10-CM | POA: Diagnosis present

## 2020-08-02 DIAGNOSIS — D696 Thrombocytopenia, unspecified: Secondary | ICD-10-CM | POA: Diagnosis present

## 2020-08-02 LAB — CBC
HCT: 20.4 % — ABNORMAL LOW (ref 36.0–46.0)
HCT: 22.4 % — ABNORMAL LOW (ref 36.0–46.0)
Hemoglobin: 6.5 g/dL — CL (ref 12.0–15.0)
Hemoglobin: 7 g/dL — ABNORMAL LOW (ref 12.0–15.0)
MCH: 29.3 pg (ref 26.0–34.0)
MCH: 28.8 pg (ref 26.0–34.0)
MCHC: 31.9 g/dL (ref 30.0–36.0)
MCHC: 31.3 g/dL (ref 30.0–36.0)
MCV: 91.9 fL (ref 80.0–100.0)
MCV: 92.2 fL (ref 80.0–100.0)
Platelets: 122 10*3/uL — ABNORMAL LOW (ref 150–400)
Platelets: 135 10*3/uL — ABNORMAL LOW (ref 150–400)
RBC: 2.22 MIL/uL — ABNORMAL LOW (ref 3.87–5.11)
RBC: 2.43 MIL/uL — ABNORMAL LOW (ref 3.87–5.11)
RDW: 17.8 % — ABNORMAL HIGH (ref 11.5–15.5)
RDW: 18 % — ABNORMAL HIGH (ref 11.5–15.5)
WBC: 6.6 10*3/uL (ref 4.0–10.5)
WBC: 6.3 10*3/uL (ref 4.0–10.5)
nRBC: 0 % (ref 0.0–0.2)
nRBC: 0 % (ref 0.0–0.2)

## 2020-08-02 LAB — PREPARE RBC (CROSSMATCH)

## 2020-08-02 LAB — RETICULOCYTES
Immature Retic Fract: 10 % (ref 2.3–15.9)
RBC.: 2.16 MIL/uL — ABNORMAL LOW (ref 3.87–5.11)
Retic Count, Absolute: 37.8 10*3/uL (ref 19.0–186.0)
Retic Ct Pct: 1.8 % (ref 0.4–3.1)

## 2020-08-02 LAB — BASIC METABOLIC PANEL
Anion gap: 17 — ABNORMAL HIGH (ref 5–15)
BUN: 101 mg/dL — ABNORMAL HIGH (ref 6–20)
CO2: 23 mmol/L (ref 22–32)
Calcium: 6 mg/dL — CL (ref 8.9–10.3)
Chloride: 102 mmol/L (ref 98–111)
Creatinine, Ser: 3.75 mg/dL — ABNORMAL HIGH (ref 0.44–1.00)
GFR, Estimated: 14 mL/min — ABNORMAL LOW (ref >60)
Glucose, Bld: 129 mg/dL — ABNORMAL HIGH (ref 70–99)
Potassium: 3.4 mmol/L — ABNORMAL LOW (ref 3.5–5.1)
Sodium: 142 mmol/L (ref 135–145)

## 2020-08-02 LAB — SAVE SMEAR(SSMR), FOR PROVIDER SLIDE REVIEW: Smear Review: NORMAL

## 2020-08-02 LAB — PROTIME-INR
INR: 1.7 — ABNORMAL HIGH (ref 0.8–1.2)
Prothrombin Time: 19.2 seconds — ABNORMAL HIGH (ref 11.4–15.2)

## 2020-08-02 LAB — APTT: aPTT: 37 seconds — ABNORMAL HIGH (ref 24–36)

## 2020-08-02 LAB — TECHNOLOGIST SMEAR REVIEW

## 2020-08-02 LAB — LACTATE DEHYDROGENASE: LDH: 202 U/L — ABNORMAL HIGH (ref 98–192)

## 2020-08-02 MED ORDER — LOPERAMIDE HCL 2 MG PO CAPS
2.0000 mg | ORAL_CAPSULE | ORAL | Status: DC | PRN
  Administered 2020-08-02 – 2020-08-03 (×3): 2 mg via ORAL
  Filled 2020-08-02 (×3): qty 1

## 2020-08-02 MED ORDER — SEVELAMER CARBONATE 800 MG PO TABS
800.0000 mg | ORAL_TABLET | Freq: Three times a day (TID) | ORAL | Status: DC
  Administered 2020-08-02 – 2020-08-06 (×11): 800 mg via ORAL
  Filled 2020-08-02 (×11): qty 1

## 2020-08-02 MED ORDER — POTASSIUM CHLORIDE CRYS ER 20 MEQ PO TBCR
40.0000 meq | EXTENDED_RELEASE_TABLET | Freq: Once | ORAL | Status: AC
  Administered 2020-08-02: 40 meq via ORAL
  Filled 2020-08-02: qty 2

## 2020-08-02 MED ORDER — HEPARIN (PORCINE) 25000 UT/250ML-% IV SOLN
1300.0000 [IU]/h | INTRAVENOUS | Status: AC
  Administered 2020-08-02 – 2020-08-03 (×2): 1300 [IU]/h via INTRAVENOUS
  Filled 2020-08-02 (×2): qty 250

## 2020-08-02 MED ORDER — SODIUM CHLORIDE 0.9% IV SOLUTION: Freq: Once | INTRAVENOUS | Status: AC

## 2020-08-02 MED ORDER — SODIUM CHLORIDE 0.9% IV SOLUTION: Freq: Once | INTRAVENOUS | Status: DC

## 2020-08-02 MED ORDER — DARBEPOETIN ALFA 100 MCG/0.5ML IJ SOSY
100.0000 ug | PREFILLED_SYRINGE | Freq: Once | INTRAMUSCULAR | Status: AC
  Administered 2020-08-02: 100 ug via SUBCUTANEOUS
  Filled 2020-08-02: qty 0.5

## 2020-08-02 MED ORDER — BISACODYL 5 MG PO TBEC
10.0000 mg | DELAYED_RELEASE_TABLET | Freq: Three times a day (TID) | ORAL | Status: AC
  Administered 2020-08-03 (×2): 10 mg via ORAL
  Filled 2020-08-02 (×2): qty 2

## 2020-08-02 NOTE — Progress Notes (Signed)
ANTICOAGULATION CONSULT NOTE - Initial Consult  Pharmacy Consult for heparin Indication: h/o VTE while apixaban on hold  Allergies  Allergen Reactions  . Infed [Iron Dextran] Other (See Comments)    Pt has chest pain  Chest pain  . Cefazolin Itching and Nausea And Vomiting  . Ancef [Cefazolin Sodium] Other (See Comments)    Tolerated rocephin 11/03/10 Reports ancef causes itching, sweating, vomiting (x2-2009?)  . Lisinopril Swelling  . Other     Pt may be allergic to another antibiotic daughter isn't sure and pt unable to verify.    Patient Measurements: Height: 5\' 11"  (180.3 cm) Weight: 118.3 kg (260 lb 12.9 oz) IBW/kg (Calculated) : 70.8 Heparin Dosing Weight: 93.8kg  Vital Signs: Temp: 98.2 F (36.8 C) (01/26 1427) Temp Source: Oral (01/26 1427) BP: 158/65 (01/26 1427) Pulse Rate: 58 (01/26 1427)  Labs: Recent Labs    07/31/20 1634 08/01/20 0601 08/01/20 0731 08/01/20 1542 08/02/20 1129 08/02/20 1332  HGB 5.3* 7.4*  --  7.1* 6.5*  --   HCT 18.5* 24.5*  --  23.0* 20.4*  --   PLT 128* 129*  --  129* 122*  --   APTT  --   --   --   --   --  37*  LABPROT  --   --   --   --   --  19.2*  INR  --   --   --   --   --  1.7*  CREATININE 3.80*  --  3.81*  --  3.75*  --     Estimated Creatinine Clearance: 23.7 mL/min (A) (by C-G formula based on SCr of 3.75 mg/dL (H)).   Medical History: Past Medical History:  Diagnosis Date  . Acute heart failure ([place]) 06/2020  . Blood transfusion without reported diagnosis   . CHF (congestive heart failure) ([place])   . 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 GERD (gastroesophageal reflux disease)    medication induced  . Hypertension   . Renal failure    hx of  . 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 Patient to have endoscopy and apixaban has been held (last dose this AM ~8am). Pharmacy consulted to dose heparin in the interim while apixaban on hold.   Goal of Therapy:  Heparin level 0.3-0.7 units/ml  APTT 66-102 Monitor platelets by anticoagulation protocol: Yes   Plan:  Heparin drip at 1300 units/hr starting at 2000 tonight (no bolus) HL and APTT in 6 hours after start Will continue to get APTT until HL and APTT correlate.  Daily HL/APTT  Gwenlyn Hottinger A Agastya Meister 08/02/2020,2:43 PM

## 2020-08-02 NOTE — Progress Notes (Signed)
[facility] Progress Note   Subjective:   Feeling ok just dismayed by ongoing anemia and hospitalization.  No new symptoms.  No urinary symptoms.  Says no hemoccult test yet that she's aware of.  I/Os 50/1350 on home torsemide dose.  Cr improved to 3.75, BUN 101 from 103.    Objective Vitals:   08/01/20 1745 08/01/20 2356 08/02/20 0455 08/02/20 1237  BP: (!) 158/83 124/65 134/70 (!) 146/59  Pulse: (!) 58 (!) 58 60 60  Resp: 16 18 18 18   Temp: 98.3 F (36.8 C) 98.1 F (36.7 C) 98.5 F (36.9 C) 97.7 F (36.5 C)  TempSrc: Oral Oral Oral Oral  SpO2: 97% 100% 100% 100%  Weight:   118.3 kg   Height:       Physical Exam Gen: chronically ill but nontoxic  Eyes: anicteric ENT: MMM Neck: supple CV:  RRR, III/VI SEM Abd: soft, nontender, RLQ kidney transplant nontender Lungs: a few scattered rhonchi, normal WOB GU: no foley Extr:  Trace ankle edema Neuro: nonfocal Skin: clotted av access noted LUE  Additional Objective Labs: Basic Metabolic Panel: Recent Labs  Lab 07/31/20 1634 08/01/20 0731 08/02/20 1129  NA 142 144 142  K 3.1* 2.9* 3.4*  CL 103 104 102  CO2 25 23 23   GLUCOSE 163* 96 129*  BUN 100* 103* 101*  CREATININE 3.80* 3.81* 3.75*  CALCIUM 5.9* 6.0* 6.0*  PHOS  --  6.5*  --    Liver Function Tests: Recent Labs  Lab 07/31/20 1634 08/01/20 0731  AST 15 15  ALT 15 15  ALKPHOS 356* 361*  BILITOT 0.6 1.4*  PROT 5.0* 5.3*  ALBUMIN 2.9* 3.1*   No results for input(s): LIPASE, AMYLASE in the last 168 hours. CBC: Recent Labs  Lab 07/31/20 1634 08/01/20 0601 08/01/20 1542 08/02/20 1129  WBC 5.8 6.9 6.8 6.6  NEUTROABS  --  5.7  --   --   HGB 5.3* 7.4* 7.1* 6.5*  HCT 18.5* 24.5* 23.0* 20.4*  MCV 100.0 94.6 95.4 91.9  PLT 128* 129* 129* 122*   Blood Culture    Component Value Date/Time   SDES IN/OUT CATH URINE 07/04/2020 2229   SPECREQUEST  07/04/2020 2229    NONE Performed at [facility] [address], [place],   [address]    CULT MULTIPLE SPECIES PRESENT, SUGGEST RECOLLECTION (A) 07/04/2020 2229   REPTSTATUS 07/08/2020 FINAL 07/04/2020 2229    Cardiac Enzymes: No results for input(s): CKTOTAL, CKMB, CKMBINDEX, TROPONINI in the last 168 hours. CBG: No results for input(s): GLUCAP in the last 168 hours. Iron Studies:  Recent Labs    08/01/20 0731  IRON 225*  TIBC 304  FERRITIN 255   @[email or web address] @ Studies/Results: No results found. Medications:  . amiodarone  200 mg Oral Daily  . apixaban  5 mg Oral BID  . atorvastatin  40 mg Oral QHS  . calcitRIOL  0.5 mcg Oral BID  . calcium-vitamin D  1 tablet Oral BID  . [START ON 08/05/2020] darbepoetin (ARANESP) injection - NON-DIALYSIS  100 mcg Subcutaneous Once  . DULoxetine  20 mg Oral Daily  . ferrous sulfate  325 mg Oral BID WC  . isosorbide-hydrALAZINE  1.5 tablet Oral TID  . metoprolol succinate  25 mg Oral Daily  . mycophenolate  540 mg Oral BID  . pantoprazole  40 mg Oral Daily  . predniSONE  5 mg Oral Q breakfast  . sodium chloride flush  3 mL Intravenous Q12H  .  tacrolimus  5 mg Oral BID  . torsemide  80 mg Oral Daily  . vitamin B-12  1,000 mcg Oral Daily    Assessment/Plan **Anemia: suspect multifactorial ? GI blood loss, hemoccult pending and certainly a component of hypoproliferative due to CKD.  Rec'd dose of ESA 07/15/20, repeated dose 1/26 and will need ongoing dosing out of hospital.  Now s/p 2u pRBC, Hb this AM back into the 6s.   If hemoccult negative would consider involving hematology with new thrombocytopenia.   **AKI on CKD:  Renal transplant with recent worsening renal function in setting of CHF and diuresis.  Cr ~3.5 most recently and stable at that level compared to recent hospital discharge but noted is BUN ~ 100 now.  She does not look hypovolemic and I think the BUN ^ may be due to GI bleeding.  In light of recent massive volume overload favor continuing home diuretic for now so we don't get behind.    **Hypocalcemia, secondary hyperPTH:  Last measured PTH in 800s; PTH resistance in context of CKD.  Doubled calcitriol to 0.5 BID. She's been started on BID oscal as well.  Trend.  Recheck PTH. Phos 6.5, started binder renvela 800 TIDAC   **Hypokalemia:  Repleted, K improved to 3.4 this AM, give 40 more now.  **s/p renal transplant:  Recent tac troughs low and dose adjusted, check level today - looks like collected at 11:30am, dose given 8:10am so won't be accurate.  Reorder for 8pm tonight given recent dose change (occ thrombocytopenia from tac toxicity too).  Cont current immunosuppression with tac, myfortic and pred.   **CHF:  Continue outpt meds including diuretics (torsemide 80 daily).  2g sodium/fluid restriction, daily weights.   **thrombocytopenia:  Modest in 120s, follow.  Smear pending.   **HTN:  Variable BP, monitor for now on current meds.    Will follow closely, page with issues.   Jannifer Hick MD 08/02/2020, 1:43 PM  [facility] Pager: [phone]

## 2020-08-02 NOTE — Progress Notes (Signed)
CRITICAL VALUE ALERT  Critical Value:  Hgb 6.5  Date & Time Notied:  1/26 1235  Provider Notified: Yes  Orders Received/Actions taken: Waiting For orders

## 2020-08-02 NOTE — Consult Note (Addendum)
[facility] Consult: 1:33 PM 08/02/2020  LOS: 0 days    Referring Provider: Dr Rebeca Alert, Graciella Freer Primary Care Physician:  [name], DO Primary Gastroenterologist:  Dr. Ardis Hughs    Reason for Consultation:  Anemia.     HPI: [name] is a 55 y.o. female.  PMH heart failure.  Anemia of chronic disease.  Hypertension.  Hyperlipidemia.  Diabetes mellitus.  ESRD, renal transplant 2011.  Now with stage 5 CKD.  DVT, on chronic Eliquis, last dose given this morning.  Systolic, diastolic heart failure.  LVEF 25 to 30%.  In 06/2020.  Status post cholecystectomy.  Anemia, transfuse 1 PRBC in 03/2020, 2 PRBCs in 07/2020 during recent admission.  Chronic bid po iron.    04/2017 Colonoscopy.  Average risk screening study.  4 polyps removed the size was 2 to 15 mm.  Left colon diverticulosis.  Otherwise normal study to IC valve.  Pathology: TAs without HGD as well as inflamed prolapse type polyp.  Letter sent suggesting colonoscopy in 04/2020.  10/2019 CTAP without contrast showed chronically absent gallbladder.  Normal noncontrast liver.  Pancreatic atrophy, normal spleen.  Redundant sigmoid with retained colonic stool.  Cecum "is on a lax mesentery".  Stomach, small bowel unremarkable. Patient tells me she has had upper endoscopy many years ago but there is no records of this in epic  12/28 -07/20/2020 admission with volume overload, hypoxia.  Amiodarone initiated for A. fib.  Tacrolimus levels low, dose increased.  Hgb as low as 6.8, 7.7 on the day of discharge. FOBT negative. Sapovirus positive on 07/11/20 stool PCR panel  Received 2 PRBCs, Aranesp initiated.  Discharged to [facility].  Patient is feeling well.  Denies weakness, dizziness, chest pain, shortness of breath.  She is standing but not able to walk yet.   Routine lab work with Hgb 5.3 on 1/24 prompted sending her to [place].  >> transfused 2 PRBCs on 1/25 >> 7.4 >> 6.5 this morning.  No repeat Hemoccults.   Platelets are also low in the 120s.  No previous thrombocytopenia. INR 1.4.  Patient has periodic loose stools alternating with formed stools.  Her stools are always dark because she is on the twice daily oral iron.  Takes omeprazole 40 mg daily.  No aspirin.  Denies abdominal pain, anorexia, dysphagia, pyrosis.     No history of alcohol use/abuse.  Family history of colorectal cancer, IBD, ulcers, anemia.   Past Medical History:  Diagnosis Date  . Acute heart failure ([place]) 06/2020  . Blood transfusion without reported diagnosis   . CHF (congestive heart failure) ([place])   . 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 GERD (gastroesophageal reflux disease)    medication induced  . Hypertension   . Renal failure    hx of  . 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 [place]  . CHOLECYSTECTOMY  2008  . COLONOSCOPY  10 years ago   Dr.Hung="normal"  . DG AV DIALYSIS  SHUNT ACCESS EXIST*L* OR    . KIDNEY TRANSPLANT  2011  . UMBILICAL HERNIA REPAIR  2009    Prior to Admission medications   Medication Sig Start Date End Date Taking? Authorizing Provider  acetaminophen (TYLENOL) 325 MG tablet Take 2 tablets (650 mg total) by mouth every 6 (six) hours as needed for mild pain (or Fever >/= 101). 03/24/20  Yes Marianna Payment, MD  albuterol (PROVENTIL) (2.5 MG/3ML) 0.083% nebulizer solution Take 3 mLs (2.5 mg total) by nebulization every 6 (six) hours as needed for wheezing or shortness  of breath. 03/24/20  Yes Marianna Payment, MD  amiodarone (PACERONE) 200 MG tablet Take 1 tablet (200 mg total) by mouth daily. 07/21/20 08/20/20 Yes Rehman, Areeg N, DO  apixaban (ELIQUIS) 5 MG TABS tablet Take 1 tablet (5 mg total) by mouth 2 (two) times daily. 04/26/20 10/01/20 Yes Angelica Pou, MD  atorvastatin (LIPITOR) 40 MG tablet Take 1 tablet (40 mg total) by mouth daily. Patient taking differently: Take 40 mg by mouth at bedtime. 03/25/20  Yes Marianna Payment, MD  calcitRIOL (ROCALTROL) 0.5 MCG capsule Take 1 capsule (0.5 mcg total) by mouth daily. 03/25/20  Yes Marianna Payment, MD  calcium-vitamin D (OSCAL WITH D) 500-200 MG-UNIT tablet Take 1 tablet by mouth 2 (two) times daily. 03/24/20  Yes Marianna Payment, MD  Darbepoetin Alfa (ARANESP) 60 MCG/0.3ML SOSY injection Inject 0.3 mLs (60 mcg total) into the skin every Saturday at 6 PM. 07/22/20  Yes Rehman, Areeg N, DO  DULoxetine (CYMBALTA) 30 MG capsule Take 1 capsule (30 mg total) by mouth daily. 03/25/20  Yes Marianna Payment, MD  ferrous sulfate 325 (65 FE) MG tablet Take 1 tablet (325 mg total) by mouth 2 (two) times daily with a meal. 03/24/20  Yes Marianna Payment, MD  guaiFENesin-dextromethorphan (ROBITUSSIN DM) 100-10 MG/5ML syrup Take 5 mLs by mouth every 4 (four) hours as needed for cough. 03/24/20  Yes Marianna Payment, MD  insulin aspart (NOVOLOG) 100 UNIT/ML injection Inject 0-20 Units into the skin 3 (three) times daily with meals. 03/24/20  Yes Marianna Payment, MD  isosorbide-hydrALAZINE (BIDIL) 20-37.5 MG tablet Take 2 tablets by mouth 3 (three) times daily. 07/20/20  Yes Rehman, Areeg N, DO  loperamide (IMODIUM) 2 MG capsule Take 2 capsules (4 mg total) by mouth daily as needed for diarrhea or loose stools. 07/20/20  Yes Rehman, Areeg N, DO  loratadine (CLARITIN) 10 MG tablet Take 1 tablet (10 mg total) by mouth daily. Patient taking differently: Take 10 mg by mouth daily as needed for allergies. 03/25/20  Yes Marianna Payment, MD  metoprolol  succinate (TOPROL-XL) 25 MG 24 hr tablet Take 1 tablet (25 mg total) by mouth daily. 03/25/20  Yes Marianna Payment, MD  mycophenolate (MYFORTIC) 180 MG EC tablet TAKE 3 TABLETS BY MOUTH TWICE DAILY Patient taking differently: Take 540 mg by mouth 2 (two) times daily. 03/31/20  Yes Maudie Mercury, MD  omeprazole (PRILOSEC) 40 MG capsule Take 40 mg by mouth daily. 05/22/20  Yes [provider]  predniSONE (DELTASONE) 5 MG tablet Take 1 tablet (5 mg total) by mouth daily with breakfast. 07/21/20  Yes Rehman, Areeg N, DO  tacrolimus (PROGRAF) 5 MG capsule Take  1 capsule (5 mg total) by mouth 2 (two) times daily. 07/20/20  Yes Rehman, Areeg N, DO  torsemide (DEMADEX) 20 MG tablet Take 4 tablets (80 mg total) by mouth daily. 07/21/20  Yes Rehman, Areeg N, DO  triamcinolone ointment (KENALOG) 0.1 % Apply 1 application topically 2 (two) times daily as needed (rash). 02/24/20  Yes [provider]  vitamin B-12 (CYANOCOBALAMIN) 1000 MCG tablet Take 1 tablet (1,000 mcg total) by mouth daily. 10/11/19  Yes Ladona Horns, MD    Scheduled Meds: . amiodarone  200 mg Oral Daily  . apixaban  5 mg Oral BID  . atorvastatin  40 mg Oral QHS  . calcitRIOL  0.5 mcg Oral BID  . calcium-vitamin D  1 tablet Oral BID  . [START ON 08/05/2020] darbepoetin (ARANESP) injection - NON-DIALYSIS  100 mcg Subcutaneous Once  . DULoxetine  20 mg Oral Daily  . ferrous sulfate  325 mg Oral BID WC  . isosorbide-hydrALAZINE  1.5 tablet Oral TID  . metoprolol succinate  25 mg Oral Daily  . mycophenolate  540 mg Oral BID  . pantoprazole  40 mg Oral Daily  . predniSONE  5 mg Oral Q breakfast  . sodium chloride flush  3 mL Intravenous Q12H  . tacrolimus  5 mg Oral BID  . torsemide  80 mg Oral Daily  . vitamin B-12  1,000 mcg Oral Daily   Infusions:  PRN Meds: acetaminophen, guaiFENesin-dextromethorphan, loperamide, polyethylene glycol   Allergies as of 07/31/2020 - Review Complete 07/31/2020  Allergen Reaction Noted   . Infed [iron dextran] Other (See Comments) 12/09/2011  . Cefazolin Itching and Nausea And Vomiting 11/30/2015  . Ancef [cefazolin sodium] Other (See Comments) 11/03/2011  . Lisinopril Swelling 11/30/2015  . Other  11/03/2011    Family History  Problem Relation Age of Onset  . Hypertension Mother   . Diabetes Mother   . Atrial fibrillation Father   . Hypertension Father   . Arthritis Father   . Heart failure Sister   . Hypertension Sister        all 4 sisters  . Hypertension Brother        both brothers  . Diabetes Brother   . Colon cancer Neg Hx   . Stomach cancer Neg Hx     Social History   Socioeconomic History  . Marital status: Single    Spouse name: Not on file  . Number of children: 2  . Years of education: 47  . Highest education level: Not on file  Occupational History    Employer: UNEMPLOYED  Tobacco Use  . Smoking status: Former Smoker    Packs/day: 0.50    Types: Cigarettes  . Smokeless tobacco: Never Used  . Tobacco comment: 10 some days   Vaping Use  . Vaping Use: Never used  Substance and Sexual Activity  . Alcohol use: Never    Alcohol/week: 0.0 standard drinks  . Drug use: Never  . Sexual activity: Not Currently  Other Topics Concern  . 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 Difficulty of Paying Living Expenses: Somewhat hard  Food Insecurity: Food Insecurity Present  . Worried About Charity fundraiser in the Last Year: Sometimes true  . Ran Out of Food in the Last Year: Sometimes true  Transportation Needs: No Transportation Needs  . Lack of  Transportation (Medical): No  . Lack of Transportation (Non-Medical): No  Physical Activity: Inactive  . Days of Exercise per Week: 0 days  . Minutes of Exercise per Session: 0 min  Stress: Not on file  Social Connections: Unknown  . Frequency of Communication with Friends and Family: More than three times a week  . Frequency of Social Gatherings with Friends and Family: More than three times a week  . Attends Religious Services: Not on file  . Active Member of Clubs or Organizations: Not on file  . Attends Archivist Meetings: Not on file  . Marital Status: Not on file  Intimate Partner Violence: Not on file    REVIEW OF SYSTEMS: Constitutional: Energy overall is better than it was when she was in the hospital earlier this month but not yet back to baseline when she was able to walk around her home. ENT:  No nose bleeds Pulm: Denies shortness of breath and cough. CV:  No palpitations, no LE edema.  GU:  No hematuria, no frequency GI: See HPI. Heme: No unusual bleeding or bruising. Transfusions: See HPI. Neuro:  No headaches, no peripheral tingling or numbness.  No seizures, no syncope. Derm:  No itching, no rash or sores.  Endocrine:  No sweats or chills.  No polyuria or dysuria Immunization: Vaccinated for COVID-19 Travel:  None beyond local counties in last few months.    PHYSICAL EXAM: Vital signs in last 24 hours: Vitals:   08/02/20 0455 08/02/20 1237  BP: 134/70 (!) 146/59  Pulse: 60 60  Resp: 18 18  Temp: 98.5 F (36.9 C) 97.7 F (36.5 C)  SpO2: 100% 100%   Wt Readings from Last 3 Encounters:  08/02/20 118.3 kg  07/20/20 123.8 kg  04/14/20 106.6 kg    General: Obese, pleasant, looks moderately chronically ill but not acutely ill. Head: No facial asymmetry or swelling.  No signs of head trauma. Eyes: No conjunctival pallor Ears: Not hard of hearing Nose: No congestion or discharge Mouth: Fair dentition.  Tongue midline.  Mucosa pink moist and  clear. Neck: No JVD, no thyromegaly. Lungs: Clear bilaterally.  No labored breathing.   Heart: RRR.  Soft diastolic murmur.  S1, S2 audible. Abdomen: Obese, soft, nontender.  Well-healed surgical scars.  No masses, HSM, bruits, hernias.  Active bowel sounds..   Rectal: Deferred Musc/Skeltl: No joint redness, swelling or gross deformities. Extremities: Slight pitting edema on the right foot. Neurologic: Alert.  Fluid speech.  Oriented x3.  Moves all 4 limbs without tremor, strength not tested. Skin: Wrinkling of the skin in the lower legs consistent with previous edema. Nodes: No cervical adenopathy Psych: Cooperative, pleasant, calm, fluid speech.  Intake/Output from previous day: 01/25 0701 - 01/26 0700 In: 50 [IV Piggyback:50] Out: 8756 [Urine:1350] Intake/Output this shift: No intake/output data recorded.  LAB RESULTS: Recent Labs    08/01/20 0601 08/01/20 1542 08/02/20 1129  WBC 6.9 6.8 6.6  HGB 7.4* 7.1* 6.5*  HCT 24.5* 23.0* 20.4*  PLT 129* 129* 122*   BMET Lab Results  Component Value Date   NA 142 08/02/2020   NA 144 08/01/2020   NA 142 07/31/2020   K  3.4 (L) 08/02/2020   K 2.9 (L) 08/01/2020   K 3.1 (L) 07/31/2020   CL 102 08/02/2020   CL 104 08/01/2020   CL 103 07/31/2020   CO2 23 08/02/2020   CO2 23 08/01/2020   CO2 25 07/31/2020   GLUCOSE 129 (H) 08/02/2020   GLUCOSE 96 08/01/2020   GLUCOSE 163 (H) 07/31/2020   BUN 101 (H) 08/02/2020   BUN 103 (H) 08/01/2020   BUN 100 (H) 07/31/2020   CREATININE 3.75 (H) 08/02/2020   CREATININE 3.81 (H) 08/01/2020   CREATININE 3.80 (H) 07/31/2020   CALCIUM 6.0 (LL) 08/02/2020   CALCIUM 6.0 (LL) 08/01/2020   CALCIUM 5.9 (LL) 07/31/2020   LFT Recent Labs    07/31/20 1634 08/01/20 0731  PROT 5.0* 5.3*  ALBUMIN 2.9* 3.1*  AST 15 15  ALT 15 15  ALKPHOS 356* 361*  BILITOT 0.6 1.4*   PT/INR Lab Results  Component Value Date   INR 1.4 (H) 07/04/2020   INR 1.2 03/24/2020   INR 1.3 (H) 03/16/2020    Hepatitis Panel No results for input(s): HEPBSAG, HCVAB, HEPAIGM, HEPBIGM in the last 72 hours. C-Diff No components found for: CDIFF Lipase     Component Value Date/Time   LIPASE 45 06/01/2018 0856      RADIOLOGY STUDIES: No results found.    IMPRESSION:   *   Acute on chronic anemia. Received 2 units PRBCs and Aranesp initiated during admission earlier this month and 2 units thus far this admission.  Was FOBT negative when tested on 07/09/2019  *   Adenomatous colon polyps 04/2016.  Overdue for colonoscopy 04/2020.  *   Chronic Eliquis for history DVT.  Latest dose this morning.  *    Stage 5 CKD in transplanted kidney.  Renal transplant 2011 at [facility].  Recent increase tacrolimus dosing.  Mycophenolate, low-dose prednisone on board as well    PLAN:     *    Hold Eliquis as you have done.  Plan colonoscopy and upper endoscopy on 1/28.  Stop oral iron as it will interfere with efficient bowel prep.  Begin clear liquids tomorrow morning and bowel prep tomorrow night.  Discussed upper endoscopy and colonoscopy with the patient and she is agreeable to proceed   [name]  08/02/2020, 1:33 PM Phone [phone]  GI ATTENDING  History, laboratories, x-rays, prior colonoscopy report reviewed.  Patient seen and examined.  Agree with comprehensive consultation note as outlined above.  55 year old with MULTIPLE significant medical problems.  Her anemia is multifactorial, but worsening.  She is on chronic anticoagulation therapy.  She is overdue for surveillance colonoscopy for multiple adenomatous polyps.  Though she is HIGH RISK for endoscopic procedures given her comorbidities and anticoagulation status, agree with plans for surveillance colonoscopy to rule out significant interval neoplasia as well as upper endoscopy to rule out significant upper GI mucosal pathology.  This in particular she will be on chronic anticoagulation therapy.  Agree with transfusing to the  clinically appropriate hemoglobin level and with PPI therapy.  Docia Chuck. Geri Seminole., M.D. [facility] Division of Gastroenterology

## 2020-08-02 NOTE — Progress Notes (Signed)
CRITICAL VALUE ALERT  Critical Value:  Calcium 6.0  Date & Time Notied:  3225 1/26  Provider Notified: Yes   Orders Received/Actions taken: Awaiting call back

## 2020-08-02 NOTE — Progress Notes (Addendum)
Subjective: Patient seen in the AM. Expressed doing well. Feels energy is much improved from admission; and endorses breathing well. She had a large bowel movement prior. She reports needing loperamide off and on.  Patient Hgb dropped to 6.5 today. VSS. GI subsequently consulted.   Objective:  Vital signs in last 24 hours: Vitals:   08/01/20 1745 08/01/20 2356 08/02/20 0455 08/02/20 1237  BP: (!) 158/83 124/65 134/70 (!) 146/59  Pulse: (!) 58 (!) 58 60 60  Resp: $Remo'16 18 18 18  'Ewvzq$ Temp: 98.3 F (36.8 C) 98.1 F (36.7 C) 98.5 F (36.9 C) 97.7 F (36.5 C)  TempSrc: Oral Oral Oral Oral  SpO2: 97% 100% 100% 100%  Weight:   118.3 kg   Height:       Weight change: 12.2 kg  Intake/Output Summary (Last 24 hours) at 08/02/2020 1422 Last data filed at 08/02/2020 0005 Gross per 24 hour  Intake 50 ml  Output 400 ml  Net -350 ml   Physical Exam Constitutional:      General: She is not in acute distress.    Appearance: Normal appearance. She is obese. She is not ill-appearing, toxic-appearing or diaphoretic.     Comments: Pleasant lady seen lying comfortably in bed.   Cardiovascular:     Rate and Rhythm: Normal rate and regular rhythm.  Pulmonary:     Effort: Pulmonary effort is normal. No respiratory distress.  Neurological:     Mental Status: She is alert. Mental status is at baseline.  Psychiatric:        Mood and Affect: Mood normal.        Behavior: Behavior normal.     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 S/p 2 units of RBCs, with appropriate rise in Hgb. Hgb subsequently dropped to 6.5, with GI consequently consulted.   Anemia of chronic disease Patient presented with severe anemia with Hgb 5.3. Baseline Hgb around 7-8 for the past year. During prior admission earlier this month, noted to have Hgb down to 6.8 requiring pRBCs with appropriate response. Started on weekly Aranesp by nephrology on 07/15/20. Patient received 2 units of RBCs, with appropriate rise in Hgb to 7.4.  Hgb subsequently dropped to 6.5 today, with GI consequently consulted. She is stable and denies symptoms of anemia at present. Retic count low, suggesting hypoproliferative, with thrombocytopenia, raises concern for bone marrow problem. Will follow up smear then consider consulting hematology.  -GI consulted for drop in Hgb, plans for endoscopy in ~1-2 days at this time, f/u recs -To receive another unit pRBCs today -Discontinued Eliquis; starting Heparin per pharmacy -f/u H & H this evening -trend CBC -transfuse for Hgb <7 and/or active bleeding -continue B12 and ferrous sulfate  Renal failure s/p cadaver renal transplant in 2011 On immunosuppression with prograf, myfortic, and chronic prednisone CKD Stage V Patient's creatinine has been fluctuating, but progressively worsening over the past few years so unable to establish baseline. Her eGFR is now 35, so patient is  now considered to have progressed to CKD Stage V. CMP also showing BUN 100, which is increased from priors. Followed by [facility]. On prograf 5mg  BID, myfortic 540mg  BID, and prednisone 5mg  daily for immunosuppression. BUN remains elevated at 101; phosphorus newly elevated to 6.5.   -Nephrology consulted, f/u recs -continue home myfortic, prograf, and prednisone -Tacrolimus levels pending -monitor UOP -avoid nephrotoxic medications   Secondary hyperparathyroidism from  CKD Hypocalcemia Hypokalemia Calcium of 5.9 on admission. Corrected calcium ~6.7. Secondary hyperparathyroidism also thought to be the cause of her elevated alk phos (had negative multiple myeloma workup in the past). Initially given IV calcium gluconate 1g. Phosphorus noted to be newly elevated at 6.5. Will defer further calcium supplementation at this time.  -Appreciate Nephrology recommendations   -start binder  -increase calcitriol, calcium-vitamin D -trend BMP   Hypokalemia Hypomagnesemia  K 3.1 on admission. Chronically low. Improved with potassium and magnesium repletion yesterday.    -trend BMP -Repleting K again today   Chronic biventricular heart failure Atrial fibrillation (on eliquis) HTN HLD On 2L O2 at baseline Recent hospital admission for HF exacerbation s/p extensive diuresis (~60 lbs). ECHO during prior admission showing EF 25-30% with mild LV dilation, severe LVH, moderately decreased RV function/mild RV enlargement, moderate aortic insuffficiency. Patient does have 1-2+ pitting edema in bilateral lower extremities, but will need to be cautious with diuresis given CKD stage V. Most recent dry weight appears to be around 273 lbs.    -Holding eliquis 5mg  BID for A-fib in light of potential GI bleed. Heparin drip. -continue torsemide 80mg  daily for HF -continue bidil 1.5 tabs TID, toprol XL 25mg  daily for HTN -continue amiodarone 200mg  daily  -continue lipitor 40mg  daily for HLD -strict I/Os, daily weights, fluid restriction -avoid ACEI, ARB, spironolactone, digoxin given CKD stage V  Hx of DVT Antithrombin III deficiency RUE doppler during prior admission showing age-indeterminate DVT of right axillary vein and age-indeterminate superficial vein thrombosis of right cephalic vein, unchanged from priors. ATIII levels found to be 57% of normal in 03/2020, thought to be due to nephrotic syndrome. -Holding eliquis 5mg  BID for A-fib in light of potential GI bleed.  Heparin drip. -SCDs   GERD -continue home protonix 40mg  daily   Depression -Home Cymbalta dose decreased to 20 mg daily in light of renal clearance   LOS: 0 days   Azell Der, Medical Student 08/02/2020, 2:22 PM

## 2020-08-02 NOTE — Progress Notes (Signed)
Paged MD phone's awaiting call back to inform about critical levels.

## 2020-08-03 DIAGNOSIS — D649 Anemia, unspecified: Secondary | ICD-10-CM | POA: Diagnosis not present

## 2020-08-03 DIAGNOSIS — Z7901 Long term (current) use of anticoagulants: Secondary | ICD-10-CM

## 2020-08-03 DIAGNOSIS — I5082 Biventricular heart failure: Secondary | ICD-10-CM | POA: Diagnosis not present

## 2020-08-03 DIAGNOSIS — K625 Hemorrhage of anus and rectum: Secondary | ICD-10-CM | POA: Diagnosis not present

## 2020-08-03 DIAGNOSIS — F32A Depression, unspecified: Secondary | ICD-10-CM | POA: Diagnosis not present

## 2020-08-03 DIAGNOSIS — N184 Chronic kidney disease, stage 4 (severe): Secondary | ICD-10-CM | POA: Diagnosis not present

## 2020-08-03 LAB — HEPATIC FUNCTION PANEL
ALT: 14 U/L (ref 0–44)
AST: 15 U/L (ref 15–41)
Albumin: 3.1 g/dL — ABNORMAL LOW (ref 3.5–5.0)
Alkaline Phosphatase: 397 U/L — ABNORMAL HIGH (ref 38–126)
Bilirubin, Direct: 0.2 mg/dL (ref 0.0–0.2)
Indirect Bilirubin: 0.6 mg/dL (ref 0.3–0.9)
Total Bilirubin: 0.8 mg/dL (ref 0.3–1.2)
Total Protein: 5.3 g/dL — ABNORMAL LOW (ref 6.5–8.1)

## 2020-08-03 LAB — CBC
HCT: 23.8 % — ABNORMAL LOW (ref 36.0–46.0)
Hemoglobin: 7.8 g/dL — ABNORMAL LOW (ref 12.0–15.0)
MCH: 29.7 pg (ref 26.0–34.0)
MCHC: 32.8 g/dL (ref 30.0–36.0)
MCV: 90.5 fL (ref 80.0–100.0)
Platelets: 129 10*3/uL — ABNORMAL LOW (ref 150–400)
RBC: 2.63 MIL/uL — ABNORMAL LOW (ref 3.87–5.11)
RDW: 18.2 % — ABNORMAL HIGH (ref 11.5–15.5)
WBC: 6.4 10*3/uL (ref 4.0–10.5)
nRBC: 0 % (ref 0.0–0.2)

## 2020-08-03 LAB — HEPARIN LEVEL (UNFRACTIONATED): Heparin Unfractionated: 1.5 IU/mL — ABNORMAL HIGH (ref 0.30–0.70)

## 2020-08-03 LAB — RENAL FUNCTION PANEL
Albumin: 3 g/dL — ABNORMAL LOW (ref 3.5–5.0)
Anion gap: 17 — ABNORMAL HIGH (ref 5–15)
BUN: 103 mg/dL — ABNORMAL HIGH (ref 6–20)
CO2: 22 mmol/L (ref 22–32)
Calcium: 6.1 mg/dL — CL (ref 8.9–10.3)
Chloride: 103 mmol/L (ref 98–111)
Creatinine, Ser: 3.79 mg/dL — ABNORMAL HIGH (ref 0.44–1.00)
GFR, Estimated: 13 mL/min — ABNORMAL LOW (ref >60)
Glucose, Bld: 95 mg/dL (ref 70–99)
Phosphorus: 6.2 mg/dL — ABNORMAL HIGH (ref 2.5–4.6)
Potassium: 3.5 mmol/L (ref 3.5–5.1)
Sodium: 142 mmol/L (ref 135–145)

## 2020-08-03 LAB — PROTIME-INR
INR: 1.4 — ABNORMAL HIGH (ref 0.8–1.2)
Prothrombin Time: 16.6 seconds — ABNORMAL HIGH (ref 11.4–15.2)

## 2020-08-03 LAB — APTT
aPTT: 74 seconds — ABNORMAL HIGH (ref 24–36)
aPTT: 69 seconds — ABNORMAL HIGH (ref 24–36)

## 2020-08-03 LAB — PREPARE RBC (CROSSMATCH)

## 2020-08-03 LAB — MAGNESIUM: Magnesium: 1.5 mg/dL — ABNORMAL LOW (ref 1.7–2.4)

## 2020-08-03 LAB — PARATHYROID HORMONE, INTACT (NO CA): PTH: 36 pg/mL (ref 15–65)

## 2020-08-03 MED ORDER — POTASSIUM CHLORIDE CRYS ER 20 MEQ PO TBCR: 40.0000 meq | EXTENDED_RELEASE_TABLET | Freq: Every day | ORAL | Status: DC

## 2020-08-03 MED ORDER — MAGNESIUM SULFATE 2 GM/50ML IV SOLN: 2.0000 g | Freq: Once | INTRAVENOUS | Status: DC

## 2020-08-03 MED ORDER — CALCIUM CARBONATE-VITAMIN D 500-200 MG-UNIT PO TABS
1.0000 | ORAL_TABLET | Freq: Three times a day (TID) | ORAL | Status: DC
  Administered 2020-08-03 – 2020-08-06 (×10): 1 via ORAL
  Filled 2020-08-03 (×10): qty 1

## 2020-08-03 MED ORDER — PEG-KCL-NACL-NASULF-NA ASC-C 100 G PO SOLR
0.5000 | Freq: Once | ORAL | Status: AC
  Administered 2020-08-04: 100 g via ORAL
  Filled 2020-08-03 (×2): qty 1

## 2020-08-03 MED ORDER — TORSEMIDE 20 MG PO TABS: 80.0000 mg | ORAL_TABLET | Freq: Every day | ORAL | Status: DC

## 2020-08-03 MED ORDER — METOCLOPRAMIDE HCL 5 MG/ML IJ SOLN
10.0000 mg | Freq: Once | INTRAMUSCULAR | Status: AC
  Administered 2020-08-04: 10 mg via INTRAVENOUS
  Filled 2020-08-03: qty 2

## 2020-08-03 MED ORDER — PEG-KCL-NACL-NASULF-NA ASC-C 100 G PO SOLR: 1.0000 | Freq: Once | ORAL | Status: DC

## 2020-08-03 MED ORDER — MAGNESIUM SULFATE 4 GM/100ML IV SOLN
4.0000 g | Freq: Once | INTRAVENOUS | Status: AC
  Administered 2020-08-03: 4 g via INTRAVENOUS
  Filled 2020-08-03: qty 100

## 2020-08-03 MED ORDER — POTASSIUM CHLORIDE CRYS ER 20 MEQ PO TBCR
40.0000 meq | EXTENDED_RELEASE_TABLET | Freq: Four times a day (QID) | ORAL | Status: AC
  Administered 2020-08-03 (×2): 40 meq via ORAL
  Filled 2020-08-03 (×2): qty 2

## 2020-08-03 MED ORDER — PEG-KCL-NACL-NASULF-NA ASC-C 100 G PO SOLR
0.5000 | Freq: Once | ORAL | Status: AC
  Administered 2020-08-03: 100 g via ORAL
  Filled 2020-08-03: qty 1

## 2020-08-03 MED ORDER — METOCLOPRAMIDE HCL 5 MG/ML IJ SOLN
10.0000 mg | Freq: Once | INTRAMUSCULAR | Status: AC
  Administered 2020-08-03: 10 mg via INTRAVENOUS
  Filled 2020-08-03: qty 2

## 2020-08-03 NOTE — Progress Notes (Addendum)
Daily Rounding Note  08/03/2020, 10:25 AM  LOS: 1 day   SUBJECTIVE:   Chief complaint: Anemia.  Passing bloody stools.  Still no abdominal pain.  No nausea, vomiting.  Blood sugars dropped into the 60s today as attending physician had made her n.p.o.  Clear liquids ordered this morning.  OBJECTIVE:         Vital signs in last 24 hours:    Temp:  [97.7 F (36.5 C)-98.5 F (36.9 C)] 98.1 F (36.7 C) (01/27 0643) Pulse Rate:  [57-60] 57 (01/27 0643) Resp:  [18-20] 20 (01/27 0643) BP: (137-161)/(59-88) 147/74 (01/27 0643) SpO2:  [100 %] 100 % (01/27 0643) Weight:  [119.4 kg] 119.4 kg (01/27 0643) Last BM Date: 08/01/20 Filed Weights   07/31/20 1625 08/02/20 0455 08/03/20 2130  Weight: 106.1 kg 118.3 kg 119.4 kg   General: Overweight, looks well.  NAD. Heart: RRR Chest: Clear bilaterally.  No labored breathing. Abdomen: Obese, soft, nontender.  Active bowel sounds. Extremities: No CCE. Neuro/Psych: Alert.  Appropriate.  Oriented x3.  Fluid speech.  Moves all 4 limbs without tremor.  Strength not tested.  Intake/Output from previous day: 01/26 0701 - 01/27 0700 In: 1840.2 [P.O.:560; I.V.:316.2; Blood:964] Out: 1600 [QMVHQ:[id number]]  Intake/Output this shift: No intake/output data recorded.  Lab Results: Recent Labs    08/02/20 1129 08/02/20 2117 08/03/20 0637  WBC 6.6 6.3 6.4  HGB 6.5* 7.0* 7.8*  HCT 20.4* 22.4* 23.8*  PLT 122* 135* 129*   BMET Recent Labs    08/01/20 0731 08/02/20 1129 08/03/20 0637  NA 144 142 142  K 2.9* 3.4* 3.5  CL 104 102 103  CO2 23 23 22   GLUCOSE 96 129* 95  BUN 103* 101* 103*  CREATININE 3.81* 3.75* 3.79*  CALCIUM 6.0* 6.0* 6.1*   LFT Recent Labs    07/31/20 1634 08/01/20 0731 08/03/20 0637  PROT 5.0* 5.3*  --   ALBUMIN 2.9* 3.1* 3.0*  AST 15 15  --   ALT 15 15  --   ALKPHOS 356* 361*  --   BILITOT 0.6 1.4*  --    PT/INR Recent Labs    08/02/20 1332   LABPROT 19.2*  INR 1.7*   Hepatitis Panel No results for input(s): HEPBSAG, HCVAB, HEPAIGM, HEPBIGM in the last 72 hours.  Studies/Results: No results found.  ASSESMENT:   *   Acute on chronic anemia.  Previous blood transfusions 03/2020 and early 07/2020. Transfuse PRBCs and Aranesp initiated during hospitalization early 07/2020.   Adenomatous colon polyps 04/2016, overdue for 04/2020 colonoscopy.  *   Chronic Eliquis for hx DVT, A. fib diagnosis 07/2020.[name]  Last dose 1/26 in AM.  IV heparin in place.  *    CKD 5 in patient s/p kidney transplant 2011.   PLAN   *  Colonoscopy, EGD planned for noon tomorrow 08/04/2020  *    see split dose movie prep instructions, Dulcolax, clear liquids, n.p.o. after 5 AM  *     Stop heparin drip at 0700.     Azucena Freed  08/03/2020, 10:25 AM Phone [phone]  GI ATTENDING  Interval history and data reviewed.  Patient seen and examined.  Clinically stable.  Potassium corrected.  Hemoglobin 7.8.  Eliquis washing out.  Plans for colonoscopy and upper endoscopy tomorrow to evaluate GI bleeding.  Patient is HIGH RISK.The nature of the procedure, as well as the risks, benefits, and alternatives were carefully and thoroughly reviewed  with the patient. Ample time for discussion and questions allowed. The patient understood, was satisfied, and agreed to proceed.  Docia Chuck. Geri Seminole., M.D. [facility] of Gastroenterology

## 2020-08-03 NOTE — Progress Notes (Signed)
[facility] Progress Note   Subjective:   Feeling ok just dismayed by ongoing anemia and hospitalization.  No new symptoms.  No urinary symptoms.  I/Os 1.8/1.6 on home torsemide dose.  Cr stable at to 3.79, BUN 103.    Objective Vitals:   08/02/20 1645 08/03/20 0101 08/03/20 0135 08/03/20 0643  BP: (!) 161/61 (!) 154/88 137/74 (!) 147/74  Pulse: 60 (!) 59 (!) 58 (!) 57  Resp: 18 18 20 20   Temp: 98.5 F (36.9 C) 98.5 F (36.9 C) 97.8 F (36.6 C) 98.1 F (36.7 C)  TempSrc: Oral Oral Oral Oral  SpO2: 100% 100% 100% 100%  Weight:    119.4 kg  Height:       Physical Exam Gen: chronically ill but nontoxic  Eyes: anicteric ENT: MMM Neck: supple CV:  RRR, III/VI SEM Abd: soft, nontender, RLQ kidney transplant nontender Lungs: a few scattered rhonchi, normal WOB GU: no foley Extr:  minor ankle edema Neuro: nonfocal Skin: clotted av access noted LUE  Additional Objective Labs: Basic Metabolic Panel: Recent Labs  Lab 08/01/20 0731 08/02/20 1129 08/03/20 0637  NA 144 142 142  K 2.9* 3.4* 3.5  CL 104 102 103  CO2 23 23 22   GLUCOSE 96 129* 95  BUN 103* 101* 103*  CREATININE 3.81* 3.75* 3.79*  CALCIUM 6.0* 6.0* 6.1*  PHOS 6.5*  --  6.2*   Liver Function Tests: Recent Labs  Lab 07/31/20 1634 08/01/20 0731 08/03/20 0637  AST 15 15  --   ALT 15 15  --   ALKPHOS 356* 361*  --   BILITOT 0.6 1.4*  --   PROT 5.0* 5.3*  --   ALBUMIN 2.9* 3.1* 3.0*   No results for input(s): LIPASE, AMYLASE in the last 168 hours. CBC: Recent Labs  Lab 08/01/20 0601 08/01/20 1542 08/02/20 1129 08/02/20 2117 08/03/20 0637  WBC 6.9 6.8 6.6 6.3 6.4  NEUTROABS 5.7  --   --   --   --   HGB 7.4* 7.1* 6.5* 7.0* 7.8*  HCT 24.5* 23.0* 20.4* 22.4* 23.8*  MCV 94.6 95.4 91.9 92.2 90.5  PLT 129* 129* 122* 135* 129*   Blood Culture    Component Value Date/Time   SDES IN/OUT CATH URINE 07/04/2020 2229   SPECREQUEST  07/04/2020 2229    NONE Performed at [facility] [address] [place], [name] [address]    CULT MULTIPLE SPECIES PRESENT, SUGGEST RECOLLECTION (A) 07/04/2020 2229   REPTSTATUS 07/08/2020 FINAL 07/04/2020 2229    Cardiac Enzymes: No results for input(s): CKTOTAL, CKMB, CKMBINDEX, TROPONINI in the last 168 hours. CBG: No results for input(s): GLUCAP in the last 168 hours. Iron Studies:  Recent Labs    08/01/20 0731  IRON 225*  TIBC 304  FERRITIN 255   @[email or web address] @ Studies/Results: No results found. Medications: . heparin 1,300 Units/hr (08/02/20 2227)  . magnesium sulfate bolus IVPB 4 g (08/03/20 0850)   . sodium chloride   Intravenous Once  . amiodarone  200 mg Oral Daily  . atorvastatin  40 mg Oral QHS  . bisacodyl  10 mg Oral Q8H  . calcitRIOL  0.5 mcg Oral BID  . calcium-vitamin D  1 tablet Oral BID  . DULoxetine  20 mg Oral Daily  . isosorbide-hydrALAZINE  1.5 tablet Oral TID  . metoprolol succinate  25 mg Oral Daily  . mycophenolate  540 mg Oral BID  . pantoprazole  40 mg Oral Daily  . potassium chloride  40 mEq Oral Q6H  . predniSONE  5 mg Oral Q breakfast  . sevelamer carbonate  800 mg Oral TID WC  . sodium chloride flush  3 mL Intravenous Q12H  . tacrolimus  5 mg Oral BID  . torsemide  80 mg Oral Daily  . vitamin B-12  1,000 mcg Oral Daily    Assessment/Plan **Anemia: suspect multifactorial ? GI blood loss, hemoccult pending and certainly a component of hypoproliferative due to CKD.  Team eval for hemolysis as well.  Rec'd dose of ESA 07/15/20, repeated dose 1/26 and will need ongoing dosing out of hospital.  Hb stable in the 7s this AM after transfusion. Team considering involving hematology with new thrombocytopenia + anemia pending a bit more w/u.   **AKI on CKD:  Renal transplant with recent worsening renal function in setting of CHF and diuresis.  Cr ~3.5 most recently and stable at that level compared to recent hospital discharge but noted is BUN ~ 100 now.  She does not look hypovolemic and  I think the BUN ^ may be due to GI bleeding.  In light of recent massive volume overload we are continuing home diuretic for now so we don't get behind.  No uremic symptoms, volume or electrolyte issues warranting RRT currently.   **Hypocalcemia, secondary hyperPTH:  Last measured PTH in 800s; PTH resistance in context of CKD.  Doubled calcitriol to 0.5 BID. She's been started on BID oscal as well, increase to TID.  Trend.  Recheck PTH. Phos 6.5, started binder renvela 800 TIDAC   **Hypokalemia:  Repleted, K improved to 3.5  **s/p renal transplant:  Recent tac troughs low and dose adjusted, check level - pending from 1/26 PM (AM dose from 1/26 will be inaccurate on timing).   Cont current immunosuppression with tac, myfortic and pred.   **CHF:  Continue outpt meds including diuretics (torsemide 80 daily).  2g sodium/fluid restriction, daily weights.   **thrombocytopenia:  Modest in 120s, follow.  Smear, hemolysis labs pending.   **HTN:  Variable BP, monitor for now on current meds.    Will follow closely, page with issues.   Jannifer Hick MD 08/03/2020, 9:29 AM  [facility] Pager: [phone]

## 2020-08-03 NOTE — Consult Note (Signed)
[facility] CONSULT NOTE  Patient Care Team: [name], DO as PCP - General Dominic Pea, DO as Referring Physician (Internal Medicine)  CHIEF COMPLAINTS/PURPOSE OF CONSULTATION:  Anemia and thrombocytopenia.  ASSESSMENT & PLAN:  No problem-specific Assessment & Plan notes found for this encounter.  1. Anemia, normocytic normochromic Progressive for several months. No evidence of hemolysis. My personal review of the smear showed scattered schistocytes, some tear drop cells and some rouleax formation. Will add SPEP, K/L ratio, Immunoglobulin levels. No overt concern for microangiopathy on smear. I agree with proceeding with colonoscopy and endoscopy tomorrow. If no evidence of blood loss, we will proceed with BMB inpatient or outpatient  2. Thrombocytopenia, mild. No concern for microangiopathy at this time, Will continue to monitor and arraage for BMB if it doesn't improve.  Thank you for the consult.   HISTORY OF PRESENTING ILLNESS:   [name] 55 y.o. female is here because of abnormal labs.  Patient tells me that she was a rehab facility and she had some labs which showed hemoglobin of 5.  She was transferred to the hospital because of severe anemia.  She says she felt winded and very tired but otherwise has not noticed any changes. No bleeding in her stool or black colored stool. No blood in her urine. She hasn't noticed any headaches, falls or seizures. She complains of mild diarrhea and thinks its from iron supplementation She will be doing and endoscopy and colonoscopy tomorrow. Rest of the pertinent 10 point ROS reviewed and negative.  MEDICAL HISTORY:  Past Medical History:  Diagnosis Date  . Acute heart failure ([place]) 06/2020  . Blood transfusion without reported diagnosis   . CHF (congestive heart failure) ([place])   . 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 GERD  (gastroesophageal reflux disease)    medication induced  . Hypertension   . Renal failure    hx of  . 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 [place]  . CHOLECYSTECTOMY  2008  . COLONOSCOPY  10 years ago   Dr.Hung="normal"  . DG AV DIALYSIS  SHUNT ACCESS EXIST*L* OR    . KIDNEY TRANSPLANT  2011  . UMBILICAL HERNIA REPAIR  2009    SOCIAL HISTORY: Social History   Socioeconomic History  . Marital status: Single    Spouse name: Not on file  . Number of children: 2  . Years of education: 35  . Highest education level: Not on file  Occupational History    Employer: UNEMPLOYED  Tobacco Use  . Smoking status: Former Smoker    Packs/day: 0.50    Types: Cigarettes  . Smokeless tobacco: Never Used  . Tobacco comment: 10 some days   Vaping Use  . Vaping Use: Never used  Substance and Sexual Activity  . Alcohol use: Never    Alcohol/week: 0.0 standard drinks  . Drug use: Never  . Sexual activity: Not Currently  Other Topics Concern  . 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 Difficulty of Paying Living Expenses: Somewhat hard  Food Insecurity: Food Insecurity Present  . Worried About Charity fundraiser in the Last Year: Sometimes true  . Ran Out of Food in the Last Year:  Sometimes true  Transportation Needs: No Transportation Needs  . Lack of Transportation (Medical): No  . Lack of Transportation (Non-Medical): No  Physical Activity: Inactive  . Days of Exercise per Week: 0 days  . Minutes of Exercise per Session: 0 min  Stress: Not on file  Social Connections: Unknown  . Frequency of Communication with Friends and Family: More than three times a week  . Frequency of Social Gatherings with Friends and Family: More than three times a week  . Attends Religious Services: Not on file  . Active Member of Clubs or Organizations: Not on file  . Attends Archivist Meetings: Not on file  . Marital Status: Not on file  Intimate Partner Violence: Not on file    FAMILY HISTORY: Family History  Problem Relation Age of Onset  . Hypertension Mother   . Diabetes Mother   . Atrial fibrillation Father   . Hypertension Father   . Arthritis Father   . Heart failure Sister   . Hypertension Sister        all 4 sisters  . Hypertension Brother        both brothers  . Diabetes Brother   . Colon cancer Neg Hx   . Stomach cancer Neg Hx     ALLERGIES:  is allergic to infed [iron dextran], cefazolin, ancef [cefazolin sodium], lisinopril, and other.  MEDICATIONS:  Current Facility-Administered Medications  Medication Dose Route Frequency Provider Last Rate Last Admin  . 0.9 %  sodium chloride infusion (Manually program via Guardrails IV Fluids)   Intravenous Once Jose Persia, MD      . acetaminophen (TYLENOL) tablet 650 mg  650 mg Oral Q6H PRN Jose Persia, MD      . amiodarone (PACERONE) tablet 200 mg  200 mg Oral Daily Jose Persia, MD   200 mg at 08/03/20 0815  . atorvastatin (LIPITOR) tablet 40 mg  40 mg Oral QHS Jose Persia, MD   40 mg at 08/03/20 2148  . calcitRIOL (ROCALTROL) capsule 0.5 mcg  0.5 mcg Oral BID Justin Mend, MD   0.5 mcg at 08/03/20 2149  . calcium-vitamin D (OSCAL WITH D) 500-200 MG-UNIT per tablet 1 tablet  1  tablet Oral TID Cato Mulligan, MD   1 tablet at 08/03/20 2148  . DULoxetine (CYMBALTA) DR capsule 20 mg  20 mg Oral Daily [name], [name], DO   20 mg at 08/03/20 0815  . guaiFENesin-dextromethorphan (ROBITUSSIN DM) 100-10 MG/5ML syrup 5 mL  5 mL Oral Q4H PRN Jose Persia, MD      . heparin ADULT infusion 100 units/mL (25000 units/272mL)  1,300 Units/hr Intravenous Continuous Vena Rua, PA-C 13 mL/hr at 08/03/20 1621 1,300 Units/hr at 08/03/20 1621  . isosorbide-hydrALAZINE (BIDIL) 20-37.5 MG per tablet 1.5 tablet  1.5 tablet Oral TID Jose Persia, MD   1.5 tablet at 08/03/20 2148  . metoCLOPramide (REGLAN) injection 10  mg  10 mg Intravenous Once Vena Rua, PA-C      . metoprolol succinate (TOPROL-XL) 24 hr tablet 25 mg  25 mg Oral Daily Jose Persia, MD   25 mg at 08/03/20 0817  . mycophenolate (MYFORTIC) EC tablet 540 mg  540 mg Oral BID Jose Persia, MD   540 mg at 08/03/20 2149  . pantoprazole (PROTONIX) EC tablet 40 mg  40 mg Oral Daily Jose Persia, MD   40 mg at 08/03/20 0818  . peg 3350 powder (MOVIPREP) kit 100 g  0.5 kit Oral Once Thatcher, Dwayne A, RPH      . polyethylene glycol (MIRALAX / GLYCOLAX) packet 17 g  17 g Oral Daily PRN Jose Persia, MD      . predniSONE (DELTASONE) tablet 5 mg  5 mg Oral Q breakfast Jose Persia, MD   5 mg at 08/03/20 0818  . sevelamer carbonate (RENVELA) tablet 800 mg  800 mg Oral TID WC Justin Mend, MD   800 mg at 08/03/20 1637  . sodium chloride flush (NS) 0.9 % injection 3 mL  3 mL Intravenous Q12H Jose Persia, MD   3 mL at 08/03/20 0819  . tacrolimus (PROGRAF) capsule 5 mg  5 mg Oral BID Jose Persia, MD   5 mg at 08/03/20 2149  . [START ON 08/05/2020] torsemide (DEMADEX) tablet 80 mg  80 mg Oral Daily Cato Mulligan, MD      . vitamin B-12 (CYANOCOBALAMIN) tablet 1,000 mcg  1,000 mcg Oral Daily Jose Persia, MD   1,000 mcg at 08/03/20 0818     PHYSICAL EXAMINATION:  Vitals:   08/03/20 1343  08/03/20 1735  BP: (!) 138/59 (!) 154/72  Pulse: (!) 58 61  Resp: 19 17  Temp: 97.7 F (36.5 C) 97.7 F (36.5 C)  SpO2: 100% 99%   Filed Weights   07/31/20 1625 08/02/20 0455 08/03/20 0643  Weight: 234 lb (106.1 kg) 260 lb 12.9 oz (118.3 kg) 263 lb 3.7 oz (119.4 kg)    GENERAL:alert, no distress and comfortable SKIN: skin color, texture, turgor are normal, no rashes or significant lesions OROPHARYNX: no exudate, no erythema and lips, buccal mucosa, and tongue normal  NECK: supple, thyroid normal size, non-tender, without nodularity LYMPH:  no palpable lymphadenopathy in the cervical, axillary  LUNGS: clear to auscultation and percussion with normal breathing effort HEART: regular rate & rhythm and no murmurs and no lower extremity edema ABDOMEN:abdomen soft, non-tender and normal bowel sounds, no hepatosplenomegaly Musculoskeletal:no cyanosis of digits and no clubbing  PSYCH: alert & oriented x 3 with fluent speech NEURO: no focal motor/sensory deficits  LABORATORY DATA:  I have reviewed the data as listed Lab Results  Component Value Date   WBC 6.4 08/03/2020   HGB 7.8 (L) 08/03/2020   HCT 23.8 (L) 08/03/2020   MCV 90.5 08/03/2020   PLT 129 (L) 08/03/2020   RADIOGRAPHIC STUDIES: I have personally reviewed the radiological images as listed and agreed with the findings in the report. US Renal Transplant w/Doppler  Result Date: 07/06/2020 CLINICAL DATA:  Acute kidney injury on chronic kidney disease. EXAM: ULTRASOUND OF RENAL TRANSPLANT WITH RENAL DOPPLER ULTRASOUND TECHNIQUE: Ultrasound examination of the renal transplant was performed with gray-scale, color and duplex doppler evaluation. COMPARISON:  CT 03/17/2020 FINDINGS: Transplant kidney location: Right lower quadrant Transplant Kidney: Renal measurements: 12.6 x 6.9 x 5.6 cm = volume: 251mL. Normal in size and parenchymal echogenicity. No evidence of mass or hydronephrosis. A small amount of perinephric  fluid is  demonstrated. Color flow in the main renal artery:  Present Color flow in the main renal vein:  Present Duplex Doppler Evaluation: Main Renal Artery Velocity: 33 cm/sec Main Renal Artery Resistive Index: 0.8 Venous waveform in main renal vein:  Present Intrarenal resistive index in upper pole:  0.7 (normal 0.6-0.8; equivocal 0.8-0.9; abnormal >= 0.9) Intrarenal resistive index in lower pole: 0.9 (normal 0.6-0.8; equivocal 0.8-0.9; abnormal >= 0.9) Bladder: Normal for degree of bladder distention. Other findings:  None. IMPRESSION: 1. Normal ultrasound appearance of the right lower quadrant renal transplant with small amount of perinephric fluid. No evidence of hydronephrosis. 2. Resistive indices are borderline elevated at 0.8. Electronically Signed   By: Lucienne Capers M.D.   On: 07/06/2020 19:55   DG Chest Port 1 View  Result Date: 07/08/2020 CLINICAL DATA:  PICC line placement EXAM: PORTABLE CHEST 1 VIEW COMPARISON:  July 04, 2020 FINDINGS: Left PICC line is identified with distal tip in the superior vena cava. There is no pneumothorax. The heart size is enlarged. Diffuse hazy ground-glass opacity with increased interstitium is identified in both lungs. Dense consolidation of bilateral lung bases are noted. The pulmonary findings are significantly worse compared prior exam. IMPRESSION: 1. Left PICC line distal tip in the superior vena cava. No pneumothorax. 2. Diffuse hazy ground-glass opacity with increased pulmonary interstitium and dense consolidation of bilateral lungs. These findings are significantly worse compared prior exam. Electronically Signed   By: Abelardo Diesel M.D.   On: 07/08/2020 12:53   ECHOCARDIOGRAM COMPLETE  Result Date: 07/05/2020    ECHOCARDIOGRAM REPORT   Patient Name:   ASHMI BLAS Date of Exam: 07/05/2020 Medical Rec #:  [id number]       Height:       71.0 in Accession #:    [id number]      Weight:       235.0 lb Date of Birth:  16-Apr-1966       BSA:          2.258 m  Patient Age:    12 years        BP:           101/66 mmHg Patient Gender: F               HR:           70 bpm. Exam Location:  Inpatient Procedure: 2D Echo, Cardiac Doppler and Color Doppler Indications:    I50.23 Acute on chronic systolic (congestive) heart failure  History:        Patient has prior history of Echocardiogram examinations, most                 recent 03/16/2020. Risk Factors:Hypertension and Diabetes. GERD.                 Renal Transplant.  Sonographer:    Jonelle Sidle Dance Referring Phys: 52 [name]  Sonographer Comments: No subcostal window. IMPRESSIONS  1. Left ventricular ejection fraction, by estimation, is 25 to 30%. The left ventricle has severely decreased function. The left ventricle demonstrates global hypokinesis. The left ventricular internal cavity size was mildly dilated. There is severe left ventricular hypertrophy. Left ventricular diastolic parameters are consistent with Grade III diastolic dysfunction (restrictive). Elevated left atrial pressure.  2. Right ventricular systolic function is moderately reduced. The right ventricular size is mildly enlarged. There is mildly elevated pulmonary artery systolic pressure. The estimated right ventricular systolic pressure is 41.9 mmHg.  3. Left atrial  size was mildly dilated.  4. The mitral valve is normal in structure. Mild mitral valve regurgitation.  5. Tricuspid valve regurgitation is mild to moderate.  6. The aortic valve is tricuspid. There is moderate calcification of the aortic valve. Aortic valve regurgitation is moderate. Mild to moderate aortic valve sclerosis/calcification is present, without any evidence of aortic stenosis. FINDINGS  Left Ventricle: Left ventricular ejection fraction, by estimation, is 25 to 30%. The left ventricle has severely decreased function. The left ventricle demonstrates global hypokinesis. The left ventricular internal cavity size was mildly dilated. There is severe left ventricular hypertrophy.  Left ventricular diastolic parameters are consistent with Grade III diastolic dysfunction (restrictive). Elevated left atrial pressure. Right Ventricle: The right ventricular size is mildly enlarged. Right vetricular wall thickness was not well visualized. Right ventricular systolic function is moderately reduced. There is mildly elevated pulmonary artery systolic pressure. The tricuspid  regurgitant velocity is 2.61 m/s, and with an assumed right atrial pressure of 8 mmHg, the estimated right ventricular systolic pressure is 38.1 mmHg. Left Atrium: Left atrial size was mildly dilated. Right Atrium: Right atrial size was normal in size. Pericardium: Trivial pericardial effusion is present. Mitral Valve: The mitral valve is normal in structure. Mild mitral valve regurgitation. Tricuspid Valve: The tricuspid valve is normal in structure. Tricuspid valve regurgitation is mild to moderate. Aortic Valve: The aortic valve is tricuspid. There is moderate calcification of the aortic valve. Aortic valve regurgitation is moderate. Aortic regurgitation PHT measures 502 msec. Mild to moderate aortic valve sclerosis/calcification is present, without any evidence of aortic stenosis. Pulmonic Valve: The pulmonic valve was grossly normal. Pulmonic valve regurgitation is mild to moderate. Aorta: The aortic root and ascending aorta are structurally normal, with no evidence of dilitation. IAS/Shunts: The interatrial septum was not well visualized. Additional Comments: There is a small pleural effusion in the left lateral region.  LEFT VENTRICLE PLAX 2D LVIDd:         6.70 cm LVIDs:         5.20 cm LV PW:         1.50 cm LV IVS:        1.50 cm LVOT diam:     2.10 cm LV SV:         75 LV SV Index:   33 LVOT Area:     3.46 cm  RIGHT VENTRICLE RV Basal diam:  3.70 cm RV S prime:     7.51 cm/s TAPSE (M-mode): 1.5 cm LEFT ATRIUM              Index       RIGHT ATRIUM           Index LA diam:        6.20 cm  2.75 cm/m  RA Area:     21.20  cm LA Vol (A2C):   103.0 ml 45.61 ml/m RA Volume:   53.80 ml  23.82 ml/m LA Vol (A4C):   74.7 ml  33.08 ml/m LA Biplane Vol: 90.7 ml  40.16 ml/m  AORTIC VALVE LVOT Vmax:   121.95 cm/s LVOT Vmean:  75.600 cm/s LVOT VTI:    0.216 m AI PHT:      502 msec  AORTA Ao Root diam: 3.30 cm Ao Asc diam:  3.60 cm MITRAL VALVE               TRICUSPID VALVE MV Area (PHT): 3.77 cm    TR Peak grad:   27.2 mmHg MV Decel Time:  201 msec    TR Vmax:        261.00 cm/s MV E velocity: 98.50 cm/s MV A velocity: 48.50 cm/s  SHUNTS MV E/A ratio:  2.03        Systemic VTI:  0.22 m                            Systemic Diam: 2.10 cm Oswaldo Milian MD Electronically signed by Oswaldo Milian MD Signature Date/Time: 07/05/2020/2:37:26 PM    Final    VAS [place] UPPER EXTREMITY VENOUS DUPLEX  Result Date: 07/14/2020 UPPER VENOUS STUDY  Indications: Swelling/edema in RT extremity Other Indications: History of DVT RT. Limitations: Body habitus, poor ultrasound/tissue interface and bandages. Comparison Study: 03-14-2020 Prior RT upper extremity study results indicated                   axillary DVT of the RT upper extremity. Performing Technologist: Darlin Coco RDMS  Examination Guidelines: A complete evaluation includes B-mode imaging, spectral Doppler, color Doppler, and power Doppler as needed of all accessible portions of each vessel. Bilateral testing is considered an integral part of a complete examination. Limited examinations for reoccurring indications may be performed as noted.  Right Findings: +----------+------------+---------+-----------+--------------+-----------------+ RIGHT     CompressiblePhasicitySpontaneous  Properties       Summary      +----------+------------+---------+-----------+--------------+-----------------+ IJV           Full       Yes       Yes                  Fibrous stranding                                                               noted        +----------+------------+---------+-----------+--------------+-----------------+ Subclavian    Full       Yes       Yes                  Fibrous stranding                                                               noted       +----------+------------+---------+-----------+--------------+-----------------+ Axillary      None       No        No       Duplicated  Age Indeterminate                                              system-  anterior                                                                axillary v.                                                               thrombosed,                                                               posterior is                                                                 patent                      +----------+------------+---------+-----------+--------------+-----------------+ Brachial      Full       Yes       Yes                                    +----------+------------+---------+-----------+--------------+-----------------+ Radial        Full                                                        +----------+------------+---------+-----------+--------------+-----------------+ Ulnar         Full                                                        +----------+------------+---------+-----------+--------------+-----------------+ Cephalic    Partial      Yes       Yes                  Age Indeterminate +----------+------------+---------+-----------+--------------+-----------------+ Basilic       Full       Yes       Yes                                    +----------+------------+---------+-----------+--------------+-----------------+  Left Findings: +----------+------------+---------+-----------+----------+---------------------+ LEFT      CompressiblePhasicitySpontaneousProperties        Summary        +----------+------------+---------+-----------+----------+---------------------+ Subclavian  Unable to assess due                                                          to bandaging      +----------+------------+---------+-----------+----------+---------------------+  Summary:  Right: Findings consistent with age indeterminate deep vein thrombosis involving the right axillary vein. Findings consistent with age indeterminate superficial vein thrombosis involving the right cephalic vein. Findings essentially unchanged from previous examination.  Left: Unable to evaluate the subclavian vein.  *See table(s) above for measurements and observations.  Diagnosing physician: Monica Martinez MD Electronically signed by Monica Martinez MD on 07/14/2020 at 2:37:41 PM.    Final     All questions were answered. The patient knows to call the clinic with any problems, questions or concerns. I spent 50 minutes in the care of this patient including H and P, review of records, counseling and coordination of care.     Benay Pike, MD 08/03/2020 9:50 PM

## 2020-08-03 NOTE — Progress Notes (Signed)
CRITICAL VALUE ALERT  Critical Value:  Calcium 6.1  Date & Time Notied:  1/27 0719  Provider Notified: Paged yes  Orders Received/Actions taken: awaiting call back.

## 2020-08-03 NOTE — Progress Notes (Signed)
ANTICOAGULATION CONSULT NOTE - Initial Consult  Pharmacy Consult for heparin Indication: h/o VTE while apixaban on hold  Allergies  Allergen Reactions  . Infed [Iron Dextran] Other (See Comments)    Pt has chest pain  Chest pain  . Cefazolin Itching and Nausea And Vomiting  . Ancef [Cefazolin Sodium] Other (See Comments)    Tolerated rocephin 11/03/10 Reports ancef causes itching, sweating, vomiting (x2-2009?)  . Lisinopril Swelling  . Other     Pt may be allergic to another antibiotic daughter isn't sure and pt unable to verify.    Patient Measurements: Height: 5\' 11"  (180.3 cm) Weight: 119.4 kg (263 lb 3.7 oz) IBW/kg (Calculated) : 70.8 Heparin Dosing Weight: 93.8kg  Vital Signs: Temp: 97.7 F (36.5 C) (01/27 1343) Temp Source: Oral (01/27 1343) BP: 138/59 (01/27 1343) Pulse Rate: 58 (01/27 1343)  Labs: Recent Labs    08/01/20 0731 08/01/20 1542 08/02/20 1129 08/02/20 1332 08/02/20 2117 08/03/20 0637 08/03/20 1251  HGB  --    < > 6.5*  --  7.0* 7.8*  --   HCT  --    < > 20.4*  --  22.4* 23.8*  --   PLT  --    < > 122*  --  135* 129*  --   APTT  --   --   --  37*  --  74* 69*  LABPROT  --   --   --  19.2*  --   --  16.6*  INR  --   --   --  1.7*  --   --  1.4*  HEPARINUNFRC  --   --   --   --   --  1.50*  --   CREATININE 3.81*  --  3.75*  --   --  3.79*  --    < > = values in this interval not displayed.    Estimated Creatinine Clearance: 23.6 mL/min (A) (by C-G formula based on SCr of 3.79 mg/dL (H)).   Medical History: Past Medical History:  Diagnosis Date  . Acute heart failure ([place]) 06/2020  . Blood transfusion without reported diagnosis   . CHF (congestive heart failure) ([place])   . 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 GERD (gastroesophageal reflux disease)    medication induced  . Hypertension   . Renal failure    hx of  . 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 Patient to have endoscopy and apixaban has been held (last dose this AM ~8am). Pharmacy consulted to dose heparin in the interim while apixaban on hold.   APTT this AM 74, confirmation this afternoon 69. Continue current rate  Goal of Therapy:  Heparin level 0.3-0.7 units/ml  APTT 66-102 Monitor platelets by anticoagulation protocol: Yes   Plan:  Continue Heparin drip at 1300 units/hr HL and APTT in AM Will continue to get APTT until HL and APTT correlate.  Daily HL/APTT  Jiovani Mccammon A. Levada Dy, PharmD, BCPS, FNKF Clinical Pharmacist [place] Please utilize Amion for appropriate phone number to reach the unit pharmacist ([place])  08/03/2020,2:34 PM

## 2020-08-03 NOTE — Progress Notes (Addendum)
Subjective: Intermittent events: Patient received 2 units pRBCs. Clear liquid diet in preparation for colonoscopy and upper endoscopy tomorrow.   Patient seen just waking. Reports doing well, though just a bit tired. Denies lightheadedness and dizziness. Not having diarrhea. Denies bleeding.   Objective:  Vital signs in last 24 hours: Vitals:   08/02/20 1645 08/03/20 0101 08/03/20 0135 08/03/20 0643  BP: (!) 161/61 (!) 154/88 137/74 (!) 147/74  Pulse: 60 (!) 59 (!) 58 (!) 57  Resp: $Remo'18 18 20 20  'PnZQl$ Temp: 98.5 F (36.9 C) 98.5 F (36.9 C) 97.8 F (36.6 C) 98.1 F (36.7 C)  TempSrc: Oral Oral Oral Oral  SpO2: 100% 100% 100% 100%  Weight:    119.4 kg  Height:       Weight change: 1.1 kg  Intake/Output Summary (Last 24 hours) at 08/03/2020 1104 Last data filed at 08/03/2020 2376 Gross per 24 hour  Intake 1840.21 ml  Output 1600 ml  Net 240.21 ml   Physical Exam Constitutional:      General: She is not in acute distress.    Appearance: Normal appearance. She is obese. She is not ill-appearing or diaphoretic.     Comments: Pleasant lady seen lying in bed, NAD, [place] in place.   Cardiovascular:     Rate and Rhythm: Normal rate and regular rhythm.  Pulmonary:     Effort: Pulmonary effort is normal. No respiratory distress.  Abdominal:     Palpations: Abdomen is soft.     Tenderness: There is no abdominal tenderness.  Musculoskeletal:     Right lower leg: 1+ Pitting Edema present.     Left lower leg: 1+ Pitting Edema present.  Neurological:     Mental Status: She is alert.     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 Received 2 units of RBCs on admission, with appropriate rise in Hgb. Hgb subsequently dropped to 6.5, requiring 2 additional units. Scheduled for endoscopy and colonoscopy.    Anemia of chronic disease Thrombocytopenia Patient presented with severe anemia with Hgb 5.3. Baseline Hgb around 7-8 for the past year. During prior admission earlier this month, noted to have Hgb down to 6.8 requiring pRBCs with appropriate response. Started on weekly Aranesp by nephrology on 07/15/20. Patient received 2 units of RBCs, with appropriate rise in Hgb to 7.4.  Hgb subsequently dropped to 6.5, with GI consequently consulted. She is stable and denies symptoms of anemia at present. Retic count low, suggesting hypoproliferative, with thrombocytopenia, raises concern for bone marrow problem. Teardrop cells noted on blood smear per Tech review. Patient received an additional 2 units of pRBCs yesterday, Hgb remains stable at 7.8; patient clinically appropriate.   -Appreciate GI recs: holding iron, bowel prep tonight, plans for colonoscopy and endoscopy 12:00 tomorrrow -Hold daily torsemide tomorrow  -Hem/Onc consulted: f/u recs -Heparin per pharmacy -trend CBC -transfuse for Hgb <7 and/or active bleeding -continue B12  -holding ferrous sulfate   Renal failure s/p cadaver renal transplant in 2011  On immunosuppression with prograf, myfortic, and chronic prednisone CKD Stage V On prograf 5mg  BID, myfortic 540mg  BID, and prednisone 5mg  daily for immunosuppression. BUN remains elevated at 103; phosphorus trending down from admission 6.5 post starting binder yesterday.   -Nephrology following -continue home myfortic, prograf, and prednisone -Tacrolimus levels pending -monitor UOP -avoid nephrotoxic medications  Secondary hyperparathyroidism  from CKD Hypocalcemia Hypokalemia Calcium of 5.9 on admission. Corrected calcium ~6.7. Secondary hyperparathyroidism also thought to be the cause of her elevated alk phos (had negative multiple myeloma workup in the past). Initially given IV calcium gluconate 1g. Phosphorus noted to be newly elevated at 6.5; likely contributing. Phosphorus trending down following starting binder.  -Appreciate Nephrology  -800 Renvela TID  -increased calcitriol; calcium-vitamin D -trend BMP  Hypokalemia Hypomagnesemia  K 3.1 on admission. Chronically low. Improved with potassium and magnesium repletion yesterday.    -trend BMP -Repleting K and Mag again today  Chronic biventricular heart failure Atrial fibrillation (on eliquis) HTN HLD On 2L O2 at baseline Recent hospital admission for HF exacerbation s/p extensive diuresis (~60 lbs). ECHO during prior admission showing EF 25-30% with mild LV dilation, severe LVH, moderately decreased RV function/mild RV enlargement, moderate aortic insuffficiency. Patient does have 1-2+ pitting edema in bilateral lower extremities, but will need to be cautious with diuresis given CKD stage V. Most recent dry weight appears to be around 273 lbs.     -Holding eliquis 5mg  BID in light of potential GI bleed. Heparin per pharmacy. -continue torsemide 80mg  daily for HF -continue bidil 1.5 tabs TID, toprol XL 25mg  daily for HTN -continue amiodarone 200mg  daily  -continue lipitor 40mg  daily for HLD -strict I/Os, daily weights, fluid restriction -avoid ACEI, ARB, spironolactone, digoxin given CKD stage V  Hx of DVT Antithrombin III deficiency RUE doppler during prior admission showing age-indeterminate DVT of right axillary vein and age-indeterminate superficial vein thrombosis of right cephalic vein, unchanged from priors. ATIII levels found to be 57% of normal in 03/2020, thought to be due to nephrotic syndrome. -Holding eliquis 5mg  BID in light of potential GI bleed.  Heparin per pharmacy. -SCDs   GERD Continue home protonix 40mg  daily   Depression Home Cymbalta dose decreased to 20 mg daily in light of renal clearance.   LOS: 1 day   [name], Medical Student 08/03/2020, 11:04 AM

## 2020-08-03 NOTE — Progress Notes (Signed)
Full note to follow  I have reviewed the smear, noticed tear drop cell, rouleaux, anisocytosis, poikilocytosis but the degree of tear drop cells not concerning for myelofibrosis.  Bilirubin completely normal, scattered schistocytes, no confusion, no convincing evidence of microangiopathy either. I would recommend proceeding with endoscopy and colonoscopy to rule out blood loss. We will consider BMB if no evidence of blood loss noted on the scopes. For thrombocytopenia, we can consider referral to outpatient hematology. Consider adding B12 and RBC folate to morning labs.

## 2020-08-03 NOTE — Progress Notes (Signed)
[place] for heparin Indication: h/o VTE  Allergies  Allergen Reactions  . Infed [Iron Dextran] Other (See Comments)    Pt has chest pain  Chest pain  . Cefazolin Itching and Nausea And Vomiting  . Ancef [Cefazolin Sodium] Other (See Comments)    Tolerated rocephin 11/03/10 Reports ancef causes itching, sweating, vomiting (x2-2009?)  . Lisinopril Swelling  . Other     Pt may be allergic to another antibiotic daughter isn't sure and pt unable to verify.    Patient Measurements: Height: 5\' 11"  (180.3 cm) Weight: 119.4 kg (263 lb 3.7 oz) IBW/kg (Calculated) : 70.8 Heparin Dosing Weight: 93.8kg  Vital Signs: Temp: 98.1 F (36.7 C) (01/27 0643) Temp Source: Oral (01/27 0643) BP: 147/74 (01/27 0643) Pulse Rate: 57 (01/27 0643)  Labs: Recent Labs    08/01/20 0731 08/01/20 1542 08/02/20 1129 08/02/20 1332 08/02/20 2117 08/03/20 0637  HGB  --    < > 6.5*  --  7.0* 7.8*  HCT  --    < > 20.4*  --  22.4* 23.8*  PLT  --    < > 122*  --  135* 129*  APTT  --   --   --  37*  --  74*  LABPROT  --   --   --  19.2*  --   --   INR  --   --   --  1.7*  --   --   CREATININE 3.81*  --  3.75*  --   --  3.79*   < > = values in this interval not displayed.    Estimated Creatinine Clearance: 23.6 mL/min (A) (by C-G formula based on SCr of 3.79 mg/dL (H)).  Assessment: 54 y.o. female with h/o DVT, Eliquis on hold, for heparin.  Goal of Therapy:  Heparin level 0.3-0.7 units/ml  APTT 66-102 Monitor platelets by anticoagulation protocol: Yes   Plan:  Continue Heparin at current rate   Daley Gosse, [place] 08/03/2020,7:27 AM

## 2020-08-04 DIAGNOSIS — I480 Paroxysmal atrial fibrillation: Secondary | ICD-10-CM

## 2020-08-04 DIAGNOSIS — F32A Depression, unspecified: Secondary | ICD-10-CM | POA: Diagnosis not present

## 2020-08-04 DIAGNOSIS — N184 Chronic kidney disease, stage 4 (severe): Secondary | ICD-10-CM | POA: Diagnosis not present

## 2020-08-04 DIAGNOSIS — I5082 Biventricular heart failure: Secondary | ICD-10-CM | POA: Diagnosis not present

## 2020-08-04 DIAGNOSIS — D649 Anemia, unspecified: Secondary | ICD-10-CM | POA: Diagnosis not present

## 2020-08-04 DIAGNOSIS — Z7901 Long term (current) use of anticoagulants: Secondary | ICD-10-CM | POA: Diagnosis not present

## 2020-08-04 DIAGNOSIS — K625 Hemorrhage of anus and rectum: Secondary | ICD-10-CM | POA: Diagnosis not present

## 2020-08-04 LAB — TYPE AND SCREEN
ABO/RH(D): A POS
Antibody Screen: NEGATIVE
Unit division: 0
Unit division: 0
Unit division: 0
Unit division: 0

## 2020-08-04 LAB — BPAM RBC
Blood Product Expiration Date: 202201262359
Blood Product Expiration Date: 202202102359
Blood Product Expiration Date: 202202102359
Blood Product Expiration Date: 202202202359
ISSUE DATE / TIME: 202201250146
ISSUE DATE / TIME: 202201250358
ISSUE DATE / TIME: 202201261426
ISSUE DATE / TIME: 202201270104
Unit Type and Rh: 600
Unit Type and Rh: 6200
Unit Type and Rh: 6200
Unit Type and Rh: 6200

## 2020-08-04 LAB — APTT
aPTT: 70 seconds — ABNORMAL HIGH (ref 24–36)
aPTT: 59 seconds — ABNORMAL HIGH (ref 24–36)

## 2020-08-04 LAB — CBC
HCT: 24.2 % — ABNORMAL LOW (ref 36.0–46.0)
Hemoglobin: 7.9 g/dL — ABNORMAL LOW (ref 12.0–15.0)
MCH: 30 pg (ref 26.0–34.0)
MCHC: 32.6 g/dL (ref 30.0–36.0)
MCV: 92 fL (ref 80.0–100.0)
Platelets: 131 10*3/uL — ABNORMAL LOW (ref 150–400)
RBC: 2.63 MIL/uL — ABNORMAL LOW (ref 3.87–5.11)
RDW: 17.7 % — ABNORMAL HIGH (ref 11.5–15.5)
WBC: 6 10*3/uL (ref 4.0–10.5)
nRBC: 0 % (ref 0.0–0.2)

## 2020-08-04 LAB — RENAL FUNCTION PANEL
Albumin: 3.1 g/dL — ABNORMAL LOW (ref 3.5–5.0)
Anion gap: 17 — ABNORMAL HIGH (ref 5–15)
BUN: 99 mg/dL — ABNORMAL HIGH (ref 6–20)
CO2: 23 mmol/L (ref 22–32)
Calcium: 6.8 mg/dL — ABNORMAL LOW (ref 8.9–10.3)
Chloride: 104 mmol/L (ref 98–111)
Creatinine, Ser: 3.79 mg/dL — ABNORMAL HIGH (ref 0.44–1.00)
GFR, Estimated: 13 mL/min — ABNORMAL LOW (ref >60)
Glucose, Bld: 91 mg/dL (ref 70–99)
Phosphorus: 6.3 mg/dL — ABNORMAL HIGH (ref 2.5–4.6)
Potassium: 3.7 mmol/L (ref 3.5–5.1)
Sodium: 144 mmol/L (ref 135–145)

## 2020-08-04 LAB — PATHOLOGIST SMEAR REVIEW

## 2020-08-04 LAB — HEPARIN LEVEL (UNFRACTIONATED): Heparin Unfractionated: 0.69 IU/mL (ref 0.30–0.70)

## 2020-08-04 LAB — MAGNESIUM: Magnesium: 1.9 mg/dL (ref 1.7–2.4)

## 2020-08-04 LAB — TACROLIMUS LEVEL: Tacrolimus (FK506) - LabCorp: 2.2 ng/mL (ref 2.0–20.0)

## 2020-08-04 LAB — VITAMIN B12: Vitamin B-12: 835 pg/mL (ref 180–914)

## 2020-08-04 LAB — PARATHYROID HORMONE, INTACT (NO CA): PTH: 1548 pg/mL — ABNORMAL HIGH (ref 15–65)

## 2020-08-04 MED ORDER — TORSEMIDE 20 MG PO TABS
80.0000 mg | ORAL_TABLET | Freq: Every day | ORAL | Status: DC
  Administered 2020-08-04: 80 mg via ORAL
  Filled 2020-08-04 (×2): qty 4

## 2020-08-04 MED ORDER — HEPARIN (PORCINE) 25000 UT/250ML-% IV SOLN: 1300.0000 [IU]/h | INTRAVENOUS | Status: DC

## 2020-08-04 MED ORDER — PEG-KCL-NACL-NASULF-NA ASC-C 100 G PO SOLR
0.5000 | Freq: Once | ORAL | Status: AC
  Administered 2020-08-04: 100 g via ORAL
  Filled 2020-08-04: qty 1

## 2020-08-04 MED ORDER — PEG-KCL-NACL-NASULF-NA ASC-C 100 G PO SOLR: 1.0000 | Freq: Once | ORAL | Status: DC

## 2020-08-04 MED ORDER — METOCLOPRAMIDE HCL 5 MG/ML IJ SOLN
10.0000 mg | Freq: Four times a day (QID) | INTRAMUSCULAR | Status: AC
  Administered 2020-08-04 – 2020-08-05 (×3): 10 mg via INTRAVENOUS
  Filled 2020-08-04 (×3): qty 2

## 2020-08-04 MED ORDER — PEG-KCL-NACL-NASULF-NA ASC-C 100 G PO SOLR
0.5000 | Freq: Once | ORAL | Status: AC
  Administered 2020-08-04: 100 g via ORAL

## 2020-08-04 MED ORDER — HEPARIN (PORCINE) 25000 UT/250ML-% IV SOLN
1400.0000 [IU]/h | INTRAVENOUS | Status: AC
  Administered 2020-08-04: 1300 [IU]/h via INTRAVENOUS
  Filled 2020-08-04: qty 250

## 2020-08-04 NOTE — Progress Notes (Signed)
Brought bowel prep to pt and woke her up reminding her that she would need to finish off her last bottle as well as start on the new one. Pt stated "I've done the best that I can with those. I can't drink no more", and went back to sleep.

## 2020-08-04 NOTE — Progress Notes (Signed)
ANTICOAGULATION CONSULT NOTE - Initial Consult  Pharmacy Consult for heparin Indication: h/o VTE while apixaban on hold  Allergies  Allergen Reactions  . Infed [Iron Dextran] Other (See Comments)    Pt has chest pain  Chest pain  . Cefazolin Itching and Nausea And Vomiting  . Ancef [Cefazolin Sodium] Other (See Comments)    Tolerated rocephin 11/03/10 Reports ancef causes itching, sweating, vomiting (x2-2009?)  . Lisinopril Swelling  . Other     Pt may be allergic to another antibiotic daughter isn't sure and pt unable to verify.    Patient Measurements: Height: 5\' 11"  (180.3 cm) Weight: 118.7 kg (261 lb 11 oz) IBW/kg (Calculated) : 70.8 Heparin Dosing Weight: 93.8kg  Vital Signs: Temp: 97.9 F (36.6 C) (01/28 0620) Temp Source: Oral (01/28 0620) BP: 162/80 (01/28 0620) Pulse Rate: 59 (01/28 0620)  Labs: Recent Labs    08/02/20 1129 08/02/20 1129 08/02/20 1332 08/02/20 2117 08/03/20 0637 08/03/20 1251 08/04/20 0719  HGB 6.5*  --   --  7.0* 7.8*  --  7.9*  HCT 20.4*  --   --  22.4* 23.8*  --  24.2*  PLT 122*  --   --  135* 129*  --  131*  APTT  --    < > 37*  --  74* 69* 70*  LABPROT  --   --  19.2*  --   --  16.6*  --   INR  --   --  1.7*  --   --  1.4*  --   HEPARINUNFRC  --   --   --   --  1.50*  --  0.69  CREATININE 3.75*  --   --   --  3.79*  --  3.79*   < > = values in this interval not displayed.    Estimated Creatinine Clearance: 23.5 mL/min (A) (by C-G formula based on SCr of 3.79 mg/dL (H)).   Medical History: Past Medical History:  Diagnosis Date  . Acute heart failure ([place]) 06/2020  . Blood transfusion without reported diagnosis   . CHF (congestive heart failure) ([place])   . 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 GERD (gastroesophageal reflux disease)    medication induced  . Hypertension   . Renal failure    hx of  . 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 Patient to have endoscopy and apixaban has been held (last dose this AM ~8am). Pharmacy consulted to dose heparin in the interim while apixaban on hold.   APTT this AM 70, HL 0.69 (not correlating). Heparin held this AM for EGD/colonoscopy, but this has been rescheduled for 1/29. Will restart heparin drip per GI at last rate and stop heparin on 1/29 at 0500.   Goal of Therapy:  Heparin level 0.3-0.7 units/ml  APTT 66-102 Monitor platelets by anticoagulation protocol: Yes   Plan:  Continue Heparin drip at 1300 units/hr HL and APTT in AM Will continue to get APTT until HL and APTT correlate.  Daily HL/APTT  Hettie Roselli A. Levada Dy, PharmD, BCPS, [place]  Please utilize Amion for appropriate phone number to reach the unit pharmacist ([place])   08/04/2020,11:25 AM

## 2020-08-04 NOTE — Progress Notes (Addendum)
Daily Rounding Note  08/04/2020, 9:09 AM  LOS: 2 days   SUBJECTIVE:   Chief complaint:   Anemia, black stools (on po iron).     Drank only 1 liter of prep and then said she'd had enough.   No BM's on shift starting 7 AM today.  Last stool overnight was black, watery.  No vomiting or abd pain.    OBJECTIVE:         Vital signs in last 24 hours:    Temp:  [97.7 F (36.5 C)-98.3 F (36.8 C)] 97.9 F (36.6 C) (01/28 0620) Pulse Rate:  [58-61] 59 (01/28 0620) Resp:  [17-19] 18 (01/28 0620) BP: (138-162)/(59-80) 162/80 (01/28 0620) SpO2:  [99 %-100 %] 100 % (01/28 0620) Weight:  [118.7 kg] 118.7 kg (01/28 0620) Last BM Date: 08/01/20 Filed Weights   08/02/20 0455 08/03/20 0643 08/04/20 5465  Weight: 118.3 kg 119.4 kg 118.7 kg   General: looks comfortable, not acutely ill   Heart: RRR Chest: clear bil.  No cough or labored breathing Abdomen: soft,  NT, ND.  Active BS Extremities: no CCE Neuro/Psych:  Oriented x 3.  Fluid speech.    Intake/Output from previous day: 01/27 0701 - 01/28 0700 In: 480 [P.O.:480] Out: 1200 [Urine:1200]  Intake/Output this shift: No intake/output data recorded.  Lab Results: Recent Labs    08/02/20 2117 08/03/20 0637 08/04/20 0719  WBC 6.3 6.4 6.0  HGB 7.0* 7.8* 7.9*  HCT 22.4* 23.8* 24.2*  PLT 135* 129* 131*   BMET Recent Labs    08/02/20 1129 08/03/20 0637  NA 142 142  K 3.4* 3.5  CL 102 103  CO2 23 22  GLUCOSE 129* 95  BUN 101* 103*  CREATININE 3.75* 3.79*  CALCIUM 6.0* 6.1*   LFT Recent Labs    08/03/20 0637 08/03/20 1712  PROT  --  5.3*  ALBUMIN 3.0* 3.1*  AST  --  15  ALT  --  14  ALKPHOS  --  397*  BILITOT  --  0.8  BILIDIR  --  0.2  IBILI  --  0.6   PT/INR Recent Labs    08/02/20 1332 08/03/20 1251  LABPROT 19.2* 16.6*  INR 1.7* 1.4*   Hepatitis Panel No results for input(s): HEPBSAG, HCVAB, HEPAIGM, HEPBIGM in the last 72  hours.  Studies/Results: No results found.   Scheduled Meds: . sodium chloride   Intravenous Once  . amiodarone  200 mg Oral Daily  . atorvastatin  40 mg Oral QHS  . calcitRIOL  0.5 mcg Oral BID  . calcium-vitamin D  1 tablet Oral TID  . DULoxetine  20 mg Oral Daily  . isosorbide-hydrALAZINE  1.5 tablet Oral TID  . metoprolol succinate  25 mg Oral Daily  . mycophenolate  540 mg Oral BID  . pantoprazole  40 mg Oral Daily  . predniSONE  5 mg Oral Q breakfast  . sevelamer carbonate  800 mg Oral TID WC  . sodium chloride flush  3 mL Intravenous Q12H  . tacrolimus  5 mg Oral BID  . [START ON 08/05/2020] torsemide  80 mg Oral Daily  . vitamin B-12  1,000 mcg Oral Daily   Continuous Infusions: PRN Meds:.acetaminophen, guaiFENesin-dextromethorphan, polyethylene glycol   ASSESMENT:   *   Acute on chronic anemia.   4 PRBCs.  Hgb  5.3 >> 7.9.    *   Incomplete bowel prep.  Adenomatous colon polyps 2017, overdue surveillance  colonoscopy from 04/2020.    *  Thrombocytopenia, improved.    *   Chronic E.liquis for DVT, on hold.  1/26 AM was last dose.  IV Heparin in place, held as of 0700 for colon/EGD  *   CKD 5 in renal transplant pt.     PLAN   *   ? Pursue EGD alone today and not colonoscopy vs further bowel prep today and reschedule both for tmrw. Address restart Heparin once above decision finalized.  SEE BELOW    [name]  08/04/2020, 9:09 AM Phone [phone]  GI ATTENDING  Interval history data reviewed.  Patient seen and examined.  Agree with interval progress note.  Unfortunately she did not complete her bowel prep.  She will be rescheduled (after adequate bowel preparation) for colonoscopy and upper endoscopy tomorrow to evaluate acute on chronic anemia, dark heme positive stools, and a history of multiple adenomatous colon polyps (surveillance) 2017.  She has renal insufficiency, but her Eliquis washout should be adequate at the time of her procedures as she  will have been off of the drug for 3 days.  Her examinations will be performed tomorrow by Dr. Carol Ada.  Docia Chuck. Geri Seminole., M.D. [facility] Division of Gastroenterology

## 2020-08-04 NOTE — Progress Notes (Signed)
ANTICOAGULATION CONSULT NOTE - Initial Consult  Pharmacy Consult for heparin Indication: h/o VTE while apixaban on hold  Allergies  Allergen Reactions  . Infed [Iron Dextran] Other (See Comments)    Pt has chest pain  Chest pain  . Cefazolin Itching and Nausea And Vomiting  . Ancef [Cefazolin Sodium] Other (See Comments)    Tolerated rocephin 11/03/10 Reports ancef causes itching, sweating, vomiting (x2-2009?)  . Lisinopril Swelling  . Other     Pt may be allergic to another antibiotic daughter isn't sure and pt unable to verify.    Patient Measurements: Height: 5\' 11"  (180.3 cm) Weight: 118.7 kg (261 lb 11 oz) IBW/kg (Calculated) : 70.8 Heparin Dosing Weight: 93.8kg  Vital Signs: Temp: 98.2 F (36.8 C) (01/28 1651) Temp Source: Oral (01/28 1651) BP: 154/69 (01/28 1651) Pulse Rate: 62 (01/28 1651)  Labs: Recent Labs    08/02/20 1129 08/02/20 1129 08/02/20 1332 08/02/20 2117 08/03/20 0637 08/03/20 1251 08/04/20 0719 08/04/20 1918  HGB 6.5*  --   --  7.0* 7.8*  --  7.9*  --   HCT 20.4*  --   --  22.4* 23.8*  --  24.2*  --   PLT 122*  --   --  135* 129*  --  131*  --   APTT  --    < > 37*  --  74* 69* 70* 59*  LABPROT  --   --  19.2*  --   --  16.6*  --   --   INR  --   --  1.7*  --   --  1.4*  --   --   HEPARINUNFRC  --   --   --   --  1.50*  --  0.69  --   CREATININE 3.75*  --   --   --  3.79*  --  3.79*  --    < > = values in this interval not displayed.    Estimated Creatinine Clearance: 23.5 mL/min (A) (by C-G formula based on SCr of 3.79 mg/dL (H)).   Medical History: Past Medical History:  Diagnosis Date  . Acute heart failure ([place]) 06/2020  . Blood transfusion without reported diagnosis   . CHF (congestive heart failure) ([place])   . 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 GERD (gastroesophageal reflux disease)    medication induced  . Hypertension   . Renal failure    hx of  . 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 Patient to have endoscopy and apixaban has been held (last dose this AM ~8am). Pharmacy consulted to dose heparin in the interim while apixaban on hold.   APTT this AM 70, HL 0.69 (not correlating). Heparin held this AM for EGD/colonoscopy, but this has been rescheduled for 1/29. Will restart heparin drip per GI at last rate and stop heparin on 1/29 at 0500.   APTT came back slightly low this PM. We will increase the rate slightly with the stop time of 0500.   Goal of Therapy:  Heparin level 0.3-0.7 units/ml  APTT 66-102  Monitor platelets by anticoagulation protocol: Yes   Plan:  Increase Heparin drip to 1300 units/hr Stop hep at 0500 F/u with resume heparin post EGD  Onnie Boer, PharmD, BCIDP, AAHIVP, CPP Infectious Disease Pharmacist 08/04/2020 8:39 PM

## 2020-08-04 NOTE — Progress Notes (Addendum)
Subjective: Intermittent events: Patient was unable to complete bowel prep.   Patient seen eating breakfast. Reports doing well. GI came by earlier. Patient is aware of plans on likely colonoscopy and endoscopy tomorrow. Denies abdominal pain and bleeding. Reports legs feel a bit more swollen today.    Objective:  Vital signs in last 24 hours: Vitals:   08/03/20 1343 08/03/20 1735 08/04/20 0040 08/04/20 0620  BP: (!) 138/59 (!) 154/72 (!) 144/75 (!) 162/80  Pulse: (!) 58 61 (!) 59 (!) 59  Resp: $Remo'19 17 19 18  'nQFll$ Temp: 97.7 F (36.5 C) 97.7 F (36.5 C) 98.3 F (36.8 C) 97.9 F (36.6 C)  TempSrc: Oral Oral Oral Oral  SpO2: 100% 99% 100% 100%  Weight:    118.7 kg  Height:       Weight change: -0.7 kg  Intake/Output Summary (Last 24 hours) at 08/04/2020 1150 Last data filed at 08/04/2020 0654 Gross per 24 hour  Intake 240 ml  Output 1200 ml  Net -960 ml   Physical Exam Constitutional:      General: She is not in acute distress.    Appearance: Normal appearance. She is obese. She is not ill-appearing or toxic-appearing.     Comments: Pleasant lady seen lying in bed, eating. NAD.   Cardiovascular:     Rate and Rhythm: Normal rate and regular rhythm.  Pulmonary:     Effort: Pulmonary effort is normal. No respiratory distress.  Abdominal:     Palpations: Abdomen is soft.     Tenderness: There is no abdominal tenderness.  Musculoskeletal:     Right lower leg: 1+ Pitting Edema present.     Left lower leg: 1+ Pitting Edema present.  Neurological:     Mental Status: She is alert.  Psychiatric:        Mood and Affect: Mood normal.        Behavior: Behavior normal.     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 Received 2 units of RBCs on admission, with appropriate rise in Hgb. Hgb subsequently dropped to 6.5, requiring 2 additional units. Scheduled for endoscopy and colonoscopy tomorrow (unable to complete bowel prep overnight).   Anemia of chronic disease Thrombocytopenia Patient presented with severe anemia with Hgb 5.3. Baseline Hgb around 7-8 for the past year. During prior admission earlier this month, noted to have Hgb down to 6.8 requiring pRBCs with appropriate response. Started on weekly Aranesp by nephrology on 07/15/20. Patient received 2 units of RBCs, with appropriate rise in Hgb to 7.4.  Hgb subsequently dropped to 6.5, with GI consequently consulted. She is stable and denies symptoms of anemia at present. Retic count low, suggesting hypoproliferative, with thrombocytopenia, raises concern for bone marrow problem. Teardrop cells noted on blood smear per Tech review. Patient s/p 4 pRBCs units over the course of this hospitalization, Hgb remains stable; patient clinically appropriate. Evaluated by Hemo/onc yesterday. She was unable to complete bowel prep  overnight.   -Appreciate GI recs: finish bowel prep tonight, plans for colonoscopy and endoscopy tomorrrow -Give torsemide dose today as colonoscopy will be tomorrow; hold tomorrow -Hem/Onc consulted: BMB in-patient or out-patient if endoscopy unremarkable.  -Heparin per pharmacy -trend CBC -transfuse for Hgb <7 and/or active bleeding -continue B12  -holding ferrous sulfate  Renal failure s/p cadaver renal transplant in 2011 On immunosuppression with prograf, myfortic, and chronic prednisone CKD Stage V On prograf 5mg  BID,  myfortic 540mg  BID, and prednisone 5mg  daily for immunosuppression. BUN remains elevated at 103; phosphorus trending down from admission 6.5 post starting binder.   -Nephrology following -continue home myfortic, prograf, and prednisone -Tacrolimus levels pending -monitor UOP -avoid nephrotoxic medications   Secondary hyperparathyroidism from CKD Hypocalcemia Hypokalemia Calcium of 5.9 on admission. Corrected calcium ~6.7. Secondary hyperparathyroidism also thought to be the cause of her elevated alk phos (had negative multiple myeloma workup in the past). Initially given IV calcium gluconate 1g. Elevated phosphorus likely contributing, which has been somewhat down trending following starting binder. Calcium levels appropriately rising with supplementation.   -Appreciate Nephrology  -800 Renvela TID  -increased calcitriol; calcium-vitamin D increased to TID -trend BMP   Hypokalemia Hypomagnesemia  K 3.1 on admission. Chronically low. Improved with repletion.    -trend BMP -Replete if required   Chronic biventricular heart failure Atrial fibrillation (on eliquis) HTN HLD On 2L O2 at baseline Recent hospital admission for HF exacerbation s/p extensive diuresis (~60 lbs). ECHO during prior admission showing EF 25-30% with mild LV dilation, severe LVH, moderately decreased RV function/mild RV enlargement, moderate aortic insuffficiency. Patient does have 1-2+ pitting edema in bilateral lower extremities, but will need to be cautious with diuresis given CKD stage V. Most recent dry weight appears to be around 273 lbs. Lower extremity a bit more edematous today.    -Holding eliquis 5mg  BID in light of potential GI bleed. Heparin per pharmacy. -continue torsemide 80mg  daily for HF -continue bidil 1.5 tabs TID, toprol XL 25mg  daily for HTN -continue amiodarone 200mg  daily  -continue lipitor 40mg  daily for HLD -strict I/Os, daily weights, fluid restriction -avoid ACEI, ARB,  spironolactone, digoxin given CKD stage V   Hx of DVT Antithrombin III deficiency RUE doppler during prior admission showing age-indeterminate DVT of right axillary vein and age-indeterminate superficial vein thrombosis of right cephalic vein, unchanged from priors. ATIII levels found to be 57% of normal in 03/2020, thought to be due to nephrotic syndrome. -Holding eliquis 5mg  BID in light of potential GI bleed. Heparin per pharmacy. -SCDs    GERD Continue home protonix 40mg  daily   Depression Home Cymbalta dose decreased to 20 mg daily in light of renal clearance.    LOS: 2 days   Azell Der, Medical Student 08/04/2020, 11:50 AM

## 2020-08-04 NOTE — H&P (View-Only) (Signed)
Daily Rounding Note  08/04/2020, 9:09 AM  LOS: 2 days   SUBJECTIVE:   Chief complaint:   Anemia, black stools (on po iron).     Drank only 1 liter of prep and then said she'd had enough.   No BM's on shift starting 7 AM today.  Last stool overnight was black, watery.  No vomiting or abd pain.    OBJECTIVE:         Vital signs in last 24 hours:    Temp:  [97.7 F (36.5 C)-98.3 F (36.8 C)] 97.9 F (36.6 C) (01/28 0620) Pulse Rate:  [58-61] 59 (01/28 0620) Resp:  [17-19] 18 (01/28 0620) BP: (138-162)/(59-80) 162/80 (01/28 0620) SpO2:  [99 %-100 %] 100 % (01/28 0620) Weight:  [118.7 kg] 118.7 kg (01/28 0620) Last BM Date: 08/01/20 Filed Weights   08/02/20 0455 08/03/20 0643 08/04/20 2355  Weight: 118.3 kg 119.4 kg 118.7 kg   General: looks comfortable, not acutely ill   Heart: RRR Chest: clear bil.  No cough or labored breathing Abdomen: soft,  NT, ND.  Active BS Extremities: no CCE Neuro/Psych:  Oriented x 3.  Fluid speech.    Intake/Output from previous day: 01/27 0701 - 01/28 0700 In: 480 [P.O.:480] Out: 1200 [Urine:1200]  Intake/Output this shift: No intake/output data recorded.  Lab Results: Recent Labs    08/02/20 2117 08/03/20 0637 08/04/20 0719  WBC 6.3 6.4 6.0  HGB 7.0* 7.8* 7.9*  HCT 22.4* 23.8* 24.2*  PLT 135* 129* 131*   BMET Recent Labs    08/02/20 1129 08/03/20 0637  NA 142 142  K 3.4* 3.5  CL 102 103  CO2 23 22  GLUCOSE 129* 95  BUN 101* 103*  CREATININE 3.75* 3.79*  CALCIUM 6.0* 6.1*   LFT Recent Labs    08/03/20 0637 08/03/20 1712  PROT  --  5.3*  ALBUMIN 3.0* 3.1*  AST  --  15  ALT  --  14  ALKPHOS  --  397*  BILITOT  --  0.8  BILIDIR  --  0.2  IBILI  --  0.6   PT/INR Recent Labs    08/02/20 1332 08/03/20 1251  LABPROT 19.2* 16.6*  INR 1.7* 1.4*   Hepatitis Panel No results for input(s): HEPBSAG, HCVAB, HEPAIGM, HEPBIGM in the last 72  hours.  Studies/Results: No results found.   Scheduled Meds: . sodium chloride   Intravenous Once  . amiodarone  200 mg Oral Daily  . atorvastatin  40 mg Oral QHS  . calcitRIOL  0.5 mcg Oral BID  . calcium-vitamin D  1 tablet Oral TID  . DULoxetine  20 mg Oral Daily  . isosorbide-hydrALAZINE  1.5 tablet Oral TID  . metoprolol succinate  25 mg Oral Daily  . mycophenolate  540 mg Oral BID  . pantoprazole  40 mg Oral Daily  . predniSONE  5 mg Oral Q breakfast  . sevelamer carbonate  800 mg Oral TID WC  . sodium chloride flush  3 mL Intravenous Q12H  . tacrolimus  5 mg Oral BID  . [START ON 08/05/2020] torsemide  80 mg Oral Daily  . vitamin B-12  1,000 mcg Oral Daily   Continuous Infusions: PRN Meds:.acetaminophen, guaiFENesin-dextromethorphan, polyethylene glycol   ASSESMENT:   *   Acute on chronic anemia.   4 PRBCs.  Hgb  5.3 >> 7.9.    *   Incomplete bowel prep.  Adenomatous colon polyps 2017, overdue surveillance  colonoscopy from 04/2020.    *  Thrombocytopenia, improved.    *   Chronic E.liquis for DVT, on hold.  1/26 AM was last dose.  IV Heparin in place, held as of 0700 for colon/EGD  *   CKD 5 in renal transplant pt.     PLAN   *   ? Pursue EGD alone today and not colonoscopy vs further bowel prep today and reschedule both for tmrw. Address restart Heparin once above decision finalized.  SEE BELOW    Azucena Freed  08/04/2020, 9:09 AM Phone [phone]  GI ATTENDING  Interval history data reviewed.  Patient seen and examined.  Agree with interval progress note.  Unfortunately she did not complete her bowel prep.  She will be rescheduled (after adequate bowel preparation) for colonoscopy and upper endoscopy tomorrow to evaluate acute on chronic anemia, dark heme positive stools, and a history of multiple adenomatous colon polyps (surveillance) 2017.  She has renal insufficiency, but her Eliquis washout should be adequate at the time of her procedures as she  will have been off of the drug for 3 days.  Her examinations will be performed tomorrow by Dr. Carol Ada.  Docia Chuck. Geri Seminole., M.D. [facility] Division of Gastroenterology

## 2020-08-04 NOTE — Progress Notes (Signed)
[name]   DOB:09-Jan-1966   QP#:[id number]   ZLD#:[id number]  ASSESSMENT & PLAN:  No problem-specific Assessment & Plan notes found for this encounter.  1. Anemia, normocytic normochromic Progressive for several months. Labs today stable counts. Endoscopy and colonoscopy today If no evidence of blood loss to explain anemia, we can consider IR guided BMB  2. Thrombocytopenia, mild. No concern for microangiopathy at this time, Stable compared to yesterday.  3. CKD stage V Followed by nephrology  Subjective:   She is feeling well, getting ready to do her endoscopy and colonoscopy today. No bleeding reported. No fevers or no chills No change in breathing  Objective:  Vitals:   08/04/20 0800 08/04/20 1202  BP:  119/71  Pulse:  63  Resp: 20 18  Temp:  97.6 F (36.4 C)  SpO2: 100% 100%    Body mass index is 36.5 kg/m.  Intake/Output Summary (Last 24 hours) at 08/04/2020 1637 Last data filed at 08/04/2020 0654 Gross per 24 hour  Intake 240 ml  Output 1200 ml  Net -960 ml     Sclerae unicteric  Oropharynx clear  No peripheral adenopathy  Lungs clear -- no rales or rhonchi  Heart regular rate and rhythm  Abdomen benign  MSK no focal spinal tenderness, no peripheral edema  Neuro nonfocal             1+ BLE edema    CBG (last 3)  No results for input(s): GLUCAP in the last 72 hours.   Labs:  Lab Results  Component Value Date   WBC 6.0 08/04/2020   HGB 7.9 (L) 08/04/2020   HCT 24.2 (L) 08/04/2020   MCV 92.0 08/04/2020   PLT 131 (L) 08/04/2020   NEUTROABS 5.7 08/01/2020   Reviewed labs from today Anemia is stable Thrombocytopenia improved. CKD stable compared to yesterday. B 12 levels normal MM labs pending  Urine Studies No results for input(s): UHGB, CRYS in the last 72 hours.  Invalid input(s): UACOL, UAPR, USPG, UPH, UTP, UGL, UKET, UBIL, UNIT, UROB, [place], UEPI, UWBC, Junie Panning [place], [place], [place]  Basic Metabolic Panel: Recent Labs  Lab  07/31/20 1634 08/01/20 0731 08/02/20 1129 08/03/20 0637 08/04/20 0719  NA 142 144 142 142 144  K 3.1* 2.9* 3.4* 3.5 3.7  CL 103 104 102 103 104  CO2 25 23 23 22 23   GLUCOSE 163* 96 129* 95 91  BUN 100* 103* 101* 103* 99*  CREATININE 3.80* 3.81* 3.75* 3.79* 3.79*  CALCIUM 5.9* 6.0* 6.0* 6.1* 6.8*  MG  --  1.5*  --  1.5* 1.9  PHOS  --  6.5*  --  6.2* 6.3*   GFR Estimated Creatinine Clearance: 23.5 mL/min (A) (by C-G formula based on SCr of 3.79 mg/dL (H)). Liver Function Tests: Recent Labs  Lab 07/31/20 1634 08/01/20 0731 08/03/20 0637 08/03/20 1712 08/04/20 0719  AST 15 15  --  15  --   ALT 15 15  --  14  --   ALKPHOS 356* 361*  --  397*  --   BILITOT 0.6 1.4*  --  0.8  --   PROT 5.0* 5.3*  --  5.3*  --   ALBUMIN 2.9* 3.1* 3.0* 3.1* 3.1*   No results for input(s): LIPASE, AMYLASE in the last 168 hours. No results for input(s): AMMONIA in the last 168 hours. Coagulation profile Recent Labs  Lab 08/02/20 1332 08/03/20 1251  INR 1.7* 1.4*    CBC: Recent Labs  Lab 08/01/20  0601 08/01/20 1542 08/02/20 1129 08/02/20 2117 08/03/20 0637 08/04/20 0719  WBC 6.9 6.8 6.6 6.3 6.4 6.0  NEUTROABS 5.7  --   --   --   --   --   HGB 7.4* 7.1* 6.5* 7.0* 7.8* 7.9*  HCT 24.5* 23.0* 20.4* 22.4* 23.8* 24.2*  MCV 94.6 95.4 91.9 92.2 90.5 92.0  PLT 129* 129* 122* 135* 129* 131*   Cardiac Enzymes: No results for input(s): CKTOTAL, CKMB, CKMBINDEX, TROPONINI in the last 168 hours. BNP: Invalid input(s): POCBNP CBG: No results for input(s): GLUCAP in the last 168 hours. D-Dimer No results for input(s): DDIMER in the last 72 hours. Hgb A1c No results for input(s): HGBA1C in the last 72 hours. Lipid Profile No results for input(s): CHOL, HDL, LDLCALC, TRIG, CHOLHDL, LDLDIRECT in the last 72 hours. Thyroid function studies No results for input(s): TSH, T4TOTAL, T3FREE, THYROIDAB in the last 72 hours.  Invalid input(s): FREET3 Anemia work up Recent Labs    08/02/20 1129  08/04/20 0719  VITAMINB12  --  835  RETICCTPCT 1.8  --    Microbiology Recent Results (from the past 240 hour(s))  SARS CORONAVIRUS 2 (TAT 6-24 HRS) Nasopharyngeal Nasopharyngeal Swab     Status: None   Collection Time: 08/01/20  7:31 AM   Specimen: Nasopharyngeal Swab  Result Value Ref Range Status   SARS Coronavirus 2 NEGATIVE NEGATIVE Final    Comment: (NOTE) SARS-CoV-2 target nucleic acids are NOT DETECTED.  The SARS-CoV-2 RNA is generally detectable in upper and lower respiratory specimens during the acute phase of infection. Negative results do not preclude SARS-CoV-2 infection, do not rule out co-infections with other pathogens, and should not be used as the sole basis for treatment or other patient management decisions. Negative results must be combined with clinical observations, patient history, and epidemiological information. The expected result is Negative.  Fact Sheet for Patients: SugarRoll.be  Fact Sheet for Healthcare Providers: https://www.woods-mathews.com/  This test is not yet approved or cleared by the [place] FDA and  has been authorized for detection and/or diagnosis of SARS-CoV-2 by FDA under an Emergency Use Authorization (EUA). This EUA will remain  in effect (meaning this test can be used) for the duration of the COVID-19 declaration under Se ction 564(b)(1) of the Act, 21 U.S.C. section 360bbb-3(b)(1), unless the authorization is terminated or revoked sooner.  Performed at [facility], [place] [address], [place],  [address]     Studies:  No results found.  Zissy Hamlett M.D.

## 2020-08-04 NOTE — Progress Notes (Signed)
[facility] Progress Note   Subjective:   GI evaluating - endoscopy today.  No new symptoms.  No urinary symptoms.  I/Os 0.5/1.2 on home torsemide dose.  Cr stable at to 3.79, BUN 103.    Objective Vitals:   08/03/20 1343 08/03/20 1735 08/04/20 0040 08/04/20 0620  BP: (!) 138/59 (!) 154/72 (!) 144/75 (!) 162/80  Pulse: (!) 58 61 (!) 59 (!) 59  Resp: 19 17 19 18   Temp: 97.7 F (36.5 C) 97.7 F (36.5 C) 98.3 F (36.8 C) 97.9 F (36.6 C)  TempSrc: Oral Oral Oral Oral  SpO2: 100% 99% 100% 100%  Weight:    118.7 kg  Height:       Physical Exam Gen: chronically ill but nontoxic  Eyes: anicteric ENT: MMM Neck: supple CV:  RRR, III/VI SEM Abd: soft, nontender, RLQ kidney transplant nontender Lungs: a few scattered rhonchi, normal WOB GU: no foley Extr:  minor ankle edema Neuro: nonfocal Skin: clotted av access noted LUE  Additional Objective Labs: Basic Metabolic Panel: Recent Labs  Lab 08/01/20 0731 08/02/20 1129 08/03/20 0637  NA 144 142 142  K 2.9* 3.4* 3.5  CL 104 102 103  CO2 23 23 22   GLUCOSE 96 129* 95  BUN 103* 101* 103*  CREATININE 3.81* 3.75* 3.79*  CALCIUM 6.0* 6.0* 6.1*  PHOS 6.5*  --  6.2*   Liver Function Tests: Recent Labs  Lab 07/31/20 1634 08/01/20 0731 08/03/20 0637 08/03/20 1712  AST 15 15  --  15  ALT 15 15  --  14  ALKPHOS 356* 361*  --  397*  BILITOT 0.6 1.4*  --  0.8  PROT 5.0* 5.3*  --  5.3*  ALBUMIN 2.9* 3.1* 3.0* 3.1*   No results for input(s): LIPASE, AMYLASE in the last 168 hours. CBC: Recent Labs  Lab 08/01/20 0601 08/01/20 1542 08/02/20 1129 08/02/20 2117 08/03/20 0637 08/04/20 0719  WBC 6.9 6.8 6.6 6.3 6.4 6.0  NEUTROABS 5.7  --   --   --   --   --   HGB 7.4* 7.1* 6.5* 7.0* 7.8* 7.9*  HCT 24.5* 23.0* 20.4* 22.4* 23.8* 24.2*  MCV 94.6 95.4 91.9 92.2 90.5 92.0  PLT 129* 129* 122* 135* 129* 131*   Blood Culture    Component Value Date/Time   SDES IN/OUT CATH URINE 07/04/2020 2229   SPECREQUEST   07/04/2020 2229    NONE Performed at [facility] [address] [place], [place] [address]    CULT MULTIPLE SPECIES PRESENT, SUGGEST RECOLLECTION (A) 07/04/2020 2229   REPTSTATUS 07/08/2020 FINAL 07/04/2020 2229    Cardiac Enzymes: No results for input(s): CKTOTAL, CKMB, CKMBINDEX, TROPONINI in the last 168 hours. CBG: No results for input(s): GLUCAP in the last 168 hours. Iron Studies:  No results for input(s): IRON, TIBC, TRANSFERRIN, FERRITIN in the last 72 hours. @lablastinr3 @ Studies/Results: No results found. Medications:  . sodium chloride   Intravenous Once  . amiodarone  200 mg Oral Daily  . atorvastatin  40 mg Oral QHS  . calcitRIOL  0.5 mcg Oral BID  . calcium-vitamin D  1 tablet Oral TID  . DULoxetine  20 mg Oral Daily  . isosorbide-hydrALAZINE  1.5 tablet Oral TID  . metoprolol succinate  25 mg Oral Daily  . mycophenolate  540 mg Oral BID  . pantoprazole  40 mg Oral Daily  . predniSONE  5 mg Oral Q breakfast  . sevelamer carbonate  800 mg Oral TID WC  .  sodium chloride flush  3 mL Intravenous Q12H  . tacrolimus  5 mg Oral BID  . [START ON 08/05/2020] torsemide  80 mg Oral Daily  . vitamin B-12  1,000 mcg Oral Daily    Assessment/Plan **Anemia: suspect multifactorial ? GI blood loss, hemoccult pending and certainly a component of hypoproliferative due to CKD.  Rec'd dose of ESA 07/15/20, repeated dose 1/26 and will need ongoing dosing out of hospital.  Hb stable in the 7s this AM after transfusion. Hematology eval and GI eval underway as well.   **AKI on CKD:  Renal transplant with recent worsening renal function in setting of CHF and diuresis.  Cr ~3.5 most recently and roughly stable at that level (3.8 yesterday; today's labs pending still) compared to recent hospital discharge but noted is BUN ~ 100 now.  She has not looked hypovolemic and I think the BUN ^ may be due to GI bleeding.  In light of recent massive volume overload we cont torsemide but it's  held currently with NPO and GI bowel prep.   No uremic symptoms, volume or electrolyte issues warranting RRT currently.   **Hypocalcemia, secondary hyperPTH:  Last measured PTH in 800s; PTH resistance in context of CKD.  Doubled calcitriol to 0.5 BID. She's been started on BID oscal as well, increased to TID.  Trend.  Recheck PTH; pending. Phos 6.5, started binder renvela 800 TIDAC   **Hypokalemia:  Repleted, K improved to 3.5  **s/p renal transplant:  Recent tac troughs low and dose adjusted, check level - pending from 1/26 PM (AM dose from 1/26 will be inaccurate on timing).   Cont current immunosuppression with tac, myfortic and pred.   **CHF:  Continue outpt meds including diuretics (torsemide 80 daily).  2g sodium/fluid restriction, daily weights.   **thrombocytopenia:  Modest in 130s, follow.  Hematology is evaluating - reviewed smear, no concern for MAHA.  **HTN:  Variable BP, monitor for now on current meds.    Will follow closely, page with issues.   Jannifer Hick MD 08/04/2020, 8:31 AM  [facility] Pager: [phone]

## 2020-08-05 ENCOUNTER — Inpatient Hospital Stay (HOSPITAL_COMMUNITY): Payer: Medicare Other | Admitting: Anesthesiology

## 2020-08-05 ENCOUNTER — Encounter (HOSPITAL_COMMUNITY)
Admission: EM | Disposition: A | Discharge status: Skilled Nursing Facility | Payer: Self-pay | Source: Skilled Nursing Facility | Attending: Internal Medicine

## 2020-08-05 HISTORY — PX: ESOPHAGOGASTRODUODENOSCOPY (EGD) WITH PROPOFOL: SHX5813

## 2020-08-05 HISTORY — PX: COLONOSCOPY WITH PROPOFOL: SHX5780

## 2020-08-05 LAB — CBC
HCT: 25.5 % — ABNORMAL LOW (ref 36.0–46.0)
Hemoglobin: 7.9 g/dL — ABNORMAL LOW (ref 12.0–15.0)
MCH: 29.2 pg (ref 26.0–34.0)
MCHC: 31 g/dL (ref 30.0–36.0)
MCV: 94.1 fL (ref 80.0–100.0)
Platelets: 112 10*3/uL — ABNORMAL LOW (ref 150–400)
RBC: 2.71 MIL/uL — ABNORMAL LOW (ref 3.87–5.11)
RDW: 17.2 % — ABNORMAL HIGH (ref 11.5–15.5)
WBC: 6.5 10*3/uL (ref 4.0–10.5)
nRBC: 0 % (ref 0.0–0.2)

## 2020-08-05 LAB — MAGNESIUM: Magnesium: 1.8 mg/dL (ref 1.7–2.4)

## 2020-08-05 LAB — IGG, IGA, IGM
IgA: 148 mg/dL (ref 87–352)
IgG (Immunoglobin G), Serum: 678 mg/dL (ref 586–1602)
IgM (Immunoglobulin M), Srm: 22 mg/dL — ABNORMAL LOW (ref 26–217)

## 2020-08-05 LAB — RENAL FUNCTION PANEL
Albumin: 3.1 g/dL — ABNORMAL LOW (ref 3.5–5.0)
Anion gap: 16 — ABNORMAL HIGH (ref 5–15)
BUN: 94 mg/dL — ABNORMAL HIGH (ref 6–20)
CO2: 24 mmol/L (ref 22–32)
Calcium: 6.8 mg/dL — ABNORMAL LOW (ref 8.9–10.3)
Chloride: 102 mmol/L (ref 98–111)
Creatinine, Ser: 3.63 mg/dL — ABNORMAL HIGH (ref 0.44–1.00)
GFR, Estimated: 14 mL/min — ABNORMAL LOW (ref >60)
Glucose, Bld: 93 mg/dL (ref 70–99)
Phosphorus: 5.8 mg/dL — ABNORMAL HIGH (ref 2.5–4.6)
Potassium: 3.6 mmol/L (ref 3.5–5.1)
Sodium: 142 mmol/L (ref 135–145)

## 2020-08-05 LAB — TACROLIMUS LEVEL: Tacrolimus (FK506) - LabCorp: 4.7 ng/mL (ref 2.0–20.0)

## 2020-08-05 SURGERY — COLONOSCOPY WITH PROPOFOL
Anesthesia: Monitor Anesthesia Care

## 2020-08-05 MED ORDER — SODIUM CHLORIDE 0.9 % IV SOLN
INTRAVENOUS | Status: AC | PRN
  Administered 2020-08-05: 500 mL via INTRAMUSCULAR

## 2020-08-05 MED ORDER — PROPOFOL 500 MG/50ML IV EMUL
INTRAVENOUS | Status: DC | PRN
  Administered 2020-08-05: 100 ug/kg/min via INTRAVENOUS

## 2020-08-05 MED ORDER — SODIUM CHLORIDE 0.9 % IV SOLN: INTRAVENOUS | Status: DC | PRN

## 2020-08-05 MED ORDER — PROPOFOL 10 MG/ML IV BOLUS
INTRAVENOUS | Status: DC | PRN
  Administered 2020-08-05: 20 mg via INTRAVENOUS

## 2020-08-05 MED ORDER — APIXABAN 5 MG PO TABS
5.0000 mg | ORAL_TABLET | Freq: Two times a day (BID) | ORAL | Status: DC
  Administered 2020-08-05 – 2020-08-06 (×2): 5 mg via ORAL
  Filled 2020-08-05 (×3): qty 1

## 2020-08-05 MED ORDER — MAGNESIUM SULFATE 2 GM/50ML IV SOLN
2.0000 g | Freq: Once | INTRAVENOUS | Status: AC
  Administered 2020-08-05: 2 g via INTRAVENOUS
  Filled 2020-08-05: qty 50

## 2020-08-05 MED ORDER — TORSEMIDE 20 MG PO TABS: 80.0000 mg | ORAL_TABLET | Freq: Every day | ORAL | Status: DC

## 2020-08-05 MED ORDER — POTASSIUM CHLORIDE CRYS ER 20 MEQ PO TBCR
40.0000 meq | EXTENDED_RELEASE_TABLET | Freq: Once | ORAL | Status: AC
  Administered 2020-08-05: 40 meq via ORAL
  Filled 2020-08-05: qty 2

## 2020-08-05 MED ORDER — TORSEMIDE 20 MG PO TABS
80.0000 mg | ORAL_TABLET | Freq: Every day | ORAL | Status: DC
  Administered 2020-08-05 – 2020-08-06 (×3): 80 mg via ORAL
  Filled 2020-08-05 (×2): qty 4

## 2020-08-05 SURGICAL SUPPLY — 25 items
BLOCK BITE 60FR ADLT L/F BLUE (MISCELLANEOUS) ×2 IMPLANT
ELECT REM PT RETURN 9FT ADLT (ELECTROSURGICAL)
ELECTRODE REM PT RTRN 9FT ADLT (ELECTROSURGICAL) IMPLANT
FCP BXJMBJMB 240X2.8X (CUTTING FORCEPS)
FLOOR PAD 36X40 (MISCELLANEOUS) ×2
FORCEP RJ3 GP 1.8X160 W-NEEDLE (CUTTING FORCEPS) IMPLANT
FORCEPS BIOP RAD 4 LRG CAP 4 (CUTTING FORCEPS) IMPLANT
FORCEPS BIOP RJ4 240 W/NDL (CUTTING FORCEPS)
FORCEPS BXJMBJMB 240X2.8X (CUTTING FORCEPS) IMPLANT
INJECTOR/SNARE I SNARE (MISCELLANEOUS) IMPLANT
LUBRICANT JELLY 4.5OZ STERILE (MISCELLANEOUS) IMPLANT
MANIFOLD NEPTUNE II (INSTRUMENTS) IMPLANT
NDL SCLEROTHERAPY 25GX240 (NEEDLE) IMPLANT
NEEDLE SCLEROTHERAPY 25GX240 (NEEDLE) IMPLANT
PAD FLOOR 36X40 (MISCELLANEOUS) ×1 IMPLANT
PROBE APC STR FIRE (PROBE) IMPLANT
PROBE INJECTION GOLD (MISCELLANEOUS)
PROBE INJECTION GOLD 7FR (MISCELLANEOUS) IMPLANT
SNARE ROTATE MED OVAL 20MM (MISCELLANEOUS) IMPLANT
SNARE SHORT THROW 13M SML OVAL (MISCELLANEOUS) IMPLANT
SYR 50ML LL SCALE MARK (SYRINGE) IMPLANT
TRAP SPECIMEN MUCOUS 40CC (MISCELLANEOUS) IMPLANT
TUBING ENDO SMARTCAP PENTAX (MISCELLANEOUS) ×4 IMPLANT
TUBING IRRIGATION ENDOGATOR (MISCELLANEOUS) ×4 IMPLANT
WATER STERILE IRR 1000ML POUR (IV SOLUTION) IMPLANT

## 2020-08-05 NOTE — Interval H&P Note (Signed)
History and Physical Interval Note:  08/05/2020 9:30 AM  [name]  has presented today for surgery, with the diagnosis of Anemia.  Adenomatous colon polyps 04/2017..  The various methods of treatment have been discussed with the patient and family. After consideration of risks, benefits and other options for treatment, the patient has consented to  Procedure(s): COLONOSCOPY WITH PROPOFOL (N/A) ESOPHAGOGASTRODUODENOSCOPY (EGD) WITH PROPOFOL (N/A) as a surgical intervention.  The patient's history has been reviewed, patient examined, no change in status, stable for surgery.  I have reviewed the patient's chart and labs.  Questions were answered to the patient's satisfaction.     Chermaine Schnyder D

## 2020-08-05 NOTE — Anesthesia Preprocedure Evaluation (Addendum)
Anesthesia Evaluation  Patient identified by MRN, date of birth, ID band Patient awake    Reviewed: Allergy & Precautions, NPO status , Patient's Chart, lab work & pertinent test results  Airway Mallampati: III  TM Distance: >3 FB Neck ROM: Full    Dental  (+) Poor Dentition, Missing   Pulmonary former smoker,    Pulmonary exam normal breath sounds clear to auscultation       Cardiovascular hypertension, Pt. on home beta blockers +CHF and + DVT  Normal cardiovascular exam Rhythm:Regular Rate:Normal  ECHO: 1. Left ventricular ejection fraction, by estimation, is 25 to 30%. The left ventricle has severely decreased function. The left ventricle demonstrates global hypokinesis. The left ventricular internal cavity size was mildly dilated. There is severe left ventricular hypertrophy. Left ventricular diastolic parameters are consistent with Grade III diastolic dysfunction (restrictive). Elevated left atrial pressure. 2. Right ventricular systolic function is moderately reduced. The right ventricular size is mildly enlarged. There is mildly elevated pulmonary artery systolic pressure. The estimated right ventricular systolic pressure is 15.4 mmHg. 3. Left atrial size was mildly dilated. 4. The mitral valve is normal in structure. Mild mitral valve regurgitation. 5. Tricuspid valve regurgitation is mild to moderate. 6. The aortic valve is tricuspid. There is moderate calcification of the aortic valve. Aortic valve regurgitation is moderate. Mild to moderate aortic valve sclerosis/calcification is present, without any evidence of aortic stenosis.   Neuro/Psych PSYCHIATRIC DISORDERS negative neurological ROS     GI/Hepatic Neg liver ROS, GERD  Medicated and Controlled,  Endo/Other  diabetes, Insulin Dependent  Renal/GU ARFRenal diseaseS/p renal transplant      Musculoskeletal negative musculoskeletal ROS (+)   Abdominal (+)  + obese,   Peds  Hematology  (+) anemia , HLD   Anesthesia Other Findings Anemia   Adenomatous colon polyps 04/2017  Reproductive/Obstetrics                            Anesthesia Physical Anesthesia Plan  ASA: IV  Anesthesia Plan: MAC   Post-op Pain Management:    Induction: Intravenous  PONV Risk Score and Plan: 2 and Propofol infusion and Treatment may vary due to age or medical condition  Airway Management Planned: Nasal Cannula  Additional Equipment:   Intra-op Plan:   Post-operative Plan:   Informed Consent: I have reviewed the patients History and Physical, chart, labs and discussed the procedure including the risks, benefits and alternatives for the proposed anesthesia with the patient or authorized representative who has indicated his/her understanding and acceptance.     Dental advisory given  Plan Discussed with: CRNA  Anesthesia Plan Comments:        Anesthesia Quick Evaluation

## 2020-08-05 NOTE — Transfer of Care (Signed)
Immediate Anesthesia Transfer of Care Note  Patient: [name]  Procedure(s) Performed: COLONOSCOPY WITH PROPOFOL (N/A ) ESOPHAGOGASTRODUODENOSCOPY (EGD) WITH PROPOFOL (N/A )  Patient Location: Endoscopy Unit  Anesthesia Type:MAC  Level of Consciousness: sedated  Airway & Oxygen Therapy: Patient Spontanous Breathing and Patient connected to nasal cannula oxygen  Post-op Assessment: Report given to RN and Post -op Vital signs reviewed and stable  Post vital signs: Reviewed and stable  Last Vitals:  Vitals Value Taken Time  BP 109/42 08/05/20 1028  Temp 36.7 C 08/05/20 1028  Pulse 64 08/05/20 1028  Resp 22 08/05/20 1028  SpO2 95 % 08/05/20 1028    Last Pain:  Vitals:   08/05/20 1028  TempSrc: Temporal  PainSc: 0-No pain         Complications: No complications documented.

## 2020-08-05 NOTE — Progress Notes (Signed)
Consent placed in chart

## 2020-08-05 NOTE — Progress Notes (Signed)
ANTICOAGULATION CONSULT NOTE - Initial Consult  Pharmacy Consult for apixaban Indication: h/o VTE / afib  Allergies  Allergen Reactions  . Infed [Iron Dextran] Other (See Comments)    Pt has chest pain  Chest pain  . Cefazolin Itching and Nausea And Vomiting  . Ancef [Cefazolin Sodium] Other (See Comments)    Tolerated rocephin 11/03/10 Reports ancef causes itching, sweating, vomiting (x2-2009?)  . Lisinopril Swelling  . Other     Pt may be allergic to another antibiotic daughter isn't sure and pt unable to verify.    Patient Measurements: Height: 5\' 11"  (180.3 cm) Weight: 120 kg (264 lb 8.8 oz) IBW/kg (Calculated) : 70.8 Heparin Dosing Weight: 93.8kg  Vital Signs: Temp: 97.3 F (36.3 C) (01/29 1213) Temp Source: Oral (01/29 1213) BP: 172/79 (01/29 1213) Pulse Rate: 60 (01/29 1213)  Labs: Recent Labs    08/03/20 0637 08/03/20 1251 08/04/20 0719 08/04/20 1918 08/05/20 0502  HGB 7.8*  --  7.9*  --  7.9*  HCT 23.8*  --  24.2*  --  25.5*  PLT 129*  --  131*  --  112*  APTT 74* 69* 70* 59*  --   LABPROT  --  16.6*  --   --   --   INR  --  1.4*  --   --   --   HEPARINUNFRC 1.50*  --  0.69  --   --   CREATININE 3.79*  --  3.79*  --  3.63*    Estimated Creatinine Clearance: 24.7 mL/min (A) (by C-G formula based on SCr of 3.63 mg/dL (H)).   Medical History: Past Medical History:  Diagnosis Date  . Acute heart failure ([place]) 06/2020  . Blood transfusion without reported diagnosis   . CHF (congestive heart failure) ([place])   . 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 GERD (gastroesophageal reflux disease)    medication induced  . Hypertension   . Renal failure    hx of  . 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 Patient to have endoscopy and apixaban has been held (last dose this AM ~8am). Pharmacy consulted to dose heparin in the interim while apixaban on hold and now after colonoscopy has been completed- will restart apixaban for oral anticoagulation.    Goal of Therapy:  Monitor platelets by anticoagulation protocol: Yes   Plan:  resume apixaban 5mg  PO BID tonight - 1/29 PM  Monitor s/sx bleeding   Wilson Singer, PharmD PGY1 Pharmacy Resident 08/05/2020 1:40 PM

## 2020-08-05 NOTE — Op Note (Signed)
[facility] Patient Name: [name] Procedure Date : 08/05/2020 MRN: [id number] Attending MD: Carol Ada , MD Date of Birth: 07-12-1965 CSN: [id number] Age: 55 Admit Type: Inpatient Procedure:                Upper GI endoscopy Indications:              Iron deficiency anemia Providers:                Carol Ada, MD, Erenest Rasher, RN, William Dalton, Technician Referring MD:              Medicines:                Propofol per Anesthesia Complications:            No immediate complications. Estimated Blood Loss:     Estimated blood loss: none. Procedure:                Pre-Anesthesia Assessment:                           - Prior to the procedure, a History and Physical                            was performed, and patient medications and                            allergies were reviewed. The patient's tolerance of                            previous anesthesia was also reviewed. The risks                            and benefits of the procedure and the sedation                            options and risks were discussed with the patient.                            All questions were answered, and informed consent                            was obtained. Prior Anticoagulants: The patient has                            taken no previous anticoagulant or antiplatelet                            agents. ASA Grade Assessment: III - A patient with                            severe systemic disease. After reviewing the risks  and benefits, the patient was deemed in                            satisfactory condition to undergo the procedure.                           - Sedation was administered by an anesthesia                            professional. Deep sedation was attained.                           After obtaining informed consent, the endoscope was                            passed under direct vision.  Throughout the                            procedure, the patient's blood pressure, pulse, and                            oxygen saturations were monitored continuously. The                            PCF-H190DL ([id number]) Olympus pediatric colonoscope                            was introduced through the mouth, and advanced to                            the second part of duodenum. The upper GI endoscopy                            was accomplished without difficulty. The patient                            tolerated the procedure well. Scope In: Scope Out: Findings:      The esophagus was normal.      The stomach was normal.      The examined duodenum was normal. Impression:               - Normal esophagus.                           - Normal stomach.                           - Normal examined duodenum.                           - No specimens collected. Recommendation:           - Proceed with the colonoscopy. Procedure Code(s):        --- Professional ---  16606, Esophagogastroduodenoscopy, flexible,                            transoral; diagnostic, including collection of                            specimen(s) by brushing or washing, when performed                            (separate procedure) Diagnosis Code(s):        --- Professional ---                           D50.9, Iron deficiency anemia, unspecified CPT copyright 2019 American Medical Association. All rights reserved. The codes documented in this report are preliminary and upon coder review may  be revised to meet current compliance requirements. Carol Ada, MD Carol Ada, MD 08/05/2020 10:33:36 AM This report has been signed electronically. Number of Addenda: 0

## 2020-08-05 NOTE — Progress Notes (Signed)
[facility] Progress Note   Subjective:   GI evaluating - endoscopy today after incomplete prep yesterday; she reports no pathology.  No new symptoms.  No urinary symptoms.  I/Os 1/1.5L on home torsemide dose.  Cr sl improved at to 3.79 > 3.63, BUN 103 > 94.  Hb stable 7.9  Objective Vitals:   08/05/20 1030 08/05/20 1040 08/05/20 1050 08/05/20 1213  BP:  (!) 105/40  (!) 172/79  Pulse: 64 62 64 60  Resp: (!) 25 19 (!) 24 16  Temp:    (!) 97.3 F (36.3 C)  TempSrc:    Oral  SpO2: 95% 96% 97% 100%  Weight:      Height:       Physical Exam Gen: chronically ill but nontoxic  Eyes: anicteric ENT: MMM Neck: supple CV:  RRR, III/VI SEM Abd: soft, nontender, RLQ kidney transplant nontender Lungs: a few scattered rhonchi, normal WOB GU: no foley Extr:  minor ankle edema Neuro: nonfocal Skin: clotted av access noted LUE  Additional Objective Labs: Basic Metabolic Panel: Recent Labs  Lab 08/03/20 0637 08/04/20 0719 08/05/20 0502  NA 142 144 142  K 3.5 3.7 3.6  CL 103 104 102  CO2 22 23 24   GLUCOSE 95 91 93  BUN 103* 99* 94*  CREATININE 3.79* 3.79* 3.63*  CALCIUM 6.1* 6.8* 6.8*  PHOS 6.2* 6.3* 5.8*   Liver Function Tests: Recent Labs  Lab 07/31/20 1634 08/01/20 0731 08/03/20 0637 08/03/20 1712 08/04/20 0719 08/05/20 0502  AST 15 15  --  15  --   --   ALT 15 15  --  14  --   --   ALKPHOS 356* 361*  --  397*  --   --   BILITOT 0.6 1.4*  --  0.8  --   --   PROT 5.0* 5.3*  --  5.3*  --   --   ALBUMIN 2.9* 3.1*   < > 3.1* 3.1* 3.1*   < > = values in this interval not displayed.   No results for input(s): LIPASE, AMYLASE in the last 168 hours. CBC: Recent Labs  Lab 08/01/20 0601 08/01/20 1542 08/02/20 1129 08/02/20 2117 08/03/20 0637 08/04/20 0719 08/05/20 0502  WBC 6.9   < > 6.6 6.3 6.4 6.0 6.5  NEUTROABS 5.7  --   --   --   --   --   --   HGB 7.4*   < > 6.5* 7.0* 7.8* 7.9* 7.9*  HCT 24.5*   < > 20.4* 22.4* 23.8* 24.2* 25.5*  MCV 94.6    < > 91.9 92.2 90.5 92.0 94.1  PLT 129*   < > 122* 135* 129* 131* 112*   < > = values in this interval not displayed.   Blood Culture    Component Value Date/Time   SDES IN/OUT CATH URINE 07/04/2020 2229   SPECREQUEST  07/04/2020 2229    NONE Performed at [facility], [place] [address] [place],  [address]    CULT MULTIPLE SPECIES PRESENT, SUGGEST RECOLLECTION (A) 07/04/2020 2229   REPTSTATUS 07/08/2020 FINAL 07/04/2020 2229    Cardiac Enzymes: No results for input(s): CKTOTAL, CKMB, CKMBINDEX, TROPONINI in the last 168 hours. CBG: No results for input(s): GLUCAP in the last 168 hours. Iron Studies:  No results for input(s): IRON, TIBC, TRANSFERRIN, FERRITIN in the last 72 hours. @lablastinr3 @ Studies/Results: No results found. Medications:  . sodium chloride   Intravenous Once  . amiodarone  200 mg  Oral Daily  . apixaban  5 mg Oral BID  . atorvastatin  40 mg Oral QHS  . calcitRIOL  0.5 mcg Oral BID  . calcium-vitamin D  1 tablet Oral TID  . DULoxetine  20 mg Oral Daily  . isosorbide-hydrALAZINE  1.5 tablet Oral TID  . metoprolol succinate  25 mg Oral Daily  . mycophenolate  540 mg Oral BID  . pantoprazole  40 mg Oral Daily  . predniSONE  5 mg Oral Q breakfast  . sevelamer carbonate  800 mg Oral TID WC  . sodium chloride flush  3 mL Intravenous Q12H  . tacrolimus  5 mg Oral BID  . torsemide  80 mg Oral Daily  . vitamin B-12  1,000 mcg Oral Daily    Assessment/Plan **Anemia: suspect multifactorial ? GI blood loss, hemoccult pending and certainly a component of hypoproliferative due to CKD.  Rec'd dose of ESA 07/15/20, repeated dose 1/26 and will need ongoing dosing out of hospital.  Hb stable in the 7s this AM after transfusion. Hematology eval and GI eval underway as well.   **AKI on CKD:  Renal transplant with recent worsening renal function in setting of CHF and diuresis.  Cr ~3.5 most recently and roughly stable at that level (3.6 today) compared to recent  hospital discharge.  She has not looked hypovolemic.  In light of recent massive volume overload we cont torsemide except when doing bowel prep.   No uremic symptoms, volume or electrolyte issues warranting RRT currently.   **Hypocalcemia, secondary hyperPTH:  Last measured PTH in 800s; PTH resistance in context of CKD.  Doubled calcitriol to 0.5 BID. She's been started on BID oscal as well, increased to TID.  Trend.  Recheck PTH is 1548.  Phos 6.5, started binder renvela 800 TIDAC. PTH/Corr ca can be followed outpt at higher calcitriol dose.   **Hypokalemia:  Repleted, K improved to 3.6  **s/p renal transplant:  Recent tac troughs low and dose adjusted, check level - pending from 1/26 PM (AM dose from 1/26 will be inaccurate on timing).   Cont current immunosuppression with tac, myfortic and pred.   **CHF:  Continue outpt meds including diuretics (torsemide 80 daily).  2g sodium/fluid restriction, daily weights.   **thrombocytopenia:  Modest in 120-130s, follow.  Hematology is evaluating - reviewed smear, no concern for MAHA.  **HTN:  Variable BP, monitor for now on current meds.    Will follow closely, page with issues.   OK to d/c from my perspective.  Asking for clinic f/u in 2-4 weeks at [facility], [employer] today. She will notify [facility].   I'll look at her tac trough when it results but I won't plan to see if she ends up  Staying overnight.  Jannifer Hick MD 08/05/2020, 2:07 PM  [facility] Pager: [phone]

## 2020-08-05 NOTE — Anesthesia Postprocedure Evaluation (Signed)
Anesthesia Post Note  Patient: [name]  Procedure(s) Performed: COLONOSCOPY WITH PROPOFOL (N/A ) ESOPHAGOGASTRODUODENOSCOPY (EGD) WITH PROPOFOL (N/A )     Patient location during evaluation: Endoscopy Anesthesia Type: MAC Level of consciousness: awake Pain management: pain level controlled Vital Signs Assessment: post-procedure vital signs reviewed and stable Respiratory status: spontaneous breathing, nonlabored ventilation, respiratory function stable and patient connected to nasal cannula oxygen Cardiovascular status: stable and blood pressure returned to baseline Postop Assessment: no apparent nausea or vomiting Anesthetic complications: no   No complications documented.  Last Vitals:  Vitals:   08/05/20 1050 08/05/20 1213  BP:  (!) 172/79  Pulse: 64 60  Resp: (!) 24 16  Temp:  (!) 36.3 C  SpO2: 97% 100%    Last Pain:  Vitals:   08/05/20 1213  TempSrc: Oral  PainSc:                  Toddrick Sanna P Clara Herbison

## 2020-08-05 NOTE — Op Note (Signed)
[facility] Patient Name: [name] Procedure Date : 08/05/2020 MRN: [id number] Attending MD: Carol Ada , MD Date of Birth: [birth year] CSN: [id number] Age: 55 Admit Type: Inpatient Procedure:                Colonoscopy Indications:              Iron deficiency anemia Providers:                Carol Ada, MD, Erenest Rasher, RN, William Dalton, Technician Referring MD:              Medicines:                Propofol per Anesthesia Complications:            No immediate complications. Estimated Blood Loss:     Estimated blood loss: none. Procedure:                Pre-Anesthesia Assessment:                           - Prior to the procedure, a History and Physical                            was performed, and patient medications and                            allergies were reviewed. The patient's tolerance of                            previous anesthesia was also reviewed. The risks                            and benefits of the procedure and the sedation                            options and risks were discussed with the patient.                            All questions were answered, and informed consent                            was obtained. Prior Anticoagulants: The patient has                            taken no previous anticoagulant or antiplatelet                            agents. ASA Grade Assessment: III - A patient with                            severe systemic disease. After reviewing the risks                            and  benefits, the patient was deemed in                            satisfactory condition to undergo the procedure.                           - Sedation was administered by an anesthesia                            professional. Deep sedation was attained.                           After obtaining informed consent, the colonoscope                            was passed under direct vision. Throughout the                             procedure, the patient's blood pressure, pulse, and                            oxygen saturations were monitored continuously. The                            PCF-H190DL ([id number] Olympus pediatric colonoscope                            was introduced through the anus and advanced to the                            the cecum, identified by appendiceal orifice and                            ileocecal valve. The colonoscopy was somewhat                            difficult due to significant looping. Successful                            completion of the procedure was aided by using                            manual pressure and straightening and shortening                            the scope to obtain bowel loop reduction. The                            patient tolerated the procedure well. The quality                            of the bowel preparation was good. The ileocecal  valve, appendiceal orifice, and rectum were                            photographed. Scope In: 9:59:04 AM Scope Out: 10:21:19 AM Scope Withdrawal Time: 0 hours 12 minutes 29 seconds  Total Procedure Duration: 0 hours 22 minutes 15 seconds  Findings:      The entire examined colon appeared normal. Images were not possible as a       result of a technical difficulty. Impression:               - The entire examined colon is normal.                           - No specimens collected. Recommendation:           - Return patient to hospital ward for ongoing care.                           - Resume regular diet.                           - Continue present medications.                           - Agree with Aranesp and supplemental iron.                           - If recurrent severe anemia recurrs with iron                            supplementation, a VCE can be performed as an                            outpatient.                           - Follow up with [facility], if necessary. Procedure Code(s):        --- Professional ---                           [phone], Colonoscopy, flexible; diagnostic, including                            collection of specimen(s) by brushing or washing,                            when performed (separate procedure) Diagnosis Code(s):        --- Professional ---                           D50.9, Iron deficiency anemia, unspecified CPT copyright 2019 American Medical Association. All rights reserved. The codes documented in this report are preliminary and upon coder review may  be revised to meet current compliance requirements. Carol Ada, MD Carol Ada, MD 08/05/2020 10:32:09 AM This report has been signed electronically. Number of Addenda: 0

## 2020-08-05 NOTE — Progress Notes (Signed)
Clear bowel movement noted

## 2020-08-05 NOTE — Progress Notes (Signed)
Patient noted to have drunk half of prep. Encouraged mutiple time to drink bowel prep but refused. She had 2 bowel movements but are not clear so far. Dr Benson Norway made aware

## 2020-08-05 NOTE — Discharge Summary (Addendum)
Name: [name] MRN: [id number] DOB: [birth year] 55 y.o. PCP: Tracey Heck, DO  Date of Admission: 07/31/2020  4:33 PM Date of Discharge: 08/06/18 Attending Physician: [name]   Discharge Diagnosis: 1. Acute on Chronic Anemia 2. Anemia of Chronic Disease  3. CKD Stage V s/p 2011 renal transplant  4. Secondary Hyperparathyroidism from CKD 5. Hypocalcemia  6. Hypokalemia 7. Chronic Biventricular Heart Failure  8. Atrial Fibrillation   9. Antithrombin III Deficiency 10. Hypertension   Discharge Medications: Allergies as of 08/06/2020      Reactions   Infed [iron Dextran] Other (See Comments)   Pt has chest pain  Chest pain   Cefazolin Itching, Nausea And Vomiting   Ancef [cefazolin Sodium] Other (See Comments)   Tolerated rocephin 11/03/10 Reports ancef causes itching, sweating, vomiting (x2-2009?)   Lisinopril Swelling   Other    Pt may be allergic to another antibiotic daughter isn't sure and pt unable to verify.      Medication List    TAKE these medications   acetaminophen 325 MG tablet Commonly known as: TYLENOL Take 2 tablets (650 mg total) by mouth every 6 (six) hours as needed for mild pain (or Fever >/= 101).   albuterol (2.5 MG/3ML) 0.083% nebulizer solution Commonly known as: PROVENTIL Take 3 mLs (2.5 mg total) by nebulization every 6 (six) hours as needed for wheezing or shortness of breath.   amiodarone 200 MG tablet Commonly known as: PACERONE Take 1 tablet (200 mg total) by mouth daily.   apixaban 5 MG Tabs tablet Commonly known as: ELIQUIS Take 1 tablet (5 mg total) by mouth 2 (two) times daily.   atorvastatin 40 MG tablet Commonly known as: LIPITOR Take 1 tablet (40 mg total) by mouth daily. What changed: when to take this   calcitRIOL 0.5 MCG capsule Commonly known as: ROCALTROL Take 1 capsule (0.5 mcg total) by mouth 2 (two) times daily. What changed: when to take this   calcium-vitamin D 500-200 MG-UNIT  tablet Commonly known as: OSCAL WITH D Take 1 tablet by mouth 3 (three) times daily. What changed: when to take this   Darbepoetin Alfa 60 MCG/0.3ML Sosy injection Commonly known as: ARANESP Inject 0.3 mLs (60 mcg total) into the skin every Saturday at 6 PM.   DULoxetine 20 MG capsule Commonly known as: CYMBALTA Take one 20 mg tablet for one week, then take 1/2 tablet for two weeks, then stop medication What changed:   medication strength  how much to take  how to take this  when to take this  additional instructions   ferrous sulfate 325 (65 FE) MG tablet Take 1 tablet (325 mg total) by mouth 2 (two) times daily with a meal.   guaiFENesin-dextromethorphan 100-10 MG/5ML syrup Commonly known as: ROBITUSSIN DM Take 5 mLs by mouth every 4 (four) hours as needed for cough.   insulin aspart 100 UNIT/ML injection Commonly known as: novoLOG Inject 0-20 Units into the skin 3 (three) times daily with meals.   isosorbide-hydrALAZINE 20-37.5 MG tablet Commonly known as: BIDIL Take 1.5 tablets by mouth 3 (three) times daily. What changed: how much to take   loperamide 2 MG capsule Commonly known as: IMODIUM Take 2 capsules (4 mg total) by mouth daily as needed for diarrhea or loose stools.   loratadine 10 MG tablet Commonly known as: CLARITIN Take 1 tablet (10 mg total) by mouth daily. What changed:   when to take this  reasons to take this  metoprolol succinate 25 MG 24 hr tablet Commonly known as: TOPROL-XL Take 1 tablet (25 mg total) by mouth daily.   mycophenolate 180 MG EC tablet Commonly known as: MYFORTIC TAKE 3 TABLETS BY MOUTH TWICE DAILY   omeprazole 40 MG capsule Commonly known as: PRILOSEC Take 40 mg by mouth daily.   predniSONE 5 MG tablet Commonly known as: DELTASONE Take 1 tablet (5 mg total) by mouth daily with breakfast.   sevelamer carbonate 800 MG tablet Commonly known as: RENVELA Take 1 tablet (800 mg total) by mouth 3 (three) times  daily with meals.   tacrolimus 5 MG capsule Commonly known as: Prograf Take 1 capsule (5 mg total) by mouth 2 (two) times daily.   torsemide 20 MG tablet Commonly known as: DEMADEX Take 4 tablets (80 mg total) by mouth daily.   triamcinolone ointment 0.1 % Commonly known as: KENALOG Apply 1 application topically 2 (two) times daily as needed (rash).   vitamin B-12 1000 MCG tablet Commonly known as: CYANOCOBALAMIN Take 1 tablet (1,000 mcg total) by mouth daily.       Disposition and follow-up:   [name] was discharged from [facility] in Stable condition.  At the hospital follow up visit please address:  1. Acute on Chronic Anemia: EGD & colonoscopy nml. Abnml blood smear w/small amount of schistocytes also seen by Heme. Heme planning bone marrow biopsy outpatient.Continuing eliquis at dc, CBC stable. GI recs video capsule endoscopy if recurrence of Hgb drop.  2. Anemia of Chronic Disease 3. CKD Stage V s/p 2011 renal transplant: stable but progressed to stage V. Seen by nephro during admission. Need to continue tapering cymbalta.  4. Secondary Hyperparathyroidism from CKD: calcitriol increased to BID, oscal with D 500-200 increased to tid, renvela also started  5. Hypocalcemia: due to 2/2 hyperparathyoidism. Corrected Ca 6.6 at admit, improved to 7.8 right before discharge. 6.8 day of discharge, gave 1g IV Ca. Continue to monitor.  7. Chronic Biventricular Heart Failure: continuing home medications at discharge  8. Atrial Fibrillation: continue eliquis  9. Antithrombin III Deficiency 10. Hypertension: Bidil decreased to 1.5 tablet tid  11. Depression: Should not be on Cymbalta with CKD stage V. Continue to taper - 20 mg for one week, then 10 mg for two weeks before stopping.    2.  Labs / imaging needed at time of follow-up: CBC, BMP, Mg, Ca  3.  Pending labs/ test needing follow-up: none  Follow-up Appointments:  Heme-Onc with Dr. Gary Fleet Course by problem list: 1. Acute on Chronic Anemia:  2. Anemia of Chronic Disease  3. CKD Stage V s/p 2011 renal transplant  4. Secondary Hyperparathyroidism from CKD 5. Hypocalcemia  6. Hypokalemia 7. Chronic Biventricular Heart Failure  8. Atrial Fibrillation   9. Antithrombin III Deficiency  [name] is a 55yo female with PMH of Anemia of Chronic Disease, CKD Stage IV s/p renal transplant in 2011, secondary hyperparathyroidism, chronic biventricular heart failure, atrial fibrillation, hypertension and antithrombin III deficiency who presented to the hospital from her skilled nursing facility for hemoglobin of 5.3. Eliquis was held. She would not have known except she was feeling more tired than usual, no signs or symptoms of GI bleed. Hemoglobin was initially stable after 2U transfusion, but then dropped to 6.5, and ended up requiring two more transfusions. GI was consulted and she underwent bowel prep, which took two days due to poor tolerance of the prep. Smear review was also  done as patient was asymptomatic and showed teardrop cells and polychromasia. Reticulocyte index showed hypoproliferation. Hematology was consulted and also saw small amount of schistocytes on smear.  EGD and colonoscopy were done, which were normal. GI recommended capsule endoscopy if acute drop occurs again. As there were no findings on EGD or colonoscopy, hematology is planning outpatient bone marrow biopsy. Her eliquis was continued at discharge as she has history of VTE, atrial fibrillation and antithrombin III deficiency.   Hypocalcemia  Secondary Hyperparathyroidism from CKD  CKD Stage V Corrected calcium at admission was 6.6. She was asymptomatic. She was initially treated with calcium gluconate before being transitioned to po calcium with improvement of calcium. Nephrology was consulted as she had progressed to CKD Stage V. Calcitriol was increased to .5 bid and was increased to tid oscal  as well. Renvela 800 TID ac was started. Calcium improved to 7.8 the day before discharge but was 6.7 correct day of discharge. She was transfused 1 g of Ca prior to discharge and will need follow-up for monitoring of her hypocalcemia.   Discharge Vitals:   BP (!) 146/64 (BP Location: Right Wrist)   Pulse 62   Temp 98.5 F (36.9 C) (Oral)   Resp 20   Ht 5\' 11"  (1.803 m)   Wt 118.2 kg   LMP 11/05/2012   SpO2 100%   BMI 36.34 kg/m   Pertinent Labs, Studies, and Procedures:    EGD 1/29 Findings:      The esophagus was normal.      The stomach was normal.      The examined duodenum was normal. Impression:                - Normal esophagus. - Normal stomach. - Normal examined duodenum. - No specimens collected. Recommendation:            - Proceed with the colonoscopy.  Colonoscopy 1/29 Findings:      The entire examined colon appeared normal. Images were not possible as a       result of a technical difficulty. Impression:                - The entire examined colon is normal. - No specimens collected. Recommendation:            - Return patient to hospital ward for ongoing care. - Resume regular diet. - Continue present medications. - Agree with Aranesp and supplemental iron. - If recurrent severe anemia recurrs with iron                            supplementation, a VCE can be performed as an                            outpatient.  - Follow up with [facility], if necessary.  Discharge Instructions: Discharge Instructions    Call MD for:  difficulty breathing, headache or visual disturbances   Complete by: As directed    Call MD for:  extreme fatigue   Complete by: As directed    Call MD for:  persistant dizziness or light-headedness   Complete by: As directed    Call MD for:  persistant nausea and vomiting   Complete by: As directed    Call MD for:  temperature >100.4   Complete by: As directed    Diet - low sodium heart healthy   Complete  by: As directed     Increase activity slowly   Complete by: As directed       Signed: Molli Hazard A, DO 08/06/2020, 10:58 AM   Pager: [phone]

## 2020-08-05 NOTE — NC FL2 (Signed)
[place] LEVEL OF CARE SCREENING TOOL     IDENTIFICATION  Patient Name: [name] Birthdate: May 29, 1966 Sex: female Admission Date (Current Location): 07/31/2020  [facility] and [place] Number:  Herbalist and Address:  [facility] [place], [place] [address], [place], [place] [address]      Provider Number: [id number]  Attending Physician Name and Address:  Oda Kilts, MD  Relative Name and Phone Number:  Estill Batten, daughter, [phone]    Current Level of Care: Hospital Recommended Level of Care: [place] Prior Approval Number:    Date Approved/Denied:   PASRR Number: [id number]  Discharge Plan: Home    Current Diagnoses: Patient Active Problem List   Diagnosis Date Noted  . Rectal bleeding   . Chronic anticoagulation   . Acute on chronic anemia 08/01/2020  . NSVT (nonsustained ventricular tachycardia) ([place])   . Chronic kidney disease (CKD), stage IV (severe) ([place]) 07/05/2020  . Acute on chronic heart failure ([place]) 07/04/2020  . Antithrombin III deficiency ([place]) 04/04/2020  . Elevated alkaline phosphatase level 04/04/2020  . DVT of right axillary vein, acute ([place]) 04/03/2020  . Iliopsoas bursitis of right hip 04/03/2020  . PAF (paroxysmal atrial fibrillation) ([place]) 03/22/2020  . Chronic kidney disease, stage 3b ([place]) 03/22/2020  . Anemia of chronic disease 03/20/2020  . Heart failure with reduced ejection fraction ([place]) 03/19/2020  . Multiple pulmonary nodules 11/14/2019  . Vitamin B 12 deficiency 10/11/2019  . Myalgia 10/11/2019  . Leg weakness, bilateral 08/19/2019  . GERD (gastroesophageal reflux disease) 01/27/2019  . Secondary hyperparathyroidism of renal origin ([place]) 06/11/2018  . Encounter for monitoring tacrolimus therapy 05/22/2018  . Hyperlipidemia 02/12/2017  . Preventative health care 08/12/2012  . Tobacco abuse 04/30/2012  . S/p cadaver renal transplant 11/19/2011  . Type II  diabetes mellitus, well controlled ([place]) 11/04/2011  . Acute kidney injury superimposed on chronic kidney disease ([place]) 11/03/2011  . HTN (hypertension) 11/03/2011    Orientation RESPIRATION BLADDER Height & Weight     Self,Time,Situation,Place  O2 (2L) External catheter Weight: 264 lb 8.8 oz (120 kg) Height:  5\' 11"  (180.3 cm)  BEHAVIORAL SYMPTOMS/MOOD NEUROLOGICAL BOWEL NUTRITION STATUS      Incontinent Diet  AMBULATORY STATUS COMMUNICATION OF NEEDS Skin   Extensive Assist Verbally Normal                       Personal Care Assistance Level of Assistance  Bathing,Feeding,Dressing Bathing Assistance: Maximum assistance Feeding assistance: Limited assistance Dressing Assistance: Maximum assistance     Functional Limitations Info  Sight,Hearing,Speech Sight Info: Adequate Hearing Info: Adequate Speech Info: Adequate    SPECIAL CARE FACTORS FREQUENCY  PT (By licensed PT),OT (By licensed OT)     PT Frequency: 5x weekly OT Frequency: 5x weekly            Contractures Contractures Info: Not present    Additional Factors Info  Code Status,Allergies,Psychotropic,Insulin Sliding Scale Code Status Info: Full Allergies Info: Infed (Iron Dextran), Cefazolin, Ancef (Cefazolin Sodium), Lisinopril, Other Psychotropic Info: Cymbalta Insulin Sliding Scale Info: See discharge summary       Current Medications (08/05/2020):  This is the current hospital active medication list Current Facility-Administered Medications  Medication Dose Route Frequency Provider Last Rate Last Admin  . 0.9 %  sodium chloride infusion (Manually program via Guardrails IV Fluids)   Intravenous Once  Ada, MD      . acetaminophen (TYLENOL) tablet 650 mg  650 mg Oral Q6H PRN Rana Hochstein Ada, MD      . amiodarone (PACERONE) tablet 200 mg  200 mg Oral Daily Niraj Kudrna Ada, MD   200 mg at 08/05/20 1144  . atorvastatin (LIPITOR) tablet 40 mg  40 mg Oral QHS Evrett Hakim Ada, MD   40 mg at 08/04/20  2157  . calcitRIOL (ROCALTROL) capsule 0.5 mcg  0.5 mcg Oral BID Dandrea Widdowson Ada, MD   0.5 mcg at 08/05/20 1140  . calcium-vitamin D (OSCAL WITH D) 500-200 MG-UNIT per tablet 1 tablet  1 tablet Oral TID England Greb Ada, MD   1 tablet at 08/05/20 1143  . DULoxetine (CYMBALTA) DR capsule 20 mg  20 mg Oral Daily Elizabeth Paulsen Ada, MD   20 mg at 08/05/20 1110  . guaiFENesin-dextromethorphan (ROBITUSSIN DM) 100-10 MG/5ML syrup 5 mL  5 mL Oral Q4H PRN Goro Wenrick Ada, MD   5 mL at 08/04/20 0921  . isosorbide-hydrALAZINE (BIDIL) 20-37.5 MG per tablet 1.5 tablet  1.5 tablet Oral TID Lilinoe Acklin Ada, MD   1.5 tablet at 08/05/20 1140  . metoprolol succinate (TOPROL-XL) 24 hr tablet 25 mg  25 mg Oral Daily Pavielle Biggar Ada, MD   25 mg at 08/05/20 1143  . mycophenolate (MYFORTIC) EC tablet 540 mg  540 mg Oral BID Dalal Livengood Ada, MD   540 mg at 08/05/20 1111  . pantoprazole (PROTONIX) EC tablet 40 mg  40 mg Oral Daily Alante Weimann Ada, MD   40 mg at 08/05/20 1145  . polyethylene glycol (MIRALAX / GLYCOLAX) packet 17 g  17 g Oral Daily PRN Aeon Kessner Ada, MD      . predniSONE (DELTASONE) tablet 5 mg  5 mg Oral Q breakfast Randon Somera Ada, MD   5 mg at 08/05/20 1144  . sevelamer carbonate (RENVELA) tablet 800 mg  800 mg Oral TID WC Wiley Magan Ada, MD   800 mg at 08/05/20 1143  . sodium chloride flush (NS) 0.9 % injection 3 mL  3 mL Intravenous Q12H Derwin Reddy Ada, MD   3 mL at 08/04/20 2158  . tacrolimus (PROGRAF) capsule 5 mg  5 mg Oral BID Benjerman Molinelli Ada, MD   5 mg at 08/05/20 1112  . torsemide (DEMADEX) tablet 80 mg  80 mg Oral Daily Deandre Stansel Ada, MD      . vitamin B-12 (CYANOCOBALAMIN) tablet 1,000 mcg  1,000 mcg Oral Daily Carmon Sahli Ada, MD   1,000 mcg at 08/05/20 1142     Discharge Medications: Please see discharge summary for a list of discharge medications.  Relevant Imaging Results:  Relevant Lab Results:   Additional Information SSN: [id number]. Only one Pfizer COVID-19 Vaccine on 03/24/2020  Archie Endo,  LCSW

## 2020-08-05 NOTE — Progress Notes (Signed)
CSW spoke with Juliann Pulse at [facility] who states the patient can return to the facility tomorrow.  CSW spoke with Dr. Sharon Seller to request a new COVID swab before she returns to [facility].  Madilyn Fireman, MSW, LCSW-A Transitions of Care  Clinical Social Worker I [phone]

## 2020-08-05 NOTE — Progress Notes (Addendum)
Subjective: Intermittent events: patient had clear BM following bowel prep  Patient seen just waking up. Reports doing well, breathing well. Denies abdominal pain. No noted bleeding. Awaiting Colonoscopy and Endoscopy today.   Objective:  Vital signs in last 24 hours: Vitals:   08/04/20 1202 08/04/20 1651 08/04/20 2317 08/05/20 0519  BP: 119/71 (!) 154/69 (!) 159/69 (!) 158/71  Pulse: 63 62 61 (!) 59  Resp: $Remo'18 18 19 20  'rpMOu$ Temp: 97.6 F (36.4 C) 98.2 F (36.8 C) 97.7 F (36.5 C) 97.8 F (36.6 C)  TempSrc: Oral Oral Oral Oral  SpO2: 100% 100% 100% 100%  Weight:    119.9 kg  Height:       Weight change: 1.2 kg  Intake/Output Summary (Last 24 hours) at 08/05/2020 0938 Last data filed at 08/05/2020 1829 Gross per 24 hour  Intake 1000 ml  Output 1500 ml  Net -500 ml   Physical Exam Constitutional:      General: She is not in acute distress.    Appearance: She is obese. She is not ill-appearing, toxic-appearing or diaphoretic.     Comments: Pleasant lady seen lying comfortably in bed. NAD. Reinholds in place.   Cardiovascular:     Rate and Rhythm: Normal rate and regular rhythm.  Pulmonary:     Effort: Pulmonary effort is normal. No respiratory distress.     Breath sounds: Examination of the right-lower field reveals rales. Examination of the left-lower field reveals rales. Rales present.  Abdominal:     Palpations: Abdomen is soft.     Tenderness: There is no abdominal tenderness.  Musculoskeletal:     Right lower leg: 1+ Pitting Edema present.     Left lower leg: 1+ Pitting Edema present.  Neurological:     Mental Status: She is alert.  Psychiatric:        Mood and Affect: Mood normal.        Behavior: Behavior normal.      Assessment/Plan:  Principal Problem:   Acute on chronic anemia Active Problems:   S/p cadaver renal transplant   Secondary hyperparathyroidism of renal origin ([place])   Heart failure with reduced ejection fraction (HCC)   PAF (paroxysmal atrial  fibrillation) (HCC)   Rectal bleeding   Chronic anticoagulation  [name] is a 55 yo female with hx of HTN, CHF, A-fib, T2DM, renal failure s/p cadaver renal transplantin 2011(on prograf, myfortic, and chronic steroids) now with CKD Stage V, chronic normocytic anemia, antithrombin III deficiency complicated by DVT (on eliquis) admitted for anemia with Hgb 5.3.Received2 units of RBCs on admission, with appropriate rise in Hgb. Hgb subsequently dropped to 6.5, requiring 2 additional units. Hemoglobin has remained stable since. Scheduled for endoscopy and colonoscopy today.  Anemia of chronic disease Thrombocytopenia Patient presentedwith severe anemia with Hgb 5.3. Baseline Hgb around 7-8 for the past year. During prior admission earlier this month, noted to have Hgb down to 6.8 requiring pRBCs with appropriate response. Started onweeklyAranesp by nephrology on 07/15/20.Patient received 2 units of RBCs, with appropriate rise in Hgb to 7.4.Hgb subsequently dropped to 6.5, with GI consequently consulted.Retic count low, suggesting hypoproliferation, with thrombocytopenia which is chronic.Teardrop cells noted on blood smear per Tech review. Patient s/p 4 pRBCs units over the course of this hospitalization, Hgb remains stable; patient clinically appropriate. Clear bowel movement overnight following bowel prep.   -Appreciate GI recs:plans forcolonoscopy and endoscopy today -Give torsemide dose today following procedures -Appreciate Hem/Onc recs: BMB in-patient or out-patient if endoscopy unremarkable.  -  Heparin per pharmacy -trend CBC -transfuse for Hgb <7 and/or active bleeding -continue B12 -holdingferrous sulfate  Renal failure s/p cadaver renal transplant in 2011 On immunosuppression with prograf, myfortic, and chronic prednisone CKD Stage V On prograf 5mg  BID, myfortic 540mg  BID, and prednisone 5mg  daily for immunosuppression.BUN remains elevated though downtrending;  phosphorustrending down from admission 6.5 post starting binder. Tacrolimus level 2.2 on 1/25.   -Nephrology following -continue home myfortic, prograf, and prednisone -Tacrolimus level pending -monitor UOP -avoid nephrotoxic medications  Secondary hyperparathyroidism from CKD Hypocalcemia Hypokalemia Calcium of 5.9 on admission. Corrected calcium ~6.7.Secondary hyperparathyroidism also thought to be the cause of her elevated alk phos (had negative multiple myeloma workup in the past). Elevated phosphorus likely contributing, which has been down trending following starting binder. Calcium levels appropriately rising with supplementation.  -AppreciateNephrology -800 Renvela TID -calcitriol 0.5 BID;calcium-vitamin D TID -trend BMP  Hypokalemia Hypomagnesemia K 3.1on admission. Chronically low.Improved with repletion.  -trend BMP -Repleteif required  Chronic biventricular heart failure Atrial fibrillation (on eliquis) HTN HLD On 2L O2 at baseline Recent hospital admission for HF exacerbation s/p extensive diuresis (~60 lbs). ECHO during prior admission showing EF 25-30% with mild LV dilation, severe LVH, moderately decreased RV function/mild RV enlargement, moderate aortic insuffficiency. Patient does have 1-2+ pitting edema in bilateral lower extremities, but will need to be cautious with diuresis given CKD stage V. Most recent dry weight appears to be around 273 lbs. Lower extremity a bit more edematous today.   -Holdingeliquis 5mg  BID in light of potential GI bleed. Heparinper pharmacy. -Continue torsemide 80mg  daily for HF  -continue bidil 1.5 tabs TID, toprol XL 25mg  daily for HTN -continue amiodarone 200mg  daily  -continue lipitor 40mg  daily for HLD -strict I/Os, daily weights, fluid restriction -avoid ACEI, ARB, spironolactone, digoxin given CKD stage V   Hx of DVT Antithrombin III deficiency RUE doppler during prior admission showing age-indeterminate  DVT of right axillary vein and age-indeterminate superficial vein thrombosis of right cephalic vein, unchanged from priors. ATIII levels found to be 57% of normal in 03/2020, thought to be due to nephrotic syndrome. -Holdingeliquis 5mg  BIDin light of potential GI bleed. Heparinper pharmacy. -SCDs   GERD Continue home protonix 40mg  daily  Dep[place] Home Cymbaltadose was decreased to 20 mg daily in light of renal clearance. Will continue to taper off.    Disposition: D/c pending EGD/colonoscopy findings.    LOS: 3 days   Azell Der, Medical Student 08/05/2020, 7:22 AM   Attestation for Student Documentation:  I personally was present and performed or re-performed the history, physical exam and medical decision-making activities of this service and have verified that the service and findings are accurately documented in the student's note.  Catalena Stanhope A, DO 08/05/2020, 3:14 PM

## 2020-08-06 ENCOUNTER — Encounter (HOSPITAL_COMMUNITY): Payer: Self-pay | Admitting: Gastroenterology

## 2020-08-06 DIAGNOSIS — Z66 Do not resuscitate: Secondary | ICD-10-CM | POA: Diagnosis not present

## 2020-08-06 DIAGNOSIS — N2581 Secondary hyperparathyroidism of renal origin: Secondary | ICD-10-CM | POA: Diagnosis present

## 2020-08-06 DIAGNOSIS — I313 Pericardial effusion (noninflammatory): Secondary | ICD-10-CM | POA: Diagnosis present

## 2020-08-06 DIAGNOSIS — D84821 Immunodeficiency due to drugs: Secondary | ICD-10-CM | POA: Diagnosis present

## 2020-08-06 DIAGNOSIS — N179 Acute kidney failure, unspecified: Secondary | ICD-10-CM | POA: Diagnosis present

## 2020-08-06 DIAGNOSIS — D649 Anemia, unspecified: Secondary | ICD-10-CM | POA: Diagnosis not present

## 2020-08-06 DIAGNOSIS — I10 Essential (primary) hypertension: Secondary | ICD-10-CM | POA: Diagnosis not present

## 2020-08-06 DIAGNOSIS — Z7901 Long term (current) use of anticoagulants: Secondary | ICD-10-CM | POA: Diagnosis not present

## 2020-08-06 DIAGNOSIS — J9602 Acute respiratory failure with hypercapnia: Secondary | ICD-10-CM | POA: Diagnosis not present

## 2020-08-06 DIAGNOSIS — N186 End stage renal disease: Secondary | ICD-10-CM | POA: Diagnosis present

## 2020-08-06 DIAGNOSIS — M25561 Pain in right knee: Secondary | ICD-10-CM | POA: Diagnosis not present

## 2020-08-06 DIAGNOSIS — R042 Hemoptysis: Secondary | ICD-10-CM | POA: Diagnosis present

## 2020-08-06 DIAGNOSIS — Z79899 Other long term (current) drug therapy: Secondary | ICD-10-CM | POA: Diagnosis not present

## 2020-08-06 DIAGNOSIS — Z7401 Bed confinement status: Secondary | ICD-10-CM | POA: Diagnosis not present

## 2020-08-06 DIAGNOSIS — G931 Anoxic brain damage, not elsewhere classified: Secondary | ICD-10-CM | POA: Diagnosis not present

## 2020-08-06 DIAGNOSIS — E1122 Type 2 diabetes mellitus with diabetic chronic kidney disease: Secondary | ICD-10-CM | POA: Diagnosis not present

## 2020-08-06 DIAGNOSIS — D696 Thrombocytopenia, unspecified: Secondary | ICD-10-CM | POA: Diagnosis not present

## 2020-08-06 DIAGNOSIS — E1165 Type 2 diabetes mellitus with hyperglycemia: Secondary | ICD-10-CM | POA: Diagnosis not present

## 2020-08-06 DIAGNOSIS — E119 Type 2 diabetes mellitus without complications: Secondary | ICD-10-CM | POA: Diagnosis not present

## 2020-08-06 DIAGNOSIS — I5043 Acute on chronic combined systolic (congestive) and diastolic (congestive) heart failure: Secondary | ICD-10-CM | POA: Diagnosis not present

## 2020-08-06 DIAGNOSIS — D631 Anemia in chronic kidney disease: Secondary | ICD-10-CM | POA: Diagnosis not present

## 2020-08-06 DIAGNOSIS — A4159 Other Gram-negative sepsis: Secondary | ICD-10-CM | POA: Diagnosis not present

## 2020-08-06 DIAGNOSIS — D61818 Other pancytopenia: Secondary | ICD-10-CM | POA: Diagnosis present

## 2020-08-06 DIAGNOSIS — Z86718 Personal history of other venous thrombosis and embolism: Secondary | ICD-10-CM | POA: Diagnosis not present

## 2020-08-06 DIAGNOSIS — I509 Heart failure, unspecified: Secondary | ICD-10-CM | POA: Diagnosis not present

## 2020-08-06 DIAGNOSIS — R42 Dizziness and giddiness: Secondary | ICD-10-CM | POA: Diagnosis not present

## 2020-08-06 DIAGNOSIS — I482 Chronic atrial fibrillation, unspecified: Secondary | ICD-10-CM | POA: Diagnosis not present

## 2020-08-06 DIAGNOSIS — R0603 Acute respiratory distress: Secondary | ICD-10-CM | POA: Diagnosis not present

## 2020-08-06 DIAGNOSIS — I480 Paroxysmal atrial fibrillation: Secondary | ICD-10-CM | POA: Diagnosis not present

## 2020-08-06 DIAGNOSIS — E874 Mixed disorder of acid-base balance: Secondary | ICD-10-CM | POA: Diagnosis present

## 2020-08-06 DIAGNOSIS — Y830 Surgical operation with transplant of whole organ as the cause of abnormal reaction of the patient, or of later complication, without mention of misadventure at the time of the procedure: Secondary | ICD-10-CM | POA: Diagnosis present

## 2020-08-06 DIAGNOSIS — J1108 Influenza due to unidentified influenza virus with specified pneumonia: Secondary | ICD-10-CM | POA: Diagnosis not present

## 2020-08-06 DIAGNOSIS — G894 Chronic pain syndrome: Secondary | ICD-10-CM | POA: Diagnosis not present

## 2020-08-06 DIAGNOSIS — R5381 Other malaise: Secondary | ICD-10-CM | POA: Diagnosis not present

## 2020-08-06 DIAGNOSIS — Z87891 Personal history of nicotine dependence: Secondary | ICD-10-CM | POA: Diagnosis not present

## 2020-08-06 DIAGNOSIS — E785 Hyperlipidemia, unspecified: Secondary | ICD-10-CM | POA: Diagnosis not present

## 2020-08-06 DIAGNOSIS — R6521 Severe sepsis with septic shock: Secondary | ICD-10-CM | POA: Diagnosis not present

## 2020-08-06 DIAGNOSIS — J9601 Acute respiratory failure with hypoxia: Secondary | ICD-10-CM | POA: Diagnosis not present

## 2020-08-06 DIAGNOSIS — R531 Weakness: Secondary | ICD-10-CM | POA: Diagnosis not present

## 2020-08-06 DIAGNOSIS — I5020 Unspecified systolic (congestive) heart failure: Secondary | ICD-10-CM | POA: Diagnosis not present

## 2020-08-06 DIAGNOSIS — M6281 Muscle weakness (generalized): Secondary | ICD-10-CM | POA: Diagnosis not present

## 2020-08-06 DIAGNOSIS — N189 Chronic kidney disease, unspecified: Secondary | ICD-10-CM | POA: Diagnosis not present

## 2020-08-06 DIAGNOSIS — N184 Chronic kidney disease, stage 4 (severe): Secondary | ICD-10-CM

## 2020-08-06 DIAGNOSIS — K625 Hemorrhage of anus and rectum: Secondary | ICD-10-CM | POA: Diagnosis not present

## 2020-08-06 DIAGNOSIS — T8619 Other complication of kidney transplant: Secondary | ICD-10-CM | POA: Diagnosis present

## 2020-08-06 DIAGNOSIS — M170 Bilateral primary osteoarthritis of knee: Secondary | ICD-10-CM | POA: Diagnosis not present

## 2020-08-06 DIAGNOSIS — R630 Anorexia: Secondary | ICD-10-CM | POA: Diagnosis not present

## 2020-08-06 DIAGNOSIS — I472 Ventricular tachycardia: Secondary | ICD-10-CM | POA: Diagnosis not present

## 2020-08-06 DIAGNOSIS — Z7409 Other reduced mobility: Secondary | ICD-10-CM | POA: Diagnosis not present

## 2020-08-06 DIAGNOSIS — Z940 Kidney transplant status: Secondary | ICD-10-CM | POA: Diagnosis not present

## 2020-08-06 DIAGNOSIS — M7989 Other specified soft tissue disorders: Secondary | ICD-10-CM | POA: Diagnosis not present

## 2020-08-06 DIAGNOSIS — D638 Anemia in other chronic diseases classified elsewhere: Secondary | ICD-10-CM | POA: Diagnosis not present

## 2020-08-06 DIAGNOSIS — D6832 Hemorrhagic disorder due to extrinsic circulating anticoagulants: Secondary | ICD-10-CM | POA: Diagnosis present

## 2020-08-06 DIAGNOSIS — N185 Chronic kidney disease, stage 5: Secondary | ICD-10-CM | POA: Diagnosis not present

## 2020-08-06 DIAGNOSIS — A4189 Other specified sepsis: Secondary | ICD-10-CM | POA: Diagnosis not present

## 2020-08-06 DIAGNOSIS — I5023 Acute on chronic systolic (congestive) heart failure: Secondary | ICD-10-CM | POA: Diagnosis not present

## 2020-08-06 DIAGNOSIS — K219 Gastro-esophageal reflux disease without esophagitis: Secondary | ICD-10-CM | POA: Diagnosis not present

## 2020-08-06 DIAGNOSIS — J1282 Pneumonia due to coronavirus disease 2019: Secondary | ICD-10-CM | POA: Diagnosis present

## 2020-08-06 DIAGNOSIS — Z794 Long term (current) use of insulin: Secondary | ICD-10-CM | POA: Diagnosis not present

## 2020-08-06 DIAGNOSIS — I5041 Acute combined systolic (congestive) and diastolic (congestive) heart failure: Secondary | ICD-10-CM | POA: Diagnosis not present

## 2020-08-06 DIAGNOSIS — G9341 Metabolic encephalopathy: Secondary | ICD-10-CM | POA: Diagnosis present

## 2020-08-06 DIAGNOSIS — U071 COVID-19: Secondary | ICD-10-CM | POA: Diagnosis present

## 2020-08-06 DIAGNOSIS — M25569 Pain in unspecified knee: Secondary | ICD-10-CM | POA: Diagnosis not present

## 2020-08-06 DIAGNOSIS — M2550 Pain in unspecified joint: Secondary | ICD-10-CM | POA: Diagnosis not present

## 2020-08-06 DIAGNOSIS — D6859 Other primary thrombophilia: Secondary | ICD-10-CM | POA: Diagnosis present

## 2020-08-06 DIAGNOSIS — I132 Hypertensive heart and chronic kidney disease with heart failure and with stage 5 chronic kidney disease, or end stage renal disease: Secondary | ICD-10-CM | POA: Diagnosis present

## 2020-08-06 DIAGNOSIS — I110 Hypertensive heart disease with heart failure: Secondary | ICD-10-CM | POA: Diagnosis not present

## 2020-08-06 DIAGNOSIS — J90 Pleural effusion, not elsewhere classified: Secondary | ICD-10-CM | POA: Diagnosis not present

## 2020-08-06 LAB — CBC
HCT: 24.8 % — ABNORMAL LOW (ref 36.0–46.0)
Hemoglobin: 7.8 g/dL — ABNORMAL LOW (ref 12.0–15.0)
MCH: 29.1 pg (ref 26.0–34.0)
MCHC: 31.5 g/dL (ref 30.0–36.0)
MCV: 92.5 fL (ref 80.0–100.0)
Platelets: 114 10*3/uL — ABNORMAL LOW (ref 150–400)
RBC: 2.68 MIL/uL — ABNORMAL LOW (ref 3.87–5.11)
RDW: 16.9 % — ABNORMAL HIGH (ref 11.5–15.5)
WBC: 6.7 10*3/uL (ref 4.0–10.5)
nRBC: 0 % (ref 0.0–0.2)

## 2020-08-06 LAB — RENAL FUNCTION PANEL
Albumin: 2.9 g/dL — ABNORMAL LOW (ref 3.5–5.0)
Anion gap: 15 (ref 5–15)
BUN: 92 mg/dL — ABNORMAL HIGH (ref 6–20)
CO2: 24 mmol/L (ref 22–32)
Calcium: 6.3 mg/dL — CL (ref 8.9–10.3)
Chloride: 104 mmol/L (ref 98–111)
Creatinine, Ser: 3.66 mg/dL — ABNORMAL HIGH (ref 0.44–1.00)
GFR, Estimated: 14 mL/min — ABNORMAL LOW (ref >60)
Glucose, Bld: 142 mg/dL — ABNORMAL HIGH (ref 70–99)
Phosphorus: 5.3 mg/dL — ABNORMAL HIGH (ref 2.5–4.6)
Potassium: 3.7 mmol/L (ref 3.5–5.1)
Sodium: 143 mmol/L (ref 135–145)

## 2020-08-06 LAB — FOLATE RBC
Folate, Hemolysate: 284 ng/mL
Folate, RBC: 1150 ng/mL (ref >498)
Hematocrit: 24.7 % — ABNORMAL LOW (ref 34.0–46.6)

## 2020-08-06 LAB — MAGNESIUM: Magnesium: 1.8 mg/dL (ref 1.7–2.4)

## 2020-08-06 LAB — SARS CORONAVIRUS 2 (TAT 6-24 HRS): SARS Coronavirus 2: NEGATIVE

## 2020-08-06 LAB — TACROLIMUS LEVEL: Tacrolimus (FK506) - LabCorp: 10.6 ng/mL (ref 2.0–20.0)

## 2020-08-06 MED ORDER — CALCITRIOL 0.5 MCG PO CAPS: 0.5000 ug | ORAL_CAPSULE | Freq: Two times a day (BID) | ORAL | 5 refills | Status: AC

## 2020-08-06 MED ORDER — SEVELAMER CARBONATE 800 MG PO TABS: 800.0000 mg | ORAL_TABLET | Freq: Three times a day (TID) | ORAL | Status: AC

## 2020-08-06 MED ORDER — CALCIUM GLUCONATE-NACL 1-0.675 GM/50ML-% IV SOLN
1.0000 g | Freq: Once | INTRAVENOUS | Status: AC
  Administered 2020-08-06: 1000 mg via INTRAVENOUS
  Filled 2020-08-06 (×2): qty 50

## 2020-08-06 MED ORDER — CALCIUM CARBONATE-VITAMIN D 500-200 MG-UNIT PO TABS: 1.0000 | ORAL_TABLET | Freq: Three times a day (TID) | ORAL | 5 refills | Status: AC

## 2020-08-06 MED ORDER — ISOSORB DINITRATE-HYDRALAZINE 20-37.5 MG PO TABS: 1.5000 | ORAL_TABLET | Freq: Three times a day (TID) | ORAL | 5 refills | Status: AC

## 2020-08-06 MED ORDER — DULOXETINE HCL 20 MG PO CPEP: ORAL_CAPSULE | ORAL | 0 refills | Status: AC

## 2020-08-06 NOTE — Progress Notes (Addendum)
Patient will return to [facility] today as soon as her COVID test results. The patient will go to room 216 and she will be transported via [facility]. [facility] has been requested for pick up at 4pm. The number to call for report is [phone].  CSW sent discharge summary via HUB to [place] at [facility].  CSW spoke with patient to inform her of discharge plan, she is agreeable.  Madilyn Fireman, MSW, LCSW-A Transitions of Care  Clinical Social Worker I [phone]

## 2020-08-06 NOTE — Progress Notes (Signed)
CRITICAL VALUE ALERT  Critical Value:  Calcium 6.3  Date & Time Notied:  08/07/19 0142  Provider Notified: Dr Bevelyn Buckles  Orders Received/Actions taken:none

## 2020-08-06 NOTE — Progress Notes (Signed)
Reviewed tacrolimus level 1/26 which is ok at 4.7.  Cr stable this AM at 3.66, electrolytes ok.  She is discharging today.  I messaged scheduler yesterday to ensure she has a follow up with Dr. Marval Regal at [facility] in the next 2-4 weeks.  She will need outpt ESA arranged so I will also initiate that process.

## 2020-08-06 NOTE — Progress Notes (Signed)
Subjective:   Patient states that she feels well this morning. States that her breathing feels good, but sometimes feels like she needs her supplemental oxygen. This is her baseline. She is agreeable to discharge back to SNF today.  Objective:  Vital signs in last 24 hours: Vitals:   08/05/20 1213 08/05/20 1816 08/05/20 2359 08/06/20 0506  BP: (!) 172/79 (!) 156/79 (!) 168/77 (!) 146/64  Pulse: 60 66 63 62  Resp: $Remo'16 16 19 20  'aSWUC$ Temp: (!) 97.3 F (36.3 C) 97.9 F (36.6 C) 98.7 F (37.1 C) 98.5 F (36.9 C)  TempSrc: Oral Oral Oral Oral  SpO2: 100% 97% 96% 100%  Weight:    118.2 kg  Height:       Physical Exam Constitutional:      General: She is not in acute distress.    Appearance: She is obese. She is not ill-appearing, toxic-appearing or diaphoretic.     Comments: lying comfortably in bed. NAD. [place] in place.   Cardiovascular:     Rate and Rhythm: Normal rate and regular rhythm.  Pulmonary:     Effort: Pulmonary effort is normal. No respiratory distress.     Breath sounds: Examination of the right-lower field reveals rales. Examination of the left-lower field reveals rales. Rales present.  Abdominal:     Palpations: Abdomen is soft.     Tenderness: There is no abdominal tenderness.  Musculoskeletal:     Right lower leg: 1+ Pitting Edema present.     Left lower leg: 1+ Pitting Edema present.  Neurological:     Mental Status: She is alert.  Psychiatric:        Mood and Affect: Mood normal.        Behavior: Behavior normal.   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 Hgb subsequently dropped to 6.5, requiring 2 additional units. Hemoglobin has remained stable since. Scheduled for endoscopy and colonoscopytoday.  Anemia of chronic disease Thrombocytopenia Patient presentedwith severe anemia with Hgb 5.3. Baseline 7-8. Has required transfusion in the past. Started onweeklyAranesp by nephrology on 07/15/20.Now s/p 4U, hgb has remained stable. Also has hypoproliferation, teardrop cells and small amt of schistocytes on smear.   - EGD and colonoscopy yesterday with GI were normal. - Heme-onc consulted and will plan to do outpatient bone marrow biopsy  Renal failure s/p cadaver renal transplant in 2011 On immunosuppression with prograf, myfortic, and chronic prednisone CKD Stage V On prograf $RemoveBe'5mg'XtoFskLii$  BID, myfortic $RemoveBefor'540mg'sumLOpppyzZQ$  BID, and prednisone $RemoveBefore'5mg'dBVWaxThhcqpK$  daily for immunosuppression.BUN remains elevated though downtrending; phosphorustrending down from admission 6.5 post starting binder. Tacrolimus level 2.2 on 1/25.   -Nephrology following -continue home myfortic, prograf, and prednisone -Tacrolimus level normal -monitor UOP -avoid nephrotoxic medications -tapering off cymbalta  Secondary hyperparathyroidism from CKD Hypocalcemia Hypokalemia Secondary hyperparathyroidism also thought to be the cause of her elevated alk phos (had negative multiple myeloma workup in the past).Elevated phosphoruslikely  contributing, which has been down trendingfollowing starting binder.Calcium levels appropriately rising with supplementation although decreased again today. She is asymptomatic.   -IV Ca gluconate once for corrected Ca of 6.7.  -AppreciateNephrology -800 Renvela TID -calcitriol 0.5 BID;calcium-vitamin D TID -trend  BMP  Chronic biventricular heart failure Atrial fibrillation (on eliquis) HTN HLD On 2L O2 at baseline Recent hospital admission for HF exacerbation s/p extensive diuresis (~60 lbs).  EF 25-30% with mild LV dilation, severe LVH, moderately decreased RV function/mild RV enlargement, moderate aortic insuffficiency. Patient does have 1-2+ pitting edema in bilateral lower extremities, but will need to be cautious with diuresis given CKD stage V. No increased shortness of breath today.   -Continue torsemide 80mg  daily for HF  -continue bidil 1.5 tabs TID, toprol XL 25mg  daily for HTN -continue amiodarone 200mg  daily  -continue lipitor 40mg  daily for HLD -strict I/Os, daily weights, fluid restriction -avoid ACEI, ARB, spironolactone, digoxin given CKD stage V  Hx of DVT Antithrombin III deficiency RUE doppler during prior admission showing age-indeterminate DVT of right axillary vein and age-indeterminate superficial vein thrombosis of right cephalic vein, unchanged from priors. ATIII levels found to be 57% of normal in 03/2020, thought to be due to nephrotic syndrome. -Holdingeliquis 5mg  BIDin light of potential GI bleed. Heparinper pharmacy. -SCDs  GERD Continue home protonix 40mg  daily  [place] Home Cymbaltadose was decreased to 20 mg daily in light of renal clearance. Will continue to taper off.    Disposition: d/c back to SNF today    Tracey [name] A, DO 08/06/2020, 7:20 AM Pager: [phone] After 5pm on weekdays and 1pm on weekends: On Call Pager: [phone]

## 2020-08-07 LAB — KAPPA/LAMBDA LIGHT CHAINS
Kappa free light chain: 108.9 mg/L — ABNORMAL HIGH (ref 3.3–19.4)
Kappa, lambda light chain ratio: 1.41 (ref 0.26–1.65)
Lambda free light chains: 77.1 mg/L — ABNORMAL HIGH (ref 5.7–26.3)

## 2020-08-07 LAB — PROTEIN ELECTROPHORESIS, SERUM
A/G Ratio: 1.4 (ref 0.7–1.7)
Albumin ELP: 3 g/dL (ref 2.9–4.4)
Alpha-1-Globulin: 0.2 g/dL (ref 0.0–0.4)
Alpha-2-Globulin: 0.5 g/dL (ref 0.4–1.0)
Beta Globulin: 0.7 g/dL (ref 0.7–1.3)
Gamma Globulin: 0.7 g/dL (ref 0.4–1.8)
Globulin, Total: 2.1 g/dL — ABNORMAL LOW (ref 2.2–3.9)
Total Protein ELP: 5.1 g/dL — ABNORMAL LOW (ref 6.0–8.5)

## 2020-08-08 ENCOUNTER — Telehealth: Payer: Self-pay | Admitting: Internal Medicine

## 2020-08-08 ENCOUNTER — Telehealth: Payer: Self-pay | Admitting: Hematology and Oncology

## 2020-08-08 NOTE — Telephone Encounter (Signed)
TOC HFU APPT [facility] 08/14/2020 @ 9:15 AM WITH DR Allyson Sabal.

## 2020-08-08 NOTE — Telephone Encounter (Signed)
Scheduled appt per 12/8 sch msg - pt is aware of appt date and time   

## 2020-08-09 ENCOUNTER — Other Ambulatory Visit: Payer: Self-pay | Admitting: *Deleted

## 2020-08-09 DIAGNOSIS — M170 Bilateral primary osteoarthritis of knee: Secondary | ICD-10-CM | POA: Diagnosis not present

## 2020-08-09 DIAGNOSIS — Z940 Kidney transplant status: Secondary | ICD-10-CM | POA: Diagnosis not present

## 2020-08-09 DIAGNOSIS — R630 Anorexia: Secondary | ICD-10-CM | POA: Diagnosis not present

## 2020-08-09 DIAGNOSIS — M25561 Pain in right knee: Secondary | ICD-10-CM | POA: Diagnosis not present

## 2020-08-09 DIAGNOSIS — N184 Chronic kidney disease, stage 4 (severe): Secondary | ICD-10-CM | POA: Diagnosis not present

## 2020-08-09 DIAGNOSIS — G894 Chronic pain syndrome: Secondary | ICD-10-CM | POA: Diagnosis not present

## 2020-08-09 NOTE — Patient Outreach (Signed)
[facility] Post- Acute Care Coordinator follow up. Member screened for potential [facility] Care Management needs.  Update received from [place] indicating member remains in SNF and is participating with therapy. SW reports transition plan is to return home with daughter.   Telephone call made to [name] [phone]. No answer. HIPAA compliant voicemail message left requesting return call.   [name] goes to [facility]. This practice has a [facility] Embedded Chronic Care Management team.   Will continue to follow while member resides in SNF.    Marthenia Rolling, MSN, RN,BSN [place] Acute Care Coordinator [phone] [facility]) [phone]  (Toll free office)

## 2020-08-10 DIAGNOSIS — I480 Paroxysmal atrial fibrillation: Secondary | ICD-10-CM | POA: Diagnosis not present

## 2020-08-10 DIAGNOSIS — N185 Chronic kidney disease, stage 5: Secondary | ICD-10-CM | POA: Diagnosis not present

## 2020-08-10 DIAGNOSIS — I10 Essential (primary) hypertension: Secondary | ICD-10-CM | POA: Diagnosis not present

## 2020-08-11 NOTE — Telephone Encounter (Signed)
Transition Care Management Unsuccessful Follow-up Telephone Call  Date of discharge and from where: 08/06/20  Attempts:  1st Attempt  Reason for unsuccessful TCM follow-up call:  Patient is in SNF

## 2020-08-14 ENCOUNTER — Encounter: Payer: Medicare Other | Admitting: Student

## 2020-08-14 ENCOUNTER — Encounter: Payer: Self-pay | Admitting: Student

## 2020-08-14 ENCOUNTER — Ambulatory Visit (INDEPENDENT_AMBULATORY_CARE_PROVIDER_SITE_OTHER): Payer: Medicare Other | Admitting: Student

## 2020-08-14 ENCOUNTER — Other Ambulatory Visit: Payer: Self-pay | Admitting: *Deleted

## 2020-08-14 ENCOUNTER — Other Ambulatory Visit: Payer: Self-pay

## 2020-08-14 VITALS — BP 131/78 | HR 72 | Temp 97.4°F | Ht 71.0 in | Wt 236.0 lb

## 2020-08-14 DIAGNOSIS — N184 Chronic kidney disease, stage 4 (severe): Secondary | ICD-10-CM

## 2020-08-14 DIAGNOSIS — K625 Hemorrhage of anus and rectum: Secondary | ICD-10-CM

## 2020-08-14 DIAGNOSIS — D631 Anemia in chronic kidney disease: Secondary | ICD-10-CM | POA: Diagnosis not present

## 2020-08-14 DIAGNOSIS — D649 Anemia, unspecified: Secondary | ICD-10-CM

## 2020-08-14 NOTE — Patient Instructions (Signed)
[name],  It was a pleasure seeing you in the clinic today.   We discussed the following today:  1. We are getting some labs to check your hemoglobin and your kidney function. I will give you a call if any are abnormal.  2. Continue taking your eliquis to prevent blood clots from forming.   Please call our clinic at [phone] if you have any questions or concerns. The best time to call is Monday-Friday from 9am-4pm, but there is someone available 24/7 at the same number. If you need medication refills, please notify your pharmacy one week in advance and they will send [place] a request.   Thank you for letting us take part in your care. We look forward to seeing you next time!

## 2020-08-14 NOTE — Patient Outreach (Signed)
[facility] Post- Acute Care Coordinator follow up. Member screened for potential [facility] Care Management needs.  Telephone call made to [name] [phone]. Patient identifiers confirmed. [name] reports she remains in [facility] SNF. States she will likely remain in SNF another week or two.   Explained to [name] that her PCP office has Chronic Care Management team. Discussed that [facility] [place] has attempted to reach her in the past. Explained that writer will continue to follow while she remains in SNF. Discussed that Probation officer will plan outreach again closer to SNF dc. [name] states she plans to return home with her daughter.   Update sent to [place].   Will continue to follow while member resides in SNF.    Marthenia Rolling, MSN, RN,BSN [place] Acute Care Coordinator [phone] [facility]) [phone]  (Toll free office)

## 2020-08-14 NOTE — Progress Notes (Signed)
   CC: hospital f/u  HPI:  [name] is a 55 y.o. F with history listed below and recent hospitalization for rectal bleeding presenting to the [facility] for hospital f/u. Please see individualized A&P for full HPI.  Past Medical History:  Diagnosis Date  . Acute heart failure ([place]) 06/2020  . Blood transfusion without reported diagnosis   . CHF (congestive heart failure) ([place])   . 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 GERD (gastroesophageal reflux disease)    medication induced  . Hypertension   . Renal failure    hx of  . 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  Physical Exam:  Vitals:   08/14/20 0933 08/14/20 0942 08/14/20 1016  BP: (!) 166/81 (!) 161/78 131/78  Pulse: 76  72  Temp: (!) 97.4 F (36.3 C)    TempSrc: Oral    SpO2: 100%    Weight: 236 lb (107 kg)    Height: 5\' 11"  (1.803 m)     Physical Exam Constitutional:      Appearance: She is obese. She is not ill-appearing.  HENT:     Head: Normocephalic and atraumatic.     Mouth/Throat:     Mouth: Mucous membranes are moist.     Pharynx: Oropharynx is clear. No oropharyngeal exudate.  Eyes:     Extraocular Movements: Extraocular movements intact.     Conjunctiva/sclera: Conjunctivae normal.     Pupils: Pupils are equal, round, and reactive to light.  Cardiovascular:     Rate and Rhythm: Normal rate and regular rhythm.     Pulses: Normal pulses.     Heart sounds: Normal heart sounds. No murmur heard. No friction rub. No gallop.   Pulmonary:     Effort: Pulmonary effort is normal.     Breath sounds: Normal breath sounds. No wheezing, rhonchi or  rales.  Abdominal:     General: Bowel sounds are normal. There is no distension.     Palpations: Abdomen is soft.     Tenderness: There is no abdominal tenderness.  Musculoskeletal:     Cervical back: Normal range of motion.     Comments: Unilateral swelling of RUE, chronic. Limited ROM in R shoulder, but otherwise normal.  Skin:    General: Skin is warm and dry.  Neurological:     General: No focal deficit present.     Mental Status: She is alert. Mental status is at baseline.  Psychiatric:        Mood and Affect: Mood normal.        Behavior: Behavior normal.      Assessment & Plan:   See Encounters Tab for problem based charting.  Patient discussed with Dr. Heber [place]

## 2020-08-14 NOTE — Assessment & Plan Note (Addendum)
Patient recently hospitalized for acute on chronic anemia. Hgb was 5.3 on admission. GI performed EGD and colonoscopy with normal findings, with plan to obtain video capsule endoscopy should patient become symptomatically anemic again. Also scheduled with Hematology to obtain bone marrow biopsy to r/o insidious causes of symptomatic anemia given patient history and lab findings of some schistocytes and tear drop cells (although this is likely 2/2 to chronic kidney disease). As per discharge note, patient's Hgb stabilized and she was restarted on anticoagulation with eliquis due to hx of DVT (right axillary v) and PAF. Patient here today for hospital follow up to obtain CBC and CMP to assess hemoglobin levels and to monitor kidney function. On exam, patient not complaining of any symptoms aside from mild fatigue. No episodes of N/V, hematemesis, chest pain, SHOB, abdominal pain, diarrhea, constipation, hematochezia, melena, changes in urination, fevers, chills.   -f/u CBC and CMP -if hemoglobin <7, will refer to ED for further evaluation -discussed with patient to call clinic or 911 should she have any more episodes of rectal bleeding or symptoms of anemia  ADDENDUM:  CBC with Hgb 8.4, which is stable. CMP showing improving kidney function. Will continue with anticoagulation with eliquis as patient's Hgb appears to have stabilized.

## 2020-08-14 NOTE — Assessment & Plan Note (Addendum)
Obtaining CMP today.  -f/u results   ADDENDUM: Calcium 6.8. Patient has recent history of hypocalcemia thought to be 2/2 secondary hyperparathyroidism in the setting of her CKD. During recent hospitalization, nephrology recommended supplementing with calcitriol 0.5mg  BID and oscal TID, which patient was discharged with. She is also on a phosphate binder as well. Nothing to do for this hypocalcemia as patient has been asymptomatic. Continue with close follow up with nephrology for her CKD.

## 2020-08-15 LAB — CBC WITH DIFFERENTIAL/PLATELET
Basophils Absolute: 0 10*3/uL (ref 0.0–0.2)
Basos: 0 %
EOS (ABSOLUTE): 0.1 10*3/uL (ref 0.0–0.4)
Eos: 1 %
Hematocrit: 26.2 % — ABNORMAL LOW (ref 34.0–46.6)
Hemoglobin: 8.4 g/dL — ABNORMAL LOW (ref 11.1–15.9)
Immature Grans (Abs): 0 10*3/uL (ref 0.0–0.1)
Immature Granulocytes: 0 %
Lymphocytes Absolute: 0.3 10*3/uL — ABNORMAL LOW (ref 0.7–3.1)
Lymphs: 4 %
MCH: 28.8 pg (ref 26.6–33.0)
MCHC: 32.1 g/dL (ref 31.5–35.7)
MCV: 90 fL (ref 79–97)
Monocytes Absolute: 0.4 10*3/uL (ref 0.1–0.9)
Monocytes: 5 %
Neutrophils Absolute: 7 10*3/uL (ref 1.4–7.0)
Neutrophils: 90 %
Platelets: 144 10*3/uL — ABNORMAL LOW (ref 150–450)
RBC: 2.92 x10E6/uL — ABNORMAL LOW (ref 3.77–5.28)
RDW: 16.2 % — ABNORMAL HIGH (ref 11.7–15.4)
WBC: 7.8 10*3/uL (ref 3.4–10.8)

## 2020-08-15 LAB — CMP14 + ANION GAP
ALT: 12 IU/L (ref 0–32)
AST: 13 IU/L (ref 0–40)
Albumin/Globulin Ratio: 1.9 (ref 1.2–2.2)
Albumin: 3.7 g/dL — ABNORMAL LOW (ref 3.8–4.9)
Alkaline Phosphatase: 601 IU/L — ABNORMAL HIGH (ref 44–121)
Anion Gap: 22 mmol/L — ABNORMAL HIGH (ref 10.0–18.0)
BUN/Creatinine Ratio: 20 (ref 9–23)
BUN: 62 mg/dL — ABNORMAL HIGH (ref 6–24)
Bilirubin Total: 0.5 mg/dL (ref 0.0–1.2)
CO2: 19 mmol/L — ABNORMAL LOW (ref 20–29)
Calcium: 6.8 mg/dL — CL (ref 8.7–10.2)
Chloride: 102 mmol/L (ref 96–106)
Creatinine, Ser: 3.15 mg/dL — ABNORMAL HIGH (ref 0.57–1.00)
GFR calc Af Amer: 18 mL/min/{1.73_m2} — ABNORMAL LOW (ref >59)
GFR calc non Af Amer: 16 mL/min/{1.73_m2} — ABNORMAL LOW (ref >59)
Globulin, Total: 1.9 g/dL (ref 1.5–4.5)
Glucose: 125 mg/dL — ABNORMAL HIGH (ref 65–99)
Potassium: 4 mmol/L (ref 3.5–5.2)
Sodium: 143 mmol/L (ref 134–144)
Total Protein: 5.6 g/dL — ABNORMAL LOW (ref 6.0–8.5)

## 2020-08-16 NOTE — Progress Notes (Signed)
Internal Medicine Clinic Attending  Case discussed with Dr. Jinwala  At the time of the visit.  We reviewed the resident's history and exam and pertinent patient test results.  I agree with the assessment, diagnosis, and plan of care documented in the resident's note.  

## 2020-08-17 ENCOUNTER — Inpatient Hospital Stay: Payer: Medicare Other | Admitting: Pulmonary Disease

## 2020-08-17 ENCOUNTER — Inpatient Hospital Stay: Payer: Medicare Other | Attending: Hematology and Oncology | Admitting: Hematology and Oncology

## 2020-08-17 ENCOUNTER — Other Ambulatory Visit: Payer: Self-pay

## 2020-08-17 ENCOUNTER — Encounter: Payer: Self-pay | Admitting: Hematology and Oncology

## 2020-08-17 ENCOUNTER — Telehealth: Payer: Self-pay | Admitting: Hematology and Oncology

## 2020-08-17 VITALS — BP 138/70 | HR 69 | Temp 98.6°F | Resp 20 | Ht 71.0 in

## 2020-08-17 DIAGNOSIS — Z79899 Other long term (current) drug therapy: Secondary | ICD-10-CM | POA: Diagnosis not present

## 2020-08-17 DIAGNOSIS — N189 Chronic kidney disease, unspecified: Secondary | ICD-10-CM | POA: Insufficient documentation

## 2020-08-17 DIAGNOSIS — I509 Heart failure, unspecified: Secondary | ICD-10-CM | POA: Insufficient documentation

## 2020-08-17 DIAGNOSIS — D696 Thrombocytopenia, unspecified: Secondary | ICD-10-CM | POA: Insufficient documentation

## 2020-08-17 DIAGNOSIS — I110 Hypertensive heart disease with heart failure: Secondary | ICD-10-CM | POA: Diagnosis not present

## 2020-08-17 DIAGNOSIS — Z794 Long term (current) use of insulin: Secondary | ICD-10-CM | POA: Diagnosis not present

## 2020-08-17 DIAGNOSIS — Z7901 Long term (current) use of anticoagulants: Secondary | ICD-10-CM | POA: Insufficient documentation

## 2020-08-17 DIAGNOSIS — Z87891 Personal history of nicotine dependence: Secondary | ICD-10-CM | POA: Diagnosis not present

## 2020-08-17 DIAGNOSIS — D649 Anemia, unspecified: Secondary | ICD-10-CM | POA: Insufficient documentation

## 2020-08-17 DIAGNOSIS — E1165 Type 2 diabetes mellitus with hyperglycemia: Secondary | ICD-10-CM | POA: Diagnosis not present

## 2020-08-17 DIAGNOSIS — Z7409 Other reduced mobility: Secondary | ICD-10-CM | POA: Diagnosis not present

## 2020-08-17 DIAGNOSIS — R42 Dizziness and giddiness: Secondary | ICD-10-CM | POA: Diagnosis not present

## 2020-08-17 DIAGNOSIS — N184 Chronic kidney disease, stage 4 (severe): Secondary | ICD-10-CM | POA: Diagnosis not present

## 2020-08-17 DIAGNOSIS — R531 Weakness: Secondary | ICD-10-CM | POA: Diagnosis not present

## 2020-08-17 DIAGNOSIS — M25569 Pain in unspecified knee: Secondary | ICD-10-CM | POA: Insufficient documentation

## 2020-08-17 DIAGNOSIS — Z940 Kidney transplant status: Secondary | ICD-10-CM | POA: Diagnosis not present

## 2020-08-17 NOTE — Telephone Encounter (Signed)
Scheduled appointments per 2/10 los. Spoke to patient who is aware of appointments date and times. Gave patient calendar print out.  

## 2020-08-17 NOTE — Progress Notes (Signed)
[name] NOTE  Patient Care Team: [name], DO as PCP - General Dominic Pea, DO as Referring Physician (Internal Medicine)  CHIEF COMPLAINTS/PURPOSE OF CONSULTATION:  Abnormal labs.  ASSESSMENT & PLAN:  No problem-specific Assessment & Plan notes found for this encounter.  Orders Placed This Encounter  Procedures  . CBC with Differential/Platelet    Standing Status:   Future    Standing Expiration Date:   08/17/2021   1.  Normocytic normochromic anemia likely related to anemia of chronic kidney disease.   No evidence of bleeding on endoscopy and colonoscopy.  She is responding well to darbepoetin.  Encouraged to continue darbepoetin until hemoglobin is at least 10 to 11g. I reviewed the labs for the past 2 years, she has had normocytic normochromic anemia for the past 2 years which has not changed significantly hence I do not believe this is a bone marrow issue.  I have offered bone marrow biopsy if the labs change a significantly on repeat blood work. 2.  Mild thrombocytopenia again likely related to acute hospitalization, improving.  No evidence of nutritional deficiency. Given the mild thrombocytopenia, encouraged repeating CBC in 8 weeks and follow-up with me. 3.  Severe hypercalcemia, slightly improved compared to the most recent hospitalization.  She is on oral calcium and vitamin D supplementation.  Encouraged her to contact her nephrologist immediately to discussed significantly low calcium levels and see if she can qualify for some IV calcium.  I have discussed that hypocalcemia can cause tetany and also can lead to abnormal cardiac rhythms if left untreated which can occasionally be fatal.  She expressed understanding and she will call her nephrologist today. Return to clinic in 8 weeks with repeat labs. Please not hesitate to contact us with any additional questions or concerns.  HISTORY OF PRESENTING ILLNESS:  [name] 55 y.o.  female is here because of anemia and thrombocytopenia.  [name] 55 y.o. female is here because of abnormal labs.  Patient tells me that she was a rehab facility and she had some labs which showed hemoglobin of 5.  She was transferred to the hospital because of severe anemia.  She says she felt winded and very tired but otherwise has not noticed any changes. No bleeding in her stool or black colored stool. No blood in her urine.  She had upper GI endoscopy which showed normal esophagus, stomach and duodenum. Colonoscopy showed normal colon. There is no evidence of bleeding.  She is now here for follow-up after hospital discharge. Complains of some knee pain today.  She has started taking the darbepoetin injections.  She denies any bleeding complaints.  No change in breathing or urinary habits.   REVIEW OF SYSTEMS:   Constitutional: Denies fevers, chills or abnormal night sweats Eyes: Denies blurriness of vision, double vision or watery eyes Ears, nose, mouth, throat, and face: Denies mucositis or sore throat Respiratory: Denies cough, dyspnea or wheezes Cardiovascular: Denies palpitation, chest discomfort or lower extremity swelling Gastrointestinal:  Denies nausea, heartburn or change in bowel habits Skin: Denies abnormal skin rashes Lymphatics: Denies new lymphadenopathy or easy bruising Neurological:Denies numbness, tingling or new weaknesses Behavioral/Psych: Mood is stable, no new changes  All other systems were reviewed with the patient and are negative.  MEDICAL HISTORY:  Past Medical History:  Diagnosis Date  . Acute heart failure ([place]) 06/2020  . Blood transfusion without reported diagnosis   . CHF (congestive heart failure) ([place])   . 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 GERD (gastroesophageal reflux disease)    medication induced  . Hypertension   . Renal failure    hx of  . 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 [place]  . CHOLECYSTECTOMY  2008  . COLONOSCOPY  10 years ago   Dr.Hung="normal"  . COLONOSCOPY WITH PROPOFOL N/A 08/05/2020   Procedure: COLONOSCOPY WITH PROPOFOL;  Surgeon: Carol Ada, MD;  Location: [facility];  Service: Endoscopy;  Laterality: N/A;  . DG AV DIALYSIS  SHUNT ACCESS EXIST*L* OR    . ESOPHAGOGASTRODUODENOSCOPY (EGD) WITH PROPOFOL N/A 08/05/2020   Procedure: ESOPHAGOGASTRODUODENOSCOPY (EGD) WITH PROPOFOL;  Surgeon: Carol Ada, MD;  Location: [facility];  Service: Endoscopy;  Laterality: N/A;  . KIDNEY TRANSPLANT  2011  . UMBILICAL HERNIA REPAIR  2009    SOCIAL HISTORY: Social History   Socioeconomic History  . Marital status: Single    Spouse name: Not on file  . Number of children: 2  . Years of education: 40  . Highest education level: Not on file  Occupational History    Employer: UNEMPLOYED  Tobacco Use  . Smoking status: Former Smoker    Packs/day: 0.50    Types: Cigarettes  . Smokeless tobacco: Never Used  . Tobacco comment: 10 some days   Vaping Use  . Vaping Use: Never used  Substance and Sexual Activity  . Alcohol use: Never    Alcohol/week: 0.0 standard drinks  . Drug use: Never  . Sexual activity: Not Currently  Other Topics Concern  . 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 Difficulty of Paying Living Expenses: Somewhat hard  Food Insecurity: Food Insecurity Present  . Worried About Charity fundraiser in the Last Year: Sometimes true  . Ran Out of Food in the Last Year: Sometimes true  Transportation Needs: No Transportation Needs  . Lack of Transportation (Medical): No  . Lack of Transportation (Non-Medical): No  Physical Activity: Inactive  . Days of Exercise per Week: 0 days  . Minutes of Exercise per Session: 0 min  Stress: Not on file  Social Connections: Unknown  . Frequency of Communication with Friends and Family: More than three times a week  . Frequency of Social Gatherings with Friends and Family: More than three times a week  . Attends Religious Services: Not on file  . Active Member of Clubs or Organizations: Not on file  . Attends Archivist Meetings: Not on file  . Marital Status: Not on file  Intimate Partner Violence: Not on file    FAMILY HISTORY: Family History  Problem Relation Age of Onset  . Hypertension  Mother   . Diabetes Mother   . Atrial fibrillation Father   . Hypertension Father   . Arthritis Father   . Heart failure Sister   . Hypertension Sister        all 4 sisters  . Hypertension Brother        both brothers  . Diabetes Brother   . Colon cancer Neg Hx   . Stomach cancer Neg Hx     ALLERGIES:  is allergic to infed [iron dextran], cefazolin, ancef [cefazolin sodium], lisinopril, and other.  MEDICATIONS:  Current Outpatient Medications  Medication Sig Dispense Refill  . acetaminophen (TYLENOL) 325 MG tablet Take 2 tablets (650 mg total) by mouth every 6 (six) hours as needed for mild pain (or Fever >/= 101). 50 tablet 1  . albuterol (PROVENTIL) (2.5 MG/3ML) 0.083% nebulizer solution Take 3 mLs (2.5 mg total) by nebulization every 6 (six) hours as needed for wheezing or shortness of breath. 75 mL 12  . amiodarone (PACERONE) 200 MG  tablet Take 1 tablet (200 mg total) by mouth daily. 30 tablet 0  . apixaban (ELIQUIS) 5 MG TABS tablet Take 1 tablet (5 mg total) by mouth 2 (two) times daily. 60 tablet 4  . atorvastatin (LIPITOR) 40 MG tablet Take 1 tablet (40 mg total) by mouth daily. (Patient taking differently: Take 40 mg by mouth at bedtime.) 90 tablet 3  . calcitRIOL (ROCALTROL) 0.5 MCG capsule Take 1 capsule (0.5 mcg total) by mouth 2 (two) times daily. 60 capsule 5  . calcium-vitamin D (OSCAL WITH D) 500-200 MG-UNIT tablet Take 1 tablet by mouth 3 (three) times daily. 90 tablet 5  . Darbepoetin Alfa (ARANESP) 60 MCG/0.3ML SOSY injection Inject 0.3 mLs (60 mcg total) into the skin every Saturday at 6 PM. 4.2 mL   . DULoxetine (CYMBALTA) 20 MG capsule Take one 20 mg tablet for one week, then take 1/2 tablet for two weeks, then stop medication 30 capsule 0  . ferrous sulfate 325 (65 FE) MG tablet Take 1 tablet (325 mg total) by mouth 2 (two) times daily with a meal. 60 tablet 3  . guaiFENesin-dextromethorphan (ROBITUSSIN DM) 100-10 MG/5ML syrup Take 5 mLs by mouth every 4 (four) hours as needed for cough. 118 mL 0  . insulin aspart (NOVOLOG) 100 UNIT/ML injection Inject 0-20 Units into the skin 3 (three) times daily with meals. 10 mL 11  . isosorbide-hydrALAZINE (BIDIL) 20-37.5 MG tablet Take 1.5 tablets by mouth 3 (three) times daily. 135 tablet 5  . loperamide (IMODIUM) 2 MG capsule Take 2 capsules (4 mg total) by mouth daily as needed for diarrhea or loose stools. 30 capsule 0  . loratadine (CLARITIN) 10 MG tablet Take 1 tablet (10 mg total) by mouth daily. (Patient taking differently: Take 10 mg by mouth daily as needed for allergies.) 90 tablet 3  . metoprolol succinate (TOPROL-XL) 25 MG 24 hr tablet Take 1 tablet (25 mg total) by mouth daily. 30 tablet 3  . mycophenolate (MYFORTIC) 180 MG EC tablet TAKE 3 TABLETS BY MOUTH TWICE DAILY (Patient taking differently: Take 540 mg by mouth 2 (two) times daily.) 540 tablet 0  .  omeprazole (PRILOSEC) 40 MG capsule Take 40 mg by mouth daily.    . predniSONE (DELTASONE) 5 MG tablet Take 1 tablet (5 mg total) by mouth daily with breakfast.    . sevelamer carbonate (RENVELA) 800 MG tablet Take 1 tablet (800 mg total) by mouth 3 (three) times daily with meals.    [name]  tacrolimus (PROGRAF) 5 MG capsule Take 1 capsule (5 mg total) by mouth 2 (two) times daily.    [name] torsemide (DEMADEX) 20 MG tablet Take 4 tablets (80 mg total) by mouth daily.    [name] triamcinolone ointment (KENALOG) 0.1 % Apply 1 application topically 2 (two) times daily as needed (rash).    . vitamin B-12 (CYANOCOBALAMIN) 1000 MCG tablet Take 1 tablet (1,000 mcg total) by mouth daily. 30 tablet 3   No current facility-administered medications for this visit.     PHYSICAL EXAMINATION: ECOG PERFORMANCE STATUS: 0 - Asymptomatic  Vitals:   08/17/20 0935  BP: 138/70  Pulse: 69  Resp: 20  Temp: 98.6 F (37 C)  SpO2: 100%   Filed Weights    GENERAL:alert, no distress and comfortable Neck: supple, no palpable LN Chest: Clear to auscultation bilaterally except for bibasilar crepitations Heart: RRR Abdomen: Soft, BS normal. Extremities: 2+ edema.  LABORATORY DATA:  I have reviewed the data as listed Lab Results  Component Value Date   WBC 7.8 08/14/2020   HGB 8.4 (L) 08/14/2020   HCT 26.2 (L) 08/14/2020   MCV 90 08/14/2020   PLT 144 (L) 08/14/2020     Chemistry      Component Value Date/Time   NA 143 08/14/2020 1034   K 4.0 08/14/2020 1034   CL 102 08/14/2020 1034   CO2 19 (L) 08/14/2020 1034   BUN 62 (H) 08/14/2020 1034   CREATININE 3.15 (H) 08/14/2020 1034   CREATININE 1.43 (H) 02/28/2014 1059      Component Value Date/Time   CALCIUM 6.8 (LL) 08/14/2020 1034   CALCIUM 8.5 (L) 06/04/2018 0403   ALKPHOS 601 (H) 08/14/2020 1034   AST 13 08/14/2020 1034   ALT 12 08/14/2020 1034   BILITOT 0.5 08/14/2020 1034     Anemia appears chronic upon review of labs for the past 2  yrs Thrombocytopenia mild and new onset. SPEP negative K/L ratio normal. B12 levels are normal. RBC folate normal. Ferritin of 255. Retic count of 1.8%.   I reviewed her most recent labs which showed improvement in hemoglobin and platelet count  RADIOGRAPHIC STUDIES: I have personally reviewed the radiological images as listed and agreed with the findings in the report. No results found.  All questions were answered. The patient knows to call the clinic with any problems, questions or concerns. I spent 20 minutes in the care of this patient including H and P, review of records, counseling and coordination of care.     Benay Pike, MD 08/17/2020 10:04 AM

## 2020-08-18 ENCOUNTER — Other Ambulatory Visit: Payer: Self-pay | Admitting: *Deleted

## 2020-08-21 DIAGNOSIS — E1122 Type 2 diabetes mellitus with diabetic chronic kidney disease: Secondary | ICD-10-CM | POA: Diagnosis not present

## 2020-08-21 DIAGNOSIS — I10 Essential (primary) hypertension: Secondary | ICD-10-CM | POA: Diagnosis not present

## 2020-08-21 DIAGNOSIS — Z940 Kidney transplant status: Secondary | ICD-10-CM | POA: Diagnosis not present

## 2020-08-21 DIAGNOSIS — Z7901 Long term (current) use of anticoagulants: Secondary | ICD-10-CM | POA: Diagnosis not present

## 2020-08-21 DIAGNOSIS — N184 Chronic kidney disease, stage 4 (severe): Secondary | ICD-10-CM | POA: Diagnosis not present

## 2020-08-21 DIAGNOSIS — I480 Paroxysmal atrial fibrillation: Secondary | ICD-10-CM | POA: Diagnosis not present

## 2020-08-21 DIAGNOSIS — U071 COVID-19: Secondary | ICD-10-CM | POA: Diagnosis not present

## 2020-08-21 NOTE — Discharge Instructions (Signed)

## 2020-08-22 ENCOUNTER — Emergency Department (HOSPITAL_COMMUNITY): Payer: Medicare Other

## 2020-08-22 ENCOUNTER — Encounter (HOSPITAL_COMMUNITY): Payer: Self-pay | Admitting: Emergency Medicine

## 2020-08-22 ENCOUNTER — Encounter (HOSPITAL_COMMUNITY): Payer: Self-pay

## 2020-08-22 ENCOUNTER — Other Ambulatory Visit: Payer: Self-pay

## 2020-08-22 ENCOUNTER — Inpatient Hospital Stay (HOSPITAL_COMMUNITY)
Admission: RE | Admit: 2020-08-22 | Discharge: 2020-08-22 | Disposition: A | Discharge status: Home / Self Care | Payer: Medicare Other | Source: Ambulatory Visit | Attending: Nephrology | Admitting: Nephrology

## 2020-08-22 ENCOUNTER — Inpatient Hospital Stay (HOSPITAL_COMMUNITY)
Admission: EM | Admit: 2020-08-22 | Discharge: 2020-09-05 | DRG: 207 | Disposition: E | Payer: Medicare Other | Attending: Internal Medicine | Admitting: Internal Medicine

## 2020-08-22 DIAGNOSIS — R6521 Severe sepsis with septic shock: Secondary | ICD-10-CM | POA: Diagnosis not present

## 2020-08-22 DIAGNOSIS — Z794 Long term (current) use of insulin: Secondary | ICD-10-CM

## 2020-08-22 DIAGNOSIS — J811 Chronic pulmonary edema: Secondary | ICD-10-CM

## 2020-08-22 DIAGNOSIS — Z7901 Long term (current) use of anticoagulants: Secondary | ICD-10-CM

## 2020-08-22 DIAGNOSIS — Z888 Allergy status to other drugs, medicaments and biological substances status: Secondary | ICD-10-CM

## 2020-08-22 DIAGNOSIS — Z6836 Body mass index (BMI) 36.0-36.9, adult: Secondary | ICD-10-CM

## 2020-08-22 DIAGNOSIS — N179 Acute kidney failure, unspecified: Secondary | ICD-10-CM | POA: Diagnosis present

## 2020-08-22 DIAGNOSIS — I5043 Acute on chronic combined systolic (congestive) and diastolic (congestive) heart failure: Secondary | ICD-10-CM | POA: Diagnosis not present

## 2020-08-22 DIAGNOSIS — Z789 Other specified health status: Secondary | ICD-10-CM

## 2020-08-22 DIAGNOSIS — A4159 Other Gram-negative sepsis: Secondary | ICD-10-CM | POA: Diagnosis not present

## 2020-08-22 DIAGNOSIS — G931 Anoxic brain damage, not elsewhere classified: Secondary | ICD-10-CM | POA: Diagnosis not present

## 2020-08-22 DIAGNOSIS — Z66 Do not resuscitate: Secondary | ICD-10-CM | POA: Diagnosis not present

## 2020-08-22 DIAGNOSIS — D61818 Other pancytopenia: Secondary | ICD-10-CM | POA: Diagnosis not present

## 2020-08-22 DIAGNOSIS — D649 Anemia, unspecified: Secondary | ICD-10-CM | POA: Diagnosis not present

## 2020-08-22 DIAGNOSIS — I5020 Unspecified systolic (congestive) heart failure: Secondary | ICD-10-CM | POA: Diagnosis present

## 2020-08-22 DIAGNOSIS — E874 Mixed disorder of acid-base balance: Secondary | ICD-10-CM | POA: Diagnosis present

## 2020-08-22 DIAGNOSIS — N186 End stage renal disease: Secondary | ICD-10-CM | POA: Diagnosis present

## 2020-08-22 DIAGNOSIS — Z8249 Family history of ischemic heart disease and other diseases of the circulatory system: Secondary | ICD-10-CM

## 2020-08-22 DIAGNOSIS — E8889 Other specified metabolic disorders: Secondary | ICD-10-CM | POA: Diagnosis present

## 2020-08-22 DIAGNOSIS — A4189 Other specified sepsis: Secondary | ICD-10-CM | POA: Diagnosis not present

## 2020-08-22 DIAGNOSIS — I088 Other rheumatic multiple valve diseases: Secondary | ICD-10-CM | POA: Diagnosis present

## 2020-08-22 DIAGNOSIS — J1282 Pneumonia due to coronavirus disease 2019: Secondary | ICD-10-CM | POA: Diagnosis not present

## 2020-08-22 DIAGNOSIS — Z86010 Personal history of colonic polyps: Secondary | ICD-10-CM

## 2020-08-22 DIAGNOSIS — Z4659 Encounter for fitting and adjustment of other gastrointestinal appliance and device: Secondary | ICD-10-CM

## 2020-08-22 DIAGNOSIS — G9341 Metabolic encephalopathy: Secondary | ICD-10-CM | POA: Diagnosis present

## 2020-08-22 DIAGNOSIS — D6832 Hemorrhagic disorder due to extrinsic circulating anticoagulants: Secondary | ICD-10-CM | POA: Diagnosis present

## 2020-08-22 DIAGNOSIS — T45515A Adverse effect of anticoagulants, initial encounter: Secondary | ICD-10-CM | POA: Diagnosis present

## 2020-08-22 DIAGNOSIS — J90 Pleural effusion, not elsewhere classified: Secondary | ICD-10-CM | POA: Diagnosis not present

## 2020-08-22 DIAGNOSIS — N250 Renal osteodystrophy: Secondary | ICD-10-CM | POA: Diagnosis present

## 2020-08-22 DIAGNOSIS — K219 Gastro-esophageal reflux disease without esophagitis: Secondary | ICD-10-CM | POA: Diagnosis present

## 2020-08-22 DIAGNOSIS — N2581 Secondary hyperparathyroidism of renal origin: Secondary | ICD-10-CM | POA: Diagnosis present

## 2020-08-22 DIAGNOSIS — I480 Paroxysmal atrial fibrillation: Secondary | ICD-10-CM | POA: Diagnosis present

## 2020-08-22 DIAGNOSIS — I5082 Biventricular heart failure: Secondary | ICD-10-CM | POA: Diagnosis present

## 2020-08-22 DIAGNOSIS — D631 Anemia in chronic kidney disease: Secondary | ICD-10-CM | POA: Diagnosis present

## 2020-08-22 DIAGNOSIS — U071 COVID-19: Secondary | ICD-10-CM | POA: Diagnosis not present

## 2020-08-22 DIAGNOSIS — J9601 Acute respiratory failure with hypoxia: Secondary | ICD-10-CM | POA: Diagnosis not present

## 2020-08-22 DIAGNOSIS — Z86718 Personal history of other venous thrombosis and embolism: Secondary | ICD-10-CM

## 2020-08-22 DIAGNOSIS — Z7952 Long term (current) use of systemic steroids: Secondary | ICD-10-CM

## 2020-08-22 DIAGNOSIS — R042 Hemoptysis: Secondary | ICD-10-CM | POA: Diagnosis present

## 2020-08-22 DIAGNOSIS — K6289 Other specified diseases of anus and rectum: Secondary | ICD-10-CM | POA: Diagnosis present

## 2020-08-22 DIAGNOSIS — E11649 Type 2 diabetes mellitus with hypoglycemia without coma: Secondary | ICD-10-CM | POA: Diagnosis not present

## 2020-08-22 DIAGNOSIS — D84821 Immunodeficiency due to drugs: Secondary | ICD-10-CM | POA: Diagnosis present

## 2020-08-22 DIAGNOSIS — Z87891 Personal history of nicotine dependence: Secondary | ICD-10-CM

## 2020-08-22 DIAGNOSIS — Z8261 Family history of arthritis: Secondary | ICD-10-CM

## 2020-08-22 DIAGNOSIS — J9602 Acute respiratory failure with hypercapnia: Secondary | ICD-10-CM | POA: Diagnosis not present

## 2020-08-22 DIAGNOSIS — I2510 Atherosclerotic heart disease of native coronary artery without angina pectoris: Secondary | ICD-10-CM | POA: Diagnosis present

## 2020-08-22 DIAGNOSIS — I313 Pericardial effusion (noninflammatory): Secondary | ICD-10-CM | POA: Diagnosis present

## 2020-08-22 DIAGNOSIS — J1108 Influenza due to unidentified influenza virus with specified pneumonia: Secondary | ICD-10-CM | POA: Diagnosis not present

## 2020-08-22 DIAGNOSIS — J9600 Acute respiratory failure, unspecified whether with hypoxia or hypercapnia: Secondary | ICD-10-CM

## 2020-08-22 DIAGNOSIS — E1122 Type 2 diabetes mellitus with diabetic chronic kidney disease: Secondary | ICD-10-CM | POA: Diagnosis present

## 2020-08-22 DIAGNOSIS — F32A Depression, unspecified: Secondary | ICD-10-CM | POA: Diagnosis present

## 2020-08-22 DIAGNOSIS — Z79899 Other long term (current) drug therapy: Secondary | ICD-10-CM

## 2020-08-22 DIAGNOSIS — I132 Hypertensive heart and chronic kidney disease with heart failure and with stage 5 chronic kidney disease, or end stage renal disease: Secondary | ICD-10-CM | POA: Diagnosis present

## 2020-08-22 DIAGNOSIS — D6859 Other primary thrombophilia: Secondary | ICD-10-CM | POA: Diagnosis present

## 2020-08-22 DIAGNOSIS — T8619 Other complication of kidney transplant: Secondary | ICD-10-CM | POA: Diagnosis present

## 2020-08-22 DIAGNOSIS — K567 Ileus, unspecified: Secondary | ICD-10-CM | POA: Diagnosis not present

## 2020-08-22 DIAGNOSIS — Z95828 Presence of other vascular implants and grafts: Secondary | ICD-10-CM

## 2020-08-22 DIAGNOSIS — R451 Restlessness and agitation: Secondary | ICD-10-CM | POA: Diagnosis present

## 2020-08-22 DIAGNOSIS — E669 Obesity, unspecified: Secondary | ICD-10-CM | POA: Diagnosis present

## 2020-08-22 DIAGNOSIS — E876 Hypokalemia: Secondary | ICD-10-CM | POA: Diagnosis not present

## 2020-08-22 DIAGNOSIS — Z0189 Encounter for other specified special examinations: Secondary | ICD-10-CM

## 2020-08-22 DIAGNOSIS — Z452 Encounter for adjustment and management of vascular access device: Secondary | ICD-10-CM

## 2020-08-22 DIAGNOSIS — Z9049 Acquired absence of other specified parts of digestive tract: Secondary | ICD-10-CM

## 2020-08-22 DIAGNOSIS — Y830 Surgical operation with transplant of whole organ as the cause of abnormal reaction of the patient, or of later complication, without mention of misadventure at the time of the procedure: Secondary | ICD-10-CM | POA: Diagnosis present

## 2020-08-22 DIAGNOSIS — Z833 Family history of diabetes mellitus: Secondary | ICD-10-CM

## 2020-08-22 LAB — URINALYSIS, ROUTINE W REFLEX MICROSCOPIC
Bilirubin Urine: NEGATIVE
Glucose, UA: NEGATIVE mg/dL
Ketones, ur: NEGATIVE mg/dL
Nitrite: NEGATIVE
Protein, ur: 100 mg/dL — AB
Specific Gravity, Urine: 1.011 (ref 1.005–1.030)
pH: 7 (ref 5.0–8.0)

## 2020-08-22 LAB — CBC WITH DIFFERENTIAL/PLATELET
Abs Immature Granulocytes: 0.02 10*3/uL (ref 0.00–0.07)
Basophils Absolute: 0 10*3/uL (ref 0.0–0.1)
Basophils Relative: 0 %
Eosinophils Absolute: 0 10*3/uL (ref 0.0–0.5)
Eosinophils Relative: 0 %
HCT: 19.4 % — ABNORMAL LOW (ref 36.0–46.0)
Hemoglobin: 6.1 g/dL — CL (ref 12.0–15.0)
Immature Granulocytes: 1 %
Lymphocytes Relative: 7 %
Lymphs Abs: 0.2 10*3/uL — ABNORMAL LOW (ref 0.7–4.0)
MCH: 30 pg (ref 26.0–34.0)
MCHC: 31.4 g/dL (ref 30.0–36.0)
MCV: 95.6 fL (ref 80.0–100.0)
Monocytes Absolute: 0.3 10*3/uL (ref 0.1–1.0)
Monocytes Relative: 12 %
Neutro Abs: 1.9 10*3/uL (ref 1.7–7.7)
Neutrophils Relative %: 80 %
Platelets: 81 10*3/uL — ABNORMAL LOW (ref 150–400)
RBC: 2.03 MIL/uL — ABNORMAL LOW (ref 3.87–5.11)
RDW: 17.2 % — ABNORMAL HIGH (ref 11.5–15.5)
WBC: 2.3 10*3/uL — ABNORMAL LOW (ref 4.0–10.5)
nRBC: 0 % (ref 0.0–0.2)

## 2020-08-22 LAB — COMPREHENSIVE METABOLIC PANEL
ALT: 14 U/L (ref 0–44)
AST: 15 U/L (ref 15–41)
Albumin: 3.1 g/dL — ABNORMAL LOW (ref 3.5–5.0)
Alkaline Phosphatase: 468 U/L — ABNORMAL HIGH (ref 38–126)
Anion gap: 15 (ref 5–15)
BUN: 86 mg/dL — ABNORMAL HIGH (ref 6–20)
CO2: 21 mmol/L — ABNORMAL LOW (ref 22–32)
Calcium: 5.7 mg/dL — CL (ref 8.9–10.3)
Chloride: 106 mmol/L (ref 98–111)
Creatinine, Ser: 3.98 mg/dL — ABNORMAL HIGH (ref 0.44–1.00)
GFR, Estimated: 13 mL/min — ABNORMAL LOW (ref >60)
Glucose, Bld: 109 mg/dL — ABNORMAL HIGH (ref 70–99)
Potassium: 3.9 mmol/L (ref 3.5–5.1)
Sodium: 142 mmol/L (ref 135–145)
Total Bilirubin: 0.2 mg/dL — ABNORMAL LOW (ref 0.3–1.2)
Total Protein: 5.4 g/dL — ABNORMAL LOW (ref 6.5–8.1)

## 2020-08-22 LAB — HEMOGLOBIN AND HEMATOCRIT, BLOOD
HCT: 23.7 % — ABNORMAL LOW (ref 36.0–46.0)
Hemoglobin: 7.4 g/dL — ABNORMAL LOW (ref 12.0–15.0)

## 2020-08-22 LAB — RESP PANEL BY RT-PCR (FLU A&B, COVID) ARPGX2
Influenza A by PCR: NEGATIVE
Influenza B by PCR: NEGATIVE
SARS Coronavirus 2 by RT PCR: POSITIVE — AB

## 2020-08-22 LAB — D-DIMER, QUANTITATIVE: D-Dimer, Quant: 3.35 ug/mL-FEU — ABNORMAL HIGH (ref 0.00–0.50)

## 2020-08-22 LAB — PREPARE RBC (CROSSMATCH)

## 2020-08-22 LAB — FERRITIN: Ferritin: 510 ng/mL — ABNORMAL HIGH (ref 11–307)

## 2020-08-22 LAB — FIBRINOGEN: Fibrinogen: 430 mg/dL (ref 210–475)

## 2020-08-22 LAB — LACTATE DEHYDROGENASE: LDH: 269 U/L — ABNORMAL HIGH (ref 98–192)

## 2020-08-22 LAB — BRAIN NATRIURETIC PEPTIDE: B Natriuretic Peptide: 2903.7 pg/mL — ABNORMAL HIGH (ref 0.0–100.0)

## 2020-08-22 MED ORDER — DARBEPOETIN ALFA 60 MCG/0.3ML IJ SOSY
60.0000 ug | PREFILLED_SYRINGE | INTRAMUSCULAR | Status: DC
  Administered 2020-08-26: 60 ug via SUBCUTANEOUS
  Filled 2020-08-22: qty 0.3

## 2020-08-22 MED ORDER — ALBUTEROL SULFATE HFA 108 (90 BASE) MCG/ACT IN AERS
2.0000 | INHALATION_SPRAY | Freq: Four times a day (QID) | RESPIRATORY_TRACT | Status: DC
  Administered 2020-08-22 – 2020-08-27 (×16): 2 via RESPIRATORY_TRACT
  Filled 2020-08-22: qty 6.7

## 2020-08-22 MED ORDER — TACROLIMUS 1 MG PO CAPS
5.0000 mg | ORAL_CAPSULE | Freq: Two times a day (BID) | ORAL | Status: DC
  Administered 2020-08-22 – 2020-08-27 (×10): 5 mg via ORAL
  Filled 2020-08-22 (×12): qty 5

## 2020-08-22 MED ORDER — CALCIUM GLUCONATE-NACL 1-0.675 GM/50ML-% IV SOLN
1.0000 g | Freq: Once | INTRAVENOUS | Status: AC
  Administered 2020-08-22: 1000 mg via INTRAVENOUS
  Filled 2020-08-22: qty 50

## 2020-08-22 MED ORDER — WITCH HAZEL-GLYCERIN EX PADS
MEDICATED_PAD | CUTANEOUS | Status: DC | PRN
  Filled 2020-08-22: qty 100

## 2020-08-22 MED ORDER — AMIODARONE HCL 200 MG PO TABS
200.0000 mg | ORAL_TABLET | Freq: Every day | ORAL | Status: DC
  Administered 2020-08-23 – 2020-08-27 (×5): 200 mg via ORAL
  Filled 2020-08-22 (×5): qty 1

## 2020-08-22 MED ORDER — PANTOPRAZOLE SODIUM 40 MG PO TBEC
40.0000 mg | DELAYED_RELEASE_TABLET | Freq: Every day | ORAL | Status: DC
  Administered 2020-08-23 – 2020-08-27 (×5): 40 mg via ORAL
  Filled 2020-08-22 (×5): qty 1

## 2020-08-22 MED ORDER — TORSEMIDE 20 MG PO TABS: 80.0000 mg | ORAL_TABLET | Freq: Every day | ORAL | Status: DC

## 2020-08-22 MED ORDER — SODIUM CHLORIDE 0.9 % IV SOLN
10.0000 mL/h | Freq: Once | INTRAVENOUS | Status: AC
  Administered 2020-08-22: 10 mL/h via INTRAVENOUS

## 2020-08-22 MED ORDER — LORATADINE 10 MG PO TABS: 10.0000 mg | ORAL_TABLET | Freq: Every day | ORAL | Status: DC | PRN

## 2020-08-22 MED ORDER — ATORVASTATIN CALCIUM 40 MG PO TABS
40.0000 mg | ORAL_TABLET | Freq: Every day | ORAL | Status: DC
  Administered 2020-08-23 – 2020-08-27 (×5): 40 mg via ORAL
  Filled 2020-08-22 (×5): qty 1

## 2020-08-22 MED ORDER — ALBUTEROL SULFATE (2.5 MG/3ML) 0.083% IN NEBU: 2.5000 mg | INHALATION_SOLUTION | Freq: Four times a day (QID) | RESPIRATORY_TRACT | Status: DC | PRN

## 2020-08-22 MED ORDER — CALCITRIOL 0.5 MCG PO CAPS
0.5000 ug | ORAL_CAPSULE | Freq: Two times a day (BID) | ORAL | Status: DC
  Administered 2020-08-22 – 2020-08-24 (×4): 0.5 ug via ORAL
  Filled 2020-08-22: qty 1
  Filled 2020-08-22 (×3): qty 2
  Filled 2020-08-22: qty 1
  Filled 2020-08-22: qty 2
  Filled 2020-08-22 (×2): qty 1

## 2020-08-22 MED ORDER — MYCOPHENOLATE SODIUM 180 MG PO TBEC: 540.0000 mg | DELAYED_RELEASE_TABLET | Freq: Two times a day (BID) | ORAL | Status: DC

## 2020-08-22 MED ORDER — VITAMIN B-12 1000 MCG PO TABS
1000.0000 ug | ORAL_TABLET | Freq: Every day | ORAL | Status: DC
  Administered 2020-08-23 – 2020-08-27 (×5): 1000 ug via ORAL
  Filled 2020-08-22 (×5): qty 1

## 2020-08-22 MED ORDER — METOPROLOL SUCCINATE ER 25 MG PO TB24
25.0000 mg | ORAL_TABLET | Freq: Every day | ORAL | Status: DC
  Administered 2020-08-23 – 2020-08-27 (×5): 25 mg via ORAL
  Filled 2020-08-22 (×5): qty 1

## 2020-08-22 MED ORDER — DEXAMETHASONE 6 MG PO TABS
6.0000 mg | ORAL_TABLET | ORAL | Status: DC
  Administered 2020-08-22 – 2020-08-27 (×6): 6 mg via ORAL
  Filled 2020-08-22 (×6): qty 1

## 2020-08-22 MED ORDER — FUROSEMIDE 10 MG/ML IJ SOLN
80.0000 mg | Freq: Once | INTRAMUSCULAR | Status: AC
  Administered 2020-08-22: 80 mg via INTRAVENOUS
  Filled 2020-08-22: qty 8

## 2020-08-22 MED ORDER — CALCIUM CARBONATE-VITAMIN D 500-200 MG-UNIT PO TABS
1.0000 | ORAL_TABLET | Freq: Three times a day (TID) | ORAL | Status: DC
  Administered 2020-08-22 – 2020-08-27 (×14): 1 via ORAL
  Filled 2020-08-22 (×16): qty 1

## 2020-08-22 MED ORDER — SEVELAMER CARBONATE 800 MG PO TABS
800.0000 mg | ORAL_TABLET | Freq: Three times a day (TID) | ORAL | Status: DC
  Administered 2020-08-23 – 2020-08-27 (×13): 800 mg via ORAL
  Filled 2020-08-22 (×14): qty 1

## 2020-08-22 MED ORDER — GUAIFENESIN-DM 100-10 MG/5ML PO SYRP
10.0000 mL | ORAL_SOLUTION | ORAL | Status: DC | PRN
  Administered 2020-08-22 – 2020-08-25 (×2): 10 mL via ORAL
  Filled 2020-08-22 (×2): qty 10

## 2020-08-22 NOTE — H&P (Addendum)
Date: 08/21/2020               Patient Name:  [name] MRN: [id number]  DOB: 09-09-1965 Age / Sex: 55 y.o., female   PCP: [name], DO         Medical Service: Internal Medicine Teaching Service         Attending Physician: Dr. Aldine Contes, MD    First Contact: Dr. Alfonse Spruce Pager: [phone]  Second Contact: Dr. 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 facility with acute on chronic anemia and hypocalcemia on labs. She also states she was diagnosed with covid two days ago.  She states she has been doing well since discharge. She followed up with clinic one week ago and her hemoglobin was at baseline. She has had increased SOB and has been using her supplemental oxygen since discharge whereas she usually uses it prn. She denies chest pain, dizziness, changes in vision, numbness. She has not had any dark stools or bright red blood per rectum. No nausea or vomiting. She does have some rectal pain and feels like she may have a sore on her sacrum.  She has had a mild non-productive cough, no sore throat, change in taste or smell, or sinus pain. She denies fever or chills. She denies nausea, abdominal pain, dysuria, difficulty with urination. She did previously have some increased swelling but feels like she is currently at her baseline and swelling has improved.   Social:   She no longer smokes, previously smoked 1ppd for 30 years She does not drink alcohol  She currently has been  staying at [facility]  Family History:   Father: chronic anemia requiring transfusions.   Family History  Problem Relation Age of Onset  . Hypertension Mother   . Diabetes Mother   . Atrial fibrillation Father   . Hypertension Father   . Arthritis Father   . Heart failure Sister   . Hypertension Sister        all 4 sisters  . Hypertension Brother        both brothers  . Diabetes Brother   . Colon cancer Neg Hx   . Stomach cancer Neg Hx      Meds:  No outpatient medications have been marked as taking for the 08/21/2020 encounter [facility]).     Allergies: Allergies as of 08/29/2020 - Review Complete 08/12/2020  Allergen Reaction Noted  . Infed [iron dextran] Other (See Comments) 12/09/2011  . Cefazolin Itching and Nausea And Vomiting 11/30/2015  . Ancef [cefazolin sodium] Other (See Comments) 11/03/2011  . Lisinopril Swelling 11/30/2015  . Other  11/03/2011   Past Medical History:  Diagnosis Date  . Acute heart failure ([place]) 06/2020  . Blood transfusion without reported diagnosis   . CHF (congestive heart failure) ([place])   . 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 GERD (gastroesophageal reflux disease)    medication  induced  . Hypertension   . Renal failure    hx of  . 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   Physical Exam: Blood pressure (!) 126/99, pulse 63, temperature 98.4 F (36.9 C), temperature source Oral, resp. rate 18, last menstrual period 11/05/2012, SpO2 100 %.  Constitution: NAD, sitting up in bed  HENT: /AT Eyes: no icterus or injection  Cardio: RRR, no m/r/g, JVD to  jaw, +1 pitting edema up to thighs, chronic appearing Respiratory: tachypnic with any movement, on 5L, crackles right lobe, wheezing bilaterally  Abdominal: normal BS, soft, NTTP, non-distended MSK: moving all extremities; RUE swollen, chronic  Neuro: alert & oriented, normal affect  Skin: dependent induration and edema   EKG: personally reviewed my interpretation is conduction delay, chronic, sinus rhythm  CXR: personally reviewed my interpretation is chronic bilateral pleural effusions, R>L; vascular congestion, cardiomegaly   Assessment & Plan by Problem: Active Problems:   Symptomatic anemia  [name] is a 55yo female with PMH of 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  Acute on Chronic Anemia Pancytopenia  Hgb 6.1, baseline ~8. Recently admitted for acute anemia one month ago, EGD and colonoscopy were done which were completely normal. She had no signs of bleeding at the time either, GI recommended capsule endoscopy if recurrence of acute drop in hemoglobin. She was also seen by hematology due to teardrop cells and small amount of schistocytes on her blood smear and hypoproliferation on retic count, and outpatient bone marrow biopsy was planned. On follow-up it was decided to defer the biopsy as labs had remained stable and her anemia appeared close to baseline. Pancytopenia today is new and may be secondary to acute covid diagnosis as well as her myfortic.  I do not think her symptoms are secondary to an acute GI bleed but are slightly below baseline due to her acute covid illness. She is also on myfortic which can lead to bone marrow suppression.   - transfuse 1U now  - discussed  with nephrology, will hold myfortic for now  - discussed with hematology, pancytopenia thought to be most likely related to acute illness. They would like to monitor for now and consider BM biopsy if no improvement.  - capsule endoscopy if no improvement or sign of GI bleeding occurs  - hold eliquis for now, SCDs ordered  - smear review ordered  - repeat retic count  CKD Stage V Hypocalcemia 2/2 Hyperparathyroidism 2/2 Bone Mineral Dz History of hypocalcemia with recent admission requiring IV Ca gluconate. She has received one dose today. Corrected calcium is 6.4. She was supposed to follow-up with [facility] on 2/22.   - consult to nephrology as she will be unlikely to see them outpatient with covid. They will likely see her tomorrow.  - increase oscal 500-200mg  to qid  - cont. renvela - check phos - cont. Prograf, hold myfortic - holding prednisone, tx covid  w/decadron  Covid-19 Diagnosed 2/13 per patient. She does have increased cough and increased supplemental O2 requirement. Baseline is around 2-3 L, she is currently on 5L.   - supplemental O2 prn  - check inflammatory labs  - start decadron - hold remdesivir with CKD V - flutter valve/IS - albuterol prn  Chronic Combined Systolic and Diastolic Heart Failure  Patient states she feels near baseline with her fluid status. She does have crackles, +1 pitting edema up to the abdomen and vascular congestion on cxr which appears chronic. BNP is also 2900, which is near prior readings. Home meds include torsemide 80 mg qd, toprol 25 mg qd. They are unable to weigh her in the ER but it does appear she was 260lbs during last admission in Jan but was discharged at 235lbs last October. Over the last year she has had significant deterioration leading to difficulty with ambulation, and I anticipate some of this weight gain is unfortunately due to immobility.   - lasix 80 mg IV once - consider additional evening dose if no increased Uop.  With acute covid would like to keep her dry.  - cont. metop - monitor renal function closely  - strict I/O - daily weights, will need to try and establish EDW.   Hypertension Home medications include hydralazine-isosorbide 37.5-20 tid, amio 200 mg qd, toprol 25 mg qd. Blood pressure currently 716 systolic.   - cont. Amio, toprol - hold isosorbide-hydral for now  Depression Previously weaning celexa due to CKD Stage V.   - decrease to 10 mg for two weeks then stop   Diet: renal  VTE: SCDs IVF: none Code: full   Dispo: Admit patient to Observation with expected length of stay less than 2 midnights.  SignedMarty Heck, DO 09/04/2020, 1:05 PM  Pager: [phone]

## 2020-08-22 NOTE — ED Triage Notes (Addendum)
Pt arrived via EMS for cc of low Hbg. EMS placed patient in room and left before giving report to RN.   Pt reports she tested positive for COVID on Sunday.   Pt reports weakness, shortness of breath, and fatigue. On 2L [place] at baseline.

## 2020-08-22 NOTE — ED Provider Notes (Signed)
[facility] EMERGENCY DEPARTMENT Provider Note   CSN: [id number] Arrival date & time: 08/15/2020  0617     History Chief Complaint  Patient presents with  . Abnormal Lab    [name] is a 55 y.o. female.  Patient with past medical history of anemia of chronic disease, CKD Stage IV s/p renal transplant in 2011, secondary hyperparathyroidism, chronic biventricular heart failure, atrial fibrillation on anticoagulation, hypertension and antithrombin III deficiency -- presents to the ED for evaluation of low hemoglobin (6.1) and hypocalcemia (5.3).  Patient states that she had routine labs drawn yesterday.  She is currently at short-term rehab.  She states that she was woke up this morning, in her typical state of health, stating that she needed to go to the hospital because of these values.  Patient is chronically on 2 L nasal cannula.  She states that she tested positive for Covid 2 days ago.  She has had one episode of diarrhea and one episode of vomiting since that time.  She denies significant cough, shortness of breath or trouble breathing.  No worse than baseline lower extremity swelling.  No fevers.  She states that she has felt generally weak since arriving to rehab.  Patient was admitted to internal medicine teaching service from 1/24-1/30/2022.  During that hospitalization she was transfused 2 units of packed red cells due to hemoglobin in the fives.  Colonoscopy and EGD negative.  She was also hypocalcemic and calcitriol increased to BID, oscal with D 500-200 increased to tid, renvela also started.         Past Medical History:  Diagnosis Date  . Acute heart failure ([place]) 06/2020  . Blood transfusion without reported diagnosis   . CHF (congestive heart failure) ([place])   . 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 GERD (gastroesophageal reflux disease)    medication induced  . Hypertension   .  Renal failure    hx of  . 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 Rectal bleeding   . Chronic anticoagulation   . Acute on chronic anemia 08/01/2020  . NSVT (nonsustained ventricular tachycardia) ([place])   . Chronic kidney disease (CKD), stage IV (severe) ([place]) 07/05/2020  . Acute on chronic heart failure ([place]) 07/04/2020  . Antithrombin III deficiency ([place]) 04/04/2020  . Elevated alkaline phosphatase level 04/04/2020  . DVT of right axillary vein, acute ([place]) 04/03/2020  . Iliopsoas bursitis of right hip 04/03/2020  . PAF (paroxysmal atrial fibrillation) ([place]) 03/22/2020  . Chronic kidney disease, stage 3b ([place]) 03/22/2020  . Anemia of chronic disease 03/20/2020  . Heart failure with reduced ejection fraction ([place]) 03/19/2020  . Multiple pulmonary nodules 11/14/2019  . Vitamin B 12 deficiency 10/11/2019  . Myalgia 10/11/2019  . Leg weakness, bilateral 08/19/2019  . GERD (gastroesophageal reflux disease) 01/27/2019  . Secondary hyperparathyroidism of renal origin ([place]) 06/11/2018  . Encounter for monitoring tacrolimus therapy 05/22/2018  . Hyperlipidemia 02/12/2017  . Preventative health care 08/12/2012  . Tobacco abuse 04/30/2012  . S/p cadaver renal transplant 11/19/2011  . Type II diabetes mellitus, well controlled ([place]) 11/04/2011  . Acute kidney  injury superimposed on chronic kidney disease ([place]) 11/03/2011  . HTN (hypertension) 11/03/2011    Past Surgical History:  Procedure Laterality Date  . [place]  . CHOLECYSTECTOMY  2008  . COLONOSCOPY  10 years ago   Dr.Hung="normal"  . COLONOSCOPY WITH PROPOFOL N/A 08/05/2020   Procedure: COLONOSCOPY WITH PROPOFOL;  Surgeon: Carol Ada, MD;  Location: [facility];  Service: Endoscopy;  Laterality: N/A;  . DG AV DIALYSIS  SHUNT ACCESS EXIST*L* OR    . ESOPHAGOGASTRODUODENOSCOPY (EGD) WITH PROPOFOL N/A 08/05/2020   Procedure: ESOPHAGOGASTRODUODENOSCOPY (EGD) WITH PROPOFOL;  Surgeon: Carol Ada, MD;  Location: [facility];  Service: Endoscopy;  Laterality: N/A;  . KIDNEY TRANSPLANT  2011  . UMBILICAL HERNIA REPAIR  2009     OB History   No obstetric history on file.     Family History  Problem Relation Age of Onset  . Hypertension Mother   . Diabetes Mother   . Atrial fibrillation Father   . Hypertension Father   . Arthritis Father   . Heart failure Sister   . Hypertension Sister        all 4 sisters  . Hypertension Brother        both brothers  . Diabetes Brother   . Colon cancer Neg Hx   . Stomach cancer Neg Hx     Social History   Tobacco Use  . Smoking status: Former Smoker    Packs/day: 0.50    Types: Cigarettes  . Smokeless tobacco: Never Used  . Tobacco comment: 10 some days   Vaping Use  . Vaping Use: Never used  Substance Use Topics  . Alcohol use: Never    Alcohol/week: 0.0 standard drinks  . Drug use: Never    Home Medications Prior to Admission medications   Medication Sig Start Date End Date Taking? Authorizing Provider  acetaminophen (TYLENOL) 325 MG tablet Take 2 tablets (650 mg total) by mouth every 6 (six) hours as needed for mild pain (or Fever >/= 101). 03/24/20   Marianna Payment, MD  albuterol (PROVENTIL) (2.5 MG/3ML) 0.083% nebulizer solution Take 3 mLs (2.5 mg total) by nebulization every 6 (six) hours as needed for wheezing or shortness of breath. 03/24/20   Marianna Payment, MD  amiodarone (PACERONE) 200 MG tablet Take 1 tablet (200 mg total) by mouth daily. 07/21/20 08/20/20  Rehman, Areeg N, DO  apixaban (ELIQUIS) 5 MG TABS tablet Take 1 tablet (5 mg total) by mouth 2 (two) times daily. 04/26/20 10/01/20  Angelica Pou, MD  atorvastatin (LIPITOR) 40 MG tablet Take  1 tablet (40 mg total) by mouth daily. Patient taking differently: Take 40 mg by mouth at bedtime. 03/25/20   Marianna Payment, MD  calcitRIOL (ROCALTROL) 0.5 MCG capsule Take 1 capsule (0.5 mcg total) by mouth 2 (two) times daily. 08/06/20   [name], [name], DO  calcium-vitamin D (OSCAL WITH D) 500-200 MG-UNIT tablet Take 1 tablet by mouth 3 (three) times daily. 08/06/20   [name], [name], DO  Darbepoetin Alfa (ARANESP) 60 MCG/0.3ML SOSY injection Inject 0.3 mLs (60 mcg total) into the skin every Saturday at 6 PM. 07/22/20   Rehman, Areeg N, DO  DULoxetine (CYMBALTA) 20 MG capsule Take one 20 mg tablet for one week, then take 1/2 tablet for two weeks, then stop medication 08/06/20   [name], [name], DO  ferrous sulfate 325 (65 FE) MG tablet Take 1 tablet (325 mg total) by mouth 2 (two) times daily with a  meal. 03/24/20   Marianna Payment, MD  guaiFENesin-dextromethorphan (ROBITUSSIN DM) 100-10 MG/5ML syrup Take 5 mLs by mouth every 4 (four) hours as needed for cough. 03/24/20   Marianna Payment, MD  insulin aspart (NOVOLOG) 100 UNIT/ML injection Inject 0-20 Units into the skin 3 (three) times daily with meals. 03/24/20   Marianna Payment, MD  isosorbide-hydrALAZINE (BIDIL) 20-37.5 MG tablet Take 1.5 tablets by mouth 3 (three) times daily. 08/06/20   [name], [name], DO  loperamide (IMODIUM) 2 MG capsule Take 2 capsules (4 mg total) by mouth daily as needed for diarrhea or loose stools. 07/20/20   Rehman, Areeg N, DO  loratadine (CLARITIN) 10 MG tablet Take 1 tablet (10 mg total) by mouth daily. Patient taking differently: Take 10 mg by mouth daily as needed for allergies. 03/25/20   Marianna Payment, MD  metoprolol succinate (TOPROL-XL) 25 MG 24 hr tablet Take 1 tablet (25 mg total) by mouth daily. 03/25/20   Marianna Payment, MD  mycophenolate (MYFORTIC) 180 MG EC tablet TAKE 3 TABLETS BY MOUTH TWICE DAILY Patient taking differently: Take 540 mg by mouth 2 (two) times daily. 03/31/20   Maudie Mercury, MD  omeprazole  (PRILOSEC) 40 MG capsule Take 40 mg by mouth daily. 05/22/20   [provider]  predniSONE (DELTASONE) 5 MG tablet Take 1 tablet (5 mg total) by mouth daily with breakfast. 07/21/20   Rehman, Areeg N, DO  sevelamer carbonate (RENVELA) 800 MG tablet Take 1 tablet (800 mg total) by mouth 3 (three) times daily with meals. 08/06/20   [name], [name], DO  tacrolimus (PROGRAF) 5 MG capsule Take 1 capsule (5 mg total) by mouth 2 (two) times daily. 07/20/20   Rehman, Areeg N, DO  torsemide (DEMADEX) 20 MG tablet Take 4 tablets (80 mg total) by mouth daily. 07/21/20   Rehman, Areeg N, DO  triamcinolone ointment (KENALOG) 0.1 % Apply 1 application topically 2 (two) times daily as needed (rash). 02/24/20   [provider]  vitamin B-12 (CYANOCOBALAMIN) 1000 MCG tablet Take 1 tablet (1,000 mcg total) by mouth daily. 10/11/19   Ladona Horns, MD    Allergies    Ardyth Harps Antonieta Pert dextran], Cefazolin, Ancef [cefazolin sodium], Lisinopril, and Other  Review of Systems   Review of Systems  Constitutional: Positive for fatigue. Negative for fever.  HENT: Negative for rhinorrhea and sore throat.   Eyes: Negative for redness.  Respiratory: Negative for cough and shortness of breath.   Cardiovascular: Negative for chest pain.  Gastrointestinal: Positive for diarrhea, nausea and vomiting. Negative for abdominal pain.  Genitourinary: Negative for dysuria, frequency, hematuria and urgency.  Musculoskeletal: Negative for myalgias.  Skin: Negative for rash.  Neurological: Positive for weakness. Negative for headaches.    Physical Exam Updated Vital Signs BP (!) 89/60 (BP Location: Left Arm)   Pulse 60   Temp 98.3 F (36.8 C) (Oral)   Resp (!) 21   LMP 11/05/2012   SpO2 100%   Physical Exam Vitals and nursing note reviewed.  Constitutional:      General: She is not in acute distress.    Appearance: She is well-developed.  HENT:     Head: Normocephalic and atraumatic.     Right Ear: External ear  normal.     Left Ear: External ear normal.     Nose: Nose normal.  Eyes:     Conjunctiva/sclera: Conjunctivae normal.  Cardiovascular:     Rate and Rhythm: Normal rate and regular rhythm.     Heart sounds:  No murmur heard.   Pulmonary:     Effort: No respiratory distress.     Breath sounds: No wheezing, rhonchi or rales.  Abdominal:     Palpations: Abdomen is soft.     Tenderness: There is no abdominal tenderness. There is no guarding or rebound.  Musculoskeletal:     Cervical back: Normal range of motion and neck supple.     Right lower leg: Edema present.     Left lower leg: Edema present.     Comments: 1+ pitting edema symmetric bilateral.   Skin:    General: Skin is warm and dry.     Findings: No rash.  Neurological:     General: No focal deficit present.     Mental Status: She is alert. Mental status is at baseline.     Motor: No weakness.  Psychiatric:        Mood and Affect: Mood normal.     ED Results / Procedures / Treatments   Labs (all labs ordered are listed, but only abnormal results are displayed) Labs Reviewed  CBC WITH DIFFERENTIAL/PLATELET - Abnormal; Notable for the following components:      Result Value   WBC 2.3 (*)    RBC 2.03 (*)    Hemoglobin 6.1 (*)    HCT 19.4 (*)    RDW 17.2 (*)    Platelets 81 (*)    Lymphs Abs 0.2 (*)    All other components within normal limits  COMPREHENSIVE METABOLIC PANEL - Abnormal; Notable for the following components:   CO2 21 (*)    Glucose, Bld 109 (*)    BUN 86 (*)    Creatinine, Ser 3.98 (*)    Calcium 5.7 (*)    Total Protein 5.4 (*)    Albumin 3.1 (*)    Alkaline Phosphatase 468 (*)    Total Bilirubin 0.2 (*)    GFR, Estimated 13 (*)    All other components within normal limits  BRAIN NATRIURETIC PEPTIDE - Abnormal; Notable for the following components:   B Natriuretic Peptide 2,903.7 (*)    All other components within normal limits  RESP PANEL BY RT-PCR (FLU A&B, COVID) ARPGX2  URINALYSIS,  ROUTINE W REFLEX MICROSCOPIC  TYPE AND SCREEN  PREPARE RBC (CROSSMATCH)    EKG None  Radiology DG Chest Port 1 View  Result Date: 08/08/2020 CLINICAL DATA:  Cough. EXAM: PORTABLE CHEST 1 VIEW COMPARISON:  07/08/2020. FINDINGS: Interim removal of left IJ line. Cardiomegaly again noted. Persistent dense bibasilar atelectasis scratched it persistent bibasilar atelectasis/infiltrates. Bilateral interstitial prominence again noted consistent interstitial edema and or pneumonitis. Slight improvement from prior exam. Small bilateral pleural effusions again noted. No pneumothorax. IMPRESSION: 1. Persistent bibasilar atelectasis/infiltrates and small bilateral pleural effusions. 2. Cardiomegaly again noted. Bilateral interstitial prominence again noted consistent with interstitial edema and or pneumonitis. Slight improvement from prior exam. 3. Interim removal of left IJ line.  No pneumothorax. Electronically Signed   By: Marcello Moores  Register   On: 08/14/2020 07:49    Procedures Procedures   Medications Ordered in ED Medications  calcium gluconate 1 g/ 50 mL sodium chloride IVPB (has no administration in time range)  0.9 %  sodium chloride infusion (10 mL/hr Intravenous New Bag/Given 08/13/2020 1100)    ED Course  I have reviewed the triage vital signs and the nursing notes.  Pertinent labs & imaging results that were available during my care of the patient were reviewed by me and considered in my medical decision making (  see chart for details).  Patient seen and examined.   Vital signs reviewed and are as follows: BP (!) 89/60 (BP Location: Left Arm)   Pulse 60   Temp 98.3 F (36.8 C) (Oral)   Resp (!) 21   LMP 11/05/2012   SpO2 100%   Patient here for possible decompensation of chronic anemia, hypercalcemia.  She has a positive Covid test but seems to be compensating very well from this.  She has not hypoxic.  Will obtain repeat labs and chest x-ray.  Patient in no distress.  9:14 AM Delay  in obtaining labs as patient is difficult stick -- now sent however.   10:09 AM Hgb 6.1, calcium 5.7 corrects to 6.4. Calcium gluconate ordered. Blood transfusion ordered.   10:56 AM Spoke with IMTS who will see.   CRITICAL CARE Performed by: Carlisle Cater PA-C Total critical care time: 35 minutes Critical care time was exclusive of separately billable procedures and treating other patients. Critical care was necessary to treat or prevent imminent or life-threatening deterioration. Critical care was time spent personally by me on the following activities: development of treatment plan with patient and/or surrogate as well as nursing, discussions with consultants, evaluation of patient's response to treatment, examination of patient, obtaining history from patient or surrogate, ordering and performing treatments and interventions, ordering and review of laboratory studies, ordering and review of radiographic studies, pulse oximetry and re-evaluation of patient's condition.    MDM Rules/Calculators/A&P                          Admit for above.   Final Clinical Impression(s) / ED Diagnoses Final diagnoses:  Symptomatic anemia  Hypocalcemia    Rx / DC Orders ED Discharge Orders    None       Carlisle Cater, PA-C 08/19/2020 [place], April, MD 08/25/20 [id number]

## 2020-08-22 NOTE — ED Notes (Signed)
Date and time results received: 08/29/2020 9:49 AM   Test: hemoglobin Critical Value: 6.1  Name of Provider Notified:Joshua [place], [place] Orders Received? Or Actions Taken?: Actions Taken: awaiting orders at this time.

## 2020-08-23 DIAGNOSIS — Z66 Do not resuscitate: Secondary | ICD-10-CM | POA: Diagnosis not present

## 2020-08-23 DIAGNOSIS — U071 COVID-19: Secondary | ICD-10-CM | POA: Diagnosis present

## 2020-08-23 DIAGNOSIS — J1282 Pneumonia due to coronavirus disease 2019: Secondary | ICD-10-CM | POA: Diagnosis present

## 2020-08-23 DIAGNOSIS — I5043 Acute on chronic combined systolic (congestive) and diastolic (congestive) heart failure: Secondary | ICD-10-CM | POA: Diagnosis not present

## 2020-08-23 DIAGNOSIS — Z940 Kidney transplant status: Secondary | ICD-10-CM | POA: Diagnosis not present

## 2020-08-23 DIAGNOSIS — I10 Essential (primary) hypertension: Secondary | ICD-10-CM

## 2020-08-23 DIAGNOSIS — J811 Chronic pulmonary edema: Secondary | ICD-10-CM | POA: Diagnosis not present

## 2020-08-23 DIAGNOSIS — J9690 Respiratory failure, unspecified, unspecified whether with hypoxia or hypercapnia: Secondary | ICD-10-CM | POA: Diagnosis not present

## 2020-08-23 DIAGNOSIS — I5020 Unspecified systolic (congestive) heart failure: Secondary | ICD-10-CM | POA: Diagnosis not present

## 2020-08-23 DIAGNOSIS — N184 Chronic kidney disease, stage 4 (severe): Secondary | ICD-10-CM | POA: Diagnosis not present

## 2020-08-23 DIAGNOSIS — D84821 Immunodeficiency due to drugs: Secondary | ICD-10-CM | POA: Diagnosis present

## 2020-08-23 DIAGNOSIS — R0603 Acute respiratory distress: Secondary | ICD-10-CM | POA: Diagnosis not present

## 2020-08-23 DIAGNOSIS — I480 Paroxysmal atrial fibrillation: Secondary | ICD-10-CM | POA: Diagnosis not present

## 2020-08-23 DIAGNOSIS — D649 Anemia, unspecified: Secondary | ICD-10-CM

## 2020-08-23 DIAGNOSIS — G931 Anoxic brain damage, not elsewhere classified: Secondary | ICD-10-CM | POA: Diagnosis not present

## 2020-08-23 DIAGNOSIS — R042 Hemoptysis: Secondary | ICD-10-CM | POA: Diagnosis present

## 2020-08-23 DIAGNOSIS — J9811 Atelectasis: Secondary | ICD-10-CM | POA: Diagnosis not present

## 2020-08-23 DIAGNOSIS — G9341 Metabolic encephalopathy: Secondary | ICD-10-CM | POA: Diagnosis not present

## 2020-08-23 DIAGNOSIS — K6389 Other specified diseases of intestine: Secondary | ICD-10-CM | POA: Diagnosis not present

## 2020-08-23 DIAGNOSIS — R918 Other nonspecific abnormal finding of lung field: Secondary | ICD-10-CM | POA: Diagnosis not present

## 2020-08-23 DIAGNOSIS — E874 Mixed disorder of acid-base balance: Secondary | ICD-10-CM | POA: Diagnosis present

## 2020-08-23 DIAGNOSIS — D61818 Other pancytopenia: Secondary | ICD-10-CM | POA: Diagnosis present

## 2020-08-23 DIAGNOSIS — Z4682 Encounter for fitting and adjustment of non-vascular catheter: Secondary | ICD-10-CM | POA: Diagnosis not present

## 2020-08-23 DIAGNOSIS — J1108 Influenza due to unidentified influenza virus with specified pneumonia: Secondary | ICD-10-CM | POA: Diagnosis not present

## 2020-08-23 DIAGNOSIS — R6521 Severe sepsis with septic shock: Secondary | ICD-10-CM | POA: Diagnosis not present

## 2020-08-23 DIAGNOSIS — D7589 Other specified diseases of blood and blood-forming organs: Secondary | ICD-10-CM | POA: Diagnosis not present

## 2020-08-23 DIAGNOSIS — A4159 Other Gram-negative sepsis: Secondary | ICD-10-CM | POA: Diagnosis not present

## 2020-08-23 DIAGNOSIS — J90 Pleural effusion, not elsewhere classified: Secondary | ICD-10-CM | POA: Diagnosis not present

## 2020-08-23 DIAGNOSIS — I132 Hypertensive heart and chronic kidney disease with heart failure and with stage 5 chronic kidney disease, or end stage renal disease: Secondary | ICD-10-CM | POA: Diagnosis present

## 2020-08-23 DIAGNOSIS — M7989 Other specified soft tissue disorders: Secondary | ICD-10-CM | POA: Diagnosis not present

## 2020-08-23 DIAGNOSIS — T8619 Other complication of kidney transplant: Secondary | ICD-10-CM | POA: Diagnosis present

## 2020-08-23 DIAGNOSIS — N186 End stage renal disease: Secondary | ICD-10-CM | POA: Diagnosis not present

## 2020-08-23 DIAGNOSIS — I5041 Acute combined systolic (congestive) and diastolic (congestive) heart failure: Secondary | ICD-10-CM

## 2020-08-23 DIAGNOSIS — N2581 Secondary hyperparathyroidism of renal origin: Secondary | ICD-10-CM | POA: Diagnosis present

## 2020-08-23 DIAGNOSIS — J9602 Acute respiratory failure with hypercapnia: Secondary | ICD-10-CM | POA: Diagnosis not present

## 2020-08-23 DIAGNOSIS — D6859 Other primary thrombophilia: Secondary | ICD-10-CM | POA: Diagnosis present

## 2020-08-23 DIAGNOSIS — A4189 Other specified sepsis: Secondary | ICD-10-CM | POA: Diagnosis not present

## 2020-08-23 DIAGNOSIS — Z452 Encounter for adjustment and management of vascular access device: Secondary | ICD-10-CM | POA: Diagnosis not present

## 2020-08-23 DIAGNOSIS — N179 Acute kidney failure, unspecified: Secondary | ICD-10-CM | POA: Diagnosis present

## 2020-08-23 DIAGNOSIS — J9601 Acute respiratory failure with hypoxia: Secondary | ICD-10-CM | POA: Diagnosis not present

## 2020-08-23 DIAGNOSIS — Y830 Surgical operation with transplant of whole organ as the cause of abnormal reaction of the patient, or of later complication, without mention of misadventure at the time of the procedure: Secondary | ICD-10-CM | POA: Diagnosis present

## 2020-08-23 DIAGNOSIS — E876 Hypokalemia: Secondary | ICD-10-CM | POA: Diagnosis not present

## 2020-08-23 DIAGNOSIS — D6832 Hemorrhagic disorder due to extrinsic circulating anticoagulants: Secondary | ICD-10-CM | POA: Diagnosis present

## 2020-08-23 DIAGNOSIS — I313 Pericardial effusion (noninflammatory): Secondary | ICD-10-CM | POA: Diagnosis present

## 2020-08-23 DIAGNOSIS — I130 Hypertensive heart and chronic kidney disease with heart failure and stage 1 through stage 4 chronic kidney disease, or unspecified chronic kidney disease: Secondary | ICD-10-CM | POA: Diagnosis not present

## 2020-08-23 LAB — CBC WITH DIFFERENTIAL/PLATELET
Abs Immature Granulocytes: 0.04 10*3/uL (ref 0.00–0.07)
Basophils Absolute: 0 10*3/uL (ref 0.0–0.1)
Basophils Relative: 0 %
Eosinophils Absolute: 0 10*3/uL (ref 0.0–0.5)
Eosinophils Relative: 0 %
HCT: 23.7 % — ABNORMAL LOW (ref 36.0–46.0)
Hemoglobin: 7.8 g/dL — ABNORMAL LOW (ref 12.0–15.0)
Immature Granulocytes: 2 %
Lymphocytes Relative: 6 %
Lymphs Abs: 0.1 10*3/uL — ABNORMAL LOW (ref 0.7–4.0)
MCH: 30.5 pg (ref 26.0–34.0)
MCHC: 32.9 g/dL (ref 30.0–36.0)
MCV: 92.6 fL (ref 80.0–100.0)
Monocytes Absolute: 0.2 10*3/uL (ref 0.1–1.0)
Monocytes Relative: 6 %
Neutro Abs: 2 10*3/uL (ref 1.7–7.7)
Neutrophils Relative %: 86 %
Platelets: 89 10*3/uL — ABNORMAL LOW (ref 150–400)
RBC: 2.56 MIL/uL — ABNORMAL LOW (ref 3.87–5.11)
RDW: 16.8 % — ABNORMAL HIGH (ref 11.5–15.5)
WBC: 2.4 10*3/uL — ABNORMAL LOW (ref 4.0–10.5)
nRBC: 0 % (ref 0.0–0.2)

## 2020-08-23 LAB — TYPE AND SCREEN
ABO/RH(D): A POS
Antibody Screen: NEGATIVE
Unit division: 0

## 2020-08-23 LAB — COMPREHENSIVE METABOLIC PANEL
ALT: 13 U/L (ref 0–44)
AST: 15 U/L (ref 15–41)
Albumin: 3.1 g/dL — ABNORMAL LOW (ref 3.5–5.0)
Alkaline Phosphatase: 489 U/L — ABNORMAL HIGH (ref 38–126)
Anion gap: 15 (ref 5–15)
BUN: 84 mg/dL — ABNORMAL HIGH (ref 6–20)
CO2: 21 mmol/L — ABNORMAL LOW (ref 22–32)
Calcium: 5.8 mg/dL — CL (ref 8.9–10.3)
Chloride: 106 mmol/L (ref 98–111)
Creatinine, Ser: 3.94 mg/dL — ABNORMAL HIGH (ref 0.44–1.00)
GFR, Estimated: 13 mL/min — ABNORMAL LOW (ref >60)
Glucose, Bld: 162 mg/dL — ABNORMAL HIGH (ref 70–99)
Potassium: 3.9 mmol/L (ref 3.5–5.1)
Sodium: 142 mmol/L (ref 135–145)
Total Bilirubin: 0.5 mg/dL (ref 0.3–1.2)
Total Protein: 5.5 g/dL — ABNORMAL LOW (ref 6.5–8.1)

## 2020-08-23 LAB — BPAM RBC
Blood Product Expiration Date: 202203012359
ISSUE DATE / TIME: 202202151034
Unit Type and Rh: 6200

## 2020-08-23 LAB — C-REACTIVE PROTEIN: CRP: 1 mg/dL — ABNORMAL HIGH (ref <1.0)

## 2020-08-23 LAB — IRON AND TIBC
Iron: 61 ug/dL (ref 28–170)
Saturation Ratios: 24 % (ref 10.4–31.8)
TIBC: 259 ug/dL (ref 250–450)
UIBC: 198 ug/dL

## 2020-08-23 LAB — FERRITIN: Ferritin: 504 ng/mL — ABNORMAL HIGH (ref 11–307)

## 2020-08-23 LAB — PATHOLOGIST SMEAR REVIEW

## 2020-08-23 LAB — RETICULOCYTES
Immature Retic Fract: 10.1 % (ref 2.3–15.9)
RBC.: 2.55 MIL/uL — ABNORMAL LOW (ref 3.87–5.11)
Retic Count, Absolute: 30.3 10*3/uL (ref 19.0–186.0)
Retic Ct Pct: 1.2 % (ref 0.4–3.1)

## 2020-08-23 LAB — PHOSPHORUS: Phosphorus: 5.5 mg/dL — ABNORMAL HIGH (ref 2.5–4.6)

## 2020-08-23 LAB — D-DIMER, QUANTITATIVE: D-Dimer, Quant: 3.73 ug/mL-FEU — ABNORMAL HIGH (ref 0.00–0.50)

## 2020-08-23 LAB — MRSA PCR SCREENING: MRSA by PCR: NEGATIVE

## 2020-08-23 LAB — MAGNESIUM: Magnesium: 1.5 mg/dL — ABNORMAL LOW (ref 1.7–2.4)

## 2020-08-23 MED ORDER — SENNA 8.6 MG PO TABS: 1.0000 | ORAL_TABLET | Freq: Every day | ORAL | Status: DC | PRN

## 2020-08-23 MED ORDER — HYDROCORTISONE 1 % EX CREA
1.0000 "application " | TOPICAL_CREAM | Freq: Two times a day (BID) | CUTANEOUS | Status: DC | PRN
  Administered 2020-08-23 – 2020-08-26 (×3): 1 via TOPICAL
  Filled 2020-08-23: qty 28

## 2020-08-23 MED ORDER — POLYETHYLENE GLYCOL 3350 17 G PO PACK
17.0000 g | PACK | Freq: Every day | ORAL | Status: DC
  Administered 2020-08-23 – 2020-08-24 (×2): 17 g via ORAL
  Filled 2020-08-23 (×3): qty 1

## 2020-08-23 MED ORDER — CALCIUM GLUCONATE-NACL 2-0.675 GM/100ML-% IV SOLN
2.0000 g | Freq: Once | INTRAVENOUS | Status: AC
  Administered 2020-08-23: 2000 mg via INTRAVENOUS
  Filled 2020-08-23: qty 100

## 2020-08-23 MED ORDER — MAGNESIUM SULFATE 2 GM/50ML IV SOLN
2.0000 g | Freq: Once | INTRAVENOUS | Status: AC
  Administered 2020-08-23: 2 g via INTRAVENOUS
  Filled 2020-08-23: qty 50

## 2020-08-23 MED ORDER — TORSEMIDE 20 MG PO TABS
80.0000 mg | ORAL_TABLET | Freq: Two times a day (BID) | ORAL | Status: DC
  Administered 2020-08-23 – 2020-08-26 (×7): 80 mg via ORAL
  Filled 2020-08-23 (×7): qty 4

## 2020-08-23 NOTE — Consult Note (Signed)
[facility] CONSULT NOTE  Patient Care Team: [name], DO as PCP - General Dominic Pea, DO as Referring Physician (Internal Medicine)  CHIEF COMPLAINTS/PURPOSE OF CONSULTATION:  Anemia and thrombocytopenia.  ASSESSMENT & PLAN:  No problem-specific Assessment & Plan notes found for this encounter.  1. Anemia, normocytic normochromic Progressive for several months, now admitted with acute on chronic anemia. No evidence of hemolysis. My personal review of the smear showed scattered schistocytes, rare tear drop cells. No clear evidence of TTP or other microangiopathy I think this is acute on chronic anemia, since there is no clear evidence of blood loss, it is reasonable to proceed with inpatient BMB. No evidence of nutritional deficiency or monoclonal gammopathy Discussed with primary team  2. Thrombocytopenia, acute on chronic Likely from acute illness but since she also has unexplained drop in Hb, it is reasonable to proceed with inpatient BMB No concern for microangiopathy at this time, Will continue to monitor.  3. Leukopenia, likely again secondary to bone marrow suppression from acute illness since this was normal again a week ago. We will continue to monitor.   Thank you for the consult. We will monitor periodically.  PLAN Proceed with inpatient BMB, will have to be done through radiology Consider transfusion to maintain a Hb of 8 Consider platelet transfusion to maintain platelet count of 20 K   HISTORY OF PRESENTING ILLNESS:   [name] 55 y.o. female is here because of abnormal labs.  Patient tells me that she was a rehab facility and she had some labs which showed hemoglobin of 5.  She was transferred to the hospital because of severe anemia. She is completely asymptomatic She has baseline oxygen needs, but didn't feel any more SOB compared to baseline. No bleeding in her stool or black colored stool. No blood in her urine. She  hasn't noticed any headaches, falls or seizures. No change in bowel habits, having regular bowel movements.  Rest of the pertinent 10 point ROS reviewed and negative.  MEDICAL HISTORY:  Past Medical History:  Diagnosis Date  . Acute heart failure ([facility]) 06/2020  . Blood transfusion without reported diagnosis   . CHF (congestive heart failure) ([facility])   . 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 GERD (gastroesophageal reflux disease)    medication induced  . Hypertension   . Renal failure    hx of  . 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 [place]  . CHOLECYSTECTOMY  2008  . COLONOSCOPY  10 years ago   Dr.Hung="normal"  . COLONOSCOPY WITH PROPOFOL N/A 08/05/2020   Procedure: COLONOSCOPY WITH PROPOFOL;  Surgeon: Carol Ada, MD;  Location: [facility];  Service: Endoscopy;  Laterality: N/A;  . DG AV DIALYSIS  SHUNT ACCESS EXIST*L* OR    . ESOPHAGOGASTRODUODENOSCOPY (EGD) WITH PROPOFOL N/A 08/05/2020   Procedure: ESOPHAGOGASTRODUODENOSCOPY (EGD) WITH PROPOFOL;  Surgeon: Carol Ada, MD;  Location: [facility];  Service: Endoscopy;  Laterality: N/A;  . KIDNEY TRANSPLANT  2011  . UMBILICAL HERNIA REPAIR  2009    SOCIAL HISTORY: Social History   Socioeconomic History  . Marital  status: Single    Spouse name: Not on file  . Number of children: 2  . Years of education: 71  . Highest education level: Not on file  Occupational History    Employer: UNEMPLOYED  Tobacco Use  . Smoking status: Former Smoker    Packs/day: 0.50    Types: Cigarettes  . Smokeless tobacco: Never Used  . Tobacco comment: 10 some  days   Vaping Use  . Vaping Use: Never used  Substance and Sexual Activity  . Alcohol use: Never    Alcohol/week: 0.0 standard drinks  . Drug use: Never  . Sexual activity: Not Currently  Other Topics Concern  . 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 Difficulty of Paying Living Expenses: Somewhat hard  Food Insecurity: Food Insecurity Present  . Worried About Charity fundraiser in the Last Year: Sometimes true  . Ran Out of Food in the Last Year: Sometimes true  Transportation Needs: No Transportation Needs  . Lack of Transportation (Medical): No  . Lack of Transportation (Non-Medical): No  Physical Activity: Inactive  . Days of Exercise per Week: 0 days  . Minutes of Exercise per Session: 0 min  Stress: Not on file  Social Connections: Unknown  . Frequency of Communication with Friends and Family: More than three times a week  . Frequency of Social Gatherings with Friends and Family: More than three times a week  . Attends Religious Services: Not on file  . Active Member of Clubs or Organizations: Not on file  . Attends Archivist Meetings: Not on file  . Marital Status: Not on file  Intimate Partner Violence: Not on file    FAMILY HISTORY: Family History  Problem Relation Age of Onset  . Hypertension Mother   . Diabetes Mother   . Atrial fibrillation Father   . Hypertension Father   . Arthritis Father   . Heart failure Sister   . Hypertension Sister        all 4 sisters  . Hypertension Brother        both brothers  . Diabetes Brother   . Colon cancer Neg Hx    . Stomach cancer Neg Hx     ALLERGIES:  is allergic to infed [iron dextran], cefazolin, ancef [cefazolin sodium], lisinopril, and other.  MEDICATIONS:  Current Facility-Administered Medications  Medication Dose Route Frequency Provider Last Rate Last Admin  . albuterol (VENTOLIN HFA) 108 (90 Base) MCG/ACT inhaler 2 puff  2 puff Inhalation Q6H [name], [name], DO   2 puff at 08/23/20 1253  . amiodarone (PACERONE) tablet 200 mg  200 mg Oral Daily [name], [name], DO   200 mg at 08/23/20 0955  . atorvastatin (LIPITOR) tablet 40 mg  40 mg Oral Daily [name], [name], DO   40 mg at 08/23/20 0954  . calcitRIOL (ROCALTROL) capsule 0.5 mcg  0.5 mcg Oral BID [name], [name], DO   0.5 mcg at 08/23/20 0953  . calcium-vitamin D (OSCAL WITH D) 500-200 MG-UNIT per tablet  1 tablet  1 tablet Oral TID [name], [name], DO   1 tablet at 08/23/20 1701  . [START ON 08/26/2020] Darbepoetin Alfa (ARANESP) injection 60 mcg  60 mcg Subcutaneous Q Sat-1800 [name], [name], DO      . dexamethasone (DECADRON) tablet 6 mg  6 mg Oral Q24H [name], [name], DO   6 mg at 08/23/20 1253  . guaiFENesin-dextromethorphan (ROBITUSSIN DM) 100-10 MG/5ML syrup 10 mL  10 mL Oral Q4H PRN [name], [name], DO   10 mL at 08/20/2020 1954  . hydrocortisone cream 1 % 1 application  1 application Topical BID PRN Iona Beard, MD   1 application at 77/41/28 1700  . loratadine (CLARITIN) tablet 10 mg  10 mg Oral Daily PRN [name], [name], DO      . metoprolol succinate (TOPROL-XL) 24 hr tablet 25 mg  25 mg Oral Daily [name], [name], DO   25 mg at 08/23/20 0955  . pantoprazole (PROTONIX) EC tablet 40 mg  40 mg Oral Daily [name], [name], DO   40 mg at 08/23/20 0954  . polyethylene glycol (MIRALAX / GLYCOLAX) packet 17 g  17 g Oral Daily Iona Beard, MD   17 g at 08/23/20 1701  . senna (SENOKOT) tablet 8.6 mg  1 tablet Oral Daily PRN Iona Beard, MD      . sevelamer carbonate (RENVELA) tablet 800 mg  800 mg Oral TID WC  [name], [name], DO   800 mg at 08/23/20 1701  . tacrolimus (PROGRAF) capsule 5 mg  5 mg Oral BID [name], [name], DO   5 mg at 08/23/20 0954  . torsemide (DEMADEX) tablet 80 mg  80 mg Oral BID Edrick Oh, MD   80 mg at 08/23/20 1701  . vitamin B-12 (CYANOCOBALAMIN) tablet 1,000 mcg  1,000 mcg Oral Daily [name], [name], DO   1,000 mcg at 08/23/20 0953  . witch hazel-glycerin (TUCKS) pad   Topical PRN [name], [name], DO         PHYSICAL EXAMINATION:  Vitals:   08/23/20 0500 08/23/20 1504  BP: (!) 187/87 (!) 170/96  Pulse: 60 72  Resp: 18 19  Temp: 97.8 F (36.6 C) 98 F (36.7 C)  SpO2: 100% 100%   Filed Weights   08/23/20 0557  Weight: 264 lb 1.8 oz (119.8 kg)    GENERAL:alert, no distress and comfortable SKIN: skin color, texture, turgor are normal, no rashes or significant lesions OROPHARYNX: no exudate, no erythema and lips, buccal mucosa, and tongue normal  NECK: supple, thyroid normal size, non-tender, without nodularity LYMPH:  no palpable lymphadenopathy in the cervical, axillary  LUNGS: clear to auscultation in anterior lung fields. HEART: regular rate & rhythm and no murmurs and no lower extremity edema ABDOMEN:abdomen soft, non-tender and normal bowel sounds, no hepatosplenomegaly Musculoskeletal:no cyanosis of digits and no clubbing  PSYCH: alert & oriented x 3 with fluent speech NEURO: no focal motor/sensory deficits  LABORATORY DATA:  I have reviewed the data as listed Lab Results  Component Value Date   WBC 2.4 (L) 08/23/2020   HGB 7.8 (L) 08/23/2020   HCT 23.7 (L) 08/23/2020   MCV 92.6 08/23/2020   PLT 89 (L) 08/23/2020   RADIOGRAPHIC STUDIES: I have personally reviewed the radiological images as listed and agreed with the findings in the report. DG Chest Port 1 View  Result Date: 08/18/2020 CLINICAL DATA:  Cough. EXAM: PORTABLE CHEST 1 VIEW COMPARISON:  07/08/2020. FINDINGS: Interim removal of left  IJ line. Cardiomegaly again noted.  Persistent dense bibasilar atelectasis scratched it persistent bibasilar atelectasis/infiltrates. Bilateral interstitial prominence again noted consistent interstitial edema and or pneumonitis. Slight improvement from prior exam. Small bilateral pleural effusions again noted. No pneumothorax. IMPRESSION: 1. Persistent bibasilar atelectasis/infiltrates and small bilateral pleural effusions. 2. Cardiomegaly again noted. Bilateral interstitial prominence again noted consistent with interstitial edema and or pneumonitis. Slight improvement from prior exam. 3. Interim removal of left IJ line.  No pneumothorax. Electronically Signed   By: Marcello Moores  Register   On: 08/13/2020 07:49    All questions were answered. The patient knows to call the clinic with any problems, questions or concerns. I spent 50 minutes in the care of this patient including H and P, review of records, counseling and coordination of care.     Benay Pike, MD 08/23/2020 6:29 PM

## 2020-08-23 NOTE — Consult Note (Addendum)
Referring Provider:  [facility]         Primary Care Physician:  [name], DO Primary Gastroenterologist: Oretha Caprice, MD                 We were asked to see this patient for:    Anemia              ASSESSMENT / PLAN:    # 55 yo female with multiple medical problems readmitted with recurrent acute on chronic anemia on Eliquis and new thrombocytopenia. She gets Aranesp.  Oral iron on home med list but patient not sure if she takes it. Saw Hematology on 08/17/20. Anemia thought to be related to chronic disease and hgb felt to have been stable over the last two years so bone marrow biopsy was not scheduled. However, patient has received 8 units of blood since 12/31 and today got her 9th unit. No overt GI bleeding. Heme negative in January.  Negative EGD and colonoscopy in late January 2022 --We are happy to arrange for small bowel video capsule study as outpatient but ask that Hematology first reevaluate patient.     # Hypocalemia, replacement in progress  # [id number]. Not hypoxic. CXR >> chronic bilateral effusions. Productive cough --Primary team giving decardron  # Combined systolic and diastolic heart failure. Elevated BNP.   # Hx of adenomatous colon polyps in 2018, no on last colonoscopy Jan 2022  # Hx of renal transplant in 2011, on immunosuppressants  # Elevated Alk phos. She has renal osteodystrophy     Attending Physician Note   I agree with the Advanced Practitioner's note, impression and recommendations.  Acute on chronic anemia now with pancytopenia and FOBT negative stool. Colonoscopy and EGD were negative 3 weeks ago. Recommend Hematology evaluation, possible bone marrow biopsy. If Hematology evaluation is not diagnostic can return to Dr. Ardis Hughs as an outpatient, after her acute illness resolves, for consideration of small bowel capsule endoscopy. GI signing off.     Lucio Edward, MD [facility] [phone]    HPI:                                                                                                                              Chief Complaint: recurrent anemia on Eliquis  [name] is a 55 y.o. female with multiple medical problems. She has a  history of systolic heart failure, hypertension, atrial fibrillation , DM 2, tobacco abuse, extensive CAD, CKD status post renal transplant in 2011, cholecystectomy, DVT on chronic Eliquis  07/04/20 Patient was hospitalized late the end of December with acute on chronic heart failure and worsening anemia.  FOBT negative. During that prolonged admission she recieved 3 units of blood. Discharged on 07/20/20 with hgb of 7.7.    08/01/20 Readmitted with acute on chronic anemial.  ast month at which time we saw her for evaluation of acute on chronic anemia with decline in hgb, hgb  5.3.  She got 4 units of blood that admissions.  We saw her in consultation, she got inpatient EGD and colonoscopy on 08/05/2020, see results below.  Essentially the exams were normal. Hematology consulted. Plan for outpatient bone marrow biopsy. Discharged on 08/06/20 with hgb of 7.8. On 08/14/20 her hgb was 8.4.   08/23/20 Brought back to ED yesterday after routine labs showed recurrent drop in hgb. In ED yesterday her hgb was 6.1, it has improved to 7.8 after a unit of blood. No overt GI bleeding. No epistaxis, no hematuria or vaginal bleeding. No abdominal pain, nausea nor vomiting. Patient cannot confirm if she is taking oral iron at home but denies black stools.    PREVIOUS ENDOSCOPIC EVALUATIONS / PERTINENT STUDIES   08/05/20 colonoscopy for IDA - Dr. Benson Norway -complete exam, good prep -The entire examined colon is normal. - No specimens collected  08/05/20 EGD for IDA - Dr. Benson Norway -Normal esophagus. - Normal stomach. - Normal examined duodenum. - No specimens collected  Oct 2018 Screening colonoscopy  --One 13 mm polyp in the cecum, removed with a hot snare. Resected and retrieved. - Three 2 to 4 mm polyps in the  sigmoid colon, in the descending colon and in the transverse colon, removed with a cold snare. Resected and retrieved. - One 15 mm polyp in the sigmoid colon, removed with a hot snare. Resected and retrieved. - Diverticulosis in the left colon. - The examination was otherwise normal on direct and retroflexion views.  Surgical [P], cecum, polyp - MULTIPLE FRAGMENTS OF TUBULAR ADENOMA. - NO HIGH GRADE DYSPLASIA OR MALIGNANCY. 2. Surgical [P], transverse and descending and sigmoid, polyp (3) - TUBULAR ADENOMA (X3 FRAGMENTS). - NO HIGH GRADE DYSPLASIA OR MALIGNANCY. 3. Surgical [P], sigmoid, polyp - INFLAMED PROLAPSE TYPE POLYP. - NO DYSPLASIA OR MALIGNANCY   Past Medical History:  Diagnosis Date  . Acute heart failure ([place]) 06/2020  . Blood transfusion without reported diagnosis   . CHF (congestive heart failure) ([place])   . 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 GERD (gastroesophageal reflux disease)    medication induced  . Hypertension   . Renal failure    hx of  . 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 [place]  . CHOLECYSTECTOMY  2008  . COLONOSCOPY  10 years ago   Dr.Hung="normal"  . COLONOSCOPY WITH PROPOFOL N/A 08/05/2020   Procedure: COLONOSCOPY WITH PROPOFOL;  Surgeon: Carol Ada, MD;  Location: [facility];  Service: Endoscopy;  Laterality: N/A;  . DG AV DIALYSIS  SHUNT ACCESS EXIST*L* OR    . ESOPHAGOGASTRODUODENOSCOPY (EGD) WITH PROPOFOL N/A 08/05/2020   Procedure: ESOPHAGOGASTRODUODENOSCOPY (EGD) WITH PROPOFOL;  Surgeon: Carol Ada, MD;  Location: [facility];  Service: Endoscopy;  Laterality: N/A;  .  KIDNEY TRANSPLANT  2011  . UMBILICAL HERNIA REPAIR  2009    Prior to Admission medications   Medication Sig Start Date End Date Taking? Authorizing Provider  acetaminophen (TYLENOL) 325 MG tablet Take 2 tablets (650 mg total) by mouth every 6 (six) hours as needed for mild pain (or Fever >/= 101). 03/24/20  Yes Marianna Payment, MD  albuterol (PROVENTIL) (2.5 MG/3ML) 0.083% nebulizer solution Take 3 mLs (2.5 mg total) by nebulization every 6 (six) hours as needed for wheezing or shortness of breath. Patient taking differently: Take 2.5 mg by nebulization every 6 (six) hours as needed for shortness of breath. 03/24/20  Yes Marianna Payment, MD  amiodarone (PACERONE) 200 MG tablet Take 1 tablet (200 mg total) by mouth daily. 07/21/20 08/20/20 Yes Rehman, Areeg N, DO  apixaban (ELIQUIS) 5 MG TABS tablet Take 1 tablet (5 mg total) by mouth 2 (two) times daily. 04/26/20 10/01/20 Yes Angelica Pou, MD  atorvastatin (LIPITOR) 40 MG tablet Take 1 tablet (40 mg total) by mouth daily. Patient taking differently: Take 40 mg by mouth at bedtime. 03/25/20  Yes Marianna Payment, MD  calcitRIOL (ROCALTROL) 0.5 MCG capsule Take 1 capsule (0.5 mcg total) by mouth 2 (two) times daily. 08/06/20  Yes [name], [name], DO  calcium-vitamin D (OSCAL WITH D) 500-200 MG-UNIT tablet Take 1 tablet by mouth 3 (three) times daily. 08/06/20  Yes [name], [name], DO  DULoxetine (CYMBALTA) 20 MG capsule Take one 20 mg tablet for one week, then take 1/2 tablet for two weeks, then stop medication Patient taking differently: Take 10 mg by mouth daily. 08/06/20  Yes [name], [name], DO  ferrous sulfate 325 (65 FE) MG tablet Take 1 tablet (325 mg total) by mouth 2 (two) times daily with a meal. 03/24/20  Yes Marianna Payment, MD  guaiFENesin-dextromethorphan (ROBITUSSIN DM) 100-10 MG/5ML syrup Take 5 mLs by mouth every 4 (four) hours as needed for cough. 03/24/20  Yes Marianna Payment, MD  insulin aspart (NOVOLOG) 100 UNIT/ML injection Inject 0-20  Units into the skin 3 (three) times daily with meals. 03/24/20  Yes Marianna Payment, MD  isosorbide-hydrALAZINE (BIDIL) 20-37.5 MG tablet Take 1.5 tablets by mouth 3 (three) times daily. Patient taking differently: Take 1 tablet by mouth 3 (three) times daily. 08/06/20  Yes [name], [name], DO  loperamide (IMODIUM) 2 MG capsule Take 2 capsules (4 mg total) by mouth daily as needed for diarrhea or loose stools. 07/20/20  Yes Rehman, Areeg N, DO  loratadine (CLARITIN) 10 MG tablet Take 1 tablet (10 mg total) by mouth daily. Patient taking differently: Take 10 mg by mouth daily as needed for allergies. 03/25/20  Yes Marianna Payment, MD  metoprolol succinate (TOPROL-XL) 25 MG 24 hr tablet Take 1 tablet (25 mg total) by mouth daily. 03/25/20  Yes Marianna Payment, MD  mycophenolate (MYFORTIC) 180 MG EC tablet TAKE 3 TABLETS BY MOUTH TWICE DAILY Patient taking differently: Take 540 mg by mouth 2 (two) times daily. 03/31/20  Yes Maudie Mercury, MD  omeprazole (PRILOSEC) 40 MG capsule Take 40 mg by mouth daily. 05/22/20  Yes [provider]  predniSONE (DELTASONE) 5 MG tablet Take 1 tablet (5 mg total) by mouth daily with breakfast. 07/21/20  Yes Rehman, Areeg N, DO  sevelamer carbonate (RENVELA) 800 MG tablet Take 1 tablet (800 mg total) by mouth 3 (three) times daily with meals. 08/06/20  Yes [name], [name], DO  tacrolimus (PROGRAF) 5 MG capsule Take 1 capsule (5 mg total) by mouth 2 (two) times daily. 07/20/20  Yes Rehman, Areeg N, DO  torsemide (DEMADEX) 20 MG tablet Take 4 tablets (80 mg total) by mouth daily. 07/21/20  Yes Rehman, Areeg N, DO  triamcinolone ointment (KENALOG) 0.1 % Apply 1 application topically 2 (two) times daily as needed (rash). 02/24/20  Yes [provider]  vitamin B-12 (CYANOCOBALAMIN) 1000 MCG  tablet Take 1 tablet (1,000 mcg total) by mouth daily. 10/11/19  Yes Ladona Horns, MD  Darbepoetin Alfa (ARANESP) 60 MCG/0.3ML SOSY injection Inject 0.3 mLs (60 mcg total) into the  skin every Saturday at 6 PM. 07/22/20   Rehman, Areeg N, DO    Current Facility-Administered Medications  Medication Dose Route Frequency Provider Last Rate Last Admin  . albuterol (VENTOLIN HFA) 108 (90 Base) MCG/ACT inhaler 2 puff  2 puff Inhalation Q6H [name], [name], DO   2 puff at 08/23/20 1253  . amiodarone (PACERONE) tablet 200 mg  200 mg Oral Daily [name], [name], DO   200 mg at 08/23/20 0955  . atorvastatin (LIPITOR) tablet 40 mg  40 mg Oral Daily [name], [name], DO   40 mg at 08/23/20 0954  . calcitRIOL (ROCALTROL) capsule 0.5 mcg  0.5 mcg Oral BID [name], [name], DO   0.5 mcg at 08/23/20 0953  . calcium-vitamin D (OSCAL WITH D) 500-200 MG-UNIT per tablet 1 tablet  1 tablet Oral TID [name], [name], DO   1 tablet at 08/23/20 0954  . [START ON 08/26/2020] Darbepoetin Alfa (ARANESP) injection 60 mcg  60 mcg Subcutaneous Q Sat-1800 [name], [name], DO      . dexamethasone (DECADRON) tablet 6 mg  6 mg Oral Q24H [name], [name], DO   6 mg at 08/23/20 1253  . guaiFENesin-dextromethorphan (ROBITUSSIN DM) 100-10 MG/5ML syrup 10 mL  10 mL Oral Q4H PRN [name], [name], DO   10 mL at 08/27/2020 1954  . loratadine (CLARITIN) tablet 10 mg  10 mg Oral Daily PRN [name], [name], DO      . metoprolol succinate (TOPROL-XL) 24 hr tablet 25 mg  25 mg Oral Daily [name], [name], DO   25 mg at 08/23/20 0955  . pantoprazole (PROTONIX) EC tablet 40 mg  40 mg Oral Daily [name], [name], DO   40 mg at 08/23/20 0954  . sevelamer carbonate (RENVELA) tablet 800 mg  800 mg Oral TID WC [name], [name], DO   800 mg at 08/23/20 1252  . tacrolimus (PROGRAF) capsule 5 mg  5 mg Oral BID [name], [name], DO   5 mg at 08/23/20 0954  . torsemide (DEMADEX) tablet 80 mg  80 mg Oral BID Edrick Oh, MD      . vitamin B-12 (CYANOCOBALAMIN) tablet 1,000 mcg  1,000 mcg Oral Daily [name], [name], DO   1,000 mcg at 08/23/20 0953  . witch hazel-glycerin (TUCKS) pad   Topical PRN [name], [name], DO         Allergies as of 08/28/2020 - Review Complete 09/04/2020  Allergen Reaction Noted  . Infed [iron dextran] Other (See Comments) 12/09/2011  . Cefazolin Itching and Nausea And Vomiting 11/30/2015  . Ancef [cefazolin sodium] Other (See Comments) 11/03/2011  . Lisinopril Swelling 11/30/2015  . Other  11/03/2011    Family History  Problem Relation Age of Onset  . Hypertension Mother   . Diabetes Mother   . Atrial fibrillation Father   . Hypertension Father   . Arthritis Father   . Heart failure Sister   . Hypertension Sister        all 4 sisters  . Hypertension Brother        both brothers  . Diabetes Brother   . Colon cancer Neg Hx   . Stomach cancer Neg Hx     Social History   Socioeconomic History  . Marital status: Single  Spouse name: Not on file  . Number of children: 2  . Years of education: 27  . Highest education level: Not on file  Occupational History    Employer: UNEMPLOYED  Tobacco Use  . Smoking status: Former Smoker    Packs/day: 0.50    Types: Cigarettes  . Smokeless tobacco: Never Used  . Tobacco comment: 10 some days   Vaping Use  . Vaping Use: Never used  Substance and Sexual Activity  . Alcohol use: Never    Alcohol/week: 0.0 standard drinks  . Drug use: Never  . Sexual activity: Not Currently  Other Topics Concern  . 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 Difficulty of Paying Living Expenses: Somewhat hard  Food Insecurity: Food Insecurity Present  . Worried About Charity fundraiser in the Last Year: Sometimes true  . Ran Out of Food in  the Last Year: Sometimes true  Transportation Needs: No Transportation Needs  . Lack of Transportation (Medical): No  . Lack of Transportation (Non-Medical): No  Physical Activity: Inactive  . Days of Exercise per Week: 0 days  . Minutes of Exercise per Session: 0 min  Stress: Not on file  Social Connections: Unknown  . Frequency of Communication with Friends and Family: More than three times a week  . Frequency of Social Gatherings with Friends and Family: More than three times a week  . Attends Religious Services: Not on file  . Active Member of Clubs or Organizations: Not on file  . Attends Archivist Meetings: Not on file  . Marital Status: Not on file  Intimate Partner Violence: Not on file    Review of Systems: All systems reviewed and negative except where noted in HPI.    OBJECTIVE:    Physical Exam: Vital signs in last 24 hours: Temp:  [97.8 F (36.6 C)-98.4 F (36.9 C)] 97.8 F (36.6 C) (02/16 0500) Pulse Rate:  [60-67] 60 (02/16 0500) Resp:  [18-20] 18 (02/16 0500) BP: (131-187)/(87-90) 187/87 (02/16 0500) SpO2:  [100 %] 100 % (02/16 0500) Weight:  [119.8 kg] 119.8 kg (02/16 0557) Last BM Date:  (pta) General:   Alert  female in NAD Psych:  Pleasant, cooperative. Normal mood and affect. Eyes:  Pupils equal, sclera clear, no icterus.   Conjunctiva pink. Ears:  Normal auditory acuity. Nose:  No deformity, discharge,  or lesions. Neck:  Supple; no masses Lungs:  Bilaterally there are decreased breath sounds, crackles and occasional wheezing.    Heart:  Regular rate, 1-2+ BLE edema. Abdomen:  Soft, non-distended, nontender, BS active, no palp mass   Rectal:  Deferred  Msk:  Symmetrical without gross deformities. . Neurologic:  Alert and  oriented x4;  grossly normal neurologically. Skin:  Intact without significant lesions or rashes.  Filed Weights   08/23/20 0557  Weight: 119.8 kg     Scheduled inpatient medications . albuterol  2 puff  Inhalation Q6H  . amiodarone  200 mg Oral Daily  . atorvastatin  40 mg Oral Daily  .  calcitRIOL  0.5 mcg Oral BID  . calcium-vitamin D  1 tablet Oral TID  . [START ON 08/26/2020] Darbepoetin Alfa  60 mcg Subcutaneous Q Sat-1800  . dexamethasone  6 mg Oral Q24H  . metoprolol succinate  25 mg Oral Daily  . pantoprazole  40 mg Oral Daily  . sevelamer carbonate  800 mg Oral TID WC  . tacrolimus  5 mg Oral BID  . torsemide  80 mg Oral BID  . vitamin B-12  1,000 mcg Oral Daily      Intake/Output from previous day: 02/15 0701 - 02/16 0700 In: 810 [Blood:660; IV Piggyback:150] Out: 1500 [Urine:1500] Intake/Output this shift: No intake/output data recorded.   Lab Results: Recent Labs    09/03/2020 0915 08/20/2020 2040 08/23/20 0250  WBC 2.3*  --  2.4*  HGB 6.1* 7.4* 7.8*  HCT 19.4* 23.7* 23.7*  PLT 81*  --  89*   BMET Recent Labs    08/24/2020 0915 08/23/20 0250  NA 142 142  K 3.9 3.9  CL 106 106  CO2 21* 21*  GLUCOSE 109* 162*  BUN 86* 84*  CREATININE 3.98* 3.94*  CALCIUM 5.7* 5.8*   LFT Recent Labs    08/23/20 0250  PROT 5.5*  ALBUMIN 3.1*  AST 15  ALT 13  ALKPHOS 489*  BILITOT 0.5   PT/INR No results for input(s): LABPROT, INR in the last 72 hours. Hepatitis Panel No results for input(s): HEPBSAG, HCVAB, HEPAIGM, HEPBIGM in the last 72 hours.   . CBC Latest Ref Rng & Units 08/23/2020 09/04/2020 08/27/2020  WBC 4.0 - 10.5 K/uL 2.4(L) - 2.3(L)  Hemoglobin 12.0 - 15.0 g/dL 7.8(L) 7.4(L) 6.1(LL)  Hematocrit 36.0 - 46.0 % 23.7(L) 23.7(L) 19.4(L)  Platelets 150 - 400 K/uL 89(L) - 81(L)    . CMP Latest Ref Rng & Units 08/23/2020 08/20/2020 08/14/2020  Glucose 70 - 99 mg/dL 162(H) 109(H) 125(H)  BUN 6 - 20 mg/dL 84(H) 86(H) 62(H)  Creatinine 0.44 - 1.00 mg/dL 3.94(H) 3.98(H) 3.15(H)  Sodium 135 - 145 mmol/L 142 142 143  Potassium 3.5 - 5.1 mmol/L 3.9 3.9 4.0  Chloride 98 - 111 mmol/L 106 106 102  CO2 22 - 32 mmol/L 21(L) 21(L) 19(L)  Calcium 8.9 - 10.3 mg/dL  5.8(LL) 5.7(LL) 6.8(LL)  Total Protein 6.5 - 8.1 g/dL 5.5(L) 5.4(L) 5.6(L)  Total Bilirubin 0.3 - 1.2 mg/dL 0.5 0.2(L) 0.5  Alkaline Phos 38 - 126 U/L 489(H) 468(H) 601(H)  AST 15 - 41 U/L '15 15 13  ' ALT 0 - 44 U/L '13 14 12   ' Studies/Results: DG Chest Port 1 View  Result Date: 08/15/2020 CLINICAL DATA:  Cough. EXAM: PORTABLE CHEST 1 VIEW COMPARISON:  07/08/2020. FINDINGS: Interim removal of left IJ line. Cardiomegaly again noted. Persistent dense bibasilar atelectasis scratched it persistent bibasilar atelectasis/infiltrates. Bilateral interstitial prominence again noted consistent interstitial edema and or pneumonitis. Slight improvement from prior exam. Small bilateral pleural effusions again noted. No pneumothorax. IMPRESSION: 1. Persistent bibasilar atelectasis/infiltrates and small bilateral pleural effusions. 2. Cardiomegaly again noted. Bilateral interstitial prominence again noted consistent with interstitial edema and or pneumonitis. Slight improvement from prior exam. 3. Interim removal of left IJ line.  No pneumothorax. Electronically Signed   By: Marcello Moores  Register   On: 08/28/2020 07:49    Active Problems:   Symptomatic anemia    Tye Savoy, NP-C @  08/23/2020, 1:51 PM

## 2020-08-23 NOTE — Progress Notes (Signed)
Notified Dr. Darnelle Bos that pt's BP is 187/87 HR 60. Pt is asymptomatic. MD stated she would look at pt's chart and see if they wanted to add anything. Will continue to monitor pt. Racheal Patches RN

## 2020-08-23 NOTE — Progress Notes (Signed)
CRITICAL VALUE ALERT  Critical Value: Calcium 5.8  Date & Time Notied:  08/23/20 0421  Provider Notified: Dr. Darnelle Bos  Orders Received/Actions taken: Dr. Darnelle Bos stated ok.

## 2020-08-23 NOTE — Progress Notes (Addendum)
HD#0 Subjective:  Overnight Events: None   Patient is seen at bedside. States that she feeling better. Breathing also improved. Denies melena recently.   Objective:  Vital signs in last 24 hours: Vitals:   09/04/2020 1450 08/17/2020 2100 08/23/20 0500 08/23/20 0557  BP: 131/90 (!) 184/90 (!) 187/87   Pulse: 67 64 60   Resp: 20 20 18    Temp: 98 F (36.7 C) 98.4 F (36.9 C) 97.8 F (36.6 C)   TempSrc: Oral Oral Oral   SpO2: 100% 100% 100%   Weight:    119.8 kg   Supplemental O2: Nasal Cannula SpO2: 100 % O2 Flow Rate (L/min): 4 L/min   Physical Exam:  Constitution:Well appearing, well nourished,  NAD, sitting up in bed  HENT: [place]/AT Eyes: no icterus or injection, mild conjunctival pallor Cardio: RRR, no m/r/g, +1 pitting edema up to thighs, chronic appearing Respiratory: On 4L [place] no respiratory distress, CTA Abdominal: normal BS, soft, NTTP, non-distended MSK: moving all extremities; RUE swollen, chronic  Neuro: alert & oriented, normal affect  Skin: dependent induration and edema   Filed Weights   08/23/20 0557  Weight: 119.8 kg     Intake/Output Summary (Last 24 hours) at 08/23/2020 0621 Last data filed at 08/23/2020 0542 Gross per 24 hour  Intake 810 ml  Output 1500 ml  Net -690 ml   Net IO Since Admission: -690 mL [08/23/20 0621]  Pertinent Labs: CBC Latest Ref Rng & Units 08/23/2020 08/29/2020 08/24/2020  WBC 4.0 - 10.5 K/uL 2.4(L) - 2.3(L)  Hemoglobin 12.0 - 15.0 g/dL 7.8(L) 7.4(L) 6.1(LL)  Hematocrit 36.0 - 46.0 % 23.7(L) 23.7(L) 19.4(L)  Platelets 150 - 400 K/uL 89(L) - 81(L)    CMP Latest Ref Rng & Units 08/23/2020 08/31/2020 08/14/2020  Glucose 70 - 99 mg/dL 162(H) 109(H) 125(H)  BUN 6 - 20 mg/dL 84(H) 86(H) 62(H)  Creatinine 0.44 - 1.00 mg/dL 3.94(H) 3.98(H) 3.15(H)  Sodium 135 - 145 mmol/L 142 142 143  Potassium 3.5 - 5.1 mmol/L 3.9 3.9 4.0  Chloride 98 - 111 mmol/L 106 106 102  CO2 22 - 32 mmol/L 21(L) 21(L) 19(L)  Calcium 8.9 - 10.3 mg/dL  5.8(LL) 5.7(LL) 6.8(LL)  Total Protein 6.5 - 8.1 g/dL 5.5(L) 5.4(L) 5.6(L)  Total Bilirubin 0.3 - 1.2 mg/dL 0.5 0.2(L) 0.5  Alkaline Phos 38 - 126 U/L 489(H) 468(H) 601(H)  AST 15 - 41 U/L 15 15 13   ALT 0 - 44 U/L 13 14 12    [facility] 269 Imaging: DG Chest Port 1 View  Result Date: 08/21/2020 CLINICAL DATA:  Cough. EXAM: PORTABLE CHEST 1 VIEW COMPARISON:  07/08/2020. FINDINGS: Interim removal of left IJ line. Cardiomegaly again noted. Persistent dense bibasilar atelectasis scratched it persistent bibasilar atelectasis/infiltrates. Bilateral interstitial prominence again noted consistent interstitial edema and or pneumonitis. Slight improvement from prior exam. Small bilateral pleural effusions again noted. No pneumothorax. IMPRESSION: 1. Persistent bibasilar atelectasis/infiltrates and small bilateral pleural effusions. 2. Cardiomegaly again noted. Bilateral interstitial prominence again noted consistent with interstitial edema and or pneumonitis. Slight improvement from prior exam. 3. Interim removal of left IJ line.  No pneumothorax. 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   Acute on Chronic Anemia Pancytopenia  Hgb on admission 6.1, baseline ~8. Improved to 7.8 after 1 unit pRBCs. Hgb now close to baseline. WBC and platelets low but stable from admission.Reticulocytes continue to hyperproliferation suggesting hyperproliferation. No signs of active bleeding.  Sme with leukopenia, normocytic anemia with tear cells,m schistocytes, and thrombocytopenia. Anemia may be due to recent COVID 19 infection vs bone  marrow suppression. - discussed with nephrology, will hold myfortic for now  - discussed with hematology, will discuss smear and recommendations for BM biopsy - discussed with GI, recommendations appreciated on possible capsule endoscopy - hold eliquis, SCDs ordered  - continue ESA - repeat retic count  Covid-19 Diagnosed 2/13 per patient. Continues to have a cough with small amount of sputum but otherwise felling much less short of breath. Baseline is around 2-3 L. Now at 4L. CRP 1, ddimer 3.3> 3.7, Ferritin 510>504. - supplemental O2 prn  - monitor inflammatory markers - Continue decadron 6 mg day 2/10 - flutter valve/IS - albuterol prn  Chronic Combined Systolic and Diastolic Heart Failure  No long tachyonic appears comfortable on 4L today. +1 pitting edema up to the abdomen and vascular congestion on cxr which appears chronic. she was 260lbs during last admission, weight is 264 lb now, 1.5 L ouput overnight after lasix. - Increase torsemide to 80 bid - cont. metoprolol - monitor renal function closely  - strict I/O - daily weights  CKD Stage V Hypocalcemia 2/2 Hyperparathyroidism 2/2 Bone Mineral Disease Corrected Ca 6.5 received IV caclum gluconate 2 g. Phos 5.5 - continue oscal 500-200mg  3 times daily - cont. renvela - cont. Prograf, hold myfortic - holding prednisone, tx covid w/decadron  Hypertension Home medications include hydralazine-isosorbide 37.5-20 tid, amio 200 mg qd, toprol 25 mg qd. Blood pressure currently 614 systolic.  - cont. Amio, toprol - Consider restarting isosorbide-hydral if she continues to be hypertensive  Depression Previously weaning celexa due to CKD Stage V.  - decrease to 10 mg for two weeks then stop   Diet: renal  VTE: SCDs IVF: none Code: full    Dispo: Anticipated discharge to Home in 3 days pending further workup.   Iona Beard, MD 08/23/2020, 6:21 AM Pager: [phone]  Please contact the on call pager after 5 pm  and on weekends at [phone].

## 2020-08-23 NOTE — Consult Note (Signed)
Referring Provider: No ref. provider found Primary Care Physician:  [name], DO Primary Nephrologist:  Dr. Marval Regal  Reason for Consultation: Medical management of end-stage renal disease status post transplant, maintenance of euvolemia, help with management of high risk medication.  HPI: This is a 55 year old lady with a history of renal transplant cadaveric 2011 appears like a baseline creatinine about 2.5 mg/dL based on notes from office.  However during her hospitalizations in January it looks like her creatinine has gradually been worsening.  On 08/06/2020 it did increase to 3.66 mg/dL she has a history of hypertension and congestive heart failure with a diminished ejection fraction of about 30%.  Atrial fibrillation and thrombophilia with an antithrombin III deficiency she presented 08/11/2020 with shortness of breath.  She was diagnosed with COVID-19 pneumonia 08/20/2020.  She has been admitted for Decadron and close monitoring.  Most recent hospitalizations #1 07/04/2020  -7/79/3903 complications of acute on chronic heart failure with a creatinine that had increased during hospitalization. Most recent hospitalizations #2   08/01/2020 -08/06/2020 with worsening anemia.  Blood pressure 187/87 pulse 60 temperature 97.8 O2 sats 100% 4 L nasal cannula  Sodium 142 potassium 3.9 chloride 106 CO2 21 BUN 84 creatinine 3.94 glucose 162 calcium 5.8 phosphorus 5.5 albumin 3.1 hemoglobin 7.8  Medications amiodarone 200 mg daily, Eliquis 5 mg daily Cymbalta 10 mg daily iron 325 mg twice daily insulin sliding scale, BiDil 20-37.5 3 times daily prednisone 5 mg daily, atorvastatin 40 mg daily Calcitrol 0.  5 mcg twice daily calcium 500 mg 3 times daily Claritin 10 mg daily as needed, metoprolol 25 mg daily Myfortic 540 mg twice daily omeprazole 40 mg daily Renvela 800 mg 3 times daily tacrolimus 5 mg twice daily torsemide 80 mg daily darbepoetin weekly  Past Medical History:  Diagnosis Date  . Acute  heart failure ([place]) 06/2020  . Blood transfusion without reported diagnosis   . CHF (congestive heart failure) ([place])   . 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 GERD (gastroesophageal reflux disease)    medication induced  . Hypertension   . Renal failure    hx of  . 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 [place]  . CHOLECYSTECTOMY  2008  . COLONOSCOPY  10 years ago   Dr.Hung="normal"  . COLONOSCOPY WITH PROPOFOL N/A 08/05/2020   Procedure: COLONOSCOPY WITH PROPOFOL;  Surgeon: Carol Ada, MD;  Location: [facility];  Service: Endoscopy;  Laterality: N/A;  . DG AV DIALYSIS  SHUNT ACCESS EXIST*L* OR    . ESOPHAGOGASTRODUODENOSCOPY (EGD) WITH PROPOFOL N/A 08/05/2020   Procedure: ESOPHAGOGASTRODUODENOSCOPY (EGD) WITH PROPOFOL;  Surgeon: Carol Ada, MD;  Location: [facility];  Service: Endoscopy;  Laterality: N/A;  . KIDNEY TRANSPLANT  2011  . UMBILICAL HERNIA REPAIR  2009    Prior to Admission medications   Medication Sig Start Date End Date Taking? Authorizing Provider  acetaminophen (TYLENOL) 325 MG tablet Take 2 tablets (650 mg total) by mouth every 6 (six) hours as needed for mild pain (or Fever >/= 101). 03/24/20  Yes Marianna Payment, MD  albuterol (PROVENTIL) (2.5 MG/3ML) 0.083% nebulizer solution Take  3 mLs (2.5 mg total) by nebulization every 6 (six) hours as needed for wheezing or shortness of breath. Patient taking differently: Take 2.5 mg by nebulization every 6 (six) hours as needed for shortness of breath. 03/24/20  Yes Marianna Payment, MD  amiodarone (PACERONE) 200 MG tablet Take 1 tablet (200 mg total) by mouth  daily. 07/21/20 08/20/20 Yes Rehman, Areeg N, DO  apixaban (ELIQUIS) 5 MG TABS tablet Take 1 tablet (5 mg total) by mouth 2 (two) times daily. 04/26/20 10/01/20 Yes Angelica Pou, MD  atorvastatin (LIPITOR) 40 MG tablet Take 1 tablet (40 mg total) by mouth daily. Patient taking differently: Take 40 mg by mouth at bedtime. 03/25/20  Yes Marianna Payment, MD  calcitRIOL (ROCALTROL) 0.5 MCG capsule Take 1 capsule (0.5 mcg total) by mouth 2 (two) times daily. 08/06/20  Yes [name], [name], DO  calcium-vitamin D (OSCAL WITH D) 500-200 MG-UNIT tablet Take 1 tablet by mouth 3 (three) times daily. 08/06/20  Yes [name], [name], DO  DULoxetine (CYMBALTA) 20 MG capsule Take one 20 mg tablet for one week, then take 1/2 tablet for two weeks, then stop medication Patient taking differently: Take 10 mg by mouth daily. 08/06/20  Yes [name], [name], DO  ferrous sulfate 325 (65 FE) MG tablet Take 1 tablet (325 mg total) by mouth 2 (two) times daily with a meal. 03/24/20  Yes Marianna Payment, MD  guaiFENesin-dextromethorphan (ROBITUSSIN DM) 100-10 MG/5ML syrup Take 5 mLs by mouth every 4 (four) hours as needed for cough. 03/24/20  Yes Marianna Payment, MD  insulin aspart (NOVOLOG) 100 UNIT/ML injection Inject 0-20 Units into the skin 3 (three) times daily with meals. 03/24/20  Yes Marianna Payment, MD  isosorbide-hydrALAZINE (BIDIL) 20-37.5 MG tablet Take 1.5 tablets by mouth 3 (three) times daily. Patient taking differently: Take 1 tablet by mouth 3 (three) times daily. 08/06/20  Yes [name], [name], DO  loperamide (IMODIUM) 2 MG capsule Take 2 capsules (4 mg total) by mouth daily as needed for diarrhea or loose stools. 07/20/20  Yes Rehman, Areeg N, DO  loratadine (CLARITIN) 10 MG tablet Take 1 tablet (10 mg total) by mouth daily. Patient taking differently: Take 10 mg by mouth daily as needed for allergies. 03/25/20  Yes Marianna Payment, MD  metoprolol succinate (TOPROL-XL) 25 MG 24 hr tablet Take 1 tablet (25 mg total) by  mouth daily. 03/25/20  Yes Marianna Payment, MD  mycophenolate (MYFORTIC) 180 MG EC tablet TAKE 3 TABLETS BY MOUTH TWICE DAILY Patient taking differently: Take 540 mg by mouth 2 (two) times daily. 03/31/20  Yes Maudie Mercury, MD  omeprazole (PRILOSEC) 40 MG capsule Take 40 mg by mouth daily. 05/22/20  Yes [provider]  predniSONE (DELTASONE) 5 MG tablet Take 1 tablet (5 mg total) by mouth daily with breakfast. 07/21/20  Yes Rehman, Areeg N, DO  sevelamer carbonate (RENVELA) 800 MG tablet Take 1 tablet (800 mg total) by mouth 3 (three) times daily with meals. 08/06/20  Yes [name], [name], DO  tacrolimus (PROGRAF) 5 MG capsule Take 1 capsule (5 mg total) by mouth 2 (two) times daily. 07/20/20  Yes Rehman, Areeg N, DO  torsemide (DEMADEX) 20 MG tablet Take 4 tablets (80 mg total) by mouth daily. 07/21/20  Yes Rehman, Areeg N, DO  triamcinolone ointment (KENALOG) 0.1 % Apply 1 application topically 2 (two) times daily as needed (rash). 02/24/20  Yes [provider]  vitamin B-12 (CYANOCOBALAMIN) 1000 MCG tablet Take 1 tablet (1,000 mcg total)  by mouth daily. 10/11/19  Yes Ladona Horns, MD  Darbepoetin Alfa (ARANESP) 60 MCG/0.3ML SOSY injection Inject 0.3 mLs (60 mcg total) into the skin every Saturday at 6 PM. 07/22/20   Rehman, Areeg N, DO    Current Facility-Administered Medications  Medication Dose Route Frequency Provider Last Rate Last Admin  . albuterol (VENTOLIN HFA) 108 (90 Base) MCG/ACT inhaler 2 puff  2 puff Inhalation Q6H [name], [name], DO   2 puff at 08/23/20 0607  . amiodarone (PACERONE) tablet 200 mg  200 mg Oral Daily [name], [name], DO      . atorvastatin (LIPITOR) tablet 40 mg  40 mg Oral Daily [name], [name], DO      . calcitRIOL (ROCALTROL) capsule 0.5 mcg  0.5 mcg Oral BID [name], [name], DO   0.5 mcg at 08/19/2020 2220  . calcium-vitamin D (OSCAL WITH D) 500-200 MG-UNIT per tablet 1 tablet  1 tablet Oral TID [name], [name], DO   1 tablet at 08/21/2020 2220   . [START ON 08/26/2020] Darbepoetin Alfa (ARANESP) injection 60 mcg  60 mcg Subcutaneous Q Sat-1800 [name], [name], DO      . dexamethasone (DECADRON) tablet 6 mg  6 mg Oral Q24H [name], [name], DO   6 mg at 08/24/2020 1550  . guaiFENesin-dextromethorphan (ROBITUSSIN DM) 100-10 MG/5ML syrup 10 mL  10 mL Oral Q4H PRN [name], [name], DO   10 mL at 08/14/2020 1954  . loratadine (CLARITIN) tablet 10 mg  10 mg Oral Daily PRN [name], [name], DO      . metoprolol succinate (TOPROL-XL) 24 hr tablet 25 mg  25 mg Oral Daily [name], [name], DO      . pantoprazole (PROTONIX) EC tablet 40 mg  40 mg Oral Daily [name], [name], DO      . sevelamer carbonate (RENVELA) tablet 800 mg  800 mg Oral TID WC [name], [name], DO      . tacrolimus (PROGRAF) capsule 5 mg  5 mg Oral BID [name], [name], DO   5 mg at 08/15/2020 1953  . vitamin B-12 (CYANOCOBALAMIN) tablet 1,000 mcg  1,000 mcg Oral Daily [name], [name], DO      . witch hazel-glycerin (TUCKS) pad   Topical PRN [name], [name], DO        Allergies as of 08/24/2020 - Review Complete 08/16/2020  Allergen Reaction Noted  . Infed [iron dextran] Other (See Comments) 12/09/2011  . Cefazolin Itching and Nausea And Vomiting 11/30/2015  . Ancef [cefazolin sodium] Other (See Comments) 11/03/2011  . Lisinopril Swelling 11/30/2015  . Other  11/03/2011    Family History  Problem Relation Age of Onset  . Hypertension Mother   . Diabetes Mother   . Atrial fibrillation Father   . Hypertension Father   . Arthritis Father   . Heart failure Sister   . Hypertension Sister        all 4 sisters  . Hypertension Brother        both brothers  . Diabetes Brother   . Colon cancer Neg Hx   . Stomach cancer Neg Hx     Social History   Socioeconomic History  . Marital status: Single    Spouse name: Not on file  . Number of children: 2  . Years of education: 32  . Highest education level: Not on file  Occupational History    Employer:  UNEMPLOYED  Tobacco Use  . Smoking status: Former Smoker  Packs/day: 0.50    Types: Cigarettes  . Smokeless tobacco: Never Used  . Tobacco comment: 10 some days   Vaping Use  . Vaping Use: Never used  Substance and Sexual Activity  . Alcohol use: Never    Alcohol/week: 0.0 standard drinks  . Drug use: Never  . Sexual activity: Not Currently  Other Topics Concern  . 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 Difficulty of Paying Living Expenses: Somewhat hard  Food Insecurity: Food Insecurity Present  . Worried About Charity fundraiser in the Last Year: Sometimes true  . Ran Out of Food in the Last Year: Sometimes true  Transportation Needs: No Transportation Needs  . Lack of Transportation (Medical): No  . Lack of Transportation (Non-Medical): No  Physical Activity: Inactive  . Days of Exercise per Week: 0 days  . Minutes of Exercise per Session: 0 min  Stress: Not on file  Social Connections: Unknown  . Frequency of Communication with Friends and Family: More than three times a week  . Frequency of Social Gatherings with Friends and Family: More than three times a week  . Attends Religious Services: Not on file  . Active Member of Clubs or Organizations: Not on file  . Attends Archivist Meetings: Not on file  . Marital Status: Not on file  Intimate Partner Violence: Not on file    Review of Systems: Gen: Denies any fever, chills, sweats, anorexia, fatigue, weakness, malaise, weight loss, and sleep disorder HEENT: No visual complaints, No history of Retinopathy. Normal  external appearance No Epistaxis or Sore throat. No sinusitis.   CV: History of congestive heart failure, atrial fibrillation rapid ventricular rate and the coagulopathy Resp:  COVID-19 pneumonia GI: Denies vomiting blood, jaundice, and fecal incontinence.   Denies dysphagia or odynophagia. GU : Denies urinary burning, blood in urine, urinary frequency, urinary hesitancy, nocturnal urination, and urinary incontinence.  No renal calculi.  Status post renal transplant 2011 Baseline creatinine 2.5 mg/dL MS: Denies joint pain, limitation of movement, and swelling, stiffness, low back pain, extremity pain. Denies muscle weakness, cramps, atrophy.  No use of non steroidal antiinflammatory drugs. Derm: Denies rash, itching, dry skin, hives, moles, warts, or unhealing ulcers.  Psych: Denies depression, anxiety, memory loss, suicidal ideation, hallucinations, paranoia, and confusion. Heme: Chronic anemia Neuro: No headache.  No diplopia. No dysarthria.  No dysphasia.  No history of CVA.  No Seizures. No paresthesias.  No weakness. Endocrine history of diabetes  Physical Exam: Vital signs in last 24 hours: Temp:  [97.8 F (36.6 C)-98.5 F (36.9 C)] 97.8 F (36.6 C) (02/16 0500) Pulse Rate:  [60-67] 60 (02/16 0500) Resp:  [15-22] 18 (02/16 0500) BP: (101-187)/(72-99) 187/87 (02/16 0500) SpO2:  [99 %-100 %] 100 % (02/16 0500) Weight:  [119.8 kg] 119.8 kg (02/16 0557) Last BM Date:  (pta)   General:   Elderly lady nondistressed Head:  Normocephalic and atraumatic. Eyes:  Sclera clear, no icterus.   Conjunctiva pink. Ears:  Normal auditory acuity. Nose:  No deformity,  discharge,  or lesions. Mouth:  No deformity or lesions, dentition normal. Neck:  Supple; no masses or thyromegaly.  JVP elevated Lungs: Crackles bilaterally Heart: Irregular rate and rhythm Abdomen:  Soft, nontender and nondistended. No masses, hepatosplenomegaly or hernias noted. Normal bowel sounds, without guarding, and without  rebound.   Msk:  Symmetrical without gross deformities. Normal posture. Pulses:  No carotid, renal, femoral bruits. DP and PT symmetrical and equal Extremities: 1+ pitting edema bilaterally appears chronic Neurologic:  Alert and  oriented x4;  grossly normal neurologically. Skin:  Intact without significant lesions or rashes. Cervical Nodes:  No significant cervical adenopathy. Psych:  Alert and cooperative. Normal mood and affect.  Intake/Output from previous day: 02/15 0701 - 02/16 0700 In: 810 [Blood:660; IV Piggyback:150] Out: 1500 [Urine:1500] Intake/Output this shift: No intake/output data recorded.  Lab Results: Recent Labs    08/14/2020 0915 09/03/2020 2040 08/23/20 0250  WBC 2.3*  --  2.4*  HGB 6.1* 7.4* 7.8*  HCT 19.4* 23.7* 23.7*  PLT 81*  --  89*   BMET Recent Labs    08/08/2020 0915 08/23/20 0250  NA 142 142  K 3.9 3.9  CL 106 106  CO2 21* 21*  GLUCOSE 109* 162*  BUN 86* 84*  CREATININE 3.98* 3.94*  CALCIUM 5.7* 5.8*  PHOS  --  5.5*   LFT Recent Labs    08/23/20 0250  PROT 5.5*  ALBUMIN 3.1*  AST 15  ALT 13  ALKPHOS 489*  BILITOT 0.5   PT/INR No results for input(s): LABPROT, INR in the last 72 hours. Hepatitis Panel No results for input(s): HEPBSAG, HCVAB, HEPAIGM, HEPBIGM in the last 72 hours.  Studies/Results: DG Chest Port 1 View  Result Date: 08/29/2020 CLINICAL DATA:  Cough. EXAM: PORTABLE CHEST 1 VIEW COMPARISON:  07/08/2020. FINDINGS: Interim removal of left IJ line. Cardiomegaly again noted. Persistent dense bibasilar atelectasis scratched it persistent bibasilar atelectasis/infiltrates. Bilateral interstitial prominence again noted consistent interstitial edema and or pneumonitis. Slight improvement from prior exam. Small bilateral pleural effusions again noted. No pneumothorax. IMPRESSION: 1. Persistent bibasilar atelectasis/infiltrates and small bilateral pleural effusions. 2. Cardiomegaly again noted. Bilateral interstitial prominence  again noted consistent with interstitial edema and or pneumonitis. Slight improvement from prior exam. 3. Interim removal of left IJ line.  No pneumothorax. Electronically Signed   By: Marcello Moores  Register   On: 08/31/2020 07:49    Assessment/Plan:  Acute kidney injury.  Renal ultrasound performed 07/06/2020 was essentially unremarkable.  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  It is of note that she has been diagnosed with worsening heart failure.  It is conceivable that this is secondary to cardiorenal renal syndrome.   Urinalysis shows 0-5 WBCs 0-5 RBCs per high-power field.  At this point there are no indications for emergent dialysis  Anemia.  Now appears to have some pancytopenia.  Hematology to be consulted.  We will check some iron studies and continue ESA therapy would recommend transfusion as needed  Immunosuppression in the face of Covid pneumonia severe infection and pancytopenia.  Would favor holding CellCept at this time.  Continue Prograf and steroids Prograf dose 3 mg twice daily   Covid pneumonia as per primary service high-dose IV Solu-Medrol  Hypertension/volume continue diuretics.  Would increase oral dose of diuretics to 80 mg of torsemide twice daily.  Hypocalcemia continue vitamin D and calcium.  We will continue to follow.  Congestive heart failure with systolic dysfunction we will continue to follow.   LOS: 0 Sherril Croon @TODAY @9 :53 AM

## 2020-08-24 ENCOUNTER — Other Ambulatory Visit: Payer: Self-pay

## 2020-08-24 DIAGNOSIS — I5041 Acute combined systolic (congestive) and diastolic (congestive) heart failure: Secondary | ICD-10-CM | POA: Diagnosis not present

## 2020-08-24 DIAGNOSIS — N184 Chronic kidney disease, stage 4 (severe): Secondary | ICD-10-CM | POA: Diagnosis not present

## 2020-08-24 DIAGNOSIS — U071 COVID-19: Secondary | ICD-10-CM | POA: Diagnosis not present

## 2020-08-24 DIAGNOSIS — D649 Anemia, unspecified: Secondary | ICD-10-CM | POA: Diagnosis not present

## 2020-08-24 LAB — CBC
HCT: 24.2 % — ABNORMAL LOW (ref 36.0–46.0)
HCT: 25.5 % — ABNORMAL LOW (ref 36.0–46.0)
Hemoglobin: 7.5 g/dL — ABNORMAL LOW (ref 12.0–15.0)
Hemoglobin: 7.8 g/dL — ABNORMAL LOW (ref 12.0–15.0)
MCH: 29.2 pg (ref 26.0–34.0)
MCH: 29 pg (ref 26.0–34.0)
MCHC: 31 g/dL (ref 30.0–36.0)
MCHC: 30.6 g/dL (ref 30.0–36.0)
MCV: 94.2 fL (ref 80.0–100.0)
MCV: 94.8 fL (ref 80.0–100.0)
Platelets: 98 10*3/uL — ABNORMAL LOW (ref 150–400)
Platelets: 101 10*3/uL — ABNORMAL LOW (ref 150–400)
RBC: 2.57 MIL/uL — ABNORMAL LOW (ref 3.87–5.11)
RBC: 2.69 MIL/uL — ABNORMAL LOW (ref 3.87–5.11)
RDW: 16.8 % — ABNORMAL HIGH (ref 11.5–15.5)
RDW: 16.9 % — ABNORMAL HIGH (ref 11.5–15.5)
WBC: 2.1 10*3/uL — ABNORMAL LOW (ref 4.0–10.5)
WBC: 2.9 10*3/uL — ABNORMAL LOW (ref 4.0–10.5)
nRBC: 0 % (ref 0.0–0.2)
nRBC: 0 % (ref 0.0–0.2)

## 2020-08-24 LAB — APTT: aPTT: 37 seconds — ABNORMAL HIGH (ref 24–36)

## 2020-08-24 LAB — FIBRINOGEN: Fibrinogen: 328 mg/dL (ref 210–475)

## 2020-08-24 LAB — BASIC METABOLIC PANEL
Anion gap: 14 (ref 5–15)
BUN: 89 mg/dL — ABNORMAL HIGH (ref 6–20)
CO2: 21 mmol/L — ABNORMAL LOW (ref 22–32)
Calcium: 5.5 mg/dL — CL (ref 8.9–10.3)
Chloride: 107 mmol/L (ref 98–111)
Creatinine, Ser: 4.01 mg/dL — ABNORMAL HIGH (ref 0.44–1.00)
GFR, Estimated: 12 mL/min — ABNORMAL LOW (ref >60)
Glucose, Bld: 189 mg/dL — ABNORMAL HIGH (ref 70–99)
Potassium: 4.1 mmol/L (ref 3.5–5.1)
Sodium: 142 mmol/L (ref 135–145)

## 2020-08-24 LAB — HEPATIC FUNCTION PANEL
ALT: 13 U/L (ref 0–44)
AST: 16 U/L (ref 15–41)
Albumin: 2.8 g/dL — ABNORMAL LOW (ref 3.5–5.0)
Alkaline Phosphatase: 398 U/L — ABNORMAL HIGH (ref 38–126)
Bilirubin, Direct: <0.1 mg/dL (ref 0.0–0.2)
Total Bilirubin: 0.5 mg/dL (ref 0.3–1.2)
Total Protein: 4.8 g/dL — ABNORMAL LOW (ref 6.5–8.1)

## 2020-08-24 LAB — FERRITIN: Ferritin: 457 ng/mL — ABNORMAL HIGH (ref 11–307)

## 2020-08-24 LAB — PROTIME-INR
INR: 1.3 — ABNORMAL HIGH (ref 0.8–1.2)
Prothrombin Time: 16.1 seconds — ABNORMAL HIGH (ref 11.4–15.2)

## 2020-08-24 LAB — C-REACTIVE PROTEIN: CRP: 0.6 mg/dL (ref <1.0)

## 2020-08-24 LAB — PHOSPHORUS: Phosphorus: 5.7 mg/dL — ABNORMAL HIGH (ref 2.5–4.6)

## 2020-08-24 LAB — MAGNESIUM: Magnesium: 1.6 mg/dL — ABNORMAL LOW (ref 1.7–2.4)

## 2020-08-24 LAB — D-DIMER, QUANTITATIVE: D-Dimer, Quant: 2.83 ug/mL-FEU — ABNORMAL HIGH (ref 0.00–0.50)

## 2020-08-24 MED ORDER — CALCIUM GLUCONATE-NACL 2-0.675 GM/100ML-% IV SOLN
2.0000 g | Freq: Once | INTRAVENOUS | Status: AC
  Administered 2020-08-24: 2000 mg via INTRAVENOUS
  Filled 2020-08-24: qty 100

## 2020-08-24 MED ORDER — MAGNESIUM SULFATE 2 GM/50ML IV SOLN
2.0000 g | Freq: Once | INTRAVENOUS | Status: AC
  Administered 2020-08-24: 2 g via INTRAVENOUS
  Filled 2020-08-24: qty 50

## 2020-08-24 MED ORDER — CALCITRIOL 0.25 MCG PO CAPS
2.0000 ug | ORAL_CAPSULE | Freq: Two times a day (BID) | ORAL | Status: DC
  Administered 2020-08-24: 2 ug via ORAL
  Filled 2020-08-24: qty 8

## 2020-08-24 NOTE — Progress Notes (Signed)
Chief Complaint: Patient was seen in consultation today for bone marrow biopsy  Referring Physician(s): Dr. Chryl Heck  Supervising Physician: Mir, Sharen Heck  Patient Status: [facility] - In-pt  History of Present Illness: [name] is a 55 y.o. female with hx of renal failure s/p renal transplant years ago. Has some chronic anemia but has now been admitted with symptomatic anemia as well as Covid pneumonia. Hematology was consulted and requests for bone marrow biopsy. She seems to be otherwise stable with no respiratory distress. IR is asked to perform bone marrow bx PMHx, meds, labs, imaging, allergies reviewed. Feels pretty good, sitting up in bed, eating lunch.    Past Medical History:  Diagnosis Date  . Acute heart failure ([place]) 06/2020  . Blood transfusion without reported diagnosis   . CHF (congestive heart failure) ([place])   . 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 GERD (gastroesophageal reflux disease)    medication induced  . Hypertension   . Renal failure    hx of  . 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 [place]  . CHOLECYSTECTOMY  2008  . COLONOSCOPY  10 years ago   Dr.Hung="normal"  . COLONOSCOPY WITH PROPOFOL N/A 08/05/2020   Procedure: COLONOSCOPY WITH PROPOFOL;  Surgeon: Carol Ada, MD;  Location: [facility];  Service: Endoscopy;  Laterality: N/A;  . DG AV DIALYSIS  SHUNT ACCESS EXIST*L* OR    . ESOPHAGOGASTRODUODENOSCOPY (EGD) WITH PROPOFOL N/A 08/05/2020   Procedure: ESOPHAGOGASTRODUODENOSCOPY (EGD) WITH PROPOFOL;  Surgeon: Carol Ada, MD;  Location: [facility];  Service: Endoscopy;   Laterality: N/A;  . KIDNEY TRANSPLANT  2011  . UMBILICAL HERNIA REPAIR  2009    Allergies: Infed [iron dextran], Cefazolin, Ancef [cefazolin sodium], Lisinopril, and Other  Medications:  Current Facility-Administered Medications:  .  albuterol (VENTOLIN HFA) 108 (90 Base) MCG/ACT inhaler 2 puff, 2 puff, Inhalation, Q6H, [name], [name], DO, 2 puff at 08/24/20 0822 .  amiodarone (PACERONE) tablet 200 mg, 200 mg, Oral, Daily, [name], [name], DO, 200 mg at 08/24/20 0916 .  atorvastatin (LIPITOR) tablet 40 mg, 40 mg, Oral, Daily, [name], [name], DO, 40 mg at 08/24/20 0916 .  calcitRIOL (ROCALTROL) capsule 2 mcg, 2 mcg, Oral, BID, Edrick Oh, MD .  calcium-vitamin D (OSCAL WITH D) 500-200 MG-UNIT per tablet 1 tablet, 1 tablet, Oral, TID, [name], [name], DO, 1 tablet at 08/24/20 0916 .  [START ON 08/26/2020] Darbepoetin Alfa (ARANESP) injection 60 mcg, 60 mcg, Subcutaneous, Q Sat-1800, [name], [name], DO .  dexamethasone (DECADRON) tablet 6 mg, 6 mg, Oral, Q24H, [name], [name], DO, 6 mg at 08/24/20 1307 .  guaiFENesin-dextromethorphan (ROBITUSSIN DM) 100-10 MG/5ML syrup 10 mL, 10 mL, Oral, Q4H PRN, [name], [name], DO, 10 mL at 09/02/2020 1954 .  hydrocortisone cream 1 % 1 application, 1 application, Topical, BID PRN, Iona Beard, MD, 1 application at 54/65/03 [phone] .  loratadine (CLARITIN) tablet 10 mg, 10 mg, Oral, Daily PRN, [name], [name], DO .  metoprolol succinate (TOPROL-XL) 24 hr tablet 25 mg, 25 mg, Oral, Daily, [name], [name], DO, 25 mg at 08/24/20 0930 .  pantoprazole (PROTONIX) EC tablet 40 mg, 40 mg, Oral, Daily, [name], [name], DO, 40 mg at 08/24/20 0916 .  polyethylene glycol (MIRALAX / GLYCOLAX) packet 17 g, 17 g, Oral, Daily, Iona Beard, MD, 17 g at 08/24/20 0916 .  senna (SENOKOT) tablet 8.6 mg, 1 tablet, Oral, Daily PRN, Iona Beard, MD .  sevelamer carbonate (RENVELA) tablet 800 mg, 800 mg, Oral, TID WC, [name], [name], DO, 800 mg at  08/24/20 1307 .  tacrolimus (PROGRAF) capsule 5 mg, 5 mg, Oral, BID, [name], [name], DO, 5 mg at 08/24/20 0819 .  torsemide (DEMADEX) tablet 80 mg, 80 mg, Oral, BID, Edrick Oh, MD, 80 mg at 08/24/20 0819 .  vitamin B-12 (CYANOCOBALAMIN) tablet 1,000 mcg, 1,000 mcg, Oral, Daily, [name], [name], DO, 1,000 mcg at 08/24/20 0916 .  witch hazel-glycerin (TUCKS) pad, , Topical, PRN, [name], [name], DO    Family History  Problem Relation Age of Onset  . Hypertension Mother   . Diabetes Mother   . Atrial fibrillation Father   . Hypertension Father   . Arthritis Father   . Heart failure Sister   . Hypertension Sister        all 4 sisters  . Hypertension Brother        both brothers  . Diabetes Brother   . Colon cancer Neg Hx   . Stomach cancer Neg Hx     Social History   Socioeconomic History  . Marital status: Single    Spouse name: Not on file  . Number of children: 2  . Years of education: 35  . Highest education level: Not on file  Occupational History    Employer: UNEMPLOYED  Tobacco Use  . Smoking status: Former Smoker    Packs/day: 0.50    Types: Cigarettes  . Smokeless tobacco: Never Used  . Tobacco comment: 10 some days   Vaping Use  . Vaping Use: Never used  Substance and Sexual Activity  . Alcohol use: Never    Alcohol/week: 0.0 standard drinks  . Drug use: Never  . Sexual activity: Not Currently  Other Topics Concern  . 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 Difficulty of Paying Living Expenses: Somewhat hard  Food Insecurity: Food Insecurity Present  .  Worried About Charity fundraiser in the Last Year: Sometimes true  . Ran Out of Food in the Last Year: Sometimes true  Transportation Needs: No Transportation Needs  . Lack of Transportation (Medical): No  . Lack of Transportation (Non-Medical): No  Physical Activity: Inactive  . Days of Exercise per Week: 0 days  . Minutes of Exercise per Session: 0 min  Stress: Not on file  Social Connections: Unknown  . Frequency of Communication with Friends and Family: More than three times a week  . Frequency of Social Gatherings with Friends and Family: More than three times a week  . Attends Religious Services: Not on file  . Active Member of Clubs or Organizations: Not on file  . Attends Club or  Organization Meetings: Not on file  . Marital Status: Not on file    Review of Systems: A 12 point ROS discussed and pertinent positives are indicated in the HPI above.  All other systems are negative.  Review of Systems  Vital Signs: BP (!) 147/71 (BP Location: Right Wrist)   Pulse 65   Temp 98.2 F (36.8 C) (Oral)   Resp 18   Wt 119.7 kg   LMP 11/05/2012   SpO2 96%   BMI 36.81 kg/m   Physical Exam Constitutional:      General: She is not in acute distress.    Appearance: Normal appearance. She is not ill-appearing.  HENT:     Mouth/Throat:     Mouth: Mucous membranes are moist.     Pharynx: Oropharynx is clear.  Cardiovascular:     Rate and Rhythm: Normal rate and regular rhythm.     Heart sounds: Normal heart sounds.  Pulmonary:     Effort: Pulmonary effort is normal. No respiratory distress.     Breath sounds: Normal breath sounds.  Abdominal:     General: Abdomen is flat.     Palpations: Abdomen is soft.  Skin:    General: Skin is warm and dry.  Neurological:     General: No focal deficit present.     Mental Status: She is alert and oriented to person, place, and time.  Psychiatric:        Mood and Affect: Mood normal.        Thought Content: Thought content normal.         Judgment: Judgment normal.      Imaging: DG Chest Port 1 View  Result Date: 08/24/2020 CLINICAL DATA:  Cough. EXAM: PORTABLE CHEST 1 VIEW COMPARISON:  07/08/2020. FINDINGS: Interim removal of left IJ line. Cardiomegaly again noted. Persistent dense bibasilar atelectasis scratched it persistent bibasilar atelectasis/infiltrates. Bilateral interstitial prominence again noted consistent interstitial edema and or pneumonitis. Slight improvement from prior exam. Small bilateral pleural effusions again noted. No pneumothorax. IMPRESSION: 1. Persistent bibasilar atelectasis/infiltrates and small bilateral pleural effusions. 2. Cardiomegaly again noted. Bilateral interstitial prominence again noted consistent with interstitial edema and or pneumonitis. Slight improvement from prior exam. 3. Interim removal of left IJ line.  No pneumothorax. Electronically Signed   By: Marcello Moores  Register   On: 08/23/2020 07:49    Labs:  CBC: Recent Labs    08/14/20 1034 08/10/2020 0915 08/29/2020 2040 08/23/20 0250 08/24/20 0121  WBC 7.8 2.3*  --  2.4* 2.1*  HGB 8.4* 6.1* 7.4* 7.8* 7.5*  HCT 26.2* 19.4* 23.7* 23.7* 24.2*  PLT 144* 81*  --  89* 98*    COAGS: Recent Labs    07/04/20 1813 08/02/20 1332 08/03/20 0637 08/03/20 1251 08/04/20 0719 08/04/20 1918 08/24/20 0121  INR 1.4* 1.7*  --  1.4*  --   --  1.3*  APTT 38* 37*   < > 69* 70* 59* 37*   < > = values in this interval not displayed.    BMP: Recent Labs    03/23/20 0455 03/24/20 0833 03/24/20 0833 04/03/20 1051 07/04/20 1813 08/14/20 1034 08/27/2020 0915 08/23/20 0250 08/24/20 0121  NA 141 138   < > 145*   < > 143 142 142 142  K 3.9 3.7  --  3.7   < > 4.0 3.9 3.9 4.1  CL 109 106  --  110*   < > 102 106 106 107  CO2 22 22  --  18*   < >  19* 21* 21* 21*  GLUCOSE 98 127*   < > 93   < > 125* 109* 162* 189*  BUN 38* 37*   < > 32*   < > 62* 86* 84* 89*  CALCIUM 8.4* 8.0*  --  8.2*   < > 6.8* 5.7* 5.8* 5.5*  CREATININE 2.59* 2.54*  --   2.19*   < > 3.15* 3.98* 3.94* 4.01*  GFRNONAA 20* 20*  --  24*   < > 16* 13* 13* 12*  GFRAA 23* 24*  --  28*  --  18*  --   --   --    < > = values in this interval not displayed.    LIVER FUNCTION TESTS: Recent Labs    08/14/20 1034 08/18/2020 0915 08/23/20 0250 08/24/20 0121  BILITOT 0.5 0.2* 0.5 0.5  AST $Re'13 15 15 16  'wcA$ ALT $R'12 14 13 13  'Tu$ ALKPHOS 601* 468* 489* 398*  PROT 5.6* 5.4* 5.5* 4.8*  ALBUMIN 3.7* 3.1* 3.1* 2.8*    TUMOR MARKERS: No results for input(s): AFPTM, CEA, CA199, CHROMGRNA in the last 8760 hours.  Assessment and Plan: Acute on chronic anemia/pancytopenia Covid PNA, stable from respiratory standpoint, only requiring 2L [place] O2 Will plan for CT/image guided bone marrow biopsy tomorrow, schedule permitting. Labs reviewed. Risks and benefits of bone marrow biopsy was discussed with the patient and/or patient's family including, but not limited to bleeding, infection, damage to adjacent structures or low yield requiring additional tests.  All of the questions were answered and there is agreement to proceed.  Consent obtained.   Thank you for this interesting consult.  I greatly enjoyed meeting SUELLA COGAR and look forward to participating in their care.  A copy of this report was sent to the requesting provider on this date.  Electronically Signed: Ascencion Dike, PA-C 08/24/2020, 1:39 PM   I spent a total of 20 minutes in face to face in clinical consultation, greater than 50% of which was counseling/coordinating care for BM bx

## 2020-08-24 NOTE — Progress Notes (Signed)
Patient not seen Labs reviewed, anemia stable Thrombocytopenia improving No hemolysis, fibrinogen is normal, no major concern for consumption. PT and PTT mildly elevated.  I discussed with radiology who will try to get her in for the BMB. We will see her as needed. Please call our service with any questions. We will also arrange for FU outpatient.

## 2020-08-24 NOTE — Plan of Care (Signed)

## 2020-08-24 NOTE — Progress Notes (Addendum)
HD#1 Subjective:  Overnight Events: None  Patient is seen at bedside. Reports feeling okay. No change  In SOB since yesterday. Used inhalers a couple times with helped. Denies pain.  Patient started using home oxygen since last hospitalization.   Plan to proceed with bone marrow biopsy with IR. Patient verbalizes understanding.   Objective:  Vital signs in last 24 hours: Vitals:   08/23/20 1504 08/23/20 2250 08/24/20 0435 08/24/20 0454  BP: (!) 170/96 (!) 142/74 (!) 147/71   Pulse: 72 61 65   Resp: _0 Temp: 98 F (36.7 C) 98.4 F (36.9 C) 98.2 F (36.8 C)   TempSrc: Oral Oral Oral   SpO2: 100% 100% 100%   Weight:    119.7 kg   Supplemental O2: Nasal [place]ula SpO2: 100 % O2 Flow Rate (L/min): 4 L/min   Physical Exam:  Constitution:Well appearing, well nourished,  NAD, sitting up in bed  HENT: [place]/AT Eyes: no icterus or injection, mild conjunctival pallor Cardio: RRR, no m/r/g, +1 pitting edema  Respiratory: On 4L [place] no respiratory distress, CTA, no wheezing Abdominal: normal BS, soft, NTTP, non-distended MSK: moving all extremities; RUE swollen, chronic Neuro: alert & oriented, normal affect  Skin: dependent induration and edema   Filed Weights   08/23/20 0557 08/24/20 0454  Weight: 119.8 kg 119.7 kg     Intake/Output Summary (Last 24 hours) at 08/24/2020 0553 Last data filed at 08/24/2020 0420 Gross per 24 hour  Intake 340 ml  Output 900 ml  Net -560 ml   Net IO Since Admission: -1,250 mL [08/24/20 0553]  Pertinent Labs: CBC Latest Ref Rng & Units 08/24/2020 08/23/2020 08/13/2020  WBC 4.0 - 10.5 K/uL 2.1(L) 2.4(L) -  Hemoglobin 12.0 - 15.0 g/dL 7.5(L) 7.8(L) 7.4(L)  Hematocrit 36.0 - 46.0 % 24.2(L) 23.7(L) 23.7(L)  Platelets 150 - 400 K/uL 98(L) 89(L) -    CMP Latest Ref Rng & Units 08/24/2020 08/23/2020 08/09/2020  Glucose 70 - 99 mg/dL 189(H) 162(H) 109(H)  BUN 6 - 20 mg/dL 89(H) 84(H) 86(H)  Creatinine 0.44 - 1.00 mg/dL 4.01(H) 3.94(H) 3.98(H)   Sodium 135 - 145 mmol/L 142 142 142  Potassium 3.5 - 5.1 mmol/L 4.1 3.9 3.9  Chloride 98 - 111 mmol/L 107 106 106  CO2 22 - 32 mmol/L 21(L) 21(L) 21(L)  Calcium 8.9 - 10.3 mg/dL 5.5(LL) 5.8(LL) 5.7(LL)  Total Protein 6.5 - 8.1 g/dL - 5.5(L) 5.4(L)  Total Bilirubin 0.3 - 1.2 mg/dL - 0.5 0.2(L)  Alkaline Phos 38 - 126 U/L - 489(H) 468(H)  AST 15 - 41 U/L - 15 15  ALT 0 - 44 U/L - 13 14     Imaging: No results found.  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   Recurrent Acute on Chronic Anemia Pancytopenia  Hgb stable at 7.5 this morning. No signs of active bleeding. Smear with leukopenia, normocytic anemia with variable cell size, tear drop cells, rare schistocytes,  and thrombocytopenia. Discussed with hematology, who feel that pancytopenia may be secondary to acute illness but recommended a inpatient bone marrow biopsy due to acute on chronic anemia without clear blood loss.  - IR consult placed appreciate recommendations - Discussed with GI, recommending outpatient capsule endoscopy if no hematological cause found - discussed with nephrology, will hold myfortic for now  - discussed with hematology, recommending inpatient BMB - hold eliquis, SCDs ordered  - continue ESA - Monitor CBC  Covid-19 Diagnosed 2/13 per patient. No change in breathing, mild cough may be improving. Baseline is around 2-3. Stable at 4L  currently. CRP 1.0>0.6, ddimer 3.3> 3.7>2.8, Ferritin 371>696>789. Inflammatory markers improving.  - supplemental O2 prn  - monitor inflammatory markers - Continue decadron 6 mg day 3/10 - flutter valve/IS - albuterol prn   Chronic Combined Systolic and Diastolic Heart Failure  Breathing is stable. Does not appear volume overloaded on exam. 900 mL uop overnight. Will continue on current diuretic dose.  - torsemide to 80 bid - cont. metoprolol - monitor renal function closely  - strict I/O - daily weights    CKD Stage V Hypocalcemia 2/2 Hyperparathyroidism 2/2 Bone Mineral Disease Cr with steady increase for the past several months up to 4 today. May be component of cardiorenal syndrome.  - Nephrology consulted, appreciate recommendations - Torsemide 80 mg twice daily - continue oscal 500-268m 3 times daily, increase calcitriol to 2 mcg twice daily  - cont. renvela - cont. Prograf, hold myfortic - holding prednisone while on decadron for COVID  Hypertension Home medications include hydralazine-isosorbide 37.5-20 tid, amio 200 mg qd, toprol 25 mg qd. Blood pressure currently 1381systolic.  - cont. Amio, toprol - Consider restarting isosorbide-hydral if she continues to be hypertensive   Diet: renal  VTE: SCDs IVF: none Code: full   Dispo: Anticipated discharge to Home in 3 days pending further workup.   LIona Beard MD 08/24/2020, 5:53 AM Pager: [phone] Please contact the on call pager after 5 pm and on weekends at [phone]

## 2020-08-24 NOTE — Progress Notes (Signed)
CRITICAL VALUE ALERT  Critical Value:  Calcium 5.5  Date & Time Notied:  08/24/20 0309  Provider Notified: Dr. Shon Baton  Orders Received/Actions taken: MD stated ok.

## 2020-08-24 NOTE — Progress Notes (Signed)
[facility] ROUNDING NOTE   Subjective:   Interval History:  55 year old lady with a history of renal transplant cadaveric 2011 appears like a baseline creatinine about 2.5 mg/dL based on notes from office.  However during her hospitalizations in January it looks like her creatinine has gradually been worsening.  On 08/06/2020 it did increase to 3.66 mg/dL she has a history of hypertension and congestive heart failure with a diminished ejection fraction of about 30%.  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  She was diagnosed with COVID-19 pneumonia 08/20/2020.  She has been admitted for Decadron and close monitoring. she has been evaluated by hematology and plans will be for a bone marrow biopsy as an inpatient.  Most recent hospitalizations #1 07/04/2020  -3/97/6734 complications of acute on chronic heart failure with a creatinine that had increased during hospitalization. Most recent hospitalizations #2   08/01/2020 -08/06/2020 with worsening anemia.  Blood pressure 147/71 pulse 65 temperature 98.2 O2 sats 1% 4 L nasal cannula.  Urine output 1.6 L 08/23/2020.  Status post transfusion 08/19/2020  Sodium 142 potassium 4.1 chloride 107 CO2 21 BUN 89 creatinine 4 glucose 189 calcium 7.5 phosphorus 5.7 magnesium 1.6 albumin 2.8.    Objective:  Vital signs in last 24 hours:  Temp:  [98 F (36.7 C)-98.4 F (36.9 C)] 98.2 F (36.8 C) (02/17 0435) Pulse Rate:  [61-72] 65 (02/17 0435) Resp:  [18-20] 18 (02/17 0435) BP: (142-170)/(71-96) 147/71 (02/17 0435) SpO2:  [100 %] 100 % (02/17 0435) Weight:  [119.7 kg] 119.7 kg (02/17 0454)  Weight change: -0.1 kg Filed Weights   08/23/20 0557 08/24/20 0454  Weight: 119.8 kg 119.7 kg    Intake/Output: I/O last 3 completed shifts: In: 490 [P.O.:240; IV Piggyback:250] Out: 1600 [LPFXT:0240]   Intake/Output this shift:  No intake/output data  recorded.  Elderly nondistressed CVS-irregular rate and rhythm JVP elevated RS-crackles bilaterally ABD- BS present soft non-distended EXT-1+ pitting edema   Basic Metabolic Panel: Recent Labs  Lab 08/11/2020 0915 08/23/20 0250 08/24/20 0121  NA 142 142 142  K 3.9 3.9 4.1  CL 106 106 107  CO2 21* 21* 21*  GLUCOSE 109* 162* 189*  BUN 86* 84* 89*  CREATININE 3.98* 3.94* 4.01*  CALCIUM 5.7* 5.8* 5.5*  MG  --  1.5* 1.6*  PHOS  --  5.5* 5.7*    Liver Function Tests: Recent Labs  Lab 08/25/2020 0915 08/23/20 0250 08/24/20 0121  AST $Re'15 15 16  'qFo$ ALT $R'14 13 13  'hP$ ALKPHOS 468* 489* 398*  BILITOT 0.2* 0.5 0.5  PROT 5.4* 5.5* 4.8*  ALBUMIN 3.1* 3.1* 2.8*   No results for input(s): LIPASE, AMYLASE in the last 168 hours. No results for input(s): AMMONIA in the last 168 hours.  CBC: Recent Labs  Lab 08/25/2020 0915 08/26/2020 2040 08/23/20 0250 08/24/20 0121  WBC 2.3*  --  2.4* 2.1*  NEUTROABS 1.9  --  2.0  --   HGB 6.1* 7.4* 7.8* 7.5*  HCT 19.4* 23.7* 23.7* 24.2*  MCV 95.6  --  92.6 94.2  PLT 81*  --  89* 98*    Cardiac Enzymes: No results for input(s): CKTOTAL, CKMB, CKMBINDEX, TROPONINI in the last 168 hours.  BNP: Invalid input(s): POCBNP  CBG: No results for input(s): GLUCAP in the last 168 hours.  Microbiology: Results for orders placed or performed during the hospital encounter of 08/23/2020  Resp Panel by RT-PCR (Flu A&B, Covid) Nasopharyngeal Swab  Status: Abnormal   Collection Time: 08/14/2020  9:51 AM   Specimen: Nasopharyngeal Swab; Nasopharyngeal(NP) swabs in vial transport medium  Result Value Ref Range Status   SARS Coronavirus 2 by RT PCR POSITIVE (A) NEGATIVE Final    Comment: RESULT CALLED TO, READ BACK BY AND VERIFIED WITH: RN MISSY E. [id number] (NOTE) SARS-CoV-2 target nucleic acids are DETECTED.  The SARS-CoV-2 RNA is generally detectable in upper respiratory specimens during the acute phase of infection. Positive results are indicative of  the presence of the identified virus, but do not rule out bacterial infection or co-infection with other pathogens not detected by the test. Clinical correlation with patient history and other diagnostic information is necessary to determine patient infection status. The expected result is Negative.  Fact Sheet for Patients: EntrepreneurPulse.com.au  Fact Sheet for Healthcare Providers: IncredibleEmployment.be  This test is not yet approved or cleared by the [place] FDA and  has been authorized for detection and/or diagnosis of SARS-CoV-2 by FDA under an Emergency Use Authorization (EUA).  This EUA will remain in effect (meaning this test can be used) for  the duration of  the COVID-19 declaration under Section 564(b)(1) of the Act, 21 U.S.C. section 360bbb-3(b)(1), unless the authorization is terminated or revoked sooner.     Influenza A by PCR NEGATIVE NEGATIVE Final   Influenza B by PCR NEGATIVE NEGATIVE Final    Comment: (NOTE) The Xpert Xpress SARS-CoV-2/FLU/RSV plus assay is intended as an aid in the diagnosis of influenza from Nasopharyngeal swab specimens and should not be used as a sole basis for treatment. Nasal washings and aspirates are unacceptable for Xpert Xpress SARS-CoV-2/FLU/RSV testing.  Fact Sheet for Patients: EntrepreneurPulse.com.au  Fact Sheet for Healthcare Providers: IncredibleEmployment.be  This test is not yet approved or cleared by the [place] FDA and has been authorized for detection and/or diagnosis of SARS-CoV-2 by FDA under an Emergency Use Authorization (EUA). This EUA will remain in effect (meaning this test can be used) for the duration of the COVID-19 declaration under Section 564(b)(1) of the Act, 21 U.S.C. section 360bbb-3(b)(1), unless the authorization is terminated or revoked.  Performed at [facility], [place] [address] [place],  [place] [address]   MRSA PCR Screening     Status: None   Collection Time: 08/23/2020 11:16 PM   Specimen: Nasal Mucosa; Nasopharyngeal  Result Value Ref Range Status   MRSA by PCR NEGATIVE NEGATIVE Final    Comment:        The GeneXpert MRSA Assay (FDA approved for NASAL specimens only), is one component of a comprehensive MRSA colonization surveillance program. It is not intended to diagnose MRSA infection nor to guide or monitor treatment for MRSA infections. Performed at [facility], [place] [address], [place], [place] [address]     Coagulation Studies: Recent Labs    08/24/20 0121  LABPROT 16.1*  INR 1.3*    Urinalysis: Recent Labs    08/21/2020 1419  COLORURINE YELLOW  LABSPEC 1.011  PHURINE 7.0  GLUCOSEU NEGATIVE  HGBUR MODERATE*  BILIRUBINUR NEGATIVE  KETONESUR NEGATIVE  PROTEINUR 100*  NITRITE NEGATIVE  LEUKOCYTESUR TRACE*      Imaging: No results found.   Medications:    . albuterol  2 puff Inhalation Q6H  . amiodarone  200 mg Oral Daily  . atorvastatin  40 mg Oral Daily  . calcitRIOL  0.5 mcg Oral BID  . calcium-vitamin D  1 tablet Oral TID  . [START ON 08/26/2020] Darbepoetin  Alfa  60 mcg Subcutaneous Q Sat-1800  . dexamethasone  6 mg Oral Q24H  . metoprolol succinate  25 mg Oral Daily  . pantoprazole  40 mg Oral Daily  . polyethylene glycol  17 g Oral Daily  . sevelamer carbonate  800 mg Oral TID WC  . tacrolimus  5 mg Oral BID  . torsemide  80 mg Oral BID  . vitamin B-12  1,000 mcg Oral Daily   guaiFENesin-dextromethorphan, hydrocortisone cream, loratadine, senna, witch hazel-glycerin  Assessment/ Plan:   Acute kidney injury.  Renal ultrasound performed 07/06/2020 was essentially unremarkable.  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  It is of note that she has been diagnosed with worsening heart failure.  It is conceivable  that this is secondary to cardiorenal renal syndrome.   Urinalysis shows 0-5 WBCs 0-5 RBCs per high-power field.  At this point there are no indications for emergent dialysis  Anemia.  Now appears to have some pancytopenia.  Hematology to be consulted.  Bone marrow biopsy being considered as inpatient.  Continue ESA therapy would recommend transfusion as needed  Immunosuppression in the face of Covid pneumonia severe infection and pancytopenia.  Would favor holding CellCept at this time.  Continue Prograf and steroids Prograf dose 5 mg twice daily   Covid pneumonia as per primary service high-dose IV Solu-Medrol  Hypertension/volume continue diuretics.  Would increase oral dose of diuretics to 80 mg of torsemide twice daily.  Hypocalcemia continue vitamin D and calcium.  We will continue to follow.  Will increase vitamin D to 2 mcg twice daily    LOS: 1 Sherril Croon $RemoveBefor'@TODAY'JdpNDvfPfXno$ $Rem'@10'Vmhc$ :33 AM

## 2020-08-24 NOTE — Plan of Care (Signed)
  Problem: Education: Goal: Knowledge of General Education information will improve Description: Including pain rating scale, medication(s)/side effects and non-pharmacologic comfort measures 08/24/2020 2220 by Tobe Sos, RN Outcome: Progressing 08/24/2020 2219 by Tobe Sos, RN Outcome: Progressing   Problem: Health Behavior/Discharge Planning: Goal: Ability to manage health-related needs will improve 08/24/2020 2220 by Tobe Sos, RN Outcome: Progressing 08/24/2020 2219 by Tobe Sos, RN Outcome: Progressing   Problem: Clinical Measurements: Goal: Ability to maintain clinical measurements within normal limits will improve 08/24/2020 2220 by Tobe Sos, RN Outcome: Progressing 08/24/2020 2219 by Tobe Sos, RN Outcome: Progressing Goal: Will remain free from infection 08/24/2020 2220 by Tobe Sos, RN Outcome: Progressing 08/24/2020 2219 by Tobe Sos, RN Outcome: Progressing Goal: Diagnostic test results will improve 08/24/2020 2220 by Tobe Sos, RN Outcome: Progressing 08/24/2020 2219 by Tobe Sos, RN Outcome: Progressing Goal: Respiratory complications will improve 08/24/2020 2220 by Tobe Sos, RN Outcome: Progressing 08/24/2020 2219 by Tobe Sos, RN Outcome: Progressing Goal: Cardiovascular complication will be avoided 08/24/2020 2220 by Tobe Sos, RN Outcome: Progressing 08/24/2020 2219 by Tobe Sos, RN Outcome: Progressing   Problem: Activity: Goal: Risk for activity intolerance will decrease 08/24/2020 2220 by Tobe Sos, RN Outcome: Progressing 08/24/2020 2219 by Tobe Sos, RN Outcome: Progressing   Problem: Nutrition: Goal: Adequate nutrition will be maintained 08/24/2020 2220 by Tobe Sos, RN Outcome: Progressing 08/24/2020 2219 by Tobe Sos, RN Outcome: Progressing   Problem: Coping: Goal: Level of anxiety will  decrease 08/24/2020 2220 by Tobe Sos, RN Outcome: Progressing 08/24/2020 2219 by Tobe Sos, RN Outcome: Progressing   Problem: Elimination: Goal: Will not experience complications related to bowel motility 08/24/2020 2220 by Tobe Sos, RN Outcome: Progressing 08/24/2020 2219 by Tobe Sos, RN Outcome: Progressing Goal: Will not experience complications related to urinary retention 08/24/2020 2220 by Tobe Sos, RN Outcome: Progressing 08/24/2020 2219 by Tobe Sos, RN Outcome: Progressing   Problem: Safety: Goal: Ability to remain free from injury will improve 08/24/2020 2220 by Tobe Sos, RN Outcome: Progressing 08/24/2020 2219 by Tobe Sos, RN Outcome: Progressing   Problem: Skin Integrity: Goal: Risk for impaired skin integrity will decrease 08/24/2020 2220 by Tobe Sos, RN Outcome: Progressing 08/24/2020 2219 by Tobe Sos, RN Outcome: Progressing

## 2020-08-25 ENCOUNTER — Inpatient Hospital Stay (HOSPITAL_COMMUNITY): Payer: Medicare Other

## 2020-08-25 DIAGNOSIS — N184 Chronic kidney disease, stage 4 (severe): Secondary | ICD-10-CM | POA: Diagnosis not present

## 2020-08-25 DIAGNOSIS — U071 COVID-19: Secondary | ICD-10-CM | POA: Diagnosis not present

## 2020-08-25 DIAGNOSIS — I5041 Acute combined systolic (congestive) and diastolic (congestive) heart failure: Secondary | ICD-10-CM | POA: Diagnosis not present

## 2020-08-25 DIAGNOSIS — D649 Anemia, unspecified: Secondary | ICD-10-CM | POA: Diagnosis not present

## 2020-08-25 LAB — CBC WITH DIFFERENTIAL/PLATELET
Abs Immature Granulocytes: 0.06 10*3/uL (ref 0.00–0.07)
Basophils Absolute: 0 10*3/uL (ref 0.0–0.1)
Basophils Relative: 0 %
Eosinophils Absolute: 0 10*3/uL (ref 0.0–0.5)
Eosinophils Relative: 0 %
HCT: 24.8 % — ABNORMAL LOW (ref 36.0–46.0)
Hemoglobin: 7.8 g/dL — ABNORMAL LOW (ref 12.0–15.0)
Immature Granulocytes: 2 %
Lymphocytes Relative: 12 %
Lymphs Abs: 0.3 10*3/uL — ABNORMAL LOW (ref 0.7–4.0)
MCH: 29.8 pg (ref 26.0–34.0)
MCHC: 31.5 g/dL (ref 30.0–36.0)
MCV: 94.7 fL (ref 80.0–100.0)
Monocytes Absolute: 0.2 10*3/uL (ref 0.1–1.0)
Monocytes Relative: 8 %
Neutro Abs: 2.2 10*3/uL (ref 1.7–7.7)
Neutrophils Relative %: 78 %
Platelets: 109 10*3/uL — ABNORMAL LOW (ref 150–400)
RBC: 2.62 MIL/uL — ABNORMAL LOW (ref 3.87–5.11)
RDW: 17 % — ABNORMAL HIGH (ref 11.5–15.5)
WBC: 2.8 10*3/uL — ABNORMAL LOW (ref 4.0–10.5)
nRBC: 0 % (ref 0.0–0.2)

## 2020-08-25 LAB — C-REACTIVE PROTEIN: CRP: <0.5 mg/dL (ref <1.0)

## 2020-08-25 LAB — COMPREHENSIVE METABOLIC PANEL
ALT: 16 U/L (ref 0–44)
AST: 22 U/L (ref 15–41)
Albumin: 3 g/dL — ABNORMAL LOW (ref 3.5–5.0)
Alkaline Phosphatase: 403 U/L — ABNORMAL HIGH (ref 38–126)
Anion gap: 14 (ref 5–15)
BUN: 90 mg/dL — ABNORMAL HIGH (ref 6–20)
CO2: 21 mmol/L — ABNORMAL LOW (ref 22–32)
Calcium: 5.6 mg/dL — CL (ref 8.9–10.3)
Chloride: 107 mmol/L (ref 98–111)
Creatinine, Ser: 4.06 mg/dL — ABNORMAL HIGH (ref 0.44–1.00)
GFR, Estimated: 12 mL/min — ABNORMAL LOW (ref >60)
Glucose, Bld: 224 mg/dL — ABNORMAL HIGH (ref 70–99)
Potassium: 4.2 mmol/L (ref 3.5–5.1)
Sodium: 142 mmol/L (ref 135–145)
Total Bilirubin: 0.5 mg/dL (ref 0.3–1.2)
Total Protein: 5.3 g/dL — ABNORMAL LOW (ref 6.5–8.1)

## 2020-08-25 LAB — PHOSPHORUS: Phosphorus: 5.2 mg/dL — ABNORMAL HIGH (ref 2.5–4.6)

## 2020-08-25 LAB — MAGNESIUM: Magnesium: 1.8 mg/dL (ref 1.7–2.4)

## 2020-08-25 LAB — FERRITIN: Ferritin: 588 ng/mL — ABNORMAL HIGH (ref 11–307)

## 2020-08-25 LAB — GLUCOSE, CAPILLARY
Glucose-Capillary: 150 mg/dL — ABNORMAL HIGH (ref 70–99)
Glucose-Capillary: 142 mg/dL — ABNORMAL HIGH (ref 70–99)

## 2020-08-25 LAB — D-DIMER, QUANTITATIVE: D-Dimer, Quant: 3.26 ug/mL-FEU — ABNORMAL HIGH (ref 0.00–0.50)

## 2020-08-25 MED ORDER — FENTANYL CITRATE (PF) 100 MCG/2ML IJ SOLN
INTRAMUSCULAR | Status: AC
  Filled 2020-08-25: qty 4

## 2020-08-25 MED ORDER — ISOSORB DINITRATE-HYDRALAZINE 20-37.5 MG PO TABS
1.5000 | ORAL_TABLET | Freq: Three times a day (TID) | ORAL | Status: DC
  Administered 2020-08-25 – 2020-08-27 (×8): 1.5 via ORAL
  Filled 2020-08-25 (×9): qty 2

## 2020-08-25 MED ORDER — CALCIUM GLUCONATE-NACL 1-0.675 GM/50ML-% IV SOLN
1.0000 g | Freq: Once | INTRAVENOUS | Status: AC
  Administered 2020-08-25: 1000 mg via INTRAVENOUS
  Filled 2020-08-25: qty 50

## 2020-08-25 MED ORDER — CALCIUM GLUCONATE-NACL 2-0.675 GM/100ML-% IV SOLN
2.0000 g | Freq: Once | INTRAVENOUS | Status: AC
  Administered 2020-08-25: 2000 mg via INTRAVENOUS
  Filled 2020-08-25: qty 100

## 2020-08-25 MED ORDER — CALCITRIOL 0.5 MCG PO CAPS
4.0000 ug | ORAL_CAPSULE | Freq: Two times a day (BID) | ORAL | Status: DC
  Administered 2020-08-25 – 2020-08-27 (×5): 4 ug via ORAL
  Filled 2020-08-25 (×7): qty 8

## 2020-08-25 MED ORDER — MIDAZOLAM HCL 2 MG/2ML IJ SOLN
INTRAMUSCULAR | Status: AC
  Filled 2020-08-25: qty 6

## 2020-08-25 MED ORDER — MIDAZOLAM HCL 2 MG/2ML IJ SOLN
INTRAMUSCULAR | Status: AC | PRN
  Administered 2020-08-25: 1 mg via INTRAVENOUS

## 2020-08-25 MED ORDER — WITCH HAZEL-GLYCERIN EX PADS
MEDICATED_PAD | CUTANEOUS | Status: DC | PRN
  Administered 2020-08-26 (×2): 1 via TOPICAL
  Filled 2020-08-25: qty 100

## 2020-08-25 MED ORDER — INSULIN ASPART 100 UNIT/ML ~~LOC~~ SOLN
0.0000 [IU] | Freq: Three times a day (TID) | SUBCUTANEOUS | Status: DC
  Administered 2020-08-25 (×2): 1 [IU] via SUBCUTANEOUS
  Administered 2020-08-26 – 2020-08-27 (×5): 2 [IU] via SUBCUTANEOUS

## 2020-08-25 MED ORDER — FENTANYL CITRATE (PF) 100 MCG/2ML IJ SOLN
INTRAMUSCULAR | Status: AC | PRN
  Administered 2020-08-25: 50 ug via INTRAVENOUS

## 2020-08-25 MED ORDER — APIXABAN 5 MG PO TABS
5.0000 mg | ORAL_TABLET | Freq: Two times a day (BID) | ORAL | Status: DC
  Administered 2020-08-26 – 2020-08-27 (×3): 5 mg via ORAL
  Filled 2020-08-25 (×3): qty 1

## 2020-08-25 MED ORDER — ORAL CARE MOUTH RINSE
15.0000 mL | Freq: Two times a day (BID) | OROMUCOSAL | Status: DC
  Administered 2020-08-25 – 2020-08-26 (×4): 15 mL via OROMUCOSAL

## 2020-08-25 MED ORDER — LIDOCAINE HCL 1 % IJ SOLN
INTRAMUSCULAR | Status: AC
  Filled 2020-08-25: qty 20

## 2020-08-25 NOTE — Progress Notes (Signed)
Called to see pt who had CT guided bone marrow biopsy. Apparently had some bleeding/oozing from procedure site.  Able to assess site, new dressing has been placed. No active bleeding, oozing noted. Area soft, NT, no hematoma.  Ascencion Dike PA-C Interventional Radiology 08/25/2020 3:06 PM

## 2020-08-25 NOTE — Progress Notes (Signed)
Spoke with lab and confirmed they received the STAT order for CBCD.  She said she would send someone right away, and is aware that the patient is scheduled to be picked up from IR at 8:30

## 2020-08-25 NOTE — Progress Notes (Signed)
Patient requesting prn cough med at this time. Reports she is coughing up minimal mucus yellow in color. Administered by request and as per order. Patient took med well without any complication.

## 2020-08-25 NOTE — Progress Notes (Signed)
Received a call from lab @ [phone] re: patient's calcium level is 5.6; MD notified via secure chat @ 3:40PM w/order for calcium gluconate IV. Order noted.

## 2020-08-25 NOTE — Progress Notes (Signed)
[facility] ROUNDING NOTE   Subjective:   Brief History:  55 year old lady with a history of renal transplant cadaveric 2011 appears like a baseline creatinine about 2.5 mg/dL based on notes from office.  However during her hospitalizations in January it looks like her creatinine has gradually been worsening.  On 08/06/2020 it did increase to 3.66 mg/dL. she has a history of hypertension and congestive heart failure with a diminished ejection fraction of about 30%.  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  She was diagnosed with COVID-19 pneumonia 08/20/2020.  She has been admitted for Decadron and close monitoring. she has been evaluated by hematology and plans will be for a bone marrow biopsy as an inpatient.  Most recent hospitalizations #1 07/04/2020  -10/04/760 complications of acute on chronic heart failure with a creatinine that had increased during hospitalization. Most recent hospitalizations #2   08/01/2020 -08/06/2020 with worsening anemia.  Interval History: Patient states she feels relatively well today.  Underwent bone marrow biopsy today.  Cough and shortness of breath overall improving.  Making good urine.    Objective:  Vital signs in last 24 hours:  Temp:  [97.7 F (36.5 C)-98.6 F (37 C)] 97.7 F (36.5 C) (02/18 1453) Pulse Rate:  [54-65] 60 (02/18 1453) Resp:  [11-20] 20 (02/18 1453) BP: (101-196)/(69-117) 196/87 (02/18 1453) SpO2:  [97 %-100 %] 100 % (02/18 1453) Weight:  [120.3 kg] 120.3 kg (02/18 0520)  Weight change: 0.6 kg Filed Weights   08/23/20 0557 08/24/20 0454 08/25/20 0520  Weight: 119.8 kg 119.7 kg 120.3 kg    Intake/Output: I/O last 3 completed shifts: In: 29 [P.O.:720; IV Piggyback:100] Out: 2000 [Urine:2000]   Intake/Output this shift:  Total I/O In: -  Out: 700 [Urine:700]  Elderly nondistressed, lying in bed CVS-irregular rate and rhythm  JVP elevated RS-crackles bilaterally ABD- BS present soft non-distended EXT-1+ pitting edema   Basic Metabolic Panel: Recent Labs  Lab 09/04/2020 0915 08/23/20 0250 08/24/20 0121 08/25/20 0150  NA 142 142 142 142  K 3.9 3.9 4.1 4.2  CL 106 106 107 107  CO2 21* 21* 21* 21*  GLUCOSE 109* 162* 189* 224*  BUN 86* 84* 89* 90*  CREATININE 3.98* 3.94* 4.01* 4.06*  CALCIUM 5.7* 5.8* 5.5* 5.6*  MG  --  1.5* 1.6* 1.8  PHOS  --  5.5* 5.7* 5.2*    Liver Function Tests: Recent Labs  Lab 08/25/2020 0915 08/23/20 0250 08/24/20 0121 08/25/20 0150  AST $Re'15 15 16 22  'ocF$ ALT $R'14 13 13 16  'RU$ ALKPHOS 468* 489* 398* 403*  BILITOT 0.2* 0.5 0.5 0.5  PROT 5.4* 5.5* 4.8* 5.3*  ALBUMIN 3.1* 3.1* 2.8* 3.0*   No results for input(s): LIPASE, AMYLASE in the last 168 hours. No results for input(s): AMMONIA in the last 168 hours.  CBC: Recent Labs  Lab 08/19/2020 0915 08/18/2020 2040 08/23/20 0250 08/24/20 0121 08/24/20 1838 08/25/20 1158  WBC 2.3*  --  2.4* 2.1* 2.9* 2.8*  NEUTROABS 1.9  --  2.0  --   --  2.2  HGB 6.1* 7.4* 7.8* 7.5* 7.8* 7.8*  HCT 19.4* 23.7* 23.7* 24.2* 25.5* 24.8*  MCV 95.6  --  92.6 94.2 94.8 94.7  PLT 81*  --  89* 98* 101* 109*    Cardiac Enzymes: No results for input(s): CKTOTAL, CKMB, CKMBINDEX, TROPONINI in the last 168 hours.  BNP: Invalid input(s): POCBNP  CBG: No results for input(s): GLUCAP in  the last 168 hours.  Microbiology: Results for orders placed or performed during the hospital encounter of 08/13/2020  Resp Panel by RT-PCR (Flu A&B, Covid) Nasopharyngeal Swab     Status: Abnormal   Collection Time: 09/04/2020  9:51 AM   Specimen: Nasopharyngeal Swab; Nasopharyngeal(NP) swabs in vial transport medium  Result Value Ref Range Status   SARS Coronavirus 2 by RT PCR POSITIVE (A) NEGATIVE Final    Comment: RESULT CALLED TO, READ BACK BY AND VERIFIED WITH: RN MISSY E. [id number] (NOTE) SARS-CoV-2 target nucleic acids are DETECTED.  The SARS-CoV-2 RNA is  generally detectable in upper respiratory specimens during the acute phase of infection. Positive results are indicative of the presence of the identified virus, but do not rule out bacterial infection or co-infection with other pathogens not detected by the test. Clinical correlation with patient history and other diagnostic information is necessary to determine patient infection status. The expected result is Negative.  Fact Sheet for Patients: BloggerCourse.com  Fact Sheet for Healthcare Providers: SeriousBroker.it  This test is not yet approved or cleared by the [place] FDA and  has been authorized for detection and/or diagnosis of SARS-CoV-2 by FDA under an Emergency Use Authorization (EUA).  This EUA will remain in effect (meaning this test can be used) for  the duration of  the COVID-19 declaration under Section 564(b)(1) of the Act, 21 U.S.C. section 360bbb-3(b)(1), unless the authorization is terminated or revoked sooner.     Influenza A by PCR NEGATIVE NEGATIVE Final   Influenza B by PCR NEGATIVE NEGATIVE Final    Comment: (NOTE) The Xpert Xpress SARS-CoV-2/FLU/RSV plus assay is intended as an aid in the diagnosis of influenza from Nasopharyngeal swab specimens and should not be used as a sole basis for treatment. Nasal washings and aspirates are unacceptable for Xpert Xpress SARS-CoV-2/FLU/RSV testing.  Fact Sheet for Patients: BloggerCourse.com  Fact Sheet for Healthcare Providers: SeriousBroker.it  This test is not yet approved or cleared by the [place] FDA and has been authorized for detection and/or diagnosis of SARS-CoV-2 by FDA under an Emergency Use Authorization (EUA). This EUA will remain in effect (meaning this test can be used) for the duration of the COVID-19 declaration under Section 564(b)(1) of the Act, 21 U.S.C. section 360bbb-3(b)(1),  unless the authorization is terminated or revoked.  Performed at [facility], [address] [place], [place] [address]   MRSA PCR Screening     Status: None   Collection Time: 08/14/2020 11:16 PM   Specimen: Nasal Mucosa; Nasopharyngeal  Result Value Ref Range Status   MRSA by PCR NEGATIVE NEGATIVE Final    Comment:        The GeneXpert MRSA Assay (FDA approved for NASAL specimens only), is one component of a comprehensive MRSA colonization surveillance program. It is not intended to diagnose MRSA infection nor to guide or monitor treatment for MRSA infections. Performed at [facility], [address], [place], [place] [address]     Coagulation Studies: Recent Labs    08/24/20 0121  LABPROT 16.1*  INR 1.3*    Urinalysis: No results for input(s): COLORURINE, LABSPEC, PHURINE, GLUCOSEU, HGBUR, BILIRUBINUR, KETONESUR, PROTEINUR, UROBILINOGEN, NITRITE, LEUKOCYTESUR in the last 72 hours.  Invalid input(s): APPERANCEUR    Imaging: No results found.   Medications:    . albuterol  2 puff Inhalation Q6H  . amiodarone  200 mg Oral Daily  . [START ON 08/26/2020] apixaban  5 mg Oral BID  . atorvastatin  40 mg Oral Daily  . calcitRIOL  4 mcg Oral BID  . calcium-vitamin D  1 tablet Oral TID  . [START ON 08/26/2020] Darbepoetin Alfa  60 mcg Subcutaneous Q Sat-1800  . dexamethasone  6 mg Oral Q24H  . fentaNYL      . insulin aspart  0-9 Units Subcutaneous TID WC  . isosorbide-hydrALAZINE  1.5 tablet Oral TID  . lidocaine      . mouth rinse  15 mL Mouth Rinse BID  . metoprolol succinate  25 mg Oral Daily  . midazolam      . pantoprazole  40 mg Oral Daily  . polyethylene glycol  17 g Oral Daily  . sevelamer carbonate  800 mg Oral TID WC  . tacrolimus  5 mg Oral BID  . torsemide  80 mg Oral BID  . vitamin B-12  1,000 mcg Oral Daily   guaiFENesin-dextromethorphan, hydrocortisone cream, loratadine, senna, witch hazel-glycerin  Assessment/ Plan:   Acute  kidney injury.    Baseline creatinine widely fluctuating between 2.5 and 3.5.  Renal ultrasound performed 07/06/2020 was essentially unremarkable.  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  It is of note that she has been diagnosed with worsening heart failure.  Recent worsening of kidney dysfunction could be related to Covid as well as possible cardiorenal syndrome.  Continue to monitor kidney function daily  Anemia.  Now appears to have some pancytopenia.  Hematology to be consulted.  Bone marrow biopsy performed today.  Continue ESA therapy would recommend transfusion as needed  Immunosuppression in the face of Covid pneumonia severe infection and pancytopenia.  Would favor holding CellCept at this time.  Continue Prograf and steroids Prograf dose 5 mg twice daily. Can restart cellcept as she improves  Covid pneumonia as per primary service on steroids  Hypertension/volume continue diuretics.    Continue oral torsemide twice daily  Hypocalcemia continue vitamin D and calcium.  We will continue to follow.  On calcitriol 4 MCG twice daily.  This was increased and may need to be decreased pending her course    LOS: 2 Reesa Chew $RemoveBe'@TODAY'bAiBLuYUn$ $Re'@3'Xbf$ :21 PM

## 2020-08-25 NOTE — Procedures (Signed)
Interventional Radiology Procedure Note  Procedure: Bone marrow biopsy  Indication: Symptomatic anemia  Findings: Please refer to procedural dictation for full description.  Complications: None  EBL: < 10 mL  Miachel Roux, MD [phone]

## 2020-08-25 NOTE — Progress Notes (Signed)
HD#2 Subjective:  Overnight Events: None  Patient is seen at bedside following procedure. Reports feeling okay. Notes slight improvement in her cough and breathing. She is eating and drinking well. Denies any abdominal pain. Currently on 4L O2 via [place].    Objective:  Vital signs in last 24 hours: Vitals:   08/25/20 0930 08/25/20 0935 08/25/20 0940 08/25/20 0946  BP: 121/87 102/90 120/83 130/82  Pulse: 65 61 60 61  Resp: 13 11 14 17   Temp:      TempSrc:      SpO2: 100% 100% 100% 100%  Weight:       Supplemental O2: Nasal [place] SpO2: 100 % O2 Flow Rate (L/min): 4 L/min   Physical Exam:  Constitution:Well appearing, well nourished,  NAD, sitting up in bed  HENT: [place]/AT Eyes: no icterus or injection, mild conjunctival pallor Cardio: RRR, no m/r/g, +1 pitting edema  Respiratory: On 4L [place] no respiratory distress, CTA, no wheezing Abdominal: normal BS, soft, NTTP, non-distended MSK: moving all extremities; RUE swollen, chronic, trace bilateral LE edema Neuro: alert & oriented, normal affect  Skin: dependent induration and edema   Filed Weights   08/23/20 0557 08/24/20 0454 08/25/20 0520  Weight: 119.8 kg 119.7 kg 120.3 kg     Intake/Output Summary (Last 24 hours) at 08/25/2020 1408 Last data filed at 08/25/2020 0546 Gross per 24 hour  Intake 480 ml  Output 1500 ml  Net -1020 ml   Net IO Since Admission: -2,770 mL [08/25/20 1408]  Pertinent Labs: CBC Latest Ref Rng & Units 08/25/2020 08/24/2020 08/24/2020  WBC 4.0 - 10.5 K/uL 2.8(L) 2.9(L) 2.1(L)  Hemoglobin 12.0 - 15.0 g/dL 7.8(L) 7.8(L) 7.5(L)  Hematocrit 36.0 - 46.0 % 24.8(L) 25.5(L) 24.2(L)  Platelets 150 - 400 K/uL 109(L) 101(L) 98(L)    CMP Latest Ref Rng & Units 08/25/2020 08/24/2020 08/23/2020  Glucose 70 - 99 mg/dL 224(H) 189(H) 162(H)  BUN 6 - 20 mg/dL 90(H) 89(H) 84(H)  Creatinine 0.44 - 1.00 mg/dL 4.06(H) 4.01(H) 3.94(H)  Sodium 135 - 145 mmol/L 142 142 142  Potassium 3.5 - 5.1 mmol/L 4.2 4.1 3.9   Chloride 98 - 111 mmol/L 107 107 106  CO2 22 - 32 mmol/L 21(L) 21(L) 21(L)  Calcium 8.9 - 10.3 mg/dL 5.6(LL) 5.5(LL) 5.8(LL)  Total Protein 6.5 - 8.1 g/dL 5.3(L) 4.8(L) 5.5(L)  Total Bilirubin 0.3 - 1.2 mg/dL 0.5 0.5 0.5  Alkaline Phos 38 - 126 U/L 403(H) 398(H) 489(H)  AST 15 - 41 U/L 22 16 15   ALT 0 - 44 U/L 16 13 13      Imaging: No results found.  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   Covid-19 Diagnosed 2/13 per patient. Remains stable at 4L currently. CRP normalized mild elevation of ferritin and ddimer.  - supplemental O2 prn, wean as tolerated - monitor inflammatory markers - Continue decadron 6 mg day /10 - flutter valve/IS - albuterol prn - PT/OT    Recurrent  Acute on Chronic Anemia Pancytopenia  Hgb stable at 7.8 this morning. WBC and platelets mildly improved. BMB performed this morning. Low suspicion of MAHA no signs of bleeding.Tolerated well. Reports mild aching but pain tolerable. CBC have been stable. Will follow up BMB for possible etiology of her anemia. - discussed with nephrology, will hold myfortic for now  - discussed with hematology, recommending inpatient BMB - follow up BMB results.  - continue ESA - Monitor CBC - restart apixiban tomorrow  Chronic Combined Systolic and Diastolic Heart Failure  Breathing is stable. Remains euvolemic on exam. 2L uop overnight. Will continue on  current diuretic dose.  - torsemide to 80 bid - cont. metoprolol - monitor renal function closely  - strict I/O - daily weights    CKD Stage V Hypocalcemia 2/2 Hyperparathyroidism 2/2 Bone Mineral Disease Cr with steady increase for the past several months. Stable since yesterday at 4 today. Calcium remains low with corrected Ca of 6.4.  - Nephrology consulted, appreciate recommendations - Torsemide 80 mg twice daily - continue oscal 500-200mg  3 times daily, calcitriol to 2 mcg twice daily  - IV calcium gluconate  - cont. renvela - cont. Prograf, hold myfortic - holding prednisone while on decadron for COVID  Hypertension Home medications include hydralazine-isosorbide 37.5-20 tid, amio 200 mg qd, toprol 25 mg qd. Blood pressure currently 631 systolic.  - cont. Amio, toprol - restart bildil   Diet: renal  VTE: SCDs, restart apixiban tomorrow IVF: none Code: full   Dispo: Anticipated discharge to Home in 3 days pending further workup.   Iona Beard, MD 08/25/2020, 2:08 PM Pager: [phone]  Please contact the on call pager after 5 pm and on weekends at [phone].

## 2020-08-25 NOTE — Progress Notes (Signed)
Spoke with Dr. Lisabeth Devoid.  Pt. Has an insulin sliding scale on the MAR, but does not have an order for CBG's.  She said she would put in order.

## 2020-08-25 NOTE — Progress Notes (Signed)
Reached out to IR, as patient's Biopsy site bled through bandaid and leaked onto chuck.  IR said to reach out to the PA, called PA office at [phone].  She said she would page [name] to my number.

## 2020-08-26 LAB — CBC
HCT: 22.2 % — ABNORMAL LOW (ref 36.0–46.0)
Hemoglobin: 7.3 g/dL — ABNORMAL LOW (ref 12.0–15.0)
MCH: 30.5 pg (ref 26.0–34.0)
MCHC: 32.9 g/dL (ref 30.0–36.0)
MCV: 92.9 fL (ref 80.0–100.0)
Platelets: 106 10*3/uL — ABNORMAL LOW (ref 150–400)
RBC: 2.39 MIL/uL — ABNORMAL LOW (ref 3.87–5.11)
RDW: 17 % — ABNORMAL HIGH (ref 11.5–15.5)
WBC: 3.4 10*3/uL — ABNORMAL LOW (ref 4.0–10.5)
nRBC: 0 % (ref 0.0–0.2)

## 2020-08-26 LAB — GLUCOSE, CAPILLARY
Glucose-Capillary: 158 mg/dL — ABNORMAL HIGH (ref 70–99)
Glucose-Capillary: 155 mg/dL — ABNORMAL HIGH (ref 70–99)
Glucose-Capillary: 152 mg/dL — ABNORMAL HIGH (ref 70–99)
Glucose-Capillary: 202 mg/dL — ABNORMAL HIGH (ref 70–99)

## 2020-08-26 LAB — BASIC METABOLIC PANEL
Anion gap: 13 (ref 5–15)
BUN: 87 mg/dL — ABNORMAL HIGH (ref 6–20)
CO2: 23 mmol/L (ref 22–32)
Calcium: 5.7 mg/dL — CL (ref 8.9–10.3)
Chloride: 105 mmol/L (ref 98–111)
Creatinine, Ser: 3.84 mg/dL — ABNORMAL HIGH (ref 0.44–1.00)
GFR, Estimated: 13 mL/min — ABNORMAL LOW (ref >60)
Glucose, Bld: 203 mg/dL — ABNORMAL HIGH (ref 70–99)
Potassium: 4 mmol/L (ref 3.5–5.1)
Sodium: 141 mmol/L (ref 135–145)

## 2020-08-26 LAB — FERRITIN: Ferritin: 522 ng/mL — ABNORMAL HIGH (ref 11–307)

## 2020-08-26 LAB — PHOSPHORUS: Phosphorus: 5.1 mg/dL — ABNORMAL HIGH (ref 2.5–4.6)

## 2020-08-26 LAB — C-REACTIVE PROTEIN: CRP: 0.5 mg/dL (ref <1.0)

## 2020-08-26 LAB — MAGNESIUM: Magnesium: 1.6 mg/dL — ABNORMAL LOW (ref 1.7–2.4)

## 2020-08-26 LAB — D-DIMER, QUANTITATIVE: D-Dimer, Quant: 3.53 ug/mL-FEU — ABNORMAL HIGH (ref 0.00–0.50)

## 2020-08-26 MED ORDER — CALCIUM GLUCONATE-NACL 2-0.675 GM/100ML-% IV SOLN
2.0000 g | Freq: Two times a day (BID) | INTRAVENOUS | Status: AC
  Administered 2020-08-26 (×2): 2000 mg via INTRAVENOUS
  Filled 2020-08-26 (×2): qty 100

## 2020-08-26 NOTE — Plan of Care (Signed)

## 2020-08-26 NOTE — Plan of Care (Signed)
  Problem: Education: Goal: Knowledge of General Education information will improve Description: Including pain rating scale, medication(s)/side effects and non-pharmacologic comfort measures Outcome: Progressing   Problem: Clinical Measurements: Goal: Respiratory complications will improve Outcome: Progressing   Problem: Activity: Goal: Risk for activity intolerance will decrease Outcome: Progressing   Problem: Nutrition: Goal: Adequate nutrition will be maintained Outcome: Progressing   Problem: Safety: Goal: Ability to remain free from injury will improve Outcome: Progressing   Problem: Skin Integrity: Goal: Risk for impaired skin integrity will decrease Outcome: Progressing   Problem: Health Behavior/Discharge Planning: Goal: Ability to manage health-related needs will improve Outcome: Not Progressing

## 2020-08-26 NOTE — Progress Notes (Signed)
Subjective: HD 4 Overnight, no acute events reported.  This morning, Ms [name] was evaluated at bedside. She is in no acute distress. She states feeling well and notes that her cough and respiratory status is better. She reports coughing up dark sputum but denies any blood in the sputum.   Objective:  Vital signs in last 24 hours: Vitals:   08/25/20 1200 08/25/20 1453 08/25/20 2109 08/26/20 0524  BP: (!) 174/62 (!) 196/87 (!) 149/65 (!) 158/77  Pulse:  60 62 70  Resp:  _0 Temp:  97.7 F (36.5 C) 98.4 F (36.9 C) 97.8 F (36.6 C)  TempSrc:  Oral Oral Axillary  SpO2: 100% 100% 100% 98%  Weight:    121.6 kg   CBC Latest Ref Rng & Units 08/26/2020 08/25/2020 08/24/2020  WBC 4.0 - 10.5 K/uL 3.4(L) 2.8(L) 2.9(L)  Hemoglobin 12.0 - 15.0 g/dL 7.3(L) 7.8(L) 7.8(L)  Hematocrit 36.0 - 46.0 % 22.2(L) 24.8(L) 25.5(L)  Platelets 150 - 400 K/uL 106(L) 109(L) 101(L)   BMP Latest Ref Rng & Units 08/26/2020 08/25/2020 08/24/2020  Glucose 70 - 99 mg/dL 203(H) 224(H) 189(H)  BUN 6 - 20 mg/dL 87(H) 90(H) 89(H)  Creatinine 0.44 - 1.00 mg/dL 3.84(H) 4.06(H) 4.01(H)  BUN/Creat Ratio 9 - 23 - - -  Sodium 135 - 145 mmol/L 141 142 142  Potassium 3.5 - 5.1 mmol/L 4.0 4.2 4.1  Chloride 98 - 111 mmol/L 105 107 107  CO2 22 - 32 mmol/L 23 21(L) 21(L)  Calcium 8.9 - 10.3 mg/dL 5.7(LL) 5.6(LL) 5.5(LL)   Physical Exam  Constitutional: Appears well-developed and well-nourished. No distress.  HENT: Normocephalic and atraumatic, EOMI, conjunctiva normal, moist mucous membranes Cardiovascular: Normal rate, regular rhythm, S1 and S2 present, no murmurs, rubs, gallops.  Distal pulses intact Respiratory: No respiratory distress, no accessory muscle use. Diffuse expiratory wheezing on exam GI: Nondistended, soft, nontender to palpation, active bowel sounds Musculoskeletal: Normal bulk and tone.  No peripheral edema noted. Neurological: Is alert and oriented x4, no apparent focal deficits noted. Skin:  Warm and dry.  No rash, erythema, lesions noted.  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  Acute hypoxic respiratory failure 2/2 COVID pneumonia Diagnosed 2/13; patient remains stable at 4L O2 via [place] (baseline ~3L). Persistent mild elevation of ferritin. She endorses improvement in her respiratory status. - Continue supplemental oxygen prn, wean to baseline as tolerated - Continue dexamethasone 46m daily, day 5/10 - Encourage ICS/flutter valve - Trend inflammatory markers - Albuterol prn  Recurrent acute on chronic anemia Pancytopenia This is stable this morning; slight improvement in leukopenia; anemia and thrombocytopenia remain stable. Patient underwent bone marrow biopsy on 2/18; results pending. Suspect her pancytopenia may be secondary to her chronic advanced renal disease with immunosuppression in setting of renal transplant history with acute COVID infection. - Follow up bone marrow biopsy results - Continue to trend CBC - Monitor for any signs of bleeding   CKD V; ESRD s/p renal transplant Hypocalcemia 2/2 secondary  hyperparathyroidism Slight improvement in renal function this morning with sCr improved 4>3.84 and increased urinary output.  Continue to have hypocalcemia.  Corrected serum calcium to 6.4 for which given IV calcium gluconate. -Nephrology consulted, appreciate recommendations - Continue torsemide 56m twice daily - Continue oscal tid,  calcitriol bid  - IV calcium gluconate given - Cont Renvela - Cont prograf  - Holding myfortic and prednisone for now  Hypertension - Continue BiDil, amiodarone, and Toprol XL  Code status: FULL Diet: Renal Fluids: None  VTE Prophylaxis: Eliquis  Prior to Admission Living Arrangement: Home Anticipated Discharge Location: SNF Barriers to Discharge: SNF placement, continued medical management  Dispo: Anticipated discharge in approximately 3-4 day(s).   AHarvie Heck MD  [facility] PGY-2 08/26/2020, 7:25 AM Pager: [phone] 5pm on weekdays and 1pm on weekends: On Call pager [phone]

## 2020-08-26 NOTE — Progress Notes (Signed)
Received a call from lab @ 04:22AM, pt's calcium level is CH 5.7. MD notified via secure chat at 4:30PM; no new orders (continue with current regimen).

## 2020-08-26 NOTE — Progress Notes (Signed)
[facility] ROUNDING NOTE   Subjective:   Brief History:  55 year old lady with a history of renal transplant cadaveric 2011 appears like a baseline creatinine about 2.5 mg/dL based on notes from office.  However during her hospitalizations in January it looks like her creatinine has gradually been worsening.  On 08/06/2020 it did increase to 3.66 mg/dL. she has a history of hypertension and congestive heart failure with a diminished ejection fraction of about 30%.  Atrial fibrillation and thrombophilia with an antithrombin III deficiency she presented 08/26/2020 with shortness of breath.  She was diagnosed with COVID-19 pneumonia 08/20/2020.  She has been admitted for Decadron and close monitoring. she has been evaluated by hematology and plans will be for a bone marrow biopsy as an inpatient.  Most recent hospitalizations #1 07/04/2020  -5/62/5638 complications of acute on chronic heart failure with a creatinine that had increased during hospitalization. Most recent hospitalizations #2   08/01/2020 -08/06/2020 with worsening anemia.  Interval History: Patient states she is feeling about the same.  No significant changes.  Creatinine slightly better.  Urine output less over the past 24 hours    Objective:  Vital signs in last 24 hours:  Temp:  [97.7 F (36.5 C)-98.4 F (36.9 C)] 97.8 F (36.6 C) (02/19 0524) Pulse Rate:  [60-70] 70 (02/19 0524) Resp:  [16-20] 16 (02/19 0524) BP: (149-196)/(65-87) 158/77 (02/19 0524) SpO2:  [98 %-100 %] 98 % (02/19 0524) Weight:  [121.6 kg] 121.6 kg (02/19 0524)  Weight change: 1.3 kg Filed Weights   08/24/20 0454 08/25/20 0520 08/26/20 0524  Weight: 119.7 kg 120.3 kg 121.6 kg    Intake/Output: I/O last 3 completed shifts: In: 1310 [P.O.:1310] Out: 1800 [Urine:1800]   Intake/Output this shift:  Total I/O In: 120 [P.O.:120] Out: 400 [Urine:400]  Elderly nondistressed, lying in bed CVS-irregular rate and rhythm JVP  elevated RS-crackles bilaterally ABD- BS present soft non-distended EXT-1+ pitting edema   Basic Metabolic Panel: Recent Labs  Lab 08/29/2020 0915 08/23/20 0250 08/24/20 0121 08/25/20 0150 08/26/20 0245  NA 142 142 142 142 141  K 3.9 3.9 4.1 4.2 4.0  CL 106 106 107 107 105  CO2 21* 21* 21* 21* 23  GLUCOSE 109* 162* 189* 224* 203*  BUN 86* 84* 89* 90* 87*  CREATININE 3.98* 3.94* 4.01* 4.06* 3.84*  CALCIUM 5.7* 5.8* 5.5* 5.6* 5.7*  MG  --  1.5* 1.6* 1.8 1.6*  PHOS  --  5.5* 5.7* 5.2* 5.1*    Liver Function Tests: Recent Labs  Lab 08/14/2020 0915 08/23/20 0250 08/24/20 0121 08/25/20 0150  AST $Re'15 15 16 22  'ZGZ$ ALT $R'14 13 13 16  'kv$ ALKPHOS 468* 489* 398* 403*  BILITOT 0.2* 0.5 0.5 0.5  PROT 5.4* 5.5* 4.8* 5.3*  ALBUMIN 3.1* 3.1* 2.8* 3.0*   No results for input(s): LIPASE, AMYLASE in the last 168 hours. No results for input(s): AMMONIA in the last 168 hours.  CBC: Recent Labs  Lab 08/28/2020 0915 08/23/2020 2040 08/23/20 0250 08/24/20 0121 08/24/20 1838 08/25/20 1158 08/26/20 0245  WBC 2.3*  --  2.4* 2.1* 2.9* 2.8* 3.4*  NEUTROABS 1.9  --  2.0  --   --  2.2  --   HGB 6.1*   < > 7.8* 7.5* 7.8* 7.8* 7.3*  HCT 19.4*   < > 23.7* 24.2* 25.5* 24.8* 22.2*  MCV 95.6  --  92.6 94.2 94.8 94.7 92.9  PLT 81*  --  89* 98* 101* 109* 106*   < > =  values in this interval not displayed.    Cardiac Enzymes: No results for input(s): CKTOTAL, CKMB, CKMBINDEX, TROPONINI in the last 168 hours.  BNP: Invalid input(s): POCBNP  CBG: Recent Labs  Lab 08/25/20 1821 08/25/20 2110 08/26/20 0749 08/26/20 1133  GLUCAP 150* 142* 158* 155*    Microbiology: Results for orders placed or performed during the hospital encounter of 08/11/2020  Resp Panel by RT-PCR (Flu A&B, Covid) Nasopharyngeal Swab     Status: Abnormal   Collection Time: 09/02/2020  9:51 AM   Specimen: Nasopharyngeal Swab; Nasopharyngeal(NP) swabs in vial transport medium  Result Value Ref Range Status   SARS Coronavirus 2 by  RT PCR POSITIVE (A) NEGATIVE Final    Comment: RESULT CALLED TO, READ BACK BY AND VERIFIED WITH: RN MISSY E. [phone] (NOTE) SARS-CoV-2 target nucleic acids are DETECTED.  The SARS-CoV-2 RNA is generally detectable in upper respiratory specimens during the acute phase of infection. Positive results are indicative of the presence of the identified virus, but do not rule out bacterial infection or co-infection with other pathogens not detected by the test. Clinical correlation with patient history and other diagnostic information is necessary to determine patient infection status. The expected result is Negative.  Fact Sheet for Patients: EntrepreneurPulse.com.au  Fact Sheet for Healthcare Providers: IncredibleEmployment.be  This test is not yet approved or cleared by the [place] FDA and  has been authorized for detection and/or diagnosis of SARS-CoV-2 by FDA under an Emergency Use Authorization (EUA).  This EUA will remain in effect (meaning this test can be used) for  the duration of  the COVID-19 declaration under Section 564(b)(1) of the Act, 21 U.S.C. section 360bbb-3(b)(1), unless the authorization is terminated or revoked sooner.     Influenza A by PCR NEGATIVE NEGATIVE Final   Influenza B by PCR NEGATIVE NEGATIVE Final    Comment: (NOTE) The Xpert Xpress SARS-CoV-2/FLU/RSV plus assay is intended as an aid in the diagnosis of influenza from Nasopharyngeal swab specimens and should not be used as a sole basis for treatment. Nasal washings and aspirates are unacceptable for Xpert Xpress SARS-CoV-2/FLU/RSV testing.  Fact Sheet for Patients: EntrepreneurPulse.com.au  Fact Sheet for Healthcare Providers: IncredibleEmployment.be  This test is not yet approved or cleared by the [place] FDA and has been authorized for detection and/or diagnosis of SARS-CoV-2 by FDA under an Emergency Use  Authorization (EUA). This EUA will remain in effect (meaning this test can be used) for the duration of the COVID-19 declaration under Section 564(b)(1) of the Act, 21 U.S.C. section 360bbb-3(b)(1), unless the authorization is terminated or revoked.  Performed at [facility], [place] [address] [place], [place] [address]   MRSA PCR Screening     Status: None   Collection Time: 08/10/2020 11:16 PM   Specimen: Nasal Mucosa; Nasopharyngeal  Result Value Ref Range Status   MRSA by PCR NEGATIVE NEGATIVE Final    Comment:        The GeneXpert MRSA Assay (FDA approved for NASAL specimens only), is one component of a comprehensive MRSA colonization surveillance program. It is not intended to diagnose MRSA infection nor to guide or monitor treatment for MRSA infections. Performed at [facility], [place] [address] [place], [place] [address]     Coagulation Studies: Recent Labs    08/24/20 0121  LABPROT 16.1*  INR 1.3*    Urinalysis: No results for input(s): COLORURINE, LABSPEC, PHURINE, GLUCOSEU, HGBUR, BILIRUBINUR, KETONESUR, PROTEINUR, UROBILINOGEN, NITRITE, LEUKOCYTESUR in the last 72  hours.  Invalid input(s): APPERANCEUR    Imaging: CT BONE MARROW BIOPSY & ASPIRATION  Result Date: 08/25/2020 INDICATION: Symptomatic anemia EXAM: CT GUIDED BONE MARROW ASPIRATES AND BIOPSY MEDICATIONS: None. ANESTHESIA/SEDATION: Fentanyl 100 mcg IV; Versed .  Two mg IV Moderate Sedation Time:  28 minutes The patient was continuously monitored during the procedure by the interventional radiology nurse under my direct supervision. COMPLICATIONS: None immediate. PROCEDURE: The procedure was explained to the patient. The risks and benefits of the procedure were discussed and the patient's questions were addressed. Informed consent was obtained from the patient. The patient was placed prone on CT table. Images of the pelvis were obtained. The right side of back was prepped and draped in sterile  fashion. The skin and right posterior ilium were anesthetized with 1% lidocaine. 11 gauge bone needle was directed into the right ilium with CT guidance. No blood could be aspirated. Two core biopsy were obtained. Bandage placed over the puncture site. IMPRESSION: CT guided bone marrow aspiration and core biopsy. No blood could be aspirated, therefore two 11 gauge cores were obtained. Electronically Signed   By: Miachel Roux M.D.   On: 08/25/2020 15:50     Medications:   . calcium gluconate 2,000 mg (08/26/20 1002)   . albuterol  2 puff Inhalation Q6H  . amiodarone  200 mg Oral Daily  . apixaban  5 mg Oral BID  . atorvastatin  40 mg Oral Daily  . calcitRIOL  4 mcg Oral BID  . calcium-vitamin D  1 tablet Oral TID  . Darbepoetin Alfa  60 mcg Subcutaneous Q Sat-1800  . dexamethasone  6 mg Oral Q24H  . insulin aspart  0-9 Units Subcutaneous TID WC  . isosorbide-hydrALAZINE  1.5 tablet Oral TID  . mouth rinse  15 mL Mouth Rinse BID  . metoprolol succinate  25 mg Oral Daily  . pantoprazole  40 mg Oral Daily  . polyethylene glycol  17 g Oral Daily  . sevelamer carbonate  800 mg Oral TID WC  . tacrolimus  5 mg Oral BID  . torsemide  80 mg Oral BID  . vitamin B-12  1,000 mcg Oral Daily   guaiFENesin-dextromethorphan, hydrocortisone cream, loratadine, senna, witch hazel-glycerin  Assessment/ Plan:   Acute kidney injury.    Baseline creatinine widely fluctuating between 2.5 and 3.5.  Renal ultrasound performed 07/06/2020 was essentially unremarkable.  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  It is of note that she has been diagnosed with worsening heart failure.  Recent worsening of kidney dysfunction could be related to Covid as well as possible cardiorenal syndrome.  Continue to monitor kidney function daily.  Slightly improved at this time  Anemia.  Now appears to have some  pancytopenia.  Hematology to be consulted.  Bone marrow biopsy performed on 2/18.  Continue ESA therapy would recommend transfusion as needed  Immunosuppression in the face of Covid pneumonia severe infection and pancytopenia.    Holding CellCept at this time. Continue Prograf and steroids Prograf dose 5 mg twice daily. Can restart cellcept as she continues to improves  Covid pneumonia as per primary service on steroids  Hypertension/volume continue diuretics.    Continue oral torsemide twice daily  Hypocalcemia continue vitamin D and calcium.  We will continue to follow.  On calcitriol 4 MCG twice daily.  This was increased and may need to be decreased pending her  course.  Continue IV repletion as ordered    LOS: 3 Reesa Chew $RemoveBe'@TODAY'Mnvrioyrt$ $Re'@12'qPs$ :26 PM

## 2020-08-26 NOTE — Evaluation (Signed)
Occupational Therapy Evaluation Patient Details Name: [name] MRN: [id number] DOB: 05/08/1966 Today's Date: 08/26/2020    History of Present Illness Pt is a 55 y.o. female dx with COVID-19 on 08/20/20, now admitted from [facility] on 08/26/2020 with anemia and hypocalcemia. CXR with chronic bilateral effusions. S/p bone marrow biopsy 2/18. PMH includes CKF (s/p renal transplant 2011), HTN, CHF, DM, anemia. Of note, recent admission from home 06/2021 with d/c to SNF.   Clinical Impression   Pt admitted with above. She demonstrates the below listed deficits and will benefit from continued OT to maximize safety and independence with BADLs.  Pt presents to OT with generalized weakness, decreased activity tolerance, and pain.  She currently requires set up - total A for ADLs.  She currently resides at [facility] and requires assist for ADLs and functional transfers.  Will follow acutely.       Follow Up Recommendations  SNF    Equipment Recommendations  None recommended by OT    Recommendations for Other Services       Precautions / Restrictions        Mobility Bed Mobility               General bed mobility comments: pt refused rolling due to pain, but moved herself into long sitting x 3 for 2-5 mins each attempt    Transfers                 General transfer comment: pt refused    Balance Overall balance assessment: Needs assistance Sitting-balance support: Bilateral upper extremity supported Sitting balance-Leahy Scale: Poor Sitting balance - Comments: requires UE support in long sitting                                   ADL either performed or assessed with clinical judgement   ADL Overall ADL's : Needs assistance/impaired Eating/Feeding: Independent   Grooming: Wash/dry hands;Wash/dry face;Oral care;Brushing hair;Set up;Supervision/safety;Bed level   Upper Body Bathing: Minimal assistance;Bed level   Lower Body Bathing: Maximal assistance;Bed  level   Upper Body Dressing : Moderate assistance;Bed level   Lower Body Dressing: Total assistance;Bed level   Toilet Transfer: Total assistance Toilet Transfer Details (indicate cue type and reason): unable to attempt safely                 Vision         Perception     Praxis      Pertinent Vitals/Pain Pain Assessment: Faces Faces Pain Scale: Hurts even more Pain Location: back and knees Pain Descriptors / Indicators: Aching Pain Intervention(s): Limited activity within patient's tolerance;Monitored during session;Repositioned     Hand Dominance Right   Extremity/Trunk Assessment Upper Extremity Assessment Upper Extremity Assessment: Generalized weakness (grossly 4/5)   Lower Extremity Assessment Lower Extremity Assessment: Defer to PT evaluation   Cervical / Trunk Assessment Cervical / Trunk Assessment: Normal   Communication Communication Communication: No difficulties   Cognition Arousal/Alertness: Awake/alert Behavior During Therapy: WFL for tasks assessed/performed Overall Cognitive Status: No family/caregiver present to determine baseline cognitive functioning                                 General Comments: Pt easliy self distracts   General Comments  VSS with pt on 2L supplemental 02    Exercises     Shoulder Instructions  Home Living Family/patient expects to be discharged to:: Skilled nursing facility                                 Additional Comments: Pt is from [facility] care      Prior Functioning/Environment Level of Independence: Needs assistance  Gait / Transfers Assistance Needed: Pt reports that she has been transferring to w/c with therapy and nsg staff at SNF with 2+ assisst ADL's / Homemaking Assistance Needed: Pt reports she was able to perform grooming and self feeding with set up assist, but requires assist with all other ADLs            OT Problem List: Decreased  strength;Decreased activity tolerance;Impaired balance (sitting and/or standing);Decreased safety awareness;Decreased cognition;Decreased knowledge of use of DME or AE;Cardiopulmonary status limiting activity;Pain;Obesity      OT Treatment/Interventions: Self-care/ADL training;Therapeutic exercise;Energy conservation;DME and/or AE instruction;Therapeutic activities;Cognitive remediation/compensation;Balance training;Patient/family education    OT Goals(Current goals can be found in the care plan section) Acute Rehab OT Goals Patient Stated Goal: to be able to eventually go home OT Goal Formulation: With patient Time For Goal Achievement: 09/08/20 Potential to Achieve Goals: Good ADL Goals Pt Will Perform Grooming: with min assist;sitting Pt Will Perform Upper Body Bathing: with min assist;sitting Pt Will Perform Lower Body Bathing: with mod assist;with adaptive equipment;bed level Pt/caregiver will Perform Home Exercise Program: Increased strength;With theraband  OT Frequency: Min 2X/week   Barriers to D/C:            Co-evaluation              AM-PAC OT "6 Clicks" Daily Activity     Outcome Measure Help from another person eating meals?: None Help from another person taking care of personal grooming?: A Little Help from another person toileting, which includes using toliet, bedpan, or urinal?: A Lot Help from another person bathing (including washing, rinsing, drying)?: A Lot Help from another person to put on and taking off regular upper body clothing?: A Lot Help from another person to put on and taking off regular lower body clothing?: Total 6 Click Score: 14   End of Session Equipment Utilized During Treatment: Oxygen Nurse Communication: Mobility status  Activity Tolerance: Patient limited by fatigue;Patient limited by pain Patient left: in bed;with call bell/phone within reach;with bed alarm set  OT Visit Diagnosis: Unsteadiness on feet (R26.81);Cognitive  communication deficit (R41.841);Pain Pain - part of body: Knee (back and bil. knees)                Time: 7341-9379 OT Time Calculation (min): 23 min Charges:  OT General Charges $OT Visit: 1 Visit OT Evaluation $OT Eval Moderate Complexity: 1 Mod OT Treatments $Therapeutic Activity: 8-22 mins  Nilsa Nutting., OTR/L Acute Rehabilitation Services Pager [phone] Office [phone]   Lucille Passy M 08/26/2020, 4:10 PM

## 2020-08-27 ENCOUNTER — Inpatient Hospital Stay (HOSPITAL_COMMUNITY): Payer: Medicare Other

## 2020-08-27 DIAGNOSIS — D649 Anemia, unspecified: Secondary | ICD-10-CM | POA: Diagnosis not present

## 2020-08-27 DIAGNOSIS — I5041 Acute combined systolic (congestive) and diastolic (congestive) heart failure: Secondary | ICD-10-CM | POA: Diagnosis not present

## 2020-08-27 DIAGNOSIS — U071 COVID-19: Secondary | ICD-10-CM | POA: Diagnosis not present

## 2020-08-27 DIAGNOSIS — N184 Chronic kidney disease, stage 4 (severe): Secondary | ICD-10-CM | POA: Diagnosis not present

## 2020-08-27 LAB — POCT I-STAT 7, (LYTES, BLD GAS, ICA,H+H)
Acid-Base Excess: 1 mmol/L (ref 0.0–2.0)
Bicarbonate: 26 mmol/L (ref 20.0–28.0)
Calcium, Ion: 0.83 mmol/L — CL (ref 1.15–1.40)
HCT: 22 % — ABNORMAL LOW (ref 36.0–46.0)
Hemoglobin: 7.5 g/dL — ABNORMAL LOW (ref 12.0–15.0)
O2 Saturation: 100 %
Patient temperature: 99.4
Potassium: 3.2 mmol/L — ABNORMAL LOW (ref 3.5–5.1)
Sodium: 142 mmol/L (ref 135–145)
TCO2: 27 mmol/L (ref 22–32)
pCO2 arterial: 42.4 mmHg (ref 32.0–48.0)
pH, Arterial: 7.397 (ref 7.350–7.450)
pO2, Arterial: 354 mmHg — ABNORMAL HIGH (ref 83.0–108.0)

## 2020-08-27 LAB — BLOOD GAS, ARTERIAL
Acid-base deficit: 5.2 mmol/L — ABNORMAL HIGH (ref 0.0–2.0)
Bicarbonate: 23 mmol/L (ref 20.0–28.0)
Drawn by: 39898
FIO2: 100
O2 Saturation: 97.1 %
Patient temperature: 37
pCO2 arterial: 74.3 mmHg (ref 32.0–48.0)
pH, Arterial: 7.117 — CL (ref 7.350–7.450)
pO2, Arterial: 123 mmHg — ABNORMAL HIGH (ref 83.0–108.0)

## 2020-08-27 LAB — CBC
HCT: 24.6 % — ABNORMAL LOW (ref 36.0–46.0)
Hemoglobin: 7.8 g/dL — ABNORMAL LOW (ref 12.0–15.0)
MCH: 29.9 pg (ref 26.0–34.0)
MCHC: 31.7 g/dL (ref 30.0–36.0)
MCV: 94.3 fL (ref 80.0–100.0)
Platelets: 134 10*3/uL — ABNORMAL LOW (ref 150–400)
RBC: 2.61 MIL/uL — ABNORMAL LOW (ref 3.87–5.11)
RDW: 16.8 % — ABNORMAL HIGH (ref 11.5–15.5)
WBC: 3.3 10*3/uL — ABNORMAL LOW (ref 4.0–10.5)
nRBC: 0 % (ref 0.0–0.2)

## 2020-08-27 LAB — CBC WITH DIFFERENTIAL/PLATELET
Abs Immature Granulocytes: 0.11 10*3/uL — ABNORMAL HIGH (ref 0.00–0.07)
Basophils Absolute: 0 10*3/uL (ref 0.0–0.1)
Basophils Relative: 0 %
Eosinophils Absolute: 0 10*3/uL (ref 0.0–0.5)
Eosinophils Relative: 0 %
HCT: 23 % — ABNORMAL LOW (ref 36.0–46.0)
Hemoglobin: 7 g/dL — ABNORMAL LOW (ref 12.0–15.0)
Immature Granulocytes: 2 %
Lymphocytes Relative: 5 %
Lymphs Abs: 0.2 10*3/uL — ABNORMAL LOW (ref 0.7–4.0)
MCH: 29.3 pg (ref 26.0–34.0)
MCHC: 30.4 g/dL (ref 30.0–36.0)
MCV: 96.2 fL (ref 80.0–100.0)
Monocytes Absolute: 0.4 10*3/uL (ref 0.1–1.0)
Monocytes Relative: 7 %
Neutro Abs: 4.5 10*3/uL (ref 1.7–7.7)
Neutrophils Relative %: 86 %
Platelets: 138 10*3/uL — ABNORMAL LOW (ref 150–400)
RBC: 2.39 MIL/uL — ABNORMAL LOW (ref 3.87–5.11)
RDW: 16.7 % — ABNORMAL HIGH (ref 11.5–15.5)
WBC: 5.3 10*3/uL (ref 4.0–10.5)
nRBC: 0 % (ref 0.0–0.2)

## 2020-08-27 LAB — PROTIME-INR
INR: 1.6 — ABNORMAL HIGH (ref 0.8–1.2)
Prothrombin Time: 18.8 seconds — ABNORMAL HIGH (ref 11.4–15.2)

## 2020-08-27 LAB — BASIC METABOLIC PANEL
Anion gap: 18 — ABNORMAL HIGH (ref 5–15)
BUN: 92 mg/dL — ABNORMAL HIGH (ref 6–20)
CO2: 18 mmol/L — ABNORMAL LOW (ref 22–32)
Calcium: 5.9 mg/dL — CL (ref 8.9–10.3)
Chloride: 102 mmol/L (ref 98–111)
Creatinine, Ser: 3.88 mg/dL — ABNORMAL HIGH (ref 0.44–1.00)
GFR, Estimated: 13 mL/min — ABNORMAL LOW (ref >60)
Glucose, Bld: 178 mg/dL — ABNORMAL HIGH (ref 70–99)
Potassium: 3 mmol/L — ABNORMAL LOW (ref 3.5–5.1)
Sodium: 138 mmol/L (ref 135–145)

## 2020-08-27 LAB — RENAL FUNCTION PANEL
Albumin: 3.1 g/dL — ABNORMAL LOW (ref 3.5–5.0)
Anion gap: 18 — ABNORMAL HIGH (ref 5–15)
BUN: 90 mg/dL — ABNORMAL HIGH (ref 6–20)
CO2: 21 mmol/L — ABNORMAL LOW (ref 22–32)
Calcium: 6.2 mg/dL — CL (ref 8.9–10.3)
Chloride: 103 mmol/L (ref 98–111)
Creatinine, Ser: 3.95 mg/dL — ABNORMAL HIGH (ref 0.44–1.00)
GFR, Estimated: 13 mL/min — ABNORMAL LOW (ref >60)
Glucose, Bld: 179 mg/dL — ABNORMAL HIGH (ref 70–99)
Phosphorus: 5.2 mg/dL — ABNORMAL HIGH (ref 2.5–4.6)
Potassium: 3.9 mmol/L (ref 3.5–5.1)
Sodium: 142 mmol/L (ref 135–145)

## 2020-08-27 LAB — GLUCOSE, CAPILLARY
Glucose-Capillary: 176 mg/dL — ABNORMAL HIGH (ref 70–99)
Glucose-Capillary: 169 mg/dL — ABNORMAL HIGH (ref 70–99)
Glucose-Capillary: 148 mg/dL — ABNORMAL HIGH (ref 70–99)
Glucose-Capillary: 194 mg/dL — ABNORMAL HIGH (ref 70–99)
Glucose-Capillary: 144 mg/dL — ABNORMAL HIGH (ref 70–99)

## 2020-08-27 LAB — MAGNESIUM
Magnesium: 1.6 mg/dL — ABNORMAL LOW (ref 1.7–2.4)
Magnesium: 1.6 mg/dL — ABNORMAL LOW (ref 1.7–2.4)

## 2020-08-27 LAB — LACTIC ACID, PLASMA: Lactic Acid, Venous: 1.5 mmol/L (ref 0.5–1.9)

## 2020-08-27 LAB — D-DIMER, QUANTITATIVE: D-Dimer, Quant: 3.29 ug/mL-FEU — ABNORMAL HIGH (ref 0.00–0.50)

## 2020-08-27 LAB — TROPONIN I (HIGH SENSITIVITY): Troponin I (High Sensitivity): 237 ng/L (ref <18)

## 2020-08-27 LAB — C-REACTIVE PROTEIN: CRP: 0.6 mg/dL (ref <1.0)

## 2020-08-27 LAB — PHOSPHORUS: Phosphorus: 5.1 mg/dL — ABNORMAL HIGH (ref 2.5–4.6)

## 2020-08-27 LAB — FERRITIN: Ferritin: 544 ng/mL — ABNORMAL HIGH (ref 11–307)

## 2020-08-27 MED ORDER — FENTANYL 2500MCG IN NS 250ML (10MCG/ML) PREMIX INFUSION
50.0000 ug/h | INTRAVENOUS | Status: DC
  Administered 2020-08-27: 50 ug/h via INTRAVENOUS
  Filled 2020-08-27: qty 250

## 2020-08-27 MED ORDER — FUROSEMIDE 10 MG/ML IJ SOLN
40.0000 mg | Freq: Once | INTRAMUSCULAR | Status: AC
  Administered 2020-08-27: 40 mg via INTRAVENOUS

## 2020-08-27 MED ORDER — ROCURONIUM BROMIDE 10 MG/ML (PF) SYRINGE
PREFILLED_SYRINGE | INTRAVENOUS | Status: AC
  Administered 2020-08-27: 150 mg
  Filled 2020-08-27: qty 10

## 2020-08-27 MED ORDER — CHLORHEXIDINE GLUCONATE CLOTH 2 % EX PADS
6.0000 | MEDICATED_PAD | Freq: Every day | CUTANEOUS | Status: DC
  Administered 2020-08-27 – 2020-08-29 (×2): 6 via TOPICAL

## 2020-08-27 MED ORDER — CALCIUM GLUCONATE-NACL 2-0.675 GM/100ML-% IV SOLN
2.0000 g | Freq: Once | INTRAVENOUS | Status: AC
  Administered 2020-08-27: 2000 mg via INTRAVENOUS
  Filled 2020-08-27: qty 100

## 2020-08-27 MED ORDER — HEPARIN (PORCINE) 25000 UT/250ML-% IV SOLN
1700.0000 [IU]/h | INTRAVENOUS | Status: DC
  Administered 2020-08-27: 1700 [IU]/h via INTRAVENOUS
  Filled 2020-08-27 (×2): qty 250

## 2020-08-27 MED ORDER — LORAZEPAM 2 MG/ML IJ SOLN: 1.0000 mg | Freq: Once | INTRAMUSCULAR | Status: DC

## 2020-08-27 MED ORDER — FUROSEMIDE 10 MG/ML IJ SOLN
INTRAMUSCULAR | Status: AC
  Filled 2020-08-27: qty 4

## 2020-08-27 MED ORDER — SODIUM CHLORIDE 0.9% FLUSH: 10.0000 mL | INTRAVENOUS | Status: DC | PRN

## 2020-08-27 MED ORDER — IPRATROPIUM-ALBUTEROL 0.5-2.5 (3) MG/3ML IN SOLN
3.0000 mL | Freq: Four times a day (QID) | RESPIRATORY_TRACT | Status: DC
  Administered 2020-08-28 – 2020-08-29 (×5): 3 mL via RESPIRATORY_TRACT
  Filled 2020-08-27 (×5): qty 3

## 2020-08-27 MED ORDER — TACROLIMUS 1 MG/ML ORAL SUSPENSION
5.0000 mg | Freq: Two times a day (BID) | ORAL | Status: DC
  Administered 2020-08-28 – 2020-09-01 (×10): 5 mg
  Filled 2020-08-27 (×11): qty 5

## 2020-08-27 MED ORDER — ETOMIDATE 2 MG/ML IV SOLN
INTRAVENOUS | Status: AC
  Administered 2020-08-27: 10 mg
  Filled 2020-08-27: qty 10

## 2020-08-27 MED ORDER — DEXAMETHASONE 6 MG PO TABS
6.0000 mg | ORAL_TABLET | ORAL | Status: AC
  Administered 2020-08-28 – 2020-08-31 (×4): 6 mg
  Filled 2020-08-27 (×8): qty 1

## 2020-08-27 MED ORDER — ETOMIDATE 2 MG/ML IV SOLN: 30.0000 mg | Freq: Once | INTRAVENOUS | Status: AC

## 2020-08-27 MED ORDER — ETOMIDATE 2 MG/ML IV SOLN
INTRAVENOUS | Status: AC
  Administered 2020-08-27: 5 mg
  Filled 2020-08-27: qty 10

## 2020-08-27 MED ORDER — CHLORHEXIDINE GLUCONATE CLOTH 2 % EX PADS
6.0000 | MEDICATED_PAD | Freq: Every day | CUTANEOUS | Status: DC
  Administered 2020-08-27 – 2020-08-30 (×4): 6 via TOPICAL

## 2020-08-27 MED ORDER — MAGNESIUM SULFATE 2 GM/50ML IV SOLN
2.0000 g | Freq: Once | INTRAVENOUS | Status: AC
  Administered 2020-08-27: 2 g via INTRAVENOUS
  Filled 2020-08-27: qty 50

## 2020-08-27 MED ORDER — FENTANYL CITRATE (PF) 100 MCG/2ML IJ SOLN: 50.0000 ug | Freq: Once | INTRAMUSCULAR | Status: DC

## 2020-08-27 MED ORDER — PROPOFOL 1000 MG/100ML IV EMUL
5.0000 ug/kg/min | INTRAVENOUS | Status: DC
  Administered 2020-08-27: 20 ug/kg/min via INTRAVENOUS
  Administered 2020-08-28 (×2): 40 ug/kg/min via INTRAVENOUS
  Administered 2020-08-28: 30 ug/kg/min via INTRAVENOUS
  Administered 2020-08-28 (×2): 40 ug/kg/min via INTRAVENOUS
  Administered 2020-08-28: 30 ug/kg/min via INTRAVENOUS
  Administered 2020-08-29: 40 ug/kg/min via INTRAVENOUS
  Administered 2020-08-29 (×3): 30 ug/kg/min via INTRAVENOUS
  Administered 2020-08-29 – 2020-08-30 (×3): 40 ug/kg/min via INTRAVENOUS
  Administered 2020-08-30: 70 ug/kg/min via INTRAVENOUS
  Administered 2020-08-30: 30 ug/kg/min via INTRAVENOUS
  Administered 2020-08-30 (×2): 50 ug/kg/min via INTRAVENOUS
  Administered 2020-08-30: 30 ug/kg/min via INTRAVENOUS
  Administered 2020-08-31 (×2): 60 ug/kg/min via INTRAVENOUS
  Administered 2020-08-31: 35 ug/kg/min via INTRAVENOUS
  Administered 2020-08-31: 65 ug/kg/min via INTRAVENOUS
  Administered 2020-08-31: 70 ug/kg/min via INTRAVENOUS
  Administered 2020-08-31: 35 ug/kg/min via INTRAVENOUS
  Administered 2020-08-31: 30 ug/kg/min via INTRAVENOUS
  Administered 2020-08-31: 40 ug/kg/min via INTRAVENOUS
  Administered 2020-09-01: 75 ug/kg/min via INTRAVENOUS
  Administered 2020-09-01 (×2): 65 ug/kg/min via INTRAVENOUS
  Administered 2020-09-01: 35 ug/kg/min via INTRAVENOUS
  Filled 2020-08-27 (×32): qty 100

## 2020-08-27 MED ORDER — MIDAZOLAM HCL 2 MG/2ML IJ SOLN
INTRAMUSCULAR | Status: AC
  Administered 2020-08-27: 1 mg
  Filled 2020-08-27: qty 4

## 2020-08-27 MED ORDER — VITAMIN B-12 1000 MCG PO TABS
1000.0000 ug | ORAL_TABLET | Freq: Every day | ORAL | Status: DC
  Administered 2020-08-28 – 2020-09-01 (×5): 1000 ug
  Filled 2020-08-27 (×5): qty 1

## 2020-08-27 MED ORDER — FENTANYL BOLUS VIA INFUSION
50.0000 ug | INTRAVENOUS | Status: DC | PRN
  Administered 2020-08-28: 50 ug via INTRAVENOUS
  Filled 2020-08-27: qty 50

## 2020-08-27 MED ORDER — CALCIUM CARBONATE-VITAMIN D 500-200 MG-UNIT PO TABS
1.0000 | ORAL_TABLET | Freq: Three times a day (TID) | ORAL | Status: DC
  Administered 2020-08-27 – 2020-09-01 (×14): 1
  Filled 2020-08-27 (×18): qty 1

## 2020-08-27 MED ORDER — AMIODARONE HCL 200 MG PO TABS
200.0000 mg | ORAL_TABLET | Freq: Every day | ORAL | Status: DC
  Administered 2020-08-28 – 2020-08-31 (×4): 200 mg
  Filled 2020-08-27 (×5): qty 1

## 2020-08-27 MED ORDER — DOCUSATE SODIUM 50 MG/5ML PO LIQD
100.0000 mg | Freq: Two times a day (BID) | ORAL | Status: DC
  Administered 2020-08-28 – 2020-08-31 (×7): 100 mg
  Filled 2020-08-27 (×7): qty 10

## 2020-08-27 MED ORDER — ORAL CARE MOUTH RINSE
15.0000 mL | OROMUCOSAL | Status: DC
  Administered 2020-08-27 – 2020-09-01 (×49): 15 mL via OROMUCOSAL

## 2020-08-27 MED ORDER — CHLORHEXIDINE GLUCONATE 0.12% ORAL RINSE (MEDLINE KIT)
15.0000 mL | Freq: Two times a day (BID) | OROMUCOSAL | Status: DC
  Administered 2020-08-27 – 2020-09-01 (×9): 15 mL via OROMUCOSAL

## 2020-08-27 MED ORDER — SODIUM CHLORIDE 0.9% FLUSH
10.0000 mL | Freq: Two times a day (BID) | INTRAVENOUS | Status: DC
  Administered 2020-08-27 – 2020-08-31 (×8): 10 mL

## 2020-08-27 MED ORDER — POLYETHYLENE GLYCOL 3350 17 G PO PACK: 17.0000 g | PACK | Freq: Every day | ORAL | Status: DC

## 2020-08-27 MED ORDER — ROCURONIUM BROMIDE 10 MG/ML (PF) SYRINGE: 150.0000 mg | PREFILLED_SYRINGE | Freq: Once | INTRAVENOUS | Status: AC

## 2020-08-27 MED ORDER — ALBUTEROL SULFATE HFA 108 (90 BASE) MCG/ACT IN AERS: 2.0000 | INHALATION_SPRAY | Freq: Four times a day (QID) | RESPIRATORY_TRACT | Status: DC | PRN

## 2020-08-27 MED ORDER — ATORVASTATIN CALCIUM 40 MG PO TABS
40.0000 mg | ORAL_TABLET | Freq: Every day | ORAL | Status: DC
  Administered 2020-08-28 – 2020-09-01 (×5): 40 mg
  Filled 2020-08-27 (×5): qty 1

## 2020-08-27 MED ORDER — ROCURONIUM BROMIDE 10 MG/ML (PF) SYRINGE
PREFILLED_SYRINGE | INTRAVENOUS | Status: AC
  Administered 2020-08-27: 150 mg via INTRAVENOUS
  Filled 2020-08-27: qty 10

## 2020-08-27 MED ORDER — PANTOPRAZOLE SODIUM 40 MG PO PACK
40.0000 mg | PACK | Freq: Every day | ORAL | Status: DC
  Administered 2020-08-28 – 2020-09-01 (×5): 40 mg
  Filled 2020-08-27 (×5): qty 20

## 2020-08-27 MED ORDER — ALBUTEROL SULFATE HFA 108 (90 BASE) MCG/ACT IN AERS
2.0000 | INHALATION_SPRAY | Freq: Two times a day (BID) | RESPIRATORY_TRACT | Status: DC
  Administered 2020-08-27: 08:00:00 2 via RESPIRATORY_TRACT

## 2020-08-27 MED ORDER — LORAZEPAM 2 MG/ML IJ SOLN
INTRAMUSCULAR | Status: AC
  Administered 2020-08-27: 1 mg
  Filled 2020-08-27: qty 1

## 2020-08-27 MED ORDER — HEPARIN (PORCINE) 25000 UT/250ML-% IV SOLN: 10.0000 [IU]/kg/h | INTRAVENOUS | Status: DC

## 2020-08-27 MED ORDER — SENNA 8.6 MG PO TABS: 1.0000 | ORAL_TABLET | Freq: Every day | ORAL | Status: DC | PRN

## 2020-08-27 MED ORDER — POLYETHYLENE GLYCOL 3350 17 G PO PACK
17.0000 g | PACK | Freq: Every day | ORAL | Status: DC
  Administered 2020-08-29 – 2020-08-31 (×3): 17 g
  Filled 2020-08-27 (×3): qty 1

## 2020-08-27 MED ORDER — FENTANYL CITRATE (PF) 100 MCG/2ML IJ SOLN
INTRAMUSCULAR | Status: AC
  Administered 2020-08-27: 100 ug
  Filled 2020-08-27: qty 2

## 2020-08-27 MED ORDER — INSULIN ASPART 100 UNIT/ML ~~LOC~~ SOLN
0.0000 [IU] | SUBCUTANEOUS | Status: DC
  Administered 2020-08-27: 2 [IU] via SUBCUTANEOUS
  Administered 2020-08-28 – 2020-08-29 (×3): 1 [IU] via SUBCUTANEOUS
  Administered 2020-08-29: 2 [IU] via SUBCUTANEOUS
  Administered 2020-08-29: 1 [IU] via SUBCUTANEOUS
  Administered 2020-08-29 (×2): 2 [IU] via SUBCUTANEOUS
  Administered 2020-08-29 (×2): 1 [IU] via SUBCUTANEOUS
  Administered 2020-08-30 (×2): 2 [IU] via SUBCUTANEOUS
  Administered 2020-08-30 – 2020-08-31 (×5): 3 [IU] via SUBCUTANEOUS
  Administered 2020-08-31 – 2020-09-01 (×5): 2 [IU] via SUBCUTANEOUS

## 2020-08-27 NOTE — Progress Notes (Incomplete)
Arrival to unit 1920.  1 mg Versed 1929 30 etomidate, 150 roc 1933 color change- Vent

## 2020-08-27 NOTE — H&P (Signed)
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  Had bone marrow biopsy 2/18.  Had rapid deterioration on the floor today, 2/20, where she had increasing oxygen requirements, and severe agitation (was hypertensive) requiring transfer to ICU.   History of Present Illness:  Patient was initially admitted for pancytopenia on 2/15 with a hemoglobin of 6.1, hypocalcemia, in the setting of COVID-19.  For her pancytopenia, she had a bone marrow biopsy on 2/18, but this has been thought to be related to chronic disease. She has been diuresed, with fluctuating creatinine (nephro on board) but good urine output.  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   She is also getting decadron $RemoveBeforeDE'6mg'[id number]$  for COVID-19 pneumonia.  For her hypocalcemia, due to her CKD, she is getting vitamin D repletion, calcitriol, and as needed IV calcium replacement   She was initially on 4L of O2 for her stay until this morning, when she had increasing O2 requirements and agitated delirium.  Also noted to be hypertensive during this episode and had an episode of hemoptysis along with increasing O2 requirements.    She was very agitated on arrival to ICU on 2/20, was on nonrebreather mask, and could not get an accurate pulse ox.  ABG showed respiratory acidosis (pre-intubation).  Patient was urgently intubated due to respiratory failure.     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 CHF (EF 20-25%) RUE DVT DM HTN  Significant Hospital Events:  Bone marrow  biopsy 2/18 Intubation for acute hypoxic and hypercarbic respiratory failure 2/20  Consults:  Nephrology  Procedures:  BM biopsy 2/18 Intubation 2/20 L IJ CVC placed 2/20- in subclavian vein  Significant Diagnostic Tests:  CXR 2/20: increased bilateral alveolar infiltrates  Micro Data:  2/15 SARS Cov2 POSITIVE 2/15 MRSA screen NEGATIVE 2/20 blood cultures >> 2/20 respiratory cultures>>  Antimicrobials:     Interim History / Subjective:    Objective   Blood pressure (!) 209/100, pulse 98, temperature 98 F (36.7 C), temperature source Oral, resp. rate (!) 22, weight 122 kg, last menstrual period 11/05/2012, SpO2 100 %.    Vent Mode: PRVC FiO2 (%):  [50 %-100 %] 50 % Set Rate:  [22 bmp] 22 bmp Vt Set:  [560 mL] 560 mL PEEP:  [5 cmH20] 5 cmH20 Plateau Pressure:  [18 cmH20] 18 cmH20   Intake/Output Summary (Last 24 hours) at 08/27/2020 2047 Last data filed at 08/27/2020 1731 Gross per 24 hour  Intake 490 ml  Output 2050 ml  Net -1560 ml   Filed Weights   08/25/20 0520 08/26/20 0524 08/27/20 0643  Weight: 120.3 kg 121.6 kg 122 kg    Examination: General:  Pre-intubation patient was very agitated. Unable to cooperate with care. 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  Had acute decompensation on 2/20 with increasing O2 requirements, agitation, and acute hypoxic and hypercarbic respiratory failure requiring intubation.     # Acute hypoxic and hypercarbic respiratory failure # COVID-19 # CHF exacerbation intubated 2/20, CXR with worsening infiltrates.  I suspect that she likely had acute flash pulmonary edema with underlying CHF- she it appears to have bilateral alveolar infitlrates and she was hypertensive on the floor.  She also reported had evidence of hemoptysis in the setting of anticoagulation.  Worsening of COVID is also in differential.   - possible fat embolism from bone marrow biopsy yesterday.  Will further consider this if does not improve with diuresis. - also PE is of consideration, her AC had been pasued for the BM biopsy, but otherwise she has been on anticoagulation.  Will hold on a CTA PE for now in light of her poor kidney function, but will consider if does not improve with diuresis. Plan: - ventilation with goal of 6cc/kg - diuresis with goal at least 1-2 L net negative - respiratory and blood cultures.  No evidence of new infection yet, so will hold empiric abx for now - continue decadron $RemoveBeforeDEI'6mg'[id number]$  daily for COVID-19 treatment - troponin and EKG - stop eliquis--> heparin gtt  # Acute encephalopathy: Agitated delirium now resolved s/p intubation and sedation.  Agitation likely precipitated by worsening hypoxia on the floor.  - daily spontaneous awaking trials  # Pancytopenia: s/p bone marrow biopsy 2/18.  Likely from chronic illness, and advanced renal disease - follow up BM bx results - trend CBC - no need for trasnfusion at this time  # CKD stage V # Hypocalcemia Currently has good UOP with diuresis. Has been given lasix $RemoveBef'40mg'SbFjxukJlt$  today. Nephrology on board - follow up UOP today, give additional lasix if need to get net negative - appreciate nephrology recs - BMP now  # Hypertension: currenly on bidil, amiodarone, and toprol XL - holding metoprolol while getting sedation.  Can restart if BP tolerates - continue bidil and amiodarone for now  # pAFib: in sinus now - continue  amiodarone  # AT III deficiency # RUE DVT - continue anticoagulation.  Switch from eliquis to heparin  Best practice (evaluated daily)  Diet: NPO Pain/Anxiety/Delirium protocol (if indicated): yes VAP protocol (if indicated): yes DVT prophylaxis: heparin gtt GI prophylaxis: ppi  Glucose control: SSI q4H Mobility: n/a Disposition:ICU  Goals of Care:  Last date of multidisciplinary goals of care discussion: family aware of transfer to ICU Family and staff present: no Summary of discussion: Follow up goals of care discussion due:  Code Status: Full  Labs   CBC: Recent Labs  Lab 08/18/2020 0915 08/12/2020 2040 08/23/20 0250 08/24/20 0121 08/24/20 1838 08/25/20 1158 08/26/20 0245 08/27/20 0608 08/27/20 2018  WBC 2.3*  --  2.4* 2.1* 2.9* 2.8* 3.4* 3.3*  --   NEUTROABS 1.9  --  2.0  --   --  2.2  --   --   --   HGB 6.1*   < > 7.8* 7.5* 7.8* 7.8* 7.3* 7.8* 7.5*  HCT 19.4*   < > 23.7* 24.2* 25.5* 24.8* 22.2* 24.6* 22.0*  MCV 95.6  --  92.6 94.2 94.8 94.7 92.9 94.3  --   PLT 81*  --  89* 98*  101* 109* 106* 134*  --    < > = values in this interval not displayed.    Basic Metabolic Panel: Recent Labs  Lab 08/23/20 0250 08/24/20 0121 08/25/20 0150 08/26/20 0245 08/27/20 0223 08/27/20 0608 08/27/20 2018  NA 142 142 142 141  --  142 142  K 3.9 4.1 4.2 4.0  --  3.9 3.2*  CL 106 107 107 105  --  103  --   CO2 21* 21* 21* 23  --  21*  --   GLUCOSE 162* 189* 224* 203*  --  179*  --   BUN 84* 89* 90* 87*  --  90*  --   CREATININE 3.94* 4.01* 4.06* 3.84*  --  3.95*  --   CALCIUM 5.8* 5.5* 5.6* 5.7*  --  6.2*  --   MG 1.5* 1.6* 1.8 1.6* 1.6*  --   --   PHOS 5.5* 5.7* 5.2* 5.1* 5.1* 5.2*  --    GFR: Estimated Creatinine Clearance: 22.9 mL/min (A) (by C-G formula based on SCr of 3.95 mg/dL (H)). Recent Labs  Lab 08/24/20 1838 08/25/20 1158 08/26/20 0245 08/27/20 0608  WBC 2.9* 2.8* 3.4* 3.3*    Liver Function Tests: Recent Labs  Lab 08/13/2020 0915 08/23/20 0250  08/24/20 0121 08/25/20 0150 08/27/20 0608  AST $Re'15 15 16 22  'bqq$ --   ALT $Re'14 13 13 16  'mBg$ --   ALKPHOS 468* 489* 398* 403*  --   BILITOT 0.2* 0.5 0.5 0.5  --   PROT 5.4* 5.5* 4.8* 5.3*  --   ALBUMIN 3.1* 3.1* 2.8* 3.0* 3.1*   No results for input(s): LIPASE, AMYLASE in the last 168 hours. No results for input(s): AMMONIA in the last 168 hours.  ABG    Component Value Date/Time   PHART 7.397 08/27/2020 2018   PCO2ART 42.4 08/27/2020 2018   PO2ART 354 (H) 08/27/2020 2018   HCO3 26.0 08/27/2020 2018   TCO2 27 08/27/2020 2018   ACIDBASEDEF 5.2 (H) 08/27/2020 1857   O2SAT 100.0 08/27/2020 2018     Coagulation Profile: Recent Labs  Lab 08/24/20 0121  INR 1.3*    Cardiac Enzymes: No results for input(s): CKTOTAL, CKMB, CKMBINDEX, TROPONINI in the last 168 hours.  HbA1C: Hemoglobin A1C  Date/Time Value Ref Range Status  12/02/2017 12:00 AM 4.8  Final  08/04/2017 12:00 AM 5.3  Final   Hgb A1c MFr Bld  Date/Time Value Ref Range Status  07/05/2020 05:32 AM 5.6 4.8 - 5.6 % Final    Comment:    (NOTE) Pre diabetes:          5.7%-6.4%  Diabetes:              >6.4%  Glycemic control for   <7.0% adults with diabetes   03/14/2020 10:15 PM 5.0 4.8 - 5.6 % Final    Comment:    (NOTE) Pre diabetes:          5.7%-6.4%  Diabetes:              >6.4%  Glycemic control for   <7.0% adults with diabetes     CBG: Recent Labs  Lab 08/26/20 2112 08/27/20 0731 08/27/20 1204 08/27/20 1729 08/27/20 2011  GLUCAP 202* 176* 169* 148* 194*    Review of Systems:   Not able to obtain due to critical status  Past Medical History:  She,  has a past medical history of Acute heart failure ([place]) (06/2020), Blood transfusion without reported diagnosis, CHF (congestive  heart failure) ([place]), Diabetes mellitus (November 03, 2011), GERD (gastroesophageal reflux disease), Hypertension, Renal failure, and S/p cadaver renal transplant (October 2011).   Surgical History:   Past Surgical History:   Procedure Laterality Date  . [place]  . CHOLECYSTECTOMY  2008  . COLONOSCOPY  10 years ago   Dr.Hung="normal"  . COLONOSCOPY WITH PROPOFOL N/A 08/05/2020   Procedure: COLONOSCOPY WITH PROPOFOL;  Surgeon: Carol Ada, MD;  Location: [facility];  Service: Endoscopy;  Laterality: N/A;  . DG AV DIALYSIS  SHUNT ACCESS EXIST*L* OR    . ESOPHAGOGASTRODUODENOSCOPY (EGD) WITH PROPOFOL N/A 08/05/2020   Procedure: ESOPHAGOGASTRODUODENOSCOPY (EGD) WITH PROPOFOL;  Surgeon: Carol Ada, MD;  Location: [facility];  Service: Endoscopy;  Laterality: N/A;  . KIDNEY TRANSPLANT  2011  . UMBILICAL HERNIA REPAIR  2009     Social History:   reports that she has quit smoking. Her smoking use included cigarettes. She smoked 0.50 packs per day. She has never used smokeless tobacco. She reports that she does not drink alcohol and does not use drugs.   Family History:  Her family history includes Arthritis in her father; Atrial fibrillation in her father; Diabetes in her brother and mother; Heart failure in her sister; Hypertension in her brother, father, mother, and sister. There is no history of Colon cancer or Stomach cancer.   Allergies Allergies  Allergen Reactions  . Infed [Iron Dextran] Other (See Comments)    Pt has chest pain  Chest pain  . Cefazolin Itching and Nausea And Vomiting  . Ancef [Cefazolin Sodium] Other (See Comments)    Tolerated rocephin 11/03/10 Reports ancef causes itching, sweating, vomiting (x2-2009?)  . Lisinopril Swelling  . Other     Pt may be allergic to another antibiotic daughter isn't sure and pt unable to verify.     Home Medications  Prior to Admission medications   Medication Sig Start Date End Date Taking? Authorizing Provider  acetaminophen (TYLENOL) 325 MG tablet Take 2 tablets (650 mg total) by mouth every 6 (six) hours as needed for mild pain (or Fever >/= 101). 03/24/20  Yes Marianna Payment, MD  albuterol (PROVENTIL) (2.5 MG/3ML) 0.083%  nebulizer solution Take 3 mLs (2.5 mg total) by nebulization every 6 (six) hours as needed for wheezing or shortness of breath. Patient taking differently: Take 2.5 mg by nebulization every 6 (six) hours as needed for shortness of breath. 03/24/20  Yes Marianna Payment, MD  amiodarone (PACERONE) 200 MG tablet Take 1 tablet (200 mg total) by mouth daily. 07/21/20 08/20/20 Yes Rehman, Areeg N, DO  apixaban (ELIQUIS) 5 MG TABS tablet Take 1 tablet (5 mg total) by mouth 2 (two) times daily. 04/26/20 10/01/20 Yes Angelica Pou, MD  atorvastatin (LIPITOR) 40 MG tablet Take 1 tablet (40 mg total) by mouth daily. Patient taking differently: Take 40 mg by mouth at bedtime. 03/25/20  Yes Marianna Payment, MD  calcitRIOL (ROCALTROL) 0.5 MCG capsule Take 1 capsule (0.5 mcg total) by mouth 2 (two) times daily. 08/06/20  Yes [name], [name], DO  calcium-vitamin D (OSCAL WITH D) 500-200 MG-UNIT tablet Take 1 tablet by mouth 3 (three) times daily. 08/06/20  Yes [name], [name], DO  DULoxetine (CYMBALTA) 20 MG capsule Take one 20 mg tablet for one week, then take 1/2 tablet for two weeks, then stop medication Patient taking differently: Take 10 mg by mouth daily. 08/06/20  Yes [name], [name], DO  ferrous sulfate 325 (65 FE) MG tablet Take 1 tablet (  325 mg total) by mouth 2 (two) times daily with a meal. 03/24/20  Yes Marianna Payment, MD  guaiFENesin-dextromethorphan (ROBITUSSIN DM) 100-10 MG/5ML syrup Take 5 mLs by mouth every 4 (four) hours as needed for cough. 03/24/20  Yes Marianna Payment, MD  insulin aspart (NOVOLOG) 100 UNIT/ML injection Inject 0-20 Units into the skin 3 (three) times daily with meals. 03/24/20  Yes Marianna Payment, MD  isosorbide-hydrALAZINE (BIDIL) 20-37.5 MG tablet Take 1.5 tablets by mouth 3 (three) times daily. Patient taking differently: Take 1 tablet by mouth 3 (three) times daily. 08/06/20  Yes [name], [name], DO  loperamide (IMODIUM) 2 MG capsule Take 2 capsules (4 mg total) by mouth daily as  needed for diarrhea or loose stools. 07/20/20  Yes Rehman, Areeg N, DO  loratadine (CLARITIN) 10 MG tablet Take 1 tablet (10 mg total) by mouth daily. Patient taking differently: Take 10 mg by mouth daily as needed for allergies. 03/25/20  Yes Marianna Payment, MD  metoprolol succinate (TOPROL-XL) 25 MG 24 hr tablet Take 1 tablet (25 mg total) by mouth daily. 03/25/20  Yes Marianna Payment, MD  mycophenolate (MYFORTIC) 180 MG EC tablet TAKE 3 TABLETS BY MOUTH TWICE DAILY Patient taking differently: Take 540 mg by mouth 2 (two) times daily. 03/31/20  Yes Maudie Mercury, MD  omeprazole (PRILOSEC) 40 MG capsule Take 40 mg by mouth daily. 05/22/20  Yes [provider]  predniSONE (DELTASONE) 5 MG tablet Take 1 tablet (5 mg total) by mouth daily with breakfast. 07/21/20  Yes Rehman, Areeg N, DO  sevelamer carbonate (RENVELA) 800 MG tablet Take 1 tablet (800 mg total) by mouth 3 (three) times daily with meals. 08/06/20  Yes [name], [name], DO  tacrolimus (PROGRAF) 5 MG capsule Take 1 capsule (5 mg total) by mouth 2 (two) times daily. 07/20/20  Yes Rehman, Areeg N, DO  torsemide (DEMADEX) 20 MG tablet Take 4 tablets (80 mg total) by mouth daily. 07/21/20  Yes Rehman, Areeg N, DO  triamcinolone ointment (KENALOG) 0.1 % Apply 1 application topically 2 (two) times daily as needed (rash). 02/24/20  Yes [provider]  vitamin B-12 (CYANOCOBALAMIN) 1000 MCG tablet Take 1 tablet (1,000 mcg total) by mouth daily. 10/11/19  Yes Ladona Horns, MD  Darbepoetin Alfa (ARANESP) 60 MCG/0.3ML SOSY injection Inject 0.3 mLs (60 mcg total) into the skin every Saturday at 6 PM. 07/22/20   Rehman, Areeg N, DO     Critical care time: 52     This patient is critically ill with acute hypoxic respiratory failure; which, requires frequent high complexity decision making, assessment, support, evaluation, and titration of therapies. This was completed through the application of advanced monitoring technologies and extensive  interpretation of multiple databases. During this encounter critical care time was devoted to patient care services described in this note for 75 minutes.

## 2020-08-27 NOTE — Progress Notes (Addendum)
Subjective: HD 5 Overnight, no acute events reported.  This morning, [name] was evaluated at bedside. She reports feeling okay this morning. States that she is breathing fine and cough is improved. She has not been able to get out of bed yet. She expresses frustration with having to continue over at rehab with physical therapy due to recurrent hospitalizations.   Objective:  Vital signs in last 24 hours: Vitals:   08/26/20 1451 08/26/20 2112 08/27/20 0600 08/27/20 0643  BP: (!) 173/68 (!) 150/64 (!) 174/82   Pulse: 62 61 62   Resp: 18 17 17    Temp: 98.3 F (36.8 C) 98.5 F (36.9 C) 97.8 F (36.6 C)   TempSrc: Oral Oral Oral   SpO2: 96% 100% 98%   Weight:    122 kg   CBC Latest Ref Rng & Units 08/27/2020 08/26/2020 08/25/2020  WBC 4.0 - 10.5 K/uL 3.3(L) 3.4(L) 2.8(L)  Hemoglobin 12.0 - 15.0 g/dL 7.8(L) 7.3(L) 7.8(L)  Hematocrit 36.0 - 46.0 % 24.6(L) 22.2(L) 24.8(L)  Platelets 150 - 400 K/uL 134(L) 106(L) 109(L)   BMP Latest Ref Rng & Units 08/26/2020 08/25/2020 08/24/2020  Glucose 70 - 99 mg/dL 203(H) 224(H) 189(H)  BUN 6 - 20 mg/dL 87(H) 90(H) 89(H)  Creatinine 0.44 - 1.00 mg/dL 3.84(H) 4.06(H) 4.01(H)  BUN/Creat Ratio 9 - 23 - - -  Sodium 135 - 145 mmol/L 141 142 142  Potassium 3.5 - 5.1 mmol/L 4.0 4.2 4.1  Chloride 98 - 111 mmol/L 105 107 107  CO2 22 - 32 mmol/L 23 21(L) 21(L)  Calcium 8.9 - 10.3 mg/dL 5.7(LL) 5.6(LL) 5.5(LL)   Physical Exam  Constitutional: Appears well-developed and well-nourished. No distress.  HENT: Normocephalic and atraumatic, EOMI, conjunctiva normal, moist mucous membranes Cardiovascular: Normal rate, regular rhythm, S1 and S2 present, no murmurs, rubs, gallops.  Distal pulses intact Respiratory: No respiratory distress, no accessory muscle use. Diffuse expiratory wheezing on exam, on 4L O2 GI: Nondistended, soft, nontender to palpation, active bowel sounds Musculoskeletal: Normal bulk and tone.  No peripheral edema noted. Neurological:  Is alert and oriented x4, no apparent focal deficits noted. Skin: Warm and dry.  No rash, erythema, lesions noted.  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  Acute hypoxic respiratory failure 2/2 COVID pneumonia Diagnosed 2/13; patient remains stable at 4L O2 via [place] (baseline ~3L). Persistent mild elevation of ferritin. She endorses improvement in her respiratory status. - Continue supplemental oxygen prn, wean to baseline as tolerated - Continue dexamethasone 6mg  daily, day 6/10 - Albuterol inhaler prn - Encourage ICS/flutter valve - Trend inflammatory markers - Albuterol prn  Recurrent acute on chronic anemia Pancytopenia This is stable this morning; slight improvement in leukopenia; anemia and thrombocytopenia remain stable. Patient underwent bone marrow biopsy on 2/18; results pending. Suspect her pancytopenia may be secondary to her chronic advanced renal disease with immunosuppression in setting of renal transplant history with acute COVID infection. - Follow up bone marrow biopsy results - Continue to trend CBC -  Monitor for any signs of bleeding   CKD V; ESRD s/p renal transplant Hypocalcemia 2/2 secondary hyperparathyroidism Patient's sCr stable ~4; does have some improvement in urinary output; although value may be unreliable given.  Continue to have hypocalcemia.  Corrected serum calcium to 7.1 for which given IV calcium gluconate. Holding  diuresis for today -Nephrology consulted, appreciate recommendations - Continue oscal tid, calcitriol bid  - IV calcium gluconate given - Cont Renvela - Cont prograf  - Holding myfortic and prednisone for now  Hypertension - Continue BiDil, amiodarone, and Toprol XL  Code status: FULL Diet: Renal Fluids: None  VTE Prophylaxis: Eliquis  Prior to Admission Living Arrangement: Home Anticipated Discharge Location: SNF Barriers to Discharge: SNF placement, continued medical management  Dispo: Anticipated discharge in approximately 3-4 day(s).   Harvie Heck, MD  [facility] PGY-2 08/27/2020, 7:08 AM Pager: [phone] After 5pm on weekdays and 1pm on weekends: On Call pager [phone]

## 2020-08-27 NOTE — Evaluation (Signed)
Physical Therapy Evaluation Patient Details Name: [name] MRN: [id number] DOB: 10/06/1965 Today's Date: 08/27/2020   History of Present Illness  Pt is a 55 y.o. female dx with COVID-19 on 08/20/20, now admitted from [facility] SNF on 08/13/2020 with anemia and hypocalcemia. CXR with chronic bilateral effusions. S/p bone marrow biopsy 2/18. PMH includes CKF (s/p renal transplant 2011), HTN, CHF, DM, anemia. Of note, recent admission from home 06/2021 with d/c to SNF.  Clinical Impression  Pt presents with condition above and deficits mentioned below, see PT Problem List. Pt was most recently at a SNF receiving therapy services and was needing +2 for transfers and had not started to ambulate yet. Pt demonstrates generalized weakness, endurance deficits, and coordination deficits that impact her safety and independence with all functional mobility. Pt had a long anxiety attack following rolling 1x with minA this date. She gets anxious with new people, positions, and people being in her personal space. Did not come to sit EOB this date but pt able to obtain long-sitting with bil UE support with min guard. Will continue to follow acutely. Recommend pt return to receive PT at SNF upon d/c to maximize her independence and safety with all functional mobility.    Follow Up Recommendations SNF;Supervision/Assistance - 24 hour    Equipment Recommendations  None recommended by PT    Recommendations for Other Services       Precautions / Restrictions Precautions Precautions: Fall Precaution Comments: HOH; anxiety attacks when in personal space Restrictions Weight Bearing Restrictions: No      Mobility  Bed Mobility Overal bed mobility: Needs Assistance Bed Mobility: Rolling;Supine to Sit Rolling: Min assist   Supine to sit: HOB elevated;Min guard (for long sitting)     General bed mobility comments: Pt rolled to R 1x, needing use of R bed rail and minA at L leg. Pt with anxiety attack following  roll and pt transitions supine > long-sitting in bed with HOB elevated repeatedly with min guard. Pt refused to sit up EOB.    Transfers                 General transfer comment: pt refused  Ambulation/Gait                Stairs            Wheelchair Mobility    Modified Rankin (Stroke Patients Only)       Balance Overall balance assessment: Needs assistance Sitting-balance support: Bilateral upper extremity supported Sitting balance-Leahy Scale: Poor Sitting balance - Comments: requires UE support in long sitting                                     Pertinent Vitals/Pain Pain Assessment: 0-10 Pain Score: 6  Pain Location: Bil knees 6/10, back 4/10 Pain Descriptors / Indicators: Aching;Grimacing;Guarding Pain Intervention(s): Limited activity within patient's tolerance;Monitored during session;Repositioned    Home Living Family/patient expects to be discharged to:: Skilled nursing facility                 Additional Comments: Pt is from [facility] care    Prior Function Level of Independence: Needs assistance   Gait / Transfers Assistance Needed: Pt reports that she has been transferring to w/c with therapy and nsg staff at SNF with 2+ assisst  ADL's / Homemaking Assistance Needed: Pt reports she was able to perform grooming and self feeding  with set up assist, but requires assist with all other ADLs        Hand Dominance   Dominant Hand: Right    Extremity/Trunk Assessment   Upper Extremity Assessment Upper Extremity Assessment: Defer to OT evaluation    Lower Extremity Assessment Lower Extremity Assessment: Generalized weakness    Cervical / Trunk Assessment Cervical / Trunk Assessment: Normal  Communication   Communication: HOH  Cognition Arousal/Alertness: Awake/alert Behavior During Therapy: Anxious Overall Cognitive Status: No family/caregiver present to determine baseline cognitive functioning                                  General Comments: Pt with anxiety attack when rolling 1x in bed and it increased when therapist was in her personal space to try to hand her the bed control remote so she could control the HOB elevation. Pt requesting PT manage bed and fan her during anxiety attack lasting majority of session.      General Comments General comments (skin integrity, edema, etc.): Pt on 2L supplemental O2 via [place] start of session, increased it up to 5L/min per pt request during anxiety attack, once calm reduced to 3L/min with SpO2 remaining >/= 91% throughout    Exercises     Assessment/Plan    PT Assessment Patient needs continued PT services  PT Problem List Decreased strength;Decreased activity tolerance;Decreased range of motion;Decreased balance;Decreased mobility;Decreased coordination;Decreased safety awareness;Decreased knowledge of precautions;Cardiopulmonary status limiting activity       PT Treatment Interventions DME instruction;Gait training;Functional mobility training;Therapeutic exercise;Therapeutic activities;Balance training;Neuromuscular re-education;Patient/family education    PT Goals (Current goals can be found in the Care Plan section)  Acute Rehab PT Goals Patient Stated Goal: to be able to eventually go home PT Goal Formulation: With patient Time For Goal Achievement: 09/10/20 Potential to Achieve Goals: Fair    Frequency Min 2X/week   Barriers to discharge        Co-evaluation               AM-PAC PT "6 Clicks" Mobility  Outcome Measure Help needed turning from your back to your side while in a flat bed without using bedrails?: A Lot Help needed moving from lying on your back to sitting on the side of a flat bed without using bedrails?: A Lot Help needed moving to and from a bed to a chair (including a wheelchair)?: Total Help needed standing up from a chair using your arms (e.g., wheelchair or bedside chair)?: Total Help  needed to walk in hospital room?: Total Help needed climbing 3-5 steps with a railing? : Total 6 Click Score: 8    End of Session Equipment Utilized During Treatment: Oxygen Activity Tolerance: Other (comment) (limited by anxiety) Patient left: in bed;with call bell/phone within reach;with bed alarm set   PT Visit Diagnosis: Muscle weakness (generalized) (M62.81);Difficulty in walking, not elsewhere classified (R26.2)    Time: 6314-9702 PT Time Calculation (min) (ACUTE ONLY): 35 min   Charges:   PT Evaluation $PT Eval Moderate Complexity: 1 Mod PT Treatments $Therapeutic Activity: 8-22 mins        Moishe Spice, PT, DPT Acute Rehabilitation Services  Pager: [phone] Office: [phone]   Orvan Falconer 08/27/2020, 4:41 PM

## 2020-08-27 NOTE — Progress Notes (Signed)
Patient suddenly called out stating "I can't breathe" this RN and charge RN Rudene Anda went to the room. Patient was tachypneic and satting 88-91%. The patient's O2 was increased from 3L [place] to 15L HFNC. The patient starting coughing up bright blood-tinged sputum. MD & rapid response RN paged. Patient's O2 sats remained in low to mid 80's. NRB mask added at 15L. Sats raised to 90%.   After MD and RR arrived, patient starting become more anxious. Chest Xray taken, Lasix IV push given, Ativan given, and patient placed in soft wrist restraints due to threat to her own safety removing O2 devices. Patient lost IV access but another access was achieved shortly after.   ST elevation noticed on monitor, 12-lead EKG was obtained.  Patient has been transferred to [facility].

## 2020-08-27 NOTE — Procedures (Signed)
Central Venous Catheter Insertion Procedure Note  [name]  [id number]  24-Mar-1966  Date:08/27/20  Time:8:44 PM   Provider Performing:Fard Borunda T Francie Massing   Procedure: Insertion of Non-tunneled Central Venous Catheter(36556) with US guidance (25956)   Indication(s) Difficult access  Consent Unable to obtain consent due to emergent nature of procedure.  Anesthesia on propofol  Timeout Verified patient identification, verified procedure, site/side was marked, verified correct patient position, special equipment/implants available, medications/allergies/relevant history reviewed, required imaging and test results available.  Sterile Technique Maximal sterile technique including full sterile barrier drape, hand hygiene, sterile gown, sterile gloves, mask, hair covering, sterile ultrasound probe cover (if used).  Procedure Description Area of catheter insertion was cleaned with chlorhexidine and draped in sterile fashion.  With real-time ultrasound guidance a central venous catheter was placed into the left internal jugular vein. Nonpulsatile blood flow and easy flushing noted in all ports.  The catheter was sutured in place and sterile dressing applied.  Complications/Tolerance None; patient tolerated the procedure well. Chest X-ray is ordered to verify placement for internal jugular or subclavian cannulation.    CXR shows CVC likely in azygos vein, but able to use for medications.    EBL Minimal  Specimen(s) None

## 2020-08-27 NOTE — Progress Notes (Signed)
[facility] ROUNDING NOTE   Subjective:   Brief History:  55 year old lady with a history of renal transplant cadaveric 2011 appears like a baseline creatinine about 2.5 mg/dL based on notes from office.  However during her hospitalizations in January it looks like her creatinine has gradually been worsening.  On 08/06/2020 it did increase to 3.66 mg/dL. she has a history of hypertension and congestive heart failure with a diminished ejection fraction of about 30%.  Atrial fibrillation and thrombophilia with an antithrombin III deficiency she presented 08/15/2020 with shortness of breath.  She was diagnosed with COVID-19 pneumonia 08/20/2020.  She has been admitted for Decadron and close monitoring. she has been evaluated by hematology and plans will be for a bone marrow biopsy as an inpatient.  Most recent hospitalizations #1 07/04/2020  -2/50/0370 complications of acute on chronic heart failure with a creatinine that had increased during hospitalization. Most recent hospitalizations #2   08/01/2020 -08/06/2020 with worsening anemia.  Interval History: High amount of urine output over the past 24 hours.  Creatinine slightly higher but overall stable.  Patient continues to feel well.  Overall feels like her shortness of breath is improving.  Weight documented to be higher but this was a bad weight    Objective:  Vital signs in last 24 hours:  Temp:  [97.8 F (36.6 C)-98.5 F (36.9 C)] 97.8 F (36.6 C) (02/20 0600) Pulse Rate:  [61-62] 62 (02/20 0600) Resp:  [17-18] 17 (02/20 0600) BP: (150-174)/(64-82) 174/82 (02/20 0600) SpO2:  [96 %-100 %] 98 % (02/20 0600) Weight:  [122 kg] 122 kg (02/20 0643)  Weight change: 0.4 kg Filed Weights   08/25/20 0520 08/26/20 0524 08/27/20 0643  Weight: 120.3 kg 121.6 kg 122 kg    Intake/Output: I/O last 3 completed shifts: In: 1300 [P.O.:1200; IV Piggyback:100] Out: 3450 [Urine:3450]   Intake/Output this shift:  Total I/O In: -   Out: 1200 [Urine:1200]  Elderly nondistressed, lying in bed CVS-irregular rate and rhythm JVP elevated RS-crackles bilaterally ABD- BS present soft non-distended EXT-1+ pitting edema   Basic Metabolic Panel: Recent Labs  Lab 08/23/20 0250 08/24/20 0121 08/25/20 0150 08/26/20 0245 08/27/20 0223 08/27/20 0608  NA 142 142 142 141  --  142  K 3.9 4.1 4.2 4.0  --  3.9  CL 106 107 107 105  --  103  CO2 21* 21* 21* 23  --  21*  GLUCOSE 162* 189* 224* 203*  --  179*  BUN 84* 89* 90* 87*  --  90*  CREATININE 3.94* 4.01* 4.06* 3.84*  --  3.95*  CALCIUM 5.8* 5.5* 5.6* 5.7*  --  6.2*  MG 1.5* 1.6* 1.8 1.6* 1.6*  --   PHOS 5.5* 5.7* 5.2* 5.1* 5.1* 5.2*    Liver Function Tests: Recent Labs  Lab 08/20/2020 0915 08/23/20 0250 08/24/20 0121 08/25/20 0150 08/27/20 0608  AST '15 15 16 22  ' --   ALT '14 13 13 16  ' --   ALKPHOS 468* 489* 398* 403*  --   BILITOT 0.2* 0.5 0.5 0.5  --   PROT 5.4* 5.5* 4.8* 5.3*  --   ALBUMIN 3.1* 3.1* 2.8* 3.0* 3.1*   No results for input(s): LIPASE, AMYLASE in the last 168 hours. No results for input(s): AMMONIA in the last 168 hours.  CBC: Recent Labs  Lab 08/21/2020 0915 08/09/2020 2040 08/23/20 0250 08/24/20 0121 08/24/20 1838 08/25/20 1158 08/26/20 0245 08/27/20 0608  WBC 2.3*  --  2.4* 2.1* 2.9* 2.8* 3.4*  3.3*  NEUTROABS 1.9  --  2.0  --   --  2.2  --   --   HGB 6.1*   < > 7.8* 7.5* 7.8* 7.8* 7.3* 7.8*  HCT 19.4*   < > 23.7* 24.2* 25.5* 24.8* 22.2* 24.6*  MCV 95.6  --  92.6 94.2 94.8 94.7 92.9 94.3  PLT 81*  --  89* 98* 101* 109* 106* 134*   < > = values in this interval not displayed.    Cardiac Enzymes: No results for input(s): CKTOTAL, CKMB, CKMBINDEX, TROPONINI in the last 168 hours.  BNP: Invalid input(s): POCBNP  CBG: Recent Labs  Lab 08/26/20 0749 08/26/20 1133 08/26/20 1742 08/26/20 2112 08/27/20 0731  GLUCAP 158* 155* 152* 202* 176*    Microbiology: Results for orders placed or performed during the hospital  encounter of 08/15/2020  Resp Panel by RT-PCR (Flu A&B, Covid) Nasopharyngeal Swab     Status: Abnormal   Collection Time: 08/17/2020  9:51 AM   Specimen: Nasopharyngeal Swab; Nasopharyngeal(NP) swabs in vial transport medium  Result Value Ref Range Status   SARS Coronavirus 2 by RT PCR POSITIVE (A) NEGATIVE Final    Comment: RESULT CALLED TO, READ BACK BY AND VERIFIED WITH: RN MISSY E. [phone] (NOTE) SARS-CoV-2 target nucleic acids are DETECTED.  The SARS-CoV-2 RNA is generally detectable in upper respiratory specimens during the acute phase of infection. Positive results are indicative of the presence of the identified virus, but do not rule out bacterial infection or co-infection with other pathogens not detected by the test. Clinical correlation with patient history and other diagnostic information is necessary to determine patient infection status. The expected result is Negative.  Fact Sheet for Patients: EntrepreneurPulse.com.au  Fact Sheet for Healthcare Providers: IncredibleEmployment.be  This test is not yet approved or cleared by the [place] FDA and  has been authorized for detection and/or diagnosis of SARS-CoV-2 by FDA under an Emergency Use Authorization (EUA).  This EUA will remain in effect (meaning this test can be used) for  the duration of  the COVID-19 declaration under Section 564(b)(1) of the Act, 21 U.S.C. section 360bbb-3(b)(1), unless the authorization is terminated or revoked sooner.     Influenza A by PCR NEGATIVE NEGATIVE Final   Influenza B by PCR NEGATIVE NEGATIVE Final    Comment: (NOTE) The Xpert Xpress SARS-CoV-2/FLU/RSV plus assay is intended as an aid in the diagnosis of influenza from Nasopharyngeal swab specimens and should not be used as a sole basis for treatment. Nasal washings and aspirates are unacceptable for Xpert Xpress SARS-CoV-2/FLU/RSV testing.  Fact Sheet for  Patients: EntrepreneurPulse.com.au  Fact Sheet for Healthcare Providers: IncredibleEmployment.be  This test is not yet approved or cleared by the [place] FDA and has been authorized for detection and/or diagnosis of SARS-CoV-2 by FDA under an Emergency Use Authorization (EUA). This EUA will remain in effect (meaning this test can be used) for the duration of the COVID-19 declaration under Section 564(b)(1) of the Act, 21 U.S.C. section 360bbb-3(b)(1), unless the authorization is terminated or revoked.  Performed at [facility], [place] [address] [place], [place] [address]   MRSA PCR Screening     Status: None   Collection Time: 08/10/2020 11:16 PM   Specimen: Nasal Mucosa; Nasopharyngeal  Result Value Ref Range Status   MRSA by PCR NEGATIVE NEGATIVE Final    Comment:        The GeneXpert MRSA Assay (FDA approved for NASAL specimens only), is one component  of a comprehensive MRSA colonization surveillance program. It is not intended to diagnose MRSA infection nor to guide or monitor treatment for MRSA infections. Performed at [facility], [place] [address] [place], [place] [address]     Coagulation Studies: No results for input(s): LABPROT, INR in the last 72 hours.  Urinalysis: No results for input(s): COLORURINE, LABSPEC, PHURINE, GLUCOSEU, HGBUR, BILIRUBINUR, KETONESUR, PROTEINUR, UROBILINOGEN, NITRITE, LEUKOCYTESUR in the last 72 hours.  Invalid input(s): APPERANCEUR    Imaging: No results found.   Medications:    . albuterol  2 puff Inhalation BID  . amiodarone  200 mg Oral Daily  . apixaban  5 mg Oral BID  . atorvastatin  40 mg Oral Daily  . calcitRIOL  4 mcg Oral BID  . calcium-vitamin D  1 tablet Oral TID  . Darbepoetin Alfa  60 mcg Subcutaneous Q Sat-1800  . dexamethasone  6 mg Oral Q24H  . insulin aspart  0-9 Units Subcutaneous TID WC  . isosorbide-hydrALAZINE  1.5 tablet Oral TID  . mouth rinse   15 mL Mouth Rinse BID  . metoprolol succinate  25 mg Oral Daily  . pantoprazole  40 mg Oral Daily  . polyethylene glycol  17 g Oral Daily  . sevelamer carbonate  800 mg Oral TID WC  . tacrolimus  5 mg Oral BID  . vitamin B-12  1,000 mcg Oral Daily   albuterol, guaiFENesin-dextromethorphan, hydrocortisone cream, loratadine, senna, witch hazel-glycerin  Assessment/ Plan:   Acute kidney injury.    Baseline creatinine widely fluctuating between 2.5 and 3.5.  Renal ultrasound performed 07/06/2020 was essentially unremarkable.  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  It is of note that she has been diagnosed with worsening heart failure.  Recent worsening of kidney dysfunction could be related to Covid as well as possible cardiorenal syndrome.  Continue to monitor kidney function daily.  Holding diuretics for today given significant urine output yesterday.  Restart as needed  Anemia.  Now appears to have some pancytopenia.  Hematology to be consulted.  Bone marrow biopsy performed on 2/18.  Continue ESA therapy would recommend transfusion as needed  Immunosuppression in the face of Covid pneumonia severe infection and pancytopenia.    Holding CellCept at this time. Continue Prograf and steroids Prograf dose 5 mg twice daily. Can restart cellcept as she continues to improves.  Likely soon  Covid pneumonia as per primary service  Hypertension/volume continue diuretics.    Holding oral torsemide for today  Hypocalcemia continue vitamin D and calcium.  We will continue to follow.  On calcitriol 4 MCG twice daily.  This was increased and may need to be decreased pending her course.  Continue IV repletion as ordered    LOS: 4 Reesa Chew '@TODAY' '@11' :47 AM

## 2020-08-27 NOTE — Procedures (Signed)
Intubation Procedure Note  SAHAANA WEITMAN  [id number]  12/11/1965  Date:08/27/20  Time:8:40 PM   Provider Performing:Latissa Frick T Francie Massing    Procedure: Intubation (31500)  Indication(s) Respiratory Failure  Consent Unable to obtain consent due to emergent nature of procedure.   Anesthesia Etomidate, Versed and Rocuronium   Time Out Verified patient identification, verified procedure, site/side was marked, verified correct patient position, special equipment/implants available, medications/allergies/relevant history reviewed, required imaging and test results available.   Sterile Technique Usual hand hygeine, masks, and gloves were used   Procedure Description Patient positioned in bed supine.  Sedation given as noted above.  Patient was intubated with endotracheal tube using Glidescope.  View was Grade 1 full glottis .  Number of attempts was 1.  Colorimetric CO2 detector was consistent with tracheal placement.   Complications/Tolerance None; patient tolerated the procedure well. Chest X-ray is ordered to verify placement.   EBL Minimal   Specimen(s) None

## 2020-08-27 NOTE — Procedures (Signed)
Arterial Catheter Insertion Procedure Note  [name]  [id number]  1965/10/02  Date:08/27/20  Time:9:12 PM    Provider Performing: Wynelle Beckmann    Procedure: Insertion of Arterial Line [phone]) without US guidance  Indication(s) Blood pressure monitoring and/or need for frequent ABGs  Consent Unable to obtain consent due to emergent nature of procedure.  Anesthesia None   Time Out Verified patient identification, verified procedure, site/side was marked, verified correct patient position, special equipment/implants available, medications/allergies/relevant history reviewed, required imaging and test results available.   Sterile Technique Maximal sterile technique including full sterile barrier drape, hand hygiene, sterile gown, sterile gloves, mask, hair covering, sterile ultrasound probe cover (if used).   Procedure Description Area of catheter insertion was cleaned with chlorhexidine and draped in sterile fashion. Without real-time ultrasound guidance an arterial catheter was placed into the right radial artery.  Appropriate arterial tracings confirmed on monitor.     Complications/Tolerance None; patient tolerated the procedure well.   EBL Minimal   Specimen(s) None

## 2020-08-27 NOTE — Progress Notes (Signed)
ANTICOAGULATION CONSULT NOTE - Initial Consult  Pharmacy Consult for heparin Indication: H/o VTE   Allergies  Allergen Reactions  . Infed [Iron Dextran] Other (See Comments)    Pt has chest pain  Chest pain  . Cefazolin Itching and Nausea And Vomiting  . Ancef [Cefazolin Sodium] Other (See Comments)    Tolerated rocephin 11/03/10 Reports ancef causes itching, sweating, vomiting (x2-2009?)  . Lisinopril Swelling  . Other     Pt may be allergic to another antibiotic daughter isn't sure and pt unable to verify.    Patient Measurements: Weight: 122 kg (268 lb 15.4 oz) Heparin Dosing Weight: 122 kg  Vital Signs: Temp: 98 F (36.7 C) (02/20 1504) Temp Source: Oral (02/20 1504) BP: 209/100 (02/20 1938) Pulse Rate: 98 (02/20 1938)  Labs: Recent Labs    08/25/20 0150 08/25/20 1158 08/25/20 1158 08/26/20 0245 08/27/20 0608  HGB  --  7.8*   < > 7.3* 7.8*  HCT  --  24.8*  --  22.2* 24.6*  PLT  --  109*  --  106* 134*  CREATININE 4.06*  --   --  3.84* 3.95*   < > = values in this interval not displayed.    Estimated Creatinine Clearance: 22.9 mL/min (A) (by C-G formula based on SCr of 3.95 mg/dL (H)).   Medical History: Past Medical History:  Diagnosis Date  . Acute heart failure ([place]) 06/2020  . Blood transfusion without reported diagnosis   . CHF (congestive heart failure) ([place])   . 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 GERD (gastroesophageal reflux disease)    medication induced  . Hypertension   . Renal failure    hx of  . 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 acetaminophen (TYLENOL)  325 MG tablet Take 2 tablets (650 mg total) by mouth every 6 (six) hours as needed for mild pain (or Fever >/= 101). 50 tablet 1 Past Week at Unknown time  . albuterol (PROVENTIL) (2.5 MG/3ML) 0.083% nebulizer solution Take 3 mLs (2.5 mg total) by nebulization every 6 (six) hours as needed for wheezing or shortness of breath. (Patient taking differently: Take 2.5 mg by nebulization every 6 (six) hours as needed for shortness of breath.) 75 mL 12 Past Week at Unknown time  . amiodarone (PACERONE) 200 MG tablet Take 1 tablet (200 mg total) by mouth daily. 30 tablet 0 08/27/2020 at Unknown time  . apixaban (ELIQUIS) 5 MG TABS tablet Take 1 tablet (5 mg total) by mouth 2 (two) times daily. 60 tablet 4 08/21/2020 at 0900  . atorvastatin (LIPITOR) 40 MG tablet Take 1 tablet (40 mg total) by mouth daily. (Patient taking differently: Take 40 mg by mouth at bedtime.) 90 tablet 3 08/21/2020 at Unknown time  . calcitRIOL (ROCALTROL) 0.5 MCG capsule Take 1 capsule (0.5 mcg total) by mouth 2 (two) times daily. 60 capsule 5 08/21/2020 at Unknown time  . calcium-vitamin D (OSCAL WITH D) 500-200 MG-UNIT tablet Take 1 tablet by mouth 3 (three) times daily. 90 tablet 5 08/21/2020 at Unknown time  . DULoxetine (CYMBALTA) 20 MG capsule Take one  20 mg tablet for one week, then take 1/2 tablet for two weeks, then stop medication (Patient taking differently: Take 10 mg by mouth daily.) 30 capsule 0 08/21/2020 at Unknown time  . ferrous sulfate 325 (65 FE) MG tablet Take 1 tablet (325 mg total) by mouth 2 (two) times daily with a meal. 60 tablet 3 08/21/2020 at Unknown time  . guaiFENesin-dextromethorphan (ROBITUSSIN DM) 100-10 MG/5ML syrup Take 5 mLs by mouth every 4 (four) hours as needed for cough. 118 mL 0 08/21/2020 at Unknown time  . insulin aspart (NOVOLOG) 100 UNIT/ML injection Inject 0-20 Units into the skin 3 (three) times daily with meals. 10 mL 11 08/14/2020 at Unknown time  . isosorbide-hydrALAZINE (BIDIL) 20-37.5 MG tablet  Take 1.5 tablets by mouth 3 (three) times daily. (Patient taking differently: Take 1 tablet by mouth 3 (three) times daily.) 135 tablet 5 08/21/2020 at Unknown time  . loperamide (IMODIUM) 2 MG capsule Take 2 capsules (4 mg total) by mouth daily as needed for diarrhea or loose stools. 30 capsule 0 Past Week at Unknown time  . loratadine (CLARITIN) 10 MG tablet Take 1 tablet (10 mg total) by mouth daily. (Patient taking differently: Take 10 mg by mouth daily as needed for allergies.) 90 tablet 3 Past Week at Unknown time  . metoprolol succinate (TOPROL-XL) 25 MG 24 hr tablet Take 1 tablet (25 mg total) by mouth daily. 30 tablet 3 08/21/2020 at 0800  . mycophenolate (MYFORTIC) 180 MG EC tablet TAKE 3 TABLETS BY MOUTH TWICE DAILY (Patient taking differently: Take 540 mg by mouth 2 (two) times daily.) 540 tablet 0 08/21/2020 at Unknown time  . omeprazole (PRILOSEC) 40 MG capsule Take 40 mg by mouth daily.   08/21/2020 at Unknown time  . predniSONE (DELTASONE) 5 MG tablet Take 1 tablet (5 mg total) by mouth daily with breakfast.   08/21/2020 at Unknown time  . sevelamer carbonate (RENVELA) 800 MG tablet Take 1 tablet (800 mg total) by mouth 3 (three) times daily with meals.   08/21/2020 at Unknown time  . tacrolimus (PROGRAF) 5 MG capsule Take 1 capsule (5 mg total) by mouth 2 (two) times daily.   08/21/2020 at Unknown time  . torsemide (DEMADEX) 20 MG tablet Take 4 tablets (80 mg total) by mouth daily.   08/21/2020 at Unknown time  . triamcinolone ointment (KENALOG) 0.1 % Apply 1 application topically 2 (two) times daily as needed (rash).   Past Week at Unknown time  . vitamin B-12 (CYANOCOBALAMIN) 1000 MCG tablet Take 1 tablet (1,000 mcg total) by mouth daily. 30 tablet 3 08/21/2020 at Unknown time  . Darbepoetin Alfa (ARANESP) 60 MCG/0.3ML SOSY injection Inject 0.3 mLs (60 mcg total) into the skin every Saturday at 6 PM. 4.2 mL      Assessment: 55 YOF admitted with symptomatic anemia on Eliquis for Afib.  Pharmacy consulted to transition to IV heparin since patient was intubated. Last dose of Eliquis was this morning.   H/H low. Plt 134k. SCr 3.95.   Goal of Therapy:  Heparin level 0.3-0.7 units/ml aPTT 66-102 seconds Monitor platelets by anticoagulation protocol: Yes   Plan:  -Start heparin at 1700 units/hr. No bolus -F/u 8 hr aPTT  -Monitor daily aPTT, HL, CBC and s/s of bleeding  Albertina Parr, PharmD., BCPS, BCCCP Clinical Pharmacist Please refer to [facility] for unit-specific pharmacist

## 2020-08-27 NOTE — Progress Notes (Signed)
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   Paged this evening for acute worsening of respiratory failure. Previously noted to be on 4L. Bedside RN noted sudden shortness of breath with drop in O2 saturations associated with hemoptysis.   Initial exam: In clear respiratory distress but alert Cardiac: tachycardic, neck veins engorged, extremities warm Pulm: on 15L [place] and NR. using accessory muscles. Significant wheezing. Small amount of hemoptysis that was bright red  Primary concerns were progression to ARDS vs PE. Chart review indicates that [place] had recently been held and was resumed 2/19 after completion of a bone bx.  Plan -stat CXR -40mg  lasix -1mg  ativan  CXR revealed progression of her pulmonary infiltrates since 2/15 giving the appearance of ARDS. Dr. Heber Worthington, attending, called and notified of change in status. PCCM called for transfer however was unable to accept immediately due to a code going on simultaneously on the unit.   Pt's daughter was called and updated with plan to transfer to ICU and that she would likely need intubation.   Pt's mental status was declining, initially becoming more delirious, trying to bite and scratch staff, however then was becoming more somnolent.  Peripheral access was lost and pt's mental status was precluding immediate ability obtain access. ABG returned and consistent with respiratory acidosis--pH 7.11, pCO2 74. Escorted pt to ICU and provided handoff to [facility] provider.    Mitzi Hansen, MD Internal Medicine Resident PGY-2 [facility] Pager: [phone] 08/27/2020 8:13 PM

## 2020-08-27 NOTE — Progress Notes (Signed)
Date and time results received: 08/27/20 7:48 PM (use smartphrase ".now" to insert current time)  Test: Blood Gas Critical Value: pH 7.117; pCO2 74.3  Name of Provider Notified: E-Link Notified  Orders Received? Or Actions Taken?: Actions Taken: Awaiting further orders

## 2020-08-28 ENCOUNTER — Inpatient Hospital Stay (HOSPITAL_COMMUNITY): Payer: Medicare Other

## 2020-08-28 DIAGNOSIS — R0603 Acute respiratory distress: Secondary | ICD-10-CM

## 2020-08-28 DIAGNOSIS — U071 COVID-19: Secondary | ICD-10-CM | POA: Diagnosis not present

## 2020-08-28 DIAGNOSIS — D649 Anemia, unspecified: Secondary | ICD-10-CM | POA: Diagnosis not present

## 2020-08-28 DIAGNOSIS — M7989 Other specified soft tissue disorders: Secondary | ICD-10-CM

## 2020-08-28 LAB — APTT
aPTT: >200 seconds (ref 24–36)
aPTT: 164 seconds (ref 24–36)

## 2020-08-28 LAB — GLUCOSE, CAPILLARY
Glucose-Capillary: 134 mg/dL — ABNORMAL HIGH (ref 70–99)
Glucose-Capillary: 134 mg/dL — ABNORMAL HIGH (ref 70–99)
Glucose-Capillary: 105 mg/dL — ABNORMAL HIGH (ref 70–99)
Glucose-Capillary: 96 mg/dL (ref 70–99)
Glucose-Capillary: 90 mg/dL (ref 70–99)
Glucose-Capillary: 132 mg/dL — ABNORMAL HIGH (ref 70–99)

## 2020-08-28 LAB — CBC
HCT: 20.5 % — ABNORMAL LOW (ref 36.0–46.0)
Hemoglobin: 6.1 g/dL — CL (ref 12.0–15.0)
MCH: 28.9 pg (ref 26.0–34.0)
MCHC: 29.8 g/dL — ABNORMAL LOW (ref 30.0–36.0)
MCV: 97.2 fL (ref 80.0–100.0)
Platelets: 108 10*3/uL — ABNORMAL LOW (ref 150–400)
RBC: 2.11 MIL/uL — ABNORMAL LOW (ref 3.87–5.11)
RDW: 16.8 % — ABNORMAL HIGH (ref 11.5–15.5)
WBC: 3.7 10*3/uL — ABNORMAL LOW (ref 4.0–10.5)
nRBC: 0 % (ref 0.0–0.2)

## 2020-08-28 LAB — BASIC METABOLIC PANEL
Anion gap: 16 — ABNORMAL HIGH (ref 5–15)
Anion gap: 17 — ABNORMAL HIGH (ref 5–15)
BUN: 92 mg/dL — ABNORMAL HIGH (ref 6–20)
BUN: 93 mg/dL — ABNORMAL HIGH (ref 6–20)
CO2: 20 mmol/L — ABNORMAL LOW (ref 22–32)
CO2: 20 mmol/L — ABNORMAL LOW (ref 22–32)
Calcium: 6 mg/dL — CL (ref 8.9–10.3)
Calcium: 6.3 mg/dL — CL (ref 8.9–10.3)
Chloride: 106 mmol/L (ref 98–111)
Chloride: 104 mmol/L (ref 98–111)
Creatinine, Ser: 3.94 mg/dL — ABNORMAL HIGH (ref 0.44–1.00)
Creatinine, Ser: 3.82 mg/dL — ABNORMAL HIGH (ref 0.44–1.00)
GFR, Estimated: 13 mL/min — ABNORMAL LOW (ref >60)
GFR, Estimated: 13 mL/min — ABNORMAL LOW (ref >60)
Glucose, Bld: 132 mg/dL — ABNORMAL HIGH (ref 70–99)
Glucose, Bld: 139 mg/dL — ABNORMAL HIGH (ref 70–99)
Potassium: 3.1 mmol/L — ABNORMAL LOW (ref 3.5–5.1)
Potassium: 3.7 mmol/L (ref 3.5–5.1)
Sodium: 142 mmol/L (ref 135–145)
Sodium: 141 mmol/L (ref 135–145)

## 2020-08-28 LAB — POCT I-STAT 7, (LYTES, BLD GAS, ICA,H+H)
Acid-base deficit: 1 mmol/L (ref 0.0–2.0)
Bicarbonate: 23.9 mmol/L (ref 20.0–28.0)
Calcium, Ion: 0.85 mmol/L — CL (ref 1.15–1.40)
HCT: 20 % — ABNORMAL LOW (ref 36.0–46.0)
Hemoglobin: 6.8 g/dL — CL (ref 12.0–15.0)
O2 Saturation: 98 %
Patient temperature: 99.4
Potassium: 3 mmol/L — ABNORMAL LOW (ref 3.5–5.1)
Sodium: 142 mmol/L (ref 135–145)
TCO2: 25 mmol/L (ref 22–32)
pCO2 arterial: 38 mmHg (ref 32.0–48.0)
pH, Arterial: 7.408 (ref 7.350–7.450)
pO2, Arterial: 105 mmHg (ref 83.0–108.0)

## 2020-08-28 LAB — TRIGLYCERIDES: Triglycerides: 79 mg/dL (ref <150)

## 2020-08-28 LAB — PROTIME-INR
INR: 1.6 — ABNORMAL HIGH (ref 0.8–1.2)
Prothrombin Time: 18.7 seconds — ABNORMAL HIGH (ref 11.4–15.2)

## 2020-08-28 LAB — ECHOCARDIOGRAM LIMITED
Area-P 1/2: 2.14 cm2
P 1/2 time: 531 msec
S' Lateral: 5.1 cm
Weight: 4105.85 oz

## 2020-08-28 LAB — PREPARE RBC (CROSSMATCH)

## 2020-08-28 LAB — HEMOGLOBIN AND HEMATOCRIT, BLOOD
HCT: 22.8 % — ABNORMAL LOW (ref 36.0–46.0)
HCT: 25 % — ABNORMAL LOW (ref 36.0–46.0)
Hemoglobin: 7.2 g/dL — ABNORMAL LOW (ref 12.0–15.0)
Hemoglobin: 8 g/dL — ABNORMAL LOW (ref 12.0–15.0)

## 2020-08-28 LAB — TROPONIN I (HIGH SENSITIVITY): Troponin I (High Sensitivity): 270 ng/L (ref <18)

## 2020-08-28 LAB — HEPARIN LEVEL (UNFRACTIONATED): Heparin Unfractionated: 1.98 IU/mL — ABNORMAL HIGH (ref 0.30–0.70)

## 2020-08-28 LAB — LACTIC ACID, PLASMA: Lactic Acid, Venous: 1.3 mmol/L (ref 0.5–1.9)

## 2020-08-28 MED ORDER — FENTANYL 2500MCG IN NS 250ML (10MCG/ML) PREMIX INFUSION
0.0000 ug/h | INTRAVENOUS | Status: DC
  Administered 2020-08-28 – 2020-08-29 (×2): 50 ug/h via INTRAVENOUS
  Administered 2020-08-30: 17:00:00 100 ug/h via INTRAVENOUS
  Administered 2020-08-30: 20:00:00 125 ug/h via INTRAVENOUS
  Administered 2020-08-31: 100 ug/h via INTRAVENOUS
  Administered 2020-09-01: 175 ug/h via INTRAVENOUS
  Filled 2020-08-28 (×4): qty 250

## 2020-08-28 MED ORDER — SEVELAMER CARBONATE 800 MG PO TABS
800.0000 mg | ORAL_TABLET | Freq: Three times a day (TID) | ORAL | Status: DC
  Administered 2020-08-28 – 2020-09-01 (×13): 800 mg
  Filled 2020-08-28 (×13): qty 1

## 2020-08-28 MED ORDER — SODIUM CHLORIDE 0.9% IV SOLUTION: Freq: Once | INTRAVENOUS | Status: AC

## 2020-08-28 MED ORDER — CALCIUM GLUCONATE-NACL 2-0.675 GM/100ML-% IV SOLN
2.0000 g | Freq: Once | INTRAVENOUS | Status: AC
  Administered 2020-08-28: 2000 mg via INTRAVENOUS
  Filled 2020-08-28: qty 100

## 2020-08-28 MED ORDER — POTASSIUM CHLORIDE 20 MEQ PO PACK
40.0000 meq | PACK | Freq: Once | ORAL | Status: AC
  Administered 2020-08-28: 40 meq via ORAL
  Filled 2020-08-28: qty 2

## 2020-08-28 MED ORDER — CALCITRIOL 1 MCG/ML PO SOLN
4.0000 ug | Freq: Two times a day (BID) | ORAL | Status: DC
  Administered 2020-08-28 – 2020-09-01 (×10): 4 ug
  Filled 2020-08-28 (×14): qty 4

## 2020-08-28 MED ORDER — HEPARIN (PORCINE) 25000 UT/250ML-% IV SOLN
800.0000 [IU]/h | INTRAVENOUS | Status: DC
  Administered 2020-08-28: 20:00:00 900 [IU]/h via INTRAVENOUS
  Filled 2020-08-28: qty 250

## 2020-08-28 MED ORDER — HEPARIN (PORCINE) 25000 UT/250ML-% IV SOLN
1200.0000 [IU]/h | INTRAVENOUS | Status: DC
  Administered 2020-08-28: 1200 [IU]/h via INTRAVENOUS
  Filled 2020-08-28 (×2): qty 250

## 2020-08-28 MED ORDER — FENTANYL BOLUS VIA INFUSION
50.0000 ug | INTRAVENOUS | Status: DC | PRN
  Administered 2020-08-30 – 2020-09-01 (×6): 50 ug via INTRAVENOUS
  Filled 2020-08-28: qty 50

## 2020-08-28 MED ORDER — MAGNESIUM SULFATE 2 GM/50ML IV SOLN
2.0000 g | Freq: Once | INTRAVENOUS | Status: AC
  Administered 2020-08-28: 2 g via INTRAVENOUS
  Filled 2020-08-28: qty 50

## 2020-08-28 MED ORDER — POTASSIUM CHLORIDE 10 MEQ/50ML IV SOLN: 10.0000 meq | INTRAVENOUS | Status: DC

## 2020-08-28 MED ORDER — POTASSIUM CHLORIDE 10 MEQ/50ML IV SOLN
10.0000 meq | INTRAVENOUS | Status: DC
  Filled 2020-08-28 (×4): qty 50

## 2020-08-28 MED ORDER — POTASSIUM CHLORIDE 10 MEQ/100ML IV SOLN
10.0000 meq | INTRAVENOUS | Status: DC
  Filled 2020-08-28 (×4): qty 100

## 2020-08-28 MED ORDER — FENTANYL 2500MCG IN NS 250ML (10MCG/ML) PREMIX INFUSION
50.0000 ug/h | INTRAVENOUS | Status: DC
  Administered 2020-08-28: 50 ug/h via INTRAVENOUS

## 2020-08-28 MED ORDER — MAGNESIUM SULFATE IN D5W 1-5 GM/100ML-% IV SOLN
1.0000 g | Freq: Once | INTRAVENOUS | Status: DC
  Filled 2020-08-28: qty 100

## 2020-08-28 MED ORDER — ISOSORB DINITRATE-HYDRALAZINE 20-37.5 MG PO TABS
1.5000 | ORAL_TABLET | Freq: Three times a day (TID) | ORAL | Status: DC
  Administered 2020-08-28 – 2020-08-30 (×6): 1.5
  Filled 2020-08-28 (×8): qty 2

## 2020-08-28 NOTE — Progress Notes (Signed)
E-link contacted in regards to updating restraint order at this time.

## 2020-08-28 NOTE — Progress Notes (Deleted)
E-Link contacted over patient hypertension, needing something PRN. And new hematuria noted.

## 2020-08-28 NOTE — Progress Notes (Signed)
[place] Progress Note Patient Name: KIMIMILA TAUZIN DOB: [birth year] MRN: [id number]   Date of Service  08/28/2020  HPI/Events of Note  Multiple issues: 1. Continuous Fentanyl IV infusion order and 2. Nursing request for H/H and BMP   eICU Interventions  Plan: 1, Fentanyl IV infusion. Titrate to RASS = 0 to -1.  2. H/H and BMP now.      Intervention Category Major Interventions: Delirium, psychosis, severe agitation - evaluation and management;Electrolyte abnormality - evaluation and management  Breiana Stratmann [place] 08/28/2020, 8:13 PM

## 2020-08-28 NOTE — Progress Notes (Signed)
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  Had bone marrow biopsy 2/18.  Had rapid deterioration on the floor today, 2/20, where she had increasing oxygen requirements, and severe agitation (was hypertensive) requiring transfer to ICU.   History of Present Illness:  Patient was initially admitted for pancytopenia on 2/15 with a hemoglobin of 6.1, hypocalcemia, in the setting of COVID-19.  For her pancytopenia, she had a bone marrow biopsy on 2/18, but this has been thought to be related to chronic disease. She has been diuresed, with fluctuating creatinine (nephro on board) but good urine output.  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   She is also getting decadron $RemoveBeforeDE'6mg'YVWjrJhSBSMbKQi$  for COVID-19 pneumonia.  For her hypocalcemia, due to her CKD, she is getting vitamin D repletion, calcitriol, and as needed IV calcium replacement   She was initially on 4L of O2 for her stay until this morning, when she had increasing O2 requirements and agitated delirium.  Also noted to be hypertensive during this episode and had an episode of hemoptysis along with increasing O2 requirements.    She was very agitated on arrival to ICU on 2/20, was on nonrebreather mask, and could not get an accurate pulse ox.  ABG showed respiratory acidosis (pre-intubation).  Patient was urgently intubated due to respiratory failure.     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 CHF (EF 20-25%) RUE DVT DM HTN  Significant Hospital Events:  Bone marrow  biopsy 2/18 Intubation for acute hypoxic and hypercarbic respiratory failure 2/20  Consults:  Nephrology  Procedures:  BM biopsy 2/18 Intubation 2/20 L IJ CVC placed 2/20- in subclavian vein  Significant Diagnostic Tests:  CXR 2/20: increased bilateral alveolar infiltrates  Micro Data:  2/15 SARS Cov2 POSITIVE 2/15 MRSA screen NEGATIVE 2/20 blood cultures >> ngtd 2/20 respiratory cultures>>  Antimicrobials:     Interim History / Subjective:  2/21: remains sedated, renal insufficiency cont to worsen by indices but uop good. . hgb remains low and platelets cont to fall. Echo without rhs and stable from 12/21, le dopplers pending with RLE >>LLE  In size. On minimal vent settings, pt moves but does not follow commands. Transfusing one unit blood for hgb 6.1. no overt bleeding noted.   Objective   Blood pressure (!) 135/58, pulse (!) 114, temperature (!) 96.5 F (35.8 C), temperature source Axillary, resp. rate 20, weight 116.4 kg, last menstrual period 11/05/2012, SpO2 95 %.    Vent Mode: PRVC FiO2 (%):  [40 %-100 %] 40 % Set Rate:  [22 bmp-28 bmp] 28 bmp Vt Set:  [420 mL-560 mL] 420 mL PEEP:  [5 cmH20-8 cmH20] 8 cmH20 Plateau Pressure:  [18 cmH20-21 cmH20] 19 cmH20   Intake/Output Summary (Last 24 hours) at 08/28/2020 1043 Last data filed at 08/28/2020 0928 Gross per 24 hour  Intake 1365.39 ml  Output 1800 ml  Net -434.61 ml   Filed Weights   08/26/20 0524 08/27/20 0643 08/28/20 0500  Weight: 121.6 kg 122 kg 116.4 kg    Examination:  General:  Sedated unresponsive but spontaneously moving all 4 HENT: NCAT, eyes, equal and round, mmmp Lungs: rhonchi Cardiovascular: regular rate and rhythm Abdomen: obese soft, nontender, nondistended Extremities: pitting edema b/l R >>L le Neuro: sedated, unresponsive to verbal stim but restless   Resolved Hospital Problem list     Assessment & Plan:  55 yo F with  ATIII deficiency, Afib, CKD stage IV s/p allograft renal  transplant, CHF (EF 20-25%), RUE DVT, and COVID 19 (dx 2/13), initially admitted 2/15 for pancytopenia and hypoxic respiratory failure.  Had acute decompensation on 2/20 with increasing O2 requirements, agitation, and acute hypoxic and hypercarbic respiratory failure requiring intubation.    # Acute hypoxic and hypercarbic respiratory failure # COVID-19 # CHF exacerbation intubated 2/20, CXR with worsening infiltrates.  I suspect that she likely had acute flash pulmonary edema with underlying CHF- she it appears to have bilateral alveolar infitlrates and she was hypertensive on the floor.  She also reported had evidence of hemoptysis in the setting of anticoagulation.  Worsening of COVID is also in differential.   - possible fat embolism from bone marrow biopsy yesterday.  Will further consider this if does not improve with diuresis. - also PE is of consideration, her AC had been pasued for the BM biopsy ( still pending), but otherwise she has been on anticoagulation.  Will hold on a CTA PE for now in light of her poor kidney function, but will consider if does not improve with diuresis. Plan: - ventilation with goal of 6cc/kg - diuresis with goal at least 1-2 L net negative - respiratory and blood cultures.  No evidence of new infection yet, so will hold empiric abx for now - continue decadron $RemoveBeforeDEI'6mg'txPvvEMmxdkPhngQ$  daily for COVID-19 treatment - troponin and EKG - stop eliquis--> heparin gtt  # Acute encephalopathy: Agitated delirium now resolved s/p intubation and sedation.  Agitation likely precipitated by worsening hypoxia on the floor.  - daily spontaneous awaking trials  # Pancytopenia: s/p bone marrow biopsy 2/18.  Likely from chronic illness, and advanced renal disease - follow up BM bx results - trend CBC - no need for trasnfusion at this time  # CKD stage V # Hypocalcemia Currently has good UOP with diuresis. Has been given lasix $RemoveBef'40mg'SdBewMIyTQ$  today. Nephrology on board - follow up UOP today, give  additional lasix if need to get net negative - appreciate nephrology recs - BMP now  # Hypertension: currenly on bidil, amiodarone, and toprol XL - holding metoprolol while getting sedation.  Can restart if BP tolerates - continue bidil and amiodarone for now  # pAFib: in sinus now - continue amiodarone  # AT III deficiency # RUE DVT - continue anticoagulation.  Switch from eliquis to heparin  Best practice (evaluated daily)  Diet: NPO Pain/Anxiety/Delirium protocol (if indicated): yes VAP protocol (if indicated): yes DVT prophylaxis: heparin gtt GI prophylaxis: ppi  Glucose control: SSI q4H Mobility: n/a Disposition:ICU  Goals of Care:  Last date of multidisciplinary goals of care discussion: family aware of transfer to ICU Family and staff present: no Summary of discussion: Follow up goals of care discussion due:  Code Status: Full  Labs   CBC: Recent Labs  Lab 09/04/2020 0915 08/27/2020 2040 08/23/20 0250 08/24/20 0121 08/25/20 1158 08/26/20 0245 08/27/20 0608 08/27/20 2018 08/27/20 2212 08/27/20 2357 08/28/20 0445  WBC 2.3*  --  2.4*   < > 2.8* 3.4* 3.3*  --  5.3  --  3.7*  NEUTROABS 1.9  --  2.0  --  2.2  --   --   --  4.5  --   --   HGB 6.1*   < > 7.8*   < > 7.8* 7.3* 7.8* 7.5* 7.0* 6.8* 6.1*  HCT 19.4*   < > 23.7*   < > 24.8* 22.2* 24.6* 22.0* 23.0* 20.0* 20.5*  MCV 95.6  --  92.6   < > 94.7 92.9 94.3  --  96.2  --  97.2  PLT 81*  --  89*   < > 109* 106* 134*  --  138*  --  108*   < > = values in this interval not displayed.    Basic Metabolic Panel: Recent Labs  Lab 08/24/20 0121 08/25/20 0150 08/26/20 0245 08/27/20 0223 08/27/20 0608 08/27/20 2018 08/27/20 2212 08/27/20 2357 08/28/20 0445  NA 142 142 141  --  142 142 138 142 142  K 4.1 4.2 4.0  --  3.9 3.2* 3.0* 3.0* 3.1*  CL 107 107 105  --  103  --  102  --  106  CO2 21* 21* 23  --  21*  --  18*  --  20*  GLUCOSE 189* 224* 203*  --  179*  --  178*  --  132*  BUN 89* 90* 87*  --  90*  --   92*  --  92*  CREATININE 4.01* 4.06* 3.84*  --  3.95*  --  3.88*  --  3.94*  CALCIUM 5.5* 5.6* 5.7*  --  6.2*  --  5.9*  --  6.0*  MG 1.6* 1.8 1.6* 1.6*  --   --  1.6*  --   --   PHOS 5.7* 5.2* 5.1* 5.1* 5.2*  --   --   --   --    GFR: Estimated Creatinine Clearance: 22.4 mL/min (A) (by C-G formula based on SCr of 3.94 mg/dL (H)). Recent Labs  Lab 08/26/20 0245 08/27/20 0608 08/27/20 2212 08/28/20 0445 08/28/20 0644  WBC 3.4* 3.3* 5.3 3.7*  --   LATICACIDVEN  --   --  1.5  --  1.3    Liver Function Tests: Recent Labs  Lab 08/24/2020 0915 08/23/20 0250 08/24/20 0121 08/25/20 0150 08/27/20 0608  AST $Re'15 15 16 22  'pfg$ --   ALT $Re'14 13 13 16  'TrB$ --   ALKPHOS 468* 489* 398* 403*  --   BILITOT 0.2* 0.5 0.5 0.5  --   PROT 5.4* 5.5* 4.8* 5.3*  --   ALBUMIN 3.1* 3.1* 2.8* 3.0* 3.1*   No results for input(s): LIPASE, AMYLASE in the last 168 hours. No results for input(s): AMMONIA in the last 168 hours.  ABG    Component Value Date/Time   PHART 7.408 08/27/2020 2357   PCO2ART 38.0 08/27/2020 2357   PO2ART 105 08/27/2020 2357   HCO3 23.9 08/27/2020 2357   TCO2 25 08/27/2020 2357   ACIDBASEDEF 1.0 08/27/2020 2357   O2SAT 98.0 08/27/2020 2357     Coagulation Profile: Recent Labs  Lab 08/24/20 0121 08/27/20 2212 08/28/20 0445  INR 1.3* 1.6* 1.6*    Cardiac Enzymes: No results for input(s): CKTOTAL, CKMB, CKMBINDEX, TROPONINI in the last 168 hours.  HbA1C: Hemoglobin A1C  Date/Time Value Ref Range Status  12/02/2017 12:00 AM 4.8  Final  08/04/2017 12:00 AM 5.3  Final   Hgb A1c MFr Bld  Date/Time Value Ref Range Status  07/05/2020 05:32 AM 5.6 4.8 - 5.6 % Final    Comment:    (NOTE) Pre diabetes:  5.7%-6.4%  Diabetes:              >6.4%  Glycemic control for   <7.0% adults with diabetes   03/14/2020 10:15 PM 5.0 4.8 - 5.6 % Final    Comment:    (NOTE) Pre diabetes:          5.7%-6.4%  Diabetes:              >6.4%  Glycemic control for   <7.0% adults  with diabetes     CBG: Recent Labs  Lab 08/27/20 2011 08/27/20 2257 08/28/20 0200 08/28/20 0318 08/28/20 0811  GLUCAP 194* 144* 134* 134* 105*    Critical care time: The patient is critically ill with multiple organ systems failure and requires high complexity decision making for assessment and support, frequent evaluation and titration of therapies, application of advanced monitoring technologies and extensive interpretation of multiple databases.  Critical care time 39 mins. This represents my time independent of the NPs time taking care of the pt. This is excluding procedures.    Audria Nine DO [facility] 08/28/2020, 10:44 AM See Amion for pager If no response to pager, please call [phone] until 1900 After 1900 please call [facility] [phone]

## 2020-08-28 NOTE — TOC Benefit Eligibility Note (Addendum)
Transition of Care [facility]) Benefit Eligibility Note    Patient Details  Name: HENRIETTA CIESLEWICZ MRN: [id number] Date of Birth: Nov 17, 1965   Medication/Dose: Pearson Grippe --  COVER-YES      and       LOVENOX  : NOT COVER / [place] # [phone]   no dose provided  Covered?: Yes  Tier: 2 Drug  Prescription Coverage Preferred Pharmacy: [facility] with Person/Company/Phone Number:: [employer]   @ [employer] [facility] # [phone]  Co-Pay: $ 1.35  Prior Approval: No  Deductible:  (NO DEDUCTIBLE  WITH PLAN  /  [place])   ADDITIONAL NOTES:  MEDICAID  OF [place]. EFF-DATE : 11-03-2011, CO-PAY-$ 4.00 FOR EACH PRESCRIPTION      Memory Argue Phone Number: 08/28/2020, 1:18 PM

## 2020-08-28 NOTE — Progress Notes (Signed)
Lower extremity venous bilateral study completed.  Preliminary results relayed to Mendel Ryder, [place].   See CV Proc for preliminary results report.   Darlin Coco, RDMS

## 2020-08-28 NOTE — Progress Notes (Signed)
[place] Progress Note Patient Name: [name] DOB: 1966-03-08 MRN: [id number]   Date of Service  08/28/2020  HPI/Events of Note  Hypocalcemia - Ca++ = 6.3 and Phosphorus = 5.2   eICU Interventions  Plan:  1. Replace Ca++.      Intervention Category Major Interventions: Electrolyte abnormality - evaluation and management  Lysle Dingwall 08/28/2020, 9:37 PM

## 2020-08-28 NOTE — Progress Notes (Signed)
ANTICOAGULATION CONSULT NOTE - Follow Up Consult  Pharmacy Consult for heparin Indication: h/o VTE and PAF  Labs: Recent Labs    08/27/20 0608 08/27/20 2018 08/27/20 2212 08/27/20 2357 08/28/20 0445  HGB 7.8*   < > 7.0* 6.8* 6.1*  HCT 24.6*   < > 23.0* 20.0* 20.5*  PLT 134*  --  138*  --  108*  APTT  --   --   --   --  >200*  LABPROT  --   --  18.8*  --  18.7*  INR  --   --  1.6*  --  1.6*  HEPARINUNFRC  --   --   --   --  1.98*  CREATININE 3.95*  --  3.88*  --  3.94*  TROPONINIHS  --   --  237*  --  270*   < > = values in this interval not displayed.    Assessment: 55yo female supratherapeutic on heparin with initial dosing while Eliquis on hold; no gtt issues per RN but Hgb is low and trending down, being transfused now, no overt bleeding, had been admitted for symptomatic anemia.  Goal of Therapy:  aPTT 66-102 seconds   Plan:  Will hold heparin gtt x32min then decrease heparin gtt by 4 units/kg/hr to 1200 units/hr and check PTT in 6 hours.    Wynona Neat, PharmD, BCPS  08/28/2020,6:21 AM

## 2020-08-28 NOTE — Progress Notes (Addendum)
[facility] Progress Note Patient Name: [name] DOB: 12/26/1965 MRN: [id number]   Date of Service  08/28/2020  HPI/Events of Note  Multiple issues: 1. K+ = 3.0, Mg++ = 1.6 and Creatinine = 3.88. 2. Anemia - Hgb = 6.8. 3. ABG on 50%/PRVC 28/TV 420/P 8 = 7.408/38.0/105 and 4. Troponin = 111 --> 118 -->237. Demand ischemia? Already on a Heparin IV infusion.   eICU Interventions  Plan: 1. Replace Mg++. 2. Given AKI defer K+ replacement to renal service.  3. Transfuse 1 unit PRBC now. 4. Continue present ventilator management.  5. 12 Lead EKG STAT.     Intervention Category Major Interventions: Electrolyte abnormality - evaluation and management  Sommer,Steven Eugene 08/28/2020, 1:14 AM

## 2020-08-28 NOTE — Progress Notes (Signed)
[facility] ROUNDING NOTE   Subjective:    had 3 liters UOP over 2/20.  Has been on no pressors and on sedation.  Remains intubated  Review of systems:  Unable to obtain 2/2 intubated and sedated  ------------------ Brief History:  55 year old lady with a history of renal transplant cadaveric 2011 appears like a baseline creatinine about 2.5 mg/dL based on notes from office.  However during her hospitalizations in January it looks like her creatinine has gradually been worsening.  On 08/06/2020 it did increase to 3.66 mg/dL. she has a history of hypertension and congestive heart failure with a diminished ejection fraction of about 30%.  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  She was diagnosed with COVID-19 pneumonia 08/20/2020.  She has been admitted for Decadron and close monitoring. she has been evaluated by hematology and plans will be for a bone marrow biopsy as an inpatient.  Most recent hospitalizations #1 07/04/2020  -9/[id number]/[id number] complications of acute on chronic heart failure with a creatinine that had increased during hospitalization. Most recent hospitalizations #2   08/01/2020 -08/06/2020 with worsening anemia.   Objective:  Vital signs in last 24 hours:  Temp:  [97.8 F (36.6 C)-99.4 F (37.4 C)] 98.2 F (36.8 C) (02/21 0625) Pulse Rate:  [52-98] 52 (02/21 0722) Resp:  [18-28] 28 (02/21 0800) BP: (135-209)/(58-100) 135/58 (02/21 0722) SpO2:  [90 %-100 %] 100 % (02/21 0800) Arterial Line BP: (123-171)/(50-67) 171/67 (02/21 0800) FiO2 (%):  [40 %-100 %] 40 % (02/21 0800) Weight:  [116.4 kg] 116.4 kg (02/21 0500)  Weight change: -5.6 kg Filed Weights   08/26/20 0524 08/27/20 0643 08/28/20 0500  Weight: 121.6 kg 122 kg 116.4 kg    Intake/Output: I/O last 3 completed shifts: In: 1099.5 [P.O.:490; I.V.:479.1; IV Piggyback:130.4] Out: 3000 [Urine:3000]   Intake/Output this  shift:  Total I/O In: 30.4 [I.V.:30.4] Out: -   General adult female in bed critically ill  HEENT normocephalic atraumatic  Neck normal circumference trachea midline Lungs clear but reduced on resp auscultation; FI02 40 and PEEP 8 Heart S1S2 no rub Abdomen soft nontender nondistended Extremities 1+ edema  Neuro sedation continuously running    Basic Metabolic Panel: Recent Labs  Lab 08/24/20 0121 08/25/20 0150 08/26/20 0245 08/27/20 0223 08/27/20 0608 08/27/20 2018 08/27/20 2212 08/27/20 2357 08/28/20 0445  NA 142 142 141  --  142 142 138 142 142  K 4.1 4.2 4.0  --  3.9 3.2* 3.0* 3.0* 3.1*  CL 107 107 105  --  103  --  102  --  106  CO2 21* 21* 23  --  21*  --  18*  --  20*  GLUCOSE 189* 224* 203*  --  179*  --  178*  --  132*  BUN 89* 90* 87*  --  90*  --  92*  --  92*  CREATININE 4.01* 4.06* 3.84*  --  3.95*  --  3.88*  --  3.94*  CALCIUM 5.5* 5.6* 5.7*  --  6.2*  --  5.9*  --  6.0*  MG 1.6* 1.8 1.6* 1.6*  --   --  1.6*  --   --   PHOS 5.7* 5.2* 5.1* 5.1* 5.2*  --   --   --   --     Liver Function Tests: Recent Labs  Lab 08/09/2020 0915 08/23/20 0250 08/24/20 0121 08/25/20 0150 08/27/20 0608  AST $Re'15 15 16 22  'bdC$ --  ALT '14 13 13 16  '$ --   ALKPHOS 468* 489* 398* 403*  --   BILITOT 0.2* 0.5 0.5 0.5  --   PROT 5.4* 5.5* 4.8* 5.3*  --   ALBUMIN 3.1* 3.1* 2.8* 3.0* 3.1*   CBC: Recent Labs  Lab 08/27/2020 0915 08/31/2020 2040 08/23/20 0250 08/24/20 0121 08/25/20 1158 08/26/20 0245 08/27/20 0608 08/27/20 2018 08/27/20 2212 08/27/20 2357 08/28/20 0445  WBC 2.3*  --  2.4*   < > 2.8* 3.4* 3.3*  --  5.3  --  3.7*  NEUTROABS 1.9  --  2.0  --  2.2  --   --   --  4.5  --   --   HGB 6.1*   < > 7.8*   < > 7.8* 7.3* 7.8* 7.5* 7.0* 6.8* 6.1*  HCT 19.4*   < > 23.7*   < > 24.8* 22.2* 24.6* 22.0* 23.0* 20.0* 20.5*  MCV 95.6  --  92.6   < > 94.7 92.9 94.3  --  96.2  --  97.2  PLT 81*  --  89*   < > 109* 106* 134*  --  138*  --  108*   < > = values in this interval not  displayed.    CBG: Recent Labs  Lab 08/27/20 2011 08/27/20 2257 08/28/20 0200 08/28/20 0318 08/28/20 0811  GLUCAP 194* 144* 134* 134* 105*    Microbiology: Results for orders placed or performed during the hospital encounter of 08/31/2020  Resp Panel by RT-PCR (Flu A&B, Covid) Nasopharyngeal Swab     Status: Abnormal   Collection Time: 08/16/2020  9:51 AM   Specimen: Nasopharyngeal Swab; Nasopharyngeal(NP) swabs in vial transport medium  Result Value Ref Range Status   SARS Coronavirus 2 by RT PCR POSITIVE (A) NEGATIVE Final    Comment: RESULT CALLED TO, READ BACK BY AND VERIFIED WITH: RN MISSY E. [phone] (NOTE) SARS-CoV-2 target nucleic acids are DETECTED.  The SARS-CoV-2 RNA is generally detectable in upper respiratory specimens during the acute phase of infection. Positive results are indicative of the presence of the identified virus, but do not rule out bacterial infection or co-infection with other pathogens not detected by the test. Clinical correlation with patient history and other diagnostic information is necessary to determine patient infection status. The expected result is Negative.  Fact Sheet for Patients: EntrepreneurPulse.com.au  Fact Sheet for Healthcare Providers: IncredibleEmployment.be  This test is not yet approved or cleared by the [place] FDA and  has been authorized for detection and/or diagnosis of SARS-CoV-2 by FDA under an Emergency Use Authorization (EUA).  This EUA will remain in effect (meaning this test can be used) for  the duration of  the COVID-19 declaration under Section 564(b)(1) of the Act, 21 U.S.C. section 360bbb-3(b)(1), unless the authorization is terminated or revoked sooner.     Influenza A by PCR NEGATIVE NEGATIVE Final   Influenza B by PCR NEGATIVE NEGATIVE Final    Comment: (NOTE) The Xpert Xpress SARS-CoV-2/FLU/RSV plus assay is intended as an aid in the diagnosis of  influenza from Nasopharyngeal swab specimens and should not be used as a sole basis for treatment. Nasal washings and aspirates are unacceptable for Xpert Xpress SARS-CoV-2/FLU/RSV testing.  Fact Sheet for Patients: EntrepreneurPulse.com.au  Fact Sheet for Healthcare Providers: IncredibleEmployment.be  This test is not yet approved or cleared by the [place] FDA and has been authorized for detection and/or diagnosis of SARS-CoV-2 by FDA under an Emergency Use Authorization (EUA). This EUA  will remain in effect (meaning this test can be used) for the duration of the COVID-19 declaration under Section 564(b)(1) of the Act, 21 U.S.C. section 360bbb-3(b)(1), unless the authorization is terminated or revoked.  Performed at [facility], [address] [place], [place] [address]   MRSA PCR Screening     Status: None   Collection Time: 08/19/2020 11:16 PM   Specimen: Nasal Mucosa; Nasopharyngeal  Result Value Ref Range Status   MRSA by PCR NEGATIVE NEGATIVE Final    Comment:        The GeneXpert MRSA Assay (FDA approved for NASAL specimens only), is one component of a comprehensive MRSA colonization surveillance program. It is not intended to diagnose MRSA infection nor to guide or monitor treatment for MRSA infections. Performed at [facility], [address] [place], [place] [address]   Culture, blood (routine x 2)     Status: None (Preliminary result)   Collection Time: 08/27/20 10:54 PM   Specimen: BLOOD RIGHT HAND  Result Value Ref Range Status   Specimen Description BLOOD RIGHT HAND  Final   Special Requests   Final    BOTTLES DRAWN AEROBIC ONLY Blood Culture results may not be optimal due to an inadequate volume of blood received in culture bottles   Culture   Final    NO GROWTH < 12 HOURS Performed at [facility], [address] [place], [place] [address]    Report Status PENDING  Incomplete  Culture,  blood (routine x 2)     Status: None (Preliminary result)   Collection Time: 08/27/20 10:58 PM   Specimen: BLOOD RIGHT ARM  Result Value Ref Range Status   Specimen Description BLOOD RIGHT ARM  Final   Special Requests   Final    BOTTLES DRAWN AEROBIC ONLY Blood Culture results may not be optimal due to an inadequate volume of blood received in culture bottles   Culture   Final    NO GROWTH < 12 HOURS Performed at [facility], [address] [place], [place] [address]    Report Status PENDING  Incomplete  Culture, Respiratory w Gram Stain     Status: None (Preliminary result)   Collection Time: 08/28/20  1:57 AM   Specimen: Tracheal Aspirate; Respiratory  Result Value Ref Range Status   Specimen Description TRACHEAL ASPIRATE  Final   Special Requests NONE  Final   Gram Stain   Final    FEW WBC PRESENT,BOTH PMN AND MONONUCLEAR FEW GRAM NEGATIVE RODS RARE GRAM POSITIVE COCCI IN CHAINS Performed at [facility], [address] [place], [place] [address]    Culture PENDING  Incomplete   Report Status PENDING  Incomplete    Coagulation Studies: Recent Labs    08/27/20 11/25/10 08/28/20 0445  LABPROT 18.8* 18.7*  INR 1.6* 1.6*    Urinalysis: No results for input(s): COLORURINE, LABSPEC, PHURINE, GLUCOSEU, HGBUR, BILIRUBINUR, KETONESUR, PROTEINUR, UROBILINOGEN, NITRITE, LEUKOCYTESUR in the last 72 hours.  Invalid input(s): APPERANCEUR    Imaging: DG Abd 1 View  Result Date: 08/27/2020 CLINICAL DATA:  Tube placement EXAM: ABDOMEN - 1 VIEW COMPARISON:  AUB 02/21/2007 FINDINGS: OG tube tip projects over the gastric antrum/pylorus. The tip is pointed distally. The visualized bowel gas pattern is nonspecific. IMPRESSION: OG tube tip projects over the gastric antrum/pylorus. Electronically Signed   By: Katherine Mantle M.D.   On: 08/27/2020 20:07   DG CHEST PORT 1 VIEW  Result Date: 08/27/2020  CLINICAL DATA:  Central line placement EXAM: PORTABLE CHEST 1 VIEW  COMPARISON:  August 27, 2020 FINDINGS: A left central line is been placed in the interval. The distal tip crosses midline to the right with the tip directed slightly superiorly. I suspect the distal tip is near the confluence of the right subclavian vein and brachiocephalic vein. No pneumothorax. Diffuse bilateral pulmonary opacities are similar in the interval. Stable cardiomediastinal silhouette. No pneumothorax. The ETT is in good position. The NG tube terminates below today's film. IMPRESSION: 1. The left central line crosses midline with the tip directed slightly in a cranial direction suggesting the tip may be near the confluency of the right subclavian vein and brachiocephalic vein. Recommend repositioning before use. 2. Other support apparatus as above. 3. Continued bilateral pulmonary infiltrates. These results will be called to the ordering clinician or representative by the Radiologist Assistant, and communication documented in the PACS or [employer]. Electronically Signed   By: Dorise Bullion III M.D   On: 08/27/2020 20:49   DG CHEST PORT 1 VIEW  Result Date: 08/27/2020 CLINICAL DATA:  Tube placement EXAM: PORTABLE CHEST 1 VIEW COMPARISON:  02/30/2022 FINDINGS: The endotracheal tube terminates above the carina. The OG tube terminates below the left hemidiaphragm. Diffuse bilateral hazy airspace opacities are noted. There is no pneumothorax. There may be a small right-sided pleural effusion. The heart size is stable. There is no acute osseous abnormality. IMPRESSION: 1. Endotracheal tube terminates above the carina. 2. OG tube terminates below the left hemidiaphragm. 3. Diffuse bilateral hazy airspace opacities. Electronically Signed   By: Constance Holster M.D.   On: 08/27/2020 20:07   DG CHEST PORT 1 VIEW  Result Date: 08/27/2020 CLINICAL DATA:  COVID positive with acute respiratory failure. EXAM: PORTABLE CHEST 1 VIEW COMPARISON:  August 22, 2020 FINDINGS: Moderate to marked  severity bilateral multifocal infiltrates are seen. There is no evidence of a pleural effusion or pneumothorax. The cardiac silhouette is markedly enlarged. The visualized skeletal structures are unremarkable. IMPRESSION: Moderate to marked severity bilateral multifocal infiltrates. Electronically Signed   By: Virgina Norfolk M.D.   On: 08/27/2020 18:58     Medications:   . fentaNYL infusion INTRAVENOUS 50 mcg/hr (08/28/20 0800)  . heparin 1,200 Units/hr (08/28/20 0800)  . propofol (DIPRIVAN) infusion 40 mcg/kg/min (08/28/20 0801)   . amiodarone  200 mg Per Tube Daily  . atorvastatin  40 mg Per Tube Daily  . calcitRIOL  4 mcg Per Tube BID  . calcium-vitamin D  1 tablet Per Tube TID  . chlorhexidine gluconate (MEDLINE KIT)  15 mL Mouth Rinse BID  . Chlorhexidine Gluconate Cloth  6 each Topical Daily  . Chlorhexidine Gluconate Cloth  6 each Topical Daily  . Darbepoetin Alfa  60 mcg Subcutaneous Q Sat-1800  . dexamethasone  6 mg Per Tube Q24H  . docusate  100 mg Per Tube BID  . fentaNYL (SUBLIMAZE) injection  50 mcg Intravenous Once  . insulin aspart  0-9 Units Subcutaneous Q4H  . ipratropium-albuterol  3 mL Nebulization Q6H  . isosorbide-hydrALAZINE  1.5 tablet Per Tube TID  . mouth rinse  15 mL Mouth Rinse 10 times per day  . pantoprazole sodium  40 mg Per Tube Daily  . polyethylene glycol  17 g Per Tube Daily  . sevelamer carbonate  800 mg Per Tube TID WC  . sodium chloride flush  10-40 mL Intracatheter Q12H  . tacrolimus  5 mg Per Tube BID  . vitamin B-12  1,000  mcg Per Tube Daily   albuterol, fentaNYL, hydrocortisone cream, senna, sodium chloride flush, witch hazel-glycerin  Assessment/ Plan:   Acute kidney injury.    Baseline creatinine widely fluctuating between 2.5 and 3.5.  Renal ultrasound performed 07/06/2020 was essentially unremarkable.  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  It is of note that she has been diagnosed with worsening heart failure.  Recent worsening of kidney dysfunction could be related to Covid as well as possible cardiorenal syndrome.    Continue supportive care  Note diuretics on hold   Anemia CKD.  Now appears to have some pancytopenia.  Hematology was called - per charting not seen.  Bone marrow biopsy performed on 2/18.  Continue ESA therapy. would recommend transfusion as needed  Immunosuppression in the face of Covid pneumonia severe infection and pancytopenia.    Holding CellCept at this time. Continue Prograf and steroids Prograf dose 5 mg twice daily. Can restart cellcept as she continues to improves.  Likely soon  Covid pneumonia as per primary service  Hypertension/volume continue diuretics.    Holding oral torsemide for today  Hypocalcemia continue vitamin D and calcium.  We will continue to follow.  On calcitriol. Note this was increased and may need to be decreased pending her course.  Continue IV repletion as ordered  Hypokalemia, hypomag - repleted this AM   Claudia Desanctis, MD 08/28/2020 9:32 AM

## 2020-08-28 NOTE — Progress Notes (Addendum)
Critical Lab Calcium 6.3. E-link nurse notified at this time.

## 2020-08-28 NOTE — Progress Notes (Signed)
Additional EKG performed due to issues with previous EKG transmission to [place].  1st degree AV block, occasional PVCs, LBBB, QTC 540. Shown to [facility] on camera.

## 2020-08-28 NOTE — Progress Notes (Signed)
ANTICOAGULATION CONSULT NOTE - Follow Up Consult  Pharmacy Consult for heparin Indication: h/o VTE and PAF  Labs: Recent Labs    08/27/20 0608 08/27/20 2018 08/27/20 2212 08/27/20 2357 08/28/20 0445 08/28/20 1121 08/28/20 1738  HGB 7.8*   < > 7.0* 6.8* 6.1* 7.2*  --   HCT 24.6*   < > 23.0* 20.0* 20.5* 22.8*  --   PLT 134*  --  138*  --  108*  --   --   APTT  --   --   --   --  >200*  --  164*  LABPROT  --   --  18.8*  --  18.7*  --   --   INR  --   --  1.6*  --  1.6*  --   --   HEPARINUNFRC  --   --   --   --  1.98*  --   --   CREATININE 3.95*  --  3.88*  --  3.94*  --   --   TROPONINIHS  --   --  237*  --  270*  --   --    < > = values in this interval not displayed.    Assessment: 55yo female supratherapeutic on heparin with initial dosing while Eliquis on hold; no gtt issues per RN but Hgb is low and trending down, being transfused now, no overt bleeding, had been admitted for symptomatic anemia.  aPTT 164 on 1200 units/hr is supratherapeutic. Level drawn appropriately per RN. Hgb 7.2. Plt 108. No reported bleeding.  Goal of Therapy:  aPTT 66-102 seconds   Plan:  Hold heparin infusion for 1 hr  Restart heparin after 1 hour at 900 units/hr at 2000 Check aPTT at 0400 Monitor aPTT, heparin level, CBC and S/S of bleeding daily  Cristela Felt, PharmD Clinical Pharmacist  08/28/2020,6:38 PM

## 2020-08-28 NOTE — Progress Notes (Signed)
  Echocardiogram 2D Echocardiogram has been performed.  [name] 08/28/2020, 8:43 AM

## 2020-08-28 NOTE — Progress Notes (Signed)
Date and time results received: 08/28/20 0609  Test: PTT  Critical Value: >200  Name of Provider Notified: [facility] Pharm[facility]   Orders Received? Or Actions Taken?:

## 2020-08-29 DIAGNOSIS — J9601 Acute respiratory failure with hypoxia: Secondary | ICD-10-CM | POA: Diagnosis not present

## 2020-08-29 LAB — TYPE AND SCREEN
ABO/RH(D): A POS
Antibody Screen: NEGATIVE
Unit division: 0

## 2020-08-29 LAB — BPAM RBC
Blood Product Expiration Date: 202203082359
ISSUE DATE / TIME: 202202210602
Unit Type and Rh: 6200

## 2020-08-29 LAB — BASIC METABOLIC PANEL
Anion gap: 16 — ABNORMAL HIGH (ref 5–15)
BUN: 94 mg/dL — ABNORMAL HIGH (ref 6–20)
CO2: 20 mmol/L — ABNORMAL LOW (ref 22–32)
Calcium: 6.3 mg/dL — CL (ref 8.9–10.3)
Chloride: 103 mmol/L (ref 98–111)
Creatinine, Ser: 3.89 mg/dL — ABNORMAL HIGH (ref 0.44–1.00)
GFR, Estimated: 13 mL/min — ABNORMAL LOW (ref >60)
Glucose, Bld: 158 mg/dL — ABNORMAL HIGH (ref 70–99)
Potassium: 3.4 mmol/L — ABNORMAL LOW (ref 3.5–5.1)
Sodium: 139 mmol/L (ref 135–145)

## 2020-08-29 LAB — CBC
HCT: 23 % — ABNORMAL LOW (ref 36.0–46.0)
Hemoglobin: 7.7 g/dL — ABNORMAL LOW (ref 12.0–15.0)
MCH: 29.6 pg (ref 26.0–34.0)
MCHC: 33.5 g/dL (ref 30.0–36.0)
MCV: 88.5 fL (ref 80.0–100.0)
Platelets: 125 10*3/uL — ABNORMAL LOW (ref 150–400)
RBC: 2.6 MIL/uL — ABNORMAL LOW (ref 3.87–5.11)
RDW: 18 % — ABNORMAL HIGH (ref 11.5–15.5)
WBC: 2.2 10*3/uL — ABNORMAL LOW (ref 4.0–10.5)
nRBC: 0 % (ref 0.0–0.2)

## 2020-08-29 LAB — PHOSPHORUS: Phosphorus: 5 mg/dL — ABNORMAL HIGH (ref 2.5–4.6)

## 2020-08-29 LAB — APTT
aPTT: 126 seconds — ABNORMAL HIGH (ref 24–36)
aPTT: 130 seconds — ABNORMAL HIGH (ref 24–36)
aPTT: 65 seconds — ABNORMAL HIGH (ref 24–36)

## 2020-08-29 LAB — HEPARIN LEVEL (UNFRACTIONATED): Heparin Unfractionated: 1.04 IU/mL — ABNORMAL HIGH (ref 0.30–0.70)

## 2020-08-29 LAB — SURGICAL PATHOLOGY

## 2020-08-29 LAB — GLUCOSE, CAPILLARY
Glucose-Capillary: 165 mg/dL — ABNORMAL HIGH (ref 70–99)
Glucose-Capillary: 154 mg/dL — ABNORMAL HIGH (ref 70–99)
Glucose-Capillary: 148 mg/dL — ABNORMAL HIGH (ref 70–99)
Glucose-Capillary: 148 mg/dL — ABNORMAL HIGH (ref 70–99)
Glucose-Capillary: 134 mg/dL — ABNORMAL HIGH (ref 70–99)

## 2020-08-29 LAB — PROTIME-INR
INR: 1.4 — ABNORMAL HIGH (ref 0.8–1.2)
Prothrombin Time: 16.7 seconds — ABNORMAL HIGH (ref 11.4–15.2)

## 2020-08-29 LAB — MAGNESIUM: Magnesium: 2 mg/dL (ref 1.7–2.4)

## 2020-08-29 MED ORDER — HEPARIN (PORCINE) 25000 UT/250ML-% IV SOLN
900.0000 [IU]/h | INTRAVENOUS | Status: DC
  Administered 2020-08-29: 600 [IU]/h via INTRAVENOUS
  Administered 2020-08-30: 750 [IU]/h via INTRAVENOUS
  Administered 2020-09-01: 800 [IU]/h via INTRAVENOUS
  Filled 2020-08-29 (×3): qty 250

## 2020-08-29 MED ORDER — CALCIUM GLUCONATE-NACL 2-0.675 GM/100ML-% IV SOLN
2.0000 g | Freq: Once | INTRAVENOUS | Status: AC
  Administered 2020-08-29: 2000 mg via INTRAVENOUS
  Filled 2020-08-29: qty 100

## 2020-08-29 MED ORDER — VITAL 1.5 CAL PO LIQD
1000.0000 mL | ORAL | Status: DC
  Administered 2020-08-29 – 2020-08-31 (×3): 1000 mL
  Filled 2020-08-29 (×3): qty 1000

## 2020-08-29 MED ORDER — FUROSEMIDE 10 MG/ML IJ SOLN
60.0000 mg | Freq: Once | INTRAMUSCULAR | Status: AC
  Administered 2020-08-29: 60 mg via INTRAVENOUS
  Filled 2020-08-29: qty 6

## 2020-08-29 MED ORDER — NOREPINEPHRINE 4 MG/250ML-% IV SOLN
0.0000 ug/min | INTRAVENOUS | Status: DC
  Administered 2020-08-30: 10:00:00 5 ug/min via INTRAVENOUS
  Administered 2020-08-31: 2 ug/min via INTRAVENOUS
  Administered 2020-09-01: 50 ug/min via INTRAVENOUS
  Administered 2020-09-01: 40 ug/min via INTRAVENOUS
  Administered 2020-09-01: 30 ug/min via INTRAVENOUS
  Administered 2020-09-01: 5 ug/min via INTRAVENOUS
  Filled 2020-08-29 (×6): qty 250

## 2020-08-29 MED ORDER — DARBEPOETIN ALFA 60 MCG/0.3ML IJ SOSY: 100.0000 ug | PREFILLED_SYRINGE | INTRAMUSCULAR | Status: DC

## 2020-08-29 MED ORDER — POTASSIUM CHLORIDE 20 MEQ PO PACK
40.0000 meq | PACK | Freq: Once | ORAL | Status: AC
  Administered 2020-08-29: 40 meq
  Filled 2020-08-29: qty 2

## 2020-08-29 MED ORDER — POTASSIUM CHLORIDE 20 MEQ PO PACK: 40.0000 meq | PACK | Freq: Once | ORAL | Status: DC

## 2020-08-29 MED ORDER — MAGNESIUM SULFATE 2 GM/50ML IV SOLN
2.0000 g | Freq: Once | INTRAVENOUS | Status: AC
  Administered 2020-08-29: 2 g via INTRAVENOUS
  Filled 2020-08-29: qty 50

## 2020-08-29 MED ORDER — PROSOURCE TF PO LIQD
45.0000 mL | Freq: Two times a day (BID) | ORAL | Status: DC
  Administered 2020-08-29 – 2020-08-31 (×5): 45 mL
  Filled 2020-08-29 (×5): qty 45

## 2020-08-29 MED ORDER — NOREPINEPHRINE 4 MG/250ML-% IV SOLN
INTRAVENOUS | Status: AC
  Administered 2020-08-29: 2 ug/min via INTRAVENOUS
  Filled 2020-08-29: qty 250

## 2020-08-29 MED ORDER — IPRATROPIUM-ALBUTEROL 0.5-2.5 (3) MG/3ML IN SOLN
3.0000 mL | Freq: Four times a day (QID) | RESPIRATORY_TRACT | Status: DC | PRN
  Administered 2020-08-29 – 2020-08-31 (×5): 3 mL via RESPIRATORY_TRACT
  Filled 2020-08-29 (×4): qty 3

## 2020-08-29 NOTE — Progress Notes (Signed)
E-Link contacted, Patient needing to be placed on Pressor, Map in low 50's high 40's. Current BP 85/38.  Awaiting orders.

## 2020-08-29 NOTE — Progress Notes (Signed)
Critical Lab Calcium 6.3, E-link nurse notified at this time. Awaiting orders.

## 2020-08-29 NOTE — Progress Notes (Signed)
[facility] Progress Note Patient Name: [name] DOB: 17-Aug-1965 MRN: [id number]   Date of Service  08/29/2020  HPI/Events of Note  Notified of hypotension with MAP 50 via art line.  Pt is intubated and sedated.  She has COVID-19, CHF exacerbation, paroxysmal atrial fibrillation.  eICU Interventions  Start on levophed.     Intervention Category Intermediate Interventions: Hypotension - evaluation and management  [name] 08/29/2020, 8:59 PM

## 2020-08-29 NOTE — Progress Notes (Addendum)
END OF SHIFT NOTE  Patient continues with intubation and sedation near end of shift.   Neuro: Rass -3. Patient does withdrawal to pain, will open eyes to pain, will fight against restraints when doing oral care. Will attempt to pull at tube when awake.   Cardiac: patient continues with irregular rhythm. LBBB and 1st degree AV block. HR maintained in upper 50's lower 60's. SBP stable during shift.   Resp: patient continues on PRVC with Fio2 30%, Peep 5, RR 28, TV 420 with ETT 25 @ Lips. Lung sounds clear and diminished.   GI/GU: Patient continues with foley at this time. 500 ml output total during night shift. No BM noted during night shift. Patient remains NPO  Skin: Skin remains intact, soft wrist restraints remain in place.   Labs: calcium replaced during shift. Last lab calcium 6.3. Continue to monitor. K+ 3.4 however pt is HD patient.   Gtts: continue per [facility]  Safety measure remain in place, Will continue to monitor at this time and SBARR to day shift nurse at change of shift.

## 2020-08-29 NOTE — Progress Notes (Signed)
[facility] ROUNDING NOTE   Subjective:    had 1.5 liters UOP over 2/21. Has been on no pressors and on sedation.  Remains intubated  Review of systems:  Unable to obtain 2/2 intubated and sedated  ------------------ Brief History:  55 year old lady with a history of renal transplant cadaveric 2011 appears like a baseline creatinine about 2.5 mg/dL based on notes from office.  However during her hospitalizations in January it looks like her creatinine has gradually been worsening.  On 08/06/2020 it did increase to 3.66 mg/dL. she has a history of hypertension and congestive heart failure with a diminished ejection fraction of about 30%.  Atrial fibrillation and thrombophilia with an antithrombin III deficiency she presented 08/30/2020 with shortness of breath.  She was diagnosed with COVID-19 pneumonia 08/20/2020.  She has been admitted for Decadron and close monitoring. she has been evaluated by hematology and plans will be for a bone marrow biopsy as an inpatient.  Most recent hospitalizations #1 07/04/2020  -01/06/6377 complications of acute on chronic heart failure with a creatinine that had increased during hospitalization. Most recent hospitalizations #2   08/01/2020 -08/06/2020 with worsening anemia.   Objective:  Vital signs in last 24 hours:  Temp:  [94.4 F (34.7 C)-97.9 F (36.6 C)] 97.9 F (36.6 C) (02/22 0346) Pulse Rate:  [50-114] 60 (02/22 0719) Resp:  [0-28] 28 (02/22 0719) BP: (112-151)/(46-66) 136/56 (02/22 0719) SpO2:  [89 %-100 %] 95 % (02/22 0719) Arterial Line BP: (93-173)/(44-75) 118/52 (02/22 0700) FiO2 (%):  [30 %-40 %] 30 % (02/22 0719) Weight:  [116.4 kg-117.4 kg] 117.4 kg (02/22 0410)  Weight change: 0 kg Filed Weights   08/28/20 0500 08/28/20 1800 08/29/20 0410  Weight: 116.4 kg 116.4 kg 117.4 kg    Intake/Output: I/O last 3 completed shifts: In: 2232.8 [I.V.:1416.9; Blood:485.5; IV Piggyback:330.4] Out: 2400 [Urine:2400]    Intake/Output this shift:  No intake/output data recorded.  General adult female in bed critically ill   HEENT normocephalic atraumatic  Neck normal circumference trachea midline Lungs clear but reduced on resp auscultation; FI02 30 and PEEP 5 Heart S1S2 no rub Abdomen soft nontender nondistended Extremities 1+ edema  Neuro sedation continuously running; opens eyes to exam   Basic Metabolic Panel: Recent Labs  Lab 08/24/20 0121 08/25/20 0150 08/26/20 0245 08/27/20 0223 08/27/20 5885 08/27/20 2018 08/27/20 2212 08/27/20 2357 08/28/20 0445 08/28/20 2041 08/29/20 0415  NA 142 142 141  --  142   < > 138 142 142 141 139  K 4.1 4.2 4.0  --  3.9   < > 3.0* 3.0* 3.1* 3.7 3.4*  CL 107 107 105  --  103  --  102  --  106 104 103  CO2 21* 21* 23  --  21*  --  18*  --  20* 20* 20*  GLUCOSE 189* 224* 203*  --  179*  --  178*  --  132* 139* 158*  BUN 89* 90* 87*  --  90*  --  92*  --  92* 93* 94*  CREATININE 4.01* 4.06* 3.84*  --  3.95*  --  3.88*  --  3.94* 3.82* 3.89*  CALCIUM 5.5* 5.6* 5.7*  --  6.2*  --  5.9*  --  6.0* 6.3* 6.3*  MG 1.6* 1.8 1.6* 1.6*  --   --  1.6*  --   --   --   --   PHOS 5.7* 5.2* 5.1* 5.1* 5.2*  --   --   --   --   --   --    < > =  values in this interval not displayed.    Liver Function Tests: Recent Labs  Lab 08/30/2020 0915 08/23/20 0250 08/24/20 0121 08/25/20 0150 08/27/20 0608  AST _0 --   ALT _1 --   ALKPHOS 468* 489* 398* 403*  --   BILITOT 0.2* 0.5 0.5 0.5  --   PROT 5.4* 5.5* 4.8* 5.3*  --   ALBUMIN 3.1* 3.1* 2.8* 3.0* 3.1*   CBC: Recent Labs  Lab 08/17/2020 0915 08/14/2020 2040 08/23/20 0250 08/24/20 0121 08/25/20 1158 08/26/20 0245 08/27/20 6962 08/27/20 2018 08/27/20 2212 08/27/20 2357 08/28/20 0445 08/28/20 1121 08/28/20 2041 08/29/20 0415  WBC 2.3*  --  2.4*   < > 2.8* 3.4* 3.3*  --  5.3  --  3.7*  --   --  2.2*  NEUTROABS 1.9  --  2.0  --  2.2  --   --   --  4.5  --   --   --   --   --   HGB 6.1*   < >  7.8*   < > 7.8* 7.3* 7.8*   < > 7.0* 6.8* 6.1* 7.2* 8.0* 7.7*  HCT 19.4*   < > 23.7*   < > 24.8* 22.2* 24.6*   < > 23.0* 20.0* 20.5* 22.8* 25.0* 23.0*  MCV 95.6  --  92.6   < > 94.7 92.9 94.3  --  96.2  --  97.2  --   --  88.5  PLT 81*  --  89*   < > 109* 106* 134*  --  138*  --  108*  --   --  125*   < > = values in this interval not displayed.    CBG: Recent Labs  Lab 08/28/20 0318 08/28/20 0811 08/28/20 1115 08/28/20 1538 08/28/20 2212  GLUCAP 134* 105* 96 90 132*    Microbiology: Results for orders placed or performed during the hospital encounter of 08/21/2020  Resp Panel by RT-PCR (Flu A&B, Covid) Nasopharyngeal Swab     Status: Abnormal   Collection Time: 08/14/2020  9:51 AM   Specimen: Nasopharyngeal Swab; Nasopharyngeal(NP) swabs in vial transport medium  Result Value Ref Range Status   SARS Coronavirus 2 by RT PCR POSITIVE (A) NEGATIVE Final    Comment: RESULT CALLED TO, READ BACK BY AND VERIFIED WITH: RN MISSY E. [id number] (NOTE) SARS-CoV-2 target nucleic acids are DETECTED.  The SARS-CoV-2 RNA is generally detectable in upper respiratory specimens during the acute phase of infection. Positive results are indicative of the presence of the identified virus, but do not rule out bacterial infection or co-infection with other pathogens not detected by the test. Clinical correlation with patient history and other diagnostic information is necessary to determine patient infection status. The expected result is Negative.  Fact Sheet for Patients: EntrepreneurPulse.com.au  Fact Sheet for Healthcare Providers: IncredibleEmployment.be  This test is not yet approved or cleared by the [place] FDA and  has been authorized for detection and/or diagnosis of SARS-CoV-2 by FDA under an Emergency Use Authorization (EUA).  This EUA will remain in effect (meaning this test can be used) for  the duration of  the COVID-19 declaration under  Section 564(b)(1) of the Act, 21 U.S.C. section 360bbb-3(b)(1), unless the authorization is terminated or revoked sooner.     Influenza A by PCR NEGATIVE NEGATIVE Final   Influenza B by PCR NEGATIVE NEGATIVE Final    Comment: (NOTE) The Xpert Xpress SARS-CoV-2/FLU/RSV plus  assay is intended as an aid in the diagnosis of influenza from Nasopharyngeal swab specimens and should not be used as a sole basis for treatment. Nasal washings and aspirates are unacceptable for Xpert Xpress SARS-CoV-2/FLU/RSV testing.  Fact Sheet for Patients: EntrepreneurPulse.com.au  Fact Sheet for Healthcare Providers: IncredibleEmployment.be  This test is not yet approved or cleared by the [place] FDA and has been authorized for detection and/or diagnosis of SARS-CoV-2 by FDA under an Emergency Use Authorization (EUA). This EUA will remain in effect (meaning this test can be used) for the duration of the COVID-19 declaration under Section 564(b)(1) of the Act, 21 U.S.C. section 360bbb-3(b)(1), unless the authorization is terminated or revoked.  Performed at [facility], [place] [address] [place], [place] [address]   MRSA PCR Screening     Status: None   Collection Time: 08/31/2020 11:16 PM   Specimen: Nasal Mucosa; Nasopharyngeal  Result Value Ref Range Status   MRSA by PCR NEGATIVE NEGATIVE Final    Comment:        The GeneXpert MRSA Assay (FDA approved for NASAL specimens only), is one component of a comprehensive MRSA colonization surveillance program. It is not intended to diagnose MRSA infection nor to guide or monitor treatment for MRSA infections. Performed at [facility], [place] [address], [place], [place] [address]   Culture, blood (routine x 2)     Status: None (Preliminary result)   Collection Time: 08/27/20 10:54 PM   Specimen: BLOOD RIGHT HAND  Result Value Ref Range Status   Specimen Description BLOOD RIGHT HAND  Final    Special Requests   Final    BOTTLES DRAWN AEROBIC ONLY Blood Culture results may not be optimal due to an inadequate volume of blood received in culture bottles   Culture   Final    NO GROWTH 2 DAYS Performed at [facility], [place] [address], [place], [place] [address]    Report Status PENDING  Incomplete  Culture, blood (routine x 2)     Status: None (Preliminary result)   Collection Time: 08/27/20 10:58 PM   Specimen: BLOOD RIGHT ARM  Result Value Ref Range Status   Specimen Description BLOOD RIGHT ARM  Final   Special Requests   Final    BOTTLES DRAWN AEROBIC ONLY Blood Culture results may not be optimal due to an inadequate volume of blood received in culture bottles   Culture   Final    NO GROWTH 2 DAYS Performed at [facility], [place] [address], [place], [place] [address]    Report Status PENDING  Incomplete  Culture, Respiratory w Gram Stain     Status: None (Preliminary result)   Collection Time: 08/28/20  1:57 AM   Specimen: Tracheal Aspirate; Respiratory  Result Value Ref Range Status   Specimen Description TRACHEAL ASPIRATE  Final   Special Requests NONE  Final   Gram Stain   Final    FEW WBC PRESENT,BOTH PMN AND MONONUCLEAR FEW GRAM NEGATIVE RODS RARE GRAM POSITIVE COCCI IN CHAINS Performed at [facility], [place] [address] [place], [place] [address]    Culture PENDING  Incomplete   Report Status PENDING  Incomplete    Coagulation Studies: Recent Labs    08/27/20 26-Aug-2210 08/28/20 0445 08/29/20 0415  LABPROT 18.8* 18.7* 16.7*  INR 1.6* 1.6* 1.4*    Urinalysis: No results for input(s): COLORURINE, LABSPEC, PHURINE, GLUCOSEU, HGBUR, BILIRUBINUR, KETONESUR, PROTEINUR, UROBILINOGEN, NITRITE, LEUKOCYTESUR in the last 72  hours.  Invalid input(s): APPERANCEUR    Imaging: DG Abd 1 View  Result Date: 08/27/2020 CLINICAL DATA:  Tube placement EXAM: ABDOMEN - 1 VIEW COMPARISON:  AUB 02/21/2007 FINDINGS: OG tube tip projects over the gastric  antrum/pylorus. The tip is pointed distally. The visualized bowel gas pattern is nonspecific. IMPRESSION: OG tube tip projects over the gastric antrum/pylorus. Electronically Signed   By: Constance Holster M.D.   On: 08/27/2020 20:07   DG CHEST PORT 1 VIEW  Result Date: 08/28/2020 CLINICAL DATA:  Central venous catheter in place. EXAM: PORTABLE CHEST 1 VIEW COMPARISON:  08/27/2020 FINDINGS: Endotracheal tube terminates 2.7 cm above the carina. Left jugular catheter is unchanged with tip in the expected region of the confluence of the brachiocephalic veins. Enteric tube courses into the abdomen with tip not imaged. The cardiac silhouette is largely obscured. Widespread bilateral airspace opacities are unchanged on the left and may have mildly progressed in the right lung base. There is a small right pleural effusion. No pneumothorax is identified. IMPRESSION: 1. Support devices as above. 2. Extensive bilateral airspace disease with possible mild progression in the right lung base. Electronically Signed   By: Logan Bores M.D.   On: 08/28/2020 16:27   DG CHEST PORT 1 VIEW  Result Date: 08/27/2020 CLINICAL DATA:  Central line placement EXAM: PORTABLE CHEST 1 VIEW COMPARISON:  August 27, 2020 FINDINGS: A left central line is been placed in the interval. The distal tip crosses midline to the right with the tip directed slightly superiorly. I suspect the distal tip is near the confluence of the right subclavian vein and brachiocephalic vein. No pneumothorax. Diffuse bilateral pulmonary opacities are similar in the interval. Stable cardiomediastinal silhouette. No pneumothorax. The ETT is in good position. The NG tube terminates below today's film. IMPRESSION: 1. The left central line crosses midline with the tip directed slightly in a cranial direction suggesting the tip may be near the confluency of the right subclavian vein and brachiocephalic vein. Recommend repositioning before use. 2. Other support  apparatus as above. 3. Continued bilateral pulmonary infiltrates. These results will be called to the ordering clinician or representative by the Radiologist Assistant, and communication documented in the PACS or [employer]. Electronically Signed   By: Dorise Bullion III M.D   On: 08/27/2020 20:49   DG CHEST PORT 1 VIEW  Result Date: 08/27/2020 CLINICAL DATA:  Tube placement EXAM: PORTABLE CHEST 1 VIEW COMPARISON:  02/30/2022 FINDINGS: The endotracheal tube terminates above the carina. The OG tube terminates below the left hemidiaphragm. Diffuse bilateral hazy airspace opacities are noted. There is no pneumothorax. There may be a small right-sided pleural effusion. The heart size is stable. There is no acute osseous abnormality. IMPRESSION: 1. Endotracheal tube terminates above the carina. 2. OG tube terminates below the left hemidiaphragm. 3. Diffuse bilateral hazy airspace opacities. Electronically Signed   By: Constance Holster M.D.   On: 08/27/2020 20:07   DG CHEST PORT 1 VIEW  Result Date: 08/27/2020 CLINICAL DATA:  COVID positive with acute respiratory failure. EXAM: PORTABLE CHEST 1 VIEW COMPARISON:  August 22, 2020 FINDINGS: Moderate to marked severity bilateral multifocal infiltrates are seen. There is no evidence of a pleural effusion or pneumothorax. The cardiac silhouette is markedly enlarged. The visualized skeletal structures are unremarkable. IMPRESSION: Moderate to marked severity bilateral multifocal infiltrates. Electronically Signed   By: Virgina Norfolk M.D.   On: 08/27/2020 18:58   VAS [place] LOWER EXTREMITY VENOUS (DVT)  Result Date: 08/28/2020  Lower  Venous DVT Study Indications: Swelling, Covid-19.  Limitations: Poor ultrasound/tissue interface. Comparison Study: 03-16-2020 Prior bilateral lower extremity venous study was                   negative for DVT. Performing Technologist: Darlin Coco RDMS  Examination Guidelines: A complete evaluation includes B-mode imaging,  spectral Doppler, color Doppler, and power Doppler as needed of all accessible portions of each vessel. Bilateral testing is considered an integral part of a complete examination. Limited examinations for reoccurring indications may be performed as noted. The reflux portion of the exam is performed with the patient in reverse Trendelenburg.  +---------+---------------+---------+-----------+----------+--------------+ RIGHT    CompressibilityPhasicitySpontaneityPropertiesThrombus Aging +---------+---------------+---------+-----------+----------+--------------+ CFV      Full           Yes      Yes                                 +---------+---------------+---------+-----------+----------+--------------+ SFJ      Full                                                        +---------+---------------+---------+-----------+----------+--------------+ FV Prox  Full                                                        +---------+---------------+---------+-----------+----------+--------------+ FV Mid   Full                                                        +---------+---------------+---------+-----------+----------+--------------+ FV DistalFull                                                        +---------+---------------+---------+-----------+----------+--------------+ PFV      Full                                                        +---------+---------------+---------+-----------+----------+--------------+ POP      Full           Yes      Yes                                 +---------+---------------+---------+-----------+----------+--------------+ PTV      Full                                                        +---------+---------------+---------+-----------+----------+--------------+ PERO  Full                                                        +---------+---------------+---------+-----------+----------+--------------+    +---------+---------------+---------+-----------+----------+--------------+ LEFT     CompressibilityPhasicitySpontaneityPropertiesThrombus Aging +---------+---------------+---------+-----------+----------+--------------+ CFV      Full           Yes      Yes                                 +---------+---------------+---------+-----------+----------+--------------+ SFJ      Full                                                        +---------+---------------+---------+-----------+----------+--------------+ FV Prox  Full                                                        +---------+---------------+---------+-----------+----------+--------------+ FV Mid   Full                                                        +---------+---------------+---------+-----------+----------+--------------+ FV DistalFull                                                        +---------+---------------+---------+-----------+----------+--------------+ PFV      Full                                                        +---------+---------------+---------+-----------+----------+--------------+ POP      Full           Yes      Yes                                 +---------+---------------+---------+-----------+----------+--------------+ PTV      Full                                                        +---------+---------------+---------+-----------+----------+--------------+ PERO     Full                                                        +---------+---------------+---------+-----------+----------+--------------+  Summary: RIGHT: - There is no evidence of deep vein thrombosis in the lower extremity.  - No cystic structure found in the popliteal fossa.  LEFT: - There is no evidence of deep vein thrombosis in the lower extremity.  - No cystic structure found in the popliteal fossa.  *See table(s) above for measurements and observations. Electronically signed  by Ruta Hinds MD on 08/28/2020 at 5:49:21 PM.    Final    ECHOCARDIOGRAM LIMITED  Result Date: 08/28/2020    ECHOCARDIOGRAM LIMITED REPORT   Patient Name:   [name] Date of Exam: 08/28/2020 Medical Rec #:  [id number]       Height:       71.0 in Accession #:    [id number]      Weight:       256.6 lb Date of Birth:  10/06/1965       BSA:          2.344 m Patient Age:    72 years        BP:           209/100 mmHg Patient Gender: F               HR:           53 bpm. Exam Location:  Inpatient Procedure: Limited Echo, Limited Color Doppler and Cardiac Doppler STAT ECHO Indications:    acute respiratory distress  History:        Patient has prior history of Echocardiogram examinations, most                 recent 07/05/2020. Covid; Arrythmias:Paroxymal A-fib.  Sonographer:    Johny Chess Referring Phys: [id number] ALEXANDER N RAINES IMPRESSIONS  1. Left ventricular ejection fraction, by estimation, is 35 to 40%. The left ventricle has moderately decreased function. The left ventricle demonstrates global hypokinesis. The left ventricular internal cavity size was severely dilated. There is severe  concentric left ventricular hypertrophy. Left ventricular diastolic parameters are consistent with Grade II diastolic dysfunction (pseudonormalization).  2. Right ventricular systolic function is normal. There is mildly elevated pulmonary artery systolic pressure. The estimated right ventricular systolic pressure is 16.0 mmHg.  3. A small pericardial effusion is present. The pericardial effusion is lateral to the left ventricle.  4. The mitral valve is grossly normal. Trivial mitral valve regurgitation.  5. There is moderate calcification of the aortic valve. Aortic valve regurgitation is moderate.  6. The inferior vena cava is normal in size with greater than 50% respiratory variability, suggesting right atrial pressure of 3 mmHg. Comparison(s): No prior Echocardiogram. Conclusion(s)/Recommendation(s): There is  new reduced ejection fraction with calcified aortic valve, ventricular dilation, and aortic regurgitation. Would consider evaluation for bicuspid aortic valve (CCTA vs TEE as a future study). FINDINGS  Left Ventricle: LVEF 46% by Quinones equation, 45% by Dolores Patty. Left ventricular ejection fraction, by estimation, is 35 to 40%. The left ventricle has moderately decreased function. The left ventricle demonstrates global hypokinesis. The left ventricular internal cavity size was severely dilated. There is severe concentric left ventricular hypertrophy. Left ventricular diastolic parameters are consistent with Grade II diastolic dysfunction (pseudonormalization). Right Ventricle: Right ventricular systolic function is normal. There is mildly elevated pulmonary artery systolic pressure. The tricuspid regurgitant velocity is 2.65 m/s, and with an assumed right atrial pressure of 8 mmHg, the estimated right ventricular systolic pressure is 73.7 mmHg. Pericardium: A small pericardial effusion is present. The pericardial effusion is lateral to the left ventricle. Mitral  Valve: The mitral valve is grossly normal. Mild mitral annular calcification. Trivial mitral valve regurgitation. Tricuspid Valve: The tricuspid valve is grossly normal. Tricuspid valve regurgitation is mild. Aortic Valve: There is moderate calcification of the aortic valve. Aortic valve regurgitation is moderate. Aortic regurgitation PHT measures 531 msec. Pulmonic Valve: The pulmonic valve was grossly normal. Pulmonic valve regurgitation is mild. Aorta: The aortic root and ascending aorta are structurally normal, with no evidence of dilitation. Venous: The inferior vena cava is normal in size with greater than 50% respiratory variability, suggesting right atrial pressure of 3 mmHg. LEFT VENTRICLE PLAX 2D LVIDd:         6.40 cm  Diastology LVIDs:         5.10 cm  LV e' medial:    4.35 cm/s LV PW:         1.70 cm  LV E/e' medial:  18.3 LV IVS:         1.70 cm  LV e' lateral:   5.22 cm/s LVOT diam:     2.30 cm  LV E/e' lateral: 15.2 LV SV:         108 LV SV Index:   46 LVOT Area:     4.15 cm  RIGHT VENTRICLE             IVC RV S prime:     10.40 cm/s  IVC diam: 1.90 cm LEFT ATRIUM         Index LA diam:    5.70 cm 2.43 cm/m  AORTIC VALVE LVOT Vmax:   108.00 cm/s LVOT Vmean:  66.700 cm/s LVOT VTI:    0.259 m AI PHT:      531 msec  AORTA Ao Root diam: 3.10 cm Ao Asc diam:  3.60 cm MITRAL VALVE               TRICUSPID VALVE MV Area (PHT): 2.14 cm    TR Peak grad:   28.1 mmHg MV Decel Time: 354 msec    TR Vmax:        265.00 cm/s MV E velocity: 79.60 cm/s MV A velocity: 52.00 cm/s  SHUNTS MV E/A ratio:  1.53        Systemic VTI:  0.26 m                            Systemic Diam: 2.30 cm Rudean Haskell MD Electronically signed by Rudean Haskell MD Signature Date/Time: 08/28/2020/10:30:29 AM    Final      Medications:   . fentaNYL infusion INTRAVENOUS 50 mcg/hr (08/29/20 0820)  . heparin 800 Units/hr (08/29/20 0600)  . propofol (DIPRIVAN) infusion 40 mcg/kg/min (08/29/20 0815)   . amiodarone  200 mg Per Tube Daily  . atorvastatin  40 mg Per Tube Daily  . calcitRIOL  4 mcg Per Tube BID  . calcium-vitamin D  1 tablet Per Tube TID  . chlorhexidine gluconate (MEDLINE KIT)  15 mL Mouth Rinse BID  . Chlorhexidine Gluconate Cloth  6 each Topical Daily  . Chlorhexidine Gluconate Cloth  6 each Topical Daily  . Darbepoetin Alfa  60 mcg Subcutaneous Q Sat-1800  . dexamethasone  6 mg Per Tube Q24H  . docusate  100 mg Per Tube BID  . insulin aspart  0-9 Units Subcutaneous Q4H  . isosorbide-hydrALAZINE  1.5 tablet Per Tube TID  . mouth rinse  15 mL Mouth Rinse 10 times per day  . pantoprazole sodium  40 mg Per Tube Daily  . polyethylene glycol  17 g Per Tube Daily  . sevelamer carbonate  800 mg Per Tube TID WC  . sodium chloride flush  10-40 mL Intracatheter Q12H  . tacrolimus  5 mg Per Tube BID  . vitamin B-12  1,000 mcg Per Tube Daily    fentaNYL, hydrocortisone cream, ipratropium-albuterol, senna, sodium chloride flush, witch hazel-glycerin  Assessment/ Plan:   Acute kidney injury.    Baseline creatinine widely fluctuating between 2.5 and 3.5.  Renal ultrasound performed 07/06/2020 was essentially unremarkable.  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  It is of note that she has been diagnosed with worsening heart failure.  Recent worsening of kidney dysfunction could be related to Covid as well as possible cardiorenal syndrome.    Continue supportive care  Lasix 60 mg IV once today  Anemia CKD.  Now appears to have some pancytopenia.  Hematology was called - per charting not seen.  Bone marrow biopsy performed on 2/18.  Continue ESA therapy. would recommend transfusion as needed. Increase aranesp 100 mcg every sat  Immunosuppression in the face of Covid pneumonia severe infection and pancytopenia.    Holding CellCept at this time. Continue Prograf and steroids Prograf dose 5 mg twice daily. Can restart cellcept as she improves  Covid pneumonia as per primary service  Hypertension -controlled  Hypocalcemia continue vitamin D and calcium. On calcitriol. Note this was increased and may need to be decreased pending her course.  Continue IV repletion as ordered  Hypokalemia, hypomag - repleted this AM; ordered mag   Claudia Desanctis, MD 08/29/2020 8:55 AM

## 2020-08-29 NOTE — Progress Notes (Signed)
ANTICOAGULATION CONSULT NOTE - Follow Up Consult  Pharmacy Consult for heparin Indication: h/o VTE and PAF  Labs: Recent Labs    08/27/20 2212 08/27/20 2357 08/28/20 0445 08/28/20 1121 08/28/20 1738 08/28/20 2041 08/29/20 0400 08/29/20 0415 08/29/20 1300  HGB 7.0*   < > 6.1* 7.2*  --  8.0*  --  7.7*  --   HCT 23.0*   < > 20.5* 22.8*  --  25.0*  --  23.0*  --   PLT 138*  --  108*  --   --   --   --  125*  --   APTT  --    < > >200*  --  164*  --  126*  --  130*  LABPROT 18.8*  --  18.7*  --   --   --   --  16.7*  --   INR 1.6*  --  1.6*  --   --   --   --  1.4*  --   HEPARINUNFRC  --   --  1.98*  --   --   --  1.04*  --   --   CREATININE 3.88*  --  3.94*  --   --  3.82*  --  3.89*  --   TROPONINIHS 237*  --  270*  --   --   --   --   --   --    < > = values in this interval not displayed.    Assessment: 55 yo female on heparin infusion who takes Eliquis 5mg  BID PTA for afib. 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 Her last dose of Eliquis was given on 2/20 @0802 . Will continue to monitor with aPTT given recent Eliquis administration.   Pt was admitted with symptomatic anemia and pancytopenia.    aPTT 130 on 800 units/hr is persistently supratherapeutic. CBC: Hgb 7.7 Plt 125. No reported bleeding per RN this afternoon.   Goal of Therapy:  aPTT 66-102 seconds   Plan:  Hold heparin infusion for 1 hr  Restart heparin after 1 hour at 600 units/hr at 1600 Check aPTT at 2200  Monitor daily aPTT, heparin level, CBC and s/sx of bleeding daily   Wilson Singer, PharmD PGY1 Pharmacy Resident 08/29/2020 2:47 PM

## 2020-08-29 NOTE — Progress Notes (Signed)
ANTICOAGULATION CONSULT NOTE - Follow Up Consult  Pharmacy Consult for heparin Indication: h/o VTE and PAF  Labs: Recent Labs    08/27/20 2212 08/27/20 2357 08/28/20 0445 08/28/20 1121 08/28/20 1738 08/28/20 2041 08/29/20 0400 08/29/20 0415  HGB 7.0*   < > 6.1* 7.2*  --  8.0*  --  7.7*  HCT 23.0*   < > 20.5* 22.8*  --  25.0*  --  23.0*  PLT 138*  --  108*  --   --   --   --  125*  APTT  --   --  >200*  --  164*  --  126*  --   LABPROT 18.8*  --  18.7*  --   --   --   --   --   INR 1.6*  --  1.6*  --   --   --   --   --   HEPARINUNFRC  --   --  1.98*  --   --   --  1.04*  --   CREATININE 3.88*  --  3.94*  --   --  3.82*  --  3.89*  TROPONINIHS 237*  --  270*  --   --   --   --   --    < > = values in this interval not displayed.    Assessment: 55yo female on heparin for afib while eliquis on hold.  APTT remains above goal this am.  No bleeding reported  Goal of Therapy:  aPTT 66-102 seconds   Plan:  Decrease heparin to 800 units/hr Check aPTT in 6-8 hours Monitor aPTT, heparin level, CBC and S/S of bleeding daily  Thanks for allowing pharmacy to be a part of this patient's care.  Excell Seltzer, PharmD Clinical Pharmacist   08/29/2020,5:35 AM

## 2020-08-29 NOTE — Progress Notes (Signed)
[facility] Progress Note Patient Name: [name] DOB: 1965-10-09 MRN: [id number]   Date of Service  08/29/2020  HPI/Events of Note  Hypocalcemia - Ca++ = 6.3 which corrects to 7.02 (Low) given albumin = 3.1. Phosphorus = 5.2 and Creatinine = 3.89.   eICU Interventions  Will replace Ca++.     Intervention Category Major Interventions: Electrolyte abnormality - evaluation and management  [name] Cornelia Copa 08/29/2020, 5:44 AM

## 2020-08-29 NOTE — Progress Notes (Signed)
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  Had bone marrow biopsy 2/18.  Had rapid deterioration on the floor today, 2/20, where she had increasing oxygen requirements, and severe agitation (was hypertensive) requiring transfer to ICU.   History of Present Illness:  Patient was initially admitted for pancytopenia on 2/15 with a hemoglobin of 6.1, hypocalcemia, in the setting of COVID-19.  For her pancytopenia, she had a bone marrow biopsy on 2/18, but this has been thought to be related to chronic disease. She has been diuresed, with fluctuating creatinine (nephro on board) but good urine output.  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   She is also getting decadron $RemoveBeforeDE'6mg'mgpDPxfRUyVrDEV$  for COVID-19 pneumonia.  For her hypocalcemia, due to her CKD, she is getting vitamin D repletion, calcitriol, and as needed IV calcium replacement   She was initially on 4L of O2 for her stay until this morning, when she had increasing O2 requirements and agitated delirium.  Also noted to be hypertensive during this episode and had an episode of hemoptysis along with increasing O2 requirements.    She was very agitated on arrival to ICU on 2/20, was on nonrebreather mask, and could not get an accurate pulse ox.  ABG showed respiratory acidosis (pre-intubation).  Patient was urgently intubated due to respiratory failure.     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 Remains off cellcept. Mental status remains poor 2/21: remains sedated, renal insufficiency cont to worsen by indices but uop good. . hgb remains low and platelets cont to fall. Echo without rhs and stable from 12/21, le dopplers pending with RLE >>LLE  In size. On minimal vent settings, pt moves but does not follow commands. Transfusing one unit blood for hgb 6.1. no overt bleeding noted.   Objective   Blood pressure (!) 136/56, pulse 60, temperature 97.9 F (36.6 C), temperature source Oral, resp. rate (!) 28, height $RemoveBe'5\' 11"'gQhSnxvfl$  (1.803 m), weight 117.4 kg, last menstrual period 11/05/2012, SpO2 95 %.    Vent Mode: PRVC FiO2 (%):  [30 %-40 %] 30 % Set Rate:  [28 bmp] 28 bmp Vt Set:  [420 mL] 420 mL PEEP:  [5 cmH20] 5 cmH20 Plateau Pressure:  [15 cmH20-17 cmH20] 17 cmH20   Intake/Output Summary (Last 24 hours) at 08/29/2020 0910 Last data filed at 08/29/2020 0600 Gross per 24 hour  Intake 1547.75 ml  Output 1100 ml  Net 447.75 ml   Filed Weights   08/28/20 0500 08/28/20 1800  08/29/20 0410  Weight: 116.4 kg 116.4 kg 117.4 kg    Examination: General:  Sedated unresponsive but spontaneously moving all 4 HENT: NCAT, eyes, equal and round, mmmp Lungs: rhonchi Cardiovascular: regular rate and rhythm Abdomen: obese soft, nontender, nondistended Extremities: pitting edema b/l R >>L le Neuro: sedated, unresponsive to verbal stim but restless   Resolved Hospital Problem list     Assessment & Plan:   55 yo F with  ATIII deficiency, Afib, 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  Had acute decompensation on 2/20 with increasing O2 requirements, agitation, and acute hypoxic and hypercarbic respiratory failure requiring intubation.    # Acute hypoxic and hypercarbic respiratory failure # COVID-19 # CHF exacerbation intubated 2/20 -possible covid worsening  vs pulm edema  - also PE is of consideration, her AC had been pasued for the BM biopsy ( still pending), but otherwise she has been on anticoagulation.  Will hold on a CTA PE for now in light of her poor kidney function, but will consider if does not improve with diuresis. (less likely in setting of stable echo without rhs, and negative le venous doppler) Plan: - ventilation with goal of 6cc/kg - diuresis with goal at least 1-2 L net negative - continue decadron $RemoveBeforeDEI'6mg'GgDOpSQBmdmONeDp$  daily for COVID-19 treatment - troponin and EKG - holding eliquis--> heparin gtt - le venous doppler negative    # Acute encephalopathy: Agitated delirium now resolved s/p intubation and sedation.  Agitation likely precipitated by worsening hypoxia on the floor.  - daily spontaneous awaking trials  # Pancytopenia: s/p bone marrow biopsy 2/18.  Likely from chronic illness, and advanced renal disease - follow up BM bx results - trend CBC - no need for trasnfusion at this time  # CKD stage V # Hypocalcemia Currently has good UOP with diuresis. Has been given lasix $RemoveBef'60mg'RNLuSaIEDU$  today. Nephrology on board - appreciate nephrology recs - BMP now  # Hypertension: currenly on bidil, amiodarone, and toprol XL - holding metoprolol while getting sedation.  Can restart if BP tolerates - continue bidil and amiodarone for now  # pAFib: in sinus now - continue amiodarone  # AT III deficiency # RUE DVT - continue anticoagulation.  -cont heparin  Best  practice (evaluated daily)  Diet: NPO Pain/Anxiety/Delirium protocol (if indicated): yes VAP protocol (if indicated): yes DVT prophylaxis: heparin gtt GI prophylaxis: ppi  Glucose control: SSI q4H Mobility: n/a Disposition:ICU  Goals of Care:  Last date of multidisciplinary goals of care discussion: family aware of transfer to ICU Family and staff present: no Summary of discussion: Follow up goals of care discussion due:  Code Status: Full  Labs   CBC: Recent Labs  Lab 08/26/2020 0915 09/02/2020 2040 08/23/20 0250 08/24/20 0121 08/25/20 1158 08/26/20 0245 08/27/20 4665 08/27/20 2018 08/27/20 2212 08/27/20 2357 08/28/20 0445 08/28/20 1121 08/28/20 2041 08/29/20 0415  WBC 2.3*  --  2.4*   < > 2.8* 3.4* 3.3*  --  5.3  --  3.7*  --   --  2.2*  NEUTROABS 1.9  --  2.0  --  2.2  --   --   --  4.5  --   --   --   --   --   HGB 6.1*   < > 7.8*   < > 7.8* 7.3* 7.8*   < > 7.0* 6.8* 6.1* 7.2* 8.0* 7.7*  HCT  19.4*   < > 23.7*   < > 24.8* 22.2* 24.6*   < > 23.0* 20.0* 20.5* 22.8* 25.0* 23.0*  MCV 95.6  --  92.6   < > 94.7 92.9 94.3  --  96.2  --  97.2  --   --  88.5  PLT 81*  --  89*   < > 109* 106* 134*  --  138*  --  108*  --   --  125*   < > = values in this interval not displayed.    Basic Metabolic Panel: Recent Labs  Lab 08/24/20 0121 08/25/20 0150 08/26/20 0245 08/27/20 0223 08/27/20 0300 08/27/20 2018 08/27/20 2212 08/27/20 2357 08/28/20 0445 08/28/20 2041 08/29/20 0415  NA 142 142 141  --  142   < > 138 142 142 141 139  K 4.1 4.2 4.0  --  3.9   < > 3.0* 3.0* 3.1* 3.7 3.4*  CL 107 107 105  --  103  --  102  --  106 104 103  CO2 21* 21* 23  --  21*  --  18*  --  20* 20* 20*  GLUCOSE 189* 224* 203*  --  179*  --  178*  --  132* 139* 158*  BUN 89* 90* 87*  --  90*  --  92*  --  92* 93* 94*  CREATININE 4.01* 4.06* 3.84*  --  3.95*  --  3.88*  --  3.94* 3.82* 3.89*  CALCIUM 5.5* 5.6* 5.7*  --  6.2*  --  5.9*  --  6.0* 6.3* 6.3*  MG 1.6* 1.8 1.6* 1.6*  --   --  1.6*   --   --   --   --   PHOS 5.7* 5.2* 5.1* 5.1* 5.2*  --   --   --   --   --   --    < > = values in this interval not displayed.   GFR: Estimated Creatinine Clearance: 22.8 mL/min (A) (by C-G formula based on SCr of 3.89 mg/dL (H)). Recent Labs  Lab 08/27/20 0608 08/27/20 2212 08/28/20 0445 08/28/20 0644 08/29/20 0415  WBC 3.3* 5.3 3.7*  --  2.2*  LATICACIDVEN  --  1.5  --  1.3  --     Liver Function Tests: Recent Labs  Lab 08/21/2020 0915 08/23/20 0250 08/24/20 0121 08/25/20 0150 08/27/20 0608  AST $Re'15 15 16 22  'ftS$ --   ALT $Re'14 13 13 16  'bta$ --   ALKPHOS 468* 489* 398* 403*  --   BILITOT 0.2* 0.5 0.5 0.5  --   PROT 5.4* 5.5* 4.8* 5.3*  --   ALBUMIN 3.1* 3.1* 2.8* 3.0* 3.1*   No results for input(s): LIPASE, AMYLASE in the last 168 hours. No results for input(s): AMMONIA in the last 168 hours.  ABG    Component Value Date/Time   PHART 7.408 08/27/2020 2357   PCO2ART 38.0 08/27/2020 2357   PO2ART 105 08/27/2020 2357   HCO3 23.9 08/27/2020 2357   TCO2 25 08/27/2020 2357   ACIDBASEDEF 1.0 08/27/2020 2357   O2SAT 98.0 08/27/2020 2357     Coagulation Profile: Recent Labs  Lab 08/24/20 0121 08/27/20 2212 08/28/20 0445 08/29/20 0415  INR 1.3* 1.6* 1.6* 1.4*    Cardiac Enzymes: No results for input(s): CKTOTAL, CKMB, CKMBINDEX, TROPONINI in the last 168 hours.  HbA1C: Hemoglobin A1C  Date/Time Value Ref Range Status  12/02/2017 12:00 AM 4.8  Final  08/04/2017 12:00 AM 5.3  Final   Hgb A1c MFr Bld  Date/Time Value Ref Range Status  07/05/2020 05:32 AM 5.6 4.8 - 5.6 % Final    Comment:    (NOTE) Pre diabetes:          5.7%-6.4%  Diabetes:              >6.4%  Glycemic control for   <7.0% adults with diabetes   03/14/2020 10:15 PM 5.0 4.8 - 5.6 % Final    Comment:    (NOTE) Pre diabetes:          5.7%-6.4%  Diabetes:              >6.4%  Glycemic control for   <7.0% adults with diabetes     CBG: Recent Labs  Lab 08/28/20 0318 08/28/20 0811  08/28/20 1115 08/28/20 1538 08/28/20 2212  GLUCAP 134* 105* 96 90 132*    Critical care time: The patient is critically ill with multiple organ systems failure and requires high complexity decision making for assessment and support, frequent evaluation and titration of therapies, application of advanced monitoring technologies and extensive interpretation of multiple databases.  Critical care time 32 mins. This represents my time independent of the NPs time taking care of the pt. This is excluding procedures.    Audria Nine DO [facility] 08/29/2020, 9:10 AM See Amion for pager If no response to pager, please call [phone] until 1900 After 1900 please call [facility] [phone]

## 2020-08-29 NOTE — Plan of Care (Signed)
  Problem: Education: Goal: Knowledge of General Education information will improve Description: Including pain rating scale, medication(s)/side effects and non-pharmacologic comfort measures Outcome: Progressing   Problem: Health Behavior/Discharge Planning: Goal: Ability to manage health-related needs will improve Outcome: Progressing   Problem: Clinical Measurements: Goal: Ability to maintain clinical measurements within normal limits will improve Outcome: Progressing Goal: Will remain free from infection Outcome: Progressing Goal: Diagnostic test results will improve Outcome: Progressing Goal: Respiratory complications will improve Outcome: Progressing Goal: Cardiovascular complication will be avoided Outcome: Progressing   Problem: Activity: Goal: Risk for activity intolerance will decrease Outcome: Progressing   Problem: Nutrition: Goal: Adequate nutrition will be maintained Outcome: Progressing   Problem: Coping: Goal: Level of anxiety will decrease Outcome: Progressing   Problem: Elimination: Goal: Will not experience complications related to bowel motility Outcome: Progressing Goal: Will not experience complications related to urinary retention Outcome: Progressing   Problem: Safety: Goal: Ability to remain free from injury will improve Outcome: Progressing   Problem: Skin Integrity: Goal: Risk for impaired skin integrity will decrease Outcome: Progressing   Problem: Safety: Goal: Non-violent Restraint(s) Outcome: Progressing   Problem: Activity: Goal: Ability to tolerate increased activity will improve Outcome: Progressing   Problem: Respiratory: Goal: Ability to maintain a clear airway and adequate ventilation will improve Outcome: Progressing   Problem: Role Relationship: Goal: Method of communication will improve Outcome: Progressing   Problem: Safety: Goal: Non-violent Restraint(s) Outcome: Progressing

## 2020-08-29 NOTE — Progress Notes (Signed)
Initial Nutrition Assessment  DOCUMENTATION CODES:   Obesity unspecified  INTERVENTION:   Tube Feeding via OG: Vital 1.5 at 60 ml/hr Pro-Source TF 45 mL BID Provides 119 g of protein, 2240 kcals, 1094 mL of free water  Meets 100% estimated calorie and protein needs   NUTRITION DIAGNOSIS:   Inadequate oral intake related to acute illness as evidenced by NPO status.  GOAL:   Patient will meet greater than or equal to 90% of their needs  MONITOR:   Vent status,TF tolerance,Weight trends,Labs  REASON FOR ASSESSMENT:   Consult,Ventilator Enteral/tube feeding initiation and management  ASSESSMENT:   55 yo female admitted with acute respiratory failure secondary to CHF exacerbation and COVID-19 requiring intubation, acute encephalopathy, pancytopenia.  PMH includes CKD IV s/p allograft renal transplant, CHF, COVID dx 08/20/20, DM, HTN  2/18 Bone marrow biopsy 2/20 Intubation  Pt remains on vent support  OG tube with tip in stomach per abd xray  Recorded po intake 75-100% prior to intubation  Unable to obtain diet and weight history at this time  Labs: potassium 3.4 (L), Creatinine 3.89, BUN 94 Meds: Oscal with D, aranesp, decadron, colace, lasix, ss novolog, miralax, renvela with meals, Vit B-12, prograf   Diet Order:   Diet Order    None      EDUCATION NEEDS:   Not appropriate for education at this time  Skin:  Skin Assessment: Reviewed RN Assessment  Last BM:  2/21  Height:   Ht Readings from Last 1 Encounters:  08/28/20 $RemoveB'5\' 11"'xMjomnFe$  (1.803 m)    Weight:   Wt Readings from Last 1 Encounters:  08/29/20 117.4 kg    BMI:  Body mass index is 36.1 kg/m.  Estimated Nutritional Needs:   Kcal:  2110-2400 kcals  Protein:  115-130 g  Fluid:  >/= 1.8 L   Kerman Passey MS, RDN, LDN, CNSC Registered Dietitian III Clinical Nutrition RD Pager and On-Call Pager Number Located in [place]

## 2020-08-29 NOTE — Progress Notes (Signed)
ANTICOAGULATION CONSULT NOTE - Follow Up Consult  Pharmacy Consult for heparin Indication: h/o VTE and PAF  Labs: Recent Labs    08/27/20 2212 08/27/20 2357 08/28/20 0445 08/28/20 1121 08/28/20 1738 08/28/20 2041 08/29/20 0400 08/29/20 0415 08/29/20 1300 08/29/20 2200  HGB 7.0*   < > 6.1* 7.2*  --  8.0*  --  7.7*  --   --   HCT 23.0*   < > 20.5* 22.8*  --  25.0*  --  23.0*  --   --   PLT 138*  --  108*  --   --   --   --  125*  --   --   APTT  --   --  >200*  --    < >  --  126*  --  130* 65*  LABPROT 18.8*  --  18.7*  --   --   --   --  16.7*  --   --   INR 1.6*  --  1.6*  --   --   --   --  1.4*  --   --   HEPARINUNFRC  --   --  1.98*  --   --   --  1.04*  --   --   --   CREATININE 3.88*  --  3.94*  --   --  3.82*  --  3.89*  --   --   TROPONINIHS 237*  --  270*  --   --   --   --   --   --   --    < > = values in this interval not displayed.    Assessment: 55 yo female on heparin infusion who takes Eliquis 5mg  BID PTA for afib. 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 Her last dose of Eliquis was given on 2/20 @0802 . Will continue to monitor with aPTT given recent Eliquis administration.   Pt was admitted with symptomatic anemia and pancytopenia. Repeat aPTT is now slightly now below goal 65 seconds.    Goal of Therapy:  aPTT 66-102 seconds   Plan:  Increase heparin slightly to 650 units/h Recheck aPTT and heparin level in 8h   Arrie Senate, PharmD, [place], [facility] Clinical Pharmacist [phone] Please check AMION for all [place] numbers 08/29/2020

## 2020-08-30 DIAGNOSIS — J9601 Acute respiratory failure with hypoxia: Secondary | ICD-10-CM | POA: Diagnosis not present

## 2020-08-30 LAB — CBC
HCT: 25.2 % — ABNORMAL LOW (ref 36.0–46.0)
Hemoglobin: 8.2 g/dL — ABNORMAL LOW (ref 12.0–15.0)
MCH: 29.6 pg (ref 26.0–34.0)
MCHC: 32.5 g/dL (ref 30.0–36.0)
MCV: 91 fL (ref 80.0–100.0)
Platelets: 156 10*3/uL (ref 150–400)
RBC: 2.77 MIL/uL — ABNORMAL LOW (ref 3.87–5.11)
RDW: 18 % — ABNORMAL HIGH (ref 11.5–15.5)
WBC: 7.4 10*3/uL (ref 4.0–10.5)
nRBC: 0.3 % — ABNORMAL HIGH (ref 0.0–0.2)

## 2020-08-30 LAB — BASIC METABOLIC PANEL
Anion gap: 16 — ABNORMAL HIGH (ref 5–15)
BUN: 94 mg/dL — ABNORMAL HIGH (ref 6–20)
CO2: 21 mmol/L — ABNORMAL LOW (ref 22–32)
Calcium: 6.2 mg/dL — CL (ref 8.9–10.3)
Chloride: 103 mmol/L (ref 98–111)
Creatinine, Ser: 3.89 mg/dL — ABNORMAL HIGH (ref 0.44–1.00)
GFR, Estimated: 13 mL/min — ABNORMAL LOW (ref >60)
Glucose, Bld: 199 mg/dL — ABNORMAL HIGH (ref 70–99)
Potassium: 4 mmol/L (ref 3.5–5.1)
Sodium: 140 mmol/L (ref 135–145)

## 2020-08-30 LAB — PROTIME-INR
INR: 1.3 — ABNORMAL HIGH (ref 0.8–1.2)
Prothrombin Time: 15.7 seconds — ABNORMAL HIGH (ref 11.4–15.2)

## 2020-08-30 LAB — MAGNESIUM
Magnesium: 1.9 mg/dL (ref 1.7–2.4)
Magnesium: 2.1 mg/dL (ref 1.7–2.4)

## 2020-08-30 LAB — GLUCOSE, CAPILLARY
Glucose-Capillary: 171 mg/dL — ABNORMAL HIGH (ref 70–99)
Glucose-Capillary: 213 mg/dL — ABNORMAL HIGH (ref 70–99)
Glucose-Capillary: 142 mg/dL — ABNORMAL HIGH (ref 70–99)
Glucose-Capillary: 180 mg/dL — ABNORMAL HIGH (ref 70–99)
Glucose-Capillary: 173 mg/dL — ABNORMAL HIGH (ref 70–99)
Glucose-Capillary: 185 mg/dL — ABNORMAL HIGH (ref 70–99)

## 2020-08-30 LAB — PHOSPHORUS
Phosphorus: 5.3 mg/dL — ABNORMAL HIGH (ref 2.5–4.6)
Phosphorus: 5.1 mg/dL — ABNORMAL HIGH (ref 2.5–4.6)

## 2020-08-30 LAB — CULTURE, RESPIRATORY W GRAM STAIN

## 2020-08-30 LAB — PARATHYROID HORMONE, INTACT (NO CA): PTH: 1073 pg/mL — ABNORMAL HIGH (ref 15–65)

## 2020-08-30 LAB — APTT
aPTT: 53 seconds — ABNORMAL HIGH (ref 24–36)
aPTT: 58 seconds — ABNORMAL HIGH (ref 24–36)

## 2020-08-30 LAB — HEPARIN LEVEL (UNFRACTIONATED)
Heparin Unfractionated: 0.7 IU/mL (ref 0.30–0.70)
Heparin Unfractionated: 0.5 IU/mL (ref 0.30–0.70)

## 2020-08-30 MED ORDER — POTASSIUM CHLORIDE CRYS ER 20 MEQ PO TBCR: 20.0000 meq | EXTENDED_RELEASE_TABLET | Freq: Once | ORAL | Status: DC

## 2020-08-30 MED ORDER — POTASSIUM CHLORIDE 20 MEQ PO PACK
20.0000 meq | PACK | Freq: Once | ORAL | Status: AC
  Administered 2020-08-30: 20 meq
  Filled 2020-08-30: qty 1

## 2020-08-30 MED ORDER — TORSEMIDE 20 MG PO TABS
80.0000 mg | ORAL_TABLET | Freq: Once | ORAL | Status: AC
  Administered 2020-08-30: 80 mg
  Filled 2020-08-30: qty 4

## 2020-08-30 MED ORDER — MAGNESIUM SULFATE IN D5W 1-5 GM/100ML-% IV SOLN
1.0000 g | Freq: Once | INTRAVENOUS | Status: AC
  Administered 2020-08-30: 1 g via INTRAVENOUS
  Filled 2020-08-30: qty 100

## 2020-08-30 MED ORDER — TORSEMIDE 20 MG PO TABS
80.0000 mg | ORAL_TABLET | Freq: Once | ORAL | Status: DC
  Filled 2020-08-30: qty 4

## 2020-08-30 MED ORDER — CALCIUM GLUCONATE-NACL 2-0.675 GM/100ML-% IV SOLN
2.0000 g | Freq: Once | INTRAVENOUS | Status: AC
  Administered 2020-08-30: 2000 mg via INTRAVENOUS
  Filled 2020-08-30: qty 100

## 2020-08-30 NOTE — Progress Notes (Signed)
END OF SHIFT NOTE:  Patient resting in bed on ventilation, Alert near end of shift.   Neuro: Rass -1 most of shift, patient currently alert and able to follow commands. Patient gestures with wanting tube out, Remains in restraints at this time as she attempts to reach for tube upon awakenings. Patient cooperative with verbal frequent contacts. Purposeful movement to all extremities.   Cardiac: Patient started on Levo for map of 48. Maintain map greater than 65. Rhythm remains the same.   Resp: Vent settings remain unchanged. ETT 25 at Lip.   GI/GU. Foley remains In place. Patient noted to have 260 u/o entire shift. Feeding infusing per order via OG tube. No BM during shift.   Skin: skin remains intact, soft limb wrist restraints remain in place. Patient does have +2 edema. Bath and CHG given during shift.   Gtt: infusing per MAR.   Safety measures remain in place, will continue to monitor and sbarr to day shift nurse at change of shift.   Plan: Possible extubation.

## 2020-08-30 NOTE — Progress Notes (Signed)
ANTICOAGULATION CONSULT NOTE - Follow Up Consult  Pharmacy Consult for heparin Indication: h/o VTE and PAF  Labs: Recent Labs    08/27/20 2212 08/27/20 2357 08/28/20 0445 08/28/20 1121 08/28/20 2041 08/29/20 0400 08/29/20 0415 08/29/20 1300 08/29/20 2200 08/30/20 0351 08/30/20 0700 08/30/20 0804  HGB 7.0*   < > 6.1*   < > 8.0*  --  7.7*  --   --  8.2*  --   --   HCT 23.0*   < > 20.5*   < > 25.0*  --  23.0*  --   --  25.2*  --   --   PLT 138*  --  108*  --   --   --  125*  --   --  156  --   --   APTT  --   --  >200*   < >  --  126*  --  130* 65*  --   --  53*  LABPROT 18.8*  --  18.7*  --   --   --  16.7*  --   --  15.7*  --   --   INR 1.6*  --  1.6*  --   --   --  1.4*  --   --  1.3*  --   --   HEPARINUNFRC  --   --  1.98*  --   --  1.04*  --   --   --   --  0.70  --   CREATININE 3.88*  --  3.94*  --  3.82*  --  3.89*  --   --  3.89*  --   --   TROPONINIHS 237*  --  270*  --   --   --   --   --   --   --   --   --    < > = values in this interval not displayed.    Assessment: 54 yo female on heparin infusion who takes Eliquis 5mg  BID PTA for afib. 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 Her last dose of Eliquis was given on 2/20 @0802 . Will continue to monitor with aPTT given recent Eliquis administration.   S/p rate increase to 650 units/hr aPTT subtherapeutic at 53 seconds, anti-Xa level close to correlating down to 0.7, previously supratherapeutic on 800 units/hr  Goal of Therapy:  aPTT 66-102 seconds   Plan:  Increase heparin gtt to 700 units/hr F/u aPTT/HL in 8 hours  Bertis Ruddy, PharmD Clinical Pharmacist Please check AMION for all [place] numbers 08/30/2020 9:26 AM

## 2020-08-30 NOTE — Progress Notes (Signed)
[facility] Progress Note Patient Name: [name] DOB: [birth year] MRN: [id number]   Date of Service  08/30/2020  HPI/Events of Note  Notified of calcium 6.2, phos 5.3, Mg 1.9, crea 3.09.  eICU Interventions  Replete Ca and Mg.     Intervention Category Minor Interventions: Electrolytes abnormality - evaluation and management  [name] 08/30/2020, 5:26 AM

## 2020-08-30 NOTE — Progress Notes (Signed)
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  Had bone marrow biopsy 2/18.  Had rapid deterioration on the floor today, 2/20, where she had increasing oxygen requirements, and severe agitation (was hypertensive) requiring transfer to ICU.   History of Present Illness:  Patient was initially admitted for pancytopenia on 2/15 with a hemoglobin of 6.1, hypocalcemia, in the setting of COVID-19.  For her pancytopenia, she had a bone marrow biopsy on 2/18, but this has been thought to be related to chronic disease. She has been diuresed, with fluctuating creatinine (nephro on board) but good urine output.  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   She is also getting decadron 11m for COVID-19 pneumonia.  For her hypocalcemia, due to her CKD, she is getting vitamin D repletion, calcitriol, and as needed IV calcium replacement   She was initially on 4L of O2 for her stay until this morning, when she had increasing O2 requirements and agitated delirium.  Also noted to be hypertensive during this episode and had an episode of hemoptysis along with increasing O2 requirements.    She was very agitated on arrival to ICU on 2/20, was on nonrebreather mask, and could not get an accurate pulse ox.  ABG showed respiratory acidosis (pre-intubation).  Patient was urgently intubated due to respiratory failure.     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 Remains off cellcept. Mental status remains poor 2/21: remains sedated, renal insufficiency cont to worsen by indices but uop good. . hgb remains low and platelets cont to fall. Echo without rhs and stable from 12/21, le dopplers pending with RLE >>LLE  In size. On minimal vent settings, pt moves but does not follow commands. Transfusing one unit blood for hgb 6.1. no overt bleeding noted.   Objective   Blood pressure (!) 121/52, pulse 69, temperature 97.7 F (36.5 C), resp. rate 17, height _0  (1.803 m), weight 119.5 kg, last menstrual period 11/05/2012, SpO2 100 %.    Vent Mode: PRVC FiO2 (%):  [30 %] 30 % Set Rate:  [28 bmp] 28 bmp Vt Set:  [420 mL] 420 mL PEEP:  [5 cmH20] 5 cmH20 Pressure Support:  [5 cmH20] 5 cmH20 Plateau Pressure:  [16 cmH20-24 cmH20] 23 cmH20   Intake/Output Summary (Last 24 hours) at 08/30/2020 1020 Last data filed at 08/30/2020 1000 Gross per 24 hour  Intake 1957.72 ml  Output 735 ml  Net 1222.72 ml   Filed Weights   08/28/20 1800  08/29/20 0410 08/30/20 0500  Weight: 116.4 kg 117.4 kg 119.5 kg    Examination: General: awake and following commands on vent. restless HENT: NCAT, eyes, equal and round, mmmp Lungs:diminished bilaterally with expiratory wheeze Cardiovascular: regular rate and rhythm Abdomen: obese soft, nontender, nondistended Extremities: pitting edema b/l R >>L le Neuro: sedated, but arousable on sbt, answering simple questions with seemingly  appropriate answers. Nodding she is having difficult breathing at this time.    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  Had acute decompensation on 2/20 with increasing O2 requirements, agitation, and acute hypoxic and hypercarbic respiratory failure requiring intubation.    # Acute hypoxic and hypercarbic respiratory failure # COVID-19 # CHF exacerbation intubated 2/20 -possible covid worsening  vs pulm edema  - also PE is of consideration, her AC had been pasued for the BM biopsy ( ), but otherwise she has been on anticoagulation.  Will hold on a CTA PE for now in light of her poor kidney function, but will consider if does not improve with diuresis. (less likely in setting of stable echo without rhs, and negative le venous doppler) Plan: - ventilation with goal of 6cc/kg - diuresis with goal at least 1-2 L net negative - continue decadron 60m daily for COVID-19 treatment - troponin and EKG - holding eliquis--> heparin gtt - le venous doppler negative    # Acute encephalopathy: Agitated delirium now resolved s/p intubation and sedation.  Agitation likely precipitated by worsening hypoxia on the floor.  - daily spontaneous awaking trials -improved and following commands this am.   # Pancytopenia: s/p bone marrow biopsy 2/18.  Likely from chronic illness, and advanced renal disease - follow up BM bx results - trend CBC - no need for trasnfusion at this time  # CKD stage V # Hypocalcemia Currently has good UOP with diuresis.  - appreciate nephrology recs - BMP now  # Hypertension: currenly on bidil, amiodarone, and toprol XL - holding metoprolol while getting sedation.  Can restart if BP tolerates - continue bidil and amiodarone for now  # pAFib: in sinus  now - continue amiodarone   # AT III deficiency # RUE DVT - continue anticoagulation.  -cont heparin  Best practice (evaluated daily)  Diet: NPO Pain/Anxiety/Delirium protocol (if indicated): yes VAP protocol (if indicated): yes DVT prophylaxis: heparin gtt GI prophylaxis: ppi  Glucose control: SSI q4H Mobility: n/a Disposition:ICU  Goals of Care:  Last date of multidisciplinary goals of care discussion: daughter via phone 2/23 Family and staff present: via phone Summary of discussion: cont with current goals Follow up goals of care discussion due: 5 days Code Status: Full  Labs   CBC: Recent Labs  Lab 08/25/20 1158 08/26/20 0245 08/27/20 0608 08/27/20 2018 08/27/20 2212 08/27/20 2357 08/28/20 0445 08/28/20 1121 08/28/20 2041 08/29/20 0415 08/30/20 0351  WBC 2.8*   < > 3.3*  --  5.3  --  3.7*  --   --  2.2* 7.4  NEUTROABS 2.2  --   --   --  4.5  --   --   --   --   --   --   HGB 7.8*   < > 7.8*   < > 7.0*   < > 6.1* 7.2* 8.0* 7.7*  8.2*  HCT 24.8*   < > 24.6*   < > 23.0*   < > 20.5* 22.8* 25.0* 23.0* 25.2*  MCV 94.7   < > 94.3  --  96.2  --  97.2  --   --  88.5 91.0  PLT 109*   < > 134*  --  138*  --  108*  --   --  125* 156   < > = values in this interval not displayed.    Basic Metabolic Panel: Recent Labs  Lab 08/26/20 0245 08/27/20 0223 08/27/20 6767 08/27/20 2018 08/27/20 2212 08/27/20 2357 08/28/20 0445 08/28/20 2041 08/29/20 0415 08/29/20 1751 08/30/20 0351  NA 141  --  142   < > 138 142 142 141 139  --  140  K 4.0  --  3.9   < > 3.0* 3.0* 3.1* 3.7 3.4*  --  4.0  CL 105  --  103  --  102  --  106 104 103  --  103  CO2 23  --  21*  --  18*  --  20* 20* 20*  --  21*  GLUCOSE 203*  --  179*  --  178*  --  132* 139* 158*  --  199*  BUN 87*  --  90*  --  92*  --  92* 93* 94*  --  94*  CREATININE 3.84*  --  3.95*  --  3.88*  --  3.94* 3.82* 3.89*  --  3.89*  CALCIUM 5.7*  --  6.2*  --  5.9*  --  6.0* 6.3* 6.3*  --  6.2*  MG 1.6* 1.6*  --   --   1.6*  --   --   --   --  2.0 1.9  PHOS 5.1* 5.1* 5.2*  --   --   --   --   --   --  5.0* 5.3*   < > = values in this interval not displayed.   GFR: Estimated Creatinine Clearance: 23 mL/min (A) (by C-G formula based on SCr of 3.89 mg/dL (H)). Recent Labs  Lab 08/27/20 2212 08/28/20 0445 08/28/20 0644 08/29/20 0415 08/30/20 0351  WBC 5.3 3.7*  --  2.2* 7.4  LATICACIDVEN 1.5  --  1.3  --   --     Liver Function Tests: Recent Labs  Lab 08/24/20 0121 08/25/20 0150 08/27/20 0608  AST 16 22  --   ALT 13 16  --   ALKPHOS 398* 403*  --   BILITOT 0.5 0.5  --   PROT 4.8* 5.3*  --   ALBUMIN 2.8* 3.0* 3.1*   No results for input(s): LIPASE, AMYLASE in the last 168 hours. No results for input(s): AMMONIA in the last 168 hours.  ABG    Component Value Date/Time   PHART 7.408 08/27/2020 2357   PCO2ART 38.0 08/27/2020 2357   PO2ART 105 08/27/2020 2357   HCO3 23.9 08/27/2020 2357   TCO2 25 08/27/2020 2357   ACIDBASEDEF 1.0 08/27/2020 2357   O2SAT 98.0 08/27/2020 2357     Coagulation Profile: Recent Labs  Lab 08/24/20 0121 08/27/20 2212 08/28/20 0445 08/29/20 0415 08/30/20 0351  INR 1.3* 1.6* 1.6* 1.4* 1.3*    Cardiac Enzymes: No results for input(s): CKTOTAL, CKMB, CKMBINDEX, TROPONINI in the last 168 hours.  HbA1C: Hemoglobin A1C  Date/Time Value Ref Range Status  12/02/2017 12:00 AM 4.8  Final  08/04/2017 12:00 AM 5.3  Final   Hgb A1c MFr  Bld  Date/Time Value Ref Range Status  07/05/2020 05:32 AM 5.6 4.8 - 5.6 % Final    Comment:    (NOTE) Pre diabetes:          5.7%-6.4%  Diabetes:              >6.4%  Glycemic control for   <7.0% adults with diabetes   03/14/2020 10:15 PM 5.0 4.8 - 5.6 % Final    Comment:    (NOTE) Pre diabetes:          5.7%-6.4%  Diabetes:              >6.4%  Glycemic control for   <7.0% adults with diabetes     CBG: Recent Labs  Lab 08/29/20 1516 08/29/20 1925 08/29/20 2335 08/30/20 0325 08/30/20 0721  GLUCAP 134*  142* 171* 180* 213*    Critical care time: The patient is critically ill with multiple organ systems failure and requires high complexity decision making for assessment and support, frequent evaluation and titration of therapies, application of advanced monitoring technologies and extensive interpretation of multiple databases.  Critical care time 38 mins. This represents my time independent of the NPs time taking care of the pt. This is excluding procedures.    Audria Nine DO [facility] 08/30/2020, 10:20 AM See Amion for pager If no response to pager, please call [phone] until 1900 After 1900 please call [facility] [phone]

## 2020-08-30 NOTE — Progress Notes (Addendum)
E-Link contacted at this time for critical lab value of 6.2 Calcium

## 2020-08-30 NOTE — Progress Notes (Signed)
ANTICOAGULATION CONSULT NOTE - Follow Up Consult  Pharmacy Consult for heparin Indication: h/o VTE and PAF  Labs: Recent Labs    08/27/20 2212 08/27/20 2357 08/28/20 0445 08/28/20 1121 08/28/20 2041 08/29/20 0400 08/29/20 0415 08/29/20 1300 08/29/20 2200 08/30/20 0351 08/30/20 0700 08/30/20 0804 08/30/20 1713  HGB 7.0*   < > 6.1*   < > 8.0*  --  7.7*  --   --  8.2*  --   --   --   HCT 23.0*   < > 20.5*   < > 25.0*  --  23.0*  --   --  25.2*  --   --   --   PLT 138*  --  108*  --   --   --  125*  --   --  156  --   --   --   APTT  --   --  >200*   < >  --  126*  --    < > 65*  --   --  53* 58*  LABPROT 18.8*  --  18.7*  --   --   --  16.7*  --   --  15.7*  --   --   --   INR 1.6*  --  1.6*  --   --   --  1.4*  --   --  1.3*  --   --   --   HEPARINUNFRC  --    < > 1.98*  --   --  1.04*  --   --   --   --  0.70  --  0.50  CREATININE 3.88*  --  3.94*  --  3.82*  --  3.89*  --   --  3.89*  --   --   --   TROPONINIHS 237*  --  270*  --   --   --   --   --   --   --   --   --   --    < > = values in this interval not displayed.    Assessment: 55 yo female on heparin infusion who takes Eliquis 5mg  BID PTA for afib. 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 Her last dose of Eliquis was given on 2/20 @0802   Hep lvl 0.5 - within goal aptt low 58 - can probably stop checking  Goal of Therapy:  Heparin level 0.3-0.7 units/ml   Plan:  Increase heparin gtt to 750 units/hr Daily hep lvl cbc  Barth Kirks, PharmD, BCPS, BCCCP Clinical Pharmacist [phone]  Please check AMION for all [place] numbers  08/30/2020 7:12 PM

## 2020-08-30 NOTE — Progress Notes (Signed)
[facility] ROUNDING NOTE   Subjective:    had 835 mL UOP over 2/22. She's on levo at 2 mcg.  on sedation. still agitated.  Remains intubated.  Per team not following commands  Review of systems:  Unable to obtain 2/2 intubated and sedated  ------------------ Brief History:  55 year old lady with a history of renal transplant cadaveric 2011 appears like a baseline creatinine about 2.5 mg/dL based on notes from office.  However during her hospitalizations in January it looks like her creatinine has gradually been worsening.  On 08/06/2020 it did increase to 3.66 mg/dL. she has a history of hypertension and congestive heart failure with a diminished ejection fraction of about 30%.  Atrial fibrillation and thrombophilia with an antithrombin III deficiency she presented 08/14/2020 with shortness of breath.  She was diagnosed with COVID-19 pneumonia 08/20/2020.  She has been admitted for Decadron and close monitoring. she has been evaluated by hematology and plans will be for a bone marrow biopsy as an inpatient.  Most recent hospitalizations #1 07/04/2020  -3/32/9518 complications of acute on chronic heart failure with a creatinine that had increased during hospitalization. Most recent hospitalizations #2   08/01/2020 -08/06/2020 with worsening anemia.   Objective:  Vital signs in last 24 hours:  Temp:  [91.76 F (33.2 C)-98.2 F (36.8 C)] 98.06 F (36.7 C) (02/23 0743) Pulse Rate:  [46-86] 86 (02/23 0743) Resp:  [6-28] 22 (02/23 0743) BP: (121-160)/(52-68) 121/52 (02/22 1548) SpO2:  [95 %-100 %] 97 % (02/23 0743) Arterial Line BP: (84-162)/(35-69) 121/45 (02/23 0600) FiO2 (%):  [30 %] 30 % (02/23 0743) Weight:  [119.5 kg] 119.5 kg (02/23 0500)  Weight change: 3.1 kg Filed Weights   08/28/20 1800 08/29/20 0410 08/30/20 0500  Weight: 116.4 kg 117.4 kg 119.5 kg    Intake/Output: I/O last 3 completed shifts: In: 2278.9 [I.V.:1499.1; NG/GT:480; IV Piggyback:299.8] Out:  8416 [Urine:1335]   Intake/Output this shift:  Total I/O In: -  Out: 100 [Urine:100]  General adult female in bed critically ill   HEENT normocephalic atraumatic  Neck normal circumference trachea midline Lungs clear but reduced on resp auscultation Heart S1S2 no rub Abdomen soft nontender nondistended Extremities 1+ edema  Neuro sedation continuously running; opens eyes to exam Psych agitated - moving in bed   Basic Metabolic Panel: Recent Labs  Lab 08/26/20 0245 08/27/20 0223 08/27/20 6063 08/27/20 2018 08/27/20 2212 08/27/20 2357 08/28/20 0445 08/28/20 2041 08/29/20 0415 08/29/20 1751 08/30/20 0351  NA 141  --  142   < > 138 142 142 141 139  --  140  K 4.0  --  3.9   < > 3.0* 3.0* 3.1* 3.7 3.4*  --  4.0  CL 105  --  103  --  102  --  106 104 103  --  103  CO2 23  --  21*  --  18*  --  20* 20* 20*  --  21*  GLUCOSE 203*  --  179*  --  178*  --  132* 139* 158*  --  199*  BUN 87*  --  90*  --  92*  --  92* 93* 94*  --  94*  CREATININE 3.84*  --  3.95*  --  3.88*  --  3.94* 3.82* 3.89*  --  3.89*  CALCIUM 5.7*  --  6.2*  --  5.9*  --  6.0* 6.3* 6.3*  --  6.2*  MG 1.6* 1.6*  --   --  1.6*  --   --   --   --  2.0 1.9  PHOS 5.1* 5.1* 5.2*  --   --   --   --   --   --  5.0* 5.3*   < > = values in this interval not displayed.    Liver Function Tests: Recent Labs  Lab 08/24/20 0121 08/25/20 0150 08/27/20 0608  AST 16 22  --   ALT 13 16  --   ALKPHOS 398* 403*  --   BILITOT 0.5 0.5  --   PROT 4.8* 5.3*  --   ALBUMIN 2.8* 3.0* 3.1*   CBC: Recent Labs  Lab 08/25/20 1158 08/26/20 0245 08/27/20 8841 08/27/20 2018 08/27/20 2212 08/27/20 2357 08/28/20 0445 08/28/20 1121 08/28/20 2041 08/29/20 0415 08/30/20 0351  WBC 2.8*   < > 3.3*  --  5.3  --  3.7*  --   --  2.2* 7.4  NEUTROABS 2.2  --   --   --  4.5  --   --   --   --   --   --   HGB 7.8*   < > 7.8*   < > 7.0*   < > 6.1* 7.2* 8.0* 7.7* 8.2*  HCT 24.8*   < > 24.6*   < > 23.0*   < > 20.5* 22.8* 25.0*  23.0* 25.2*  MCV 94.7   < > 94.3  --  96.2  --  97.2  --   --  88.5 91.0  PLT 109*   < > 134*  --  138*  --  108*  --   --  125* 156   < > = values in this interval not displayed.    CBG: Recent Labs  Lab 08/29/20 0131 08/29/20 0317 08/29/20 0817 08/29/20 1107 08/29/20 1516  GLUCAP 165* 154* 148* 148* 134*    Microbiology: Results for orders placed or performed during the hospital encounter of 08/12/2020  Resp Panel by RT-PCR (Flu A&B, Covid) Nasopharyngeal Swab     Status: Abnormal   Collection Time: 08/26/2020  9:51 AM   Specimen: Nasopharyngeal Swab; Nasopharyngeal(NP) swabs in vial transport medium  Result Value Ref Range Status   SARS Coronavirus 2 by RT PCR POSITIVE (A) NEGATIVE Final    Comment: RESULT CALLED TO, READ BACK BY AND VERIFIED WITH: RN MISSY E. [phone] (NOTE) SARS-CoV-2 target nucleic acids are DETECTED.  The SARS-CoV-2 RNA is generally detectable in upper respiratory specimens during the acute phase of infection. Positive results are indicative of the presence of the identified virus, but do not rule out bacterial infection or co-infection with other pathogens not detected by the test. Clinical correlation with patient history and other diagnostic information is necessary to determine patient infection status. The expected result is Negative.  Fact Sheet for Patients: EntrepreneurPulse.com.au  Fact Sheet for Healthcare Providers: IncredibleEmployment.be  This test is not yet approved or cleared by the [place] FDA and  has been authorized for detection and/or diagnosis of SARS-CoV-2 by FDA under an Emergency Use Authorization (EUA).  This EUA will remain in effect (meaning this test can be used) for  the duration of  the COVID-19 declaration under Section 564(b)(1) of the Act, 21 U.S.C. section 360bbb-3(b)(1), unless the authorization is terminated or revoked sooner.     Influenza A by PCR NEGATIVE  NEGATIVE Final   Influenza B by PCR NEGATIVE NEGATIVE Final    Comment: (NOTE) The Xpert Xpress SARS-CoV-2/FLU/RSV plus assay is intended as an aid in the diagnosis of influenza from Nasopharyngeal swab specimens and  should not be used as a sole basis for treatment. Nasal washings and aspirates are unacceptable for Xpert Xpress SARS-CoV-2/FLU/RSV testing.  Fact Sheet for Patients: EntrepreneurPulse.com.au  Fact Sheet for Healthcare Providers: IncredibleEmployment.be  This test is not yet approved or cleared by the [place] FDA and has been authorized for detection and/or diagnosis of SARS-CoV-2 by FDA under an Emergency Use Authorization (EUA). This EUA will remain in effect (meaning this test can be used) for the duration of the COVID-19 declaration under Section 564(b)(1) of the Act, 21 U.S.C. section 360bbb-3(b)(1), unless the authorization is terminated or revoked.  Performed at [facility], [place] [address] [place], [place] [address]   MRSA PCR Screening     Status: None   Collection Time: 08/20/2020 11:16 PM   Specimen: Nasal Mucosa; Nasopharyngeal  Result Value Ref Range Status   MRSA by PCR NEGATIVE NEGATIVE Final    Comment:        The GeneXpert MRSA Assay (FDA approved for NASAL specimens only), is one component of a comprehensive MRSA colonization surveillance program. It is not intended to diagnose MRSA infection nor to guide or monitor treatment for MRSA infections. Performed at [facility], [place] [address], [place], [place] [address]   Culture, blood (routine x 2)     Status: None (Preliminary result)   Collection Time: 08/27/20 10:54 PM   Specimen: BLOOD RIGHT HAND  Result Value Ref Range Status   Specimen Description BLOOD RIGHT HAND  Final   Special Requests   Final    BOTTLES DRAWN AEROBIC ONLY Blood Culture results may not be optimal due to an inadequate volume of blood received in culture bottles    Culture   Final    NO GROWTH 3 DAYS Performed at [facility], [place] [address] [place], [place] [address]    Report Status PENDING  Incomplete  Culture, blood (routine x 2)     Status: None (Preliminary result)   Collection Time: 08/27/20 10:58 PM   Specimen: BLOOD RIGHT ARM  Result Value Ref Range Status   Specimen Description BLOOD RIGHT ARM  Final   Special Requests   Final    BOTTLES DRAWN AEROBIC ONLY Blood Culture results may not be optimal due to an inadequate volume of blood received in culture bottles   Culture   Final    NO GROWTH 3 DAYS Performed at [facility], [place] [address], [place], [place] [address]    Report Status PENDING  Incomplete  Culture, Respiratory w Gram Stain     Status: None (Preliminary result)   Collection Time: 08/28/20  1:57 AM   Specimen: Tracheal Aspirate; Respiratory  Result Value Ref Range Status   Specimen Description TRACHEAL ASPIRATE  Final   Special Requests NONE  Final   Gram Stain   Final    FEW WBC PRESENT,BOTH PMN AND MONONUCLEAR FEW GRAM NEGATIVE RODS RARE GRAM POSITIVE COCCI IN CHAINS    Culture   Final    RARE KLEBSIELLA PNEUMONIAE SUSCEPTIBILITIES TO FOLLOW CULTURE REINCUBATED FOR BETTER GROWTH Performed at [facility], [place] [address] [place], [place] [address]    Report Status PENDING  Incomplete    Coagulation Studies: Recent Labs    08/27/20 2210/09/22 08/28/20 0445 08/29/20 0415 08/30/20 0351  LABPROT 18.8* 18.7* 16.7* 15.7*  INR 1.6* 1.6* 1.4* 1.3*    Urinalysis: No results for input(s): COLORURINE, LABSPEC, PHURINE, GLUCOSEU, HGBUR, BILIRUBINUR, KETONESUR, PROTEINUR, UROBILINOGEN, NITRITE, LEUKOCYTESUR in  the last 72 hours.  Invalid input(s): APPERANCEUR    Imaging: DG CHEST PORT 1 VIEW  Result Date: 08/28/2020 CLINICAL DATA:  Central venous catheter in place. EXAM: PORTABLE CHEST 1 VIEW COMPARISON:  08/27/2020 FINDINGS: Endotracheal tube terminates 2.7 cm above the carina. Left jugular  catheter is unchanged with tip in the expected region of the confluence of the brachiocephalic veins. Enteric tube courses into the abdomen with tip not imaged. The cardiac silhouette is largely obscured. Widespread bilateral airspace opacities are unchanged on the left and may have mildly progressed in the right lung base. There is a small right pleural effusion. No pneumothorax is identified. IMPRESSION: 1. Support devices as above. 2. Extensive bilateral airspace disease with possible mild progression in the right lung base. Electronically Signed   By: Logan Bores M.D.   On: 08/28/2020 16:27   VAS [place] LOWER EXTREMITY VENOUS (DVT)  Result Date: 08/28/2020  Lower Venous DVT Study Indications: Swelling, Covid-19.  Limitations: Poor ultrasound/tissue interface. Comparison Study: 03-16-2020 Prior bilateral lower extremity venous study was                   negative for DVT. Performing Technologist: Darlin Coco RDMS  Examination Guidelines: A complete evaluation includes B-mode imaging, spectral Doppler, color Doppler, and power Doppler as needed of all accessible portions of each vessel. Bilateral testing is considered an integral part of a complete examination. Limited examinations for reoccurring indications may be performed as noted. The reflux portion of the exam is performed with the patient in reverse Trendelenburg.  +---------+---------------+---------+-----------+----------+--------------+ RIGHT    CompressibilityPhasicitySpontaneityPropertiesThrombus Aging +---------+---------------+---------+-----------+----------+--------------+ CFV      Full           Yes      Yes                                 +---------+---------------+---------+-----------+----------+--------------+ SFJ      Full                                                        +---------+---------------+---------+-----------+----------+--------------+ FV Prox  Full                                                         +---------+---------------+---------+-----------+----------+--------------+ FV Mid   Full                                                        +---------+---------------+---------+-----------+----------+--------------+ FV DistalFull                                                        +---------+---------------+---------+-----------+----------+--------------+ PFV      Full                                                        +---------+---------------+---------+-----------+----------+--------------+  POP      Full           Yes      Yes                                 +---------+---------------+---------+-----------+----------+--------------+ PTV      Full                                                        +---------+---------------+---------+-----------+----------+--------------+ PERO     Full                                                        +---------+---------------+---------+-----------+----------+--------------+   +---------+---------------+---------+-----------+----------+--------------+ LEFT     CompressibilityPhasicitySpontaneityPropertiesThrombus Aging +---------+---------------+---------+-----------+----------+--------------+ CFV      Full           Yes      Yes                                 +---------+---------------+---------+-----------+----------+--------------+ SFJ      Full                                                        +---------+---------------+---------+-----------+----------+--------------+ FV Prox  Full                                                        +---------+---------------+---------+-----------+----------+--------------+ FV Mid   Full                                                        +---------+---------------+---------+-----------+----------+--------------+ FV DistalFull                                                         +---------+---------------+---------+-----------+----------+--------------+ PFV      Full                                                        +---------+---------------+---------+-----------+----------+--------------+ POP      Full           Yes      Yes                                 +---------+---------------+---------+-----------+----------+--------------+  PTV      Full                                                        +---------+---------------+---------+-----------+----------+--------------+ PERO     Full                                                        +---------+---------------+---------+-----------+----------+--------------+     Summary: RIGHT: - There is no evidence of deep vein thrombosis in the lower extremity.  - No cystic structure found in the popliteal fossa.  LEFT: - There is no evidence of deep vein thrombosis in the lower extremity.  - No cystic structure found in the popliteal fossa.  *See table(s) above for measurements and observations. Electronically signed by Ruta Hinds MD on 08/28/2020 at 5:49:21 PM.    Final    ECHOCARDIOGRAM LIMITED  Result Date: 08/28/2020    ECHOCARDIOGRAM LIMITED REPORT   Patient Name:   HYACINTH MARCELLI Date of Exam: 08/28/2020 Medical Rec #:  [id number]       Height:       71.0 in Accession #:    [id number]      Weight:       256.6 lb Date of Birth:  1965-08-24       BSA:          2.344 m Patient Age:    20 years        BP:           209/100 mmHg Patient Gender: F               HR:           53 bpm. Exam Location:  Inpatient Procedure: Limited Echo, Limited Color Doppler and Cardiac Doppler STAT ECHO Indications:    acute respiratory distress  History:        Patient has prior history of Echocardiogram examinations, most                 recent 07/05/2020. Covid; Arrythmias:Paroxymal A-fib.  Sonographer:    Johny Chess Referring Phys: [id number] ALEXANDER N RAINES IMPRESSIONS  1. Left ventricular ejection fraction, by  estimation, is 35 to 40%. The left ventricle has moderately decreased function. The left ventricle demonstrates global hypokinesis. The left ventricular internal cavity size was severely dilated. There is severe  concentric left ventricular hypertrophy. Left ventricular diastolic parameters are consistent with Grade II diastolic dysfunction (pseudonormalization).  2. Right ventricular systolic function is normal. There is mildly elevated pulmonary artery systolic pressure. The estimated right ventricular systolic pressure is 24.4 mmHg.  3. A small pericardial effusion is present. The pericardial effusion is lateral to the left ventricle.  4. The mitral valve is grossly normal. Trivial mitral valve regurgitation.  5. There is moderate calcification of the aortic valve. Aortic valve regurgitation is moderate.  6. The inferior vena cava is normal in size with greater than 50% respiratory variability, suggesting right atrial pressure of 3 mmHg. Comparison(s): No prior Echocardiogram. Conclusion(s)/Recommendation(s): There is new reduced ejection fraction with calcified aortic valve, ventricular dilation, and aortic regurgitation. Would consider evaluation for bicuspid aortic valve (CCTA vs  TEE as a future study). FINDINGS  Left Ventricle: LVEF 46% by Quinones equation, 45% by Dolores Patty. Left ventricular ejection fraction, by estimation, is 35 to 40%. The left ventricle has moderately decreased function. The left ventricle demonstrates global hypokinesis. The left ventricular internal cavity size was severely dilated. There is severe concentric left ventricular hypertrophy. Left ventricular diastolic parameters are consistent with Grade II diastolic dysfunction (pseudonormalization). Right Ventricle: Right ventricular systolic function is normal. There is mildly elevated pulmonary artery systolic pressure. The tricuspid regurgitant velocity is 2.65 m/s, and with an assumed right atrial pressure of 8 mmHg, the  estimated right ventricular systolic pressure is 12.7 mmHg. Pericardium: A small pericardial effusion is present. The pericardial effusion is lateral to the left ventricle. Mitral Valve: The mitral valve is grossly normal. Mild mitral annular calcification. Trivial mitral valve regurgitation. Tricuspid Valve: The tricuspid valve is grossly normal. Tricuspid valve regurgitation is mild. Aortic Valve: There is moderate calcification of the aortic valve. Aortic valve regurgitation is moderate. Aortic regurgitation PHT measures 531 msec. Pulmonic Valve: The pulmonic valve was grossly normal. Pulmonic valve regurgitation is mild. Aorta: The aortic root and ascending aorta are structurally normal, with no evidence of dilitation. Venous: The inferior vena cava is normal in size with greater than 50% respiratory variability, suggesting right atrial pressure of 3 mmHg. LEFT VENTRICLE PLAX 2D LVIDd:         6.40 cm  Diastology LVIDs:         5.10 cm  LV e' medial:    4.35 cm/s LV PW:         1.70 cm  LV E/e' medial:  18.3 LV IVS:        1.70 cm  LV e' lateral:   5.22 cm/s LVOT diam:     2.30 cm  LV E/e' lateral: 15.2 LV SV:         108 LV SV Index:   46 LVOT Area:     4.15 cm  RIGHT VENTRICLE             IVC RV S prime:     10.40 cm/s  IVC diam: 1.90 cm LEFT ATRIUM         Index LA diam:    5.70 cm 2.43 cm/m  AORTIC VALVE LVOT Vmax:   108.00 cm/s LVOT Vmean:  66.700 cm/s LVOT VTI:    0.259 m AI PHT:      531 msec  AORTA Ao Root diam: 3.10 cm Ao Asc diam:  3.60 cm MITRAL VALVE               TRICUSPID VALVE MV Area (PHT): 2.14 cm    TR Peak grad:   28.1 mmHg MV Decel Time: 354 msec    TR Vmax:        265.00 cm/s MV E velocity: 79.60 cm/s MV A velocity: 52.00 cm/s  SHUNTS MV E/A ratio:  1.53        Systemic VTI:  0.26 m                            Systemic Diam: 2.30 cm Rudean Haskell MD Electronically signed by Rudean Haskell MD Signature Date/Time: 08/28/2020/10:30:29 AM    Final      Medications:   .  feeding supplement (VITAL 1.5 CAL) 1,000 mL (08/29/20 2123)  . fentaNYL infusion INTRAVENOUS 50 mcg/hr (08/30/20 0600)  . heparin 650 Units/hr (08/30/20 0600)  . norepinephrine (  LEVOPHED) Adult infusion 6 mcg/min (08/30/20 7793)  . propofol (DIPRIVAN) infusion 30 mcg/kg/min (08/30/20 0600)   . amiodarone  200 mg Per Tube Daily  . atorvastatin  40 mg Per Tube Daily  . calcitRIOL  4 mcg Per Tube BID  . calcium-vitamin D  1 tablet Per Tube TID  . chlorhexidine gluconate (MEDLINE KIT)  15 mL Mouth Rinse BID  . Chlorhexidine Gluconate Cloth  6 each Topical Daily  . Chlorhexidine Gluconate Cloth  6 each Topical Daily  . [START ON 09/02/2020] Darbepoetin Alfa  100 mcg Subcutaneous Q Sat-1800  . dexamethasone  6 mg Per Tube Q24H  . docusate  100 mg Per Tube BID  . feeding supplement (PROSource TF)  45 mL Per Tube BID  . insulin aspart  0-9 Units Subcutaneous Q4H  . isosorbide-hydrALAZINE  1.5 tablet Per Tube TID  . mouth rinse  15 mL Mouth Rinse 10 times per day  . pantoprazole sodium  40 mg Per Tube Daily  . polyethylene glycol  17 g Per Tube Daily  . sevelamer carbonate  800 mg Per Tube TID WC  . sodium chloride flush  10-40 mL Intracatheter Q12H  . tacrolimus  5 mg Per Tube BID  . vitamin B-12  1,000 mcg Per Tube Daily   fentaNYL, hydrocortisone cream, ipratropium-albuterol, senna, sodium chloride flush, witch hazel-glycerin  Assessment/ Plan:   Acute kidney injury.    Baseline creatinine widely fluctuating between 2.5 and 3.5.  Renal ultrasound performed 07/06/2020 was essentially unremarkable.  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  It is of note that she has been diagnosed with worsening heart failure.  Recent worsening of kidney dysfunction could be related to Covid as well as possible cardiorenal syndrome.    Continue supportive care  Labs essentially  unchanged  Nearing need for HD for clearance   Torsemide 80 mg is home regimen per charting  - try same    Anemia CKD.  Now appears to have some pancytopenia.  Hematology was called per charting.  Bone marrow biopsy performed on 2/18.  Continue ESA therapy. would recommend transfusion as needed. Increase aranesp 100 mcg every sat  Immunosuppression in the face of Covid pneumonia severe infection and pancytopenia.    Holding CellCept at this time. Continue Prograf and steroids Prograf dose 5 mg twice daily. Can restart cellcept as she improves  Covid pneumonia as per primary service  Hypertension -actually on low dose levo now  Hypocalcemia continue vitamin D and calcium. On calcitriol. Note this was increased and may need to be decreased pending her course.  Continue IV repletion as ordered.  May be related to renal failure.  PTH ordered.   Hypokalemia, hypomag - repleted this AM; ordered mag   Claudia Desanctis, MD 08/30/2020 8:25 AM

## 2020-08-30 NOTE — Progress Notes (Signed)
PT Cancellation Note  Patient Details Name: [name] MRN: [id number] DOB: [birth year]   Cancelled Treatment:    Reason Eval/Treat Not Completed: Medical issues which prohibited therapy  Patient has been transferred to ICU and intubated since evaluated by PT 08/27/20. She currently is too sedated to partcipate (and quickly became "wild" when sedation lightened per RN).   Will follow for appropriateness to resume PT.   Arby Barrette, PT Pager [phone]  Rexanne Mano 08/30/2020, 11:00 AM

## 2020-08-31 ENCOUNTER — Inpatient Hospital Stay (HOSPITAL_COMMUNITY): Payer: Medicare Other

## 2020-08-31 DIAGNOSIS — J9601 Acute respiratory failure with hypoxia: Secondary | ICD-10-CM | POA: Diagnosis not present

## 2020-08-31 LAB — PROTIME-INR
INR: 1.2 (ref 0.8–1.2)
Prothrombin Time: 15 seconds (ref 11.4–15.2)

## 2020-08-31 LAB — TRIGLYCERIDES: Triglycerides: 90 mg/dL (ref <150)

## 2020-08-31 LAB — CBC
HCT: 26.2 % — ABNORMAL LOW (ref 36.0–46.0)
Hemoglobin: 8.1 g/dL — ABNORMAL LOW (ref 12.0–15.0)
MCH: 29.1 pg (ref 26.0–34.0)
MCHC: 30.9 g/dL (ref 30.0–36.0)
MCV: 94.2 fL (ref 80.0–100.0)
Platelets: 133 10*3/uL — ABNORMAL LOW (ref 150–400)
RBC: 2.78 MIL/uL — ABNORMAL LOW (ref 3.87–5.11)
RDW: 18.4 % — ABNORMAL HIGH (ref 11.5–15.5)
WBC: 7.6 10*3/uL (ref 4.0–10.5)
nRBC: 0 % (ref 0.0–0.2)

## 2020-08-31 LAB — MAGNESIUM: Magnesium: 2.2 mg/dL (ref 1.7–2.4)

## 2020-08-31 LAB — GLUCOSE, CAPILLARY
Glucose-Capillary: 202 mg/dL — ABNORMAL HIGH (ref 70–99)
Glucose-Capillary: 219 mg/dL — ABNORMAL HIGH (ref 70–99)
Glucose-Capillary: 219 mg/dL — ABNORMAL HIGH (ref 70–99)
Glucose-Capillary: 205 mg/dL — ABNORMAL HIGH (ref 70–99)
Glucose-Capillary: 198 mg/dL — ABNORMAL HIGH (ref 70–99)

## 2020-08-31 LAB — BASIC METABOLIC PANEL
Anion gap: 16 — ABNORMAL HIGH (ref 5–15)
BUN: 97 mg/dL — ABNORMAL HIGH (ref 6–20)
CO2: 20 mmol/L — ABNORMAL LOW (ref 22–32)
Calcium: 6.5 mg/dL — ABNORMAL LOW (ref 8.9–10.3)
Chloride: 101 mmol/L (ref 98–111)
Creatinine, Ser: 3.85 mg/dL — ABNORMAL HIGH (ref 0.44–1.00)
GFR, Estimated: 13 mL/min — ABNORMAL LOW (ref >60)
Glucose, Bld: 218 mg/dL — ABNORMAL HIGH (ref 70–99)
Potassium: 4.7 mmol/L (ref 3.5–5.1)
Sodium: 137 mmol/L (ref 135–145)

## 2020-08-31 LAB — PHOSPHORUS: Phosphorus: 5.7 mg/dL — ABNORMAL HIGH (ref 2.5–4.6)

## 2020-08-31 LAB — HEPARIN LEVEL (UNFRACTIONATED): Heparin Unfractionated: 0.41 IU/mL (ref 0.30–0.70)

## 2020-08-31 MED ORDER — HEPARIN SODIUM (PORCINE) 1000 UNIT/ML DIALYSIS
6000.0000 [IU] | INTRAMUSCULAR | Status: DC | PRN
  Administered 2020-09-01: 3200 [IU] via INTRAVENOUS
  Filled 2020-08-31 (×2): qty 6

## 2020-08-31 MED ORDER — METOCLOPRAMIDE HCL 5 MG/ML IJ SOLN
10.0000 mg | Freq: Three times a day (TID) | INTRAMUSCULAR | Status: DC
  Administered 2020-08-31 – 2020-09-01 (×4): 10 mg via INTRAVENOUS
  Filled 2020-08-31 (×4): qty 2

## 2020-08-31 MED ORDER — CHLORHEXIDINE GLUCONATE CLOTH 2 % EX PADS
6.0000 | MEDICATED_PAD | Freq: Every day | CUTANEOUS | Status: DC
  Administered 2020-08-31 – 2020-09-01 (×2): 6 via TOPICAL

## 2020-08-31 NOTE — Progress Notes (Signed)
ANTICOAGULATION CONSULT NOTE - Follow Up Consult  Pharmacy Consult for heparin Indication: h/o VTE and PAF  Labs: Recent Labs    08/28/20 2041 08/29/20 0400 08/29/20 0415 08/29/20 1300 08/29/20 2200 08/30/20 0351 08/30/20 0700 08/30/20 0804 08/30/20 1713 08/31/20 0501  HGB 8.0*  --  7.7*  --   --  8.2*  --   --   --  8.1*  HCT 25.0*  --  23.0*  --   --  25.2*  --   --   --  26.2*  PLT  --   --  125*  --   --  156  --   --   --  133*  APTT  --    < >  --    < > 65*  --   --  53* 58*  --   LABPROT  --   --  16.7*  --   --  15.7*  --   --   --  15.0  INR  --   --  1.4*  --   --  1.3*  --   --   --  1.2  HEPARINUNFRC  --    < >  --   --   --   --  0.70  --  0.50 0.41  CREATININE 3.82*  --  3.89*  --   --  3.89*  --   --   --   --    < > = values in this interval not displayed.    Assessment: 55 yo female on heparin infusion who takes Eliquis 5mg  BID PTA for afib. 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 Her last dose of Eliquis was given on 2/20 @0802   Hep lvl 0.41 units/ml  Goal of Therapy:  Heparin level 0.3-0.7 units/ml   Plan:  Continue heparin gtt at 750 units/hr Daily hep lvl cbc  Thanks for allowing pharmacy to be a part of this patient's care.  Excell Seltzer, PharmD Clinical Pharmacist   08/31/2020 6:12 AM

## 2020-08-31 NOTE — Progress Notes (Signed)
[facility] Progress Note Patient Name: [name] DOB: 1966-06-09 MRN: [id number]   Date of Service  08/31/2020  HPI/Events of Note  KUB earlier today showed mild ileus, patient is asymptomatic and receiving scheduled Reglan.  eICU Interventions  Bedside RN instructed that it is okay to continue to give her medications via enteral feeding tube.        Kerry Kass Aryav Wimberly 08/31/2020, 10:12 PM

## 2020-08-31 NOTE — Progress Notes (Signed)
OT Cancellation Note  Patient Details Name: [name] MRN: [id number] DOB: [birth year]   Cancelled Treatment:    Reason Eval/Treat Not Completed: (P) Patient not medically ready (intubated/sedated)  WARD,HILLARY 08/31/2020, 8:47 AM Maurie Boettcher, OT/L   Acute OT Clinical Specialist Acute Rehabilitation Services Pager [phone] Office [phone]

## 2020-08-31 NOTE — Procedures (Signed)
Central Venous Catheter Insertion Procedure Note  [name]  [id number]  [birth year]  Date:08/31/20  Time:4:30 PM   Provider Performing:Mallorie Norrod Alfredo Martinez, NP-C, AGACNP-BC  Procedure: Insertion of Non-tunneled Central Venous Catheter(36556)with US guidance (92957)    Indication(s) Hemodialysis  Consent Risks of the procedure as well as the alternatives and risks of each were explained to the patient and/or caregiver.  Consent for the procedure was obtained and is signed in the bedside chart  Anesthesia Topical only with 1% lidocaine   Timeout Verified patient identification, verified procedure, site/side was marked, verified correct patient position, special equipment/implants available, medications/allergies/relevant history reviewed, required imaging and test results available.  Sterile Technique Maximal sterile technique including full sterile barrier drape, hand hygiene, sterile gown, sterile gloves, mask, hair covering, sterile ultrasound probe cover (if used).  Procedure Description Area of catheter insertion was cleaned with chlorhexidine and draped in sterile fashion.   With real-time ultrasound guidance a HD catheter was placed into the right femoral vein.  Nonpulsatile blood flow and easy flushing noted in all ports.  The catheter was sutured in place and sterile dressing applied.  Complications/Tolerance None; patient tolerated the procedure well.   Chest X-ray is ordered to verify placement for internal jugular or subclavian cannulation.  Chest x-ray is not ordered for femoral cannulation.  EBL Minimal  Specimen(s) None   Tracey Gens, MSN, APRN, NP-C, AGACNP-BC [facility] 08/31/2020, 4:30 PM   Please see [email or web address] for pager details.   From 7A-7P if no response, please call [phone] After hours, please call ELink [phone]

## 2020-08-31 NOTE — Progress Notes (Signed)
[facility] ROUNDING NOTE   Subjective:    had 455 mL UOP over 2/23. on sedation.  She's hasn't been following commands which has delayed her extubaton; has been agitated requiring sedation.  She's on levo at 1 mcg/min.  Spoke with daughter via phone as below   Review of systems:    Unable to obtain 2/2 intubated and sedated  ------------------ Brief History:  55 year old lady with a history of renal transplant cadaveric 2011 appears like a baseline creatinine about 2.5 mg/dL based on notes from office.  However during her hospitalizations in January it looks like her creatinine has gradually been worsening.  On 08/06/2020 it did increase to 3.66 mg/dL. she has a history of hypertension and congestive heart failure with a diminished ejection fraction of about 30%.  Atrial fibrillation and thrombophilia with an antithrombin III deficiency she presented 08/08/2020 with shortness of breath.  She was diagnosed with COVID-19 pneumonia 08/20/2020.  She has been admitted for Decadron and close monitoring. she has been evaluated by hematology and plans will be for a bone marrow biopsy as an inpatient.  Most recent hospitalizations #1 07/04/2020  -1/47/8295 complications of acute on chronic heart failure with a creatinine that had increased during hospitalization. Most recent hospitalizations #2   08/01/2020 -08/06/2020 with worsening anemia.   Objective:  Vital signs in last 24 hours:  Temp:  [95.9 F (35.5 C)-98.6 F (37 C)] 95.9 F (35.5 C) (02/24 0700) Pulse Rate:  [54-72] 54 (02/24 0700) Resp:  [9-28] 28 (02/24 0700) BP: (122)/(52) 122/52 (02/24 0700) SpO2:  [95 %-100 %] 100 % (02/24 0745) Arterial Line BP: (106-159)/(37-61) 115/52 (02/24 0600) FiO2 (%):  [30 %] 30 % (02/24 0745) Weight:  [120 kg] 120 kg (02/24 0500)  Weight change: 0.5 kg Filed Weights   08/29/20 0410 08/30/20 0500 08/31/20 0500  Weight: 117.4 kg 119.5 kg 120 kg    Intake/Output: I/O last 3 completed  shifts: In: 4239.8 [I.V.:2275; NG/GT:1860; IV Piggyback:104.7] Out: 69 [Urine:715; Emesis/NG output:90]   Intake/Output this shift:  Total I/O In: -  Out: 67 [Urine:15]  General adult female in bed critically ill    HEENT normocephalic atraumatic  Neck normal circumference trachea midline Lungs clear but reduced on resp auscultation; 30 FIO2 and 5 peep Heart S1S2 no rub Abdomen soft nontender nondistended Extremities 2+ edema  Neuro sedation continuously running Psych calm on sedation Access - evidence of prior accesses but no bruit or thrill appreciated  Basic Metabolic Panel: Recent Labs  Lab 08/27/20 0608 08/27/20 2018 08/27/20 2212 08/27/20 2357 08/28/20 0445 08/28/20 2041 08/29/20 0415 08/29/20 1751 08/30/20 0351 08/30/20 1713 08/31/20 0501  NA 142   < > 138   < > 142 141 139  --  140  --  137  K 3.9   < > 3.0*   < > 3.1* 3.7 3.4*  --  4.0  --  4.7  CL 103  --  102  --  106 104 103  --  103  --  101  CO2 21*  --  18*  --  20* 20* 20*  --  21*  --  20*  GLUCOSE 179*  --  178*  --  132* 139* 158*  --  199*  --  218*  BUN 90*  --  92*  --  92* 93* 94*  --  94*  --  97*  CREATININE 3.95*  --  3.88*  --  3.94* 3.82* 3.89*  --  3.89*  --  3.85*  CALCIUM 6.2*  --  5.9*  --  6.0* 6.3* 6.3*  --  6.2*  --  6.5*  MG  --   --  1.6*  --   --   --   --  2.0 1.9 2.1 2.2  PHOS 5.2*  --   --   --   --   --   --  5.0* 5.3* 5.1* 5.7*   < > = values in this interval not displayed.    Liver Function Tests: Recent Labs  Lab 08/25/20 0150 08/27/20 0608  AST 22  --   ALT 16  --   ALKPHOS 403*  --   BILITOT 0.5  --   PROT 5.3*  --   ALBUMIN 3.0* 3.1*   CBC: Recent Labs  Lab 08/25/20 1158 08/26/20 0245 08/27/20 2212 08/27/20 2357 08/28/20 0445 08/28/20 1121 08/28/20 2041 08/29/20 0415 08/30/20 0351 08/31/20 0501  WBC 2.8*   < > 5.3  --  3.7*  --   --  2.2* 7.4 7.6  NEUTROABS 2.2  --  4.5  --   --   --   --   --   --   --   HGB 7.8*   < > 7.0*   < > 6.1* 7.2* 8.0*  7.7* 8.2* 8.1*  HCT 24.8*   < > 23.0*   < > 20.5* 22.8* 25.0* 23.0* 25.2* 26.2*  MCV 94.7   < > 96.2  --  97.2  --   --  88.5 91.0 94.2  PLT 109*   < > 138*  --  108*  --   --  125* 156 133*   < > = values in this interval not displayed.    Microbiology: Results for orders placed or performed during the hospital encounter of 09/04/2020  Resp Panel by RT-PCR (Flu A&B, Covid) Nasopharyngeal Swab     Status: Abnormal   Collection Time: 08/10/2020  9:51 AM   Specimen: Nasopharyngeal Swab; Nasopharyngeal(NP) swabs in vial transport medium  Result Value Ref Range Status   SARS Coronavirus 2 by RT PCR POSITIVE (A) NEGATIVE Final    Comment: RESULT CALLED TO, READ BACK BY AND VERIFIED WITH: RN MISSY E. [phone] (NOTE) SARS-CoV-2 target nucleic acids are DETECTED.  The SARS-CoV-2 RNA is generally detectable in upper respiratory specimens during the acute phase of infection. Positive results are indicative of the presence of the identified virus, but do not rule out bacterial infection or co-infection with other pathogens not detected by the test. Clinical correlation with patient history and other diagnostic information is necessary to determine patient infection status. The expected result is Negative.  Fact Sheet for Patients: EntrepreneurPulse.com.au  Fact Sheet for Healthcare Providers: IncredibleEmployment.be  This test is not yet approved or cleared by the [place] FDA and  has been authorized for detection and/or diagnosis of SARS-CoV-2 by FDA under an Emergency Use Authorization (EUA).  This EUA will remain in effect (meaning this test can be used) for  the duration of  the COVID-19 declaration under Section 564(b)(1) of the Act, 21 U.S.C. section 360bbb-3(b)(1), unless the authorization is terminated or revoked sooner.     Influenza A by PCR NEGATIVE NEGATIVE Final   Influenza B by PCR NEGATIVE NEGATIVE Final    Comment: (NOTE) The  Xpert Xpress SARS-CoV-2/FLU/RSV plus assay is intended as an aid in the diagnosis of influenza from Nasopharyngeal swab specimens and should not be used as a sole basis for treatment.  Nasal washings and aspirates are unacceptable for Xpert Xpress SARS-CoV-2/FLU/RSV testing.  Fact Sheet for Patients: EntrepreneurPulse.com.au  Fact Sheet for Healthcare Providers: IncredibleEmployment.be  This test is not yet approved or cleared by the [place] FDA and has been authorized for detection and/or diagnosis of SARS-CoV-2 by FDA under an Emergency Use Authorization (EUA). This EUA will remain in effect (meaning this test can be used) for the duration of the COVID-19 declaration under Section 564(b)(1) of the Act, 21 U.S.C. section 360bbb-3(b)(1), unless the authorization is terminated or revoked.  Performed at [facility], [place] [address] [place], [place] [address]   MRSA PCR Screening     Status: None   Collection Time: 08/10/2020 11:16 PM   Specimen: Nasal Mucosa; Nasopharyngeal  Result Value Ref Range Status   MRSA by PCR NEGATIVE NEGATIVE Final    Comment:        The GeneXpert MRSA Assay (FDA approved for NASAL specimens only), is one component of a comprehensive MRSA colonization surveillance program. It is not intended to diagnose MRSA infection nor to guide or monitor treatment for MRSA infections. Performed at [facility], [place] [address] [place], [place] [address]   Culture, blood (routine x 2)     Status: None (Preliminary result)   Collection Time: 08/27/20 10:54 PM   Specimen: BLOOD RIGHT HAND  Result Value Ref Range Status   Specimen Description BLOOD RIGHT HAND  Final   Special Requests   Final    BOTTLES DRAWN AEROBIC ONLY Blood Culture results may not be optimal due to an inadequate volume of blood received in culture bottles   Culture   Final    NO GROWTH 4 DAYS Performed at [facility], [place]  [address] [place], [place] [address]    Report Status PENDING  Incomplete  Culture, blood (routine x 2)     Status: None (Preliminary result)   Collection Time: 08/27/20 10:58 PM   Specimen: BLOOD RIGHT ARM  Result Value Ref Range Status   Specimen Description BLOOD RIGHT ARM  Final   Special Requests   Final    BOTTLES DRAWN AEROBIC ONLY Blood Culture results may not be optimal due to an inadequate volume of blood received in culture bottles   Culture   Final    NO GROWTH 4 DAYS Performed at [facility], [place] [address] [place], [place] [address]    Report Status PENDING  Incomplete  Culture, Respiratory w Gram Stain     Status: None   Collection Time: 08/28/20  1:57 AM   Specimen: Tracheal Aspirate; Respiratory  Result Value Ref Range Status   Specimen Description TRACHEAL ASPIRATE  Final   Special Requests NONE  Final   Gram Stain   Final    FEW WBC PRESENT,BOTH PMN AND MONONUCLEAR FEW GRAM NEGATIVE RODS RARE GRAM POSITIVE COCCI IN CHAINS    Culture   Final    RARE KLEBSIELLA PNEUMONIAE FEW HAEMOPHILUS PARAHAEMOLYTICUS BETA LACTAMASE NEGATIVE Performed at [facility], [address], [place], [place] [address]    Report Status 08/30/2020 FINAL  Final   Organism ID, Bacteria KLEBSIELLA PNEUMONIAE  Final      Susceptibility   Klebsiella pneumoniae - MIC*    AMPICILLIN >=32 RESISTANT Resistant     CEFAZOLIN <=4 SENSITIVE Sensitive     CEFEPIME <=0.12 SENSITIVE Sensitive     CEFTAZIDIME <=1 SENSITIVE Sensitive     CEFTRIAXONE <=0.25 SENSITIVE Sensitive  CIPROFLOXACIN <=0.25 SENSITIVE Sensitive     GENTAMICIN <=1 SENSITIVE Sensitive     IMIPENEM <=0.25 SENSITIVE Sensitive     TRIMETH/SULFA <=20 SENSITIVE Sensitive     AMPICILLIN/SULBACTAM 4 SENSITIVE Sensitive     PIP/TAZO <=4 SENSITIVE Sensitive     * RARE KLEBSIELLA PNEUMONIAE    Urinalysis: No results for input(s): COLORURINE, LABSPEC, PHURINE, GLUCOSEU, HGBUR, BILIRUBINUR, KETONESUR, PROTEINUR,  UROBILINOGEN, NITRITE, LEUKOCYTESUR in the last 72 hours.  Invalid input(s): APPERANCEUR    Imaging: DG CHEST PORT 1 VIEW  Result Date: 08/31/2020 CLINICAL DATA:  Pulmonary edema. EXAM: PORTABLE CHEST 1 VIEW COMPARISON:  08/28/2020. FINDINGS: Endotracheal tube, NG tube, left IJ line stable position. Heart size normal. Low lung volumes with bibasilar atelectasis. Diffuse bilateral pulmonary infiltrates/edema again noted without interim change. Moderate right pleural effusion. Small left pleural effusion. Similar findings noted on prior exam. No pneumothorax. IMPRESSION: 1. Lines and tubes in stable position. 2. Low lung volumes with bibasilar atelectasis. Diffuse bilateral pulmonary infiltrates/edema again noted without interim change. Moderate right pleural effusion. Small left pleural effusion. Electronically Signed   By: [place]   On: 08/31/2020 05:12     Medications:   . feeding supplement (VITAL 1.5 CAL) 1,000 mL (08/31/20 0759)  . fentaNYL infusion INTRAVENOUS 125 mcg/hr (08/31/20 0600)  . heparin 750 Units/hr (08/31/20 0600)  . norepinephrine (LEVOPHED) Adult infusion 2 mcg/min (08/31/20 0600)  . propofol (DIPRIVAN) infusion 60 mcg/kg/min (08/31/20 0743)   . amiodarone  200 mg Per Tube Daily  . atorvastatin  40 mg Per Tube Daily  . calcitRIOL  4 mcg Per Tube BID  . calcium-vitamin D  1 tablet Per Tube TID  . chlorhexidine gluconate (MEDLINE KIT)  15 mL Mouth Rinse BID  . Chlorhexidine Gluconate Cloth  6 each Topical Daily  . Chlorhexidine Gluconate Cloth  6 each Topical Daily  . [START ON 09/02/2020] Darbepoetin Alfa  100 mcg Subcutaneous Q Sat-1800  . dexamethasone  6 mg Per Tube Q24H  . docusate  100 mg Per Tube BID  . feeding supplement (PROSource TF)  45 mL Per Tube BID  . insulin aspart  0-9 Units Subcutaneous Q4H  . isosorbide-hydrALAZINE  1.5 tablet Per Tube TID  . mouth rinse  15 mL Mouth Rinse 10 times per day  . pantoprazole sodium  40 mg Per Tube Daily  .  polyethylene glycol  17 g Per Tube Daily  . sevelamer carbonate  800 mg Per Tube TID WC  . sodium chloride flush  10-40 mL Intracatheter Q12H  . tacrolimus  5 mg Per Tube BID  . vitamin B-12  1,000 mcg Per Tube Daily   fentaNYL, hydrocortisone cream, ipratropium-albuterol, senna, sodium chloride flush, witch hazel-glycerin  Assessment/ Plan:   Acute kidney injury on CKD.   Baseline creatinine widely fluctuating between 2.5 and 3.5.  Renal ultrasound performed 07/06/2020 was essentially unremarkable.  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  May have had progression of CKD progression vs AKI from covid.  Recent worsening of kidney dysfunction could be related to Covid as well as possible cardiorenal syndrome as worsening HF.    Start dialysis; spoke with her daughter Benjaman Lobe via phone about risks/benefits/indications for dialysis.      HD today and assess needs daily   Spoke with critical care and appreciate line placement- nontunneled catheter  Anemia CKD.  Now appears to have some  pancytopenia.  Hematology was called per charting.  Bone marrow biopsy performed on 2/18.  Continue ESA therapy. would recommend transfusion as needed. Increase aranesp to 100 mcg every sat  Immunosuppression in the face of Covid pneumonia severe infection and pancytopenia.    Holding CellCept at this time. Continue Prograf and steroids Prograf dose 5 mg twice daily. Can restart cellcept as she improves  Covid pneumonia as per primary service  Hypertension -actually on low dose levo now likely related to sedation  Hypocalcemia continue vitamin D and calcium. On calcitriol. Note this was increased and may need to be decreased pending her course.  Continue IV repletion as ordered.  Hypocalcemia may be related to renal failure.  PTH 1073 on 2/22 - improved from prior of 1548 on 08/03/20.   Hypokalemia, hypomag - repleted   Claudia Desanctis, MD 08/31/2020 8:56 AM

## 2020-08-31 NOTE — Significant Event (Signed)
[place] assessment of R IJ demonstrated a clot in the small to moderate sized clot in internal jugular.  Not a candidate for cannulation of right IJ.      Noe Gens, MSN, APRN, NP-C, AGACNP-BC [facility] 08/31/2020, 4:32 PM   Please see [email or web address] for pager details.   From 7A-7P if no response, please call [phone] After hours, please call ELink [phone]

## 2020-08-31 NOTE — Progress Notes (Signed)
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  Had bone marrow biopsy 2/18.  Had rapid deterioration on the floor today, 2/20, where she had increasing oxygen requirements, and severe agitation (was hypertensive) requiring transfer to ICU.   History of Present Illness:  Patient was initially admitted for pancytopenia on 2/15 with a hemoglobin of 6.1, hypocalcemia, in the setting of COVID-19.  For her pancytopenia, she had a bone marrow biopsy on 2/18, but this has been thought to be related to chronic disease. She has been diuresed, with fluctuating creatinine (nephro on board) but good urine output.  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   She is also getting decadron $RemoveBeforeDE'6mg'TjwEqHlfhoZIvcC$  for COVID-19 pneumonia.  For her hypocalcemia, due to her CKD, she is getting vitamin D repletion, calcitriol, and as needed IV calcium replacement   She was initially on 4L of O2 for her stay until this morning, when she had increasing O2 requirements and agitated delirium.  Also noted to be hypertensive during this episode and had an episode of hemoptysis along with increasing O2 requirements.    She was very agitated on arrival to ICU on 2/20, was on nonrebreather mask, and could not get an accurate pulse ox.  ABG showed respiratory acidosis (pre-intubation).  Patient was urgently intubated due to respiratory failure.     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 CHF (EF 20-25%) RUE DVT DM HTN  Significant Hospital Events:  Bone marrow  biopsy 2/18 Intubation for acute hypoxic and hypercarbic respiratory failure 2/20  Consults:  Nephrology  Procedures:  BM biopsy 2/18 Intubation 2/20 L IJ CVC placed 2/20- in subclavian vein  Significant Diagnostic Tests:  CXR 2/20: increased bilateral alveolar infiltrates 2/18 bone marrow bx: (sent for [facility] analysis for MDS) insufficient sample for flow cytometric analysis  Micro Data:  2/15 SARS Cov2 POSITIVE 2/15 MRSA screen NEGATIVE 2/20 blood cultures >> ngtd 2/20 respiratory cultures>>  Antimicrobials:     Interim History / Subjective:  2/24: sedated rass -3, going to start dialysis today, diminished uop and inability to appropriately awaken consistently and wean from ventilator.  2/23: improved mental status, labored breathing on sbt 2/22: renal function stable today . Remains off cellcept. Mental status remains poor 2/21: remains sedated, renal insufficiency cont to worsen by indices but uop good. . hgb remains low and platelets cont to fall. Echo without rhs and stable from 12/21, le dopplers pending with RLE >>LLE  In size. On minimal vent settings, pt moves but does not follow commands. Transfusing one unit blood for hgb 6.1. no overt bleeding noted.   Objective   Blood pressure (!) 122/52, pulse 60, temperature (!) 95.72 F (35.4 C), resp. rate (!) 22, height $RemoveBe'5\' 11"'KmVkWUihe$  (1.803 m), weight 120 kg, last menstrual period 11/05/2012, SpO2 100 %.    Vent Mode: PSV FiO2 (%):  [30 %] 30 % Set Rate:  [28 bmp] 28 bmp Vt Set:  [420 mL] 420 mL PEEP:  [5 cmH20] 5 cmH20 Pressure Support:  [5 cmH20] 5 cmH20  Plateau Pressure:  [20 cmH20-22 cmH20] 22 cmH20   Intake/Output Summary (Last 24 hours) at 08/31/2020 1205 Last data filed at 08/31/2020 1000 Gross per 24 hour  Intake 2949.78 ml  Output 360 ml  Net 2589.78 ml   Filed Weights   08/29/20 0410 08/30/20 0500 08/31/20 0500  Weight: 117.4 kg 119.5 kg 120 kg    Examination: General: rass -3 sedated and unresponsive on  vent HENT: NCAT, eyes, equal and round, mmmp Lungs:rhonchi bilaterally Cardiovascular: regular rate and rhythm Abdomen: obese soft, nontender, nondistended Extremities: pitting edema b/l R >>L le Neuro:sedated unresponsive on vent   Resolved Hospital Problem list     Assessment & Plan:  55 yo F with  ATIII deficiency, Afib, 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  Had acute decompensation on 2/20 with increasing O2 requirements, agitation, and acute hypoxic and hypercarbic respiratory failure requiring intubation.    # Acute hypoxic and hypercarbic respiratory failure # COVID-19 # CHF exacerbation intubated 2/20 -possible covid worsening  vs pulm edema  - also PE is of consideration, her AC had been pasued for the BM biopsy ( ), but otherwise she has been on anticoagulation.  Will hold on a CTA PE for now in light of her poor kidney function, but will consider if does not improve with diuresis. (less likely in setting of stable echo without rhs, and negative le venous doppler) Plan: - ventilation with goal of 6cc/kg - diuresis with goal at least 1-2 L net negative - continue decadron $RemoveBeforeDEI'6mg'XhfkLftOpZajCmQK$  daily for COVID-19 treatment - troponin and EKG - holding eliquis--> heparin gtt (will hold for vas cath placement) - le venous doppler negative    # Acute encephalopathy: Agitated delirium now resolved s/p intubation and sedation.  Agitation likely precipitated by worsening hypoxia on the floor.  - daily spontaneous awaking trials -deeply sedated. Slowly wean. Will attempt dialysis to clear bun  # Pancytopenia: s/p bone marrow biopsy 2/18.  Likely from chronic illness, and advanced renal disease - follow up BM bx results (sent for [facility] analysis for MDS) insufficient sample for flow cytometric analysis - trend CBC - no need for trasnfusion at this time  # CKD stage  V # Hypocalcemia Currently has good UOP with diuresis.  - appreciate nephrology recs - BMP now  # Hypertension: currenly on bidil, amiodarone, and toprol XL - holding metoprolol while getting sedation.  Can restart if BP tolerates - continue bidil and amiodarone for now  # pAFib: in sinus now - continue amiodarone   # AT III deficiency # RUE DVT - continue anticoagulation.  -cont heparin  Best practice (evaluated daily)  Diet: NPO Pain/Anxiety/Delirium protocol (if indicated): yes VAP protocol (if indicated): yes DVT prophylaxis: heparin gtt GI prophylaxis: ppi  Glucose control: SSI q4H Mobility: n/a Disposition:ICU  Goals of Care:  Last date of multidisciplinary goals of care discussion: daughter via phone 2/23 Family and staff present: via phone Summary of discussion: cont with current goals Follow up goals of care discussion due: 5 days Code Status: Full  Labs   CBC: Recent Labs  Lab 08/25/20 1158 08/26/20 0245 08/27/20 2212 08/27/20 2357 08/28/20 0445 08/28/20 1121 08/28/20 2041 08/29/20 0415 08/30/20 0351 08/31/20 0501  WBC 2.8*   < > 5.3  --  3.7*  --   --  2.2* 7.4 7.6  NEUTROABS 2.2  --  4.5  --   --   --   --   --   --   --  HGB 7.8*   < > 7.0*   < > 6.1* 7.2* 8.0* 7.7* 8.2* 8.1*  HCT 24.8*   < > 23.0*   < > 20.5* 22.8* 25.0* 23.0* 25.2* 26.2*  MCV 94.7   < > 96.2  --  97.2  --   --  88.5 91.0 94.2  PLT 109*   < > 138*  --  108*  --   --  125* 156 133*   < > = values in this interval not displayed.    Basic Metabolic Panel: Recent Labs  Lab 08/27/20 0608 08/27/20 2018 08/27/20 2212 08/27/20 2357 08/28/20 0445 08/28/20 2041 08/29/20 0415 08/29/20 1751 08/30/20 0351 08/30/20 1713 08/31/20 0501  NA 142   < > 138   < > 142 141 139  --  140  --  137  K 3.9   < > 3.0*   < > 3.1* 3.7 3.4*  --  4.0  --  4.7  CL 103  --  102  --  106 104 103  --  103  --  101  CO2 21*  --  18*  --  20* 20* 20*  --  21*  --  20*  GLUCOSE 179*  --  178*  --   132* 139* 158*  --  199*  --  218*  BUN 90*  --  92*  --  92* 93* 94*  --  94*  --  97*  CREATININE 3.95*  --  3.88*  --  3.94* 3.82* 3.89*  --  3.89*  --  3.85*  CALCIUM 6.2*  --  5.9*  --  6.0* 6.3* 6.3*  --  6.2*  --  6.5*  MG  --   --  1.6*  --   --   --   --  2.0 1.9 2.1 2.2  PHOS 5.2*  --   --   --   --   --   --  5.0* 5.3* 5.1* 5.7*   < > = values in this interval not displayed.   GFR: Estimated Creatinine Clearance: 23.3 mL/min (A) (by C-G formula based on SCr of 3.85 mg/dL (H)). Recent Labs  Lab 08/27/20 2212 08/28/20 0445 08/28/20 0644 08/29/20 0415 08/30/20 0351 08/31/20 0501  WBC 5.3 3.7*  --  2.2* 7.4 7.6  LATICACIDVEN 1.5  --  1.3  --   --   --     Liver Function Tests: Recent Labs  Lab 08/25/20 0150 08/27/20 0608  AST 22  --   ALT 16  --   ALKPHOS 403*  --   BILITOT 0.5  --   PROT 5.3*  --   ALBUMIN 3.0* 3.1*   No results for input(s): LIPASE, AMYLASE in the last 168 hours. No results for input(s): AMMONIA in the last 168 hours.  ABG    Component Value Date/Time   PHART 7.408 08/27/2020 2357   PCO2ART 38.0 08/27/2020 2357   PO2ART 105 08/27/2020 2357   HCO3 23.9 08/27/2020 2357   TCO2 25 08/27/2020 2357   ACIDBASEDEF 1.0 08/27/2020 2357   O2SAT 98.0 08/27/2020 2357     Coagulation Profile: Recent Labs  Lab 08/27/20 2212 08/28/20 0445 08/29/20 0415 08/30/20 0351 08/31/20 0501  INR 1.6* 1.6* 1.4* 1.3* 1.2    Cardiac Enzymes: No results for input(s): CKTOTAL, CKMB, CKMBINDEX, TROPONINI in the last 168 hours.  HbA1C: Hemoglobin A1C  Date/Time Value Ref Range Status  12/02/2017 12:00 AM 4.8  Final  08/04/2017  12:00 AM 5.3  Final   Hgb A1c MFr Bld  Date/Time Value Ref Range Status  07/05/2020 05:32 AM 5.6 4.8 - 5.6 % Final    Comment:    (NOTE) Pre diabetes:          5.7%-6.4%  Diabetes:              >6.4%  Glycemic control for   <7.0% adults with diabetes   03/14/2020 10:15 PM 5.0 4.8 - 5.6 % Final    Comment:    (NOTE) Pre  diabetes:          5.7%-6.4%  Diabetes:              >6.4%  Glycemic control for   <7.0% adults with diabetes     CBG: Recent Labs  Lab 08/30/20 1536 08/30/20 1936 08/30/20 2337 08/31/20 0325 08/31/20 0722  GLUCAP 173* 219* 219* 202* 205*    Critical care time: The patient is critically ill with multiple organ systems failure and requires high complexity decision making for assessment and support, frequent evaluation and titration of therapies, application of advanced monitoring technologies and extensive interpretation of multiple databases.  Critical care time 38 mins. This represents my time independent of the NPs time taking care of the pt. This is excluding procedures.    Audria Nine DO [facility] 08/31/2020, 12:05 PM See Amion for pager If no response to pager, please call [phone] until 1900 After 1900 please call [facility] [phone]

## 2020-08-31 NOTE — Progress Notes (Signed)
PT Cancellation Note  Patient Details Name: [name] MRN: [id number] DOB: January 05, 1966   Cancelled Treatment:    Reason Eval/Treat Not Completed: Patient not medically ready   Maija B Prater 08/31/2020, 8:44 AM  Bayard Males, PT Acute Rehabilitation Services Pager: [phone] Office: [phone]

## 2020-08-31 NOTE — Progress Notes (Signed)
Inpatient Diabetes Program Recommendations  AACE/ADA: New Consensus Statement on Inpatient Glycemic Control (2015)  Target Ranges:  Prepandial:   less than 140 mg/dL      Peak postprandial:   less than 180 mg/dL (1-2 hours)      Critically ill patients:  140 - 180 mg/dL   Lab Results  Component Value Date   GLUCAP 205 (H) 08/31/2020   HGBA1C 5.6 07/05/2020    Review of Glycemic Control Results for [name], [name] (MRN [id number]) as of 08/31/2020 11:04  Ref. Range 08/30/2020 07:21 08/30/2020 12:28 08/30/2020 15:36 08/30/2020 19:36 08/30/2020 23:37 08/31/2020 03:25 08/31/2020 07:22  Glucose-Capillary Latest Ref Range: 70 - 99 mg/dL 213 (H) 185 (H) 173 (H) 219 (H) 219 (H) 202 (H) 205 (H)   Diabetes history: DM 2 Outpatient Diabetes medications: Novolog 0-20 units tid Current orders for Inpatient glycemic control:  Novolog 0-9 units Q4 hours  Decadron 6 mg Q24 hours Vital 1.5 60 ml/hour  Inpatient Diabetes Program Recommendations:    Glucose Consistently in the 200 range. -  Start Novolog 2-3 units tid Tube Feed Coverage Q4 hours  Thanks, Tama Headings RN, MSN, BC-ADM Inpatient Diabetes Coordinator Team Pager [phone] (8a-5p)

## 2020-09-01 DIAGNOSIS — D6859 Other primary thrombophilia: Secondary | ICD-10-CM

## 2020-09-01 DIAGNOSIS — D61818 Other pancytopenia: Secondary | ICD-10-CM

## 2020-09-01 DIAGNOSIS — N179 Acute kidney failure, unspecified: Secondary | ICD-10-CM

## 2020-09-01 DIAGNOSIS — I480 Paroxysmal atrial fibrillation: Secondary | ICD-10-CM

## 2020-09-01 DIAGNOSIS — U071 COVID-19: Secondary | ICD-10-CM | POA: Diagnosis not present

## 2020-09-01 DIAGNOSIS — I5020 Unspecified systolic (congestive) heart failure: Secondary | ICD-10-CM

## 2020-09-01 DIAGNOSIS — D649 Anemia, unspecified: Secondary | ICD-10-CM | POA: Diagnosis not present

## 2020-09-01 LAB — BASIC METABOLIC PANEL
Anion gap: 15 (ref 5–15)
BUN: 96 mg/dL — ABNORMAL HIGH (ref 6–20)
CO2: 21 mmol/L — ABNORMAL LOW (ref 22–32)
Calcium: 6.8 mg/dL — ABNORMAL LOW (ref 8.9–10.3)
Chloride: 102 mmol/L (ref 98–111)
Creatinine, Ser: 4.11 mg/dL — ABNORMAL HIGH (ref 0.44–1.00)
GFR, Estimated: 12 mL/min — ABNORMAL LOW (ref >60)
Glucose, Bld: 80 mg/dL (ref 70–99)
Potassium: 4.5 mmol/L (ref 3.5–5.1)
Sodium: 138 mmol/L (ref 135–145)

## 2020-09-01 LAB — HEPARIN LEVEL (UNFRACTIONATED)
Heparin Unfractionated: 0.21 IU/mL — ABNORMAL LOW (ref 0.30–0.70)
Heparin Unfractionated: 0.27 IU/mL — ABNORMAL LOW (ref 0.30–0.70)

## 2020-09-01 LAB — CBC
HCT: 28.6 % — ABNORMAL LOW (ref 36.0–46.0)
Hemoglobin: 8.5 g/dL — ABNORMAL LOW (ref 12.0–15.0)
MCH: 28.6 pg (ref 26.0–34.0)
MCHC: 29.7 g/dL — ABNORMAL LOW (ref 30.0–36.0)
MCV: 96.3 fL (ref 80.0–100.0)
Platelets: 117 10*3/uL — ABNORMAL LOW (ref 150–400)
RBC: 2.97 MIL/uL — ABNORMAL LOW (ref 3.87–5.11)
RDW: 18.5 % — ABNORMAL HIGH (ref 11.5–15.5)
WBC: 2.8 10*3/uL — ABNORMAL LOW (ref 4.0–10.5)
nRBC: 1.1 % — ABNORMAL HIGH (ref 0.0–0.2)

## 2020-09-01 LAB — PROTIME-INR
INR: 1.2 (ref 0.8–1.2)
Prothrombin Time: 15.1 seconds (ref 11.4–15.2)

## 2020-09-01 LAB — CULTURE, BLOOD (ROUTINE X 2)
Culture: NO GROWTH
Culture: NO GROWTH

## 2020-09-01 LAB — GLUCOSE, CAPILLARY
Glucose-Capillary: 167 mg/dL — ABNORMAL HIGH (ref 70–99)
Glucose-Capillary: 165 mg/dL — ABNORMAL HIGH (ref 70–99)
Glucose-Capillary: 158 mg/dL — ABNORMAL HIGH (ref 70–99)
Glucose-Capillary: 79 mg/dL (ref 70–99)
Glucose-Capillary: 77 mg/dL (ref 70–99)
Glucose-Capillary: 142 mg/dL — ABNORMAL HIGH (ref 70–99)
Glucose-Capillary: 52 mg/dL — ABNORMAL LOW (ref 70–99)
Glucose-Capillary: 16 mg/dL — CL (ref 70–99)
Glucose-Capillary: 70 mg/dL (ref 70–99)

## 2020-09-01 MED ORDER — DEXTROSE 50 % IV SOLN
25.0000 g | INTRAVENOUS | Status: AC
  Administered 2020-09-01: 25 g via INTRAVENOUS

## 2020-09-01 MED ORDER — EPINEPHRINE 1 MG/10ML IJ SOSY: 1.0000 mg | PREFILLED_SYRINGE | Freq: Once | INTRAMUSCULAR | Status: AC

## 2020-09-01 MED ORDER — HYDROCORTISONE NA SUCCINATE PF 100 MG IJ SOLR
50.0000 mg | Freq: Four times a day (QID) | INTRAMUSCULAR | Status: DC
  Administered 2020-09-01: 50 mg via INTRAVENOUS
  Filled 2020-09-01: qty 2

## 2020-09-01 MED ORDER — HEPARIN SODIUM (PORCINE) 1000 UNIT/ML IJ SOLN
INTRAMUSCULAR | Status: AC
  Filled 2020-09-01: qty 4

## 2020-09-01 MED ORDER — DEXTROSE 50 % IV SOLN
INTRAVENOUS | Status: AC
  Administered 2020-09-01: 50 mL
  Filled 2020-09-01: qty 50

## 2020-09-01 MED ORDER — HEPARIN SODIUM (PORCINE) 1000 UNIT/ML DIALYSIS
1000.0000 [IU] | INTRAMUSCULAR | Status: DC | PRN
Start: 2020-09-01 — End: 2020-09-02
  Filled 2020-09-01: qty 6

## 2020-09-01 MED ORDER — SODIUM BICARBONATE 8.4 % IV SOLN
100.0000 meq | Freq: Once | INTRAVENOUS | Status: AC
  Administered 2020-09-01: 100 meq via INTRAVENOUS

## 2020-09-01 MED ORDER — AMIODARONE LOAD VIA INFUSION
150.0000 mg | Freq: Once | INTRAVENOUS | Status: AC
  Administered 2020-09-01: 150 mg via INTRAVENOUS

## 2020-09-01 MED ORDER — HYDRALAZINE HCL 20 MG/ML IJ SOLN
10.0000 mg | INTRAMUSCULAR | Status: DC | PRN
  Administered 2020-09-01: 20 mg via INTRAVENOUS
  Filled 2020-09-01: qty 1

## 2020-09-01 MED ORDER — VASOPRESSIN 20 UNITS/100 ML INFUSION FOR SHOCK
0.0000 [IU]/min | INTRAVENOUS | Status: DC
  Administered 2020-09-01: 0.03 [IU]/min via INTRAVENOUS

## 2020-09-01 MED ORDER — ISOSORB DINITRATE-HYDRALAZINE 20-37.5 MG PO TABS: 1.0000 | ORAL_TABLET | Freq: Three times a day (TID) | ORAL | Status: DC

## 2020-09-01 MED ORDER — SODIUM CHLORIDE 0.9 % IV SOLN
2.0000 g | INTRAVENOUS | Status: DC
  Administered 2020-09-01: 2 g via INTRAVENOUS
  Filled 2020-09-01: qty 20

## 2020-09-01 MED ORDER — MIDAZOLAM HCL 2 MG/2ML IJ SOLN
1.0000 mg | INTRAMUSCULAR | Status: DC | PRN
  Administered 2020-09-01: 2 mg via INTRAVENOUS
  Filled 2020-09-01: qty 2

## 2020-09-01 MED ORDER — CALCIUM CHLORIDE 10 % IV SOLN
1.0000 g | Freq: Once | INTRAVENOUS | Status: AC
  Administered 2020-09-01: 1 g via INTRAVENOUS
  Filled 2020-09-01: qty 10

## 2020-09-01 MED ORDER — CALCIUM GLUCONATE-NACL 1-0.675 GM/50ML-% IV SOLN
1.0000 g | Freq: Once | INTRAVENOUS | Status: AC
  Administered 2020-09-01: 1000 mg via INTRAVENOUS
  Filled 2020-09-01: qty 50

## 2020-09-01 MED ORDER — PRISMASOL BGK 4/2.5 32-4-2.5 MEQ/L EC SOLN
Status: DC
  Filled 2020-09-01 (×9): qty 5000

## 2020-09-01 MED ORDER — NOREPINEPHRINE 16 MG/250ML-% IV SOLN
0.0000 ug/min | INTRAVENOUS | Status: DC
  Administered 2020-09-01: 45 ug/min via INTRAVENOUS
  Administered 2020-09-01: 50 ug/min via INTRAVENOUS
  Filled 2020-09-01 (×2): qty 250

## 2020-09-01 MED ORDER — EPINEPHRINE 1 MG/10ML IJ SOSY
PREFILLED_SYRINGE | INTRAMUSCULAR | Status: AC
  Administered 2020-09-01: 1 mg via INTRAVENOUS
  Filled 2020-09-01: qty 10

## 2020-09-01 MED ORDER — PRISMASOL BGK 4/2.5 32-4-2.5 MEQ/L REPLACEMENT SOLN
Status: DC
  Filled 2020-09-01 (×2): qty 5000

## 2020-09-01 MED ORDER — STERILE WATER FOR INJECTION IV SOLN
INTRAVENOUS | Status: DC
  Filled 2020-09-01: qty 850

## 2020-09-01 MED ORDER — AMIODARONE HCL IN DEXTROSE 360-4.14 MG/200ML-% IV SOLN
INTRAVENOUS | Status: AC
  Filled 2020-09-01: qty 200

## 2020-09-01 MED ORDER — SODIUM BICARBONATE 8.4 % IV SOLN
100.0000 meq | Freq: Once | INTRAVENOUS | Status: AC
  Administered 2020-09-01: 100 meq via INTRAVENOUS
  Filled 2020-09-01: qty 100

## 2020-09-01 MED ORDER — DEXTROSE 50 % IV SOLN
INTRAVENOUS | Status: AC
  Filled 2020-09-01: qty 50

## 2020-09-01 MED ORDER — SODIUM CHLORIDE 0.9 % IV SOLN: 1.0000 g | INTRAVENOUS | Status: DC

## 2020-09-03 LAB — CULTURE, RESPIRATORY W GRAM STAIN

## 2020-09-04 ENCOUNTER — Encounter (HOSPITAL_COMMUNITY): Payer: Medicare Other

## 2020-09-05 ENCOUNTER — Encounter (HOSPITAL_COMMUNITY): Payer: Medicare Other

## 2020-09-05 ENCOUNTER — Encounter (HOSPITAL_COMMUNITY): Payer: Self-pay | Admitting: Hematology and Oncology

## 2020-09-05 ENCOUNTER — Ambulatory Visit: Payer: Medicare Other | Admitting: Hematology and Oncology

## 2020-09-05 NOTE — Progress Notes (Signed)
ANTICOAGULATION CONSULT NOTE - Follow Up Consult  Pharmacy Consult for heparin Indication: h/o VTE and PAF  Labs: Recent Labs     0000 08/29/20 2200 08/30/20 0351 08/30/20 0700 08/30/20 0804 08/30/20 1713 08/31/20 0501 Sep 09, 2020 0355  HGB   < >  --  8.2*  --   --   --  8.1* 8.5*  HCT  --   --  25.2*  --   --   --  26.2* 28.6*  PLT  --   --  156  --   --   --  133* 117*  APTT  --  65*  --   --  53* 58*  --   --   LABPROT  --   --  15.7*  --   --   --  15.0 15.1  INR  --   --  1.3*  --   --   --  1.2 1.2  HEPARINUNFRC  --   --   --    < >  --  0.50 0.41 0.21*  CREATININE  --   --  3.89*  --   --   --  3.85* 4.11*   < > = values in this interval not displayed.    Assessment: 55 yo female on heparin infusion who takes Eliquis 5mg  BID PTA for afib. 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 Her last dose of Eliquis was given on 2/20 @0802   Hep lvl 0.21 this am  Hg 8.5, PTLC 117  Goal of Therapy:  Heparin level 0.3-0.7 units/ml   Plan:  Increase heparin drip to 800 units/hr Check heparin level later today Daily hep lvl cbc  Thanks for allowing pharmacy to be a part of this patient's care.  Excell Seltzer, PharmD Clinical Pharmacist 09-09-2020 5:19 AM

## 2020-09-05 NOTE — Progress Notes (Signed)
   Sep 20, 2020 1604  Clinical Encounter Type  Visited With Patient and family together  Visit Type Initial;Patient actively dying  Referral From Nurse  Consult/Referral To Chaplain  Spiritual Encounters  Spiritual Needs Prayer;Emotional;Grief support   Chaplain responded to page from unit. Pt's family was bedside and emotional due to recent developments in Pt's condition. Chaplain ensured space for grieving and provided emotional, grief, and spiritual support. Chaplain prayed the Lord's Prayer with Pt and Pt's family at bedside. Pt's family was appreciative of their time with the Pt. Chaplain remains available.   This note was prepared by Chaplain Resident, Dante Gang, MDiv. Chaplain remains available as needed through the on-call pager: [phone].

## 2020-09-05 NOTE — Death Summary Note (Signed)
DEATH SUMMARY   Patient Details  Name: [name] MRN: [id number] DOB: 09/10/1965  Admission/Discharge Information   Admit Date:  2020/09/21  Date of Death: Date of Death: 2020-10-01  Time of Death: Time of Death: August 31, 1750  Length of Stay: 2022/08/15  Referring Physician: [name], DO   Reason(s) for Hospitalization  Acute hypoxic and hypercarbic respiratory failure COVID-19 pneumonia Acute on chronic systolic/diastolic CHF Septic shock due to [place] pneumonia Acute metabolic/hypoxic encephalopathy Pancytopenia: s/p bone marrow biopsy 2/18.  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 year old female who was initially admitted for pancytopenia on Sep 21, 2022 with a hemoglobin of 6.1, hypocalcemia, in the setting of COVID-19.  For her pancytopenia, she had a bone marrow biopsy on 2/18, but this has been thought to be related to chronic disease. She has been diuresed, with fluctuating creatinine (nephro on board) but good urine output.  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   She is also getting decadron 77m for  COVID-19 pneumonia.  For her hypocalcemia, due to her CKD, she is getting vitamin D repletion, calcitriol, and as needed IV calcium replacement   She was initially on 4L of O2 for her stay until this morning, when she had increasing O2 requirements and agitated delirium.  Also noted to be hypertensive during this episode and had an episode of hemoptysis along with increasing O2 requirements.    She was very agitated on arrival to ICU on 2/20, was on nonrebreather mask, and could not get an accurate pulse ox.  ABG showed respiratory acidosis (pre-intubation).  Patient was urgently intubated due to respiratory failure.  On 203/27/2022patient became hypotensive, requiring increasing vasopressors, she was on Levophed and vasopressin, her ABG suggests severe metabolic acidosis.  Due to high pressor requirement patient was unable to get CRRT. Patient's family was contacted after meeting and explaining prognosis, patient's family decided to keep her DNR. Patient became asystolic and was declared dead on 22022/09/2715:58  Pertinent Labs and Studies  Significant Diagnostic Studies DG Abd 1 View  Result Date: 08/31/2020 CLINICAL DATA:  Orogastric tube placement. EXAM: ABDOMEN - 1 VIEW COMPARISON:  08/27/2020 FINDINGS: An enteric tube now projects over the proximal portion of the stomach with tip in the region of the gastric fundus (previously in the distal stomach). There is mild diffuse gaseous distension of small bowel loops in the included portion of the abdomen, and mild colonic gaseous distension is also evident. IMPRESSION: 1. Enteric tube in the proximal stomach. 2. Mild diffuse gaseous bowel distension which could reflect mild ileus. Electronically Signed   By: ALogan BoresM.D.   On: 08/31/2020 16:39   DG Abd 1 View  Result Date: 08/27/2020 CLINICAL DATA:  Tube placement EXAM: ABDOMEN -  1 VIEW COMPARISON:  AUB 02/21/2007 FINDINGS: OG tube tip projects over the gastric antrum/pylorus. The tip is pointed  distally. The visualized bowel gas pattern is nonspecific. IMPRESSION: OG tube tip projects over the gastric antrum/pylorus. Electronically Signed   By: Constance Holster M.D.   On: 08/27/2020 20:07   DG CHEST PORT 1 VIEW  Result Date: 08/31/2020 CLINICAL DATA:  Pulmonary edema. EXAM: PORTABLE CHEST 1 VIEW COMPARISON:  08/28/2020. FINDINGS: Endotracheal tube, NG tube, left IJ line stable position. Heart size normal. Low lung volumes with bibasilar atelectasis. Diffuse bilateral pulmonary infiltrates/edema again noted without interim change. Moderate right pleural effusion. Small left pleural effusion. Similar findings noted on prior exam. No pneumothorax. IMPRESSION: 1. Lines and tubes in stable position. 2. Low lung volumes with bibasilar atelectasis. Diffuse bilateral pulmonary infiltrates/edema again noted without interim change. Moderate right pleural effusion. Small left pleural effusion. Electronically Signed   By: Marcello Moores  Register   On: 08/31/2020 05:12   DG CHEST PORT 1 VIEW  Result Date: 08/28/2020 CLINICAL DATA:  Central venous catheter in place. EXAM: PORTABLE CHEST 1 VIEW COMPARISON:  08/27/2020 FINDINGS: Endotracheal tube terminates 2.7 cm above the carina. Left jugular catheter is unchanged with tip in the expected region of the confluence of the brachiocephalic veins. Enteric tube courses into the abdomen with tip not imaged. The cardiac silhouette is largely obscured. Widespread bilateral airspace opacities are unchanged on the left and may have mildly progressed in the right lung base. There is a small right pleural effusion. No pneumothorax is identified. IMPRESSION: 1. Support devices as above. 2. Extensive bilateral airspace disease with possible mild progression in the right lung base. Electronically Signed   By: Logan Bores M.D.   On: 08/28/2020 16:27   DG CHEST PORT 1 VIEW  Result Date: 08/27/2020 CLINICAL DATA:  Central line placement EXAM: PORTABLE CHEST 1 VIEW COMPARISON:   August 27, 2020 FINDINGS: A left central line is been placed in the interval. The distal tip crosses midline to the right with the tip directed slightly superiorly. I suspect the distal tip is near the confluence of the right subclavian vein and brachiocephalic vein. No pneumothorax. Diffuse bilateral pulmonary opacities are similar in the interval. Stable cardiomediastinal silhouette. No pneumothorax. The ETT is in good position. The NG tube terminates below today's film. IMPRESSION: 1. The left central line crosses midline with the tip directed slightly in a cranial direction suggesting the tip may be near the confluency of the right subclavian vein and brachiocephalic vein. Recommend repositioning before use. 2. Other support apparatus as above. 3. Continued bilateral pulmonary infiltrates. These results will be called to the ordering clinician or representative by the Radiologist Assistant, and communication documented in the PACS or [employer]. Electronically Signed   By: Dorise Bullion III M.D   On: 08/27/2020 20:49   DG CHEST PORT 1 VIEW  Result Date: 08/27/2020 CLINICAL DATA:  Tube placement EXAM: PORTABLE CHEST 1 VIEW COMPARISON:  02/30/2022 FINDINGS: The endotracheal tube terminates above the carina. The OG tube terminates below the left hemidiaphragm. Diffuse bilateral hazy airspace opacities are noted. There is no pneumothorax. There may be a small right-sided pleural effusion. The heart size is stable. There is no acute osseous abnormality. IMPRESSION: 1. Endotracheal tube terminates above the carina. 2. OG tube terminates below the left hemidiaphragm. 3. Diffuse bilateral hazy airspace opacities. Electronically Signed   By: Constance Holster M.D.   On: 08/27/2020 20:07   DG CHEST PORT 1 VIEW  Result Date:  08/27/2020 CLINICAL DATA:  COVID positive with acute respiratory failure. EXAM: PORTABLE CHEST 1 VIEW COMPARISON:  August 22, 2020 FINDINGS: Moderate to marked severity bilateral  multifocal infiltrates are seen. There is no evidence of a pleural effusion or pneumothorax. The cardiac silhouette is markedly enlarged. The visualized skeletal structures are unremarkable. IMPRESSION: Moderate to marked severity bilateral multifocal infiltrates. Electronically Signed   By: Virgina Norfolk M.D.   On: 08/27/2020 18:58   DG Chest Port 1 View  Result Date: 08/29/2020 CLINICAL DATA:  Cough. EXAM: PORTABLE CHEST 1 VIEW COMPARISON:  07/08/2020. FINDINGS: Interim removal of left IJ line. Cardiomegaly again noted. Persistent dense bibasilar atelectasis scratched it persistent bibasilar atelectasis/infiltrates. Bilateral interstitial prominence again noted consistent interstitial edema and or pneumonitis. Slight improvement from prior exam. Small bilateral pleural effusions again noted. No pneumothorax. IMPRESSION: 1. Persistent bibasilar atelectasis/infiltrates and small bilateral pleural effusions. 2. Cardiomegaly again noted. Bilateral interstitial prominence again noted consistent with interstitial edema and or pneumonitis. Slight improvement from prior exam. 3. Interim removal of left IJ line.  No pneumothorax. Electronically Signed   By: Marcello Moores  Register   On: 08/26/2020 07:49   CT BONE MARROW BIOPSY & ASPIRATION  Result Date: 08/25/2020 INDICATION: Symptomatic anemia EXAM: CT GUIDED BONE MARROW ASPIRATES AND BIOPSY MEDICATIONS: None. ANESTHESIA/SEDATION: Fentanyl 100 mcg IV; Versed .  Two mg IV Moderate Sedation Time:  28 minutes The patient was continuously monitored during the procedure by the interventional radiology nurse under my direct supervision. COMPLICATIONS: None immediate. PROCEDURE: The procedure was explained to the patient. The risks and benefits of the procedure were discussed and the patient's questions were addressed. Informed consent was obtained from the patient. The patient was placed prone on CT table. Images of the pelvis were obtained. The right side of back was  prepped and draped in sterile fashion. The skin and right posterior ilium were anesthetized with 1% lidocaine. 11 gauge bone needle was directed into the right ilium with CT guidance. No blood could be aspirated. Two core biopsy were obtained. Bandage placed over the puncture site. IMPRESSION: CT guided bone marrow aspiration and core biopsy. No blood could be aspirated, therefore two 11 gauge cores were obtained. Electronically Signed   By: Miachel Roux M.D.   On: 08/25/2020 15:50   VAS [place] LOWER EXTREMITY VENOUS (DVT)  Result Date: 08/28/2020  Lower Venous DVT Study Indications: Swelling, Covid-19.  Limitations: Poor ultrasound/tissue interface. Comparison Study: 03-16-2020 Prior bilateral lower extremity venous study was                   negative for DVT. Performing Technologist: Darlin Coco RDMS  Examination Guidelines: A complete evaluation includes B-mode imaging, spectral Doppler, color Doppler, and power Doppler as needed of all accessible portions of each vessel. Bilateral testing is considered an integral part of a complete examination. Limited examinations for reoccurring indications may be performed as noted. The reflux portion of the exam is performed with the patient in reverse Trendelenburg.  +---------+---------------+---------+-----------+----------+--------------+ RIGHT    CompressibilityPhasicitySpontaneityPropertiesThrombus Aging +---------+---------------+---------+-----------+----------+--------------+ CFV      Full           Yes      Yes                                 +---------+---------------+---------+-----------+----------+--------------+ SFJ      Full                                                        +---------+---------------+---------+-----------+----------+--------------+  FV Prox  Full                                                        +---------+---------------+---------+-----------+----------+--------------+ FV Mid   Full                                                         +---------+---------------+---------+-----------+----------+--------------+ FV DistalFull                                                        +---------+---------------+---------+-----------+----------+--------------+ PFV      Full                                                        +---------+---------------+---------+-----------+----------+--------------+ POP      Full           Yes      Yes                                 +---------+---------------+---------+-----------+----------+--------------+ PTV      Full                                                        +---------+---------------+---------+-----------+----------+--------------+ PERO     Full                                                        +---------+---------------+---------+-----------+----------+--------------+   +---------+---------------+---------+-----------+----------+--------------+ LEFT     CompressibilityPhasicitySpontaneityPropertiesThrombus Aging +---------+---------------+---------+-----------+----------+--------------+ CFV      Full           Yes      Yes                                 +---------+---------------+---------+-----------+----------+--------------+ SFJ      Full                                                        +---------+---------------+---------+-----------+----------+--------------+ FV Prox  Full                                                        +---------+---------------+---------+-----------+----------+--------------+  FV Mid   Full                                                        +---------+---------------+---------+-----------+----------+--------------+ FV DistalFull                                                        +---------+---------------+---------+-----------+----------+--------------+ PFV      Full                                                         +---------+---------------+---------+-----------+----------+--------------+ POP      Full           Yes      Yes                                 +---------+---------------+---------+-----------+----------+--------------+ PTV      Full                                                        +---------+---------------+---------+-----------+----------+--------------+ PERO     Full                                                        +---------+---------------+---------+-----------+----------+--------------+     Summary: RIGHT: - There is no evidence of deep vein thrombosis in the lower extremity.  - No cystic structure found in the popliteal fossa.  LEFT: - There is no evidence of deep vein thrombosis in the lower extremity.  - No cystic structure found in the popliteal fossa.  *See table(s) above for measurements and observations. Electronically signed by Ruta Hinds MD on 08/28/2020 at 5:49:21 PM.    Final    ECHOCARDIOGRAM LIMITED  Result Date: 08/28/2020    ECHOCARDIOGRAM LIMITED REPORT   Patient Name:   [name] Date of Exam: 08/28/2020 Medical Rec #:  [id number]       Height:       71.0 in Accession #:    [id number]      Weight:       256.6 lb Date of Birth:  11-30-1965       BSA:          2.344 m Patient Age:    33 years        BP:           209/100 mmHg Patient Gender: F               HR:           53 bpm. Exam Location:  Inpatient Procedure: Limited Echo, Limited Color Doppler  and Cardiac Doppler STAT ECHO Indications:    acute respiratory distress  History:        Patient has prior history of Echocardiogram examinations, most                 recent 07/05/2020. Covid; Arrythmias:Paroxymal A-fib.  Sonographer:    Johny Chess Referring Phys: [id number] ALEXANDER N RAINES IMPRESSIONS  1. Left ventricular ejection fraction, by estimation, is 35 to 40%. The left ventricle has moderately decreased function. The left ventricle demonstrates global hypokinesis. The left ventricular  internal cavity size was severely dilated. There is severe  concentric left ventricular hypertrophy. Left ventricular diastolic parameters are consistent with Grade II diastolic dysfunction (pseudonormalization).  2. Right ventricular systolic function is normal. There is mildly elevated pulmonary artery systolic pressure. The estimated right ventricular systolic pressure is 35.3 mmHg.  3. A small pericardial effusion is present. The pericardial effusion is lateral to the left ventricle.  4. The mitral valve is grossly normal. Trivial mitral valve regurgitation.  5. There is moderate calcification of the aortic valve. Aortic valve regurgitation is moderate.  6. The inferior vena cava is normal in size with greater than 50% respiratory variability, suggesting right atrial pressure of 3 mmHg. Comparison(s): No prior Echocardiogram. Conclusion(s)/Recommendation(s): There is new reduced ejection fraction with calcified aortic valve, ventricular dilation, and aortic regurgitation. Would consider evaluation for bicuspid aortic valve (CCTA vs TEE as a future study). FINDINGS  Left Ventricle: LVEF 46% by Quinones equation, 45% by Dolores Patty. Left ventricular ejection fraction, by estimation, is 35 to 40%. The left ventricle has moderately decreased function. The left ventricle demonstrates global hypokinesis. The left ventricular internal cavity size was severely dilated. There is severe concentric left ventricular hypertrophy. Left ventricular diastolic parameters are consistent with Grade II diastolic dysfunction (pseudonormalization). Right Ventricle: Right ventricular systolic function is normal. There is mildly elevated pulmonary artery systolic pressure. The tricuspid regurgitant velocity is 2.65 m/s, and with an assumed right atrial pressure of 8 mmHg, the estimated right ventricular systolic pressure is 61.4 mmHg. Pericardium: A small pericardial effusion is present. The pericardial effusion is lateral to the  left ventricle. Mitral Valve: The mitral valve is grossly normal. Mild mitral annular calcification. Trivial mitral valve regurgitation. Tricuspid Valve: The tricuspid valve is grossly normal. Tricuspid valve regurgitation is mild. Aortic Valve: There is moderate calcification of the aortic valve. Aortic valve regurgitation is moderate. Aortic regurgitation PHT measures 531 msec. Pulmonic Valve: The pulmonic valve was grossly normal. Pulmonic valve regurgitation is mild. Aorta: The aortic root and ascending aorta are structurally normal, with no evidence of dilitation. Venous: The inferior vena cava is normal in size with greater than 50% respiratory variability, suggesting right atrial pressure of 3 mmHg. LEFT VENTRICLE PLAX 2D LVIDd:         6.40 cm  Diastology LVIDs:         5.10 cm  LV e' medial:    4.35 cm/s LV PW:         1.70 cm  LV E/e' medial:  18.3 LV IVS:        1.70 cm  LV e' lateral:   5.22 cm/s LVOT diam:     2.30 cm  LV E/e' lateral: 15.2 LV SV:         108 LV SV Index:   46 LVOT Area:     4.15 cm  RIGHT VENTRICLE             IVC RV S prime:  10.40 cm/s  IVC diam: 1.90 cm LEFT ATRIUM         Index LA diam:    5.70 cm 2.43 cm/m  AORTIC VALVE LVOT Vmax:   108.00 cm/s LVOT Vmean:  66.700 cm/s LVOT VTI:    0.259 m AI PHT:      531 msec  AORTA Ao Root diam: 3.10 cm Ao Asc diam:  3.60 cm MITRAL VALVE               TRICUSPID VALVE MV Area (PHT): 2.14 cm    TR Peak grad:   28.1 mmHg MV Decel Time: 354 msec    TR Vmax:        265.00 cm/s MV E velocity: 79.60 cm/s MV A velocity: 52.00 cm/s  SHUNTS MV E/A ratio:  1.53        Systemic VTI:  0.26 m                            Systemic Diam: 2.30 cm Rudean Haskell MD Electronically signed by Rudean Haskell MD Signature Date/Time: 08/28/2020/10:30:29 AM    Final     Microbiology Recent Results (from the past 240 hour(s))  MRSA PCR Screening     Status: None   Collection Time: 09/03/2020 11:16 PM   Specimen: Nasal Mucosa; Nasopharyngeal  Result  Value Ref Range Status   MRSA by PCR NEGATIVE NEGATIVE Final    Comment:        The GeneXpert MRSA Assay (FDA approved for NASAL specimens only), is one component of a comprehensive MRSA colonization surveillance program. It is not intended to diagnose MRSA infection nor to guide or monitor treatment for MRSA infections. Performed at [facility], [place] [address] [place], [place] [address]   Culture, blood (routine x 2)     Status: None   Collection Time: 08/27/20 10:54 PM   Specimen: BLOOD RIGHT HAND  Result Value Ref Range Status   Specimen Description BLOOD RIGHT HAND  Final   Special Requests   Final    BOTTLES DRAWN AEROBIC ONLY Blood Culture results may not be optimal due to an inadequate volume of blood received in culture bottles   Culture   Final    NO GROWTH 5 DAYS Performed at [facility], [place] [address] [place], [place] [address]    Report Status 09-16-2020 FINAL  Final  Culture, blood (routine x 2)     Status: None   Collection Time: 08/27/20 10:58 PM   Specimen: BLOOD RIGHT ARM  Result Value Ref Range Status   Specimen Description BLOOD RIGHT ARM  Final   Special Requests   Final    BOTTLES DRAWN AEROBIC ONLY Blood Culture results may not be optimal due to an inadequate volume of blood received in culture bottles   Culture   Final    NO GROWTH 5 DAYS Performed at [facility], [place] [address] [place], [place] [address]    Report Status 2020/09/16 FINAL  Final  Culture, Respiratory w Gram Stain     Status: None   Collection Time: 08/28/20  1:57 AM   Specimen: Tracheal Aspirate; Respiratory  Result Value Ref Range Status   Specimen Description TRACHEAL ASPIRATE  Final   Special Requests NONE  Final   Gram Stain   Final    FEW WBC PRESENT,BOTH PMN AND MONONUCLEAR FEW GRAM NEGATIVE RODS RARE GRAM POSITIVE COCCI IN CHAINS  Culture   Final    RARE KLEBSIELLA PNEUMONIAE FEW HAEMOPHILUS PARAHAEMOLYTICUS BETA LACTAMASE NEGATIVE Performed  at [facility], [place] [address] [place], [place] [address]    Report Status 08/30/2020 FINAL  Final   Organism ID, Bacteria KLEBSIELLA PNEUMONIAE  Final      Susceptibility   Klebsiella pneumoniae - MIC*    AMPICILLIN >=32 RESISTANT Resistant     CEFAZOLIN <=4 SENSITIVE Sensitive     CEFEPIME <=0.12 SENSITIVE Sensitive     CEFTAZIDIME <=1 SENSITIVE Sensitive     CEFTRIAXONE <=0.25 SENSITIVE Sensitive     CIPROFLOXACIN <=0.25 SENSITIVE Sensitive     GENTAMICIN <=1 SENSITIVE Sensitive     IMIPENEM <=0.25 SENSITIVE Sensitive     TRIMETH/SULFA <=20 SENSITIVE Sensitive     AMPICILLIN/SULBACTAM 4 SENSITIVE Sensitive     PIP/TAZO <=4 SENSITIVE Sensitive     * RARE KLEBSIELLA PNEUMONIAE  Culture, Respiratory w Gram Stain     Status: None (Preliminary result)   Collection Time: 09-12-20  3:01 AM   Specimen: Tracheal Aspirate; Respiratory  Result Value Ref Range Status   Specimen Description TRACHEAL ASPIRATE  Final   Special Requests NONE  Final   Gram Stain   Final    MODERATE WBC PRESENT, PREDOMINANTLY PMN FEW SQUAMOUS EPITHELIAL CELLS PRESENT ABUNDANT GRAM POSITIVE COCCI ABUNDANT GRAM NEGATIVE RODS MODERATE GRAM POSITIVE RODS Performed at [facility], [place] [address] [place], [place] [address]    Culture PENDING  Incomplete   Report Status PENDING  Incomplete    Lab Basic Metabolic Panel: Recent Labs  Lab 08/27/20 0608 08/27/20 2018 08/27/20 2212 08/27/20 2357 08/28/20 2041 08/29/20 0415 08/29/20 1751 08/30/20 0351 08/30/20 1713 08/31/20 0501 September 12, 2020 0355  NA 142   < > 138   < > 141 139  --  140  --  137 138  K 3.9   < > 3.0*   < > 3.7 3.4*  --  4.0  --  4.7 4.5  CL 103  --  102   < > 104 103  --  103  --  101 102  CO2 21*  --  18*   < > 20* 20*  --  21*  --  20* 21*  GLUCOSE 179*  --  178*   < > 139* 158*  --  199*  --  218* 80  BUN 90*  --  92*   < > 93* 94*  --  94*  --  97* 96*  CREATININE 3.95*  --  3.88*   < > 3.82* 3.89*  --  3.89*  --  3.85*  4.11*  CALCIUM 6.2*  --  5.9*   < > 6.3* 6.3*  --  6.2*  --  6.5* 6.8*  MG  --   --  1.6*  --   --   --  2.0 1.9 2.1 2.2  --   PHOS 5.2*  --   --   --   --   --  5.0* 5.3* 5.1* 5.7*  --    < > = values in this interval not displayed.   Liver Function Tests: Recent Labs  Lab 08/27/20 0608  ALBUMIN 3.1*   No results for input(s): LIPASE, AMYLASE in the last 168 hours. No results for input(s): AMMONIA in the last 168 hours. CBC: Recent Labs  Lab 08/27/20 2212 08/27/20 2357 08/28/20 0445 08/28/20 1121 08/28/20 2041 08/29/20 0415 08/30/20 0351 08/31/20 0501 2020/09/12 0355  WBC 5.3  --  3.7*  --   --  2.2* 7.4 7.6 2.8*  NEUTROABS 4.5  --   --   --   --   --   --   --   --   HGB 7.0*   < > 6.1*   < > 8.0* 7.7* 8.2* 8.1* 8.5*  HCT 23.0*   < > 20.5*   < > 25.0* 23.0* 25.2* 26.2* 28.6*  MCV 96.2  --  97.2  --   --  88.5 91.0 94.2 96.3  PLT 138*  --  108*  --   --  125* 156 133* 117*   < > = values in this interval not displayed.   Cardiac Enzymes: No results for input(s): CKTOTAL, CKMB, CKMBINDEX, TROPONINI in the last 168 hours. Sepsis Labs: Recent Labs  Lab 08/27/20 2212 08/28/20 0445 08/28/20 0644 08/29/20 0415 08/30/20 0351 08/31/20 0501 09/24/20 0355  WBC 5.3   < >  --  2.2* 7.4 7.6 2.8*  LATICACIDVEN 1.5  --  1.3  --   --   --   --    < > = values in this interval not displayed.    Procedures/Operations     [facility] 09/24/2020, 6:06 PM

## 2020-09-05 NOTE — Progress Notes (Signed)
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  Had bone marrow biopsy 2/18.  Had rapid deterioration on the floor today, 2/20, where she had increasing oxygen requirements, and severe agitation (was hypertensive) requiring transfer to ICU.   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 CHF (EF 20-25%) RUE DVT DM HTN  Significant Hospital Events:  Bone marrow biopsy 2/18 Intubation for acute hypoxic and hypercarbic respiratory failure 2/20  Consults:  Nephrology  Procedures:  BM biopsy 2/18 Intubation 2/20 L IJ CVC placed 2/20- in subclavian vein  Significant Diagnostic Tests:  CXR 2/20: increased bilateral alveolar infiltrates 2/18 bone marrow bx: (sent for [facility] analysis for MDS) insufficient sample for flow cytometric analysis  Micro Data:  2/15 SARS Cov2 POSITIVE 2/15 MRSA screen NEGATIVE 2/20 blood cultures >> ngtd 2/20 respiratory cultures>>  Antimicrobials:     Interim History / Subjective:  2/25: Patient became hemodynamically unstable, started requiring higher dose vasopressors, currently on Levophed of 50 with vasopressin.  Stress dose steroid added Could not tolerate HD, started on CRRT 2/24: sedated rass -3, going to start dialysis today, diminished uop and inability to appropriately awaken consistently and wean from ventilator.  2/23: improved mental status, labored breathing on sbt 2/22: renal function stable today . Remains off cellcept. Mental status remains poor 2/21: remains sedated, renal insufficiency cont to worsen by indices but uop good.  . hgb remains low and platelets cont to fall. Echo without rhs and stable from 12/21, le dopplers pending with RLE >>LLE  In size. On minimal vent settings, pt moves but does not follow commands. Transfusing one unit blood for hgb 6.1. no overt bleeding noted.   Objective   Blood pressure (!) 94/57, pulse 94, temperature 98.96 F (37.2 C), resp. rate (!) 23, height $RemoveBe'5\' 11"'DdDVXyGjT$  (1.803 m), weight 120 kg, last menstrual period 11/05/2012, SpO2 90 %.    Vent Mode: PRVC FiO2 (%):  [30 %-100 %] 100 % Set Rate:  [28 bmp-35 bmp] 35 bmp Vt Set:  [420 mL] 420 mL PEEP:  [5 cmH20-10 cmH20] 10 cmH20 Plateau Pressure:  [23 cmH20-32 cmH20] 32 cmH20   Intake/Output Summary (Last 24 hours) at 09-22-2020 1346 Last data filed at 09-22-20 1300 Gross per 24 hour  Intake 2725.15 ml  Output 965 ml  Net 1760.15 ml   Filed Weights   08/29/20 0410 08/30/20 0500 08/31/20 0500  Weight: 117.4 kg 119.5 kg 120 kg    Examination: General: Middle-aged obese African-American female, lying in the bed, orally intubated HENT: Atraumatic, normocephalic, dry mucous membranes, no JVD Lungs: Bilateral rhonchi, no wheezes or crackles Cardiovascular: regular rate and rhythm, no murmur Abdomen: obese soft, nontender, nondistended Extremities: pitting edema b/l R >>L le Neuro:sedated unresponsive on vent   Resolved Hospital Problem list     Assessment & Plan:  55 yo F with  ATIII deficiency, Afib, 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  Had acute decompensation on 2/20 with increasing O2 requirements, agitation, and  acute hypoxic and hypercarbic respiratory failure requiring intubation.    Acute hypoxic and hypercarbic respiratory failure COVID-19 pneumonia Acute on chronic systolic/diastolic CHF  Continue lung protective ventilation Currently patient is on 100% FiO2 and PEEP of 10 Continue  contact and airborne isolation Completed therapy with dexamethasone Patient does not make much urine, started on HD, could not tolerate due to hypotension currently on CRRT, will try to keep net negative fluid balance  Septic shock due to [place] pneumonia Patient became hypotensive, progressively requiring higher doses of vasopressors currently on 50 mics of Levophed and 0.03 of vasopressin with map goal 65 Started on stress dose steroid Started on IV ceftriaxone   Acute metabolic/hypoxic encephalopathy: Minimize sedation with RASS goal -1--2  Pancytopenia: s/p bone marrow biopsy 2/18.  Likely from chronic illness, and advanced renal disease Follow up BM bx results (sent for [facility] analysis for MDS) insufficient sample for flow cytometric analysis Monitor CBC At this time patient does not require transfusion  CKD stage V Hypocalcemia Nephrology consulted, patient has progressed to end-stage renal disease again She is on immunosuppression for renal transplant Started on CRRT Continue vitamin D and calcium supplements  Paroxysmal A. fib Patient is in sinus rhythm currently continue amiodarone   AT III deficiency RUE DVT Continue heparin infusion  Prognosis: Guarded Best practice (evaluated daily)  Diet: NPO Pain/Anxiety/Delirium protocol (if indicated): yes VAP protocol (if indicated): yes DVT prophylaxis: heparin gtt GI prophylaxis: ppi  Glucose control: SSI q4H Mobility: Bedrest Disposition:ICU  Goals of Care:  Last date of multidisciplinary goals of care discussion: daughter via phone 2/25 Family and staff present: via phone patient's daughter, son and daughter-in-law Summary of discussion: Continue aggressive medical care Follow up goals of care discussion due: 3/2 Code Status: Patient was made DNR per family request  Labs   CBC: Recent Labs  Lab 08/27/20 2212 08/27/20 2357 08/28/20 0445 08/28/20 1121 08/28/20 2041 08/29/20 0415  08/30/20 0351 08/31/20 0501 Sep 18, 2020 0355  WBC 5.3  --  3.7*  --   --  2.2* 7.4 7.6 2.8*  NEUTROABS 4.5  --   --   --   --   --   --   --   --   HGB 7.0*   < > 6.1*   < > 8.0* 7.7* 8.2* 8.1* 8.5*  HCT 23.0*   < > 20.5*   < > 25.0* 23.0* 25.2* 26.2* 28.6*  MCV 96.2  --  97.2  --   --  88.5 91.0 94.2 96.3  PLT 138*  --  108*  --   --  125* 156 133* 117*   < > = values in this interval not displayed.    Basic Metabolic Panel: Recent Labs  Lab 08/27/20 0608 08/27/20 2018 08/27/20 2212 08/27/20 2357 08/28/20 2041 08/29/20 0415 08/29/20 1751 08/30/20 0351 08/30/20 1713 08/31/20 0501 09-18-20 0355  NA 142   < > 138   < > 141 139  --  140  --  137 138  K 3.9   < > 3.0*   < > 3.7 3.4*  --  4.0  --  4.7 4.5  CL 103  --  102   < > 104 103  --  103  --  101 102  CO2 21*  --  18*   < > 20* 20*  --  21*  --  20* 21*  GLUCOSE 179*  --  178*   < > 139* 158*  --  199*  --  218* 80  BUN 90*  --  92*   < > 93* 94*  --  94*  --  97* 96*  CREATININE 3.95*  --  3.88*   < > 3.82* 3.89*  --  3.89*  --  3.85* 4.11*  CALCIUM 6.2*  --  5.9*   < > 6.3* 6.3*  --  6.2*  --  6.5* 6.8*  MG  --   --  1.6*  --   --   --  2.0 1.9 2.1 2.2  --   PHOS 5.2*  --   --   --   --   --  5.0* 5.3* 5.1* 5.7*  --    < > = values in this interval not displayed.   GFR: Estimated Creatinine Clearance: 21.8 mL/min (A) (by C-G formula based on SCr of 4.11 mg/dL (H)). Recent Labs  Lab 08/27/20 2212 08/28/20 0445 08/28/20 0644 08/29/20 0415 08/30/20 0351 08/31/20 0501 2020-09-19 0355  WBC 5.3   < >  --  2.2* 7.4 7.6 2.8*  LATICACIDVEN 1.5  --  1.3  --   --   --   --    < > = values in this interval not displayed.    Liver Function Tests: Recent Labs  Lab 08/27/20 0608  ALBUMIN 3.1*   No results for input(s): LIPASE, AMYLASE in the last 168 hours. No results for input(s): AMMONIA in the last 168 hours.  ABG    Component Value Date/Time   PHART 7.408 08/27/2020 2357   PCO2ART 38.0 08/27/2020 2357   PO2ART  105 08/27/2020 2357   HCO3 23.9 08/27/2020 2357   TCO2 25 08/27/2020 2357   ACIDBASEDEF 1.0 08/27/2020 2357   O2SAT 98.0 08/27/2020 2357     Coagulation Profile: Recent Labs  Lab 08/28/20 0445 08/29/20 0415 08/30/20 0351 08/31/20 0501 09-19-20 0355  INR 1.6* 1.4* 1.3* 1.2 1.2    Cardiac Enzymes: No results for input(s): CKTOTAL, CKMB, CKMBINDEX, TROPONINI in the last 168 hours.  HbA1C: Hemoglobin A1C  Date/Time Value Ref Range Status  12/02/2017 12:00 AM 4.8  Final  08/04/2017 12:00 AM 5.3  Final   Hgb A1c MFr Bld  Date/Time Value Ref Range Status  07/05/2020 05:32 AM 5.6 4.8 - 5.6 % Final    Comment:    (NOTE) Pre diabetes:          5.7%-6.4%  Diabetes:              >6.4%  Glycemic control for   <7.0% adults with diabetes   03/14/2020 10:15 PM 5.0 4.8 - 5.6 % Final    Comment:    (NOTE) Pre diabetes:          5.7%-6.4%  Diabetes:              >6.4%  Glycemic control for   <7.0% adults with diabetes     CBG: Recent Labs  Lab 08/31/20 1630 08/31/20 1956 08/31/20 2319 09/19/20 0349 19-Sep-2020 0727  GLUCAP 167* 165* 158* 79 77    Total critical care time: 54 minutes  Performed by: Cheri Fowler   Critical care time was exclusive of separately billable procedures and treating other patients.   Critical care was necessary to treat or prevent imminent or life-threatening deterioration.   Critical care was time spent personally by me on the following activities: development of treatment plan with patient and/or surrogate as well as nursing, discussions with consultants, evaluation of patient's response to treatment, examination of patient,  obtaining history from patient or surrogate, ordering and performing treatments and interventions, ordering and review of laboratory studies, ordering and review of radiographic studies, pulse oximetry and re-evaluation of patient's condition.   Jacky Kindle MD [facility] See Amion for pager If  no response to pager, please call [phone] until 7pm After 7pm, Please call E-link [phone]

## 2020-09-05 NOTE — Progress Notes (Signed)
[facility] ROUNDING NOTE   Subjective:    had 0.6 L UOP over 2/24.  She didn't get HD on 2/24 due to pt volumes.  Got right femoral nontunneled catheter on 2/24.  Spoke with her nurse - she was pretty hypotensive overnight has been on sedation and has been on levo at 35 mcg/min this am.   Review of systems:     Unable to obtain 2/2 intubated and sedated  ------------------ Brief History:  55 year old lady with a history of renal transplant cadaveric 2011 appears like a baseline creatinine about 2.5 mg/dL based on notes from office.  However during her hospitalizations in January it looks like her creatinine has gradually been worsening.  On 08/06/2020 it did increase to 3.66 mg/dL. she has a history of hypertension and congestive heart failure with a diminished ejection fraction of about 30%.  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  She was diagnosed with COVID-19 pneumonia 08/20/2020.  She has been admitted for Decadron and close monitoring. she has been evaluated by hematology and plans will be for a bone marrow biopsy as an inpatient.  Most recent hospitalizations #1 07/04/2020  -03/09/4096 complications of acute on chronic heart failure with a creatinine that had increased during hospitalization. Most recent hospitalizations #2   08/01/2020 -08/06/2020 with worsening anemia.   Objective:  Vital signs in last 24 hours:  Temp:  [95.72 F (35.4 C)-99.32 F (37.4 C)] 98.6 F (37 C) (02/25 0711) Pulse Rate:  [60-80] 65 (02/25 0711) Resp:  [7-28] 28 (02/25 0711) BP: (94-199)/(57-65) 94/57 (02/25 0711) SpO2:  [88 %-100 %] 88 % (02/25 0711) Arterial Line BP: (109-193)/(29-80) 114/29 (02/25 0600) FiO2 (%):  [30 %-60 %] 60 % (02/25 0711)  Weight change:  Filed Weights   08/29/20 0410 08/30/20 0500 08/31/20 0500  Weight: 117.4 kg 119.5 kg 120 kg    Intake/Output: I/O last 3 completed  shifts: In: 3514.7 [I.V.:2254.7; NG/GT:1260] Out: 353 [Urine:785; Emesis/NG output:90]   Intake/Output this shift:  No intake/output data recorded.  General adult female in bed critically ill  HEENT normocephalic atraumatic  Neck normal circumference trachea midline Lungs clear but reduced on resp auscultation Heart S1S2 no rub Abdomen soft nontender nondistended Extremities 2+ edema  Neuro sedation continuously running Psych calm on sedation Access - Right fem nontunneled catheter; evidence of prior arm accesses but no bruit or thrill appreciated  Basic Metabolic Panel: Recent Labs  Lab 08/27/20 0608 08/27/20 2018 08/27/20 2212 08/27/20 2357 08/28/20 2041 08/29/20 0415 08/29/20 1751 08/30/20 0351 08/30/20 1713 08/31/20 0501 Sep 13, 2020 0355  NA 142   < > 138   < > 141 139  --  140  --  137 138  K 3.9   < > 3.0*   < > 3.7 3.4*  --  4.0  --  4.7 4.5  CL 103  --  102   < > 104 103  --  103  --  101 102  CO2 21*  --  18*   < > 20* 20*  --  21*  --  20* 21*  GLUCOSE 179*  --  178*   < > 139* 158*  --  199*  --  218* 80  BUN 90*  --  92*   < > 93* 94*  --  94*  --  97* 96*  CREATININE 3.95*  --  3.88*   < > 3.82* 3.89*  --  3.89*  --  3.85* 4.11*  CALCIUM 6.2*  --  5.9*   < > 6.3* 6.3*  --  6.2*  --  6.5* 6.8*  MG  --   --  1.6*  --   --   --  2.0 1.9 2.1 2.2  --   PHOS 5.2*  --   --   --   --   --  5.0* 5.3* 5.1* 5.7*  --    < > = values in this interval not displayed.    Liver Function Tests: Recent Labs  Lab 08/27/20 0608  ALBUMIN 3.1*   CBC: Recent Labs  Lab 08/25/20 1158 08/26/20 0245 08/27/20 2212 08/27/20 2357 08/28/20 0445 08/28/20 1121 08/28/20 2041 08/29/20 0415 08/30/20 0351 08/31/20 0501 09/03/20 0355  WBC 2.8*   < > 5.3  --  3.7*  --   --  2.2* 7.4 7.6 2.8*  NEUTROABS 2.2  --  4.5  --   --   --   --   --   --   --   --   HGB 7.8*   < > 7.0*   < > 6.1*   < > 8.0* 7.7* 8.2* 8.1* 8.5*  HCT 24.8*   < > 23.0*   < > 20.5*   < > 25.0* 23.0* 25.2*  26.2* 28.6*  MCV 94.7   < > 96.2  --  97.2  --   --  88.5 91.0 94.2 96.3  PLT 109*   < > 138*  --  108*  --   --  125* 156 133* 117*   < > = values in this interval not displayed.    Microbiology: Results for orders placed or performed during the hospital encounter of 08/25/2020  Resp Panel by RT-PCR (Flu A&B, Covid) Nasopharyngeal Swab     Status: Abnormal   Collection Time: 08/16/2020  9:51 AM   Specimen: Nasopharyngeal Swab; Nasopharyngeal(NP) swabs in vial transport medium  Result Value Ref Range Status   SARS Coronavirus 2 by RT PCR POSITIVE (A) NEGATIVE Final    Comment: RESULT CALLED TO, READ BACK BY AND VERIFIED WITH: RN MISSY E. [phone] (NOTE) SARS-CoV-2 target nucleic acids are DETECTED.  The SARS-CoV-2 RNA is generally detectable in upper respiratory specimens during the acute phase of infection. Positive results are indicative of the presence of the identified virus, but do not rule out bacterial infection or co-infection with other pathogens not detected by the test. Clinical correlation with patient history and other diagnostic information is necessary to determine patient infection status. The expected result is Negative.  Fact Sheet for Patients: EntrepreneurPulse.com.au  Fact Sheet for Healthcare Providers: IncredibleEmployment.be  This test is not yet approved or cleared by the [place] FDA and  has been authorized for detection and/or diagnosis of SARS-CoV-2 by FDA under an Emergency Use Authorization (EUA).  This EUA will remain in effect (meaning this test can be used) for  the duration of  the COVID-19 declaration under Section 564(b)(1) of the Act, 21 U.S.C. section 360bbb-3(b)(1), unless the authorization is terminated or revoked sooner.     Influenza A by PCR NEGATIVE NEGATIVE Final   Influenza B by PCR NEGATIVE NEGATIVE Final    Comment: (NOTE) The Xpert Xpress SARS-CoV-2/FLU/RSV plus assay is intended as an  aid in the diagnosis of influenza from Nasopharyngeal swab specimens and should not be used as a sole basis for treatment. Nasal washings and aspirates are unacceptable for Xpert Xpress SARS-CoV-2/FLU/RSV testing.  Fact Sheet for  Patients: EntrepreneurPulse.com.au  Fact Sheet for Healthcare Providers: IncredibleEmployment.be  This test is not yet approved or cleared by the [place] FDA and has been authorized for detection and/or diagnosis of SARS-CoV-2 by FDA under an Emergency Use Authorization (EUA). This EUA will remain in effect (meaning this test can be used) for the duration of the COVID-19 declaration under Section 564(b)(1) of the Act, 21 U.S.C. section 360bbb-3(b)(1), unless the authorization is terminated or revoked.  Performed at [facility], [place] [address] [place], [place] [address]   MRSA PCR Screening     Status: None   Collection Time: 08/13/2020 11:16 PM   Specimen: Nasal Mucosa; Nasopharyngeal  Result Value Ref Range Status   MRSA by PCR NEGATIVE NEGATIVE Final    Comment:        The GeneXpert MRSA Assay (FDA approved for NASAL specimens only), is one component of a comprehensive MRSA colonization surveillance program. It is not intended to diagnose MRSA infection nor to guide or monitor treatment for MRSA infections. Performed at [facility], [place] [address] [place], [place] [address]   Culture, blood (routine x 2)     Status: None (Preliminary result)   Collection Time: 08/27/20 10:54 PM   Specimen: BLOOD RIGHT HAND  Result Value Ref Range Status   Specimen Description BLOOD RIGHT HAND  Final   Special Requests   Final    BOTTLES DRAWN AEROBIC ONLY Blood Culture results may not be optimal due to an inadequate volume of blood received in culture bottles   Culture   Final    NO GROWTH 4 DAYS Performed at [facility], [place] [address] [place], [place] [address]    Report Status PENDING   Incomplete  Culture, blood (routine x 2)     Status: None (Preliminary result)   Collection Time: 08/27/20 10:58 PM   Specimen: BLOOD RIGHT ARM  Result Value Ref Range Status   Specimen Description BLOOD RIGHT ARM  Final   Special Requests   Final    BOTTLES DRAWN AEROBIC ONLY Blood Culture results may not be optimal due to an inadequate volume of blood received in culture bottles   Culture   Final    NO GROWTH 4 DAYS Performed at [facility], [place] [address], [place], [place] [address]    Report Status PENDING  Incomplete  Culture, Respiratory w Gram Stain     Status: None   Collection Time: 08/28/20  1:57 AM   Specimen: Tracheal Aspirate; Respiratory  Result Value Ref Range Status   Specimen Description TRACHEAL ASPIRATE  Final   Special Requests NONE  Final   Gram Stain   Final    FEW WBC PRESENT,BOTH PMN AND MONONUCLEAR FEW GRAM NEGATIVE RODS RARE GRAM POSITIVE COCCI IN CHAINS    Culture   Final    RARE KLEBSIELLA PNEUMONIAE FEW HAEMOPHILUS PARAHAEMOLYTICUS BETA LACTAMASE NEGATIVE Performed at [facility], [address], [place], [place] [address]    Report Status 08/30/2020 FINAL  Final   Organism ID, Bacteria KLEBSIELLA PNEUMONIAE  Final      Susceptibility   Klebsiella pneumoniae - MIC*    AMPICILLIN >=32 RESISTANT Resistant     CEFAZOLIN <=4 SENSITIVE Sensitive     CEFEPIME <=0.12 SENSITIVE Sensitive     CEFTAZIDIME <=1 SENSITIVE Sensitive     CEFTRIAXONE <=0.25 SENSITIVE Sensitive     CIPROFLOXACIN <=0.25 SENSITIVE Sensitive     GENTAMICIN <=1 SENSITIVE Sensitive  IMIPENEM <=0.25 SENSITIVE Sensitive     TRIMETH/SULFA <=20 SENSITIVE Sensitive     AMPICILLIN/SULBACTAM 4 SENSITIVE Sensitive     PIP/TAZO <=4 SENSITIVE Sensitive     * RARE KLEBSIELLA PNEUMONIAE  Culture, Respiratory w Gram Stain     Status: None (Preliminary result)   Collection Time: 09/19/20  3:01 AM   Specimen: Tracheal Aspirate; Respiratory  Result Value Ref Range Status    Specimen Description TRACHEAL ASPIRATE  Final   Special Requests NONE  Final   Gram Stain   Final    MODERATE WBC PRESENT, PREDOMINANTLY PMN FEW SQUAMOUS EPITHELIAL CELLS PRESENT ABUNDANT GRAM POSITIVE COCCI ABUNDANT GRAM NEGATIVE RODS MODERATE GRAM POSITIVE RODS Performed at [facility], [address], [place], [place] [address]    Culture PENDING  Incomplete   Report Status PENDING  Incomplete    Urinalysis: No results for input(s): COLORURINE, LABSPEC, PHURINE, GLUCOSEU, HGBUR, BILIRUBINUR, KETONESUR, PROTEINUR, UROBILINOGEN, NITRITE, LEUKOCYTESUR in the last 72 hours.  Invalid input(s): APPERANCEUR    Imaging: DG Abd 1 View  Result Date: 08/31/2020 CLINICAL DATA:  Orogastric tube placement. EXAM: ABDOMEN - 1 VIEW COMPARISON:  08/27/2020 FINDINGS: An enteric tube now projects over the proximal portion of the stomach with tip in the region of the gastric fundus (previously in the distal stomach). There is mild diffuse gaseous distension of small bowel loops in the included portion of the abdomen, and mild colonic gaseous distension is also evident. IMPRESSION: 1. Enteric tube in the proximal stomach. 2. Mild diffuse gaseous bowel distension which could reflect mild ileus. Electronically Signed   By: Sebastian Ache M.D.   On: 08/31/2020 16:39   DG CHEST PORT 1 VIEW  Result Date: 08/31/2020 CLINICAL DATA:  Pulmonary edema. EXAM: PORTABLE CHEST 1 VIEW COMPARISON:  08/28/2020. FINDINGS: Endotracheal tube, NG tube, left IJ line stable position. Heart size normal. Low lung volumes with bibasilar atelectasis. Diffuse bilateral pulmonary infiltrates/edema again noted without interim change. Moderate right pleural effusion. Small left pleural effusion. Similar findings noted on prior exam. No pneumothorax. IMPRESSION: 1. Lines and tubes in stable position. 2. Low lung volumes with bibasilar atelectasis. Diffuse bilateral pulmonary infiltrates/edema again noted without interim change.  Moderate right pleural effusion. Small left pleural effusion. Electronically Signed   By: Maisie Fus  Register   On: 08/31/2020 05:12     Medications:   . feeding supplement (VITAL 1.5 CAL) Stopped (08/31/20 1600)  . fentaNYL infusion INTRAVENOUS 175 mcg/hr (09-19-20 0600)  . heparin 800 Units/hr (09-19-2020 0600)  . norepinephrine (LEVOPHED) Adult infusion 30 mcg/min (09-19-2020 0725)  . propofol (DIPRIVAN) infusion 35 mcg/kg/min (2020-09-19 0609)   . amiodarone  200 mg Per Tube Daily  . atorvastatin  40 mg Per Tube Daily  . calcitRIOL  4 mcg Per Tube BID  . calcium-vitamin D  1 tablet Per Tube TID  . chlorhexidine gluconate (MEDLINE KIT)  15 mL Mouth Rinse BID  . Chlorhexidine Gluconate Cloth  6 each Topical Daily  . Chlorhexidine Gluconate Cloth  6 each Topical Daily  . Chlorhexidine Gluconate Cloth  6 each Topical Q0600  . [START ON 09/02/2020] Darbepoetin Alfa  100 mcg Subcutaneous Q Sat-1800  . docusate  100 mg Per Tube BID  . feeding supplement (PROSource TF)  45 mL Per Tube BID  . heparin sodium (porcine)      . insulin aspart  0-9 Units Subcutaneous Q4H  . isosorbide-hydrALAZINE  1 tablet Oral TID  . mouth rinse  15 mL Mouth Rinse 10 times  per day  . metoCLOPramide (REGLAN) injection  10 mg Intravenous Q8H  . pantoprazole sodium  40 mg Per Tube Daily  . polyethylene glycol  17 g Per Tube Daily  . sevelamer carbonate  800 mg Per Tube TID WC  . sodium chloride flush  10-40 mL Intracatheter Q12H  . tacrolimus  5 mg Per Tube BID  . vitamin B-12  1,000 mcg Per Tube Daily   fentaNYL, heparin, hydrALAZINE, hydrocortisone cream, ipratropium-albuterol, midazolam, senna, sodium chloride flush, witch hazel-glycerin  Assessment/ Plan:   Acute kidney injury on CKD.   Baseline creatinine widely fluctuating between 2.5 and 3.5.  Renal ultrasound performed 07/06/2020 was essentially unremarkable.  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  May have had progression of CKD progression vs AKI from covid as well as possible cardiorenal syndrome as worsening HF.  nontunneled catheter with critical care on 2/24 right fem   She has had a decline and no longer stable for HD as 35 mcg levo - transition to CRRT.  To start net neg 50/hr as tolerated  Anemia CKD.  Now appears to have some pancytopenia.  Hematology was called per charting.  Bone marrow biopsy performed on 2/18.  Continue ESA therapy. would recommend transfusion as needed. Increase aranesp to 100 mcg every sat  Immunosuppression in the face of Covid pneumonia severe infection and pancytopenia.    Holding CellCept at this time. Continue Prograf and steroids.  Prograf dose 5 mg twice daily. Can restart cellcept as she improves  Covid pneumonia as per primary service  Hypotension - on levo per critical care   Hypocalcemia continue vitamin D and calcium. On calcitriol. Note this was increased and may need to be decreased pending her course.  Continue IV repletion as ordered.  Hypocalcemia may be related to renal failure.  PTH 1073 on 2/22 - improved from prior of 1548 on 08/03/20.  Hypokalemia, hypomag - repleted.  Anticipate will need to reduce activated vit D once starts HD   Claudia Desanctis, MD 09-08-2020 8:13 AM

## 2020-09-05 NOTE — Progress Notes (Signed)
Patient became hypotensive despite Levophed 60 mics and vasopressin.  Slowly she became bradycardic, she received 1 mg of IV epinephrine push with improvement in heart rate.  Also patient was noted to be hypoglycemic with fingerstick initially 42, she received D50 x1, repeat fingerstick was 146, after 1 hour repeat fingerstick was 16, patient received 2 A of D50. Again patient became bradycardic and lost her pulse.  She is DNR so no rest resuscitation was performed, after 1 minute patient regained her pulse, continue to remain hypotensive. Bedside echocardiogram was performed shows thickened RV and LV with preserved function.  Moderate amount of pericardial effusion, with no tamponade.  Patient's family was informed and they are at bedside to visit her.  Total critical care time: 55 minutes  Performed by: [place] care time was exclusive of separately billable procedures and treating other patients.   Critical care was necessary to treat or prevent imminent or life-threatening deterioration.   Critical care was time spent personally by me on the following activities: development of treatment plan with patient and/or surrogate as well as nursing, discussions with consultants, evaluation of patient's response to treatment, examination of patient, obtaining history from patient or surrogate, ordering and performing treatments and interventions, ordering and review of laboratory studies, ordering and review of radiographic studies, pulse oximetry and re-evaluation of patient's condition.   Jacky Kindle MD [facility] See Amion for pager If no response to pager, please call [phone] until 7pm After 7pm, Please call E-link [phone]

## 2020-09-05 NOTE — Progress Notes (Signed)
eLink Physician-Brief Progress Note Patient Name: [name] DOB: [birth year] MRN: [id number]   Date of Service  09/04/2020  HPI/Events of Note  Patient with sub-optimal sedation on the ventilator, elevated blood pressure (she is not on her home anti-hypertensive medications), she also has copious respiratory secretions.  eICU Interventions  Fentanyl ceiling increased to 300 mcg, PRN Versed added, home Bidil anti-hypertensive medication resumed, and respiratory secretions sent for culture and sensitivity.        Kerry Kass [place] 09/04/20, 12:52 AM

## 2020-09-05 NOTE — Progress Notes (Addendum)
ANTICOAGULATION CONSULT NOTE - Follow Up Consult  Pharmacy Consult for heparin Indication: h/o VTE and PAF  Labs: Recent Labs     0000 08/29/20 2200 08/30/20 0351 08/30/20 0700 08/30/20 0804 08/30/20 1713 08/31/20 0501 Sep 14, 2020 0355 09/14/20 1300  HGB   < >  --  8.2*  --   --   --  8.1* 8.5*  --   HCT  --   --  25.2*  --   --   --  26.2* 28.6*  --   PLT  --   --  156  --   --   --  133* 117*  --   APTT  --  65*  --   --  53* 58*  --   --   --   LABPROT  --   --  15.7*  --   --   --  15.0 15.1  --   INR  --   --  1.3*  --   --   --  1.2 1.2  --   HEPARINUNFRC  --   --   --    < >  --  0.50 0.41 0.21* 0.27*  CREATININE  --   --  3.89*  --   --   --  3.85* 4.11*  --    < > = values in this interval not displayed.    Assessment: 55 yo female on heparin infusion who takes Eliquis 5mg  BID PTA for afib. 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 Her last dose of Eliquis was given on 2/20 @0802 .   Heparin level slightly subtherapeutic s/p rate increase to 800 units/hr, starting CRRT today  Goal of Therapy:  Heparin level 0.3-0.7 units/ml   Plan:  Increase heparin gtt to 900 units/hr F/u 8 hour heparin level  Bertis Ruddy, PharmD Clinical Pharmacist Please check AMION for all [place] numbers 2020-09-14 2:36 PM

## 2020-09-05 NOTE — Progress Notes (Signed)
150 mL of Fentanyl IVPB wasted with Kem Kays, RN.

## 2020-09-05 NOTE — Progress Notes (Signed)
Inpatient Diabetes Program Recommendations  AACE/ADA: New Consensus Statement on Inpatient Glycemic Control (2015)  Target Ranges:  Prepandial:   less than 140 mg/dL      Peak postprandial:   less than 180 mg/dL (1-2 hours)      Critically ill patients:  140 - 180 mg/dL   Lab Results  Component Value Date   GLUCAP 142 (H) 2020/09/20   HGBA1C 5.6 07/05/2020    Review of Glycemic Control Results for KANIJA, REMMEL (MRN [id number]) as of 09/20/2020 15:32  Ref. Range 2020-09-20 12:56 2020-09-20 13:10  Glucose-Capillary Latest Ref Range: 70 - 99 mg/dL 52 (L) 142 (H)    Inpatient Diabetes Program Recommendations:    Given hypoglycemia and renal status, consider reducing correction to Novolog 0-6 units Q4H.   Thanks, Bronson Curb, MSN, RNC-OB Diabetes Coordinator [phone] (8a-5p)

## 2020-09-05 NOTE — Progress Notes (Signed)
Patient lost a pulse, asystole noted on cardiac monitor. Patient DNR, MD called. MD instructed RN to pronounce death x2 RNs.

## 2020-09-05 NOTE — Progress Notes (Signed)
Dr. Tacy Learn paged, patient in ventricular tachycardia with a pulse. Orders for amiodarone bolus given.   Dr. Royce Macadamia with renal paged and informed about ventricular tachycardia, agreed with RN that CRRT needed to be stopped at this time. Blood returned, CRRT off.

## 2020-09-05 NOTE — Progress Notes (Signed)
OT Cancellation Note  Patient Details Name: [name] MRN: [id number] DOB: 1966/06/26   Cancelled Treatment:    Reason Eval/Treat Not Completed: Patient not medically ready. Sedation increased. Pt remains ventilated with increased BP. Will check back next week for medical readiness.   Ramond Dial, OT/L   Acute OT Clinical Specialist Acute Rehabilitation Services Pager [phone] Office [phone]  09-27-2020, 7:51 AM

## 2020-09-05 DEATH — deceased

## 2020-09-12 ENCOUNTER — Encounter: Payer: Medicare Other | Admitting: Internal Medicine

## 2020-10-12 ENCOUNTER — Other Ambulatory Visit: Payer: Medicare Other

## 2020-10-12 ENCOUNTER — Ambulatory Visit: Payer: Medicare Other | Admitting: Hematology and Oncology

## 2022-06-04 IMAGING — MR MR PELVIS W/O CM
4 of 5 series · 19 of 48 positions shown · non-contrast
Comparison: CT scan 03/17/2020

CLINICAL DATA: Followup abnormal CT scan. Iliopsoas process on the
right noted.

EXAM:
MRI PELVIS WITHOUT CONTRAST
TECHNIQUE: Multiplanar multisequence MR imaging of the pelvis was performed. No
intravenous contrast was administered.

[Series 5: STIR · coronal · 5.0mm · 0.86mm/px · 8 of 37 slices shown]
[im 1/37]
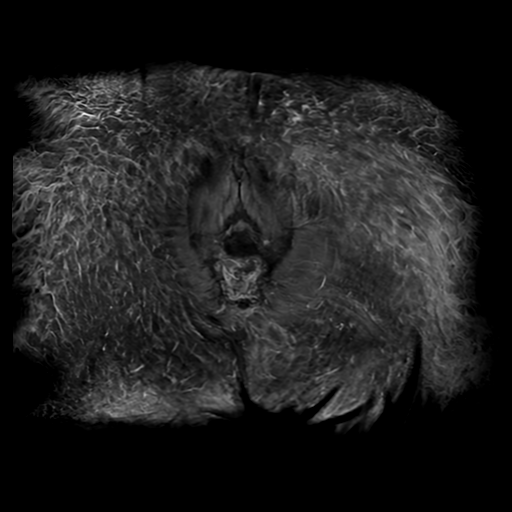
[im 6/37]
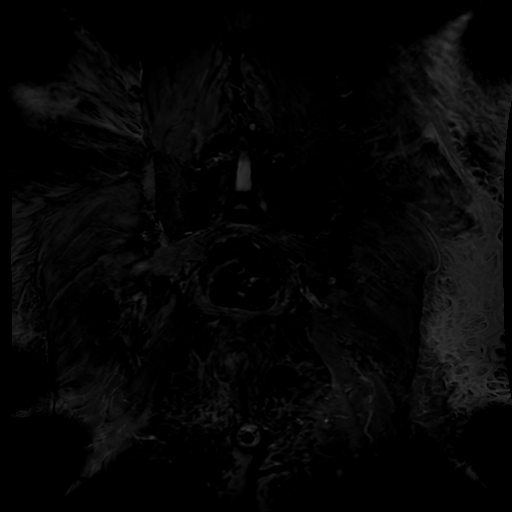
[im 11/37]
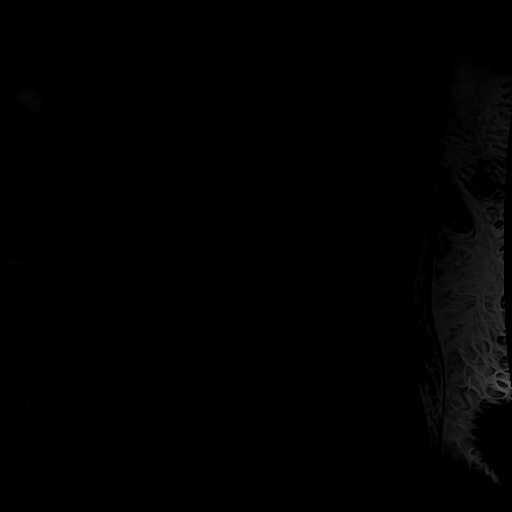
[im 16/37]
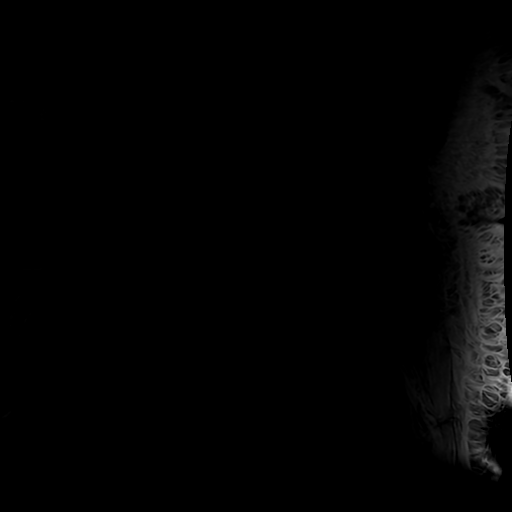
[im 21/37]
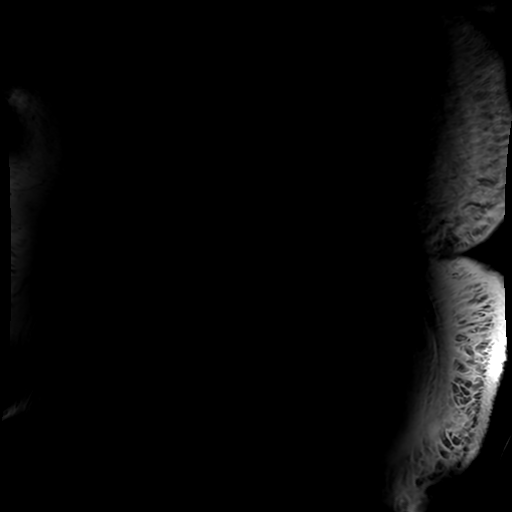
[im 26/37]
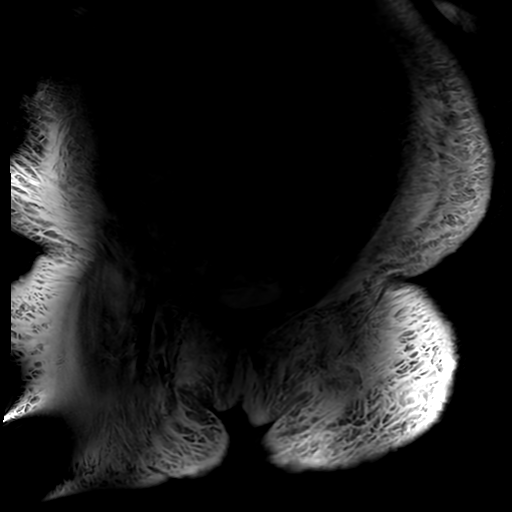
[im 31/37]
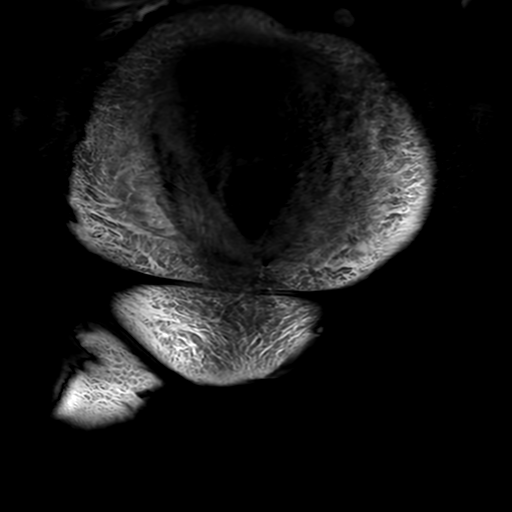
[im 37/37]
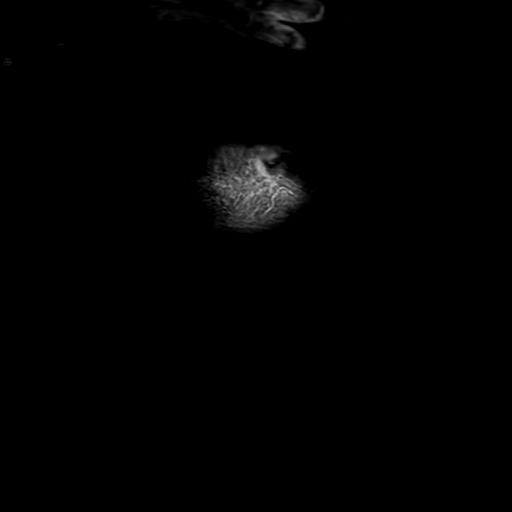

[Series 6: T1 · coronal · 5.0mm · 0.86mm/px · 5 of 37 slices shown (1 of 2)]
[im 1/37]
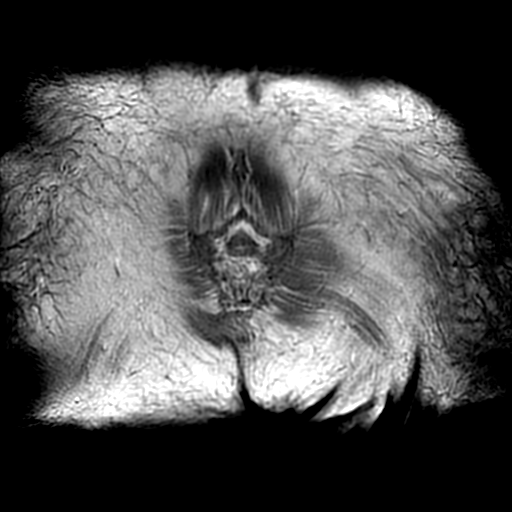
[im 5/37]
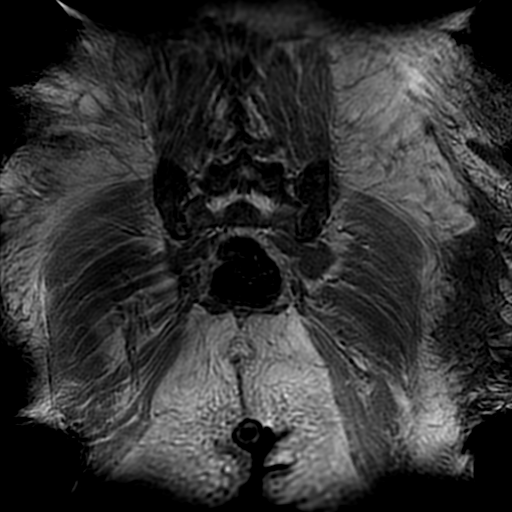
[im 10/37]
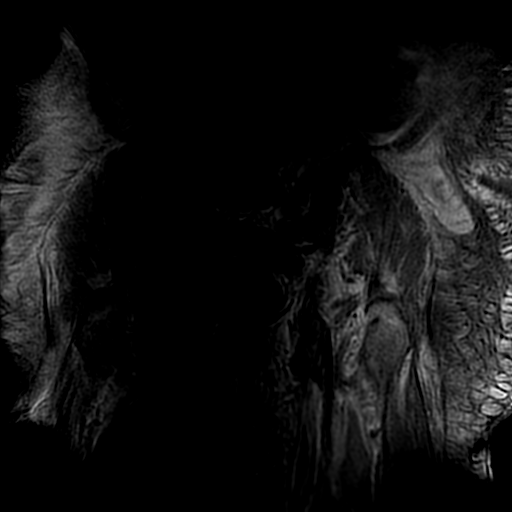
[im 19/37]
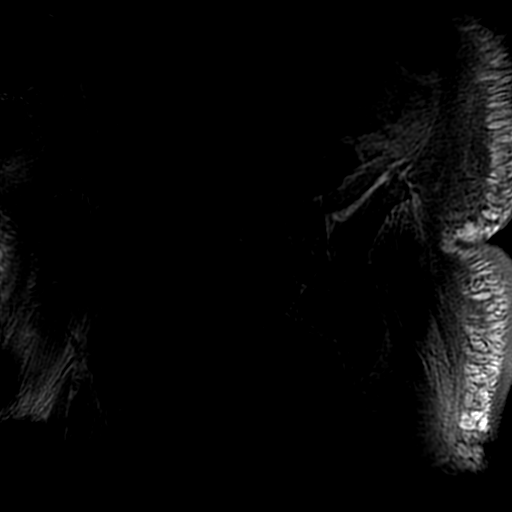
[im 32/37]
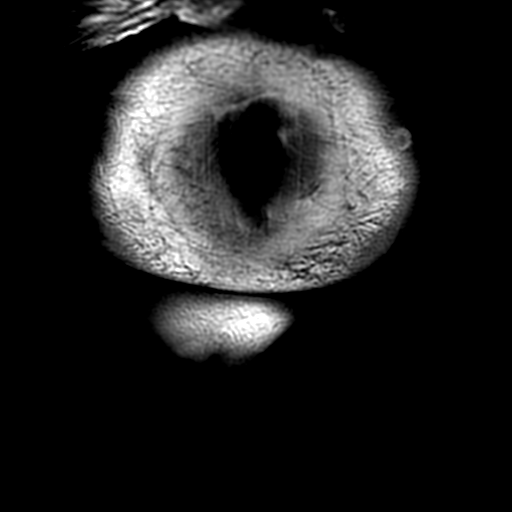

[Series 8: T2 fat-sat · axial · 5.0mm · 0.82mm/px · z∈[-191,+1]mm · 3 of 42 slices shown]
[im 5/42]
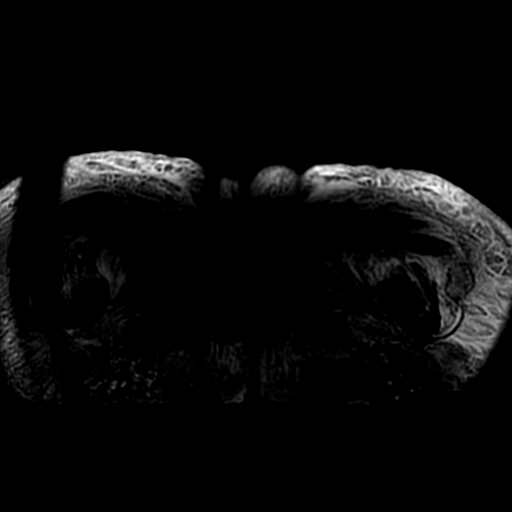
[im 23/42]
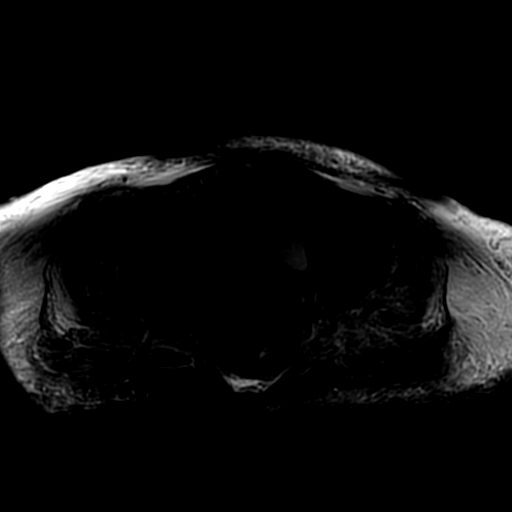
[im 37/42]
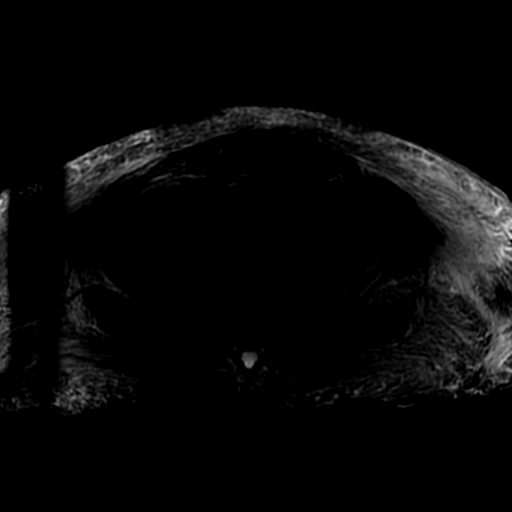

[Series 9: T1 · axial · 5.0mm · 0.82mm/px · z∈[-194,-32]mm · 3 of 37 slices shown (2 of 2)]
[im 5/37]
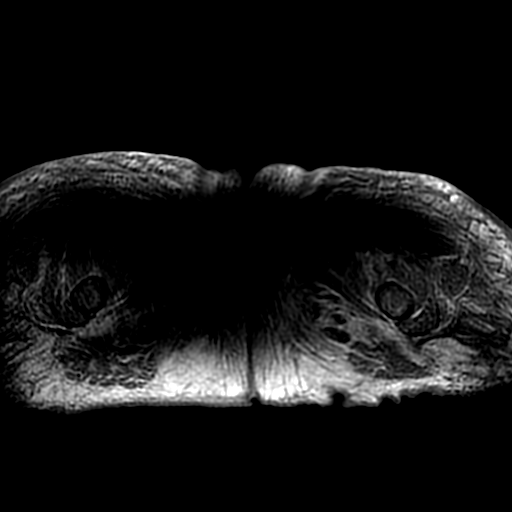
[im 19/37]
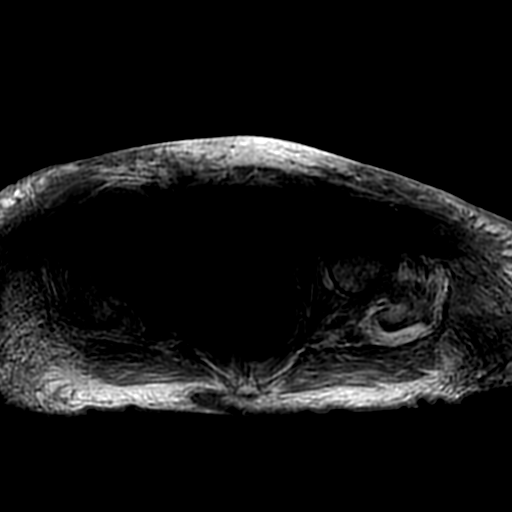
[im 32/37]
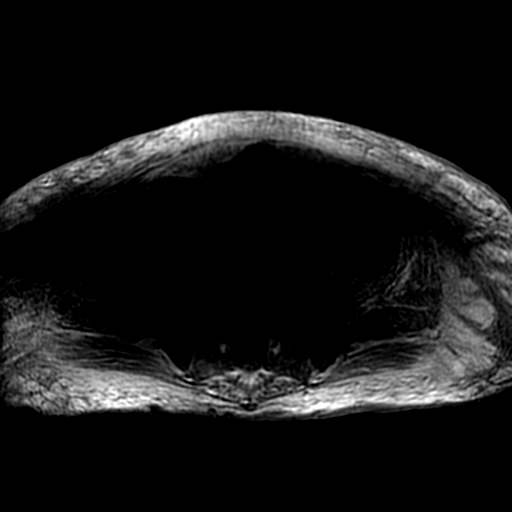

[19 of 48 positions shown; findings below may reference images not displayed]

FINDINGS: Examination is severely limited by body habitus and patient motion.

Urinary Tract:  The bladder is grossly normal.

Bowel: Moderate stool distended rectum could suggest fecal
impaction.

Vascular/Lymphatic: No gross abnormality.

Reproductive:  The uterus and ovaries are grossly normal.

Other: Small amount of presacral edema and diffuse and severe body
wall edema along with diffuse and severe myositis.

Musculoskeletal: There are small bilateral hip joint effusions,
right greater than left but I do not see any destructive bony
changes or marrow edema to suggest septic arthritis or
osteomyelitis. The SI joints appear to be grossly intact and the
pubic symphysis is intact.

As demonstrated on the CT scan there is a large complex fluid
collection associated with the right iliopsoas bursa. This is areas
of dark T2 signal intensity which I think correlate with
calcifications on the CT scan. Findings most likely severe chronic
calcific bursitis. Difficult to identified the iliopsoas tendon for
certain but I do not see any secondary signs that it is ruptured.
IMPRESSION: 1. Severely limited examination due to body habitus and patient
motion.
2. Severe chronic calcific bursitis involving the right iliopsoas
bursa.
3. No findings for septic arthritis or osteomyelitis.
4. Diffuse and severe myositis.  And severe diffuse body wall edema.

## 2022-09-16 IMAGING — DX DG CHEST 1V PORT
1 series · 1 of 1 positions shown · non-contrast
Comparison: 03/16/2020

CLINICAL DATA: Short of breath, sepsis

EXAM:
PORTABLE CHEST 1 VIEW

[chest]
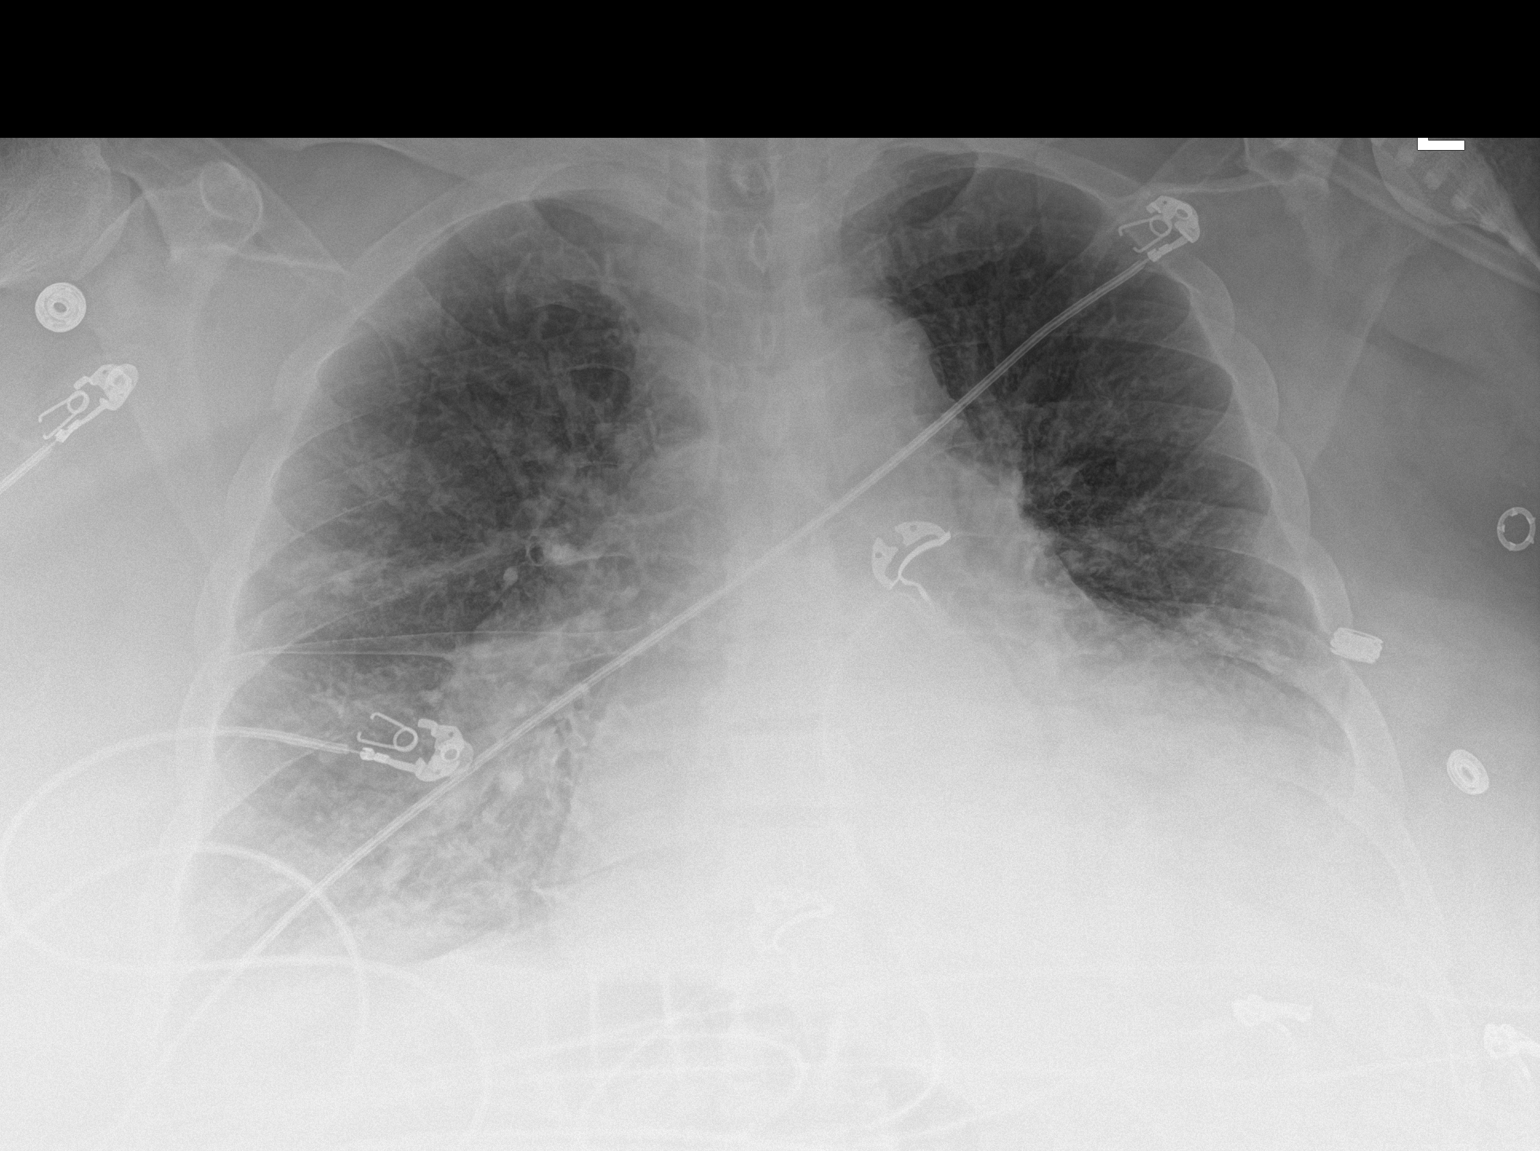

[1 of 1 positions shown; findings below may reference images not displayed]

FINDINGS: Single frontal view of the chest demonstrates an enlarged cardiac
silhouette. There is central vascular congestion, with increased
interstitial prominence, bibasilar consolidation, and bilateral
effusions. Changes are more pronounced on the left. No pneumothorax.
IMPRESSION: 1. Findings most consistent with congestive heart failure.
Superimposed infection would be difficult to exclude.
